# Patient Record
Sex: Female | Born: 1970 | Race: Black or African American | Hispanic: No | Marital: Single | Smoking: Current every day smoker
Health system: Southern US, Community
[De-identification: ages and names within clinical notes are randomized; demographics above are authoritative.]

## PROBLEM LIST (undated history)

## (undated) ENCOUNTER — Emergency Department (HOSPITAL_COMMUNITY): Admission: EM | Payer: Self-pay | Source: Home / Self Care

## (undated) DIAGNOSIS — C50919 Malignant neoplasm of unspecified site of unspecified female breast: Secondary | ICD-10-CM

## (undated) DIAGNOSIS — G459 Transient cerebral ischemic attack, unspecified: Secondary | ICD-10-CM

## (undated) DIAGNOSIS — Z609 Problem related to social environment, unspecified: Secondary | ICD-10-CM

## (undated) DIAGNOSIS — I5042 Chronic combined systolic (congestive) and diastolic (congestive) heart failure: Secondary | ICD-10-CM

## (undated) DIAGNOSIS — N182 Chronic kidney disease, stage 2 (mild): Secondary | ICD-10-CM

## (undated) DIAGNOSIS — I4892 Unspecified atrial flutter: Secondary | ICD-10-CM

## (undated) DIAGNOSIS — F1490 Cocaine use, unspecified, uncomplicated: Secondary | ICD-10-CM

## (undated) DIAGNOSIS — J449 Chronic obstructive pulmonary disease, unspecified: Secondary | ICD-10-CM

## (undated) DIAGNOSIS — Z5900 Homelessness unspecified: Secondary | ICD-10-CM

## (undated) DIAGNOSIS — I10 Essential (primary) hypertension: Secondary | ICD-10-CM

## (undated) DIAGNOSIS — F1910 Other psychoactive substance abuse, uncomplicated: Secondary | ICD-10-CM

## (undated) DIAGNOSIS — Z659 Problem related to unspecified psychosocial circumstances: Secondary | ICD-10-CM

## (undated) HISTORY — PX: TONSILLECTOMY AND ADENOIDECTOMY: SUR1326

## (undated) HISTORY — DX: Malignant neoplasm of unspecified site of unspecified female breast: C50.919

---
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
## 1991-01-18 HISTORY — PX: TUBAL LIGATION: SHX77

## 1997-04-17 ENCOUNTER — Emergency Department (HOSPITAL_COMMUNITY): Admission: EM | Admit: 1997-04-17 | Discharge: 1997-04-17 | Payer: Self-pay | Admitting: Emergency Medicine

## 1999-01-12 ENCOUNTER — Emergency Department (HOSPITAL_COMMUNITY): Admission: EM | Admit: 1999-01-12 | Discharge: 1999-01-12 | Payer: Self-pay | Admitting: Emergency Medicine

## 1999-01-12 ENCOUNTER — Encounter: Payer: Self-pay | Admitting: Emergency Medicine

## 2004-03-19 ENCOUNTER — Emergency Department (HOSPITAL_COMMUNITY): Admission: EM | Admit: 2004-03-19 | Discharge: 2004-03-19 | Payer: Self-pay | Admitting: Emergency Medicine

## 2004-04-26 ENCOUNTER — Encounter: Admission: RE | Admit: 2004-04-26 | Discharge: 2004-04-26 | Payer: Self-pay | Admitting: Family Medicine

## 2004-04-28 ENCOUNTER — Encounter: Admission: RE | Admit: 2004-04-28 | Discharge: 2004-07-27 | Payer: Self-pay | Admitting: Family Medicine

## 2005-01-17 ENCOUNTER — Emergency Department (HOSPITAL_COMMUNITY): Admission: EM | Admit: 2005-01-17 | Discharge: 2005-01-17 | Payer: Self-pay | Admitting: Emergency Medicine

## 2005-01-17 HISTORY — PX: BREAST BIOPSY: SHX20

## 2005-09-17 HISTORY — PX: MASTECTOMY: SHX3

## 2006-04-18 ENCOUNTER — Emergency Department (HOSPITAL_COMMUNITY): Admission: EM | Admit: 2006-04-18 | Discharge: 2006-04-19 | Payer: Self-pay | Admitting: Emergency Medicine

## 2006-07-25 ENCOUNTER — Emergency Department (HOSPITAL_COMMUNITY): Admission: EM | Admit: 2006-07-25 | Discharge: 2006-07-25 | Payer: Self-pay | Admitting: Emergency Medicine

## 2006-09-10 ENCOUNTER — Emergency Department (HOSPITAL_COMMUNITY): Admission: EM | Admit: 2006-09-10 | Discharge: 2006-09-10 | Payer: Self-pay | Admitting: Emergency Medicine

## 2006-09-13 ENCOUNTER — Encounter: Admission: RE | Admit: 2006-09-13 | Discharge: 2006-09-13 | Payer: Self-pay | Admitting: Family Medicine

## 2006-09-19 ENCOUNTER — Encounter (INDEPENDENT_AMBULATORY_CARE_PROVIDER_SITE_OTHER): Payer: Self-pay | Admitting: Radiology

## 2006-09-19 ENCOUNTER — Encounter: Admission: RE | Admit: 2006-09-19 | Discharge: 2006-09-19 | Payer: Self-pay | Admitting: Family Medicine

## 2006-09-24 ENCOUNTER — Encounter: Admission: RE | Admit: 2006-09-24 | Discharge: 2006-09-24 | Payer: Self-pay | Admitting: Family Medicine

## 2006-09-28 ENCOUNTER — Encounter (INDEPENDENT_AMBULATORY_CARE_PROVIDER_SITE_OTHER): Payer: Self-pay | Admitting: General Surgery

## 2006-09-28 ENCOUNTER — Encounter: Admission: RE | Admit: 2006-09-28 | Discharge: 2006-09-28 | Payer: Self-pay | Admitting: General Surgery

## 2006-10-16 ENCOUNTER — Ambulatory Visit (HOSPITAL_COMMUNITY): Admission: RE | Admit: 2006-10-16 | Discharge: 2006-10-17 | Payer: Self-pay | Admitting: General Surgery

## 2006-10-16 ENCOUNTER — Encounter (INDEPENDENT_AMBULATORY_CARE_PROVIDER_SITE_OTHER): Payer: Self-pay | Admitting: General Surgery

## 2006-10-27 ENCOUNTER — Ambulatory Visit: Payer: Self-pay | Admitting: Oncology

## 2006-11-08 LAB — CBC WITH DIFFERENTIAL/PLATELET
Basophils Absolute: 0.1 10*3/uL (ref 0.0–0.1)
EOS%: 1.9 % (ref 0.0–7.0)
Eosinophils Absolute: 0.1 10*3/uL (ref 0.0–0.5)
HGB: 11.6 g/dL (ref 11.6–15.9)
LYMPH%: 37.6 % (ref 14.0–48.0)
MCH: 29 pg (ref 26.0–34.0)
MCV: 85.7 fL (ref 81.0–101.0)
MONO%: 8.5 % (ref 0.0–13.0)
NEUT#: 3.8 10*3/uL (ref 1.5–6.5)
Platelets: 491 10*3/uL — ABNORMAL HIGH (ref 145–400)
RBC: 4.01 10*6/uL (ref 3.70–5.32)
RDW: 12 % (ref 11.3–14.5)

## 2006-11-08 LAB — CANCER ANTIGEN 27.29: CA 27.29: 10 U/mL (ref 0–39)

## 2006-11-08 LAB — COMPREHENSIVE METABOLIC PANEL
AST: 47 U/L — ABNORMAL HIGH (ref 0–37)
Alkaline Phosphatase: 63 U/L (ref 39–117)
BUN: 16 mg/dL (ref 6–23)
Glucose, Bld: 109 mg/dL — ABNORMAL HIGH (ref 70–99)
Total Bilirubin: 0.3 mg/dL (ref 0.3–1.2)

## 2006-11-13 LAB — VITAMIN D PNL(25-HYDRXY+1,25-DIHY)-BLD
Vit D, 1,25-Dihydroxy: 55 pg/mL (ref 6–62)
Vit D, 25-Hydroxy: 12 ng/mL — ABNORMAL LOW (ref 30–89)

## 2006-11-15 ENCOUNTER — Ambulatory Visit: Payer: Self-pay | Admitting: Cardiology

## 2006-11-15 ENCOUNTER — Ambulatory Visit: Admission: RE | Admit: 2006-11-15 | Discharge: 2006-11-15 | Payer: Self-pay | Admitting: Oncology

## 2006-11-15 ENCOUNTER — Encounter: Payer: Self-pay | Admitting: Oncology

## 2006-11-21 ENCOUNTER — Ambulatory Visit (HOSPITAL_COMMUNITY): Admission: RE | Admit: 2006-11-21 | Discharge: 2006-11-21 | Payer: Self-pay | Admitting: Oncology

## 2006-12-06 LAB — CBC WITH DIFFERENTIAL/PLATELET
BASO%: 1.7 % (ref 0.0–2.0)
Eosinophils Absolute: 0.1 10*3/uL (ref 0.0–0.5)
LYMPH%: 43.1 % (ref 14.0–48.0)
MCHC: 34.4 g/dL (ref 32.0–36.0)
MONO#: 0.7 10*3/uL (ref 0.1–0.9)
MONO%: 9.6 % (ref 0.0–13.0)
NEUT#: 3.4 10*3/uL (ref 1.5–6.5)
Platelets: 429 10*3/uL — ABNORMAL HIGH (ref 145–400)
RBC: 4.12 10*6/uL (ref 3.70–5.32)
RDW: 15.1 % — ABNORMAL HIGH (ref 11.3–14.5)
WBC: 7.6 10*3/uL (ref 3.9–10.0)

## 2006-12-06 LAB — MORPHOLOGY

## 2006-12-06 LAB — CHCC SMEAR

## 2006-12-12 ENCOUNTER — Ambulatory Visit: Payer: Self-pay | Admitting: Oncology

## 2006-12-19 LAB — CBC WITH DIFFERENTIAL/PLATELET
BASO%: 0.9 % (ref 0.0–2.0)
EOS%: 1.2 % (ref 0.0–7.0)
HCT: 35.3 % (ref 34.8–46.6)
LYMPH%: 34.9 % (ref 14.0–48.0)
MCH: 28.5 pg (ref 26.0–34.0)
MCHC: 33.3 g/dL (ref 32.0–36.0)
NEUT%: 54.5 % (ref 39.6–76.8)
Platelets: 477 10*3/uL — ABNORMAL HIGH (ref 145–400)
RBC: 4.12 10*6/uL (ref 3.70–5.32)
WBC: 7.8 10*3/uL (ref 3.9–10.0)

## 2006-12-26 LAB — CBC WITH DIFFERENTIAL/PLATELET
BASO%: 0.8 % (ref 0.0–2.0)
EOS%: 1.3 % (ref 0.0–7.0)
MCH: 28.4 pg (ref 26.0–34.0)
MCHC: 33.7 g/dL (ref 32.0–36.0)
MONO#: 0.4 10*3/uL (ref 0.1–0.9)
RBC: 3.56 10*6/uL — ABNORMAL LOW (ref 3.70–5.32)
RDW: 11.7 % (ref 11.3–14.5)
WBC: 8.3 10*3/uL (ref 3.9–10.0)
lymph#: 2.6 10*3/uL (ref 0.9–3.3)

## 2007-01-02 LAB — CBC WITH DIFFERENTIAL/PLATELET
Basophils Absolute: 0.1 10*3/uL (ref 0.0–0.1)
EOS%: 0.4 % (ref 0.0–7.0)
Eosinophils Absolute: 0 10*3/uL (ref 0.0–0.5)
HGB: 11 g/dL — ABNORMAL LOW (ref 11.6–15.9)
MCH: 28.5 pg (ref 26.0–34.0)
MONO#: 1.1 10*3/uL — ABNORMAL HIGH (ref 0.1–0.9)
NEUT#: 7.4 10*3/uL — ABNORMAL HIGH (ref 1.5–6.5)
RDW: 12.4 % (ref 11.3–14.5)
WBC: 11.2 10*3/uL — ABNORMAL HIGH (ref 3.9–10.0)
lymph#: 2.6 10*3/uL (ref 0.9–3.3)

## 2007-01-16 LAB — CBC WITH DIFFERENTIAL/PLATELET
BASO%: 0.7 % (ref 0.0–2.0)
Eosinophils Absolute: 0 10*3/uL (ref 0.0–0.5)
MONO#: 1.6 10*3/uL — ABNORMAL HIGH (ref 0.1–0.9)
NEUT#: 12.1 10*3/uL — ABNORMAL HIGH (ref 1.5–6.5)
Platelets: 305 10*3/uL (ref 145–400)
RBC: 3.91 10*6/uL (ref 3.70–5.32)
RDW: 13.5 % (ref 11.3–14.5)
WBC: 16.1 10*3/uL — ABNORMAL HIGH (ref 3.9–10.0)
lymph#: 2.2 10*3/uL (ref 0.9–3.3)

## 2007-01-17 ENCOUNTER — Ambulatory Visit: Payer: Self-pay | Admitting: Oncology

## 2007-01-22 ENCOUNTER — Encounter: Admission: RE | Admit: 2007-01-22 | Discharge: 2007-01-22 | Payer: Self-pay | Admitting: Dentistry

## 2007-01-22 ENCOUNTER — Ambulatory Visit: Payer: Self-pay | Admitting: Dentistry

## 2007-01-23 LAB — CBC WITH DIFFERENTIAL/PLATELET
Eosinophils Absolute: 0 10*3/uL (ref 0.0–0.5)
HCT: 32.5 % — ABNORMAL LOW (ref 34.8–46.6)
HGB: 10.8 g/dL — ABNORMAL LOW (ref 11.6–15.9)
LYMPH%: 27.4 % (ref 14.0–48.0)
MONO#: 1.2 10*3/uL — ABNORMAL HIGH (ref 0.1–0.9)
NEUT#: 4.2 10*3/uL (ref 1.5–6.5)
NEUT%: 55.6 % (ref 39.6–76.8)
Platelets: 309 10*3/uL (ref 145–400)
WBC: 7.6 10*3/uL (ref 3.9–10.0)
lymph#: 2.1 10*3/uL (ref 0.9–3.3)

## 2007-01-30 LAB — CBC WITH DIFFERENTIAL/PLATELET
BASO%: 0.4 % (ref 0.0–2.0)
Basophils Absolute: 0 10*3/uL (ref 0.0–0.1)
EOS%: 0.3 % (ref 0.0–7.0)
HCT: 27.6 % — ABNORMAL LOW (ref 34.8–46.6)
HGB: 9.4 g/dL — ABNORMAL LOW (ref 11.6–15.9)
LYMPH%: 21.4 % (ref 14.0–48.0)
MCH: 29.1 pg (ref 26.0–34.0)
MCHC: 34.2 g/dL (ref 32.0–36.0)
MONO#: 0.3 10*3/uL (ref 0.1–0.9)
NEUT%: 75.1 % (ref 39.6–76.8)
Platelets: 425 10*3/uL — ABNORMAL HIGH (ref 145–400)
lymph#: 2.3 10*3/uL (ref 0.9–3.3)

## 2007-02-07 LAB — CBC WITH DIFFERENTIAL/PLATELET
BASO%: 0.7 % (ref 0.0–2.0)
Basophils Absolute: 0.1 10*3/uL (ref 0.0–0.1)
EOS%: 0.4 % (ref 0.0–7.0)
HCT: 31 % — ABNORMAL LOW (ref 34.8–46.6)
HGB: 10.4 g/dL — ABNORMAL LOW (ref 11.6–15.9)
LYMPH%: 13.4 % — ABNORMAL LOW (ref 14.0–48.0)
MCH: 29 pg (ref 26.0–34.0)
MCHC: 33.7 g/dL (ref 32.0–36.0)
MCV: 86 fL (ref 81.0–101.0)
MONO%: 10.7 % (ref 0.0–13.0)
NEUT%: 74.8 % (ref 39.6–76.8)
lymph#: 1.5 10*3/uL (ref 0.9–3.3)

## 2007-02-13 LAB — CBC WITH DIFFERENTIAL/PLATELET
BASO%: 0.3 % (ref 0.0–2.0)
Basophils Absolute: 0 10*3/uL (ref 0.0–0.1)
EOS%: 0.2 % (ref 0.0–7.0)
MCH: 29.4 pg (ref 26.0–34.0)
MCHC: 34.1 g/dL (ref 32.0–36.0)
MCV: 86.4 fL (ref 81.0–101.0)
MONO%: 0.8 % (ref 0.0–13.0)
RBC: 3.4 10*6/uL — ABNORMAL LOW (ref 3.70–5.32)
RDW: 15.3 % — ABNORMAL HIGH (ref 11.3–14.5)
lymph#: 1.5 10*3/uL (ref 0.9–3.3)

## 2007-02-13 LAB — CANCER ANTIGEN 27.29: CA 27.29: 17 U/mL (ref 0–39)

## 2007-02-13 LAB — COMPREHENSIVE METABOLIC PANEL
Albumin: 4.5 g/dL (ref 3.5–5.2)
CO2: 25 mEq/L (ref 19–32)
Glucose, Bld: 90 mg/dL (ref 70–99)
Sodium: 141 mEq/L (ref 135–145)
Total Bilirubin: 0.6 mg/dL (ref 0.3–1.2)
Total Protein: 7.1 g/dL (ref 6.0–8.3)

## 2007-02-27 ENCOUNTER — Emergency Department (HOSPITAL_COMMUNITY): Admission: EM | Admit: 2007-02-27 | Discharge: 2007-02-27 | Payer: Self-pay | Admitting: Emergency Medicine

## 2007-03-01 ENCOUNTER — Ambulatory Visit (HOSPITAL_COMMUNITY): Admission: RE | Admit: 2007-03-01 | Discharge: 2007-03-01 | Payer: Self-pay | Admitting: Dentistry

## 2007-03-01 ENCOUNTER — Ambulatory Visit: Payer: Self-pay | Admitting: Dentistry

## 2007-03-15 ENCOUNTER — Ambulatory Visit: Payer: Self-pay | Admitting: Oncology

## 2007-03-29 LAB — COMPREHENSIVE METABOLIC PANEL
ALT: 28 U/L (ref 0–35)
AST: 70 U/L — ABNORMAL HIGH (ref 0–37)
Albumin: 4.7 g/dL (ref 3.5–5.2)
BUN: 8 mg/dL (ref 6–23)
CO2: 25 mEq/L (ref 19–32)
Calcium: 9.5 mg/dL (ref 8.4–10.5)
Chloride: 102 mEq/L (ref 96–112)
Creatinine, Ser: 0.94 mg/dL (ref 0.40–1.20)
Potassium: 4.2 mEq/L (ref 3.5–5.3)

## 2007-03-29 LAB — CBC WITH DIFFERENTIAL/PLATELET
Basophils Absolute: 0.1 10*3/uL (ref 0.0–0.1)
EOS%: 3.8 % (ref 0.0–7.0)
Eosinophils Absolute: 0.2 10*3/uL (ref 0.0–0.5)
HCT: 37.8 % (ref 34.8–46.6)
HGB: 12.5 g/dL (ref 11.6–15.9)
MCH: 30.3 pg (ref 26.0–34.0)
MONO#: 0.5 10*3/uL (ref 0.1–0.9)
NEUT#: 2.6 10*3/uL (ref 1.5–6.5)
NEUT%: 48.8 % (ref 39.6–76.8)
RDW: 14.8 % — ABNORMAL HIGH (ref 11.3–14.5)
WBC: 5.3 10*3/uL (ref 3.9–10.0)
lymph#: 1.9 10*3/uL (ref 0.9–3.3)

## 2007-03-29 LAB — CANCER ANTIGEN 27.29: CA 27.29: 10 U/mL (ref 0–39)

## 2007-05-18 ENCOUNTER — Ambulatory Visit: Payer: Self-pay | Admitting: Oncology

## 2007-05-22 LAB — COMPREHENSIVE METABOLIC PANEL
BUN: 17 mg/dL (ref 6–23)
CO2: 24 mEq/L (ref 19–32)
Calcium: 9.4 mg/dL (ref 8.4–10.5)
Chloride: 105 mEq/L (ref 96–112)
Creatinine, Ser: 1.09 mg/dL (ref 0.40–1.20)
Glucose, Bld: 77 mg/dL (ref 70–99)
Total Bilirubin: 0.6 mg/dL (ref 0.3–1.2)

## 2007-05-22 LAB — CBC WITH DIFFERENTIAL/PLATELET
BASO%: 2 % (ref 0.0–2.0)
Basophils Absolute: 0.1 10*3/uL (ref 0.0–0.1)
HCT: 37.7 % (ref 34.8–46.6)
HGB: 12.6 g/dL (ref 11.6–15.9)
LYMPH%: 39.5 % (ref 14.0–48.0)
MCHC: 33.3 g/dL (ref 32.0–36.0)
MONO#: 0.6 10*3/uL (ref 0.1–0.9)
NEUT%: 47.3 % (ref 39.6–76.8)
Platelets: 452 10*3/uL — ABNORMAL HIGH (ref 145–400)
WBC: 5.4 10*3/uL (ref 3.9–10.0)
lymph#: 2.1 10*3/uL (ref 0.9–3.3)

## 2007-05-22 LAB — CANCER ANTIGEN 27.29: CA 27.29: 11 U/mL (ref 0–39)

## 2007-08-27 ENCOUNTER — Ambulatory Visit: Payer: Self-pay | Admitting: Oncology

## 2007-09-19 ENCOUNTER — Encounter: Admission: RE | Admit: 2007-09-19 | Discharge: 2007-09-19 | Payer: Self-pay | Admitting: Oncology

## 2007-11-08 ENCOUNTER — Emergency Department (HOSPITAL_COMMUNITY): Admission: EM | Admit: 2007-11-08 | Discharge: 2007-11-08 | Payer: Self-pay | Admitting: Emergency Medicine

## 2007-11-20 ENCOUNTER — Ambulatory Visit: Payer: Self-pay | Admitting: Oncology

## 2007-11-22 LAB — CBC WITH DIFFERENTIAL/PLATELET
Basophils Absolute: 0 10*3/uL (ref 0.0–0.1)
Eosinophils Absolute: 0 10*3/uL (ref 0.0–0.5)
HCT: 34.9 % (ref 34.8–46.6)
HGB: 11.6 g/dL (ref 11.6–15.9)
LYMPH%: 32 % (ref 14.0–48.0)
MCV: 88.4 fL (ref 81.0–101.0)
MONO#: 0.7 10*3/uL (ref 0.1–0.9)
NEUT#: 3.9 10*3/uL (ref 1.5–6.5)
NEUT%: 57.1 % (ref 39.6–76.8)
Platelets: 479 10*3/uL — ABNORMAL HIGH (ref 145–400)
WBC: 6.9 10*3/uL (ref 3.9–10.0)

## 2007-11-23 LAB — COMPREHENSIVE METABOLIC PANEL
BUN: 18 mg/dL (ref 6–23)
CO2: 25 mEq/L (ref 19–32)
Calcium: 10.4 mg/dL (ref 8.4–10.5)
Chloride: 104 mEq/L (ref 96–112)
Creatinine, Ser: 1.06 mg/dL (ref 0.40–1.20)

## 2007-11-23 LAB — CANCER ANTIGEN 27.29: CA 27.29: 9 U/mL (ref 0–39)

## 2007-11-29 LAB — URINALYSIS, MICROSCOPIC - CHCC
Bilirubin (Urine): NEGATIVE
Glucose: NEGATIVE g/dL
Leukocyte Esterase: NEGATIVE
WBC, UA: NEGATIVE (ref 0–2)

## 2007-12-01 LAB — URINE CULTURE

## 2008-03-11 ENCOUNTER — Ambulatory Visit: Payer: Self-pay | Admitting: Oncology

## 2008-03-11 LAB — CBC WITH DIFFERENTIAL/PLATELET
Basophils Absolute: 0.1 10*3/uL (ref 0.0–0.1)
EOS%: 1.1 % (ref 0.0–7.0)
Eosinophils Absolute: 0.1 10*3/uL (ref 0.0–0.5)
HGB: 12.1 g/dL (ref 11.6–15.9)
LYMPH%: 42.5 % (ref 14.0–49.7)
MCH: 28.7 pg (ref 25.1–34.0)
MCV: 82.7 fL (ref 79.5–101.0)
MONO%: 13.8 % (ref 0.0–14.0)
NEUT#: 1.9 10*3/uL (ref 1.5–6.5)
Platelets: 349 10*3/uL (ref 145–400)
RBC: 4.21 10*6/uL (ref 3.70–5.45)

## 2008-03-11 LAB — COMPREHENSIVE METABOLIC PANEL
AST: 236 U/L — ABNORMAL HIGH (ref 0–37)
Alkaline Phosphatase: 63 U/L (ref 39–117)
BUN: 13 mg/dL (ref 6–23)
Glucose, Bld: 102 mg/dL — ABNORMAL HIGH (ref 70–99)
Total Bilirubin: 0.5 mg/dL (ref 0.3–1.2)

## 2008-03-21 ENCOUNTER — Ambulatory Visit (HOSPITAL_COMMUNITY): Admission: RE | Admit: 2008-03-21 | Discharge: 2008-03-21 | Payer: Self-pay | Admitting: Oncology

## 2008-03-25 LAB — HEPATITIS PANEL, ACUTE
Hep A IgM: NEGATIVE
Hep B C IgM: NEGATIVE

## 2008-04-01 ENCOUNTER — Ambulatory Visit (HOSPITAL_COMMUNITY): Admission: RE | Admit: 2008-04-01 | Discharge: 2008-04-01 | Payer: Self-pay | Admitting: Neurosurgery

## 2008-05-19 ENCOUNTER — Ambulatory Visit: Payer: Self-pay | Admitting: Oncology

## 2008-05-21 LAB — CBC WITH DIFFERENTIAL/PLATELET
BASO%: 0.5 % (ref 0.0–2.0)
Basophils Absolute: 0 10*3/uL (ref 0.0–0.1)
Eosinophils Absolute: 0.1 10*3/uL (ref 0.0–0.5)
HCT: 34.6 % — ABNORMAL LOW (ref 34.8–46.6)
HGB: 11.8 g/dL (ref 11.6–15.9)
LYMPH%: 47.4 % (ref 14.0–49.7)
MCHC: 34.1 g/dL (ref 31.5–36.0)
MONO#: 0.7 10*3/uL (ref 0.1–0.9)
NEUT#: 2.3 10*3/uL (ref 1.5–6.5)
NEUT%: 39.2 % (ref 38.4–76.8)
Platelets: 347 10*3/uL (ref 145–400)
WBC: 5.9 10*3/uL (ref 3.9–10.3)
lymph#: 2.8 10*3/uL (ref 0.9–3.3)

## 2008-05-22 LAB — COMPREHENSIVE METABOLIC PANEL
ALT: 63 U/L — ABNORMAL HIGH (ref 0–35)
BUN: 11 mg/dL (ref 6–23)
CO2: 24 mEq/L (ref 19–32)
Calcium: 9.3 mg/dL (ref 8.4–10.5)
Chloride: 103 mEq/L (ref 96–112)
Creatinine, Ser: 0.94 mg/dL (ref 0.40–1.20)
Glucose, Bld: 95 mg/dL (ref 70–99)
Total Bilirubin: 0.6 mg/dL (ref 0.3–1.2)

## 2008-05-22 LAB — CANCER ANTIGEN 27.29: CA 27.29: 13 U/mL (ref 0–39)

## 2008-05-22 LAB — VITAMIN D 25 HYDROXY (VIT D DEFICIENCY, FRACTURES): Vit D, 25-Hydroxy: 10 ng/mL — ABNORMAL LOW (ref 30–89)

## 2008-09-19 ENCOUNTER — Encounter: Admission: RE | Admit: 2008-09-19 | Discharge: 2008-09-19 | Payer: Self-pay | Admitting: Oncology

## 2008-09-24 ENCOUNTER — Ambulatory Visit: Payer: Self-pay | Admitting: Oncology

## 2008-09-24 LAB — CBC WITH DIFFERENTIAL/PLATELET
BASO%: 0.5 % (ref 0.0–2.0)
EOS%: 0.8 % (ref 0.0–7.0)
LYMPH%: 35.9 % (ref 14.0–49.7)
MCH: 28.9 pg (ref 25.1–34.0)
MCHC: 34.4 g/dL (ref 31.5–36.0)
MONO#: 0.6 10*3/uL (ref 0.1–0.9)
Platelets: 340 10*3/uL (ref 145–400)
RBC: 4.22 10*6/uL (ref 3.70–5.45)
WBC: 5.9 10*3/uL (ref 3.9–10.3)
lymph#: 2.1 10*3/uL (ref 0.9–3.3)

## 2008-09-24 LAB — COMPREHENSIVE METABOLIC PANEL
ALT: 30 U/L (ref 0–35)
AST: 50 U/L — ABNORMAL HIGH (ref 0–37)
CO2: 25 mEq/L (ref 19–32)
Creatinine, Ser: 0.91 mg/dL (ref 0.40–1.20)
Sodium: 137 mEq/L (ref 135–145)
Total Bilirubin: 0.5 mg/dL (ref 0.3–1.2)
Total Protein: 7.6 g/dL (ref 6.0–8.3)

## 2008-09-24 LAB — LACTATE DEHYDROGENASE: LDH: 137 U/L (ref 94–250)

## 2008-09-25 LAB — RHEUMATOID FACTOR: Rhuematoid fact SerPl-aCnc: <20 IU/mL (ref 0–20)

## 2008-10-13 ENCOUNTER — Emergency Department (HOSPITAL_COMMUNITY): Admission: EM | Admit: 2008-10-13 | Discharge: 2008-10-13 | Payer: Self-pay | Admitting: Emergency Medicine

## 2008-11-07 ENCOUNTER — Ambulatory Visit: Payer: Self-pay | Admitting: Oncology

## 2008-11-11 LAB — CBC WITH DIFFERENTIAL/PLATELET
Eosinophils Absolute: 0.1 10*3/uL (ref 0.0–0.5)
MONO#: 0.7 10*3/uL (ref 0.1–0.9)
NEUT#: 3.2 10*3/uL (ref 1.5–6.5)
Platelets: 345 10*3/uL (ref 145–400)
RBC: 3.69 10*6/uL — ABNORMAL LOW (ref 3.70–5.45)
RDW: 14.8 % — ABNORMAL HIGH (ref 11.2–14.5)
WBC: 6.9 10*3/uL (ref 3.9–10.3)
lymph#: 2.9 10*3/uL (ref 0.9–3.3)

## 2008-11-11 LAB — COMPREHENSIVE METABOLIC PANEL
ALT: 87 U/L — ABNORMAL HIGH (ref 0–35)
Albumin: 3.4 g/dL — ABNORMAL LOW (ref 3.5–5.2)
CO2: 27 mEq/L (ref 19–32)
Glucose, Bld: 81 mg/dL (ref 70–99)
Potassium: 3.7 mEq/L (ref 3.5–5.3)
Sodium: 138 mEq/L (ref 135–145)
Total Protein: 6.5 g/dL (ref 6.0–8.3)

## 2008-11-11 LAB — CANCER ANTIGEN 27.29: CA 27.29: 13 U/mL (ref 0–39)

## 2009-03-02 ENCOUNTER — Emergency Department (HOSPITAL_COMMUNITY): Admission: EM | Admit: 2009-03-02 | Discharge: 2009-03-02 | Payer: Self-pay | Admitting: Emergency Medicine

## 2009-08-01 ENCOUNTER — Emergency Department (HOSPITAL_COMMUNITY): Admission: EM | Admit: 2009-08-01 | Discharge: 2009-08-01 | Payer: Self-pay | Admitting: Emergency Medicine

## 2009-08-16 ENCOUNTER — Emergency Department (HOSPITAL_COMMUNITY): Admission: EM | Admit: 2009-08-16 | Discharge: 2009-08-16 | Payer: Self-pay | Admitting: Emergency Medicine

## 2009-09-22 ENCOUNTER — Encounter: Admission: RE | Admit: 2009-09-22 | Discharge: 2009-09-22 | Payer: Self-pay | Admitting: Oncology

## 2009-10-20 ENCOUNTER — Ambulatory Visit: Payer: Self-pay | Admitting: Oncology

## 2009-11-18 LAB — COMPREHENSIVE METABOLIC PANEL
ALT: 37 U/L — ABNORMAL HIGH (ref 0–35)
AST: 41 U/L — ABNORMAL HIGH (ref 0–37)
Albumin: 3.5 g/dL (ref 3.5–5.2)
Calcium: 9.2 mg/dL (ref 8.4–10.5)
Chloride: 106 mEq/L (ref 96–112)
Potassium: 4.4 mEq/L (ref 3.5–5.3)
Total Protein: 6.6 g/dL (ref 6.0–8.3)

## 2009-11-18 LAB — CBC WITH DIFFERENTIAL/PLATELET
BASO%: 0.3 % (ref 0.0–2.0)
EOS%: 2.3 % (ref 0.0–7.0)
HGB: 11.3 g/dL — ABNORMAL LOW (ref 11.6–15.9)
MCH: 29.8 pg (ref 25.1–34.0)
MCHC: 33.6 g/dL (ref 31.5–36.0)
MONO#: 0.7 10*3/uL (ref 0.1–0.9)
RDW: 14.8 % — ABNORMAL HIGH (ref 11.2–14.5)
WBC: 5.7 10*3/uL (ref 3.9–10.3)
lymph#: 2.2 10*3/uL (ref 0.9–3.3)

## 2009-12-30 ENCOUNTER — Ambulatory Visit: Payer: Self-pay | Admitting: Oncology

## 2010-02-06 ENCOUNTER — Encounter: Payer: Self-pay | Admitting: Oncology

## 2010-02-07 ENCOUNTER — Encounter: Payer: Self-pay | Admitting: Oncology

## 2010-03-17 ENCOUNTER — Emergency Department (HOSPITAL_COMMUNITY): Payer: Self-pay

## 2010-03-17 ENCOUNTER — Emergency Department (HOSPITAL_COMMUNITY)
Admission: EM | Admit: 2010-03-17 | Discharge: 2010-03-17 | Disposition: A | Discharge status: Home / Self Care | Payer: Self-pay | Attending: Emergency Medicine | Admitting: Emergency Medicine

## 2010-03-17 DIAGNOSIS — Z853 Personal history of malignant neoplasm of breast: Secondary | ICD-10-CM | POA: Insufficient documentation

## 2010-03-17 DIAGNOSIS — R209 Unspecified disturbances of skin sensation: Secondary | ICD-10-CM | POA: Insufficient documentation

## 2010-03-17 DIAGNOSIS — Z9010 Acquired absence of unspecified breast and nipple: Secondary | ICD-10-CM | POA: Insufficient documentation

## 2010-03-17 DIAGNOSIS — R071 Chest pain on breathing: Secondary | ICD-10-CM | POA: Insufficient documentation

## 2010-03-17 DIAGNOSIS — B353 Tinea pedis: Secondary | ICD-10-CM | POA: Insufficient documentation

## 2010-04-03 LAB — POCT CARDIAC MARKERS: Myoglobin, poc: 39.3 ng/mL (ref 12–200)

## 2010-04-03 LAB — COMPREHENSIVE METABOLIC PANEL
Albumin: 3.4 g/dL — ABNORMAL LOW (ref 3.5–5.2)
BUN: 12 mg/dL (ref 6–23)
Calcium: 8.7 mg/dL (ref 8.4–10.5)
Chloride: 108 mEq/L (ref 96–112)
Creatinine, Ser: 0.79 mg/dL (ref 0.4–1.2)
Total Bilirubin: 0.8 mg/dL (ref 0.3–1.2)
Total Protein: 6.3 g/dL (ref 6.0–8.3)

## 2010-04-07 LAB — URINALYSIS, ROUTINE W REFLEX MICROSCOPIC
Glucose, UA: NEGATIVE mg/dL
Ketones, ur: NEGATIVE mg/dL
Protein, ur: NEGATIVE mg/dL

## 2010-04-07 LAB — DIFFERENTIAL
Basophils Relative: 0 % (ref 0–1)
Eosinophils Relative: 2 % (ref 0–5)
Lymphocytes Relative: 32 % (ref 12–46)
Monocytes Relative: 15 % — ABNORMAL HIGH (ref 3–12)
Neutro Abs: 2.5 10*3/uL (ref 1.7–7.7)

## 2010-04-07 LAB — CBC
HCT: 35.1 % — ABNORMAL LOW (ref 36.0–46.0)
MCV: 88.8 fL (ref 78.0–100.0)
RBC: 3.95 MIL/uL (ref 3.87–5.11)
WBC: 4.9 10*3/uL (ref 4.0–10.5)

## 2010-04-07 LAB — BASIC METABOLIC PANEL
BUN: 14 mg/dL (ref 6–23)
Chloride: 106 mEq/L (ref 96–112)
GFR calc Af Amer: >60 mL/min (ref >60)
Potassium: 4.5 mEq/L (ref 3.5–5.1)

## 2010-04-07 LAB — URINE MICROSCOPIC-ADD ON

## 2010-04-23 LAB — COMPREHENSIVE METABOLIC PANEL
ALT: 37 U/L — ABNORMAL HIGH (ref 0–35)
AST: 57 U/L — ABNORMAL HIGH (ref 0–37)
Albumin: 3.5 g/dL (ref 3.5–5.2)
Alkaline Phosphatase: 63 U/L (ref 39–117)
BUN: 13 mg/dL (ref 6–23)
CO2: 27 mEq/L (ref 19–32)
Calcium: 9.1 mg/dL (ref 8.4–10.5)
Chloride: 104 mEq/L (ref 96–112)
Creatinine, Ser: 0.88 mg/dL (ref 0.4–1.2)
GFR calc Af Amer: >60 mL/min (ref >60)
GFR calc non Af Amer: >60 mL/min (ref >60)
Glucose, Bld: 96 mg/dL (ref 70–99)
Potassium: 4 mEq/L (ref 3.5–5.1)
Sodium: 138 mEq/L (ref 135–145)
Total Bilirubin: 0.6 mg/dL (ref 0.3–1.2)
Total Protein: 6.8 g/dL (ref 6.0–8.3)

## 2010-04-23 LAB — DIFFERENTIAL
Eosinophils Absolute: 0.1 10*3/uL (ref 0.0–0.7)
Eosinophils Relative: 1 % (ref 0–5)
Lymphs Abs: 1.5 10*3/uL (ref 0.7–4.0)
Monocytes Absolute: 0.6 10*3/uL (ref 0.1–1.0)
Monocytes Relative: 14 % — ABNORMAL HIGH (ref 3–12)

## 2010-04-23 LAB — CBC
HCT: 36.5 % (ref 36.0–46.0)
Hemoglobin: 12 g/dL (ref 12.0–15.0)
MCHC: 33 g/dL (ref 30.0–36.0)
MCV: 89.4 fL (ref 78.0–100.0)
Platelets: 345 10*3/uL (ref 150–400)
RBC: 4.08 MIL/uL (ref 3.87–5.11)
RDW: 15.2 % (ref 11.5–15.5)
WBC: 4.4 10*3/uL (ref 4.0–10.5)

## 2010-04-23 LAB — PREGNANCY, URINE: Preg Test, Ur: NEGATIVE

## 2010-04-23 LAB — URINALYSIS, ROUTINE W REFLEX MICROSCOPIC
Nitrite: NEGATIVE
Specific Gravity, Urine: 1.02 (ref 1.005–1.030)
pH: 6 (ref 5.0–8.0)

## 2010-04-23 LAB — APTT: aPTT: 37 seconds (ref 24–37)

## 2010-04-23 LAB — LIPASE, BLOOD: Lipase: 21 U/L (ref 11–59)

## 2010-04-23 LAB — HEMOCCULT GUIAC POC 1CARD (OFFICE): Fecal Occult Bld: NEGATIVE

## 2010-04-27 ENCOUNTER — Other Ambulatory Visit: Payer: Self-pay | Admitting: Oncology

## 2010-04-27 ENCOUNTER — Encounter (HOSPITAL_BASED_OUTPATIENT_CLINIC_OR_DEPARTMENT_OTHER): Payer: Self-pay | Admitting: Oncology

## 2010-04-27 DIAGNOSIS — Z853 Personal history of malignant neoplasm of breast: Secondary | ICD-10-CM

## 2010-04-27 DIAGNOSIS — C50419 Malignant neoplasm of upper-outer quadrant of unspecified female breast: Secondary | ICD-10-CM

## 2010-04-27 DIAGNOSIS — Z170 Estrogen receptor positive status [ER+]: Secondary | ICD-10-CM

## 2010-04-27 DIAGNOSIS — B372 Candidiasis of skin and nail: Secondary | ICD-10-CM

## 2010-04-27 DIAGNOSIS — F17200 Nicotine dependence, unspecified, uncomplicated: Secondary | ICD-10-CM

## 2010-04-27 LAB — CBC WITH DIFFERENTIAL/PLATELET
Basophils Absolute: 0 10*3/uL (ref 0.0–0.1)
Eosinophils Absolute: 0.1 10*3/uL (ref 0.0–0.5)
HGB: 11.4 g/dL — ABNORMAL LOW (ref 11.6–15.9)
MCV: 88.2 fL (ref 79.5–101.0)
MONO%: 10.2 % (ref 0.0–14.0)
NEUT#: 3.1 10*3/uL (ref 1.5–6.5)
Platelets: 317 10*3/uL (ref 145–400)
RDW: 14.4 % (ref 11.2–14.5)

## 2010-04-27 LAB — COMPREHENSIVE METABOLIC PANEL
Albumin: 4 g/dL (ref 3.5–5.2)
Alkaline Phosphatase: 56 U/L (ref 39–117)
BUN: 9 mg/dL (ref 6–23)
Calcium: 9.5 mg/dL (ref 8.4–10.5)
Glucose, Bld: 92 mg/dL (ref 70–99)
Potassium: 3.9 mEq/L (ref 3.5–5.3)

## 2010-04-27 LAB — CANCER ANTIGEN 27.29: CA 27.29: 6 U/mL (ref 0–39)

## 2010-04-29 ENCOUNTER — Other Ambulatory Visit: Payer: Self-pay | Admitting: Oncology

## 2010-04-29 DIAGNOSIS — R58 Hemorrhage, not elsewhere classified: Secondary | ICD-10-CM

## 2010-06-01 NOTE — Op Note (Signed)
Sabrina Holland, Sabrina Holland             ACCOUNT NO.:  [id number]   MEDICAL RECORD NO.:  [id number]          PATIENT TYPE:  AMB   LOCATION:  SDS                          FACILITY:  [facility]   PHYSICIAN:  Ollen Gross. Vernell Morgans, M.D. DATE OF BIRTH:  [birth year]   DATE OF PROCEDURE:  10/16/2006  DATE OF DISCHARGE:                               OPERATIVE REPORT   PREOPERATIVE DIAGNOSIS:  Left breast cancer.   POSTOPERATIVE DIAGNOSIS:  Left breast cancer.   PROCEDURE:  Left mastectomy and sentinel node biopsy with injection of  blue dye.   SURGEON:  Ollen Gross. Vernell Morgans, M.D.   ANESTHESIA:  General endotracheal.   DESCRIPTION OF PROCEDURE:  After informed consent was obtained, the  patient was brought to the operating and placed in the supine position  on the operating table.  After induction of general anesthesia, the  patient's left breast and axilla were prepped with Betadine and draped  in the usual sterile manner.  The subareolar region was then infiltrated  with 2 mL of methylene blue, and 3 mL of injectable saline, and the  breast was massaged for several minutes.  Earlier in the day the patient  had undergone injection of 1 mCi of technetium sulfur colloid in the  subareolar area as well.   A NeoProbe device was then used to find a the area of increased  radioactivity in the left axilla.  A small transverse incision was then  made overlying this area with a 10-blade knife.  This incision was  carried down through the skin, and into the subcutaneous tissue sharply  with electrocautery until the axilla was entered.  Blunt dissection was  carried out in the axilla until a hot lymph node.  Two lymph nodes were  identified, using the NeoProbe, that were both hot; neither of them were  blue.  Each was dissected free with sharp Bovie dissection.  The  lymphatics were then clamped with hemostats, divided and ligated with 3-  0 Vicryl ties.  The ex vivo counts on both sentinel nodes were around  100.   These were sent to pathology for touch preps which were negative.  No other radioactivity or palpable adenopathy could be appreciated in  the axilla.  The deep layer of the wound was then closed with  interrupted 3-0 Vicryl stitches, and the skin was closed with a running  4-0 Monocryl subcuticular stitch.   Next, attention was turned to the breast.  The patient had a palpable  mass in the 12 o'clock position in the left breast.  An elliptical  incision was made around the nipple-areolar complex.  This was done with  a 10-blade knife.  This incision was carried down through the skin and  subcutaneous tissue sharply with the electrocautery.  Skin hooks were  then used to elevate the superior-and-inferior skin flaps anteriorly  towards the ceiling.  Thin skin flaps were then created sharply with the  electrocautery.  This dissection was carried down all the way to the  chest wall circumferentially.  Once this was accomplished, the breast  was removed from the pectoralis muscle  of the chest wall with the  pectoralis fascia.  Again, this was done sharply with the  electrocautery.   Once the breast was completely dissected free circumferentially and  removed from the chest wall, it was oriented with a Vicryl stitch on the  lateral aspect of the skin, and then sent to pathology for further  evaluation.  The wound was irrigated copious amounts of saline.  Hemostasis was achieved using the Bovie electrocautery.  A small stab  wound was made inferior to the operative bed with in the anterior  axillary line with a 15-blade knife.  A hemostat was brought through  this opening, and then used to bring a 19-French round Blake drain into  the wound.  The drain was anchored to the skin with a 3-0 nylon stitch.  The drain was placed along the chest wall.   The skin of the chest wall was then reapproximated in several areas with  interrupted 3-0 Vicryl stitches, and then the skin was closed with a   running 4-0 Monocryl subcuticular stitch.  Dermabond dressing was  applied.  A drain was placed to suction.  The patient tolerated the  procedure well.  At the end of the case all needle, sponge and  instrument counts were correct.  The patient was then awakened, taken to  recovery in stable condition.      Ollen Gross. Vernell Morgans, M.D.  Electronically Signed     PST/MEDQ  D:  10/16/2006  T:  10/16/2006  Job:  [id number]

## 2010-06-01 NOTE — Op Note (Signed)
Sabrina, Holland NO.:  [id number]   MEDICAL RECORD NO.:  [id number]          PATIENT TYPE:  EMS   LOCATION:  ED                           FACILITY:  [facility]   PHYSICIAN:  Charlynne Pander, D.D.S.DATE OF BIRTH:  [birth year]   DATE OF PROCEDURE:  03/01/2007  DATE OF DISCHARGE:  02/27/2007                               OPERATIVE REPORT   PREOPERATIVE DIAGNOSES:  1. Breast cancer.  2. Chemotherapy.  3. Chronic periodontitis.  4. Chronic apical periodontitis.  5. Multiple retained root segments.  6. Rampant dental caries.  7. Malocclusion.   POSTOPERATIVE DIAGNOSIS:  1. Breast cancer.  2. Chemotherapy.  3. Chronic periodontitis.  4. Chronic apical periodontitis.  5. Multiple retained root segments.  6. Rampant dental caries.  7. Malocclusion.   PROCEDURES:  1. Extraction of tooth numbers 1, 2, 11, 12, 14, 15, 16, 17, 18, 19,      23, 24, 25, 26, 29, 30, 31, and 32.  2. Four-quadrants alveoloplasty.  3. Scaling and root planing of the remaining dentition.   SURGEON:  Charlynne Pander, D.D.S.   ASSISTANT:  Public house manager (Sales executive).   ANESTHESIA:  General anesthesia via nasal endotracheal tube.   MEDICATIONS:  1. Ancef 1 g IV prior to invasive dental procedures.  2. Local anesthesia with a total utilization of 6 carpules each      containing 36 mg of Xylocaine with 0.018 mg of epinephrine as well      as 2 carpules each containing 9 mg of bupivacaine with 0.009 mg of      epinephrine.   SPECIMENS:  There were 18 teeth that were discarded.   CULTURES:  None.   DRAINS:  None.   COMPLICATIONS:  None.   ESTIMATED BLOOD LOSS:  100 ml.   FLUIDS:  1200 ml of lactated Ringer solution.   INDICATION:  The patient was recently diagnosed with breast cancer and  was undergoing active chemotherapy.  Dental consultation was requested  to rule out dental infection that would affect the patient's systemic  health while on active  chemotherapy.  The patient was examined and  treatment plan for multiple extractions with alveoloplasty and scaling  and root planing as indicated.  This treatment plan was formulated to  decrease the risks and complications associated with dental infections  from affecting the patient's systemic health.   OPERATIVE FINDINGS:  The patient was examined in operating room #6.  The  teeth were identified for extraction.  The patient was noted to be  affected by chronic periodontitis, apical periodontitis, multiple  retained root segments, rampant dental caries, and malocclusion.  The  aforementioned necessitated the removal of multiple teeth with  alveoloplasty and scaling and root planing of the remaining teeth.   DESCRIPTION OF PROCEDURE:  The patient was brought to the main operating  room #6.  The patient was then placed in a supine position on the  operating room table.  The patient was then intubated utilizing a nasal  endotracheal tube.  The patient was then prepped and draped in the usual  manner for a dental medicine  procedure.  A time out was performed, and  the patient was identified and procedures verified.  A throat pack was  placed at this time.  The oral cavity was then examined with findings  noted above.  The patient was then ready for the dental medicine  procedure as follows:   Local anesthesia was administered sequentially with a total utilization  of 6 carpules each containing 36 mg of Xylocaine with 0.018 mg of  epinephrine as well as 2 carpules each containing 9 mg of bupivacaine  with 0.009 mg of epinephrine.   The maxillary quadrant was first approached.  The patient was  anesthetized utilizing the Xylocaine with epinephrine via infiltration  method on both the buccal and palatal aspects.  A 15 blade incision was  then made from the maxillary right tuberosity and extended to the mesial  #3.  A surgical flap was then carefully reflected.  Appropriate amounts  of  buccal and interseptal bone were removed around the remaining roots  in the area of tooth numbers 1 and 2.  These roots were then elevated  and removed with a 150 forceps without complications.  Alveoplasty was  then performed utilizing rongeurs and bone file.  A distal wedge  procedure was performed.  The tissues were then further trimmed  appropriately.  The surgical site was then irrigated with copious  amounts of sterile saline.  The surgical site was then closed from the  maxillary right tuberosity and extended to the distal #3 utilizing 3-0  chromic gut suture in a continuous interrupted suture  technique x1.   At this point in time, the maxillary left quadrant was then approached.  The patient was anesthetized utilizing the Xylocaine with epinephrine  via infiltration methods.  A 15 blade incision was then made from the  distal of the maxillary left tuberosity and extended to the distal #10.  A surgical flap was then carefully reflected.  Appropriate amounts of  buccal and interseptal bone were removed around tooth numbers 11, 12,  14, 15, and 16.  These teeth were then subluxated with a series of  straight elevators.  Tooth numbers 14,15,16 were then removed with a 53L  forceps without complications.  Tooth numbers 11 and 12 were then  removed with a 150 forceps without complications.  Alveoplasty was then  performed utilizing rongeurs and bone file.  The tissues were  approximated and trimmed appropriately.  A distal wedge procedure was  performed to further trim the soft tissues appropriately. The surgical  site was then irrigated with copious amounts of sterile saline.  The  surgical site was then closed from the maxillary left  tuberosity and  extended to the distal of #13 utilizing 3-0 chromic gut suture in a  continuous interrupted suture technique x1.  Four interrupted sutures  were then placed to further close the surgical site in the area of tooth  #11 and 12  appropriately.   At this point, the mandibular quadrants were approached.  The patient  was given bilateral infraalveolar nerve blocks utilizing bupivacaine  with epinephrine.  Further infiltration was achieved utilizing the  Xylocaine with epinephrine.  The mandibular left quadrant was then  approached .  A 15 blade incision was then made from the distal #17 and  extended to the mesial of #20.  A surgical flap was then carefully  reflected.  The roots in the area of tooth #17 through 19 were then  elevated and removed with rongeured appropriately.  Alveoplasty was  then  performed utilizing rongeurs and bone file.  The tissues were  approximated and trimmed appropriately.  A distal wedge procedure was  then performed to further trim the soft tissues appropriately.  The  surgical site was then irrigated with copious amounts of sterile saline.  The surgical site was then closed from the distal #17and extended to the  distal of #20 utilizing 3-0 chromic gut suture  in a continuous  interrupted suture  technique x1.  The mandibular anterior teeth were  then approached.  These teeth were subluxated with a series of straight  elevators and removed with 151 forceps without complications.  Alveoplasty was then performed utilizing rongeurs and bone file  conservatively.  The surgical sites were then irrigated with copious  amounts of sterile saline.  The tissues were approximated and trimmed  appropriately. The suture site was then closed utilizing 6 interrupted  sutures from the mesial #22 and extended to the mesial #27  appropriately.  At this point in time, the mandibular right posterior  teeth were approached.  A 15 blade incision was then made from the  distal of #32 and extended to the mesial of #28.  A surgical flap was  then carefully reflected.  Appropriate amounts of buccal and interseptal  bone was removed around the remaining roots. These roots were then  elevated and removed with a  151 forceps without complications.  This  included tooth #29, 30, 31 and 32.  Alveoplasty was then performed  utilizing rongeurs and bone file. The surgical site was then irrigated  with copious amounts of sterile saline.  The surgical site was then  closed from the distal #17 and extended to the distal of #28 utilizing 3-  0 chromic gut suture in a continuous interrupted suture technique x1.   At this point in time, the remaining maxillary and  mandibular teeth  were then approached.  Scaling and root planing was performed utilizing  the Cavitron ultrasonic scaler.  A series of hand curettes were then  utilized to further remove the accretions and perform scaling and root  planing.  An appropriate amount of time was taken to perform adequate  scaling and root planing.  At this point in time, the entire mouth was  irrigated with copious amounts of sterile saline.  The patient was  examined for complications.  Seeing none, the dental medicine procedure  was deemed to be complete.  The throat pack was removed at this time.  A  series of 4x4 gauzes were placed in the mouth to aid hemostasis.  The  patient was then handed over to the anesthesia team for final  disposition.  After an appropriate amount of time, the patient was taken  to the postanesthesia care unit with stable vital signs in good  condition.  All counts were correct for the dental medicine procedure.  The patient will be given appropriate pain medication and will be seen  in approximately one week for evaluation for suture  removal.  The  patient will then need to follow up for dental restorations of the  existing dental caries.  The patient will then be referred to the  dentist of her choice for fabrication of upper and lower cast partial  dentures after adequate healing.      Charlynne Pander, D.D.S.  Electronically Signed     RFK/MEDQ  D:  03/01/2007  T:  03/02/2007  Job:  [id number]   cc:   Valentino Hue. Magrinat,  M.D.  Fax: [phone]

## 2010-06-21 ENCOUNTER — Other Ambulatory Visit (HOSPITAL_COMMUNITY): Payer: Self-pay

## 2010-09-24 ENCOUNTER — Ambulatory Visit
Admission: RE | Admit: 2010-09-24 | Discharge: 2010-09-24 | Disposition: A | Discharge status: Home / Self Care | Payer: No Typology Code available for payment source | Source: Ambulatory Visit | Attending: Oncology | Admitting: Oncology

## 2010-09-24 ENCOUNTER — Other Ambulatory Visit: Payer: Self-pay | Admitting: Oncology

## 2010-09-24 DIAGNOSIS — Z853 Personal history of malignant neoplasm of breast: Secondary | ICD-10-CM

## 2010-10-05 ENCOUNTER — Inpatient Hospital Stay (INDEPENDENT_AMBULATORY_CARE_PROVIDER_SITE_OTHER)
Admission: RE | Admit: 2010-10-05 | Discharge: 2010-10-05 | Disposition: A | Discharge status: Home / Self Care | Payer: No Typology Code available for payment source | Source: Ambulatory Visit | Attending: Family Medicine | Admitting: Family Medicine

## 2010-10-05 DIAGNOSIS — R05 Cough: Secondary | ICD-10-CM

## 2010-10-05 DIAGNOSIS — J069 Acute upper respiratory infection, unspecified: Secondary | ICD-10-CM

## 2010-10-08 LAB — PREGNANCY, URINE: Preg Test, Ur: NEGATIVE

## 2010-10-08 LAB — PROTIME-INR: Prothrombin Time: 14.5

## 2010-10-08 LAB — BASIC METABOLIC PANEL
BUN: 6
Chloride: 103
Creatinine, Ser: 0.86

## 2010-10-08 LAB — CBC
MCV: 87.2
Platelets: 603 — ABNORMAL HIGH
WBC: 5.6

## 2010-10-18 LAB — URINALYSIS, ROUTINE W REFLEX MICROSCOPIC
Ketones, ur: NEGATIVE
Nitrite: NEGATIVE
Protein, ur: NEGATIVE
Urobilinogen, UA: 0.2

## 2010-10-18 LAB — COMPREHENSIVE METABOLIC PANEL
AST: 38 — ABNORMAL HIGH
CO2: 24
Calcium: 8.9
Creatinine, Ser: 0.76
GFR calc Af Amer: >60
GFR calc non Af Amer: >60
Glucose, Bld: 110 — ABNORMAL HIGH

## 2010-10-18 LAB — POCT CARDIAC MARKERS
CKMB, poc: <1 — ABNORMAL LOW
Myoglobin, poc: 39.7
Troponin i, poc: <0.05

## 2010-10-28 LAB — DIFFERENTIAL
Basophils Absolute: 0
Lymphocytes Relative: 35
Monocytes Absolute: 0.7
Monocytes Relative: 10
Neutro Abs: 3.9
Neutrophils Relative %: 53

## 2010-10-28 LAB — COMPREHENSIVE METABOLIC PANEL
Albumin: 3.5
BUN: 11
Creatinine, Ser: 0.97
Total Bilirubin: 0.7
Total Protein: 6.2

## 2010-10-28 LAB — CBC
HCT: 33 — ABNORMAL LOW
MCV: 86.2
Platelets: 476 — ABNORMAL HIGH
RDW: 14.7 — ABNORMAL HIGH

## 2010-11-04 ENCOUNTER — Encounter (HOSPITAL_BASED_OUTPATIENT_CLINIC_OR_DEPARTMENT_OTHER): Payer: No Typology Code available for payment source | Admitting: Oncology

## 2010-11-04 ENCOUNTER — Other Ambulatory Visit: Payer: Self-pay | Admitting: Oncology

## 2010-11-04 DIAGNOSIS — R58 Hemorrhage, not elsewhere classified: Secondary | ICD-10-CM

## 2010-11-04 DIAGNOSIS — Z170 Estrogen receptor positive status [ER+]: Secondary | ICD-10-CM

## 2010-11-04 DIAGNOSIS — F17200 Nicotine dependence, unspecified, uncomplicated: Secondary | ICD-10-CM

## 2010-11-04 DIAGNOSIS — N644 Mastodynia: Secondary | ICD-10-CM

## 2010-11-04 DIAGNOSIS — C50419 Malignant neoplasm of upper-outer quadrant of unspecified female breast: Secondary | ICD-10-CM

## 2010-11-08 ENCOUNTER — Other Ambulatory Visit: Payer: Self-pay | Admitting: Oncology

## 2010-11-08 ENCOUNTER — Encounter (HOSPITAL_COMMUNITY)
Admission: RE | Admit: 2010-11-08 | Discharge: 2010-11-08 | Disposition: A | Discharge status: Home / Self Care | Payer: Self-pay | Source: Ambulatory Visit | Attending: Oncology | Admitting: Oncology

## 2010-11-08 ENCOUNTER — Ambulatory Visit (HOSPITAL_COMMUNITY)
Admission: RE | Admit: 2010-11-08 | Discharge: 2010-11-08 | Disposition: A | Discharge status: Home / Self Care | Payer: Self-pay | Source: Ambulatory Visit | Attending: Oncology | Admitting: Oncology

## 2010-11-08 DIAGNOSIS — N939 Abnormal uterine and vaginal bleeding, unspecified: Secondary | ICD-10-CM | POA: Insufficient documentation

## 2010-11-08 DIAGNOSIS — N859 Noninflammatory disorder of uterus, unspecified: Secondary | ICD-10-CM | POA: Insufficient documentation

## 2010-11-08 DIAGNOSIS — Z79810 Long term (current) use of selective estrogen receptor modulators (SERMs): Secondary | ICD-10-CM | POA: Insufficient documentation

## 2010-11-08 DIAGNOSIS — Z9010 Acquired absence of unspecified breast and nipple: Secondary | ICD-10-CM | POA: Insufficient documentation

## 2010-11-08 DIAGNOSIS — R58 Hemorrhage, not elsewhere classified: Secondary | ICD-10-CM

## 2010-11-08 DIAGNOSIS — N926 Irregular menstruation, unspecified: Secondary | ICD-10-CM | POA: Insufficient documentation

## 2011-02-17 ENCOUNTER — Other Ambulatory Visit: Payer: Self-pay | Admitting: *Deleted

## 2011-02-17 ENCOUNTER — Telehealth: Payer: Self-pay | Admitting: *Deleted

## 2011-02-17 DIAGNOSIS — C50919 Malignant neoplasm of unspecified site of unspecified female breast: Secondary | ICD-10-CM

## 2011-02-17 DIAGNOSIS — C50419 Malignant neoplasm of upper-outer quadrant of unspecified female breast: Secondary | ICD-10-CM

## 2011-02-17 MED ORDER — TAMOXIFEN CITRATE 20 MG PO TABS: 20.0000 mg | ORAL_TABLET | Freq: Every day | ORAL | Status: DC

## 2011-02-17 MED ORDER — TAMOXIFEN CITRATE 20 MG PO TABS: 20.0000 mg | ORAL_TABLET | Freq: Every day | ORAL | Status: AC

## 2011-02-17 NOTE — Telephone Encounter (Signed)
Pt called stating onset of sensorium changes including Episodes of headaches, dizziness, speech changes and " falling out of bed ".  Per MD review recommendation is to obtian an MRI of the head with follow up appt.

## 2011-02-20 ENCOUNTER — Ambulatory Visit (HOSPITAL_COMMUNITY)
Admission: RE | Admit: 2011-02-20 | Discharge: 2011-02-20 | Disposition: A | Discharge status: Home / Self Care | Payer: Self-pay | Source: Ambulatory Visit | Attending: Oncology | Admitting: Oncology

## 2011-02-20 DIAGNOSIS — R930 Abnormal findings on diagnostic imaging of skull and head, not elsewhere classified: Secondary | ICD-10-CM | POA: Insufficient documentation

## 2011-02-20 DIAGNOSIS — C50919 Malignant neoplasm of unspecified site of unspecified female breast: Secondary | ICD-10-CM

## 2011-02-20 DIAGNOSIS — Q048 Other specified congenital malformations of brain: Secondary | ICD-10-CM | POA: Insufficient documentation

## 2011-02-20 DIAGNOSIS — R279 Unspecified lack of coordination: Secondary | ICD-10-CM | POA: Insufficient documentation

## 2011-02-20 DIAGNOSIS — R4789 Other speech disturbances: Secondary | ICD-10-CM | POA: Insufficient documentation

## 2011-02-20 DIAGNOSIS — J3489 Other specified disorders of nose and nasal sinuses: Secondary | ICD-10-CM | POA: Insufficient documentation

## 2011-02-20 DIAGNOSIS — R269 Unspecified abnormalities of gait and mobility: Secondary | ICD-10-CM | POA: Insufficient documentation

## 2011-02-20 MED ORDER — GADOBENATE DIMEGLUMINE 529 MG/ML IV SOLN
14.0000 mL | Freq: Once | INTRAVENOUS | Status: AC | PRN
  Administered 2011-02-20: 14 mL via INTRAVENOUS

## 2011-02-21 ENCOUNTER — Other Ambulatory Visit (HOSPITAL_COMMUNITY): Payer: Self-pay

## 2011-02-23 ENCOUNTER — Other Ambulatory Visit: Payer: Self-pay | Admitting: *Deleted

## 2011-02-23 ENCOUNTER — Ambulatory Visit (HOSPITAL_BASED_OUTPATIENT_CLINIC_OR_DEPARTMENT_OTHER): Payer: Self-pay | Admitting: Physician Assistant

## 2011-02-23 ENCOUNTER — Encounter: Payer: Self-pay | Admitting: Physician Assistant

## 2011-02-23 DIAGNOSIS — C50919 Malignant neoplasm of unspecified site of unspecified female breast: Secondary | ICD-10-CM

## 2011-02-23 DIAGNOSIS — J0130 Acute sphenoidal sinusitis, unspecified: Secondary | ICD-10-CM

## 2011-02-23 DIAGNOSIS — Z853 Personal history of malignant neoplasm of breast: Secondary | ICD-10-CM | POA: Insufficient documentation

## 2011-02-23 DIAGNOSIS — N898 Other specified noninflammatory disorders of vagina: Secondary | ICD-10-CM

## 2011-02-23 DIAGNOSIS — N939 Abnormal uterine and vaginal bleeding, unspecified: Secondary | ICD-10-CM

## 2011-02-23 DIAGNOSIS — B373 Candidiasis of vulva and vagina: Secondary | ICD-10-CM

## 2011-02-23 HISTORY — DX: Malignant neoplasm of unspecified site of unspecified female breast: C50.919

## 2011-02-23 MED ORDER — TAMOXIFEN CITRATE 20 MG PO TABS: 20.0000 mg | ORAL_TABLET | Freq: Every day | ORAL | Status: DC

## 2011-02-23 MED ORDER — AZITHROMYCIN 250 MG PO TABS: ORAL_TABLET | ORAL | Status: AC

## 2011-02-23 MED ORDER — FLUCONAZOLE 150 MG PO TABS: 150.0000 mg | ORAL_TABLET | Freq: Every day | ORAL | Status: AC

## 2011-02-23 NOTE — Progress Notes (Signed)
Hematology and Oncology Follow Up Visit  Sabrina Holland [id number] 08/08/1970 41 y.o. 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  She did receive adjuvant chemotherapy consisting of 4 dose-dense cycles of doxorubicin and cyclophosphamide.  She has been on tamoxifen since January 2009.    Interim History:   The patient is seen today to go over most recent brain MRI obtained you to episodes of headaches, dizziness, and reported speech changes. She states these symptoms have been going on for about 3-4 weeks, she has had "sinus pressure". Quite a bit of drainage and she denies any purulence. She does have some postnasal drip. She has an occasional nonproductive cough. She denies any frank fevers but is easily chilled. She also has difficulty with hot flashes of note she is on tamoxifen 20 mg per day.  She did inquire about a transvaginal ultrasound that was obtained in late October 2012. This had been obtained due to some vaginal bleeding episodes, she states that she really doesn't have a period such as speed, but that she has a sensation of most of PMS type of symptom. She denies any vaginal spotting. She does not have a gynecologist. She admits that she does not have medical insurance.  A detailed review of systems is otherwise noncontributory as noted below.  Review of Systems: Constitutional:  feels well and fatigued Eyes: No complaints WUJ:WJXBJ congestion, nasal discharge and sore throat Cardiovascular: no chest pain or dyspnea on exertion Respiratory: positive for - cough Neurological: positive for - dizziness and occasional speech  changes. Dermatological: negative Gastrointestinal: no abdominal pain, change in bowel habits, or black or bloody stools Genito-Urinary: no dysuria, trouble voiding, or hematuria Hematological and Lymphatic: negative Breast: negative Musculoskeletal: negative Remaining ROS negative.   Medications:   I have reviewed the patient's current medications.  Current Outpatient Prescriptions  Medication Sig Dispense Refill  . azithromycin (ZITHROMAX Z-PAK) 250 MG tablet 2 tabs po on day 1, 1 tab po days 2-5 as directed.  6 each  0  . fluconazole (DIFLUCAN) 150 MG tablet Take 1 tablet (150 mg total) by mouth daily.  2 tablet  1    Allergies: No Known Allergies  Physical Exam: Filed Vitals:   02/23/11 0911  BP: 137/91  Pulse: 94  Temp: 98 F (36.7 C)   HEENT:  Sclerae anicteric, conjunctivae pink.  Oropharynx clear.  No mucositis or candidiasis.   Nodes:  No cervical, supraclavicular, or axillary lymphadenopathy palpated.  Breast Exam:  Deferred.   Lungs:  Clear to auscultation bilaterally.  No crackles, rhonchi, or wheezes.   Heart:  Regular rate and rhythm.   Abdomen:  Soft, nontender.  Positive bowel sounds.  No organomegaly or masses palpated.   Neuro:  Nonfocal, alert and oriented x 3.   Lab Results: No results found for this basename: WBC, HGB, HCT, MCV, PLT, neutroabs     Chemistry   No results found for this basename: NA, K, CL, CO2, BUN, CREATININE, GLU   No results found for this basename: CALCIUM, ALKPHOS, AST, ALT, BILITOT        Radiological Studies: 11/08/10 TRANSABDOMINAL AND TRANSVAGINAL ULTRASOUND OF PELVIS Technique:  Both transabdominal  and transvaginal ultrasound examinations of the pelvis were performed. Transabdominal technique was performed for global imaging of the pelvis including uterus, ovaries, adnexal regions, and pelvic cul-de-sac.  Comparison: CT abdomen pelvis 10/13/2008.   It was necessary to proceed with endovaginal exam following  the transabdominal exam to visualize the uterus, endometrium, ovaries and adnexal regions.  Findings:  Uterus: Measures 7.1 x 3.8 x 5.0 cm and is uniform in parenchymal echo texture.  Endometrium: Measures 9 mm.  There is some heterogeneity of the endometrial stripe.  A possible focal hyperechoic lesion, measuring 7 x 4 x 7 mm, is suggested.  Right ovary:  Measures 2.4 x 1.3 x 2.1 cm, negative.  Left ovary: Measures 3.1 x 1.6 x 3.0 cm, negative.  Other findings: No free fluid.  IMPRESSION: Mild endometrial heterogeneity with a possible focal hyperechoic lesion.  A polyp could have this appearance.  Original Report Authenticated By: Reyes Ivan, M.D. In in a seated one and a call   Mri Brain W Wo Contrast 02/20/2011  *RADIOLOGY REPORT*  Clinical Data: Breast cancer.  Episodes unsteady gait, feeling off balance, and slurred speech.  MRI HEAD WITHOUT AND WITH CONTRAST  Technique:  Multiplanar, multiecho pulse sequences of the brain and surrounding structures were obtained according to standard protocol without and with intravenous contrast  Contrast: 14mL MULTIHANCE GADOBENATE DIMEGLUMINE 529 MG/ML IV SOLN  Comparison: CT head without contrast 11/08/2007.  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  No acute infarct, hemorrhage, or mass lesion is present.  The ventricles are otherwise of normal size.  Mild periventricular and scattered subcortical white matter changes are present bilaterally. These are slightly advanced for age.  The postcontrast images demonstrate no pathologic enhancement. Flow is present in the major intracranial arteries.  A fluid level is present in the lateral recess of the right sphenoid sinus.  The paranasal sinuses and mastoid air cells are otherwise clear.  IMPRESSION:  1.  No acute intracranial abnormality or evidence for metastatic disease to the brain. 2.  Scattered white matter changes are slightly  advanced for age. This could be due to prior cancer therapy. The finding is nonspecific but can be seen in the setting of chronic microvascular ischemia, a demyelinating process such as multiple sclerosis, vasculitis, complicated migraine headaches, or as the sequelae of a prior infectious or inflammatory process. 3.  Right sphenoid sinus disease.  Original Report Authenticated By: Jamesetta Orleans. MATTERN, M.D.     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  She did receive adjuvant chemotherapy consisting of 4 dose-dense cycles of doxorubicin and cyclophosphamide.  She has been on tamoxifen since January 2009.  2.  Episodic vaginal bleeding with questionable finding on transvaginal ultrasound obtained October 2012. 3. episodes of headache, dizziness, and reported speech changes with MRI revealing evidence of a right sphenoid sinusitis but other date findings of scattered white matter changes felt to be related to either prior chemotherapy treatment, chronic microvascular ischemia, or as above per report.   Case has been reviewed with Dr. Darnelle Catalan.    Plan:  Ms. Garey Ham will stop tamoxifen at this point. We will  work on getting her referred to a gynecologist through the [facility] system for further workup. In the interim, we will plan followup in 3 months time with a transvaginal ultrasound to be obtained prior. Pertaining to her MRI findings, I will place her on a Zithromax Z-Pak along with use of Mucinex to treat the sphenoid sinusitis. This may alleviate her symptoms. If it does not, she was instructed to contact our office prior to her 3  month followup. She has a strong tendency for vaginal candidiasis with antibiotic therapy, therefore I have also prescribed Diflucan 150 mg tablets x2.  This plan was reviewed with the patient, who voices understanding and agreement.  She knows to call with any changes or problems.    Dierdre Mccalip T, PA-C 02/23/2011

## 2011-02-23 NOTE — Patient Instructions (Signed)
MRI reveals probable sinus infection on the R side.--this could easily be causing your dizziness and occasional headaches, therefore:  1. I will place you on an antibiotic called Zithromax, also, I encourage you to use Mucinex as directed on the box.  PUSH FLUIDS!!  2. Because you have a history of vaginal yeast issues, I have also prescribed Diflucan, take as directed.  3. If symptoms do not significantly improve over the next week, or worsen, please call us back.   We will plan routine follow-up in 3 months.

## 2011-03-01 ENCOUNTER — Other Ambulatory Visit (HOSPITAL_COMMUNITY): Payer: Self-pay

## 2011-03-14 ENCOUNTER — Other Ambulatory Visit: Payer: Self-pay | Admitting: *Deleted

## 2011-03-14 DIAGNOSIS — R58 Hemorrhage, not elsewhere classified: Secondary | ICD-10-CM

## 2011-03-15 ENCOUNTER — Other Ambulatory Visit (HOSPITAL_COMMUNITY): Payer: No Typology Code available for payment source

## 2011-03-22 ENCOUNTER — Other Ambulatory Visit (HOSPITAL_COMMUNITY): Payer: No Typology Code available for payment source

## 2011-03-22 ENCOUNTER — Ambulatory Visit (HOSPITAL_COMMUNITY)
Admission: RE | Admit: 2011-03-22 | Discharge: 2011-03-22 | Disposition: A | Discharge status: Home / Self Care | Payer: Self-pay | Source: Ambulatory Visit | Attending: Oncology | Admitting: Oncology

## 2011-03-22 DIAGNOSIS — Z79899 Other long term (current) drug therapy: Secondary | ICD-10-CM | POA: Insufficient documentation

## 2011-03-22 DIAGNOSIS — N898 Other specified noninflammatory disorders of vagina: Secondary | ICD-10-CM | POA: Insufficient documentation

## 2011-03-22 DIAGNOSIS — R58 Hemorrhage, not elsewhere classified: Secondary | ICD-10-CM

## 2011-03-22 DIAGNOSIS — N9489 Other specified conditions associated with female genital organs and menstrual cycle: Secondary | ICD-10-CM | POA: Insufficient documentation

## 2011-04-06 ENCOUNTER — Ambulatory Visit (HOSPITAL_COMMUNITY)
Admission: RE | Admit: 2011-04-06 | Discharge: 2011-04-06 | Disposition: A | Discharge status: Home / Self Care | Payer: Self-pay | Source: Ambulatory Visit | Attending: Oncology | Admitting: Oncology

## 2011-04-06 ENCOUNTER — Encounter (HOSPITAL_COMMUNITY): Payer: Self-pay | Admitting: *Deleted

## 2011-04-06 ENCOUNTER — Other Ambulatory Visit: Payer: Self-pay

## 2011-04-06 ENCOUNTER — Other Ambulatory Visit: Payer: Self-pay | Admitting: *Deleted

## 2011-04-06 ENCOUNTER — Emergency Department (HOSPITAL_COMMUNITY)
Admission: EM | Admit: 2011-04-06 | Discharge: 2011-04-06 | Discharge status: Against Medical Advice | Payer: Self-pay | Attending: Emergency Medicine | Admitting: Emergency Medicine

## 2011-04-06 ENCOUNTER — Ambulatory Visit (HOSPITAL_BASED_OUTPATIENT_CLINIC_OR_DEPARTMENT_OTHER): Payer: No Typology Code available for payment source | Admitting: Lab

## 2011-04-06 DIAGNOSIS — R079 Chest pain, unspecified: Secondary | ICD-10-CM | POA: Insufficient documentation

## 2011-04-06 DIAGNOSIS — Z853 Personal history of malignant neoplasm of breast: Secondary | ICD-10-CM | POA: Insufficient documentation

## 2011-04-06 DIAGNOSIS — R0789 Other chest pain: Secondary | ICD-10-CM | POA: Insufficient documentation

## 2011-04-06 DIAGNOSIS — C50919 Malignant neoplasm of unspecified site of unspecified female breast: Secondary | ICD-10-CM

## 2011-04-06 DIAGNOSIS — Z170 Estrogen receptor positive status [ER+]: Secondary | ICD-10-CM

## 2011-04-06 DIAGNOSIS — R110 Nausea: Secondary | ICD-10-CM | POA: Insufficient documentation

## 2011-04-06 DIAGNOSIS — R209 Unspecified disturbances of skin sensation: Secondary | ICD-10-CM | POA: Insufficient documentation

## 2011-04-06 DIAGNOSIS — R071 Chest pain on breathing: Secondary | ICD-10-CM | POA: Insufficient documentation

## 2011-04-06 DIAGNOSIS — F17200 Nicotine dependence, unspecified, uncomplicated: Secondary | ICD-10-CM

## 2011-04-06 DIAGNOSIS — C50419 Malignant neoplasm of upper-outer quadrant of unspecified female breast: Secondary | ICD-10-CM

## 2011-04-06 LAB — CBC WITH DIFFERENTIAL/PLATELET
Basophils Absolute: 0.1 10*3/uL (ref 0.0–0.1)
EOS%: 0.7 % (ref 0.0–7.0)
Eosinophils Absolute: 0.1 10*3/uL (ref 0.0–0.5)
HCT: 32.2 % — ABNORMAL LOW (ref 34.8–46.6)
HGB: 11 g/dL — ABNORMAL LOW (ref 11.6–15.9)
MCH: 28.9 pg (ref 25.1–34.0)
MONO#: 0.9 10*3/uL (ref 0.1–0.9)
NEUT#: 3.4 10*3/uL (ref 1.5–6.5)
NEUT%: 46.5 % (ref 38.4–76.8)
lymph#: 2.9 10*3/uL (ref 0.9–3.3)

## 2011-04-06 LAB — CANCER ANTIGEN 27.29: CA 27.29: 6 U/mL (ref 0–39)

## 2011-04-06 LAB — COMPREHENSIVE METABOLIC PANEL
Albumin: 3.7 g/dL (ref 3.5–5.2)
BUN: 14 mg/dL (ref 6–23)
CO2: 23 mEq/L (ref 19–32)
Calcium: 9.1 mg/dL (ref 8.4–10.5)
Chloride: 106 mEq/L (ref 96–112)
Creatinine, Ser: 0.87 mg/dL (ref 0.50–1.10)
Glucose, Bld: 138 mg/dL — ABNORMAL HIGH (ref 70–99)
Potassium: 4.3 mEq/L (ref 3.5–5.3)

## 2011-04-06 LAB — LIPID PANEL
Cholesterol: 163 mg/dL (ref 0–200)
HDL: 87 mg/dL (ref >39)
Triglycerides: 114 mg/dL (ref <150)

## 2011-04-06 NOTE — Progress Notes (Signed)
Pt came into this office and requested to see this RN.  Per inquiry Sabrina Holland states she has been having a deep pain in her right chest ( behind the breast ) that increases with deep breaths.  She also states " feeling like my whole body is thumping ".  Pt went to the ER this am where they did an EKG. She was told her heart was ok and she would have to wait to be seen in order of urgency.  Pt then proceeded to this office per above.  Plan at present is to obtain a cbc and cxr.

## 2011-04-06 NOTE — ED Notes (Signed)
Pt states she is having chest pain when she takes a deep breath. Pt states her body aches when she walks and feet feels numbness. Pt states she has had some nausea but no emesis

## 2011-04-28 ENCOUNTER — Other Ambulatory Visit: Payer: Self-pay | Admitting: Lab

## 2011-04-28 ENCOUNTER — Other Ambulatory Visit: Payer: No Typology Code available for payment source | Admitting: Lab

## 2011-04-28 ENCOUNTER — Other Ambulatory Visit: Payer: Self-pay | Admitting: *Deleted

## 2011-04-28 ENCOUNTER — Ambulatory Visit: Payer: Self-pay | Admitting: Physician Assistant

## 2011-04-28 ENCOUNTER — Ambulatory Visit: Payer: No Typology Code available for payment source | Admitting: Physician Assistant

## 2011-04-28 ENCOUNTER — Ambulatory Visit (HOSPITAL_BASED_OUTPATIENT_CLINIC_OR_DEPARTMENT_OTHER): Payer: No Typology Code available for payment source | Admitting: Physician Assistant

## 2011-04-28 ENCOUNTER — Encounter: Payer: Self-pay | Admitting: Physician Assistant

## 2011-04-28 VITALS — BP 127/84 | HR 88 | Temp 98.6°F | Ht 66.0 in | Wt 138.2 lb

## 2011-04-28 DIAGNOSIS — R102 Pelvic and perineal pain: Secondary | ICD-10-CM

## 2011-04-28 DIAGNOSIS — C50419 Malignant neoplasm of upper-outer quadrant of unspecified female breast: Secondary | ICD-10-CM

## 2011-04-28 DIAGNOSIS — C50919 Malignant neoplasm of unspecified site of unspecified female breast: Secondary | ICD-10-CM

## 2011-04-28 DIAGNOSIS — C50912 Malignant neoplasm of unspecified site of left female breast: Secondary | ICD-10-CM | POA: Insufficient documentation

## 2011-04-28 DIAGNOSIS — N949 Unspecified condition associated with female genital organs and menstrual cycle: Secondary | ICD-10-CM

## 2011-04-28 DIAGNOSIS — Z1231 Encounter for screening mammogram for malignant neoplasm of breast: Secondary | ICD-10-CM

## 2011-04-28 LAB — COMPREHENSIVE METABOLIC PANEL
AST: 77 U/L — ABNORMAL HIGH (ref 0–37)
Albumin: 3.3 g/dL — ABNORMAL LOW (ref 3.5–5.2)
Alkaline Phosphatase: 83 U/L (ref 39–117)
BUN: 18 mg/dL (ref 6–23)
Calcium: 9.3 mg/dL (ref 8.4–10.5)
Chloride: 100 mEq/L (ref 96–112)
Creatinine, Ser: 1.12 mg/dL — ABNORMAL HIGH (ref 0.50–1.10)
Glucose, Bld: 114 mg/dL — ABNORMAL HIGH (ref 70–99)
Potassium: 3.8 mEq/L (ref 3.5–5.3)

## 2011-04-28 LAB — CBC WITH DIFFERENTIAL/PLATELET
BASO%: 0.8 % (ref 0.0–2.0)
Basophils Absolute: 0 10*3/uL (ref 0.0–0.1)
EOS%: 2.1 % (ref 0.0–7.0)
HCT: 35.2 % (ref 34.8–46.6)
HGB: 11.6 g/dL (ref 11.6–15.9)
LYMPH%: 45.8 % (ref 14.0–49.7)
MCH: 29.5 pg (ref 25.1–34.0)
MCHC: 32.9 g/dL (ref 31.5–36.0)
NEUT%: 36.4 % — ABNORMAL LOW (ref 38.4–76.8)
Platelets: 317 10*3/uL (ref 145–400)
lymph#: 2.6 10*3/uL (ref 0.9–3.3)

## 2011-04-28 LAB — LIPID PANEL
Cholesterol: 205 mg/dL — ABNORMAL HIGH (ref 0–200)
HDL: 88 mg/dL (ref >39)
Triglycerides: 353 mg/dL — ABNORMAL HIGH (ref <150)

## 2011-04-28 MED ORDER — LETROZOLE 2.5 MG PO TABS
2.5000 mg | ORAL_TABLET | Freq: Every day | ORAL | Status: DC
Start: 2011-04-28 — End: 2011-07-18

## 2011-04-28 NOTE — Progress Notes (Signed)
ID: Sabrina Holland   DOB: [birth year]  MR#: [id number]  JYN#:[id number]  HISTORY OF PRESENT ILLNESS: The patient presented to Urgent Care at [facility] with a complaint of toe pain.  She had palpated a lump in her left breast and she was set up for mammography at the [facility] September 13, 2006.  The mammograms found an ill-defined mass in the upper left breast associated with calcifications.  The right breast was negative.  The left breast ultrasound found the mass to be approximately 2.6 cm, irregular and hypoechoic.  There were no obvious enlarged lymph nodes.  Biopsy was performed on September 19, 2006 and this showed ([id number]) and ([id number]) an invasive ductal carcinoma that was 96% ER positive, 10% PR positive, 0 for HercepTest with the proliferation marker elevated at 25%.  With this information the patient was referred to [name] and on September 7 bilateral breast MRIs were obtained.  This showed in the upper inner quadrant of the left breast a 2.5 cm enhancing mass and then more medial and inferior to that a 7 mm well-circumscribed nodule felt to likely represent an intramammary node.  In the right breast, there was a 7 mm nodule felt possibly to represent a small intramammary lymph node.  Accordingly, the patient had again bilateral breast ultrasounds on September 11.  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  The left breast axillary mass was also located, but the patient stated that she had decided she was going to have a left mastectomy and therefore this was not biopsied.  Accordingly on October 16, 2006, the patient underwent left mastectomy with sentinel lymph node biopsy, both sentinel lymph nodes were negative.  There was also an intramammary lymph node, which was negative.  The breast mass itself measured 2.7 cm.  It was a grade 3 invasive ductal carcinoma with evidence of lymphovascular invasion, but negative  margins.  An Oncotype DX recurrence score of 32 predicted a 25% risk of recurrence within 10 years ago she did take tamoxifen for 5 years. Patient proceeded to adjuvant chemotherapy consisting of 4 dose dense cycles of doxorubicin/cyclophosphamide. She was started on tamoxifen in January 2009.  Patient had an episode of abnormal vaginal bleeding, in addition to pelvic pain, and tamoxifen was discontinued in March of 2013.  INTERVAL HISTORY: Sabrina Holland returns today accompanied by her daughter for routine six-month followup of her left breast carcinoma. Interval history is remarkable for having recently had a pelvic ultrasound to evaluate abnormal bleeding, along with pelvic pain. The endometrial stripe was slightly heterogeneous, but within normal limits in width. The patient has had no abnormal bleeding for almost 12 months now. She's had no menstrual cycle since approximately October of 2008. She does continue to have pelvic pain and a bloating sensation. She has not had a routine pelvic/Pap smear recently.  Otherwise, [name] continues to work at [employer] at [employer]. Interim history is otherwise unremarkable.    REVIEW OF SYSTEMS: Kerrilyn  denies any recent illnesses and has had no fevers, chills, or night sweats. She does have hot flashes. She has some pain in the lower extremities with occasional cramping in the legs. No problems with swelling. She's eating and drinking well, with no nausea or change in bowel habits. No chest pain, cough, or shortness of breath. No abnormal headaches, dizziness, or change in vision. No additional pain elsewhere.  A detailed review of systems is otherwise noncontributory.  PAST MEDICAL HISTORY: Past Medical History  Diagnosis Date  . Cancer     PAST SURGICAL HISTORY: Past Surgical History  Procedure Date  . Mastectomy   . Tubal ligation     FAMILY HISTORY No family history on file. There is no history of breast or ovarian cancer in the family  to her knowledge.  GYNECOLOGIC HISTORY:  She is GX, P4, first pregnancy age 87, last menstrual period October 2008.  SOCIAL HISTORY:  She works at [employer].  She has Medicaid, however.  She is single; at home there is her fianc [name] who works at [employer].   ADVANCED DIRECTIVES:  HEALTH MAINTENANCE: History  Substance Use Topics  . Smoking status: Current Everyday Smoker  . Smokeless tobacco: Not on file  . Alcohol Use: Yes     beer     Colonoscopy:  PAP:  Bone density:  Lipid panel:  No Known Allergies  Current Outpatient Prescriptions  Medication Sig Dispense Refill  . letrozole (FEMARA) 2.5 MG tablet Take 1 tablet (2.5 mg total) by mouth daily.  30 tablet  11    OBJECTIVE: Filed Vitals:   04/28/11 1506  BP: 127/84  Pulse: 88  Temp: 98.6 F (37 C)     Body mass index is 22.31 kg/(m^2).    ECOG FS: 0   Physical Exam: HEENT:  Sclerae anicteric, conjunctivae pink.  Oropharynx clear.  No mucositis or candidiasis.   Nodes:  No cervical, supraclavicular, or axillary lymphadenopathy palpated.  Breast Exam:  Right breast is unremarkable, no masses, skin changes, or nipple inversion. Left breast is status post mastectomy, no suspicious nodularities or skin changes. No evidence of local recurrence in the chest wall.   Lungs:  Clear to auscultation bilaterally.  No crackles, rhonchi, or wheezes.   Heart:  Regular rate and rhythm.   Abdomen:  Soft,  mildly distended, nontender.  Positive bowel sounds.    Musculoskeletal:  No focal spinal tenderness to palpation.  Extremities:  Benign.  No peripheral edema or cyanosis.   Skin:  Benign.   Neuro:  Nonfocal.Alert and oriented x3.    LAB RESULTS: Lab Results  Component Value Date   WBC 5.6 04/28/2011   NEUTROABS 2.0 04/28/2011   HGB 11.6 04/28/2011   HCT 35.2 04/28/2011   MCV 89.7 04/28/2011   PLT 317 04/28/2011      Chemistry      Component Value Date/Time   NA 140 04/06/2011 1302   K 4.3  04/06/2011 1302   CL 106 04/06/2011 1302   CO2 23 04/06/2011 1302   BUN 14 04/06/2011 1302   CREATININE 0.87 04/06/2011 1302      Component Value Date/Time   CALCIUM 9.1 04/06/2011 1302   ALKPHOS 65 04/06/2011 1302   AST 67* 04/06/2011 1302   ALT 61* 04/06/2011 1302   BILITOT 0.7 04/06/2011 1302       Lab Results  Component Value Date   LABCA2 6 04/06/2011    STUDIES:  09/24/2010 DIGITAL SCREENING MAMMOGRAM WITH CAD:  The breast tissue is almost entirely fatty. No masses or malignant type calcifications are  identified.  Images were processed with CAD.  IMPRESSION:  No specific mammographic evidence of malignancy. Next screening mammogram is recommended in one  year.  A result letter of this screening mammogram will be mailed directly to the patient.  ASSESSMENT: Benign - BI-RADS 2  Screening mammogram in 1 year.   03/22/2011 *RADIOLOGY REPORT*  Clinical Data: Abnormal bleeding; on  tamoxifen  TRANSABDOMINAL AND TRANSVAGINAL ULTRASOUND OF PELVIS  Technique: Both transabdominal and transvaginal ultrasound  examinations of the pelvis were performed. Transabdominal technique  was performed for global imaging of the pelvis including uterus,  ovaries, adnexal regions, and pelvic cul-de-sac.  Comparison: November 08, 2010  It was necessary to proceed with endovaginal exam following the  transabdominal exam to visualize the endometrium. .  Findings:  The uterus is normal in size and echotexture, measuring 8.8 x 3.7 x  4.1 cm. The endometrial stripe is slightly heterogeneous but  within normal limits in width, measuring 8.5 mm. No cysts or focal  lesions are identified today.  Both ovaries have a normal size and appearance. The right ovary  measures 2.1 x 1.5 x 2.2 cm. The left ovary measures 2.2 x 2.1 x  1.7 cm. There are no adnexal masses or free pelvic fluid.  IMPRESSION:  The endometrial stripe is slightly heterogeneous but within normal  limits in width, measuring 8.5 mm.  Tissue sampling should be  considered if there has been sustained, abnormal bleeding.  Original Report Authenticated By: Brandon Melnick, M.D.       ASSESSMENT: A soon to be 41 year old [place] woman   (1)  status post left mastectomy and sentinel lymph node dissection September 2008 for a T2 N0, grade 3, strongly estrogen receptor positive, moderately progesterone receptor positive, HER2 negative invasive ductal carcinoma with an Oncotype recurrence score of 32, predicting a 25% risk of recurrence within 10 years if all she did was take tamoxifen for 5 years.    (2)  She did receive adjuvant chemotherapy consisting of 4 dose-dense cycles of doxorubicin and cyclophosphamide.    (3)  on tamoxifen since January 2009, discontinued in March 2013, after an episode of abnormal vaginal bleeding.   PLAN: This case was reviewed with Dr. Darnelle Catalan, and he recommends switching from tamoxifen to an aromatase inhibitor such as letrozole. I did spend time reviewing possible side effects with the patient today, and she voices her understanding and agreement. She was given a prescription for letrozole, 2.5 mg daily, and we'll begin immediately.  Since we are switching therapies, she'll return to see me in 3 months to assess tolerance. If she is doing well, we will likely return to our 6 month followup schedule at that time.    She will be due for her next annual right mammogram in September. I am also sending a referral for her to be seen at the GYN clinic at [facility] for routine pelvic and Pap smear in light of her pelvic pain.  Teena knows to call with any changes or problems.   Tkai Serfass    04/28/2011

## 2011-04-29 ENCOUNTER — Telehealth: Payer: Self-pay | Admitting: *Deleted

## 2011-04-29 NOTE — Telephone Encounter (Signed)
left voice message to inform the patient of the new date and time for mammogram and bone density

## 2011-05-02 ENCOUNTER — Other Ambulatory Visit: Payer: Self-pay | Admitting: Physician Assistant

## 2011-05-02 NOTE — Progress Notes (Signed)
Patient was scheduled for 2 appts on the same day.  This appt was "canceled".

## 2011-05-04 ENCOUNTER — Telehealth: Payer: Self-pay | Admitting: *Deleted

## 2011-05-04 NOTE — Telephone Encounter (Signed)
called Olegario Messier at [facility] hospital at [phone] left voice message to inform her that the patient is in need of an pap smear and does not have insurance

## 2011-05-31 ENCOUNTER — Ambulatory Visit (INDEPENDENT_AMBULATORY_CARE_PROVIDER_SITE_OTHER): Payer: Self-pay | Admitting: *Deleted

## 2011-05-31 ENCOUNTER — Other Ambulatory Visit (HOSPITAL_COMMUNITY)
Admission: RE | Admit: 2011-05-31 | Discharge: 2011-05-31 | Disposition: A | Discharge status: Home / Self Care | Payer: Self-pay | Source: Ambulatory Visit | Attending: Obstetrics and Gynecology | Admitting: Obstetrics and Gynecology

## 2011-05-31 ENCOUNTER — Encounter: Payer: Self-pay | Admitting: *Deleted

## 2011-05-31 VITALS — BP 141/97 | HR 84 | Temp 97.6°F | Ht 66.0 in | Wt 133.7 lb

## 2011-05-31 DIAGNOSIS — N898 Other specified noninflammatory disorders of vagina: Secondary | ICD-10-CM

## 2011-05-31 DIAGNOSIS — Z01419 Encounter for gynecological examination (general) (routine) without abnormal findings: Secondary | ICD-10-CM

## 2011-05-31 NOTE — Patient Instructions (Signed)
Taught patient how to perform BSE.  Let her know [facility] will cover Pap smears every 3 years unless has a history of abnormal Pap smears.  Let patient know [facility] will cover her yearly mammogram in September and the importance of yearly mammograms. Let patient know will follow up with her within the next couple weeks with results. Patient verbalized understanding.

## 2011-05-31 NOTE — Progress Notes (Signed)
No complaints today.  Pap Smear:    Pap smear completed today. Per patient last Pap smear was 17-20 years ago and normal. Per patient has no history of abnormal Pap smears. Pap smear results above are in EPIC.  Physical exam: Breasts Patient refused Breast exam. Patient stated had a breast exam completed at the [facility] 04/28/11.       Pelvic/Bimanual   Ext Genitalia No lesions, no swelling and no discharge observed on external genitalia.         Vagina Vagina pink and normal texture. No lesions and thick white cottage cheese looking discharge observed in vagina. Wet prep completed.         Cervix Cervix is present. Cervix rough appearing and several nabothian cysts located next to cervical os. Cervix friable. Thick white cottage cheese looking discharge observed on cervix. Cervix tilted to the left.    Uterus Uterus is present and palpable. Uterus positioned to the left and enlarged.        Adnexae Bilateral ovaries present and palpable. No tenderness on palpation.          Rectovaginal No rectal exam completed today since patient had no rectal complaints. No skin abnormalities observed on exam.

## 2011-06-01 LAB — WET PREP, GENITAL

## 2011-06-06 ENCOUNTER — Encounter: Payer: Self-pay | Admitting: *Deleted

## 2011-07-01 ENCOUNTER — Telehealth: Payer: Self-pay | Admitting: *Deleted

## 2011-07-01 ENCOUNTER — Telehealth: Payer: Self-pay | Admitting: Medical Oncology

## 2011-07-01 ENCOUNTER — Other Ambulatory Visit: Payer: Self-pay | Admitting: Medical Oncology

## 2011-07-01 NOTE — Telephone Encounter (Signed)
patient confirmed over the phone the new date and time on 07-18-2011 starting at 10:30am with labs

## 2011-07-01 NOTE — Telephone Encounter (Signed)
Received call from patient requesting return call.  Spoke with patient " They took me off the Tamoxifen and put me on this other medication and I have started having my menstrual cycle again.  I haven't had a cycle in 4 years and I had one last week and then I woke up this morning and it started again.  I took myself off that medication, I can't deal with this."  Reviewed situation with Dr. Darnelle Catalan.  Returned call to patient with MD suggestions.  Per MD, bleeding is not related to Letrozole and patient should be evaluated by her GYN.  Patient is to follow up with Dr. Darnelle Catalan in the next 3 weeks.  Per MD, patient can continue taking Letrozole.  Patient stated " Im not taking that medicine cause I can't deal with this pain and bleeding.  I just had a pap smear and it was normal so I don't know why I need to go back to see them?"  Explained to patient that she needed to have her bleeding evaluated by a GYN as she was not having the bleeding when she had her pap smear, it is a new issue. Informed patient that she will be receiving a call from the schedulers with a new date and time to see Dr. Darnelle Catalan.  "Well I am not going to take this medicine anymore until I see him cause I really think this is what is causing my period to start. I really wanted him to put me back on the Tamoxifen and I am going to ask him when I see him."  Patient has no further questions at this time.  Oncology treatment plan sent to scheduling and they are to call patient with date and time of appointment.

## 2011-07-04 ENCOUNTER — Other Ambulatory Visit: Payer: No Typology Code available for payment source

## 2011-07-18 ENCOUNTER — Telehealth: Payer: Self-pay | Admitting: *Deleted

## 2011-07-18 ENCOUNTER — Other Ambulatory Visit: Payer: Self-pay | Admitting: Lab

## 2011-07-18 ENCOUNTER — Ambulatory Visit: Payer: Self-pay

## 2011-07-18 ENCOUNTER — Ambulatory Visit (HOSPITAL_BASED_OUTPATIENT_CLINIC_OR_DEPARTMENT_OTHER): Payer: Self-pay | Admitting: Oncology

## 2011-07-18 VITALS — BP 155/91 | HR 77 | Temp 98.1°F | Ht 66.0 in | Wt 130.5 lb

## 2011-07-18 DIAGNOSIS — C50912 Malignant neoplasm of unspecified site of left female breast: Secondary | ICD-10-CM

## 2011-07-18 DIAGNOSIS — C50419 Malignant neoplasm of upper-outer quadrant of unspecified female breast: Secondary | ICD-10-CM

## 2011-07-18 DIAGNOSIS — E538 Deficiency of other specified B group vitamins: Secondary | ICD-10-CM

## 2011-07-18 DIAGNOSIS — Z170 Estrogen receptor positive status [ER+]: Secondary | ICD-10-CM

## 2011-07-18 LAB — COMPREHENSIVE METABOLIC PANEL
ALT: 117 U/L — ABNORMAL HIGH (ref 0–35)
Alkaline Phosphatase: 79 U/L (ref 39–117)
CO2: 24 mEq/L (ref 19–32)
Sodium: 138 mEq/L (ref 135–145)
Total Bilirubin: 0.8 mg/dL (ref 0.3–1.2)
Total Protein: 5.8 g/dL — ABNORMAL LOW (ref 6.0–8.3)

## 2011-07-18 LAB — CBC WITH DIFFERENTIAL/PLATELET
Basophils Absolute: 0 10*3/uL (ref 0.0–0.1)
EOS%: 1 % (ref 0.0–7.0)
HCT: 35.7 % (ref 34.8–46.6)
HGB: 11.7 g/dL (ref 11.6–15.9)
MCH: 29.6 pg (ref 25.1–34.0)
NEUT%: 57.7 % (ref 38.4–76.8)
Platelets: 262 10*3/uL (ref 145–400)
lymph#: 1.9 10*3/uL (ref 0.9–3.3)

## 2011-07-18 MED ORDER — CYANOCOBALAMIN 1000 MCG/ML IJ SOLN
1000.0000 ug | Freq: Once | INTRAMUSCULAR | Status: AC
  Administered 2011-07-18: 1000 ug via INTRAMUSCULAR

## 2011-07-18 MED ORDER — TAMOXIFEN CITRATE 20 MG PO TABS: 20.0000 mg | ORAL_TABLET | Freq: Every day | ORAL | Status: AC

## 2011-07-18 NOTE — Telephone Encounter (Signed)
gave patient appointment for injections

## 2011-07-18 NOTE — Progress Notes (Signed)
ID: Sabrina Holland   DOB: May 26, 1970  MR#: [id number]  CSN#:[id number]  HISTORY OF PRESENT ILLNESS: The patient presented to Urgent Care at [facility] with a complaint of toe pain.  She had palpated a lump in her left breast and she was set up for mammography at the [facility] September 13, 2006.  The mammograms found an ill-defined mass in the upper left breast associated with calcifications.  The right breast was negative.  The left breast ultrasound found the mass to be approximately 2.6 cm, irregular and hypoechoic.  There were no obvious enlarged lymph nodes.  Biopsy was performed on September 19, 2006 and this showed ([id number]) and ([id number]) an invasive ductal carcinoma that was 96% ER positive, 10% PR positive, 0 for HercepTest with the proliferation marker elevated at 25%.  With this information the patient was referred to [name] and on September 7 bilateral breast MRIs were obtained.  This showed in the upper inner quadrant of the left breast a 2.5 cm enhancing mass and then more medial and inferior to that a 7 mm well-circumscribed nodule felt to likely represent an intramammary node.  In the right breast, there was a 7 mm nodule felt possibly to represent a small intramammary lymph node.  Accordingly, the patient had again bilateral breast ultrasounds on September 11.  The mass in the right breast was easily located and was biopsied that day, showing ([id number]) a fiber adenoma.  The left breast axillary mass was also located, but the patient stated that she had decided she was going to have a left mastectomy and therefore this was not biopsied.  On October 16, 2006, the patient underwent left mastectomy with sentinel lymph node biopsy, both sentinel lymph nodes were negative.  There was also an intramammary lymph node, which was negative.  The breast mass itself measured 2.7 cm.  It was a grade 3 invasive ductal carcinoma with evidence of lymphovascular invasion, but negative margins. Her  subsequent history is as detailed below  INTERVAL HISTORY: Sabrina Holland for followup of her breast cancer. The interval history is generally unremarkable. She doesn't work in the summer, so finances are a bit tight. On the other hand she is very excited with her 59-month-old granddaughter, who is her first.  REVIEW OF SYSTEMS: The biggest problem she has to report is intimacy issues with her significant other. It isn't simply lack of libido. It's more irritation This came on when we switched her to aromatase inhibitors. She also started menstruating again. She really did not do well with the pills, she feels, so she went ahead and stopped it. She was having some mild muscle aches also. Otherwise a detailed review of systems is unremarkable. She of course does all her housework, walks to the store, and walks her dog daily.  PAST MEDICAL HISTORY: Past Medical History  Diagnosis Date  . Cancer   status post tubal ligation, status post tonsillectomy and adenoidectomy and history of atypical chest pain evaluated in the emergency room April 18, 2006 by Dr. Estell Harpin.  The patient had an EKG showing normal sinus rhythm and a normal EKG and a negative chest x-ray at that time.  Her troponin was less than 0.05.  PAST SURGICAL HISTORY: Past Surgical History  Procedure Date  . Mastectomy   . Tubal ligation     FAMILY HISTORY Family History  Problem Relation Age of Onset  . Heart disease Mother   The patient's  parents are alive  She has two brothers  and one sister.  There is no history of breast or ovarian cancer in the family to her knowledge.  GYNECOLOGIC HISTORY: She is GX, P4, first pregnancy age 64. Was not menstruating on tamoxifen, resumed some menstruation on anastrozole.   SOCIAL HISTORY: She works at [employer] at [employer].  She has Medicaid, however.  She is single; at home there is her fianc [name] who works at [employer] and a son who is 20 and looking for a job and  two other children age 61 and 96.  Her 46 year old daughter is studying early childhood development at [employer].   ADVANCED DIRECTIVES:  HEALTH MAINTENANCE: History  Substance Use Topics  . Smoking status: Current Everyday Smoker  . Smokeless tobacco: Not on file  . Alcohol Use: Yes     beer     Colonoscopy:  PAP:  Bone density:  Lipid panel:  No Known Allergies  Current Outpatient Prescriptions  Medication Sig Dispense Refill  . Multiple Vitamin (MULITIVITAMIN WITH MINERALS) TABS Take 1 tablet by mouth daily.      . Cyanocobalamin (VITAMIN B-12 PO) Take 1 tablet by mouth as needed.      . naproxen sodium (ANAPROX) 220 MG tablet Take 220 mg by mouth as needed.      . tamoxifen (NOLVADEX) 20 MG tablet Take 1 tablet (20 mg total) by mouth daily.  90 tablet  12    OBJECTIVE: Filed Vitals:   07/18/11 1104  BP: 155/91  Pulse: 77  Temp: 98.1 F (36.7 Holland)     Body mass index is 21.06 kg/(m^2).    ECOG FS: 0  Sclerae unicteric Oropharynx clear No cervical or supraclavicular adenopathy Lungs no rales or rhonchi Heart regular rate and rhythm Abd benign MSK no focal spinal tenderness, no peripheral edema Neuro: nonfocal Breasts: The right breast is unremarkable. The left breast is status post mastectomy. There is no evidence of local recurrence.  LAB RESULTS: Lab Results  Component Value Date   WBC 6.0 07/18/2011   NEUTROABS 3.5 07/18/2011   HGB 11.7 07/18/2011   HCT 35.7 07/18/2011   MCV 90.6 07/18/2011   PLT 262 07/18/2011      Chemistry      Component Value Date/Time   NA 136 04/28/2011 1511   K 3.8 04/28/2011 1511   CL 100 04/28/2011 1511   CO2 27 04/28/2011 1511   BUN 18 04/28/2011 1511   CREATININE 1.12* 04/28/2011 1511      Component Value Date/Time   CALCIUM 9.3 04/28/2011 1511   ALKPHOS 83 04/28/2011 1511   AST 77* 04/28/2011 1511   ALT 61* 04/28/2011 1511   BILITOT 0.3 04/28/2011 1511       Lab Results  Component Value Date   LABCA2 18 04/28/2011    No components  found with this basename: [id number]    No results found for this basename: INR:1;PROTIME:1 in the last 168 hours  Urinalysis    Component Value Date/Time   COLORURINE YELLOW 03/02/2009 0856   APPEARANCEUR CLEAR 03/02/2009 0856   LABSPEC 1.021 03/02/2009 0856   LABSPEC 1.015 11/29/2007 1119   PHURINE 5.0 03/02/2009 0856   GLUCOSEU NEGATIVE 03/02/2009 0856   HGBUR NEGATIVE 03/02/2009 0856   BILIRUBINUR NEGATIVE 03/02/2009 0856   KETONESUR NEGATIVE 03/02/2009 0856   PROTEINUR NEGATIVE 03/02/2009 0856   UROBILINOGEN 0.2 03/02/2009 0856   NITRITE NEGATIVE 03/02/2009 0856   LEUKOCYTESUR SMALL* 03/02/2009 0856    STUDIES: TRANSABDOMINAL AND TRANSVAGINAL ULTRASOUND OF PELVIS  Technique: Both  transabdominal and transvaginal ultrasound  examinations of the pelvis were performed. Transabdominal technique  was performed for global imaging of the pelvis including uterus,  ovaries, adnexal regions, and pelvic cul-de-sac.  Comparison: November 08, 2010  It was necessary to proceed with endovaginal exam following the  transabdominal exam to visualize the endometrium. .  Findings:  The uterus is normal in size and echotexture, measuring 8.8 x 3.7 x  4.1 cm. The endometrial stripe is slightly heterogeneous but  within normal limits in width, measuring 8.5 mm. No cysts or focal  lesions are identified Holland.  Both ovaries have a normal size and appearance. The right ovary  measures 2.1 x 1.5 x 2.2 cm. The left ovary measures 2.2 x 2.1 x  1.7 cm. There are no adnexal masses or free pelvic fluid.  IMPRESSION:  The endometrial stripe is slightly heterogeneous but within normal  limits in width, measuring 8.5 mm. Tissue sampling should be  considered if there has been sustained, abnormal bleeding.  Original Report Authenticated By: Brandon Melnick, M.D.   ASSESSMENT: 41 y.o. [place] woman status post left mastectomy and sentinel lymph node dissection September 2008 for a T2 N0, grade 3, strongly estrogen  receptor positive, moderately progesterone receptor positive, HER2 negative invasive ductal carcinoma with an Oncotype recurrence score of 32, predicting a 25% risk of recurrence within 10 years if all she did was take tamoxifen for 5 years.   (1) status-post adjuvant chemotherapy consisting of 4 dose-dense cycles of doxorubicin and cyclophosphamide.  (2)  on tamoxifen between January 2009 and January 2013, when she was switched to letrozole, stopped due to side effects. Tamoxifen resumed 07/18/2011  PLAN: We went over the fact that now we have data to continue tamoxifen for a total of 10 years. I think this is going to be the drug for her. It did not interfere with her intimacy issues, and she did not have the arthralgias/myalgias she experienced with letrozole. I went ahead and wrote her the prescription. We also discussed the increased use of 4 planned in other ways of her being more welcoming and her significant other more patient. She tells me she can't afford her B12 medication and we are going to start giving her a once a month B12 shots for her history of B12 deficiency. She will see [place] again next February. She knows to call for any problems that may develop before then   Sabrina Holland    07/18/2011

## 2011-07-26 ENCOUNTER — Other Ambulatory Visit: Payer: No Typology Code available for payment source | Admitting: Lab

## 2011-08-02 ENCOUNTER — Ambulatory Visit: Payer: No Typology Code available for payment source | Admitting: Oncology

## 2011-08-15 ENCOUNTER — Telehealth: Payer: Self-pay | Admitting: Oncology

## 2011-08-15 ENCOUNTER — Ambulatory Visit (HOSPITAL_BASED_OUTPATIENT_CLINIC_OR_DEPARTMENT_OTHER): Payer: Self-pay

## 2011-08-15 VITALS — BP 134/88 | HR 70 | Temp 98.4°F

## 2011-08-15 DIAGNOSIS — C50912 Malignant neoplasm of unspecified site of left female breast: Secondary | ICD-10-CM

## 2011-08-15 DIAGNOSIS — E538 Deficiency of other specified B group vitamins: Secondary | ICD-10-CM

## 2011-08-15 MED ORDER — CYANOCOBALAMIN 1000 MCG/ML IJ SOLN
1000.0000 ug | Freq: Once | INTRAMUSCULAR | Status: AC
  Administered 2011-08-15: 1000 ug via INTRAMUSCULAR

## 2011-08-15 NOTE — Telephone Encounter (Signed)
Pt req that all inj appts be made for 8:00,all changed and printed for pt aom

## 2011-09-12 ENCOUNTER — Encounter: Payer: Self-pay | Admitting: Oncology

## 2011-09-12 ENCOUNTER — Ambulatory Visit (HOSPITAL_BASED_OUTPATIENT_CLINIC_OR_DEPARTMENT_OTHER): Payer: Self-pay

## 2011-09-12 ENCOUNTER — Ambulatory Visit: Payer: Self-pay

## 2011-09-12 DIAGNOSIS — C50912 Malignant neoplasm of unspecified site of left female breast: Secondary | ICD-10-CM

## 2011-09-12 DIAGNOSIS — E538 Deficiency of other specified B group vitamins: Secondary | ICD-10-CM

## 2011-09-12 MED ORDER — CYANOCOBALAMIN 1000 MCG/ML IJ SOLN
1000.0000 ug | Freq: Once | INTRAMUSCULAR | Status: AC
  Administered 2011-09-12: 1000 ug via INTRAMUSCULAR

## 2011-09-12 NOTE — Progress Notes (Signed)
Put fmla form on nurse's desk °

## 2011-09-16 ENCOUNTER — Encounter: Payer: Self-pay | Admitting: Oncology

## 2011-09-16 NOTE — Progress Notes (Signed)
Faxed fmla forms to [phone].

## 2011-09-22 ENCOUNTER — Other Ambulatory Visit: Payer: Self-pay | Admitting: *Deleted

## 2011-09-22 MED ORDER — TAMOXIFEN CITRATE 20 MG PO TABS: 20.0000 mg | ORAL_TABLET | Freq: Every day | ORAL | Status: AC

## 2011-09-26 ENCOUNTER — Ambulatory Visit (HOSPITAL_COMMUNITY): Payer: Self-pay

## 2011-09-28 ENCOUNTER — Telehealth (HOSPITAL_COMMUNITY): Payer: Self-pay | Admitting: *Deleted

## 2011-09-28 NOTE — Telephone Encounter (Signed)
Patient contacted me in regards to [facility] covering her screening mammogram. Explained to patient [facility] would cover. Told patient will schedule her an appointment and wither Sabrina or I will call her back. Patient verbalized understanding.

## 2011-09-29 ENCOUNTER — Other Ambulatory Visit: Payer: Self-pay | Admitting: Obstetrics and Gynecology

## 2011-09-29 ENCOUNTER — Telehealth (HOSPITAL_COMMUNITY): Payer: Self-pay | Admitting: *Deleted

## 2011-09-29 DIAGNOSIS — Z1231 Encounter for screening mammogram for malignant neoplasm of breast: Secondary | ICD-10-CM

## 2011-09-29 NOTE — Telephone Encounter (Signed)
Patient returned call and advised of mammogram scheduled at [facility] Tues Sept 24 2:45. Patient voiced understanding.

## 2011-10-10 ENCOUNTER — Ambulatory Visit: Payer: Self-pay

## 2011-10-10 ENCOUNTER — Ambulatory Visit (HOSPITAL_BASED_OUTPATIENT_CLINIC_OR_DEPARTMENT_OTHER): Payer: Self-pay

## 2011-10-10 VITALS — BP 152/94 | HR 83 | Temp 97.6°F

## 2011-10-10 DIAGNOSIS — E538 Deficiency of other specified B group vitamins: Secondary | ICD-10-CM

## 2011-10-10 DIAGNOSIS — C50912 Malignant neoplasm of unspecified site of left female breast: Secondary | ICD-10-CM

## 2011-10-10 MED ORDER — CYANOCOBALAMIN 1000 MCG/ML IJ SOLN
1000.0000 ug | Freq: Once | INTRAMUSCULAR | Status: AC
  Administered 2011-10-10: 1000 ug via INTRAMUSCULAR

## 2011-10-11 ENCOUNTER — Ambulatory Visit (HOSPITAL_COMMUNITY)
Admission: RE | Admit: 2011-10-11 | Discharge: 2011-10-11 | Disposition: A | Discharge status: Home / Self Care | Payer: Self-pay | Source: Ambulatory Visit | Attending: Obstetrics and Gynecology | Admitting: Obstetrics and Gynecology

## 2011-10-11 DIAGNOSIS — Z1231 Encounter for screening mammogram for malignant neoplasm of breast: Secondary | ICD-10-CM

## 2011-11-07 ENCOUNTER — Ambulatory Visit (HOSPITAL_BASED_OUTPATIENT_CLINIC_OR_DEPARTMENT_OTHER): Payer: Self-pay

## 2011-11-07 ENCOUNTER — Ambulatory Visit: Payer: Self-pay

## 2011-11-07 VITALS — BP 135/91 | HR 109 | Temp 97.6°F

## 2011-11-07 DIAGNOSIS — C50912 Malignant neoplasm of unspecified site of left female breast: Secondary | ICD-10-CM

## 2011-11-07 DIAGNOSIS — E538 Deficiency of other specified B group vitamins: Secondary | ICD-10-CM

## 2011-11-07 MED ORDER — CYANOCOBALAMIN 1000 MCG/ML IJ SOLN
1000.0000 ug | Freq: Once | INTRAMUSCULAR | Status: AC
  Administered 2011-11-07: 1000 ug via INTRAMUSCULAR

## 2011-11-25 ENCOUNTER — Emergency Department (HOSPITAL_COMMUNITY): Payer: Self-pay

## 2011-11-25 ENCOUNTER — Encounter (HOSPITAL_COMMUNITY): Payer: Self-pay | Admitting: *Deleted

## 2011-11-25 ENCOUNTER — Observation Stay (HOSPITAL_COMMUNITY): Payer: Self-pay

## 2011-11-25 ENCOUNTER — Observation Stay (HOSPITAL_COMMUNITY)
Admission: EM | Admit: 2011-11-25 | Discharge: 2011-11-26 | Disposition: A | Discharge status: Home / Self Care | Payer: 59 | Attending: Internal Medicine | Admitting: Internal Medicine

## 2011-11-25 DIAGNOSIS — R209 Unspecified disturbances of skin sensation: Secondary | ICD-10-CM | POA: Insufficient documentation

## 2011-11-25 DIAGNOSIS — C50912 Malignant neoplasm of unspecified site of left female breast: Secondary | ICD-10-CM

## 2011-11-25 DIAGNOSIS — Z853 Personal history of malignant neoplasm of breast: Secondary | ICD-10-CM | POA: Insufficient documentation

## 2011-11-25 DIAGNOSIS — J329 Chronic sinusitis, unspecified: Secondary | ICD-10-CM | POA: Diagnosis present

## 2011-11-25 DIAGNOSIS — R29898 Other symptoms and signs involving the musculoskeletal system: Secondary | ICD-10-CM | POA: Insufficient documentation

## 2011-11-25 DIAGNOSIS — C50919 Malignant neoplasm of unspecified site of unspecified female breast: Secondary | ICD-10-CM

## 2011-11-25 DIAGNOSIS — E538 Deficiency of other specified B group vitamins: Secondary | ICD-10-CM | POA: Insufficient documentation

## 2011-11-25 DIAGNOSIS — G459 Transient cerebral ischemic attack, unspecified: Principal | ICD-10-CM | POA: Insufficient documentation

## 2011-11-25 DIAGNOSIS — F1410 Cocaine abuse, uncomplicated: Secondary | ICD-10-CM | POA: Insufficient documentation

## 2011-11-25 DIAGNOSIS — R51 Headache: Secondary | ICD-10-CM | POA: Insufficient documentation

## 2011-11-25 DIAGNOSIS — Z9181 History of falling: Secondary | ICD-10-CM | POA: Insufficient documentation

## 2011-11-25 DIAGNOSIS — F1210 Cannabis abuse, uncomplicated: Secondary | ICD-10-CM | POA: Insufficient documentation

## 2011-11-25 DIAGNOSIS — F1910 Other psychoactive substance abuse, uncomplicated: Secondary | ICD-10-CM

## 2011-11-25 DIAGNOSIS — I10 Essential (primary) hypertension: Secondary | ICD-10-CM | POA: Diagnosis present

## 2011-11-25 HISTORY — DX: Transient cerebral ischemic attack, unspecified: G45.9

## 2011-11-25 HISTORY — DX: Malignant neoplasm of unspecified site of unspecified female breast: C50.919

## 2011-11-25 LAB — DIFFERENTIAL
Basophils Relative: 1 % (ref 0–1)
Eosinophils Absolute: 0.1 10*3/uL (ref 0.0–0.7)
Monocytes Absolute: 1 10*3/uL (ref 0.1–1.0)
Monocytes Relative: 11 % (ref 3–12)
Neutro Abs: 4.9 10*3/uL (ref 1.7–7.7)

## 2011-11-25 LAB — CBC
HCT: 33.8 % — ABNORMAL LOW (ref 36.0–46.0)
Hemoglobin: 11.6 g/dL — ABNORMAL LOW (ref 12.0–15.0)
MCH: 29.1 pg (ref 26.0–34.0)
MCHC: 34.3 g/dL (ref 30.0–36.0)
MCV: 84.9 fL (ref 78.0–100.0)
Platelets: 365 10*3/uL (ref 150–400)

## 2011-11-25 LAB — RAPID URINE DRUG SCREEN, HOSP PERFORMED
Amphetamines: NOT DETECTED
Barbiturates: NOT DETECTED
Opiates: NOT DETECTED

## 2011-11-25 LAB — COMPREHENSIVE METABOLIC PANEL
Albumin: 3.1 g/dL — ABNORMAL LOW (ref 3.5–5.2)
BUN: 11 mg/dL (ref 6–23)
Chloride: 101 mEq/L (ref 96–112)
Creatinine, Ser: 0.7 mg/dL (ref 0.50–1.10)
GFR calc Af Amer: >90 mL/min (ref >90)
Total Bilirubin: 0.3 mg/dL (ref 0.3–1.2)

## 2011-11-25 LAB — POCT I-STAT TROPONIN I

## 2011-11-25 LAB — TROPONIN I: Troponin I: <0.3 ng/mL (ref <0.30)

## 2011-11-25 MED ORDER — STROKE: EARLY STAGES OF RECOVERY BOOK
Freq: Once | Status: AC
  Administered 2011-11-25: 12:00:00
  Filled 2011-11-25 (×2): qty 1

## 2011-11-25 MED ORDER — ONDANSETRON HCL 4 MG/2ML IJ SOLN: 4.0000 mg | Freq: Once | INTRAMUSCULAR | Status: DC

## 2011-11-25 MED ORDER — HEPARIN SODIUM (PORCINE) 5000 UNIT/ML IJ SOLN
5000.0000 [IU] | Freq: Three times a day (TID) | INTRAMUSCULAR | Status: DC
  Administered 2011-11-25: 5000 [IU] via SUBCUTANEOUS
  Filled 2011-11-25 (×7): qty 1

## 2011-11-25 MED ORDER — AZITHROMYCIN 500 MG PO TABS
500.0000 mg | ORAL_TABLET | Freq: Every day | ORAL | Status: AC
  Administered 2011-11-25: 500 mg via ORAL
  Filled 2011-11-25: qty 1

## 2011-11-25 MED ORDER — CYANOCOBALAMIN 1000 MCG/ML IJ SOLN: 1000.0000 ug | INTRAMUSCULAR | Status: DC

## 2011-11-25 MED ORDER — ASPIRIN 325 MG PO TABS
325.0000 mg | ORAL_TABLET | Freq: Every day | ORAL | Status: DC
  Administered 2011-11-25 – 2011-11-26 (×2): 325 mg via ORAL
  Filled 2011-11-25 (×2): qty 1

## 2011-11-25 MED ORDER — HEPARIN SODIUM (PORCINE) 1000 UNIT/ML IJ SOLN
5000.0000 [IU] | Freq: Once | INTRAMUSCULAR | Status: AC
  Administered 2011-11-25: 5000 [IU] via INTRAVENOUS
  Filled 2011-11-25: qty 5

## 2011-11-25 MED ORDER — AMLODIPINE BESYLATE 5 MG PO TABS
5.0000 mg | ORAL_TABLET | Freq: Every day | ORAL | Status: DC
  Administered 2011-11-25 – 2011-11-26 (×2): 5 mg via ORAL
  Filled 2011-11-25 (×2): qty 1

## 2011-11-25 MED ORDER — FENTANYL CITRATE 0.05 MG/ML IJ SOLN: 50.0000 ug | Freq: Once | INTRAMUSCULAR | Status: DC

## 2011-11-25 MED ORDER — IOHEXOL 350 MG/ML SOLN
100.0000 mL | Freq: Once | INTRAVENOUS | Status: AC | PRN
  Administered 2011-11-25: 100 mL via INTRAVENOUS

## 2011-11-25 MED ORDER — ADULT MULTIVITAMIN W/MINERALS CH
1.0000 | ORAL_TABLET | Freq: Every day | ORAL | Status: DC
  Administered 2011-11-25 – 2011-11-26 (×2): 1 via ORAL
  Filled 2011-11-25 (×2): qty 1

## 2011-11-25 MED ORDER — NICOTINE 7 MG/24HR TD PT24
7.0000 mg | MEDICATED_PATCH | Freq: Every day | TRANSDERMAL | Status: DC
  Administered 2011-11-25 – 2011-11-26 (×2): 7 mg via TRANSDERMAL
  Filled 2011-11-25 (×2): qty 1

## 2011-11-25 MED ORDER — AZITHROMYCIN 250 MG PO TABS
250.0000 mg | ORAL_TABLET | Freq: Every day | ORAL | Status: DC
  Administered 2011-11-26: 250 mg via ORAL
  Filled 2011-11-25: qty 1

## 2011-11-25 MED ORDER — TRAMADOL HCL 50 MG PO TABS
50.0000 mg | ORAL_TABLET | Freq: Three times a day (TID) | ORAL | Status: DC
  Administered 2011-11-25 – 2011-11-26 (×4): 50 mg via ORAL
  Filled 2011-11-25 (×4): qty 1

## 2011-11-25 MED ORDER — MORPHINE SULFATE 2 MG/ML IJ SOLN: 2.0000 mg | INTRAMUSCULAR | Status: DC | PRN

## 2011-11-25 NOTE — ED Notes (Signed)
Pt c/o left sided numbness this am. Pt states she woke up this am with left sided numbness. Pt also states that she has been having a h/a for 2 days.

## 2011-11-25 NOTE — Progress Notes (Signed)
Utilization review completed.  

## 2011-11-25 NOTE — Progress Notes (Signed)
Stroke Team Progress Note  HISTORY Sabrina Holland is an 41 y.o. female with a history of breast cancer, presenting with new onset weakness involving left arm and left leg. She was last known normal at 2200 on 11/24/2011. Has no previous history of stroke nor TIA. She has not been on antiplatelet therapy. CT scan of her head showed no acute intracranial abnormality. CTA showed no signs of intracranial vascular occlusion. Patient was beyond time window for intravenous thrombolytic therapy with TPA by the time she presented to the emergency room. Her deficits subsequently resolved. NIH stroke score at this point is 0. Code stroke was canceled. She was admitted for further evaluation and treatment.  SUBJECTIVE Her daughters are  at the bedside.  Overall she feels her condition is improved Most recent Vital Signs: Filed Vitals:   11/25/11 0645 11/25/11 0700 11/25/11 0730 11/25/11 0800  BP:  142/102 149/96 147/94  Pulse: 83 85 83 88  Temp:      TempSrc:      Resp: 20 23 15 20   Height:      Weight:      SpO2: 100% 100% 100% 100%   CBG (last 3)  No results found for this basename: GLUCAP:3 in the last 72 hours  IV Fluid Intake:     MEDICATIONS    . amLODipine  5 mg Oral Daily  . aspirin  325 mg Oral Daily  . azithromycin  500 mg Oral Daily   Followed by  . azithromycin  250 mg Oral Daily  . cyanocobalamin  1,000 mcg Intramuscular Q30 days  . heparin  5,000 Units Subcutaneous Q8H  . multivitamin with minerals  1 tablet Oral Daily  . ondansetron  4 mg Intravenous Once  . [DISCONTINUED] fentaNYL  50 mcg Intravenous Once   PRN:  [COMPLETED] iohexol  Diet:  NPO  Activity:   Bathroom privileges with assistance DVT Prophylaxis:  Heparin 5000 units sq tid  CLINICALLY SIGNIFICANT STUDIES Basic Metabolic Panel:  Lab 11/25/11 1610  NA 137  K 3.9  CL 101  CO2 23  GLUCOSE 85  BUN 11  CREATININE 0.70  CALCIUM 9.4  MG --  PHOS --   Liver Function Tests:  Lab 11/25/11 0425  AST  53*  ALT 43*  ALKPHOS 99  BILITOT 0.3  PROT 6.9  ALBUMIN 3.1*   CBC:  Lab 11/25/11 0425  WBC 8.9  NEUTROABS 4.9  HGB 11.6*  HCT 33.8*  MCV 84.9  PLT 365   Coagulation:  Lab 11/25/11 0425  LABPROT 13.7  INR 1.06   Cardiac Enzymes:  Lab 11/25/11 0425  CKTOTAL --  CKMB --  CKMBINDEX --  TROPONINI <0.30   Urinalysis: No results found for this basename: COLORURINE:2,APPERANCEUR:2,LABSPEC:2,PHURINE:2,GLUCOSEU:2,HGBUR:2,BILIRUBINUR:2,KETONESUR:2,PROTEINUR:2,UROBILINOGEN:2,NITRITE:2,LEUKOCYTESUR:2 in the last 168 hours Lipid Panel    Component Value Date/Time   CHOL 205* 04/28/2011 1511   TRIG 353* 04/28/2011 1511   HDL 88 04/28/2011 1511   CHOLHDL 2.3 04/28/2011 1511   VLDL 71* 04/28/2011 1511   LDLCALC 46 04/28/2011 1511   HgbA1C  No results found for this basename: HGBA1C    Urine Drug Screen:     Component Value Date/Time   LABOPIA NONE DETECTED 11/25/2011 0719   COCAINSCRNUR POSITIVE* 11/25/2011 0719   LABBENZ NONE DETECTED 11/25/2011 0719   AMPHETMU NONE DETECTED 11/25/2011 0719   THCU POSITIVE* 11/25/2011 0719   LABBARB NONE DETECTED 11/25/2011 0719    Alcohol Level: No results found for this basename: ETH:2 in the last 168 hours  CT of the brain  11/25/2011 1.  No acute intracranial pathology seen on CT. 2.  Mild small vessel ischemic microangiopathy. 3.  Partial opacification of the right side of the sphenoid sinus.    CT Angio Head 11/25/2011  1.  No evidence of focal occlusion within the visualized intracranial vasculature.  The carotid and vertebrobasilar systems appear intact. 2.  Mild small vessel ischemic microangiopathy. 3.  Partial opacification of the right side of the sphenoid sinus.  MRI of the brain    MRA of the brain    2D Echocardiogram    Carotid Doppler    CXR    EKG SINUS TACHYCARDIA ~ V-rate> 99 PROBABLE LEFT ATRIAL ABNORMALITY ~ P >42mS, <-0.79mV V1 BORDERLINE T ABNORMALITIES, ANT-LAT LEADS ~ T flat/neg, I aVL V2-V6 Therapy Recommendations  PT -pending OT -pending; ST - pending  Physical Exam  Frail middle aged african american lady not in distress.Awake alert. Afebrile. Head is nontraumatic. Neck is supple without bruit. Hearing is normal. Cardiac exam no murmur or gallop. Lungs are clear to auscultation. Distal pulses are well felt.  Neurological exam ; Awake  Alert oriented x 3. Normal speech and language.eye movements full without nystagmus. Fundi not visualized. Vision acuity and fields are adequate.Face symmetric. Tongue midline. Normal strength, tone, reflexes and coordination. Normal sensation. Gait deferred.   ASSESSMENT Sabrina Holland is a 41 y.o. female presenting with transient weakness involving left arm and left leg- suspect right brain TIA. CT-scan of the  head negative. MRI pending.Work up underway. On no antiplatlets prior to admission. Now on aspirin 325 mg orally every day for secondary stroke prevention. Patient with resultant no residual deficits.     Hospital day # 0  TREATMENT/PLAN  Continue aspirin for secondary stroke prevention.  Continue stroke work up  D/w patient and daughter POINT trial participation but they are not interested.  Stroke Team will follow.  Annie Main, MSN, RN, ANVP-BC, ANP-BC, [facility] Pager: [phone] 11/25/2011 8:38 AM  Scribe for Dr. Delia Heady, Stroke Center Medical Director, who has personally reviewed chart, pertinent data, examined the patient and developed the plan of care. Pager:  [phone]

## 2011-11-25 NOTE — ED Notes (Signed)
Family at bedside. 

## 2011-11-25 NOTE — ED Notes (Signed)
Pt with MD at bedside. Code stroke called. Pt taken to CT. Will continue to monitor.

## 2011-11-25 NOTE — Progress Notes (Signed)
Echocardiogram 2D Echocardiogram has been performed.  Sabrina Holland 11/25/2011, 9:48 AM

## 2011-11-25 NOTE — H&P (Signed)
PCP:   Pcp Not In System   Chief Complaint:  Left-sided weakness.   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 According to patient, she has had  A generalized headache , associated with aching in both eyes, for the past 2 days. On 11/24/11, she went to bed at about 10:30-11:00 PM, at which time, she had started noticing some weakness in left arm and leg. She slept, woke at 1:00 AM 11/25/11, and got out of bed to go to the bathroom. The left leg was "dragging" and she fell. Managed to get up, went back into bed, called out for her sister, who called EMS.  In the ED, she was seen initially by Dr Noel Christmas, neurologist as "Code Stroke", but as she was beyond time window for thrombolytic therapy with TPA and her deficits had markedly improved, with NIH stroke score of 0, Code Stroke was canceled.      Allergies:  No Known Allergies    Past Medical History  Diagnosis Date  . Cancer     Past Surgical History  Procedure Date  . Mastectomy   . Tubal ligation     Prior to Admission medications   Medication Sig Start Date End Date Taking? Authorizing Provider  cyanocobalamin (,VITAMIN B-12,) 1000 MCG/ML injection Inject 1,000 mcg into the muscle every 30 (thirty) days.   Yes Historical Provider, MD  Multiple Vitamin (MULITIVITAMIN WITH MINERALS) TABS Take 1 tablet by mouth daily.   Yes Historical Provider, MD  naproxen sodium (ANAPROX) 220 MG tablet Take 220 mg by mouth 2 (two) times daily with a meal.    Yes Historical Provider, MD    Social History: Patient reports that she has been smoking.  She does not have any smokeless tobacco history on file. She reports that she drinks alcohol. She reports that she does not use illicit  drugs. She works at [employer] at [employer], is single, has a Public house manager and offspring.     Family History  Problem Relation Age of Onset  . Heart disease Mother     Review of Systems:  As per HPI and chief complaint. Patent denies fatigue, diminished appetite, weight loss, fever, chills, has a headache and aching in the eyes, but no blurred vision, difficulty in speaking, dysphagia, chest pain, cough, shortness of breath, orthopnea, paroxysmal nocturnal dyspnea, nausea, diaphoresis, abdominal pain, vomiting, diarrhea, belching, heartburn, hematemesis, melena, dysuria, nocturia, urinary frequency, hematochezia, lower extremity swelling, pain, or redness. The rest of the systems review is negative.  Physical Exam:  General:  Patient does not appear to be in obvious acute distress. Alert, communicative, fully oriented, talking in complete sentences, not short of breath at rest.  HEENT:  No clinical pallor, no jaundice, no conjunctival injection or discharge. Hydration status is fair. NECK:  Supple, JVP not seen, no carotid bruits, no palpable lymphadenopathy, no palpable goiter. CHEST:  Clinically clear to auscultation, no wheezes, no crackles. Left breast is absent, and there is no evidence of local disease.  HEART:  Sounds 1 and 2 heard, normal, regular, no murmurs. ABDOMEN:  Full, soft, non-tender, no palpable organomegaly, no palpable masses, normal bowel sounds. GENITALIA:  Not examined. LOWER EXTREMITIES:  No pitting edema, palpable peripheral pulses. MUSCULOSKELETAL SYSTEM:  Unremarkable. CENTRAL NERVOUS SYSTEM:  No facial asymmetry or  cerebellar signs. Power is 5/5 right UE/LE. There is weakness of hand grip in LUE, while power is 4-/5, LLE.   Labs on Admission:  Results for orders placed during the hospital encounter of 11/25/11 (from the past 48 hour(s))  PROTIME-INR     Status: Normal   Collection Time   11/25/11  4:25 AM      Component Value Range Comment   Prothrombin Time 13.7  11.6  - 15.2 seconds    INR 1.06  0.00 - 1.49   APTT     Status: Normal   Collection Time   11/25/11  4:25 AM      Component Value Range Comment   aPTT 34  24 - 37 seconds   CBC     Status: Abnormal   Collection Time   11/25/11  4:25 AM      Component Value Range Comment   WBC 8.9  4.0 - 10.5 K/uL    RBC 3.98  3.87 - 5.11 MIL/uL    Hemoglobin 11.6 (*) 12.0 - 15.0 g/dL    HCT 62.9 (*) 52.8 - 46.0 %    MCV 84.9  78.0 - 100.0 fL    MCH 29.1  26.0 - 34.0 pg    MCHC 34.3  30.0 - 36.0 g/dL    RDW 41.3  24.4 - 01.0 %    Platelets 365  150 - 400 K/uL   DIFFERENTIAL     Status: Normal   Collection Time   11/25/11  4:25 AM      Component Value Range Comment   Neutrophils Relative 55  43 - 77 %    Neutro Abs 4.9  1.7 - 7.7 K/uL    Lymphocytes Relative 32  12 - 46 %    Lymphs Abs 2.8  0.7 - 4.0 K/uL    Monocytes Relative 11  3 - 12 %    Monocytes Absolute 1.0  0.1 - 1.0 K/uL    Eosinophils Relative 1  0 - 5 %    Eosinophils Absolute 0.1  0.0 - 0.7 K/uL    Basophils Relative 1  0 - 1 %    Basophils Absolute 0.1  0.0 - 0.1 K/uL   COMPREHENSIVE METABOLIC PANEL     Status: Abnormal   Collection Time   11/25/11  4:25 AM      Component Value Range Comment   Sodium 137  135 - 145 mEq/L    Potassium 3.9  3.5 - 5.1 mEq/L    Chloride 101  96 - 112 mEq/L    CO2 23  19 - 32 mEq/L    Glucose, Bld 85  70 - 99 mg/dL    BUN 11  6 - 23 mg/dL    Creatinine, Ser 2.72  0.50 - 1.10 mg/dL    Calcium 9.4  8.4 - 53.6 mg/dL    Total Protein 6.9  6.0 - 8.3 g/dL    Albumin 3.1 (*) 3.5 - 5.2 g/dL    AST 53 (*) 0 - 37 U/L    ALT 43 (*) 0 - 35 U/L    Alkaline Phosphatase 99  39 - 117 U/L    Total Bilirubin 0.3  0.3 - 1.2 mg/dL    GFR calc non Af Amer >90  >90 mL/min    GFR calc Af Amer >90  >90 mL/min   TROPONIN I     Status: Normal   Collection Time   11/25/11  4:25 AM  Component Value Range Comment   Troponin I <0.30  <0.30 ng/mL   POCT I-STAT TROPONIN I     Status: Normal   Collection Time   11/25/11   4:45 AM      Component Value Range Comment   Troponin i, poc 0.01  0.00 - 0.08 ng/mL    Comment 3            URINE RAPID DRUG SCREEN (HOSP PERFORMED)     Status: Abnormal   Collection Time   11/25/11  7:19 AM      Component Value Range Comment   Opiates NONE DETECTED  NONE DETECTED    Cocaine POSITIVE (*) NONE DETECTED    Benzodiazepines NONE DETECTED  NONE DETECTED    Amphetamines NONE DETECTED  NONE DETECTED    Tetrahydrocannabinol POSITIVE (*) NONE DETECTED    Barbiturates NONE DETECTED  NONE DETECTED     Radiological Exams on Admission: *RADIOLOGY REPORT*  Clinical Data: Headache for 2 days. Left arm weakness and pain.  CT HEAD WITHOUT CONTRAST  Technique: Contiguous axial images were obtained from the base of the skull through the vertex without contrast.  Comparison: CT of the head performed 11/08/2007  Findings: There is no evidence of acute infarction, mass lesion, or intra- or extra-axial hemorrhage on CT.  Cavum vergae is noted. Mild periventricular white matter change likely reflects small vessel ischemic microangiopathy.  The posterior fossa, including the cerebellum, brainstem and fourth ventricle, is within normal limits. The third and lateral ventricles, and basal ganglia are unremarkable in appearance. The cerebral hemispheres are symmetric in appearance, with normal gray- white differentiation. No mass effect or midline shift is seen.  There is no evidence of fracture; visualized osseous structures are unremarkable in appearance. The orbits are within normal limits. There is partial opacification of the right side of the sphenoid sinus. The remaining paranasal sinuses and mastoid air cells are well-aerated. No significant soft tissue abnormalities are seen.  IMPRESSION:  1. No acute intracranial pathology seen on CT. 2. Mild small vessel ischemic microangiopathy. 3. Partial opacification of the right side of the sphenoid sinus.  These results were  called by telephone on 11/25/2011 at 04:35 a.m. to Dr. Sunnie Nielsen, who verbally acknowledged these results.   Original Report Authenticated By: Tonia Ghent, M.D.   *RADIOLOGY REPORT*  Clinical Data: Headache for 2 days; left-sided weakness. History of breast cancer.  CT ANGIOGRAPHY HEAD  Technique: Multidetector CT imaging of the head was performed using the standard protocol during bolus administration of intravenous contrast. Multiplanar CT image reconstructions including MIPs were obtained to evaluate the vascular anatomy.  Contrast: OMNIPAQUE IOHEXOL 350 MG/ML SOLN  Comparison: CT of the head performed earlier today at 04:25 a.m.  Findings: There is no evidence of focal occlusion within the visualized intracranial vasculature. The anterior and middle cerebral arteries appear intact bilaterally. The posterior cerebral arteries are within normal limits. The visualized internal carotid arteries appear intact bilaterally. The vertebrobasilar system appears intact. No significant stenotic atherosclerotic disease is seen.  Mild small vessel ischemic microangiopathy is noted, as seen on the recent prior CT. Cavum vergae is seen. No abnormal focal enhancement is identified.  The posterior fossa, including the cerebellum, brainstem and fourth ventricle, is within normal limits. The third and lateral ventricles, and basal ganglia are unremarkable in appearance. The cerebral hemispheres are symmetric in appearance, with normal gray- white differentiation. No mass effect or midline shift is seen.  There is no evidence of fracture; visualized  osseous structures are unremarkable in appearance. The orbits are within normal limits. There is partial opacification of the right side of the sphenoid sinus; the remaining paranasal sinuses and mastoid air cells are well-aerated. No significant soft tissue abnormalities are seen.  Review of the MIP images confirms the above  findings.  IMPRESSION:  1. No evidence of focal occlusion within the visualized intracranial vasculature. The carotid and vertebrobasilar systems appear intact. 2. Mild small vessel ischemic microangiopathy. 3. Partial opacification of the right side of the sphenoid sinus.   Original Report Authenticated By: Tonia Ghent, M.D.      Assessment/Plan Active Problems:   1. TIA (transient ischemic attack): Patient presented with left sided upper and lower extremity weakness, culminating in a fall. Last seen normal, at about 10:00 PM on 11/24/11. Symptoms have markedly improved at the time of this evaluation, although there is residual weakness of left hand grip. Imaging studies, including head CT scan and CTA, have revealed no evidence of CVA. Dr Noel Christmas provided neurology consultation, and has recommended low dose ASA, as well as CVA/TIA work up.  2. HTN (hypertension): BP is elevated at 153/108-167/109. Patient has no previous history of HTN. Will place on low dose Norvasc for now, but will not be overly aggressive, till CVA excluded by brain MRI.  3. Sinusitis: Patient has had a headache , as well as aching in the eyes, for 2 days. Head CT scan demonstrated partial opacification of the right side of the sphenoid sinus. We shall address this with a course of Azithromycin.  4. 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 Currently under care of Dr Darnelle Catalan. She appears to have no disease recurrence.  5. Vitamin B12 deficiency: On replacement therapy.   Further management will depend on clinical course.   Comment: Patient is FULL CODE.   Time Spent on Admission: 45 mins.    Mclain Freer,CHRISTOPHER 11/25/2011, 7:56 AM

## 2011-11-25 NOTE — Code Documentation (Signed)
41 yo bf brought to [facility] via [facility] from [facility] s/p Code Stroke call.  Per pt she has had a HA x2days and had Lt side numbness prior to going to bed at 2200 but unaware of what time the numbness started. Pt was seen at [facility], rcvd her CT scan & lab work, & then was transported to [facility].  On arrival to [facility] symptoms other than HA had resolved. Code stroke called @[facility]  0410, pt arrival @ [facility] 0513, EDP exam [phone], stroke team arrival 0510, LSN 2200, pt arrival in CT @ [facility] [phone], phlebotomist arrival 0425, code stroke cancelled 0524. NIH 0

## 2011-11-25 NOTE — ED Provider Notes (Signed)
History     CSN: [id number]  Arrival date & time 11/25/11  4098   First MD Initiated Contact with Patient 11/25/11 [phone]      Chief Complaint  Patient presents with  . Numbness    (Consider location/radiation/quality/duration/timing/severity/associated sxs/prior treatment) The history is provided by the patient.   Hx per PT, went to bed with HA last night around 11pm, no other symptoms, she woke around 2am and tried to walk to the bathroom and fell due to left sided weakness mostly in her leg, she presents here with persistent numbness and weakness and mild HA. No blood thinners, has h/o breast cancer. She denies any other medical problems, no trouble with speech or vision, no h/o same. MOD in severity, no known agrevating or alleviating factors, code stroke called at time of my evaluation 0415 Past Medical History  Diagnosis Date  . Cancer     Past Surgical History  Procedure Date  . Mastectomy   . Tubal ligation     Family History  Problem Relation Age of Onset  . Heart disease Mother     History  Substance Use Topics  . Smoking status: Current Every Day Smoker  . Smokeless tobacco: Not on file  . Alcohol Use: Yes     Comment: beer    OB History    Grav Para Term Preterm Abortions TAB SAB Ect Mult Living   5 4 4  1  1   4       Review of Systems  Constitutional: Negative for fever and chills.  HENT: Negative for neck pain and neck stiffness.   Eyes: Negative for pain.  Respiratory: Negative for shortness of breath.   Cardiovascular: Negative for chest pain.  Gastrointestinal: Negative for abdominal pain.  Genitourinary: Negative for dysuria.  Musculoskeletal: Negative for back pain.  Skin: Negative for rash.  Neurological: Positive for weakness, numbness and headaches. Negative for facial asymmetry.  All other systems reviewed and are negative.    Allergies  Review of patient's allergies indicates no known allergies.  Home Medications   Current  Outpatient Rx  Name  Route  Sig  Dispense  Refill  . VITAMIN B-12 PO   Oral   Take 1 tablet by mouth as needed.         . ADULT MULTIVITAMIN W/MINERALS CH   Oral   Take 1 tablet by mouth daily.         Marland Kitchen NAPROXEN SODIUM 220 MG PO TABS   Oral   Take 220 mg by mouth as needed.           Temp 98.7 F (37.1 C) (Oral)  Ht 5\' 6"  (1.676 m)  Wt 135 lb (61.236 kg)  BMI 21.79 kg/m2  Physical Exam  Nursing note and vitals reviewed. Constitutional: She is oriented to person, place, and time. She appears well-developed and well-nourished.  HENT:  Head: Normocephalic and atraumatic.  Eyes: EOM are normal. Pupils are equal, round, and reactive to light.  Neck: Neck supple.  Cardiovascular: Normal rate, normal heart sounds and intact distal pulses.   Pulmonary/Chest: Effort normal and breath sounds normal. No respiratory distress.  Musculoskeletal: Normal range of motion. She exhibits no edema.  Neurological: She is alert and oriented to person, place, and time.       No facial droop. No eye deviation. Unable to lift LLE off o fbed 2/2 weakness, dec sensation to light touch LUE and LLE. LUE grips dec versus R side. Neg Rhomberg.  Skin: Skin is warm and dry.    ED Course  Procedures (including critical care time)  Results for orders placed during the hospital encounter of 11/25/11  PROTIME-INR      Component Value Range   Prothrombin Time 13.7  11.6 - 15.2 seconds   INR 1.06  0.00 - 1.49  APTT      Component Value Range   aPTT 34  24 - 37 seconds  CBC      Component Value Range   WBC 8.9  4.0 - 10.5 K/uL   RBC 3.98  3.87 - 5.11 MIL/uL   Hemoglobin 11.6 (*) 12.0 - 15.0 g/dL   HCT 45.4 (*) 09.8 - 11.9 %   MCV 84.9  78.0 - 100.0 fL   MCH 29.1  26.0 - 34.0 pg   MCHC 34.3  30.0 - 36.0 g/dL   RDW 14.7  82.9 - 56.2 %   Platelets 365  150 - 400 K/uL  DIFFERENTIAL      Component Value Range   Neutrophils Relative 55  43 - 77 %   Neutro Abs 4.9  1.7 - 7.7 K/uL   Lymphocytes  Relative 32  12 - 46 %   Lymphs Abs 2.8  0.7 - 4.0 K/uL   Monocytes Relative 11  3 - 12 %   Monocytes Absolute 1.0  0.1 - 1.0 K/uL   Eosinophils Relative 1  0 - 5 %   Eosinophils Absolute 0.1  0.0 - 0.7 K/uL   Basophils Relative 1  0 - 1 %   Basophils Absolute 0.1  0.0 - 0.1 K/uL  POCT I-STAT TROPONIN I      Component Value Range   Troponin i, poc 0.01  0.00 - 0.08 ng/mL   Comment 3            Ct Head Wo Contrast  11/25/2011  *RADIOLOGY REPORT*  Clinical Data: Headache for 2 days.  Left arm weakness and pain.  CT HEAD WITHOUT CONTRAST  Technique:  Contiguous axial images were obtained from the base of the skull through the vertex without contrast.  Comparison: CT of the head performed 11/08/2007  Findings: There is no evidence of acute infarction, mass lesion, or intra- or extra-axial hemorrhage on CT.  Cavum vergae is noted.  Mild periventricular white matter change likely reflects small vessel ischemic microangiopathy.  The posterior fossa, including the cerebellum, brainstem and fourth ventricle, is within normal limits.  The third and lateral ventricles, and basal ganglia are unremarkable in appearance.  The cerebral hemispheres are symmetric in appearance, with normal gray- white differentiation.  No mass effect or midline shift is seen.  There is no evidence of fracture; visualized osseous structures are unremarkable in appearance.  The orbits are within normal limits. There is partial opacification of the right side of the sphenoid sinus.  The remaining paranasal sinuses and mastoid air cells are well-aerated.  No significant soft tissue abnormalities are seen.  IMPRESSION:  1.  No acute intracranial pathology seen on CT. 2.  Mild small vessel ischemic microangiopathy. 3.  Partial opacification of the right side of the sphenoid sinus.  These results were called by telephone on 11/25/2011 at 04:35 a.m. to Dr. Sunnie Nielsen, who verbally acknowledged these results.   Original Report Authenticated  By: Tonia Ghent, M.D.      Date: 11/25/2011  Rate: 100  Rhythm: normal sinus rhythm  QRS Axis: normal  Intervals: normal  ST/T Wave abnormalities: nonspecific ST changes  Conduction  Disutrbances:none  Narrative Interpretation:   Old EKG Reviewed: none available  CT head reviewed. 4:45 AM d/w Dr Roseanne Reno on call for NEU, recs CT Angio head while on the table and transfer to [facility] for possible intervention/ outside of window for tPA at time of arrival to this ED.   4:57 AM DR Roseanne Reno requesting PT be sent to the ED at [facility]   MDM   Stroke symptoms LSN around 11pm. Stat CT brain - code stroke. Transferred to stroke center, accepted by Dr Roseanne Reno        Sunnie Nielsen, MD 11/26/11 2314

## 2011-11-25 NOTE — ED Notes (Signed)
Code stroke cancelled by MD.

## 2011-11-25 NOTE — ED Notes (Signed)
Md ordered meds for pt's h/a. [facility] stated that neurologist would not approve of ordered meds. MD opitz informed and stated ok to hold medication. Pt out of ED with [facility] to transport to [facility] [place]

## 2011-11-25 NOTE — ED Notes (Signed)
[facility] in ED to transport pt to [place]. MD opitz informed [facility] to wait until md speaks with neurologist before transporting pt.

## 2011-11-25 NOTE — ED Notes (Signed)
MD at bedside. And RRT at bedside now

## 2011-11-25 NOTE — ED Provider Notes (Signed)
Pt seen after transfer from [facility].  Dr Roseanne Reno at bedside for code stroke transfer.  After his exam, he feels that NIH scale now at 0, weakness has resolved.  He wishes pt to be admitted for TIA to hospitalist service.  Neurology to follow along.  Olivia Mackie, MD 11/25/11 0530

## 2011-11-25 NOTE — ED Notes (Addendum)
Per [facility], pt is a transfer from [facility] wake up at around 3 am with some numbness and tingling to left side, Hx of HA for 2 days, head ct from [facility] neg for bleed,  18ga RFA. Stroke team at bedside .

## 2011-11-25 NOTE — ED Notes (Signed)
Md aware of pt BP

## 2011-11-25 NOTE — Progress Notes (Signed)
VASCULAR LAB PRELIMINARY  PRELIMINARY  PRELIMINARY  PRELIMINARY  Carotid duplex completed.    Preliminary report:  Bilateral:  No evidence of hemodynamically significant internal carotid artery stenosis.   Vertebral artery flow is antegrade.      Aleaha Fickling, RVT 11/25/2011, 11:10 AM

## 2011-11-25 NOTE — Consult Note (Signed)
Referring Physician: Dr. Dierdre Highman    Chief Complaint: New-onset of left-sided weakness  HPI: Sabrina Holland is an 41 y.o. female with a history of breast cancer, presenting with new onset weakness involving left arm and left leg. She was last known normal at 2200 on 11/24/2011. Has no previous history of stroke nor TIA. She has not been on antiplatelet therapy. CT scan of her head showed no acute intracranial abnormality. CTA showed no signs of intracranial vascular occlusion. Patient was beyond time window for intravenous thrombolytic therapy with TPA by the time she presented to the emergency room. Her deficits subsequently resolved. NIH stroke score at this point is 0. Code stroke was canceled.  LSN: 20/200 on 11/24/2011 tPA Given: No: Beyond time window for intravenous TPA; deficits resolved. MRankin: 0  Past Medical History  Diagnosis Date  . Cancer     Family History  Problem Relation Age of Onset  . Heart disease Mother      Medications:  Prior to Admission:  Tamoxifen  Physical Examination: Blood pressure 176/123, pulse 95, temperature 98.7 F (37.1 C), temperature source Oral, resp. rate 17, height 5\' 6"  (1.676 m), weight 61.236 kg (135 lb), SpO2 100.00%.  Neurologic Examination: Mental Status: Alert, oriented, thought content appropriate.  Speech fluent without evidence of aphasia. Able to follow commands without difficulty. Cranial Nerves: II-Visual fields were normal. III/IV/VI-Pupils were equal and reacted. Extraocular movements were full and conjugate.    V/VII-no facial numbness and no facial weakness. VIII-normal. X-normal speech and symmetrical palatal movement. XII-midline tongue extension Motor: 5/5 bilaterally with normal tone and bulk Sensory: Normal throughout. Deep Tendon Reflexes: 2+ and symmetric. Plantars: Flexor bilaterally Cerebellar: Normal finger-to-nose and heel-to-shin testing bilaterally.  Ct Angio Head W/cm &/or Wo Cm  11/25/2011   *RADIOLOGY REPORT*  Clinical Data:  Headache for 2 days; left-sided weakness.  History of breast cancer.  CT ANGIOGRAPHY HEAD  Technique:  Multidetector CT imaging of the head was performed using the standard protocol during bolus administration of intravenous contrast.  Multiplanar CT image reconstructions including MIPs were obtained to evaluate the vascular anatomy.  Contrast: OMNIPAQUE IOHEXOL 350 MG/ML SOLN  Comparison:  CT of the head performed earlier today at 04:25 a.m.  Findings:  There is no evidence of focal occlusion within the visualized intracranial vasculature.  The anterior and middle cerebral arteries appear intact bilaterally.  The posterior cerebral arteries are within normal limits. The visualized internal carotid arteries appear intact bilaterally.  The vertebrobasilar system appears intact.  No significant stenotic atherosclerotic disease is seen.  Mild small vessel ischemic microangiopathy is noted, as seen on the recent prior CT.  Cavum vergae is seen.  No abnormal focal enhancement is identified.  The posterior fossa, including the cerebellum, brainstem and fourth ventricle, is within normal limits.  The third and lateral ventricles, and basal ganglia are unremarkable in appearance.  The cerebral hemispheres are symmetric in appearance, with normal gray- white differentiation.  No mass effect or midline shift is seen.  There is no evidence of fracture; visualized osseous structures are unremarkable in appearance.  The orbits are within normal limits. There is partial opacification of the right side of the sphenoid sinus; the remaining paranasal sinuses and mastoid air cells are well-aerated.  No significant soft tissue abnormalities are seen.   Review of the MIP images confirms the above findings.  IMPRESSION:  1.  No evidence of focal occlusion within the visualized intracranial vasculature.  The carotid and vertebrobasilar systems appear intact.  2.  Mild small vessel ischemic  microangiopathy. 3.  Partial opacification of the right side of the sphenoid sinus.   Original Report Authenticated By: Tonia Ghent, M.D.    Ct Head Wo Contrast  11/25/2011  *RADIOLOGY REPORT*  Clinical Data: Headache for 2 days.  Left arm weakness and pain.  CT HEAD WITHOUT CONTRAST  Technique:  Contiguous axial images were obtained from the base of the skull through the vertex without contrast.  Comparison: CT of the head performed 11/08/2007  Findings: There is no evidence of acute infarction, mass lesion, or intra- or extra-axial hemorrhage on CT.  Cavum vergae is noted.  Mild periventricular white matter change likely reflects small vessel ischemic microangiopathy.  The posterior fossa, including the cerebellum, brainstem and fourth ventricle, is within normal limits.  The third and lateral ventricles, and basal ganglia are unremarkable in appearance.  The cerebral hemispheres are symmetric in appearance, with normal gray- white differentiation.  No mass effect or midline shift is seen.  There is no evidence of fracture; visualized osseous structures are unremarkable in appearance.  The orbits are within normal limits. There is partial opacification of the right side of the sphenoid sinus.  The remaining paranasal sinuses and mastoid air cells are well-aerated.  No significant soft tissue abnormalities are seen.  IMPRESSION:  1.  No acute intracranial pathology seen on CT. 2.  Mild small vessel ischemic microangiopathy. 3.  Partial opacification of the right side of the sphenoid sinus.  These results were called by telephone on 11/25/2011 at 04:35 a.m. to Dr. Sunnie Nielsen, who verbally acknowledged these results.   Original Report Authenticated By: Tonia Ghent, M.D.     Assessment: 41 y.o. female presenting with possible right subcortical TIA. Small vessel subcortical stroke cannot be ruled out at this point as well.  Stroke Risk Factors - hypertension  Plan: 1. HgbA1c, fasting lipid panel 2. MRI  of the brain without contrast 3. Echocardiogram 4. Carotid dopplers 5. Prophylactic therapy-Antiplatelet med: Aspirin and and 81 mg per day 6. Risk factor modification 7. Telemetry monitoring   C.R. Roseanne Reno, MD [facility] [phone]  11/25/2011, 5:34 AM

## 2011-11-26 DIAGNOSIS — F1910 Other psychoactive substance abuse, uncomplicated: Secondary | ICD-10-CM | POA: Diagnosis present

## 2011-11-26 LAB — COMPREHENSIVE METABOLIC PANEL
AST: 48 U/L — ABNORMAL HIGH (ref 0–37)
CO2: 30 mEq/L (ref 19–32)
Calcium: 9.4 mg/dL (ref 8.4–10.5)
Creatinine, Ser: 0.7 mg/dL (ref 0.50–1.10)
GFR calc non Af Amer: >90 mL/min (ref >90)
Total Protein: 6.6 g/dL (ref 6.0–8.3)

## 2011-11-26 LAB — CBC
MCH: 28.3 pg (ref 26.0–34.0)
MCHC: 32.9 g/dL (ref 30.0–36.0)
MCV: 86.2 fL (ref 78.0–100.0)
Platelets: 369 10*3/uL (ref 150–400)
RBC: 4.06 MIL/uL (ref 3.87–5.11)
RDW: 14.5 % (ref 11.5–15.5)

## 2011-11-26 LAB — LIPID PANEL
HDL: 146 mg/dL (ref >39)
LDL Cholesterol: 39 mg/dL (ref 0–99)
Triglycerides: 75 mg/dL (ref <150)

## 2011-11-26 MED ORDER — METOPROLOL TARTRATE 25 MG PO TABS: 25.0000 mg | ORAL_TABLET | Freq: Two times a day (BID) | ORAL | Status: DC

## 2011-11-26 MED ORDER — TRAMADOL HCL 50 MG PO TABS: 50.0000 mg | ORAL_TABLET | Freq: Three times a day (TID) | ORAL | Status: DC

## 2011-11-26 MED ORDER — AZITHROMYCIN 250 MG PO TABS: ORAL_TABLET | ORAL | Status: DC

## 2011-11-26 MED ORDER — AMLODIPINE BESYLATE 5 MG PO TABS: 5.0000 mg | ORAL_TABLET | Freq: Every day | ORAL | Status: DC

## 2011-11-26 MED ORDER — ASPIRIN 81 MG PO TBEC: 81.0000 mg | DELAYED_RELEASE_TABLET | Freq: Every day | ORAL | Status: DC

## 2011-11-26 MED ORDER — NICOTINE 7 MG/24HR TD PT24: 1.0000 | MEDICATED_PATCH | Freq: Every day | TRANSDERMAL | Status: DC

## 2011-11-26 NOTE — Progress Notes (Signed)
Stroke Team Progress Note  HISTORY Sabrina Holland is an 41 y.o. female with a history of breast cancer, presenting with new onset weakness involving left arm and left leg. She was last known normal at 2200 on 11/24/2011. Has no previous history of stroke nor TIA. She has not been on antiplatelet therapy. CT scan of her head showed no acute intracranial abnormality. CTA showed no signs of intracranial vascular occlusion. Patient was beyond time window for intravenous thrombolytic therapy with TPA by the time she presented to the emergency room. Her deficits subsequently resolved. NIH stroke score at this point is 0. Code stroke was canceled. She was admitted for further evaluation and treatment.  SUBJECTIVE Her daughters are not  at the bedside today..  Overall she feels her condition is improved. No residual complaints. Wants to go home. Most recent Vital Signs: Filed Vitals:   11/25/11 2000 11/26/11 0000 11/26/11 0400 11/26/11 0800  BP: 125/76 133/85 128/87 148/85  Pulse: 74 62 65 70  Temp: 97.3 F (36.3 C)  97.5 F (36.4 C) 97.9 F (36.6 C)  TempSrc: Oral  Oral Oral  Resp:    18  Height:      Weight:      SpO2: 100%  100% 100%   CBG (last 3)  No results found for this basename: GLUCAP:3 in the last 72 hours  IV Fluid Intake:     MEDICATIONS     . [COMPLETED]  stroke: mapping our early stages of recovery book   Does not apply Once  . amLODipine  5 mg Oral Daily  . aspirin  325 mg Oral Daily  . azithromycin  250 mg Oral Daily  . cyanocobalamin  1,000 mcg Intramuscular Q30 days  . heparin  5,000 Units Subcutaneous Q8H  . [COMPLETED] heparin  5,000 Units Intravenous Once  . multivitamin with minerals  1 tablet Oral Daily  . nicotine  7 mg Transdermal Daily  . ondansetron  4 mg Intravenous Once  . traMADol  50 mg Oral TID   PRN:  morphine injection  Diet:  Cardiac  Activity:   Bathroom privileges with assistance DVT Prophylaxis:  Heparin 5000 units sq tid  CLINICALLY  SIGNIFICANT STUDIES Basic Metabolic Panel:   Lab 11/26/11 0610 11/25/11 0425  NA 140 137  K 3.6 3.9  CL 102 101  CO2 30 23  GLUCOSE 97 85  BUN 9 11  CREATININE 0.70 0.70  CALCIUM 9.4 9.4  MG -- --  PHOS -- --   Liver Function Tests:   Lab 11/26/11 0610 11/25/11 0425  AST 48* 53*  ALT 38* 43*  ALKPHOS 93 99  BILITOT 0.2* 0.3  PROT 6.6 6.9  ALBUMIN 2.8* 3.1*   CBC:   Lab 11/26/11 0610 11/25/11 0425  WBC 7.0 8.9  NEUTROABS -- 4.9  HGB 11.5* 11.6*  HCT 35.0* 33.8*  MCV 86.2 84.9  PLT 369 365   Coagulation:   Lab 11/25/11 0425  LABPROT 13.7  INR 1.06   Cardiac Enzymes:   Lab 11/25/11 0425  CKTOTAL --  CKMB --  CKMBINDEX --  TROPONINI <0.30   Urinalysis: No results found for this basename: COLORURINE:2,APPERANCEUR:2,LABSPEC:2,PHURINE:2,GLUCOSEU:2,HGBUR:2,BILIRUBINUR:2,KETONESUR:2,PROTEINUR:2,UROBILINOGEN:2,NITRITE:2,LEUKOCYTESUR:2 in the last 168 hours Lipid Panel    Component Value Date/Time   CHOL 200 11/26/2011 0610   TRIG 75 11/26/2011 0610   HDL 146 11/26/2011 0610   CHOLHDL 1.4 11/26/2011 0610   VLDL 15 11/26/2011 0610   LDLCALC 39 11/26/2011 0610   HgbA1C  Lab Results  Component Value Date   HGBA1C 5.2 11/25/2011    Urine Drug Screen:     Component Value Date/Time   LABOPIA NONE DETECTED 11/25/2011 0719   COCAINSCRNUR POSITIVE* 11/25/2011 0719   LABBENZ NONE DETECTED 11/25/2011 0719   AMPHETMU NONE DETECTED 11/25/2011 0719   THCU POSITIVE* 11/25/2011 0719   LABBARB NONE DETECTED 11/25/2011 0719    Alcohol Level: No results found for this basename: ETH:2 in the last 168 hours  CT of the brain  11/25/2011 1.  No acute intracranial pathology seen on CT. 2.  Mild small vessel ischemic microangiopathy. 3.  Partial opacification of the right side of the sphenoid sinus.    CT Angio Head 11/25/2011  1.  No evidence of focal occlusion within the visualized intracranial vasculature.  The carotid and vertebrobasilar systems appear intact. 2.  Mild small vessel  ischemic microangiopathy. 3.  Partial opacification of the right side of the sphenoid sinus.  MRI of the brain  No acute infarct. Incidental cyst of cavum vergae  MRA of the brain  Not done. See Ct angio  2D Echocardiogram  Normal EF. No clot  Carotid Doppler  No stenosis  CXR    EKG SINUS TACHYCARDIA ~ V-rate> 99 PROBABLE LEFT ATRIAL ABNORMALITY ~ P >17mS, <-0.47mV V1 BORDERLINE T ABNORMALITIES, ANT-LAT LEADS ~ T flat/neg, I aVL V2-V6 Therapy Recommendations PT -pending OT -pending; ST - pending  Physical Exam  Frail middle aged african american lady not in distress.Awake alert. Afebrile. Head is nontraumatic. Neck is supple without bruit. Hearing is normal. Cardiac exam no murmur or gallop. Lungs are clear to auscultation. Distal pulses are well felt.  Neurological exam ; Awake  Alert oriented x 3. Normal speech and language.eye movements full without nystagmus. Fundi not visualized. Vision acuity and fields are adequate.Face symmetric. Tongue midline. Normal strength, tone, reflexes and coordination. Normal sensation. Gait deferred.   ASSESSMENT [name] is a 41 y.o. female presenting with transient weakness involving left arm and left leg- suspect right brain TIA. CT-scan of the  head negative. MRI pending.Work up underway. On no antiplatlets prior to admission. Now on aspirin 325 mg orally every day for secondary stroke prevention. Patient with resultant no residual deficits.     Hospital day # 1  TREATMENT/PLAN  Continue aspirin for secondary stroke prevention.  Continue stroke work up  D/w patient    Stroke Team will sign off.  Annie Main, MSN, RN, ANVP-BC, ANP-BC, [facility] Pager: [phone] 11/26/2011 9:57 AM  Scribe for Dr. Delia Heady, Stroke Center Medical Director, who has personally reviewed chart, pertinent data, examined the patient and developed the plan of care. Pager:  [phone]

## 2011-11-26 NOTE — Discharge Summary (Signed)
Physician Discharge Summary  Patient ID: Sabrina Holland MRN: [id number] DOB/AGE: Dec 07, 1970 41 y.o.  Admit date: 11/25/2011 Discharge date: 11/26/2011  Primary Care Physician:  Pcp Not In System  Discharge Diagnoses:    . TIA (transient ischemic attack) . HTN (hypertension) . Sinusitis Polysubstance abuse (marijuana, cocaine)  Consults:  Neurology, stroke service   Discharge Medications:   Medication List     As of 11/26/2011 10:26 AM    STOP taking these medications         naproxen sodium 220 MG tablet   Commonly known as: ANAPROX      TAKE these medications         amLODipine 5 MG tablet   Commonly known as: NORVASC   Take 1 tablet (5 mg total) by mouth daily.      aspirin 81 MG EC tablet   Take 1 tablet (81 mg total) by mouth daily. Swallow whole.      azithromycin 250 MG tablet   Commonly known as: ZITHROMAX   Take as directed on the pack      cyanocobalamin 1000 MCG/ML injection   Commonly known as: (VITAMIN B-12)   Inject 1,000 mcg into the muscle every 30 (thirty) days.      multivitamin with minerals Tabs   Take 1 tablet by mouth daily.      nicotine 7 mg/24hr patch   Commonly known as: NICODERM CQ - dosed in mg/24 hr   Place 1 patch onto the skin daily.      traMADol 50 MG tablet   Commonly known as: ULTRAM   Take 1 tablet (50 mg total) by mouth 3 (three) times daily.         Brief H and P: For complete details please refer to admission H and P, but in brief patient is a 41 year old female with history of left breast CA, vitamin B12 deficiency presented with generalized headache, aching in both eyes, some weakness in the left arm and leg. Patient was seen in the ED as code stroke and admitted for further workup.  Hospital Course:    TIA (transient ischemic attack): Patient was admitted with his new onset weakness involving left arm and left leg but no previous history of stroke or TIA. Patient was not on any antiplatelet therapy prior to  the admission. Patient was beyond the time window for intravenous thrombolytic therapy with TPA. CT head was negative for acute stroke. MRI of the brain showed no acute infarct. CT angiography was done which showed no evidence of focal occlusion within the visualized intracranial vasculature. 2-D echocardiogram showed normal EF with no thrombus. Carotid Dopplers showed no ICA stenosis. Patient was placed on aspirin 81 mg daily and BP control.   HTN (hypertension): Patient was started on Norvasc for BP control.   Sinusitis: Placed on a Zithromax.  Polysubstance abuse: Urine toxicology screen was positive for cocaine and marijuana. Patient was strongly counseled to quit drugs and social worker consult was placed.   Day of Discharge BP 148/85  Pulse 70  Temp 97.9 F (36.6 C) (Oral)  Resp 18  Ht 5\' 6"  (1.676 m)  Wt 61.236 kg (135 lb)  BMI 21.79 kg/m2  SpO2 100%  Physical Exam: General: Alert and awake oriented x3 not in any acute distress. HEENT: anicteric sclera, pupils reactive to light and accommodation CVS: S1-S2 clear no murmur rubs or gallops Chest: clear to auscultation bilaterally, no wheezing rales or rhonchi Abdomen: soft nontender, nondistended, normal bowel sounds, no  organomegaly Extremities: no cyanosis, clubbing or edema noted bilaterally Neuro: Cranial nerves II-XII intact, no focal neurological deficits   The results of significant diagnostics from this hospitalization (including imaging, microbiology, ancillary and laboratory) are listed below for reference.    LAB RESULTS: Basic Metabolic Panel:  Lab 11/26/11 2956 11/25/11 0425  NA 140 137  K 3.6 3.9  CL 102 101  CO2 30 23  GLUCOSE 97 85  BUN 9 11  CREATININE 0.70 0.70  CALCIUM 9.4 9.4  MG -- --  PHOS -- --   Liver Function Tests:  Lab 11/26/11 0610 11/25/11 0425  AST 48* 53*  ALT 38* 43*  ALKPHOS 93 99  BILITOT 0.2* 0.3  PROT 6.6 6.9  ALBUMIN 2.8* 3.1*   CBC:  Lab 11/26/11 0610 11/25/11 0425    WBC 7.0 8.9  NEUTROABS -- 4.9  HGB 11.5* 11.6*  HCT 35.0* 33.8*  MCV 86.2 --  PLT 369 365   Cardiac Enzymes:  Lab 11/25/11 0425  CKTOTAL --  CKMB --  CKMBINDEX --  TROPONINI <0.30    Significant Diagnostic Studies:  Ct Angio Head W/cm &/or Wo Cm  11/25/2011  *RADIOLOGY REPORT*  Clinical Data:  Headache for 2 days; left-sided weakness.  History of breast cancer.  CT ANGIOGRAPHY HEAD  Technique:  Multidetector CT imaging of the head was performed using the standard protocol during bolus administration of intravenous contrast.  Multiplanar CT image reconstructions including MIPs were obtained to evaluate the vascular anatomy.  Contrast: OMNIPAQUE IOHEXOL 350 MG/ML SOLN  Comparison:  CT of the head performed earlier today at 04:25 a.m.  Findings:  There is no evidence of focal occlusion within the visualized intracranial vasculature.  The anterior and middle cerebral arteries appear intact bilaterally.  The posterior cerebral arteries are within normal limits. The visualized internal carotid arteries appear intact bilaterally.  The vertebrobasilar system appears intact.  No significant stenotic atherosclerotic disease is seen.  Mild small vessel ischemic microangiopathy is noted, as seen on the recent prior CT.  Cavum vergae is seen.  No abnormal focal enhancement is identified.  The posterior fossa, including the cerebellum, brainstem and fourth ventricle, is within normal limits.  The third and lateral ventricles, and basal ganglia are unremarkable in appearance.  The cerebral hemispheres are symmetric in appearance, with normal gray- white differentiation.  No mass effect or midline shift is seen.  There is no evidence of fracture; visualized osseous structures are unremarkable in appearance.  The orbits are within normal limits. There is partial opacification of the right side of the sphenoid sinus; the remaining paranasal sinuses and mastoid air cells are well-aerated.  No significant soft  tissue abnormalities are seen.   Review of the MIP images confirms the above findings.  IMPRESSION:  1.  No evidence of focal occlusion within the visualized intracranial vasculature.  The carotid and vertebrobasilar systems appear intact. 2.  Mild small vessel ischemic microangiopathy. 3.  Partial opacification of the right side of the sphenoid sinus.   Original Report Authenticated By: Tonia Ghent, M.D.    Ct Head Wo Contrast  11/25/2011  *RADIOLOGY REPORT*  Clinical Data: Headache for 2 days.  Left arm weakness and pain.  CT HEAD WITHOUT CONTRAST  Technique:  Contiguous axial images were obtained from the base of the skull through the vertex without contrast.  Comparison: CT of the head performed 11/08/2007  Findings: There is no evidence of acute infarction, mass lesion, or intra- or extra-axial hemorrhage on CT.  Cavum vergae is noted.  Mild periventricular white matter change likely reflects small vessel ischemic microangiopathy.  The posterior fossa, including the cerebellum, brainstem and fourth ventricle, is within normal limits.  The third and lateral ventricles, and basal ganglia are unremarkable in appearance.  The cerebral hemispheres are symmetric in appearance, with normal gray- white differentiation.  No mass effect or midline shift is seen.  There is no evidence of fracture; visualized osseous structures are unremarkable in appearance.  The orbits are within normal limits. There is partial opacification of the right side of the sphenoid sinus.  The remaining paranasal sinuses and mastoid air cells are well-aerated.  No significant soft tissue abnormalities are seen.  IMPRESSION:  1.  No acute intracranial pathology seen on CT. 2.  Mild small vessel ischemic microangiopathy. 3.  Partial opacification of the right side of the sphenoid sinus.  These results were called by telephone on 11/25/2011 at 04:35 a.m. to Dr. Sunnie Nielsen, who verbally acknowledged these results.   Original Report  Authenticated By: Tonia Ghent, M.D.    Mri Brain Without Contrast  11/25/2011  *RADIOLOGY REPORT*  Clinical Data:  Transient ischemic attack with left upper lower extremity weakness.  Slight residual left arm weakness.  MRI HEAD WITHOUT CONTRAST  Technique:  Multiplanar, multiecho pulse sequences of the brain and surrounding structures were obtained according to standard protocol without intravenous contrast.  Comparison:  Normal CT head and CTA head.  Findings: There is no evidence for acute infarction, intracranial hemorrhage, mass lesion, hydrocephalus, or extra-axial fluid. Mild premature cerebral atrophy.  Incidental cavum vergae cyst.  Slight abnormal white matter signal in the subcortical and periventricular regions, likely post treatment effect from chemotherapy although chronic microvascular ischemic change not excluded. No midline shift.  Normal pituitary and cerebellar tonsils.  Upper cervical spine region unremarkable although bilateral incompletely evaluated cervical adenopathy is suspected.  Moderately extensive sphenoid sinus disease with retention cyst in the right greater than left sphenoid compartments.   Mucosal thickening can be seen in both maxillary sinuses, throughout both right and left ethmoids, and in the frontal sinuses.  Negative orbits.  No mastoid fluid.  IMPRESSION: No acute stroke or intracranial mass lesion is identified.  Slight premature atrophy and mild nonspecific white matter disease.  Chronic sinusitis most pronounced in the sphenoid region.   Original Report Authenticated By: Davonna Belling, M.D.      Disposition and Follow-up:     Discharge Orders    Future Appointments: Provider: Department: Dept Phone: Center:   12/05/2011 3:30 PM [facility] [phone] None   01/02/2012 3:30 PM [facility] [phone] None   01/30/2012 3:30 PM [facility] [phone] None   02/20/2012 10:30 AM Radene Gunning [facility] MED[facility] ON[facility] [phone] None   02/27/2012 11:00 AM Lowella Dell, MD [facility] MED[facility] ONCOLOGY [phone] None     Future Orders Please Complete By Expires   Diet - low sodium heart healthy      Increase activity slowly          DISPOSITION: Home DIET: Heart healthy diet ACTIVITY: As tolerated   DISCHARGE FOLLOW-UP Follow-up Information    Schedule an appointment as soon as possible for a visit in 2 weeks to follow up. (for hospital follow-up)    Contact information:   [facility]   [address] [place]  [address]  [place] [address]   Phone [phone]         Time spent on Discharge: 35 minutes  Signed:   Arlester Keehan M.D. [facility] 11/26/2011, 10:26 AM Pager: [phone]

## 2011-12-05 ENCOUNTER — Ambulatory Visit: Payer: Self-pay

## 2011-12-05 ENCOUNTER — Ambulatory Visit (HOSPITAL_BASED_OUTPATIENT_CLINIC_OR_DEPARTMENT_OTHER): Payer: Self-pay

## 2011-12-05 VITALS — BP 131/85 | HR 101 | Temp 97.7°F

## 2011-12-05 DIAGNOSIS — E538 Deficiency of other specified B group vitamins: Secondary | ICD-10-CM

## 2011-12-05 DIAGNOSIS — C50912 Malignant neoplasm of unspecified site of left female breast: Secondary | ICD-10-CM

## 2011-12-05 MED ORDER — CYANOCOBALAMIN 1000 MCG/ML IJ SOLN
1000.0000 ug | Freq: Once | INTRAMUSCULAR | Status: AC
  Administered 2011-12-05: 1000 ug via INTRAMUSCULAR

## 2012-01-02 ENCOUNTER — Ambulatory Visit: Payer: Self-pay

## 2012-01-04 ENCOUNTER — Ambulatory Visit (HOSPITAL_BASED_OUTPATIENT_CLINIC_OR_DEPARTMENT_OTHER): Payer: Self-pay

## 2012-01-04 ENCOUNTER — Ambulatory Visit: Payer: Self-pay

## 2012-01-04 VITALS — BP 119/64 | HR 99 | Temp 97.8°F

## 2012-01-04 DIAGNOSIS — C50912 Malignant neoplasm of unspecified site of left female breast: Secondary | ICD-10-CM

## 2012-01-04 DIAGNOSIS — E538 Deficiency of other specified B group vitamins: Secondary | ICD-10-CM

## 2012-01-04 MED ORDER — CYANOCOBALAMIN 1000 MCG/ML IJ SOLN
1000.0000 ug | Freq: Once | INTRAMUSCULAR | Status: AC
  Administered 2012-01-04: 1000 ug via INTRAMUSCULAR

## 2012-01-04 NOTE — Patient Instructions (Signed)
Call MD for problems 

## 2012-01-30 ENCOUNTER — Ambulatory Visit (HOSPITAL_BASED_OUTPATIENT_CLINIC_OR_DEPARTMENT_OTHER): Payer: Self-pay

## 2012-01-30 ENCOUNTER — Telehealth: Payer: Self-pay | Admitting: Oncology

## 2012-01-30 ENCOUNTER — Ambulatory Visit: Payer: Self-pay

## 2012-01-30 VITALS — BP 126/86 | HR 104 | Temp 97.2°F

## 2012-01-30 DIAGNOSIS — C50912 Malignant neoplasm of unspecified site of left female breast: Secondary | ICD-10-CM

## 2012-01-30 DIAGNOSIS — E538 Deficiency of other specified B group vitamins: Secondary | ICD-10-CM

## 2012-01-30 MED ORDER — CYANOCOBALAMIN 1000 MCG/ML IJ SOLN
1000.0000 ug | Freq: Once | INTRAMUSCULAR | Status: AC
  Administered 2012-01-30: 1000 ug via INTRAMUSCULAR

## 2012-01-30 NOTE — Telephone Encounter (Signed)
Pt stopped by to change her lab appt on 2/3,reprinted    anne

## 2012-02-20 ENCOUNTER — Other Ambulatory Visit (HOSPITAL_BASED_OUTPATIENT_CLINIC_OR_DEPARTMENT_OTHER): Payer: Self-pay

## 2012-02-20 ENCOUNTER — Other Ambulatory Visit: Payer: Self-pay | Admitting: Lab

## 2012-02-20 DIAGNOSIS — C50919 Malignant neoplasm of unspecified site of unspecified female breast: Secondary | ICD-10-CM

## 2012-02-20 DIAGNOSIS — C50912 Malignant neoplasm of unspecified site of left female breast: Secondary | ICD-10-CM

## 2012-02-20 LAB — COMPREHENSIVE METABOLIC PANEL (CC13)
ALT: 38 U/L (ref 0–55)
AST: 43 U/L — ABNORMAL HIGH (ref 5–34)
Alkaline Phosphatase: 86 U/L (ref 40–150)
Creatinine: 1.4 mg/dL — ABNORMAL HIGH (ref 0.6–1.1)
Total Bilirubin: <0.2 mg/dL (ref 0.20–1.20)

## 2012-02-20 LAB — CBC WITH DIFFERENTIAL/PLATELET
BASO%: 1.3 % (ref 0.0–2.0)
EOS%: 2 % (ref 0.0–7.0)
HCT: 32.5 % — ABNORMAL LOW (ref 34.8–46.6)
LYMPH%: 34.4 % (ref 14.0–49.7)
MCH: 29.8 pg (ref 25.1–34.0)
MCHC: 34.2 g/dL (ref 31.5–36.0)
NEUT%: 43.6 % (ref 38.4–76.8)
Platelets: 399 10*3/uL (ref 145–400)
lymph#: 2.2 10*3/uL (ref 0.9–3.3)

## 2012-02-27 ENCOUNTER — Telehealth: Payer: Self-pay | Admitting: Oncology

## 2012-02-27 ENCOUNTER — Ambulatory Visit (HOSPITAL_BASED_OUTPATIENT_CLINIC_OR_DEPARTMENT_OTHER): Payer: Self-pay | Admitting: Oncology

## 2012-02-27 ENCOUNTER — Ambulatory Visit: Payer: Self-pay

## 2012-02-27 VITALS — BP 115/74 | HR 96 | Temp 97.9°F | Resp 20 | Ht 66.0 in | Wt 134.0 lb

## 2012-02-27 DIAGNOSIS — C50912 Malignant neoplasm of unspecified site of left female breast: Secondary | ICD-10-CM

## 2012-02-27 DIAGNOSIS — Z9010 Acquired absence of unspecified breast and nipple: Secondary | ICD-10-CM

## 2012-02-27 DIAGNOSIS — Z853 Personal history of malignant neoplasm of breast: Secondary | ICD-10-CM

## 2012-02-27 DIAGNOSIS — Z170 Estrogen receptor positive status [ER+]: Secondary | ICD-10-CM

## 2012-02-27 NOTE — Telephone Encounter (Signed)
gv pt appt schedule for May and June.  °

## 2012-02-27 NOTE — Progress Notes (Signed)
ID: Sabrina Holland   DOB: July 21, 1970  MR#: [id number]  CSN#:[id number]  PCP: Pcp Not In System GYN: SU: [name] OTHER MD: [name]  HISTORY OF PRESENT ILLNESS: The patient presented to Urgent Care at [facility] with a complaint of toe pain.  She had palpated a lump in her left breast and she was set up for mammography at the [facility] September 13, 2006.  The mammograms found an ill-defined mass in the upper left breast associated with calcifications.  The right breast was negative.  The left breast ultrasound found the mass to be approximately 2.6 cm, irregular and hypoechoic.  There were no obvious enlarged lymph nodes.  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  With this information the patient was referred to [name] and on September 7 bilateral breast MRIs were obtained.  This showed in the upper inner quadrant of the left breast a 2.5 cm enhancing mass and then more medial and inferior to that a 7 mm well-circumscribed nodule felt to likely represent an intramammary node.  In the right breast, there was a 7 mm nodule felt possibly to represent a small intramammary lymph node.  Accordingly, the patient had again bilateral breast ultrasounds on September 11.  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  The left breast axillary mass was also located, but the patient stated that she had decided she was going to have a left mastectomy and therefore this was not biopsied.  On October 16, 2006, the patient underwent left mastectomy with sentinel lymph node biopsy, both sentinel lymph nodes were negative.  There was also an intramammary lymph node, which was negative.  The breast mass itself measured 2.7 cm.  It was a grade 3 invasive ductal  carcinoma with evidence of lymphovascular invasion, but negative margins. Her subsequent history is as detailed below  INTERVAL HISTORY: Sabrina Holland returns today for followup of her breast cancer. Since her last visit here she had an episode of where her right arm and leg became weak. Symptoms cleared within 24 hours, and brain MRI and echocardiogram were negative. She was started on aspirin and her antihypertensives were resumed. Marland Kitchen  REVIEW OF SYSTEMS: Since she stopped her tamoxifen she has not noted any change in her hot flashes of. She continues to have mild dyspareunia. She was having some hair loss 2, likely also due to tamoxifen, and hopefully that will improve off the medication. She sleeps poorly. She is working full time right now. A detailed review of systems was otherwise noncontributory  PAST MEDICAL HISTORY: Past Medical History  Diagnosis Date  . Breast cancer   . TIA (transient ischemic attack) 11/25/2011    "first time" (11/25/2011)  status post tubal ligation, status post tonsillectomy and adenoidectomy and history of atypical chest pain evaluated in the emergency room April 18, 2006 by Dr. Estell Harpin.  The patient had an EKG showing normal sinus rhythm and a negative chest x-ray at that time.  Her troponin was less than 0.05.  PAST SURGICAL HISTORY: Past Surgical History  Procedure Laterality Date  . Tonsillectomy and adenoidectomy      "when I was little" (11/25/2011)  . Mastectomy  09/2005    left  . Breast biopsy  2007    left  .  Tubal ligation  1993    FAMILY HISTORY Family History  Problem Relation Age of Onset  . Heart disease Mother   The patient's  parents are alive  She has two brothers and one sister.  There is no history of breast or ovarian cancer in the family to her knowledge.  GYNECOLOGIC HISTORY: She is GX, P4, first pregnancy age 59. Was not menstruating on tamoxifen, resumed some menstruation on anastrozole, again stopped during tamoxifen but resumed when  tamoxifen was stopped November of 2013   SOCIAL HISTORY: She works at [employer] at [employer].  She has Medicaid, however.  She is single; at home there is her fianc [name] who works at [employer] and a son who is 20 who works in Personnel officer. The patient's daughters have moved out of the house. One of them is studying early childhood development at [employer].   ADVANCED DIRECTIVES: Not in place  HEALTH MAINTENANCE: History  Substance Use Topics  . Smoking status: Current Every Day Smoker -- 0.50 packs/day for 20 years    Types: Cigarettes  . Smokeless tobacco: Never Used  . Alcohol Use: 7.2 oz/week    12 Cans of beer per week     Comment: 11/25/2011 "40oz 4 times/wk"     Colonoscopy:  PAP:  Bone density:  Lipid panel:  Allergies  Allergen Reactions  . Penicillins Other (See Comments)    "I don't know; childhood allergy" (11/25/2011)    Current Outpatient Prescriptions  Medication Sig Dispense Refill  . amLODipine (NORVASC) 5 MG tablet Take 1 tablet (5 mg total) by mouth daily.  30 tablet  3  . aspirin (ASPIRIN EC) 81 MG EC tablet Take 1 tablet (81 mg total) by mouth daily. Swallow whole.  30 tablet  12  . azithromycin (ZITHROMAX Z-PAK) 250 MG tablet Take as directed on the pack  6 each  0  . cyanocobalamin (,VITAMIN B-12,) 1000 MCG/ML injection Inject 1,000 mcg into the muscle every 30 (thirty) days.      . Multiple Vitamin (MULITIVITAMIN WITH MINERALS) TABS Take 1 tablet by mouth daily.      . nicotine (NICODERM CQ - DOSED IN MG/24 HR) 7 mg/24hr patch Place 1 patch onto the skin daily.  28 patch  0  . traMADol (ULTRAM) 50 MG tablet Take 1 tablet (50 mg total) by mouth 3 (three) times daily.  90 tablet  3   No current facility-administered medications for this visit.    OBJECTIVE: Middle-aged [place] American woman who appears well Filed Vitals:   02/27/12 1133  BP: 115/74  Pulse: 96  Temp: 97.9 F (36.6 C)  Resp: 20     Body mass index is 21.64 kg/(m^2).     ECOG FS: 0  Sclerae unicteric Oropharynx clear No cervical or supraclavicular adenopathy Lungs no rales or rhonchi Heart regular rate and rhythm Abd benign MSK no focal spinal tenderness, no peripheral edema Neuro: No focal weakness, no sensory level; well oriented, positive affect Breasts: The right breast is unremarkable. The left breast is status post mastectomy. There is no evidence of local recurrence. The left axilla is clear  LAB RESULTS: Lab Results  Component Value Date   WBC 6.3 02/20/2012   NEUTROABS 2.7 02/20/2012   HGB 11.1* 02/20/2012   HCT 32.5* 02/20/2012   MCV 87.0 02/20/2012   PLT 399 02/20/2012      Chemistry      Component Value Date/Time   NA 137 02/20/2012 1546   NA  140 11/26/2011 0610   K 4.1 02/20/2012 1546   K 3.6 11/26/2011 0610   CL 105 02/20/2012 1546   CL 102 11/26/2011 0610   CO2 22 02/20/2012 1546   CO2 30 11/26/2011 0610   BUN 18.2 02/20/2012 1546   BUN 9 11/26/2011 0610   CREATININE 1.4* 02/20/2012 1546   CREATININE 0.70 11/26/2011 0610      Component Value Date/Time   CALCIUM 8.9 02/20/2012 1546   CALCIUM 9.4 11/26/2011 0610   ALKPHOS 86 02/20/2012 1546   ALKPHOS 93 11/26/2011 0610   AST 43* 02/20/2012 1546   AST 48* 11/26/2011 0610   ALT 38 02/20/2012 1546   ALT 38* 11/26/2011 0610   BILITOT <0.20 02/20/2012 1546   BILITOT 0.2* 11/26/2011 0610       Lab Results  Component Value Date   LABCA2 12 07/18/2011    No components found with this basename: [id number]    No results found for this basename: INR,  in the last 168 hours  Urinalysis    Component Value Date/Time   COLORURINE YELLOW 03/02/2009 0856   APPEARANCEUR CLEAR 03/02/2009 0856   LABSPEC 1.021 03/02/2009 0856   LABSPEC 1.015 11/29/2007 1119   PHURINE 5.0 03/02/2009 0856   GLUCOSEU NEGATIVE 03/02/2009 0856   HGBUR NEGATIVE 03/02/2009 0856   BILIRUBINUR NEGATIVE 03/02/2009 0856   KETONESUR NEGATIVE 03/02/2009 0856   PROTEINUR NEGATIVE 03/02/2009 0856   UROBILINOGEN 0.2 03/02/2009 0856   NITRITE NEGATIVE  03/02/2009 0856   LEUKOCYTESUR SMALL* 03/02/2009 0856    STUDIES: ------------------------------------------------------------ Transthoracic Echocardiography 11/25/2011   Patient: Dannetta, Lekas MR #: [id number] Study Date: 11/25/2011 Gender: F Age: 50 Height: 167.6cm Weight: 61.4kg BSA: 1.28m^2 Pt. Status: Room: OTFC  PERFORMING [place] ATTENDING Oti, Dyann Kief Oti, Christopher REFERRING Oti, Christopher SONOGRAPHER Nolon Rod, RDCS cc:  ------------------------------------------------------------ LV EF: 55% - 60%  MRI HEAD WITHOUT CONTRAST 11/25/2011 Technique: Multiplanar, multiecho pulse sequences of the brain and  surrounding structures were obtained according to standard protocol  without intravenous contrast.  Comparison: Normal CT head and CTA head.  Findings: There is no evidence for acute infarction, intracranial  hemorrhage, mass lesion, hydrocephalus, or extra-axial fluid. Mild  premature cerebral atrophy. Incidental cavum vergae cyst. Slight  abnormal white matter signal in the subcortical and periventricular  regions, likely post treatment effect from chemotherapy although  chronic microvascular ischemic change not excluded. No midline  shift. Normal pituitary and cerebellar tonsils. Upper cervical  spine region unremarkable although bilateral incompletely evaluated  cervical adenopathy is suspected.  Moderately extensive sphenoid sinus disease with retention cyst in  the right greater than left sphenoid compartments. Mucosal  thickening can be seen in both maxillary sinuses, throughout both  right and left ethmoids, and in the frontal sinuses. Negative  orbits. No mastoid fluid.  IMPRESSION:  No acute stroke or intracranial mass lesion is identified.  Slight premature atrophy and mild nonspecific white matter disease.  Chronic sinusitis most pronounced in the sphenoid region.   ASSESSMENT: 42 y.o. 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   (1) status-post adjuvant chemotherapy consisting of 4 dose-dense cycles of doxorubicin and cyclophosphamide.  (2)  on tamoxifen between January 2009 and  January 2013, when she was switched to letrozole, stopped due to side effects. Tamoxifen resumed 07/18/2011  PLAN: She has completed 5 years of antiestrogen therapy. Ideally she would continue another 5 years of tamoxifen, which would give her an additional 3-4% reduction in risk of recurrence. However tamoxifen can be April coagulant, just like estrogen, and give her her recent experience with a TIA I would vote against receiving that medication.  We discussed all that in detail, and agree that we will wait until she becomes postmenopausal and at that point consider aromatase inhibitors. She has had 2 periods since stopping tamoxifen. Recall that she is status post bilateral tubal ligation. I am going to obtain an LH and estradiol level before the next visit, and if she is clearly premenopausal and not perimenopausal we will discuss bilateral salpingo-oophorectomy at that time.  Lenae Wherley C    02/27/2012

## 2012-06-12 ENCOUNTER — Other Ambulatory Visit: Payer: Self-pay | Admitting: *Deleted

## 2012-06-12 DIAGNOSIS — C50912 Malignant neoplasm of unspecified site of left female breast: Secondary | ICD-10-CM

## 2012-06-13 ENCOUNTER — Telehealth: Payer: Self-pay | Admitting: *Deleted

## 2012-06-13 ENCOUNTER — Other Ambulatory Visit (HOSPITAL_BASED_OUTPATIENT_CLINIC_OR_DEPARTMENT_OTHER): Payer: Self-pay

## 2012-06-13 ENCOUNTER — Other Ambulatory Visit: Payer: Self-pay | Admitting: Physician Assistant

## 2012-06-13 DIAGNOSIS — C50912 Malignant neoplasm of unspecified site of left female breast: Secondary | ICD-10-CM

## 2012-06-13 DIAGNOSIS — Z853 Personal history of malignant neoplasm of breast: Secondary | ICD-10-CM

## 2012-06-13 DIAGNOSIS — R748 Abnormal levels of other serum enzymes: Secondary | ICD-10-CM

## 2012-06-13 LAB — COMPREHENSIVE METABOLIC PANEL (CC13)
ALT: 77 U/L — ABNORMAL HIGH (ref 0–55)
Alkaline Phosphatase: 76 U/L (ref 40–150)
CO2: 27 mEq/L (ref 22–29)
Creatinine: 0.9 mg/dL (ref 0.6–1.1)
Sodium: 142 mEq/L (ref 136–145)
Total Bilirubin: 0.46 mg/dL (ref 0.20–1.20)
Total Protein: 7.4 g/dL (ref 6.4–8.3)

## 2012-06-13 LAB — CBC WITH DIFFERENTIAL/PLATELET
BASO%: 1 % (ref 0.0–2.0)
Basophils Absolute: 0.1 10*3/uL (ref 0.0–0.1)
EOS%: 1.1 % (ref 0.0–7.0)
HGB: 12.6 g/dL (ref 11.6–15.9)
MCH: 28.7 pg (ref 25.1–34.0)
MCHC: 33.6 g/dL (ref 31.5–36.0)
MCV: 85.5 fL (ref 79.5–101.0)
MONO%: 10.5 % (ref 0.0–14.0)
RBC: 4.37 10*6/uL (ref 3.70–5.45)
RDW: 14.6 % — ABNORMAL HIGH (ref 11.2–14.5)
lymph#: 2.4 10*3/uL (ref 0.9–3.3)

## 2012-06-13 NOTE — Telephone Encounter (Signed)
sw pt gv a lab appt for 06/25/12 @10 :15am. Pt is aware.Sabrina Holland

## 2012-06-25 ENCOUNTER — Ambulatory Visit (HOSPITAL_BASED_OUTPATIENT_CLINIC_OR_DEPARTMENT_OTHER): Payer: Self-pay | Admitting: Physician Assistant

## 2012-06-25 ENCOUNTER — Encounter: Payer: Self-pay | Admitting: Physician Assistant

## 2012-06-25 ENCOUNTER — Telehealth: Payer: Self-pay | Admitting: Oncology

## 2012-06-25 ENCOUNTER — Other Ambulatory Visit (HOSPITAL_BASED_OUTPATIENT_CLINIC_OR_DEPARTMENT_OTHER): Payer: Self-pay | Admitting: Lab

## 2012-06-25 VITALS — BP 154/96 | HR 75 | Temp 98.3°F | Resp 20 | Ht 66.0 in | Wt 138.5 lb

## 2012-06-25 DIAGNOSIS — C50919 Malignant neoplasm of unspecified site of unspecified female breast: Secondary | ICD-10-CM

## 2012-06-25 DIAGNOSIS — D539 Nutritional anemia, unspecified: Secondary | ICD-10-CM

## 2012-06-25 DIAGNOSIS — Z853 Personal history of malignant neoplasm of breast: Secondary | ICD-10-CM

## 2012-06-25 DIAGNOSIS — G459 Transient cerebral ischemic attack, unspecified: Secondary | ICD-10-CM

## 2012-06-25 DIAGNOSIS — R748 Abnormal levels of other serum enzymes: Secondary | ICD-10-CM

## 2012-06-25 DIAGNOSIS — I10 Essential (primary) hypertension: Secondary | ICD-10-CM

## 2012-06-25 DIAGNOSIS — C50912 Malignant neoplasm of unspecified site of left female breast: Secondary | ICD-10-CM

## 2012-06-25 LAB — COMPREHENSIVE METABOLIC PANEL (CC13)
Alkaline Phosphatase: 72 U/L (ref 40–150)
BUN: 11 mg/dL (ref 7.0–26.0)
Creatinine: 0.9 mg/dL (ref 0.6–1.1)
Glucose: 96 mg/dl (ref 70–99)
Total Bilirubin: 0.47 mg/dL (ref 0.20–1.20)

## 2012-06-25 MED ORDER — AMLODIPINE BESYLATE 5 MG PO TABS: 5.0000 mg | ORAL_TABLET | Freq: Every day | ORAL | Status: DC

## 2012-06-25 NOTE — Progress Notes (Signed)
ID: Sabrina Holland   DOB: 1970-08-14  MR#: [id number]  WUJ#:[id number]  PCP: Pcp Not In System GYN: SU: [name] OTHER MD: [name]  HISTORY OF PRESENT ILLNESS: The patient presented to Urgent Care at [facility] with a complaint of toe pain.  She had palpated a lump in her left breast and she was set up for mammography at the [facility] September 13, 2006.  The mammograms found an ill-defined mass in the upper left breast associated with calcifications.  The right breast was negative.  The left breast ultrasound found the mass to be approximately 2.6 cm, irregular and hypoechoic.  There were no obvious enlarged lymph nodes.  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  With this information the patient was referred to [name] and on September 7 bilateral breast MRIs were obtained.  This showed in the upper inner quadrant of the left breast a 2.5 cm enhancing mass and then more medial and inferior to that a 7 mm well-circumscribed nodule felt to likely represent an intramammary node.  In the right breast, there was a 7 mm nodule felt possibly to represent a small intramammary lymph node.  Accordingly, the patient had again bilateral breast ultrasounds on September 11.  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  The left breast axillary mass was also located, but the patient stated that she had decided she was going to have a left mastectomy and therefore this was not biopsied.  On October 16, 2006, the patient underwent left mastectomy with sentinel lymph node biopsy, both sentinel lymph nodes were negative.  There was also an intramammary lymph node, which was negative.  The breast mass itself measured 2.7 cm.  It was a grade 3 invasive ductal  carcinoma with evidence of lymphovascular invasion, but negative margins. Her subsequent history is as detailed below  INTERVAL HISTORY: Sabrina Holland returns today accompanied by her daughter  [name] for followup of her left breast cancer. Recall that tamoxifen was discontinued in November 2013 after an apparent TIA. She's now off antiestrogen therapy, and is being followed with observation alone every 3-4 months.  Sabrina Holland is doing well overall. She's out of work for the summer. She admits that she has probably been drinking a little more than usual, which could explain the elevated liver enzymes. These were repeated today, however, and do show some improvement.  She was running low on her blood pressure medication, and has been taking the amlodipine proximally every other day. Her blood pressure is mildly elevated here in the office today at 154/96.   REVIEW OF SYSTEMS: Sabrina Holland denies any recent illnesses and has had no fevers or chills. She does have hot flashes, both during the day and at night. This sometimes interrupts her sleep. She's had no abnormal bruising or bleeding. Her last menstrual cycle was in February or March of this year. She's eating and drinking well, with no nausea or change in bowel or bladder habits. She denies any cough, shortness of breath, chest pain, palpitations. She notices some mild headaches with her blood pressure is elevated, but otherwise denies any dizziness or change in vision. She has occasional muscle spasms in the back, but denies any additional myalgias, arthralgias, or bony pain. She's had no peripheral swelling.  A detailed review of systems was otherwise noncontributory  PAST MEDICAL HISTORY: Past Medical History  Diagnosis Date  . Breast cancer   . TIA (transient ischemic attack) 11/25/2011    "first time" (11/25/2011)  status post tubal ligation, status post tonsillectomy and adenoidectomy and history of atypical chest pain evaluated in the emergency room  April 18, 2006 by Dr. Estell Harpin.  The patient had an EKG showing normal sinus rhythm and a negative chest x-ray at that time.  Her troponin was less than 0.05.  PAST SURGICAL HISTORY: Past Surgical History  Procedure Laterality Date  . Tonsillectomy and adenoidectomy      "when I was little" (11/25/2011)  . Mastectomy  09/2005    left  . Breast biopsy  2007    left  . Tubal ligation  1993    FAMILY HISTORY Family History  Problem Relation Age of Onset  . Heart disease Mother   The patient's  parents are alive  She has two brothers and one sister.  There is no history of breast or ovarian cancer in the family to her knowledge.  GYNECOLOGIC HISTORY: She is GX, P4, first pregnancy age 25. Was not menstruating on tamoxifen, resumed some menstruation on anastrozole, again stopped during tamoxifen but resumed when tamoxifen was stopped November of 2013   SOCIAL HISTORY: She works at [employer] at [employer].  She has Medicaid, however.  She is single; at home there is her fianc [name] who works at [employer] and a son who is 20 who works in Personnel officer. The patient's daughters have moved out of the house. One of them is studying early childhood development at [employer].   ADVANCED DIRECTIVES: Not in place  HEALTH MAINTENANCE: History  Substance Use Topics  . Smoking status: Current Every Day Smoker -- 0.50 packs/day for 20 years    Types: Cigarettes  . Smokeless tobacco: Never Used  . Alcohol Use: 7.2 oz/week    12 Cans of beer per week     Comment: 11/25/2011 "40oz 4 times/wk"     Colonoscopy:  PAP:  Bone density:  Lipid panel:  Allergies  Allergen Reactions  . Penicillins Other (See Comments)    "I don't know; childhood allergy" (11/25/2011)    Current Outpatient Prescriptions  Medication Sig Dispense Refill  . amLODipine (NORVASC) 5 MG tablet Take 1 tablet (5 mg total) by mouth daily.  30 tablet  3  . aspirin (ASPIRIN EC) 81 MG EC tablet Take 1 tablet (81 mg total) by  mouth daily. Swallow whole.  30 tablet  12  . Multiple Vitamin (MULITIVITAMIN WITH MINERALS) TABS Take 1 tablet by mouth daily.      . cyanocobalamin (,VITAMIN B-12,) 1000 MCG/ML injection Inject 1,000 mcg into the muscle every 30 (thirty) days.       No current facility-administered medications for this visit.    OBJECTIVE: Middle-aged [place] American woman who appears well Filed Vitals:   06/25/12 1042  BP: 154/96  Pulse: 75  Temp: 98.3 F (36.8 C)  Resp: 20     Body mass index is 22.37 kg/(m^2).    ECOG FS: 0 Filed Weights   06/25/12 1042  Weight: 138 lb 8 oz (62.823 kg)    Sclerae unicteric Oropharynx clear No cervical or supraclavicular adenopathy Lungs clear to auscultation, no rales or rhonchi Heart regular rate and rhythm Abdomen soft, nontender to palpation, positive bowel sounds MSK no focal spinal tenderness, no peripheral edema Neuro: Nonfocal; well oriented, positive affect  Breasts: The right breast is unremarkable. The left breast is status post mastectomy. There is no evidence of local recurrence. Axillae are benign bilaterally with no palpable adenopathy.   LAB RESULTS: Lab Results  Component Value Date   WBC 6.3 06/13/2012   NEUTROABS 3.1 06/13/2012   HGB 12.6 06/13/2012   HCT 37.4 06/13/2012   MCV 85.5 06/13/2012   PLT 363 06/13/2012      Chemistry      Component Value Date/Time   NA 140 06/25/2012 1031   NA 140 11/26/2011 0610   K 4.0 06/25/2012 1031   K 3.6 11/26/2011 0610   CL 106 06/25/2012 1031   CL 102 11/26/2011 0610   CO2 25 06/25/2012 1031   CO2 30 11/26/2011 0610   BUN 11.0 06/25/2012 1031   BUN 9 11/26/2011 0610   CREATININE 0.9 06/25/2012 1031   CREATININE 0.70 11/26/2011 0610      Component Value Date/Time   CALCIUM 9.7 06/25/2012 1031   CALCIUM 9.4 11/26/2011 0610   ALKPHOS 72 06/25/2012 1031   ALKPHOS 93 11/26/2011 0610   AST 53* 06/25/2012 1031   AST 48* 11/26/2011 0610   ALT 45 06/25/2012 1031   ALT 38* 11/26/2011 0610   BILITOT 0.47 06/25/2012 1031    BILITOT 0.2* 11/26/2011 0610       Lab Results  Component Value Date   LABCA2 12 07/18/2011    STUDIES:   This recent right mammogram on 10/12/2011 was unremarkable.   ASSESSMENT: 42 y.o. [place] woman status post left mastectomy and sentinel lymph node dissection September 2008 for a T2 N0, grade 3, strongly estrogen receptor positive, moderately progesterone receptor positive, HER2 negative invasive ductal carcinoma   (0) Oncotype recurrence score of 32, predicting a 25% risk of recurrence within 10 years if all she did was take tamoxifen for 5 years.   (1) status-post adjuvant chemotherapy consisting of 4 dose-dense cycles of doxorubicin and cyclophosphamide.  (2)  on tamoxifen between January 2009 and January 2013, when she was switched to letrozole, stopped due to side effects. Tamoxifen resumed 07/18/2011, then discontinued in November 2013 after an apparent TIA.  Currently off antiestrogen therapy.   PLAN:  Overall, Sabrina Holland seems to be doing quite well.  She is aware of her elevated liver enzymes, and will try to cut back on her alcohol intake which is likely the culprit.  Her estradiol level is still pending, but based on her other labs, and the fact that she has not had a menstrual cycle since February/March, it does look like she is perimenopausal. We will repeat those labs when she returns in 4 months and reassess.  She's also due for her next right mammogram in late September, and will see Dr. Darnelle Catalan for followup in October. All this was reviewed in detail with Sabrina Holland today who voices understanding and agreement with this plan.  She'll call with any problems prior to that visit.  Of note, patient is in the process of establishing herself with primary care physician (she hopes to make an appointment with Dr. Britt Bottom Blunt), and in the meanwhile, I have refilled her amlodipine, 5 mg daily.      Sabrina Holland    06/25/2012

## 2012-07-03 LAB — ESTRADIOL, ULTRA SENS: Estradiol, Ultra Sensitive: 13

## 2012-10-16 ENCOUNTER — Ambulatory Visit (HOSPITAL_COMMUNITY)
Admission: RE | Admit: 2012-10-16 | Discharge: 2012-10-16 | Disposition: A | Discharge status: Home / Self Care | Payer: Self-pay | Source: Ambulatory Visit | Attending: Physician Assistant | Admitting: Physician Assistant

## 2012-10-16 DIAGNOSIS — Z853 Personal history of malignant neoplasm of breast: Secondary | ICD-10-CM

## 2012-10-22 ENCOUNTER — Other Ambulatory Visit (HOSPITAL_BASED_OUTPATIENT_CLINIC_OR_DEPARTMENT_OTHER): Payer: Self-pay | Admitting: Lab

## 2012-10-22 DIAGNOSIS — C50912 Malignant neoplasm of unspecified site of left female breast: Secondary | ICD-10-CM

## 2012-10-22 DIAGNOSIS — C50919 Malignant neoplasm of unspecified site of unspecified female breast: Secondary | ICD-10-CM

## 2012-10-22 DIAGNOSIS — D539 Nutritional anemia, unspecified: Secondary | ICD-10-CM

## 2012-10-22 LAB — COMPREHENSIVE METABOLIC PANEL (CC13)
ALT: 60 U/L — ABNORMAL HIGH (ref 0–55)
AST: 60 U/L — ABNORMAL HIGH (ref 5–34)
CO2: 27 mEq/L (ref 22–29)
Calcium: 9.9 mg/dL (ref 8.4–10.4)
Chloride: 104 mEq/L (ref 98–109)
Creatinine: 1.1 mg/dL (ref 0.6–1.1)
Glucose: 107 mg/dl (ref 70–140)
Potassium: 4.5 mEq/L (ref 3.5–5.1)
Sodium: 139 mEq/L (ref 136–145)
Total Bilirubin: 0.28 mg/dL (ref 0.20–1.20)
Total Protein: 7.6 g/dL (ref 6.4–8.3)

## 2012-10-22 LAB — CBC WITH DIFFERENTIAL/PLATELET
BASO%: 1.3 % (ref 0.0–2.0)
Eosinophils Absolute: 0.1 10*3/uL (ref 0.0–0.5)
HGB: 12.3 g/dL (ref 11.6–15.9)
LYMPH%: 40 % (ref 14.0–49.7)
MCHC: 33 g/dL (ref 31.5–36.0)
MONO#: 0.7 10*3/uL (ref 0.1–0.9)
NEUT%: 47.7 % (ref 38.4–76.8)
lymph#: 2.8 10*3/uL (ref 0.9–3.3)

## 2012-10-23 LAB — LUTEINIZING HORMONE: LH: 59.7 m[IU]/mL

## 2012-10-23 LAB — FOLLICLE STIMULATING HORMONE: FSH: 113.8 m[IU]/mL

## 2012-10-23 LAB — VITAMIN B12: Vitamin B-12: 608 pg/mL (ref 211–911)

## 2012-11-12 ENCOUNTER — Ambulatory Visit (HOSPITAL_COMMUNITY)
Admission: RE | Admit: 2012-11-12 | Discharge: 2012-11-12 | Disposition: A | Discharge status: Home / Self Care | Payer: Self-pay | Source: Ambulatory Visit | Attending: Oncology | Admitting: Oncology

## 2012-11-12 ENCOUNTER — Telehealth: Payer: Self-pay | Admitting: Oncology

## 2012-11-12 ENCOUNTER — Ambulatory Visit (HOSPITAL_BASED_OUTPATIENT_CLINIC_OR_DEPARTMENT_OTHER): Payer: Self-pay | Admitting: Oncology

## 2012-11-12 VITALS — BP 148/91 | HR 92 | Temp 98.8°F | Resp 18 | Ht 66.0 in | Wt 142.8 lb

## 2012-11-12 DIAGNOSIS — C50412 Malignant neoplasm of upper-outer quadrant of left female breast: Secondary | ICD-10-CM

## 2012-11-12 DIAGNOSIS — M545 Low back pain, unspecified: Secondary | ICD-10-CM | POA: Insufficient documentation

## 2012-11-12 DIAGNOSIS — Z853 Personal history of malignant neoplasm of breast: Secondary | ICD-10-CM

## 2012-11-12 DIAGNOSIS — I10 Essential (primary) hypertension: Secondary | ICD-10-CM

## 2012-11-12 MED ORDER — AMLODIPINE BESYLATE 5 MG PO TABS: 5.0000 mg | ORAL_TABLET | Freq: Every day | ORAL | Status: DC

## 2012-11-12 NOTE — Progress Notes (Signed)
ID: Sabrina Holland   DOB: 09-15-1970  MR#: [id number]  WGN#:[id number]  PCP: Pcp Not In System GYN: SU: [name] OTHER MD: [name]  HISTORY OF PRESENT ILLNESS: The patient presented to Urgent Care at [facility] with a complaint of pain.  She had palpated a lump in her left breast and she was set up for mammography at the [facility] September 13, 2006.  The mammograms found an ill-defined mass in the upper left breast associated with calcifications.  The right breast was negative.  The left breast ultrasound found the mass to be approximately 2.6 cm, irregular and hypoechoic.  There were no obvious enlarged lymph nodes.  Biopsy was performed on September 19, 2006 and this showed ([id number]) and ([id number]) an invasive ductal carcinoma that was 96% ER positive, 10% PR positive, 0 for HercepTest with the proliferation marker elevated at 25%.  With this information the patient was referred to [name] and on September 7 bilateral breast MRIs were obtained.  This showed in the upper inner quadrant of the left breast a 2.5 cm enhancing mass and then more medial and inferior to that a 7 mm well-circumscribed nodule felt to likely represent an intramammary node.  In the right breast, there was a 7 mm nodule felt possibly to represent a small intramammary lymph node.  Accordingly, the patient had again bilateral breast ultrasounds on September 11.  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  The left breast axillary mass was also located, but the patient stated that she had decided she was going to have a left mastectomy and therefore this was not biopsied.  On October 16, 2006, the patient underwent left mastectomy with sentinel lymph node biopsy, both sentinel lymph nodes were negative.  There was also an intramammary lymph node, which was negative.  The breast mass itself measured 2.7 cm.  It was a grade 3 invasive ductal carcinoma  with evidence of lymphovascular invasion, but negative margins. Her subsequent history is as detailed below  INTERVAL HISTORY: Sabrina Holland returns today for followup of her breast cancer. The interval history is generally stable. She has been off anti-estrogens without any new symptoms developing or for that matter any old symptoms resolving.  REVIEW OF SYSTEMS: Lissete complains of severe low back pain. This has been present for quite a while, but it has been bothering her more lately. It is not constant. She requested that I write a note for work today excuse in her from sleeping duties, and I was glad to do that. I am also scheduling her for lumbar spine films. She has not had a B12 shot for several months. That apparently just "dropped off". She is concerned about her liver function tests, which have been elevated in the past. She has about 45 beers a week, usually over the weekend, currently. She never established yourself with a PCP. Otherwise a detailed review of systems today was noncontributory  PAST MEDICAL HISTORY: Past Medical History  Diagnosis Date  . Breast cancer   . TIA (transient ischemic attack) 11/25/2011    "first time" (11/25/2011)  status post tubal ligation, status post tonsillectomy and adenoidectomy and history of atypical chest pain evaluated in the emergency room April 18, 2006 by Dr. Estell Harpin.  The patient had an EKG showing normal sinus rhythm and a negative chest x-ray at that time.  Her troponin was less than 0.05.  PAST SURGICAL HISTORY: Past Surgical History  Procedure Laterality Date  . Tonsillectomy and adenoidectomy      "when I was little" (11/25/2011)  . Mastectomy  09/2005    left  . Breast biopsy  2007    left  . Tubal ligation  1993    FAMILY HISTORY Family History  Problem Relation Age of Onset  . Heart disease Mother   The patient's  parents are alive  She has two brothers and one sister.  There is no history of breast or ovarian cancer in the family to  her knowledge.  GYNECOLOGIC HISTORY: She is GX, P4, first pregnancy age 27. Was not menstruating on tamoxifen, resumed some menstruation on anastrozole, again stopped during tamoxifen but resumed when tamoxifen was stopped November of 2013   SOCIAL HISTORY: She works at [employer] at [employer].  She has Medicaid, however.  She is single; at home there is her fianc [name] who works at [employer] and a son who is 20 who works in Personnel officer. The patient's daughters have moved out of the house. One of them is studying early childhood development at [employer].   ADVANCED DIRECTIVES: Not in place  HEALTH MAINTENANCE: History  Substance Use Topics  . Smoking status: Current Every Day Smoker -- 0.50 packs/day for 20 years    Types: Cigarettes  . Smokeless tobacco: Never Used  . Alcohol Use: 7.2 oz/week    12 Cans of beer per week     Comment: 11/25/2011 "40oz 4 times/wk"     Colonoscopy:  PAP:  Bone density:  Lipid panel:  Allergies  Allergen Reactions  . Penicillins Other (See Comments)    "I don't know; childhood allergy" (11/25/2011)    Current Outpatient Prescriptions  Medication Sig Dispense Refill  . amLODipine (NORVASC) 5 MG tablet Take 1 tablet (5 mg total) by mouth daily.  30 tablet  3  . aspirin (ASPIRIN EC) 81 MG EC tablet Take 1 tablet (81 mg total) by mouth daily. Swallow whole.  30 tablet  12  . cyanocobalamin (,VITAMIN B-12,) 1000 MCG/ML injection Inject 1,000 mcg into the muscle every 30 (thirty) days.      . Multiple Vitamin (MULITIVITAMIN WITH MINERALS) TABS Take 1 tablet by mouth daily.       No current facility-administered medications for this visit.    OBJECTIVE: Middle-aged [place] American woman who appears stated age 3 Vitals:   11/12/12 1509  BP: 148/91  Pulse: 92  Temp: 98.8 F (37.1 C)  Resp: 18     Body mass index is 23.06 kg/(m^2).    ECOG FS: 1 Filed Weights   11/12/12 1509  Weight: 142 lb 12.8 oz (64.774 kg)    Sclerae  unicteric Oropharynx clear No cervical or supraclavicular adenopathy Lungs clear to auscultation, no rales or rhonchi Heart regular rate and rhythm Abdomen soft, nontender, positive bowel sounds MSK moderate spinal tenderness over the lumbar area, no upper extremity lymphedema Neuro: Nonfocal; well oriented, appropriate affect Breasts: The right breast is unremarkable. The left breast is status post mastectomy. There is no evidence of local recurrence. The left axilla is benign  LAB RESULTS: Lab Results  Component Value Date   WBC 7.0 10/22/2012   NEUTROABS 3.3 10/22/2012   HGB 12.3 10/22/2012   HCT 37.1 10/22/2012   MCV 86.3 10/22/2012   PLT 353 10/22/2012      Chemistry      Component Value Date/Time   NA 139 10/22/2012 1546   NA 140 11/26/2011 0610   K  4.5 10/22/2012 1546   K 3.6 11/26/2011 0610   CL 106 06/25/2012 1031   CL 102 11/26/2011 0610   CO2 27 10/22/2012 1546   CO2 30 11/26/2011 0610   BUN 17.3 10/22/2012 1546   BUN 9 11/26/2011 0610   CREATININE 1.1 10/22/2012 1546   CREATININE 0.70 11/26/2011 0610      Component Value Date/Time   CALCIUM 9.9 10/22/2012 1546   CALCIUM 9.4 11/26/2011 0610   ALKPHOS 83 10/22/2012 1546   ALKPHOS 93 11/26/2011 0610   AST 60* 10/22/2012 1546   AST 48* 11/26/2011 0610   ALT 60* 10/22/2012 1546   ALT 38* 11/26/2011 0610   BILITOT 0.28 10/22/2012 1546   BILITOT 0.2* 11/26/2011 0610       Lab Results  Component Value Date   LABCA2 12 07/18/2011    STUDIES: Ms Digital Screening Unilat R  10/17/2012   *RADIOLOGY REPORT*  Clinical Data: Screening.  DIGITAL SCREENING UNILATERAL RIGHT MAMMOGRAM WITH CAD  Comparison:  Previous exam(s).  FINDINGS:  ACR Breast Density Category c:  The breast tissue is heterogeneously dense, which may obscure small masses.  There are no findings suspicious for malignancy.  Images were processed with CAD.  IMPRESSION: No mammographic evidence of malignancy.  A result letter of this screening mammogram will be mailed directly  to the patient.  RECOMMENDATION: Screening mammogram in one year. (Code:SM-B-01Y)  BI-RADS CATEGORY 1:  Negative.   Original Report Authenticated By: Baird Lyons, M.D.   ASSESSMENT: 42 y.o. [place] woman status post left mastectomy and sentinel lymph node dissection September 2008 for a T2 N0, grade 3, strongly estrogen receptor positive, moderately progesterone receptor positive, HER2 negative invasive ductal carcinoma   (0) Oncotype recurrence score of 32, predicting a 25% risk of recurrence within 10 years if all she did was take tamoxifen for 5 years.   (1) status-post adjuvant chemotherapy consisting of 4 dose-dense cycles of doxorubicin and cyclophosphamide.  (2)  on tamoxifen between January 2009 and January 2013, when she was switched to letrozole, stopped due to side effects. Tamoxifen resumed 07/18/2011, then discontinued in November 2013 after an apparent TIA.  Aromatase inhibitors were not resumed  (3) B-12 deficiency   PLAN:  Reviewing Chriselda's records I do not find a BRCA test him a possibly because of insurance reasons. I am asking our geneticist to look into that.  Even though her lower back pain has been present for some time, it is now more acute and I am obtaining plain films of the lumbar spine today.  I am not convinced she has an actual B12 deficiency. She has been off treatment for some time with no evidence of anemia. I am going to obtain lab work regarding that next Thursday and I have also set her up for a B12 shot at the same time.  I have encouraged her to establish yourself with a primary care physician. Until then we will continue to refill her blood pressure and other medications. She knows to call for any problems that may develop before her next visit here, which will be in one year.   MAGRINAT,GUSTAV C    11/12/2012

## 2012-11-14 ENCOUNTER — Telehealth: Payer: Self-pay | Admitting: *Deleted

## 2012-11-14 NOTE — Telephone Encounter (Signed)
Confirmed 01/03/13 genetic appt w/ pt.  

## 2012-11-22 ENCOUNTER — Other Ambulatory Visit: Payer: Self-pay | Admitting: Lab

## 2012-11-22 ENCOUNTER — Ambulatory Visit: Payer: Self-pay

## 2012-11-26 ENCOUNTER — Ambulatory Visit (HOSPITAL_BASED_OUTPATIENT_CLINIC_OR_DEPARTMENT_OTHER): Payer: Self-pay

## 2012-11-26 ENCOUNTER — Other Ambulatory Visit (HOSPITAL_BASED_OUTPATIENT_CLINIC_OR_DEPARTMENT_OTHER): Payer: Self-pay

## 2012-11-26 VITALS — BP 144/93 | HR 80 | Temp 97.8°F | Resp 18

## 2012-11-26 DIAGNOSIS — Z853 Personal history of malignant neoplasm of breast: Secondary | ICD-10-CM

## 2012-11-26 DIAGNOSIS — C50412 Malignant neoplasm of upper-outer quadrant of left female breast: Secondary | ICD-10-CM

## 2012-11-26 DIAGNOSIS — E538 Deficiency of other specified B group vitamins: Secondary | ICD-10-CM

## 2012-11-26 LAB — CBC WITH DIFFERENTIAL/PLATELET
BASO%: 1.4 % (ref 0.0–2.0)
EOS%: 2.2 % (ref 0.0–7.0)
HCT: 37.1 % (ref 34.8–46.6)
HGB: 12.2 g/dL (ref 11.6–15.9)
LYMPH%: 39 % (ref 14.0–49.7)
MCH: 28.4 pg (ref 25.1–34.0)
MCHC: 32.9 g/dL (ref 31.5–36.0)
MCV: 86.4 fL (ref 79.5–101.0)
MONO#: 0.6 10*3/uL (ref 0.1–0.9)
NEUT%: 45.6 % (ref 38.4–76.8)
RBC: 4.3 10*6/uL (ref 3.70–5.45)
RDW: 14.2 % (ref 11.2–14.5)
WBC: 5 10*3/uL (ref 3.9–10.3)
lymph#: 1.9 10*3/uL (ref 0.9–3.3)

## 2012-11-26 MED ORDER — CYANOCOBALAMIN 1000 MCG/ML IJ SOLN
1000.0000 ug | Freq: Once | INTRAMUSCULAR | Status: AC
  Administered 2012-11-26: 1000 ug via INTRAMUSCULAR

## 2012-11-26 MED ORDER — CYANOCOBALAMIN 1000 MCG/ML IJ SOLN
INTRAMUSCULAR | Status: AC
  Filled 2012-11-26: qty 1

## 2012-11-28 LAB — VITAMIN B12: Vitamin B-12: 746 pg/mL (ref 211–911)

## 2012-11-28 LAB — METHYLMALONIC ACID, SERUM: Methylmalonic Acid, Quant: 0.15 umol/L (ref <0.40)

## 2013-01-03 ENCOUNTER — Encounter: Payer: Self-pay | Admitting: Genetic Counselor

## 2013-01-03 ENCOUNTER — Ambulatory Visit (HOSPITAL_BASED_OUTPATIENT_CLINIC_OR_DEPARTMENT_OTHER): Payer: Self-pay | Admitting: Genetic Counselor

## 2013-01-03 ENCOUNTER — Other Ambulatory Visit: Payer: Self-pay

## 2013-01-03 DIAGNOSIS — Z853 Personal history of malignant neoplasm of breast: Secondary | ICD-10-CM

## 2013-01-03 DIAGNOSIS — IMO0002 Reserved for concepts with insufficient information to code with codable children: Secondary | ICD-10-CM

## 2013-01-03 NOTE — Progress Notes (Signed)
Dr.  Raymond Gurney Magrinat requested a consultation for genetic counseling and risk assessment for Sabrina Holland, a 42 y.o. female, for discussion of her personal history of breast cancer.  She presents to clinic today to discuss the possibility of a genetic predisposition to cancer, and to further clarify her risks, as well as her family members' risks for cancer.   HISTORY OF PRESENT ILLNESS: In 2007, at the age of 21, Sabrina Holland was diagnosed with invasive ductal carcinoma of the left breast. This was treated with unilateral mastectomy, and chemotherapy, but no radiation. She was on tamoxifen for 5 years.  The tumor was ER+/PR+/Her2-.      Past Medical History  Diagnosis Date  . Breast cancer   . TIA (transient ischemic attack) 11/25/2011    "first time" (11/25/2011)    Past Surgical History  Procedure Laterality Date  . Tonsillectomy and adenoidectomy      "when I was little" (11/25/2011)  . Mastectomy  09/2005    left  . Breast biopsy  2007    left  . Tubal ligation  1993    History   Social History  . Marital Status: Single    Spouse Name: N/A    Number of Children: 4  . Years of Education: N/A   Occupational History  .  [employer]   Social History Main Topics  . Smoking status: Current Every Day Smoker -- 0.50 packs/day for 20 years    Types: Cigarettes  . Smokeless tobacco: Never Used  . Alcohol Use: 7.2 oz/week    12 Cans of beer per week     Comment: 11/25/2011 "40oz 4 times/wk"  . Drug Use: Yes    Special: Marijuana     Comment: 11/25/2011 "last marijuana was sometime last week"  . Sexual Activity: Yes   Other Topics Concern  . None   Social History Narrative  . None    REPRODUCTIVE HISTORY AND PERSONAL RISK ASSESSMENT FACTORS: Menarche was at age 71-16.   postmenopausal Uterus Intact: yes Ovaries Intact: yes [id number], first live birth at age 33  She has not previously undergone treatment for infertility.   Oral Contraceptive use: 1 years    She has not used HRT in the past.    FAMILY HISTORY:  We obtained a detailed, 4-generation family history.  Significant diagnoses are listed below: Family History  Problem Relation Age of Onset  . Heart disease Mother   . Sickle cell anemia Cousin     maternal cousin  The patient has three maternal half siblings and four paternal half siblings.  None of whom have cancer.  There are multiple aunts and uncles who do not have cancer.  One maternal cousin has sickle cell anemia.  Patient's maternal ancestors are of African American descent, and paternal ancestors are of African American descent. There is no reported Ashkenazi Jewish ancestry. There is no known consanguinity.  GENETIC COUNSELING ASSESSMENT: Sabrina Holland is a 42 y.o. female with a personal history of early onset breast cancer which somewhat suggestive of a hereditary cancer syndrome and predisposition to cancer. We, therefore, discussed and recommended the following at today's visit.   DISCUSSION: We reviewed the characteristics, features and inheritance patterns of hereditary cancer syndromes. We also discussed genetic testing, including the appropriate family members to test, the process of testing, insurance coverage and turn-around-time for results. We reviewed several cancer syndromes, and discussed that there are a few conditions where an individual has a de novo mutation,  and therefore their descendents would be at risk.  She does not have health insurance but she meets the criteria for [facility]'s financial assistance program.  PLAN: After considering the risks, benefits, and limitations, Sabrina Holland provided informed consent to pursue genetic testing and the blood sample will be sent to [facility] for analysis of the BRCA Myrisk. We discussed the implications of a positive, negative and/ or variant of uncertain significance genetic test result. Results should be available within approximately 3-4 weeks'  time, at which point they will be disclosed by telephone to Sabrina Holland, as will any additional recommendations warranted by these results. Sabrina Holland will receive a summary of her genetic counseling visit and a copy of her results once available. This information will also be available in Epic. We encouraged Sabrina Holland to remain in contact with cancer genetics annually so that we can continuously update the family history and inform her of any changes in cancer genetics and testing that may be of benefit for her family. Sabrina Holland's questions were answered to her satisfaction today. Our contact information was provided should additional questions or concerns arise.  The patient was seen for a total of 60 minutes, greater than 50% of which was spent face-to-face counseling.  This note will also be sent to the referring provider via the electronic medical record. The patient will be supplied with a summary of this genetic counseling discussion as well as educational information on the discussed hereditary cancer syndromes following the conclusion of their visit.   Patient was discussed with Dr. Drue Second.   _______________________________________________________________________ For Office Staff:  Number of people involved in session: 1 Was an Intern/ student involved with case: no

## 2013-01-31 ENCOUNTER — Telehealth: Payer: Self-pay | Admitting: Genetic Counselor

## 2013-01-31 NOTE — Telephone Encounter (Signed)
Left good news message on machine. 

## 2013-02-08 ENCOUNTER — Telehealth: Payer: Self-pay | Admitting: Genetic Counselor

## 2013-02-08 ENCOUNTER — Encounter: Payer: Self-pay | Admitting: Genetic Counselor

## 2013-02-08 NOTE — Telephone Encounter (Signed)
Revealed negative genetic testing on [facility] My[facility] testing.

## 2013-05-13 ENCOUNTER — Emergency Department (HOSPITAL_COMMUNITY)
Admission: EM | Admit: 2013-05-13 | Discharge: 2013-05-13 | Disposition: A | Discharge status: Home / Self Care | Payer: Self-pay | Attending: Emergency Medicine | Admitting: Emergency Medicine

## 2013-05-13 ENCOUNTER — Emergency Department (HOSPITAL_COMMUNITY): Payer: Self-pay

## 2013-05-13 ENCOUNTER — Encounter (HOSPITAL_COMMUNITY): Payer: Self-pay | Admitting: Emergency Medicine

## 2013-05-13 DIAGNOSIS — Z853 Personal history of malignant neoplasm of breast: Secondary | ICD-10-CM | POA: Insufficient documentation

## 2013-05-13 DIAGNOSIS — Z880 Allergy status to penicillin: Secondary | ICD-10-CM | POA: Insufficient documentation

## 2013-05-13 DIAGNOSIS — Z9089 Acquired absence of other organs: Secondary | ICD-10-CM | POA: Insufficient documentation

## 2013-05-13 DIAGNOSIS — R071 Chest pain on breathing: Secondary | ICD-10-CM | POA: Insufficient documentation

## 2013-05-13 DIAGNOSIS — R05 Cough: Secondary | ICD-10-CM | POA: Insufficient documentation

## 2013-05-13 DIAGNOSIS — F17200 Nicotine dependence, unspecified, uncomplicated: Secondary | ICD-10-CM | POA: Insufficient documentation

## 2013-05-13 DIAGNOSIS — R0789 Other chest pain: Secondary | ICD-10-CM

## 2013-05-13 DIAGNOSIS — R059 Cough, unspecified: Secondary | ICD-10-CM

## 2013-05-13 DIAGNOSIS — Z9889 Other specified postprocedural states: Secondary | ICD-10-CM | POA: Insufficient documentation

## 2013-05-13 DIAGNOSIS — Z8673 Personal history of transient ischemic attack (TIA), and cerebral infarction without residual deficits: Secondary | ICD-10-CM | POA: Insufficient documentation

## 2013-05-13 LAB — I-STAT CHEM 8, ED
BUN: 11 mg/dL (ref 6–23)
CREATININE: 1 mg/dL (ref 0.50–1.10)
Calcium, Ion: 1.12 mmol/L (ref 1.12–1.23)
Chloride: 102 mEq/L (ref 96–112)
Glucose, Bld: 87 mg/dL (ref 70–99)
HCT: 40 % (ref 36.0–46.0)
HEMOGLOBIN: 13.6 g/dL (ref 12.0–15.0)
Potassium: 4.3 mEq/L (ref 3.7–5.3)
Sodium: 138 mEq/L (ref 137–147)
TCO2: 23 mmol/L (ref 0–100)

## 2013-05-13 LAB — CBC
HCT: 35.5 % — ABNORMAL LOW (ref 36.0–46.0)
HEMOGLOBIN: 12.4 g/dL (ref 12.0–15.0)
MCH: 28.3 pg (ref 26.0–34.0)
MCHC: 34.9 g/dL (ref 30.0–36.0)
MCV: 81.1 fL (ref 78.0–100.0)
Platelets: 305 10*3/uL (ref 150–400)
RBC: 4.38 MIL/uL (ref 3.87–5.11)
RDW: 14.8 % (ref 11.5–15.5)
WBC: 6.1 10*3/uL (ref 4.0–10.5)

## 2013-05-13 LAB — COMPREHENSIVE METABOLIC PANEL
ALBUMIN: 3.2 g/dL — AB (ref 3.5–5.2)
ALK PHOS: 105 U/L (ref 39–117)
ALT: 53 U/L — AB (ref 0–35)
AST: 87 U/L — ABNORMAL HIGH (ref 0–37)
BILIRUBIN TOTAL: 0.3 mg/dL (ref 0.3–1.2)
BUN: 12 mg/dL (ref 6–23)
CO2: 23 mEq/L (ref 19–32)
Calcium: 9 mg/dL (ref 8.4–10.5)
Chloride: 100 mEq/L (ref 96–112)
Creatinine, Ser: 0.9 mg/dL (ref 0.50–1.10)
GFR calc Af Amer: 89 mL/min — ABNORMAL LOW (ref >90)
GFR calc non Af Amer: 77 mL/min — ABNORMAL LOW (ref >90)
Glucose, Bld: 86 mg/dL (ref 70–99)
POTASSIUM: 4.5 meq/L (ref 3.7–5.3)
SODIUM: 137 meq/L (ref 137–147)
Total Protein: 7.3 g/dL (ref 6.0–8.3)

## 2013-05-13 LAB — I-STAT TROPONIN, ED: Troponin i, poc: 0.01 ng/mL (ref 0.00–0.08)

## 2013-05-13 MED ORDER — HYDROCODONE-ACETAMINOPHEN 5-325 MG PO TABS: 1.0000 | ORAL_TABLET | ORAL | Status: DC | PRN

## 2013-05-13 MED ORDER — OXYCODONE-ACETAMINOPHEN 5-325 MG PO TABS
2.0000 | ORAL_TABLET | Freq: Once | ORAL | Status: AC
  Administered 2013-05-13: 2 via ORAL
  Filled 2013-05-13: qty 2

## 2013-05-13 NOTE — Discharge Planning (Signed)
Q9IH Sabrina Holland, Community Liaison  Spoke to patient about primary care resources and establishing care with a provider. Patient was given the orange card and instructed to call once application was completed for an appointment. Resource guide and my contact information was also provided.

## 2013-05-13 NOTE — ED Notes (Signed)
MD at bedside. 

## 2013-05-13 NOTE — ED Notes (Addendum)
43 yo female via [facility] c/o Left sided Chest Wall pain, Lower middle back pain and cough. Pt reports persistent cough for three weeks and chest pain starting last night around 930pm. Cough is productive with green mucous. Reports SOB, Dizziness and diarrhea in the middle of the night. Pt has not taken anything for diarrhea.  HX of Breast CA. Left Mastectomy, HTN and Stroke. Reports drinking 3 40 oz beers yesterday.   vitals  148/90 Hr 90

## 2013-05-13 NOTE — ED Notes (Signed)
Pt requesting to leave AMA due to having a hunger headache. Pt. Refused sandwich tray from ED. EDP notified.

## 2013-05-13 NOTE — ED Provider Notes (Signed)
CSN: [id number]     Arrival date & time 05/13/13  0902 History   First MD Initiated Contact with Patient 05/13/13 0914     Chief Complaint  Patient presents with  . Chest Pain  . Cough     (Consider location/radiation/quality/duration/timing/severity/associated sxs/prior Treatment) Patient is a 43 y.o. female presenting with chest pain.  Chest Pain Pain location:  L chest Pain quality: sharp   Pain radiates to:  Does not radiate Pain severity:  Severe Onset quality:  Gradual Duration:  12 hours Timing:  Constant Progression:  Unchanged Chronicity:  New Context comment:   history of respiratory infection which seems to be improving, but still has moderate cough Relieved by:  None tried Worsened by:  Coughing, certain positions and movement Associated symptoms: cough (green sputum)   Associated symptoms: no fever, no lower extremity edema, no nausea and no shortness of breath     Past Medical History  Diagnosis Date  . Breast cancer   . TIA (transient ischemic attack) 11/25/2011    "first time" (11/25/2011)   Past Surgical History  Procedure Laterality Date  . Tonsillectomy and adenoidectomy      "when I was little" (11/25/2011)  . Mastectomy  09/2005    left  . Breast biopsy  2007    left  . Tubal ligation  1993   Family History  Problem Relation Age of Onset  . Heart disease Mother   . Sickle cell anemia Cousin     maternal cousin   History  Substance Use Topics  . Smoking status: Current Every Day Smoker -- 0.50 packs/day for 20 years    Types: Cigarettes  . Smokeless tobacco: Never Used  . Alcohol Use: 7.2 oz/week    12 Cans of beer per week     Comment: 11/25/2011 "40oz 4 times/wk"   OB History   Grav Para Term Preterm Abortions TAB SAB Ect Mult Living   5 4 4  1  1   4      Review of Systems  Constitutional: Negative for fever.  Respiratory: Positive for cough (green sputum). Negative for shortness of breath.   Cardiovascular: Positive for chest pain.   Gastrointestinal: Negative for nausea.  All other systems reviewed and are negative.     Allergies  Penicillins  Home Medications   Prior to Admission medications   Medication Sig Start Date End Date Taking? Authorizing Provider  Multiple Vitamin (MULITIVITAMIN WITH MINERALS) TABS Take 1 tablet by mouth daily.   Yes Historical Provider, MD   BP 155/98  Pulse 92  Temp(Src) 99.5 F (37.5 C) (Oral)  Resp 17  Ht 5\' 7"  (1.702 m)  Wt 152 lb (68.947 kg)  BMI 23.80 kg/m2  SpO2 100% Physical Exam  Nursing note and vitals reviewed. Constitutional: She is oriented to person, place, and time. She appears well-developed and well-nourished. No distress.  HENT:  Head: Normocephalic and atraumatic.  Mouth/Throat: Oropharynx is clear and moist.  Eyes: Conjunctivae are normal. Pupils are equal, round, and reactive to light. No scleral icterus.  Neck: Neck supple.  Cardiovascular: Normal rate, regular rhythm, normal heart sounds and intact distal pulses.   No murmur heard. Pulmonary/Chest: Effort normal and breath sounds normal. No stridor. No respiratory distress. She has no rales. She exhibits tenderness and bony tenderness. She exhibits no crepitus.  Abdominal: Soft. Bowel sounds are normal. She exhibits no distension. There is no tenderness.  Musculoskeletal: Normal range of motion.  Neurological: She is alert and oriented to  person, place, and time.  Skin: Skin is warm and dry. No rash noted.  Psychiatric: She has a normal mood and affect. Her behavior is normal.    ED Course  Procedures (including critical care time) Labs Review Labs Reviewed  CBC - Abnormal; Notable for the following:    HCT 35.5 (*)    All other components within normal limits  COMPREHENSIVE METABOLIC PANEL  I-STAT CHEM 8, ED  Randolm Idol, ED    Imaging Review Dg Chest 2 View  05/13/2013   CLINICAL DATA:  Chest pain, cough.  EXAM: CHEST  2 VIEW  COMPARISON:  March 17, 2010.  FINDINGS: The heart  size and mediastinal contours are within normal limits. Both lungs are clear. No pneumothorax or pleural effusion is noted. Left axillary surgical clips are noted. The visualized skeletal structures are unremarkable.  IMPRESSION: No acute cardiopulmonary abnormality seen.   Electronically Signed   By: Sabino Dick M.D.   On: 05/13/2013 11:27  All radiology studies independently viewed by me.      EKG Interpretation   Date/Time:  Monday May 13 2013 09:09:51 EDT Ventricular Rate:  97 PR Interval:  151 QRS Duration: 75 QT Interval:  388 QTC Calculation: 493 R Axis:   59 Text Interpretation:  Sinus rhythm Probable left atrial enlargement  Probable left ventricular hypertrophy Borderline prolonged QT interval  Baseline wander in lead(s) V6 No significant change was found Confirmed by  [facility]  MD, TREY ([id number]) on 05/13/2013 10:03:51 AM      MDM   Final diagnoses:  Chest wall pain  Cough    43 year old female presenting with left sided chest pain.  This is in the setting of a cough for the past few weeks.  Her exam and presentation are consistent with musculoskeletal chest pain.  Her presentation is not consistent with PE or ACS.  Chest x-ray is clear.  She reports that her symptoms of cough and URI or improving.  Therefore, despite two-week duration, do not think she needs antibiotics for this likely viral process.  Return precautions given.    Houston Siren III, MD 05/13/13 [phone]

## 2013-05-13 NOTE — Discharge Instructions (Signed)

## 2013-05-13 NOTE — ED Notes (Signed)
Informed Brandi,RN, that pt states she's in a lot of pain

## 2013-10-17 ENCOUNTER — Other Ambulatory Visit: Payer: Self-pay

## 2013-10-17 DIAGNOSIS — Z1239 Encounter for other screening for malignant neoplasm of breast: Secondary | ICD-10-CM

## 2013-10-25 ENCOUNTER — Ambulatory Visit: Payer: Self-pay

## 2013-11-04 ENCOUNTER — Telehealth: Payer: Self-pay | Admitting: Nurse Practitioner

## 2013-11-04 ENCOUNTER — Other Ambulatory Visit (HOSPITAL_BASED_OUTPATIENT_CLINIC_OR_DEPARTMENT_OTHER): Payer: Self-pay

## 2013-11-04 DIAGNOSIS — C50412 Malignant neoplasm of upper-outer quadrant of left female breast: Secondary | ICD-10-CM

## 2013-11-04 DIAGNOSIS — Z853 Personal history of malignant neoplasm of breast: Secondary | ICD-10-CM

## 2013-11-04 LAB — CBC WITH DIFFERENTIAL/PLATELET
BASO%: 0.3 % (ref 0.0–2.0)
BASOS ABS: 0 10*3/uL (ref 0.0–0.1)
EOS ABS: 0 10*3/uL (ref 0.0–0.5)
EOS%: 0.4 % (ref 0.0–7.0)
HCT: 37.8 % (ref 34.8–46.6)
HGB: 12.6 g/dL (ref 11.6–15.9)
LYMPH%: 44.3 % (ref 14.0–49.7)
MCH: 28.3 pg (ref 25.1–34.0)
MCHC: 33.3 g/dL (ref 31.5–36.0)
MCV: 84.9 fL (ref 79.5–101.0)
MONO#: 0.6 10*3/uL (ref 0.1–0.9)
MONO%: 8.7 % (ref 0.0–14.0)
NEUT#: 3.4 10*3/uL (ref 1.5–6.5)
NEUT%: 46.3 % (ref 38.4–76.8)
PLATELETS: 346 10*3/uL (ref 145–400)
RBC: 4.45 10*6/uL (ref 3.70–5.45)
RDW: 15.6 % — ABNORMAL HIGH (ref 11.2–14.5)
WBC: 7.3 10*3/uL (ref 3.9–10.3)
lymph#: 3.2 10*3/uL (ref 0.9–3.3)

## 2013-11-04 LAB — COMPREHENSIVE METABOLIC PANEL (CC13)
ALK PHOS: 87 U/L (ref 40–150)
ALT: 79 U/L — ABNORMAL HIGH (ref 0–55)
AST: 88 U/L — AB (ref 5–34)
Albumin: 3.8 g/dL (ref 3.5–5.0)
Anion Gap: 10 mEq/L (ref 3–11)
BUN: 16.5 mg/dL (ref 7.0–26.0)
CALCIUM: 9.9 mg/dL (ref 8.4–10.4)
CO2: 26 mEq/L (ref 22–29)
CREATININE: 1.2 mg/dL — AB (ref 0.6–1.1)
Chloride: 106 mEq/L (ref 98–109)
GLUCOSE: 116 mg/dL (ref 70–140)
Potassium: 3.6 mEq/L (ref 3.5–5.1)
Sodium: 141 mEq/L (ref 136–145)
Total Bilirubin: 0.32 mg/dL (ref 0.20–1.20)
Total Protein: 7.6 g/dL (ref 6.4–8.3)

## 2013-11-04 LAB — VITAMIN B12: Vitamin B-12: 631 pg/mL (ref 211–911)

## 2013-11-04 NOTE — Telephone Encounter (Signed)
pt cld to chge lab-though appt next day-adv it was next Monday 10/26-pt decided to keep appts

## 2013-11-06 ENCOUNTER — Other Ambulatory Visit: Payer: Self-pay

## 2013-11-06 DIAGNOSIS — Z1231 Encounter for screening mammogram for malignant neoplasm of breast: Secondary | ICD-10-CM

## 2013-11-08 ENCOUNTER — Inpatient Hospital Stay: Admission: RE | Admit: 2013-11-08 | Payer: Self-pay | Source: Ambulatory Visit

## 2013-11-11 ENCOUNTER — Encounter: Payer: Self-pay | Admitting: Nurse Practitioner

## 2013-11-11 ENCOUNTER — Ambulatory Visit (HOSPITAL_BASED_OUTPATIENT_CLINIC_OR_DEPARTMENT_OTHER): Payer: Self-pay | Admitting: Nurse Practitioner

## 2013-11-11 VITALS — BP 142/88 | HR 74 | Temp 98.5°F | Resp 18 | Ht 67.0 in | Wt 131.6 lb

## 2013-11-11 DIAGNOSIS — F1910 Other psychoactive substance abuse, uncomplicated: Secondary | ICD-10-CM

## 2013-11-11 DIAGNOSIS — C50412 Malignant neoplasm of upper-outer quadrant of left female breast: Secondary | ICD-10-CM

## 2013-11-11 DIAGNOSIS — Z720 Tobacco use: Secondary | ICD-10-CM

## 2013-11-11 DIAGNOSIS — F1099 Alcohol use, unspecified with unspecified alcohol-induced disorder: Secondary | ICD-10-CM

## 2013-11-11 DIAGNOSIS — E538 Deficiency of other specified B group vitamins: Secondary | ICD-10-CM

## 2013-11-11 DIAGNOSIS — Z853 Personal history of malignant neoplasm of breast: Secondary | ICD-10-CM

## 2013-11-11 NOTE — Progress Notes (Signed)
ID: Sabrina Holland   DOB: Jun 06, 1970  MR#: [id number]  CSN#:[id number]  PCP: Pcp Not In System GYN: SU: Star Age OTHER MD: Tyler Pita  CHIEF COMPLAINT: left breast cancer CURRENT TREATMENT: none, observation  BREAST CANCER HISTORY: The patient presented to Urgent Care at [facility] with a complaint of pain.  She had palpated a lump in her left breast and she was set up for mammography at the [facility] September 13, 2006.  The mammograms found an ill-defined mass in the upper left breast associated with calcifications.  The right breast was negative.  The left breast ultrasound found the mass to be approximately 2.6 cm, irregular and hypoechoic.  There were no obvious enlarged lymph nodes.  Biopsy was performed on September 19, 2006 and this showed ([id number]) and ([id number]) an invasive ductal carcinoma that was 96% ER positive, 10% PR positive, 0 for HercepTest with the proliferation marker elevated at 25%.  With this information the patient was referred to Dr. Marlou Starks and on September 7 bilateral breast MRIs were obtained.  This showed in the upper inner quadrant of the left breast a 2.5 cm enhancing mass and then more medial and inferior to that a 7 mm well-circumscribed nodule felt to likely represent an intramammary node.  In the right breast, there was a 7 mm nodule felt possibly to represent a small intramammary lymph node.  Accordingly, the patient had again bilateral breast ultrasounds on September 11.  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  The left breast axillary mass was also located, but the patient stated that she had decided she was going to have a left mastectomy and therefore this was not biopsied.  On October 16, 2006, the patient underwent left mastectomy with sentinel lymph node biopsy, both sentinel lymph nodes were negative.  There was also an intramammary lymph node, which was negative.  The breast  mass itself measured 2.7 cm.  It was a grade 3 invasive ductal carcinoma with evidence of lymphovascular invasion, but negative margins. Her subsequent history is as detailed below  INTERVAL HISTORY: Celes returns today for follow up of her breast cancer. The interval history is unremarkable. She continues to work at [employer] on [employer] daily. She has some trouble lifting heavy objects as well as with sweeping and mopping because of pain and weakness to her left side. She continues to drink 1-2 40 oz of beer regularly, typically on weekends. She smokes around 4 cigarettes daily. She never established herself with a PCP, and has taken herself off of the amlodipine that she had been taking last year. Her blood pressure is 142/88 during this visit.   REVIEW OF SYSTEMS: [name] denies fevers, chills, nausea, vomiting, or changes in bowel or bladder habits. She denies shortness of breath, chest pain, cough, palpitations, or fatigue. She has no unexplained weight loss, headaches, or dizziness. A detailed review of systems is otherwise noncontributory.   PAST MEDICAL HISTORY: Past Medical History  Diagnosis Date  . Breast cancer   . TIA (transient ischemic attack) 11/25/2011    "first time" (11/25/2011)  status post tubal ligation, status post tonsillectomy and adenoidectomy and history of atypical chest pain evaluated in the emergency room April 18, 2006 by Dr. Roderic Palau.  The patient had an EKG showing normal sinus rhythm and a negative chest x-ray at that time.  Her troponin was less than 0.05.  PAST SURGICAL HISTORY: Past Surgical History  Procedure Laterality Date  . Tonsillectomy and adenoidectomy      "when I was little" (11/25/2011)  . Mastectomy  09/2005    left  . Breast biopsy  2007    left  . Tubal ligation  1993    FAMILY HISTORY Family History  Problem Relation Age of Onset  . Heart disease Mother   . Sickle cell anemia Cousin     maternal cousin  The patient's  parents are  alive  She has two brothers and one sister.  There is no history of breast or ovarian cancer in the family to her knowledge.  GYNECOLOGIC HISTORY: She is GX, P4, first pregnancy age 85. Was not menstruating on tamoxifen, resumed some menstruation on anastrozole, again stopped during tamoxifen but resumed when tamoxifen was stopped November of 2013   SOCIAL HISTORY: She works at [employer] at [employer].  She has Medicaid, however.  She is single; at home there is her [place] who works at [employer] and a son who is 61 who works in Ambulance person. The patient's daughters have moved out of the house. One of them is studying early childhood development at [employer].   ADVANCED DIRECTIVES: Not in place  HEALTH MAINTENANCE: History  Substance Use Topics  . Smoking status: Current Every Day Smoker -- 0.50 packs/day for 20 years    Types: Cigarettes  . Smokeless tobacco: Never Used  . Alcohol Use: 7.2 oz/week    12 Cans of beer per week     Comment: 11/25/2011 "40oz 4 times/wk"     Colonoscopy:  PAP:  Bone density:  Lipid panel:  Allergies  Allergen Reactions  . Penicillins Other (See Comments)    "I don't know; childhood allergy" (11/25/2011)    Current Outpatient Prescriptions  Medication Sig Dispense Refill  . Multiple Vitamin (MULITIVITAMIN WITH MINERALS) TABS Take 1 tablet by mouth daily.      Marland Kitchen HYDROcodone-acetaminophen (NORCO/VICODIN) 5-325 MG per tablet Take 1 tablet by mouth every 4 (four) hours as needed.  10 tablet  0   No current facility-administered medications for this visit.    OBJECTIVE: Middle-aged [place] American woman who appears stated age 43 Vitals:   11/11/13 1359  BP: 142/88  Pulse: 74  Temp: 98.5 F (36.9 C)  Resp: 18     Body mass index is 20.61 kg/(m^2).    ECOG FS: 1 Filed Weights   11/11/13 1359  Weight: 131 lb 9.6 oz (59.693 kg)   Skin: warm, dry  HEENT: sclerae anicteric, conjunctivae pink, oropharynx clear. No thrush or  mucositis.  Lymph Nodes: No cervical or supraclavicular lymphadenopathy  Lungs: clear to auscultation bilaterally, no rales, wheezes, or rhonci  Heart: regular rate and rhythm  Abdomen: round, soft, non tender, positive bowel sounds  Musculoskeletal: No focal spinal tenderness, no peripheral edema  Neuro: non focal, well oriented, positive affect  Breasts: left breast status post mastectomy. No evidence of local recurrence. Left axilla benign. Right breast unremarkable.   LAB RESULTS: Lab Results  Component Value Date   WBC 7.3 11/04/2013   NEUTROABS 3.4 11/04/2013   HGB 12.6 11/04/2013   HCT 37.8 11/04/2013   MCV 84.9 11/04/2013   PLT 346 11/04/2013      Chemistry      Component Value Date/Time   NA 141 11/04/2013 1423   NA 138 05/13/2013 0945   K 3.6 11/04/2013 1423   K 4.3 05/13/2013 0945   CL 102 05/13/2013 0945   CL 106  06/25/2012 1031   CO2 26 11/04/2013 1423   CO2 23 05/13/2013 0935   BUN 16.5 11/04/2013 1423   BUN 11 05/13/2013 0945   CREATININE 1.2* 11/04/2013 1423   CREATININE 1.00 05/13/2013 0945      Component Value Date/Time   CALCIUM 9.9 11/04/2013 1423   CALCIUM 9.0 05/13/2013 0935   ALKPHOS 87 11/04/2013 1423   ALKPHOS 105 05/13/2013 0935   AST 88* 11/04/2013 1423   AST 87* 05/13/2013 0935   ALT 79* 11/04/2013 1423   ALT 53* 05/13/2013 0935   BILITOT 0.32 11/04/2013 1423   BILITOT 0.3 05/13/2013 0935       Lab Results  Component Value Date   LABCA2 12 07/18/2011    STUDIES: No results found.  ASSESSMENT: 43 y.o.  woman status post left mastectomy and sentinel lymph node dissection September 2008 for a T2 N0, grade 3, strongly estrogen receptor positive, moderately progesterone receptor positive, HER2 negative invasive ductal carcinoma   (0) Oncotype recurrence score of 32, predicting a 25% risk of recurrence within 10 years if all she did was take tamoxifen for 5 years.   (1) status-post adjuvant chemotherapy consisting of 4 dose-dense cycles  of doxorubicin and cyclophosphamide.  (2)  on tamoxifen between January 2009 and January 2013, when she was switched to letrozole, stopped due to side effects. Tamoxifen resumed 07/18/2011, then discontinued in November 2013 after an apparent TIA.  Aromatase inhibitors were not resumed  (3) B-12 deficiency   PLAN:  Deairra and I spent about 30 minutes discussing health maintenance topics. The labs were reviewed in detail and continue to show elevated AST and ALT levels, in addition her creatinine is elevated at 1.2. I encouraged her to cut her drinking down, but she states that in her line of work "you're gonna need to something to take the edge off." She is also uninterested in quitting smoking at this time. When I enquired about her PCP pursuits, she states that she quit looking because she is uninsured and can't afford one. She does not qualify for medicaid because she makes too much money. She is overdue for her mammogram this year, but was agreeable to setting this appointment up, so I wrote orders for this procedure. I also wrote her a letter with lifting restrictions to under 10lb and to remove sweeping or mopping from her duties at work.  Dailynn will return next year for labs and an office visit. She understands and agrees with this plan. She knows the goal of treatment in her case is cure. She has been encouraged to call with any issues that might arise before her next visit here.   Marcelino Duster    11/11/2013

## 2013-11-12 ENCOUNTER — Telehealth: Payer: Self-pay | Admitting: Oncology

## 2013-11-12 NOTE — Telephone Encounter (Signed)
, °

## 2013-11-18 ENCOUNTER — Encounter: Payer: Self-pay | Admitting: Nurse Practitioner

## 2013-11-29 ENCOUNTER — Other Ambulatory Visit: Payer: Self-pay | Admitting: *Deleted

## 2013-12-02 ENCOUNTER — Telehealth: Payer: Self-pay | Admitting: Oncology

## 2013-12-02 NOTE — Telephone Encounter (Signed)
, °

## 2013-12-03 ENCOUNTER — Ambulatory Visit: Payer: Self-pay

## 2013-12-06 ENCOUNTER — Ambulatory Visit (HOSPITAL_BASED_OUTPATIENT_CLINIC_OR_DEPARTMENT_OTHER): Payer: Self-pay

## 2013-12-06 DIAGNOSIS — E538 Deficiency of other specified B group vitamins: Secondary | ICD-10-CM

## 2013-12-06 DIAGNOSIS — C50912 Malignant neoplasm of unspecified site of left female breast: Secondary | ICD-10-CM

## 2013-12-06 DIAGNOSIS — Z23 Encounter for immunization: Secondary | ICD-10-CM

## 2013-12-06 MED ORDER — INFLUENZA VAC SPLIT QUAD 0.5 ML IM SUSY
0.5000 mL | PREFILLED_SYRINGE | Freq: Once | INTRAMUSCULAR | Status: AC
  Administered 2013-12-06: 0.5 mL via INTRAMUSCULAR
  Filled 2013-12-06: qty 0.5

## 2013-12-06 NOTE — Patient Instructions (Signed)

## 2014-05-15 ENCOUNTER — Encounter: Payer: Self-pay | Admitting: *Deleted

## 2014-05-15 NOTE — Progress Notes (Signed)
Pt came in to the office requesting a note from MD for work stating she cannot lift more then 5 lbs.  Sabrina Holland also asked if " can someone look at my incision ?"  " when I am at work doing stuff I feel like bubbles under the incision"  This RN obtained note and assessed left mastectomy site.  Area is clean dry and intact.  Incision has a slightly darkened color but no swelling or redness noted.  Per palpating- no lumps or areas of abnormality noted.  This RN discussed above with pt with possible explanation that due to work activities she is pulling on area that may have scar tissue which is being pulled.  Per request of pt and above - letter given stating she cannot lift more then 5 lbs which may allow her not to participate in more physical activities that may aggravate surgical site.  Sabrina Holland verbally states she will call if above concerns continue.

## 2014-07-28 ENCOUNTER — Telehealth: Payer: Self-pay | Admitting: *Deleted

## 2014-07-28 ENCOUNTER — Encounter: Payer: Self-pay | Admitting: Oncology

## 2014-07-28 NOTE — Telephone Encounter (Signed)
Voicemail left for collaborative nurse to fax letter to employer as requested by patient and mail copy to [place] for pt. To have a copy on hand for her records.

## 2014-07-28 NOTE — Telephone Encounter (Signed)
Please let the opt know the letter is available at my nurse's desk or we can mailit to her  [facility]

## 2014-07-28 NOTE — Telephone Encounter (Addendum)
Patient called requesting letter for work stating "she cannot sweep or mop.  I have pain every stroke trying to since my surgery and they don't understand.    Has turned in the letter about lifting and this needs to be included again.  They're giving me a hard time at work and I need my job.  Fax number is [phone] attn Foye Deer."  Return number for Jonelle Sidle is [phone].

## 2014-07-28 NOTE — Telephone Encounter (Signed)
This RN faxed letter to employer, Foye Deer. Fax # is [phone]. Also, mailed letter to patient so, she would have a copy. Verification of faxed letter.

## 2014-09-18 ENCOUNTER — Emergency Department (INDEPENDENT_AMBULATORY_CARE_PROVIDER_SITE_OTHER)
Admission: EM | Admit: 2014-09-18 | Discharge: 2014-09-18 | Disposition: A | Discharge status: Home / Self Care | Payer: Self-pay | Source: Home / Self Care | Attending: Family Medicine | Admitting: Family Medicine

## 2014-09-18 ENCOUNTER — Telehealth: Payer: Self-pay | Admitting: *Deleted

## 2014-09-18 ENCOUNTER — Encounter (HOSPITAL_COMMUNITY): Payer: Self-pay | Admitting: Emergency Medicine

## 2014-09-18 DIAGNOSIS — R0789 Other chest pain: Secondary | ICD-10-CM

## 2014-09-18 MED ORDER — DICLOFENAC POTASSIUM 50 MG PO TABS: 50.0000 mg | ORAL_TABLET | Freq: Three times a day (TID) | ORAL | Status: DC

## 2014-09-18 NOTE — Telephone Encounter (Signed)
FOR TWO WEEKS PT. HAS HAD A CONSTANT "BUBBLING FEELING" LEFT SIDE OF CHEST AT THE MASTECTOMY SITE. SHE STATES "IT FEELS LIKE FLUID IN THERE". PT IS HAVING INTERMITTED PAIN AT A SCALE OF SIX BUT HAS NOT TAKEN ANYTHING FOR PAIN. SHE DOES NOT HAVE A COUGH AND DOES NOT THINK SHE HAS A FEVER. VERBAL ORDER AND READ BACK TO DR.MAGRINAT- PT. MAY GO TO URGENT CARE OR HAVE AN ULTRASOUND DONE. NOTIFIED PT. OF THE ABOVE INSTRUCTION. SHE WILL GO TO THE URGENT CARE AND THEN CALL THIS OFFICE WAS AN UPDATE AFTER HER VISIT.

## 2014-09-18 NOTE — ED Provider Notes (Signed)
CSN: [id number]     Arrival date & time 09/18/14  1435 History   First MD Initiated Contact with Patient 09/18/14 1505     Chief Complaint  Patient presents with  . Breast Pain   (Consider location/radiation/quality/duration/timing/severity/associated sxs/prior Treatment) Patient is a 44 y.o. female presenting with chest pain. The history is provided by the patient.  Chest Pain Pain location:  L lateral chest Pain quality: sharp   Pain radiates to:  Does not radiate Pain radiates to the back: no   Pain severity:  Moderate Onset quality:  Sudden Progression:  Unchanged Chronicity:  New Context: movement   Relieved by:  None tried Worsened by:  Nothing tried Ineffective treatments:  None tried Associated symptoms: no cough, no diaphoresis, no fever, no palpitations and no shortness of breath   Risk factors comment:  S/p left mastectomy in 2008 with chemo.   Past Medical History  Diagnosis Date  . Breast cancer   . TIA (transient ischemic attack) 11/25/2011    "first time" (11/25/2011)   Past Surgical History  Procedure Laterality Date  . Tonsillectomy and adenoidectomy      "when I was little" (11/25/2011)  . Mastectomy  09/2005    left  . Breast biopsy  2007    left  . Tubal ligation  1993   Family History  Problem Relation Age of Onset  . Heart disease Mother   . Sickle cell anemia Cousin     maternal cousin   Social History  Substance Use Topics  . Smoking status: Current Every Day Smoker -- 0.50 packs/day for 20 years    Types: Cigarettes  . Smokeless tobacco: Never Used  . Alcohol Use: 7.2 oz/week    12 Cans of beer per week     Comment: 11/25/2011 "40oz 4 times/wk"   OB History    Gravida Para Term Preterm AB TAB SAB Ectopic Multiple Living   5 4 4  1  1   4      Review of Systems  Constitutional: Negative.  Negative for fever and diaphoresis.  Respiratory: Negative for cough and shortness of breath.   Cardiovascular: Positive for chest pain. Negative for  palpitations and leg swelling.    Allergies  Penicillins  Home Medications   Prior to Admission medications   Medication Sig Start Date End Date Taking? Authorizing Provider  diclofenac (CATAFLAM) 50 MG tablet Take 1 tablet (50 mg total) by mouth 3 (three) times daily. 09/18/14   Billy Fischer, MD  HYDROcodone-acetaminophen (NORCO/VICODIN) 5-325 MG per tablet Take 1 tablet by mouth every 4 (four) hours as needed. 05/13/13   Serita Grit, MD  Multiple Vitamin (MULITIVITAMIN WITH MINERALS) TABS Take 1 tablet by mouth daily.    Historical Provider, MD   Meds Ordered and Administered this Visit  Medications - No data to display  BP 153/94 mmHg  Pulse 103  Temp(Src) 97.9 F (36.6 C) (Oral)  Resp 16  SpO2 98% No data found.   Physical Exam  Constitutional: She is oriented to person, place, and time. She appears well-developed and well-nourished. No distress.  Neck: Normal range of motion. Neck supple.  Cardiovascular: Regular rhythm, normal heart sounds and intact distal pulses.   Pulmonary/Chest: Effort normal and breath sounds normal. No respiratory distress. She has no wheezes. She has no rales. She exhibits tenderness.  No mass or erythema or skin changes. Simply local tenderness .  Neurological: She is alert and oriented to person, place, and time.  Skin:  Skin is warm and dry.  Nursing note and vitals reviewed.   ED Course  Procedures (including critical care time)  Labs Review Labs Reviewed - No data to display  Imaging Review No results found.   Visual Acuity Review  Right Eye Distance:   Left Eye Distance:   Bilateral Distance:    Right Eye Near:   Left Eye Near:    Bilateral Near:         MDM   1. Left-sided chest wall pain        Billy Fischer, MD 09/18/14 [phone]

## 2014-09-18 NOTE — Telephone Encounter (Signed)
PT.'S MASTECTOMY WAS 2008. LEFT MESSAGE ON PT.'S VOICE MAIL TO RETURN A CALL WITH MORE INFORMATION.

## 2014-09-18 NOTE — ED Notes (Signed)
Here for left breast pain States she had a mastectomy in 2008 on left breast States the feeling is more like bubbles

## 2014-10-14 ENCOUNTER — Telehealth: Payer: Self-pay | Admitting: Oncology

## 2014-10-14 NOTE — Telephone Encounter (Signed)
Mailed called with change appointment per MD due to Call day. Moved from 10/27 to 10/28

## 2014-10-15 ENCOUNTER — Emergency Department (HOSPITAL_COMMUNITY)
Admission: EM | Admit: 2014-10-15 | Discharge: 2014-10-15 | Disposition: A | Discharge status: Home / Self Care | Payer: Self-pay | Attending: Emergency Medicine | Admitting: Emergency Medicine

## 2014-10-15 ENCOUNTER — Encounter (HOSPITAL_COMMUNITY): Payer: Self-pay | Admitting: *Deleted

## 2014-10-15 DIAGNOSIS — I10 Essential (primary) hypertension: Secondary | ICD-10-CM | POA: Insufficient documentation

## 2014-10-15 DIAGNOSIS — Z791 Long term (current) use of non-steroidal anti-inflammatories (NSAID): Secondary | ICD-10-CM | POA: Insufficient documentation

## 2014-10-15 DIAGNOSIS — Z720 Tobacco use: Secondary | ICD-10-CM | POA: Insufficient documentation

## 2014-10-15 DIAGNOSIS — H1033 Unspecified acute conjunctivitis, bilateral: Secondary | ICD-10-CM | POA: Insufficient documentation

## 2014-10-15 DIAGNOSIS — Z853 Personal history of malignant neoplasm of breast: Secondary | ICD-10-CM | POA: Insufficient documentation

## 2014-10-15 DIAGNOSIS — Z8673 Personal history of transient ischemic attack (TIA), and cerebral infarction without residual deficits: Secondary | ICD-10-CM | POA: Insufficient documentation

## 2014-10-15 DIAGNOSIS — Z880 Allergy status to penicillin: Secondary | ICD-10-CM | POA: Insufficient documentation

## 2014-10-15 MED ORDER — ERYTHROMYCIN 5 MG/GM OP OINT
1.0000 "application " | TOPICAL_OINTMENT | Freq: Four times a day (QID) | OPHTHALMIC | Status: DC
  Administered 2014-10-15: 1 via OPHTHALMIC
  Filled 2014-10-15: qty 3.5

## 2014-10-15 MED ORDER — ERYTHROMYCIN 5 MG/GM OP OINT: 1.0000 "application " | TOPICAL_OINTMENT | Freq: Four times a day (QID) | OPHTHALMIC | Status: DC

## 2014-10-15 MED ORDER — FLUORESCEIN SODIUM 1 MG OP STRP
2.0000 | ORAL_STRIP | Freq: Once | OPHTHALMIC | Status: AC
  Administered 2014-10-15: 2 via OPHTHALMIC
  Filled 2014-10-15: qty 2

## 2014-10-15 MED ORDER — TETRACAINE HCL 0.5 % OP SOLN
2.0000 [drp] | Freq: Once | OPHTHALMIC | Status: AC
  Administered 2014-10-15: 2 [drp] via OPHTHALMIC
  Filled 2014-10-15: qty 2

## 2014-10-15 NOTE — ED Notes (Signed)
Pt is in stable condition upon d/c and ambulates from ED. 

## 2014-10-15 NOTE — Discharge Instructions (Signed)
1. Medications: Erythromycin, usual home medications 2. Treatment: rest, drink plenty of fluids, wash hands frequently 3. Follow Up: Please follow-up with ophthalmology within 48 hours if no improvement in your symptoms. Please [facility] for further discussion of your high blood pressure.  Please return to the ER for worsening symptoms, vision changes, high fevers or other concerns   Bacterial Conjunctivitis Bacterial conjunctivitis (commonly called pink eye) is redness, soreness, or puffiness (inflammation) of the white part of your eye. It is caused by a germ called bacteria. These germs can easily spread from person to person (contagious). Your eye often will become red or pink. Your eye may also become irritated, watery, or have a thick discharge.  HOME CARE   Apply a cool, clean washcloth over closed eyelids. Do this for 10-20 minutes, 3-4 times a day while you have pain.  Gently wipe away any fluid coming from the eye with a warm, wet washcloth or cotton ball.  Wash your hands often with soap and water. Use paper towels to dry your hands.  Do not share towels or washcloths.  Change or wash your pillowcase every day.  Do not use eye makeup until the infection is gone.  Do not use machines or drive if your vision is blurry.  Stop using contact lenses. Do not use them again until your doctor says it is okay.  Do not touch the tip of the eye drop bottle or medicine tube with your fingers when you put medicine on the eye. GET HELP RIGHT AWAY IF:   Your eye is not better after 3 days of starting your medicine.  You have a yellowish fluid coming out of the eye.  You have more pain in the eye.  Your eye redness is spreading.  Your vision becomes blurry.  You have a fever or lasting symptoms for more than 2-3 days.  You have a fever and your symptoms suddenly get worse.  You have pain in the face.  Your face gets red or puffy  (swollen). MAKE SURE YOU:   Understand these instructions.  Will watch this condition.  Will get help right away if you are not doing well or get worse. Document Released: 10/13/2007 Document Revised: 12/21/2011 Document Reviewed: 09/09/2011 [facility] Patient Information 2015 [place], [place]. This information is not intended to replace advice given to you by your health care provider. Make sure you discuss any questions you have with your health care provider.    Emergency Department Resource Guide 1) Find a Doctor and Pay Out of Pocket Although you won't have to find out who is covered by your insurance plan, it is a good idea to ask around and get recommendations. You will then need to call the office and see if the doctor you have chosen will accept you as a new patient and what types of options they offer for patients who are self-pay. Some doctors offer discounts or will set up payment plans for their patients who do not have insurance, but you will need to ask so you aren't surprised when you get to your appointment.  2) Contact Your Local Health Department Not all health departments have doctors that can see patients for sick visits, but many do, so it is worth a call to see if yours does. If you don't know where your local health department is, you can check in your phone book. The CDC also has a tool to help you locate your state's health  department, and many state websites also have listings of all of their local health departments.  3) Find a [facility] If your illness is not likely to be very severe or complicated, you may want to try a walk in clinic. These are popping up all over the country in pharmacies, drugstores, and shopping centers. They're usually staffed by nurse practitioners or physician assistants that have been trained to treat common illnesses and complaints. They're usually fairly quick and inexpensive. However, if you have serious medical issues or chronic  medical problems, these are probably not your best option.  No Primary Care Doctor: - Call Health Connect at  [phone] - they can help you locate a primary care doctor that  accepts your insurance, provides certain services, etc. - Physician Referral Service- [phone]  Chronic Pain Problems: Organization         Address  Phone   Notes  [facility]  [phone] Patients need to be referred by their primary care doctor.   Medication Assistance: Organization         Address  Phone   Notes  [facility] Medication [facility] [address] [place], [place] [address] [phone] --Must be a resident of [facility] -- Must have NO insurance coverage whatsoever (no Medicaid/ Medicare, etc.) -- The pt. MUST have a primary care doctor that directs their care regularly and follows them in the community   [facility]ist  [phone]   [employer]  [phone]    Agencies that provide inexpensive medical care: Organization         Address  Phone   Notes  [place]  [phone]   [facility]    [phone]   [facility] [place], [place] [address] [phone]   [place] [address], [place] [phone]   Planned Parenthood    [phone]   [facility]    [phone]   [place] [address], [place] Phone:  [phone], Fax:  [phone] Hours of Operation:  9 am - 6 pm, M-F.  Also accepts Medicaid/Medicare and self-pay.  [facility] for [place], [address], [place] Phone: [phone], Fax: [phone]. Hours of Operation:  8:30 am - 5:30 pm, M-F.  Also accepts Medicaid and self-pay.  [facility] [place] [address], [place] Phone: [phone]   [place], [place], [place] [phone],  Ext. [phone] Mondays & Thursdays: 7-9 AM.  First 15 patients are seen on a first come, first serve basis.    [place] Providers:  Organization         Address  Phone   Notes  [facility] [address], [place] [phone] Also accepts self-pay patients.  [facility] [address], [place]  [phone]   [place], [address], [place] [phone]   [facility] [address], [place] [phone]   Lucianne Lei [address], [place]   [phone] Only accepts [place] Access [place] patients after they have their name applied to their card.   Self-Pay (no insurance) in [facility]:  Organization  Address  Phone   Notes  Sickle Cell Patients, [facility] [place] [phone]   [facility] [place] [phone]   [facility] [place]  [address], [address], [place] [phone]   [facility]/Dr. Osei-Bonsu  [address] [place] or [address], [place] [phone] Phone number for both [place] and [place] locations is the same.  [facility] [address] [place] [phone]   [facility] [address] [place] or [address] [phone]   [facility] [address], [place] [phone], phone; [phone], fax Sees patients 1st and 3rd Saturday of every month.  Must not qualify for public or private insurance (i.e. Medicaid, Medicare, [place] Health Choice, Veterans' Benefits)  Household income should be no more than 200% of the poverty level The clinic cannot treat you if you are pregnant or think you are pregnant  Sexually transmitted diseases are not  treated at the clinic.    Dental Care: Organization         Address  Phone  Notes  [facility] Department of [facility] [place] [phone] Accepts children up to age 47 who are enrolled in [place] or [place]; pregnant women with a Medicaid card; and children who have applied for Medicaid or [place] Health Choice, but were declined, whose parents can pay a reduced fee at time of service.  [facility] Department of [facility]  [address], [place] [phone] Accepts children up to age 54 who are enrolled in [place] or [place]; pregnant women with a Medicaid card; and children who have applied for Medicaid or [place] Health Choice, but were declined, whose parents can pay a reduced fee at time of service.  [place] Adult Dental Access PROGRAM  [place] [phone] Patients are seen by appointment only. Walk-ins are not accepted. [place] will see patients 66 years of age and older. Monday - Tuesday (8am-5pm) Most Wednesdays (8:30-5pm) $30 per visit, cash only  [facility] Adult Dental Access PROGRAM  [address], [facility] [phone] Patients are seen by appointment only. Walk-ins are not accepted. [place] will see patients 62 years of age and older. One Wednesday Evening (Monthly: Volunteer Based).  $30 per visit, cash only  Yoni Lobos  [phone] for adults; Children under age 72, call Graduate Pediatric Dentistry at [phone]. Children aged 60-14, please call [phone] to request a pediatric application.  Dental services are provided in all areas of dental care including fillings, crowns and bridges, complete and partial dentures, implants, gum treatment, root canals, and extractions. Preventive care is also provided. Treatment is provided to both adults and children. Patients are selected via a lottery and there is  often a waiting list.   [facility] [address], [place]  [phone] [email or web address]   [facility] [address] [place], [place] [phone], Ext. [phone] Second and Fourth Thursday of each month, opens at 6:30 AM; Clinic ends at 9 AM.  Patients are seen on a first-come first-served basis, and a limited number are seen during each clinic.   [facility]  [address] [place], [place]  [place], [place] [phone]   Eligibility Requirements You must have lived in [place], [place], or [place] counties for at least the last three months.   You cannot be eligible for state or federal sponsored [employer], including [employer], [place], or [employer].   You generally cannot be eligible for healthcare insurance through your employer.    How to apply: Eligibility screenings are held every Tuesday and Wednesday afternoon from 1:00 pm until 4:00 pm. You do not need an appointment for the interview!  [facility] [address], [place], [place]   [place]  [place] Department  [place]  [phone]    Behavioral Health Resources in the Community: Intensive Outpatient Programs Organization         Address  Phone  Notes  [place]. [address] [place], [place] [phone]   [facility] [address] [place], [place]   ADS: [facility] [address] [place], [place]   [place] [address]  [place], [place] or [phone]   Substance Abuse Resources Organization         Address  Phone  Notes  Alcohol and Drug Services  ([phone]   [place]  [phone]   [facility] [place]   Chinita Pester  [phone]   Residential & Outpatient Substance  Abuse Program  [phone]   Psychological Services Organization         Address  Phone  Notes  [facility]  [place]  [phone]   [place] [address], [place] or [phone]    Mobile Crisis Teams Organization         Address  Phone  Notes  [facility], Mobile Crisis Care Unit  [phone]   [facility]  [address] [place], [place]   [place] [address] [place] [phone]    Self-Help/Support Groups Organization         Address  Phone             Notes  [place]. of [place] - variety of support groups  [place] Call for more information  Narcotics Anonymous (NA), [facility] [address], [employer] [place]  2 meetings at this location   Special educational needs teacher         Address  Phone  Notes  ASAP Residential Treatment [place],    [place]  [phone]   [facility]  [address], [place] [phone], [place], [place]   [place], [place] [phone] Admissions: 8am-3pm M-F  Incentives Substance [place] [address],    [place], [place] [phone]   [facility] [address] Jadene Pierini [place], [place]   [facility] [address]  [place], [place]   [facility] Programs - Intensive Outpatient [address], [place], [place]   [facility] [place].,  [place], [place] or [phone]   [facility] (RTS) [address], [place], [place] Accepts Medicaid  [facility] [address],  [place] [phone] Substance Abuse/Addiction Treatment   [facility] Resources Organization  Address  Phone  Notes  °[facility]  [phone]) [phone]   °Julie Brannon, PhD [address] [place], [place]   ([phone]) [phone] or (336) [phone]   °[facility]   [address] °[place], [place] ([phone]) [phone]   °[facility] [address], [place], [place] ([phone]) [phone] Insurance/Medicaid/sponsorship through [facility]  °[facility] [address]                                    [place], [place] [phone] Therapy/tele-psych/case  °[facility] [address]  ° [place], [place] [phone]    °Dr. Arfeen  ([phone]   °[facility] [place]  [employer] [place] [facility] 1) [address], [place] °2) [address], [place] °3)  [address], [place] ([phone]) [phone] °([phone]) [phone] ° °([phone]) [phone]   °[place] Child Abuse Hotline (336) 342-1394 or (336) [phone] (After Hours)    ° ° ° °

## 2014-10-15 NOTE — ED Provider Notes (Signed)
CSN: [id number]     Arrival date & time 10/15/14  0911 History  By signing my name below, I, Evelene Croon, attest that this documentation has been prepared under the direction and in the presence of non-physician practitioner, Abigail Butts, PA-C. Electronically Signed: Evelene Croon, Scribe. 10/15/2014. 11:08 AM.   Chief Complaint  Patient presents with  . Eye Problem   The history is provided by the patient. No language interpreter was used.     HPI Comments:  Sabrina Holland is a 44 y.o. female who presents to the Emergency Department complaining of itching and redness to her bilateral eyes since this AM. Pt states when she woke up this AM her eyes were matted and she has continued to have thick drainage. She also notes a film over her eyes that resolves with blinking. She denies fever, chills, congestion, cough, and  sore throat. She also denies sick contacts. Pt is supposed to wear glasses but does not and has not worn them in years.  Pt with a Hx of HTN, but does not have a PCP or take medication due to lack of insurance.    She is currently in remission of breast CA.  Past Medical History  Diagnosis Date  . Breast cancer   . TIA (transient ischemic attack) 11/25/2011    "first time" (11/25/2011)   Past Surgical History  Procedure Laterality Date  . Tonsillectomy and adenoidectomy      "when I was little" (11/25/2011)  . Mastectomy  09/2005    left  . Breast biopsy  2007    left  . Tubal ligation  1993   Family History  Problem Relation Age of Onset  . Heart disease Mother   . Sickle cell anemia Cousin     maternal cousin   Social History  Substance Use Topics  . Smoking status: Current Every Day Smoker -- 0.50 packs/day for 20 years    Types: Cigarettes  . Smokeless tobacco: Never Used  . Alcohol Use: 7.2 oz/week    12 Cans of beer per week     Comment: 11/25/2011 "40oz 4 times/wk"   OB History    Gravida Para Term Preterm AB TAB SAB Ectopic Multiple Living    5 4 4  1  1   4      Review of Systems  Constitutional: Negative for fever and chills.  HENT: Negative for congestion and sore throat.   Eyes: Positive for discharge, redness and itching.  Respiratory: Negative for cough.   Gastrointestinal: Negative for abdominal pain.  Endocrine: Negative for polyuria.  Skin: Negative for rash.  Allergic/Immunologic: Negative for immunocompromised state.  Neurological: Negative for headaches.    Allergies  Penicillins  Home Medications   Prior to Admission medications   Medication Sig Start Date End Date Taking? Authorizing Sokhna Christoph  diclofenac (CATAFLAM) 50 MG tablet Take 1 tablet (50 mg total) by mouth 3 (three) times daily. 09/18/14   Billy Fischer, MD  HYDROcodone-acetaminophen (NORCO/VICODIN) 5-325 MG per tablet Take 1 tablet by mouth every 4 (four) hours as needed. 05/13/13   Serita Grit, MD  Multiple Vitamin (MULITIVITAMIN WITH MINERALS) TABS Take 1 tablet by mouth daily.    Historical Ronda Rajkumar, MD   BP 160/108 mmHg  Pulse 95  Temp(Src) 97.8 F (36.6 C) (Oral)  Resp 18  Ht 5\' 5"  (1.651 m)  Wt 135 lb (61.236 kg)  BMI 22.47 kg/m2  SpO2 98% Physical Exam  Constitutional: She is oriented to person, place, and  time. She appears well-developed and well-nourished. No distress.  HENT:  Head: Normocephalic and atraumatic.  Nose: Nose normal. No mucosal edema or rhinorrhea.  Mouth/Throat: Uvula is midline, oropharynx is clear and moist and mucous membranes are normal. No uvula swelling. No oropharyngeal exudate, posterior oropharyngeal edema, posterior oropharyngeal erythema or tonsillar abscesses.  Eyes: EOM and lids are normal. Pupils are equal, round, and reactive to light. Lids are everted and swept, no foreign bodies found. Right eye exhibits no chemosis, no discharge and no exudate. No foreign body present in the right eye. Left eye exhibits no chemosis, no discharge and no exudate. No foreign body present in the left eye. Right  conjunctiva is injected. Right conjunctiva has no hemorrhage. Left conjunctiva is injected. Left conjunctiva has no hemorrhage.  Slit lamp exam:      The right eye shows no corneal abrasion, no corneal flare, no corneal ulcer, no foreign body, no fluorescein uptake and no anterior chamber bulge.       The left eye shows no corneal abrasion, no corneal flare, no corneal ulcer, no foreign body, no fluorescein uptake and no anterior chamber bulge.  Pupils equal round and reactive to light No vertical, horizontal or rotational nystagmus No Corneal abrasion or fluorescein uptake No visible foreign body No corneal flare, ulcer or dendritic staining  No herpetic lesions to the face or around the eye Purulent drainage noted in both eyes Visual Acuity: pt uncooperative and refusing acuity exam  Neck: Normal range of motion.  Full range of motion without pain No midline or paraspinal tenderness No nuchal rigidity; no meningeal signs  Cardiovascular: Normal rate, regular rhythm and intact distal pulses.   Pulmonary/Chest: Effort normal. No respiratory distress.  Musculoskeletal: Normal range of motion.  Neurological: She is alert and oriented to person, place, and time.  Mental Status:  Alert, oriented, thought content appropriate. Speech fluent without evidence of aphasia. Able to follow 2 step commands without difficulty.   Cranial Nerves:  II:  Peripheral visual fields grossly normal, pupils equal, round, reactive to light III,IV, VI: ptosis not present, extra-ocular motions intact bilaterally  V,VII: smile symmetric, facial light touch sensation equal VIII: hearing grossly normal bilaterally  IX,X: gag reflex present  XI: bilateral shoulder shrug equal and strong XII: midline tongue extension   Skin: Skin is warm and dry. She is not diaphoretic. No erythema.  Psychiatric: She has a normal mood and affect.  Nursing note and vitals reviewed.   ED Course  Procedures   DIAGNOSTIC  STUDIES:  Oxygen Saturation is 98% on RA, normal by my interpretation.    COORDINATION OF CARE:  10:29 AM Discussed treatment plan with pt at bedside and pt agreed to plan.  Labs Review Labs Reviewed - No data to display  Imaging Review No results found. I have personally reviewed and evaluated these images and lab results as part of my medical decision-making.   EKG Interpretation None      MDM   Final diagnoses:  Acute bacterial conjunctivitis of both eyes  Essential hypertension   Sabrina Holland presents with symptoms consistent with bacterial conjunctivitis.  Purulent discharge exam.  No corneal abrasions, entrapment, consensual photophobia, or dendritic staining with fluorescein study.  Presentation non-concerning for iritis, corneal abrasions, or HSV.  No evidence of preseptal or orbital cellulitis.  Pt is not a contact lens wearer.  Patient will be given erythromycin ophthalmic.  Personal hygiene and frequent handwashing discussed.  Patient advised to followup with ophthalmologist for reevaluation  in several days. Patient verbalizes understanding and is agreeable with discharge.  Patient noted to be hypertensive in the emergency department.  No signs of hypertensive urgency.  Discussed with patient the need for close follow-up and management by their primary care physician.   [facility]      BP 160/108 mmHg  Pulse 95  Temp(Src) 97.8 F (36.6 C) (Oral)  Resp 18  Ht 5\' 5"  (1.651 m)  Wt 135 lb (61.236 kg)  BMI 22.47 kg/m2  SpO2 98%    Abigail Butts, PA-C 10/15/14 [place], MD 10/15/14 1159

## 2014-10-15 NOTE — ED Notes (Signed)
Pt reports waking up this am with redness to eyes and itching.

## 2014-10-15 NOTE — ED Notes (Signed)
Attempted to perform visual acuity, patient uncooperative and refusing to complete exam.  Jarrett Soho Muthersbaugh PA and Loura Halt RN notified.

## 2014-10-15 NOTE — ED Notes (Signed)
PA aware pt is htn.

## 2014-10-17 ENCOUNTER — Encounter (HOSPITAL_COMMUNITY): Payer: Self-pay | Admitting: Emergency Medicine

## 2014-10-17 ENCOUNTER — Emergency Department (HOSPITAL_COMMUNITY)
Admission: EM | Admit: 2014-10-17 | Discharge: 2014-10-17 | Disposition: A | Discharge status: Home / Self Care | Payer: Self-pay | Attending: Emergency Medicine | Admitting: Emergency Medicine

## 2014-10-17 ENCOUNTER — Emergency Department (HOSPITAL_COMMUNITY): Payer: Self-pay

## 2014-10-17 DIAGNOSIS — Y9389 Activity, other specified: Secondary | ICD-10-CM | POA: Insufficient documentation

## 2014-10-17 DIAGNOSIS — Z880 Allergy status to penicillin: Secondary | ICD-10-CM | POA: Insufficient documentation

## 2014-10-17 DIAGNOSIS — Z791 Long term (current) use of non-steroidal anti-inflammatories (NSAID): Secondary | ICD-10-CM | POA: Insufficient documentation

## 2014-10-17 DIAGNOSIS — Z720 Tobacco use: Secondary | ICD-10-CM | POA: Insufficient documentation

## 2014-10-17 DIAGNOSIS — Z853 Personal history of malignant neoplasm of breast: Secondary | ICD-10-CM | POA: Insufficient documentation

## 2014-10-17 DIAGNOSIS — S8992XA Unspecified injury of left lower leg, initial encounter: Secondary | ICD-10-CM | POA: Insufficient documentation

## 2014-10-17 DIAGNOSIS — W19XXXA Unspecified fall, initial encounter: Secondary | ICD-10-CM

## 2014-10-17 DIAGNOSIS — W010XXA Fall on same level from slipping, tripping and stumbling without subsequent striking against object, initial encounter: Secondary | ICD-10-CM | POA: Insufficient documentation

## 2014-10-17 DIAGNOSIS — Z8673 Personal history of transient ischemic attack (TIA), and cerebral infarction without residual deficits: Secondary | ICD-10-CM | POA: Insufficient documentation

## 2014-10-17 DIAGNOSIS — Y9289 Other specified places as the place of occurrence of the external cause: Secondary | ICD-10-CM | POA: Insufficient documentation

## 2014-10-17 DIAGNOSIS — Y998 Other external cause status: Secondary | ICD-10-CM | POA: Insufficient documentation

## 2014-10-17 DIAGNOSIS — M25562 Pain in left knee: Secondary | ICD-10-CM

## 2014-10-17 MED ORDER — TRAMADOL-ACETAMINOPHEN 37.5-325 MG PO TABS: 1.0000 | ORAL_TABLET | Freq: Four times a day (QID) | ORAL | Status: DC | PRN

## 2014-10-17 MED ORDER — OXYCODONE-ACETAMINOPHEN 5-325 MG PO TABS
1.0000 | ORAL_TABLET | Freq: Once | ORAL | Status: AC
  Administered 2014-10-17: 1 via ORAL
  Filled 2014-10-17: qty 1

## 2014-10-17 NOTE — ED Provider Notes (Signed)
CSN: [id number]     Arrival date & time 10/17/14  1321 History  By signing my name below, I, Eustaquio Maize, attest that this documentation has been prepared under the direction and in the presence of Quincy Carnes, PA-C. Electronically Signed: Eustaquio Maize, ED Scribe. 10/17/2014. 1:39 PM.  Chief Complaint  Patient presents with  . Leg Pain  . Fall   The history is provided by the patient. No language interpreter was used.     HPI Comments: Sabrina Holland is a 44 y.o. female brought in by ambulance, who presents to the Emergency Department complaining of sudden onset, constant, severe, left knee pain s/p ground level fall that occurred earlier today. Pt states that she slipped in a puddle, causing her to fall. No head injury or LOC. She is unable to bear weight onto the leg. No previous injury or surgery to left knee. Denies weakness, numbness, tingling, or any other associated symptoms.    Past Medical History  Diagnosis Date  . Breast cancer   . TIA (transient ischemic attack) 11/25/2011    "first time" (11/25/2011)   Past Surgical History  Procedure Laterality Date  . Tonsillectomy and adenoidectomy      "when I was little" (11/25/2011)  . Mastectomy  09/2005    left  . Breast biopsy  2007    left  . Tubal ligation  1993   Family History  Problem Relation Age of Onset  . Heart disease Mother   . Sickle cell anemia Cousin     maternal cousin   Social History  Substance Use Topics  . Smoking status: Current Every Day Smoker -- 0.50 packs/day for 20 years    Types: Cigarettes  . Smokeless tobacco: Never Used  . Alcohol Use: 7.2 oz/week    12 Cans of beer per week     Comment: 11/25/2011 "40oz 4 times/wk"   OB History    Gravida Para Term Preterm AB TAB SAB Ectopic Multiple Living   5 4 4  1  1   4      Review of Systems  Musculoskeletal: Positive for arthralgias (Left knee pain).  Skin: Negative for wound.  Neurological: Negative for syncope, weakness and numbness.   All other systems reviewed and are negative.  Allergies  Penicillins  Home Medications   Prior to Admission medications   Medication Sig Start Date End Date Taking? Authorizing Provider  diclofenac (CATAFLAM) 50 MG tablet Take 1 tablet (50 mg total) by mouth 3 (three) times daily. 09/18/14   Billy Fischer, MD  erythromycin ophthalmic ointment Place 1 application into both eyes every 6 (six) hours. 10/15/14   Hannah Muthersbaugh, PA-C  HYDROcodone-acetaminophen (NORCO/VICODIN) 5-325 MG per tablet Take 1 tablet by mouth every 4 (four) hours as needed. 05/13/13   Serita Grit, MD  Multiple Vitamin (MULITIVITAMIN WITH MINERALS) TABS Take 1 tablet by mouth daily.    Historical Provider, MD   Triage Vitals: BP 155/95 mmHg  Pulse 85  Temp(Src) 97.9 F (36.6 C) (Oral)  Resp 16  SpO2 100%   Physical Exam  Constitutional: She is oriented to person, place, and time. She appears well-developed and well-nourished.  HENT:  Head: Normocephalic and atraumatic.  Mouth/Throat: Oropharynx is clear and moist.  Eyes: Conjunctivae and EOM are normal. Pupils are equal, round, and reactive to light.  Neck: Normal range of motion.  Cardiovascular: Normal rate, regular rhythm and normal heart sounds.   Pulmonary/Chest: Effort normal and breath sounds normal. No respiratory distress.  She has no wheezes.  Abdominal: Soft. Bowel sounds are normal. There is no tenderness. There is no guarding.  Musculoskeletal:       Left knee: She exhibits decreased range of motion. She exhibits no swelling, no ecchymosis, no deformity and no laceration. Tenderness found. Medial joint line and lateral joint line tenderness noted.  Left knee without visible signs of trauma, generalized tenderness noted, no swelling or bony deformity; decreased ROM due to pain, no significant tendon laxity; compartments soft and easily compressible; leg is NVI; unable to bear weight on left leg at this time  Neurological: She is alert and oriented  to person, place, and time.  Skin: Skin is warm and dry.  Psychiatric: She has a normal mood and affect.  Nursing note and vitals reviewed.   ED Course  Procedures (including critical care time)  DIAGNOSTIC STUDIES: Oxygen Saturation is 100% on RA, normal by my interpretation.    COORDINATION OF CARE: 1:39 PM-Discussed treatment plan which includes DG L Knee and pain medication with pt at bedside and pt agreed to plan.   Labs Review Labs Reviewed - No data to display  Imaging Review Dg Knee Complete 4 Views Left  10/17/2014   CLINICAL DATA:  Slipped and fell on wet floor today. Left knee injury and pain. Initial encounter.  EXAM: LEFT KNEE - COMPLETE 4+ VIEW  COMPARISON:  None.  FINDINGS: There is no evidence of fracture, dislocation, or joint effusion. There is no evidence of arthropathy. Soft tissues are unremarkable.  IMPRESSION: Negative.   Electronically Signed   By: Earle Gell M.D.   On: 10/17/2014 14:09   I have personally reviewed and evaluated these images as part of my medical decision-making.   EKG Interpretation None      MDM   Final diagnoses:  Fall, initial encounter  Knee pain, left   44 year old female status post fall in puddle at [place] prior to arrival. No head injury or loss of consciousness. Twisting mechanism injury to left knee. She has generalized tenderness on exam without bony deformity or visible signs of trauma. No significant tendon laxity noted. Compartments are soft, easily compressible. Leg is neurovascularly intact. X-ray was obtained which is negative for acute findings. Given the patient's unable to bear weight on left leg at this time due to pain, will place in knee sleeve and make nonweightbearing. She'll be discharged home with Ultracet. She will follow-up with orthopedics if no improvement in the next week or if symptoms worsen.  Discussed plan with patient, he/she acknowledged understanding and agreed with plan of care.  Return  precautions given for new or worsening symptoms.  I personally performed the services described in this documentation, which was scribed in my presence. The recorded information has been reviewed and is accurate.     Larene Pickett, PA-C 10/17/14 1446  Gareth Morgan, MD 10/20/14 [phone]

## 2014-10-17 NOTE — Discharge Instructions (Signed)
Take the prescribed medication as directed as needed for pain.  May take motrin between dosing if needed. Use crutches to help assist with ambulation for now and progress back to full weight bearing as tolerated. Follow-up with orthopedics if no improvement in the next week or if symptoms worsen.

## 2014-10-17 NOTE — ED Notes (Signed)
Pt slipped in puddle. Pt fell and L leg turned inward. Pt reports pain in mid thigh down to right above ankle. Pt in with moving L knee. Less pain in L hip. Has not tried bearing weight on L leg

## 2014-10-17 NOTE — ED Notes (Signed)
Pt in xray

## 2014-10-17 NOTE — ED Notes (Signed)
Patient transported to X-ray 

## 2014-10-20 ENCOUNTER — Telehealth (HOSPITAL_COMMUNITY): Payer: Self-pay

## 2014-10-27 ENCOUNTER — Ambulatory Visit: Payer: Self-pay

## 2014-10-29 ENCOUNTER — Telehealth (HOSPITAL_BASED_OUTPATIENT_CLINIC_OR_DEPARTMENT_OTHER): Payer: Self-pay | Admitting: Emergency Medicine

## 2014-11-06 ENCOUNTER — Telehealth: Payer: Self-pay | Admitting: Oncology

## 2014-11-06 ENCOUNTER — Other Ambulatory Visit (HOSPITAL_BASED_OUTPATIENT_CLINIC_OR_DEPARTMENT_OTHER): Payer: Self-pay

## 2014-11-06 DIAGNOSIS — Z853 Personal history of malignant neoplasm of breast: Secondary | ICD-10-CM

## 2014-11-06 DIAGNOSIS — C50912 Malignant neoplasm of unspecified site of left female breast: Secondary | ICD-10-CM

## 2014-11-06 LAB — CBC WITH DIFFERENTIAL/PLATELET
BASO%: 1.1 % (ref 0.0–2.0)
BASOS ABS: 0.1 10*3/uL (ref 0.0–0.1)
EOS%: 0.5 % (ref 0.0–7.0)
Eosinophils Absolute: 0 10*3/uL (ref 0.0–0.5)
HCT: 36.5 % (ref 34.8–46.6)
HGB: 12.1 g/dL (ref 11.6–15.9)
LYMPH%: 44.3 % (ref 14.0–49.7)
MCH: 28 pg (ref 25.1–34.0)
MCHC: 33.1 g/dL (ref 31.5–36.0)
MCV: 84.7 fL (ref 79.5–101.0)
MONO#: 0.9 10*3/uL (ref 0.1–0.9)
MONO%: 10.6 % (ref 0.0–14.0)
NEUT%: 43.5 % (ref 38.4–76.8)
NEUTROS ABS: 3.5 10*3/uL (ref 1.5–6.5)
PLATELETS: 340 10*3/uL (ref 145–400)
RBC: 4.32 10*6/uL (ref 3.70–5.45)
RDW: 14.3 % (ref 11.2–14.5)
WBC: 8.2 10*3/uL (ref 3.9–10.3)
lymph#: 3.6 10*3/uL — ABNORMAL HIGH (ref 0.9–3.3)

## 2014-11-06 LAB — COMPREHENSIVE METABOLIC PANEL (CC13)
ALBUMIN: 3.8 g/dL (ref 3.5–5.0)
ALK PHOS: 82 U/L (ref 40–150)
ALT: 49 U/L (ref 0–55)
AST: 49 U/L — ABNORMAL HIGH (ref 5–34)
Anion Gap: 10 mEq/L (ref 3–11)
BILIRUBIN TOTAL: 0.44 mg/dL (ref 0.20–1.20)
BUN: 19.3 mg/dL (ref 7.0–26.0)
CO2: 24 meq/L (ref 22–29)
Calcium: 9.4 mg/dL (ref 8.4–10.4)
Chloride: 107 mEq/L (ref 98–109)
Creatinine: 1 mg/dL (ref 0.6–1.1)
EGFR: 77 mL/min/{1.73_m2} — AB (ref >90)
Glucose: 99 mg/dl (ref 70–140)
POTASSIUM: 4 meq/L (ref 3.5–5.1)
SODIUM: 141 meq/L (ref 136–145)
Total Protein: 7.2 g/dL (ref 6.4–8.3)

## 2014-11-06 NOTE — Telephone Encounter (Signed)
Patient stopped by after lab today to r/s appointment. Patient wishes to see someone else. GM full 10/28 f/u moved from [facility] to [facility] on 10/24. Patient has new appointment for 10/24 w/[facility].

## 2014-11-07 LAB — LIPID PANEL
Cholesterol: 208 mg/dL — ABNORMAL HIGH (ref 125–200)
HDL: 138 mg/dL (ref >=46)
LDL Cholesterol: 53 mg/dL (ref <130)
Total CHOL/HDL Ratio: 1.5 Ratio (ref <=5.0)
Triglycerides: 86 mg/dL (ref <150)
VLDL: 17 mg/dL (ref <30)

## 2014-11-07 LAB — FOLLICLE STIMULATING HORMONE: FSH: 128.1 m[IU]/mL — ABNORMAL HIGH

## 2014-11-10 ENCOUNTER — Encounter: Payer: Self-pay | Admitting: Oncology

## 2014-11-10 ENCOUNTER — Ambulatory Visit (HOSPITAL_BASED_OUTPATIENT_CLINIC_OR_DEPARTMENT_OTHER): Payer: Self-pay | Admitting: Oncology

## 2014-11-10 VITALS — BP 178/92 | HR 87 | Temp 98.5°F | Resp 18 | Ht 65.0 in | Wt 130.3 lb

## 2014-11-10 DIAGNOSIS — Z853 Personal history of malignant neoplasm of breast: Secondary | ICD-10-CM

## 2014-11-10 DIAGNOSIS — C50412 Malignant neoplasm of upper-outer quadrant of left female breast: Secondary | ICD-10-CM

## 2014-11-10 DIAGNOSIS — E538 Deficiency of other specified B group vitamins: Secondary | ICD-10-CM

## 2014-11-10 NOTE — Progress Notes (Signed)
ID: Sabrina Holland   DOB: 1970-10-17  MR#: [id number]  ZDG#:[id number]  PCP: No PCP Per Patient GYN: SUStar Age OTHER MD: Tyler Pita  CHIEF COMPLAINT: left breast cancer CURRENT TREATMENT: none, observation  BREAST CANCER HISTORY: The patient presented to Urgent Care at [facility] with a complaint of pain.  She had palpated a lump in her left breast and she was set up for mammography at the [facility] September 13, 2006.  The mammograms found an ill-defined mass in the upper left breast associated with calcifications.  The right breast was negative.  The left breast ultrasound found the mass to be approximately 2.6 cm, irregular and hypoechoic.  There were no obvious enlarged lymph nodes.  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  With this information the patient was referred to Dr. Marlou Starks and on September 7 bilateral breast MRIs were obtained.  This showed in the upper inner quadrant of the left breast a 2.5 cm enhancing mass and then more medial and inferior to that a 7 mm well-circumscribed nodule felt to likely represent an intramammary node.  In the right breast, there was a 7 mm nodule felt possibly to represent a small intramammary lymph node.  Accordingly, the patient had again bilateral breast ultrasounds on September 11.  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  The left breast axillary mass was also located, but the patient stated that she had decided she was going to have a left mastectomy and therefore this was not biopsied.  On October 16, 2006, the patient underwent left mastectomy with sentinel lymph node biopsy, both sentinel lymph nodes were negative.  There was also an intramammary lymph node, which was negative.  The breast  mass itself measured 2.7 cm.  It was a grade 3 invasive ductal carcinoma with evidence of lymphovascular invasion, but negative margins. Her subsequent history is as detailed below  INTERVAL HISTORY: [name] returns today for follow up of her breast cancer. The interval history is unremarkable. She continues to work at [employer] on [employer] daily. She has some trouble lifting heavy objects as well as with sweeping and mopping because of pain and weakness to her left side. Requests a new note for work. Plans to establish with a PCP (Dr. Kennon Holter) later today. Has not had a mammogram due to lack of insurance.    REVIEW OF SYSTEMS: Aaima denies fevers, chills, nausea, vomiting, or changes in bowel or bladder habits. She denies shortness of breath, chest pain, cough, palpitations, or fatigue. She has no unexplained weight loss, headaches, or dizziness. A detailed review of systems is otherwise noncontributory.   PAST MEDICAL HISTORY: Past Medical History  Diagnosis Date  . Breast cancer ([place])   . TIA (transient ischemic attack) 11/25/2011    "first time" (11/25/2011)  status post tubal ligation, status post tonsillectomy and adenoidectomy and history of atypical chest pain evaluated in the emergency room April 18, 2006 by Dr. Roderic Palau.  The patient had an EKG showing normal sinus rhythm and a negative chest x-ray at that time.  Her troponin was less than 0.05.  PAST SURGICAL HISTORY: Past Surgical History  Procedure Laterality Date  . Tonsillectomy and adenoidectomy      "when I was little" (11/25/2011)  . Mastectomy  09/2005    left  . Breast biopsy  2007    left  . Tubal ligation  1993    FAMILY HISTORY Family History  Problem Relation Age of Onset  . Heart disease Mother   . Sickle cell anemia Cousin     maternal cousin  The patient's  parents are alive  She has two brothers and one sister.  There is no history of breast or ovarian cancer in the family to her knowledge.  GYNECOLOGIC  HISTORY: She is GX, P4, first pregnancy age 44. Was not menstruating on tamoxifen, resumed some menstruation on anastrozole, again stopped during tamoxifen but resumed when tamoxifen was stopped November of 2013   SOCIAL HISTORY: She works at [employer] at [employer]. She is single; at home there is her [place] who works at [employer] and a son who is 4 who works in Ambulance person. The patient's daughters have moved out of the house. One of them is studying early childhood development at [employer].   ADVANCED DIRECTIVES: Not in place  HEALTH MAINTENANCE: Social History  Substance Use Topics  . Smoking status: Current Every Day Smoker -- 0.50 packs/day for 20 years    Types: Cigarettes  . Smokeless tobacco: Never Used  . Alcohol Use: 7.2 oz/week    12 Cans of beer per week     Comment: 11/25/2011 "40oz 4 times/wk"     Colonoscopy:  PAP:  Bone density:  Lipid panel:  Allergies  Allergen Reactions  . Penicillins Other (See Comments)    "I don't know; childhood allergy" (11/25/2011)    Current Outpatient Prescriptions  Medication Sig Dispense Refill  . diclofenac (CATAFLAM) 50 MG tablet Take 1 tablet (50 mg total) by mouth 3 (three) times daily. 15 tablet 0  . erythromycin ophthalmic ointment Place 1 application into both eyes every 6 (six) hours. 3.5 g 0  . HYDROcodone-acetaminophen (NORCO/VICODIN) 5-325 MG per tablet Take 1 tablet by mouth every 4 (four) hours as needed. 10 tablet 0  . Multiple Vitamin (MULITIVITAMIN WITH MINERALS) TABS Take 1 tablet by mouth daily.    . traMADol-acetaminophen (ULTRACET) 37.5-325 MG tablet Take 1 tablet by mouth every 6 (six) hours as needed. 15 tablet 0   No current facility-administered medications for this visit.    OBJECTIVE: Middle-aged [place] American woman who appears stated age 44 Vitals:   11/10/14 1336  BP: 178/92  Pulse: 87  Temp: 98.5 F (36.9 C)  Resp: 18     Body mass index is 21.68 kg/(m^2).    ECOG FS:  1 Filed Weights   11/10/14 1336  Weight: 130 lb 4.8 oz (59.104 kg)   Skin: warm, dry  HEENT: sclerae anicteric, conjunctivae pink, oropharynx clear. No thrush or mucositis.  Lymph Nodes: No cervical or supraclavicular lymphadenopathy  Lungs: clear to auscultation bilaterally, no rales, wheezes, or rhonci  Heart: regular rate and rhythm  Abdomen: round, soft, non tender, positive bowel sounds  Musculoskeletal: No focal spinal tenderness, no peripheral edema  Neuro: non focal, well oriented, positive affect  Breasts: left breast status post mastectomy. No evidence of local recurrence. Left axilla benign. Right breast unremarkable.   LAB RESULTS: Lab Results  Component Value Date   WBC 8.2 11/06/2014   NEUTROABS 3.5 11/06/2014   HGB 12.1 11/06/2014   HCT 36.5 11/06/2014   MCV 84.7 11/06/2014   PLT 340 11/06/2014      Chemistry      Component Value Date/Time   NA 141  11/06/2014 1430   NA 138 05/13/2013 0945   K 4.0 11/06/2014 1430   K 4.3 05/13/2013 0945   CL 102 05/13/2013 0945   CL 106 06/25/2012 1031   CO2 24 11/06/2014 1430   CO2 23 05/13/2013 0935   BUN 19.3 11/06/2014 1430   BUN 11 05/13/2013 0945   CREATININE 1.0 11/06/2014 1430   CREATININE 1.00 05/13/2013 0945      Component Value Date/Time   CALCIUM 9.4 11/06/2014 1430   CALCIUM 9.0 05/13/2013 0935   ALKPHOS 82 11/06/2014 1430   ALKPHOS 105 05/13/2013 0935   AST 49* 11/06/2014 1430   AST 87* 05/13/2013 0935   ALT 49 11/06/2014 1430   ALT 53* 05/13/2013 0935   BILITOT 0.44 11/06/2014 1430   BILITOT 0.3 05/13/2013 0935       Lab Results  Component Value Date   LABCA2 12 07/18/2011    STUDIES: No results found.  ASSESSMENT: 44 y.o. 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   (1) status-post adjuvant chemotherapy consisting of 4 dose-dense cycles of doxorubicin and cyclophosphamide.  (2)  on tamoxifen between January 2009 and January 2013, when she was switched to letrozole, stopped due to side effects. Tamoxifen resumed 07/18/2011, then discontinued in November 2013 after an apparent TIA.  Aromatase inhibitors were not resumed  (3) B-12 deficiency   PLAN:  Raegyn and I spent about 30 minutes discussing health maintenance topics. The labs were reviewed in detail and continue to show elevated AST levels. She is overdue for her mammogram, but was agreeable to setting this appointment up. I have referred her to the [facility] to help cover the cost of this procedure. I also wrote her a letter with lifting restrictions to under 30lb and to remove sweeping or mopping from her duties at work.  Jacaria will return next year for a long-term survivor visit. She understands and agrees with this plan. She knows the goal of treatment in her case is cure. She has been encouraged to call with any issues that might arise before her next visit here.   Mikey Bussing    11/10/2014

## 2014-11-11 LAB — ESTRADIOL, ULTRA SENS: Estradiol, Ultra Sensitive: 5 pg/mL

## 2014-11-13 ENCOUNTER — Ambulatory Visit: Payer: Self-pay | Admitting: Oncology

## 2014-11-14 ENCOUNTER — Ambulatory Visit: Payer: Self-pay | Admitting: Nurse Practitioner

## 2014-12-24 ENCOUNTER — Emergency Department (HOSPITAL_COMMUNITY)
Admission: EM | Admit: 2014-12-24 | Discharge: 2014-12-24 | Disposition: A | Discharge status: Home / Self Care | Payer: Self-pay | Attending: Emergency Medicine | Admitting: Emergency Medicine

## 2014-12-24 ENCOUNTER — Encounter (HOSPITAL_COMMUNITY): Payer: Self-pay | Admitting: Emergency Medicine

## 2014-12-24 DIAGNOSIS — Z880 Allergy status to penicillin: Secondary | ICD-10-CM | POA: Insufficient documentation

## 2014-12-24 DIAGNOSIS — Z853 Personal history of malignant neoplasm of breast: Secondary | ICD-10-CM | POA: Insufficient documentation

## 2014-12-24 DIAGNOSIS — R059 Cough, unspecified: Secondary | ICD-10-CM

## 2014-12-24 DIAGNOSIS — Z8673 Personal history of transient ischemic attack (TIA), and cerebral infarction without residual deficits: Secondary | ICD-10-CM | POA: Insufficient documentation

## 2014-12-24 DIAGNOSIS — I10 Essential (primary) hypertension: Secondary | ICD-10-CM | POA: Insufficient documentation

## 2014-12-24 DIAGNOSIS — F17210 Nicotine dependence, cigarettes, uncomplicated: Secondary | ICD-10-CM | POA: Insufficient documentation

## 2014-12-24 DIAGNOSIS — R05 Cough: Secondary | ICD-10-CM | POA: Insufficient documentation

## 2014-12-24 DIAGNOSIS — R197 Diarrhea, unspecified: Secondary | ICD-10-CM | POA: Insufficient documentation

## 2014-12-24 HISTORY — DX: Essential (primary) hypertension: I10

## 2014-12-24 MED ORDER — BENZONATATE 100 MG PO CAPS: 100.0000 mg | ORAL_CAPSULE | Freq: Three times a day (TID) | ORAL | Status: DC

## 2014-12-24 MED ORDER — DIPHENOXYLATE-ATROPINE 2.5-0.025 MG PO TABS: 1.0000 | ORAL_TABLET | Freq: Four times a day (QID) | ORAL | Status: DC | PRN

## 2014-12-24 NOTE — ED Provider Notes (Signed)
CSN: [id number]     Arrival date & time 12/24/14  [id number] History   First MD Initiated Contact with Patient 12/24/14 [phone]     Chief Complaint  Patient presents with  . Cough  . Diarrhea      HPI  Presents evaluation of a cough and diarrhea. Cough are weak. States she feels like it is "common". However she's coughing and tonight. Nonproductive dry cough. Some mild body aches. No fevers or chills. Except for small for diarrhea yesterday. No symmetric stool this morning. However more formed. No frank blood. No loose stool today. No vomiting or diarrhea. Denies chest pain or shortness of breath. Did not receive influenza vaccination.  Past Medical History  Diagnosis Date  . Breast cancer ([place])   . TIA (transient ischemic attack) 11/25/2011    "first time" (11/25/2011)  . Hypertension    Past Surgical History  Procedure Laterality Date  . Tonsillectomy and adenoidectomy      "when I was little" (11/25/2011)  . Mastectomy  09/2005    left  . Breast biopsy  2007    left  . Tubal ligation  1993   Family History  Problem Relation Age of Onset  . Heart disease Mother   . Sickle cell anemia Cousin     maternal cousin   Social History  Substance Use Topics  . Smoking status: Current Every Day Smoker -- 0.50 packs/day for 20 years    Types: Cigarettes  . Smokeless tobacco: Never Used  . Alcohol Use: 7.2 oz/week    12 Cans of beer per week     Comment: occasionally   OB History    Gravida Para Term Preterm AB TAB SAB Ectopic Multiple Living   5 4 4  1  1   4      Review of Systems  Constitutional: Negative for fever, chills, diaphoresis, appetite change and fatigue.  HENT: Negative for mouth sores, sore throat and trouble swallowing.   Eyes: Negative for visual disturbance.  Respiratory: Positive for cough. Negative for chest tightness, shortness of breath and wheezing.   Cardiovascular: Negative for chest pain.  Gastrointestinal: Positive for diarrhea. Negative for nausea,  vomiting, abdominal pain and abdominal distention.  Endocrine: Negative for polydipsia, polyphagia and polyuria.  Genitourinary: Negative for dysuria, frequency and hematuria.  Musculoskeletal: Negative for gait problem.  Skin: Negative for color change, pallor and rash.  Neurological: Negative for dizziness, syncope, light-headedness and headaches.  Hematological: Does not bruise/bleed easily.  Psychiatric/Behavioral: Negative for behavioral problems and confusion.      Allergies  Penicillins  Home Medications   Prior to Admission medications   Medication Sig Start Date End Date Taking? Authorizing Provider  DM-Doxylamine-Acetaminophen (NYQUIL COLD & FLU PO) Take 2 capsules by mouth at bedtime as needed (cold symptoms).   Yes Historical Provider, MD  benzonatate (TESSALON) 100 MG capsule Take 1 capsule (100 mg total) by mouth every 8 (eight) hours. 12/24/14   Tanna Furry, MD  diclofenac (CATAFLAM) 50 MG tablet Take 1 tablet (50 mg total) by mouth 3 (three) times daily. Patient not taking: Reported on 12/24/2014 09/18/14   Billy Fischer, MD  diphenoxylate-atropine (LOMOTIL) 2.5-0.025 MG tablet Take 1 tablet by mouth 4 (four) times daily as needed for diarrhea or loose stools. 12/24/14   Tanna Furry, MD  erythromycin ophthalmic ointment Place 1 application into both eyes every 6 (six) hours. Patient not taking: Reported on 12/24/2014 10/15/14   Jarrett Soho Muthersbaugh, PA-C  HYDROcodone-acetaminophen (NORCO/VICODIN) 5-325 MG per  tablet Take 1 tablet by mouth every 4 (four) hours as needed. Patient not taking: Reported on 12/24/2014 05/13/13   Serita Grit, MD  traMADol-acetaminophen (ULTRACET) 37.5-325 MG tablet Take 1 tablet by mouth every 6 (six) hours as needed. Patient not taking: Reported on 12/24/2014 10/17/14   Larene Pickett, PA-C   BP 152/85 mmHg  Pulse 82  Temp(Src) 98.3 F (36.8 C) (Oral)  Resp 17  Ht 5\' 6"  (1.676 m)  Wt 130 lb (58.968 kg)  BMI 20.99 kg/m2  SpO2 98% Physical Exam   Constitutional: She is oriented to person, place, and time. She appears well-developed and well-nourished. No distress.  HENT:  Head: Normocephalic.  Eyes: Conjunctivae are normal. Pupils are equal, round, and reactive to light. No scleral icterus.  Neck: Normal range of motion. Neck supple. No thyromegaly present.  Cardiovascular: Normal rate and regular rhythm.  Exam reveals no gallop and no friction rub.   No murmur heard. Pulmonary/Chest: Effort normal and breath sounds normal. No respiratory distress. She has no wheezes. She has no rales.  Abdominal: Soft. Bowel sounds are normal. She exhibits no distension. There is no tenderness. There is no rebound.  Musculoskeletal: Normal range of motion.  Neurological: She is alert and oriented to person, place, and time.  Skin: Skin is warm and dry. No rash noted.  Psychiatric: She has a normal mood and affect. Her behavior is normal.    ED Course  Procedures (including critical care time) Labs Review Labs Reviewed - No data to display  Imaging Review No results found. I have personally reviewed and evaluated these images and lab results as part of my medical decision-making.   EKG Interpretation None      MDM   Final diagnoses:  Cough  Diarrhea, unspecified type    Patient presents for evaluation of cough and diarrhea. Has no abnormalities to suggest focal infection or separate infection. Benign abdomen. No sinus symptoms. Clear lungs. No fever or hypoxemia. Appropriate for symptomatic treatment.    Tanna Furry, MD 12/24/14 1037

## 2014-12-24 NOTE — Discharge Instructions (Signed)
Diarrhea Diarrhea is watery poop (stool). It can make you feel weak, tired, thirsty, or give you a dry mouth (signs of dehydration). Watery poop is a sign of another problem, most often an infection. It often lasts 2-3 days. It can last longer if it is a sign of something serious. Take care of yourself as told by your doctor. HOME CARE   Drink 1 cup (8 ounces) of fluid each time you have watery poop.  Do not drink the following fluids:  Those that contain simple sugars (fructose, glucose, galactose, lactose, sucrose, maltose).  Sports drinks.  Fruit juices.  Whole milk products.  Sodas.  Drinks with caffeine (coffee, tea, soda) or alcohol.  Oral rehydration solution may be used if the doctor says it is okay. You may make your own solution. Follow this recipe:   - teaspoon table salt.   teaspoon baking soda.   teaspoon salt substitute containing potassium chloride.  1 tablespoons sugar.  1 liter (34 ounces) of water.  Avoid the following foods:  High fiber foods, such as raw fruits and vegetables.  Nuts, seeds, and whole grain breads and cereals.   Those that are sweetened with sugar alcohols (xylitol, sorbitol, mannitol).  Try eating the following foods:  Starchy foods, such as rice, toast, pasta, low-sugar cereal, oatmeal, baked potatoes, crackers, and bagels.  Bananas.  Applesauce.  Eat probiotic-rich foods, such as yogurt and milk products that are fermented.  Wash your hands well after each time you have watery poop.  Only take medicine as told by your doctor.  Take a warm bath to help lessen burning or pain from having watery poop. GET HELP RIGHT AWAY IF:   You cannot drink fluids without throwing up (vomiting).  You keep throwing up.  You have blood in your poop, or your poop looks black and tarry.  You do not pee (urinate) in 6-8 hours, or there is only a small amount of very dark pee.  You have belly (abdominal) pain that gets worse or stays  in the same spot (localizes).  You are weak, dizzy, confused, or light-headed.  You have a very bad headache.  Your watery poop gets worse or does not get better.  You have a fever or lasting symptoms for more than 2-3 days.  You have a fever and your symptoms suddenly get worse. MAKE SURE YOU:   Understand these instructions.  Will watch your condition.  Will get help right away if you are not doing well or get worse.   This information is not intended to replace advice given to you by your health care provider. Make sure you discuss any questions you have with your health care provider.   Document Released: 06/22/2007 Document Revised: 01/24/2014 Document Reviewed: 09/11/2011 Elsevier Interactive Patient Education 2016 Else[employer]  Cough, Adult A cough helps to clear your throat and lungs. A cough may last only 2-3 weeks (acute), or it may last longer than 8 weeks (chronic). Many different things can cause a cough. A cough may be a sign of an illness or another medical condition. HOME CARE  Pay attention to any changes in your cough.  Take medicines only as told by your doctor.  If you were prescribed an antibiotic medicine, take it as told by your doctor. Do not stop taking it even if you start to feel better.  Talk with your doctor before you try using a cough medicine.  Drink enough fluid to keep your pee (urine) clear or pale  yellow.  If the air is dry, use a cold steam vaporizer or humidifier in your home.  Stay away from things that make you cough at work or at home.  If your cough is worse at night, try using extra pillows to raise your head up higher while you sleep.  Do not smoke, and try not to be around smoke. If you need help quitting, ask your doctor.  Do not have caffeine.  Do not drink alcohol.  Rest as needed. GET HELP IF:  You have new problems (symptoms).  You cough up yellow fluid (pus).  Your cough does not get better after 2-3 weeks,  or your cough gets worse.  Medicine does not help your cough and you are not sleeping well.  You have pain that gets worse or pain that is not helped with medicine.  You have a fever.  You are losing weight and you do not know why.  You have night sweats. GET HELP RIGHT AWAY IF:  You cough up blood.  You have trouble breathing.  Your heartbeat is very fast.   This information is not intended to replace advice given to you by your health care provider. Make sure you discuss any questions you have with your health care provider.   Document Released: 09/16/2010 Document Revised: 09/24/2014 Document Reviewed: 03/12/2014 Elsevier Interactive Patient Education [employer].

## 2014-12-24 NOTE — ED Notes (Addendum)
Patient reports that she has been having diarrhea x 1 week and this AM she noticed that her stool was black. Patient also reports a productive cough with yellow/green sputum x 3 days and body aches since yesterday.

## 2014-12-24 NOTE — ED Notes (Signed)
Patient states has had a cough/cold x1 week.  Been unable to eat meals w/o Diarrhea since yesterday.  Patient states has generalized body aches.  States pain 10/10.  Breathing even and unlabored, NAD at this time.

## 2014-12-24 NOTE — ED Notes (Signed)
Patient d/c'd self care.  F/U and medications reviewed.  Patient verbalized understanding. 

## 2015-02-06 ENCOUNTER — Encounter (HOSPITAL_COMMUNITY): Payer: Self-pay

## 2015-02-06 ENCOUNTER — Emergency Department (HOSPITAL_COMMUNITY): Payer: Self-pay

## 2015-02-06 ENCOUNTER — Emergency Department (HOSPITAL_COMMUNITY)
Admission: EM | Admit: 2015-02-06 | Discharge: 2015-02-06 | Disposition: A | Discharge status: Home / Self Care | Payer: Self-pay | Attending: Emergency Medicine | Admitting: Emergency Medicine

## 2015-02-06 DIAGNOSIS — F17210 Nicotine dependence, cigarettes, uncomplicated: Secondary | ICD-10-CM | POA: Insufficient documentation

## 2015-02-06 DIAGNOSIS — R05 Cough: Secondary | ICD-10-CM

## 2015-02-06 DIAGNOSIS — I10 Essential (primary) hypertension: Secondary | ICD-10-CM | POA: Insufficient documentation

## 2015-02-06 DIAGNOSIS — J069 Acute upper respiratory infection, unspecified: Secondary | ICD-10-CM | POA: Insufficient documentation

## 2015-02-06 DIAGNOSIS — Z8673 Personal history of transient ischemic attack (TIA), and cerebral infarction without residual deficits: Secondary | ICD-10-CM | POA: Insufficient documentation

## 2015-02-06 DIAGNOSIS — Z853 Personal history of malignant neoplasm of breast: Secondary | ICD-10-CM | POA: Insufficient documentation

## 2015-02-06 DIAGNOSIS — R059 Cough, unspecified: Secondary | ICD-10-CM

## 2015-02-06 DIAGNOSIS — R0789 Other chest pain: Secondary | ICD-10-CM | POA: Insufficient documentation

## 2015-02-06 DIAGNOSIS — Z880 Allergy status to penicillin: Secondary | ICD-10-CM | POA: Insufficient documentation

## 2015-02-06 MED ORDER — ALBUTEROL SULFATE HFA 108 (90 BASE) MCG/ACT IN AERS
2.0000 | INHALATION_SPRAY | RESPIRATORY_TRACT | Status: DC | PRN
  Administered 2015-02-06: 2 via RESPIRATORY_TRACT
  Filled 2015-02-06: qty 6.7

## 2015-02-06 MED ORDER — BENZONATATE 100 MG PO CAPS: 100.0000 mg | ORAL_CAPSULE | Freq: Three times a day (TID) | ORAL | Status: DC

## 2015-02-06 NOTE — ED Notes (Signed)
Pt ambulating independently w/ steady gait on d/c in no acute distress, A&Ox4. D/c instructions reviewed w/ pt - pt denies any further questions or concerns at present. Rx given x1  

## 2015-02-06 NOTE — ED Provider Notes (Signed)
CSN: [id number]     Arrival date & time 02/06/15  1120 History   First MD Initiated Contact with Patient 02/06/15 1153     Chief Complaint  Patient presents with  . Cough  . Chest Congestion      (Consider location/radiation/quality/duration/timing/severity/associated sxs/prior Treatment) HPI Comments: Patient presents today with complaints of productive cough and nasal congestion.  She reports onset of symptoms two weeks ago, which have been progressively worsening.  She has taken Nyquil for her symptoms without improvement.  She reports intermittent chest tightness and states that she has been having wheezing at night.  She also reports intermittent SOB.  She denies hemoptysis, fever, chills, nausea, or vomiting.  She denies any chest pain at this time.  She smokes 2-3 cigarettes daily.  She denies LE edema.  Denies history of PE or DVT.  Denies prolonged travel or surgeries in the past 4 weeks.  Denies exogenous estrogen use.    Patient is a 46 y.o. female presenting with cough. The history is provided by the patient.  Cough   Past Medical History  Diagnosis Date  . Breast cancer ([place])   . TIA (transient ischemic attack) 11/25/2011    "first time" (11/25/2011)  . Hypertension    Past Surgical History  Procedure Laterality Date  . Tonsillectomy and adenoidectomy      "when I was little" (11/25/2011)  . Mastectomy  09/2005    left  . Breast biopsy  2007    left  . Tubal ligation  1993   Family History  Problem Relation Age of Onset  . Heart disease Mother   . Sickle cell anemia Cousin     maternal cousin   Social History  Substance Use Topics  . Smoking status: Current Every Day Smoker -- 0.50 packs/day for 20 years    Types: Cigarettes  . Smokeless tobacco: Never Used  . Alcohol Use: 7.2 oz/week    12 Cans of beer per week     Comment: occasionally   OB History    Gravida Para Term Preterm AB TAB SAB Ectopic Multiple Living   5 4 4  1  1   4      Review of Systems   HENT: Positive for congestion.   Respiratory: Positive for cough.   All other systems reviewed and are negative.     Allergies  Penicillins  Home Medications   Prior to Admission medications   Medication Sig Start Date End Date Taking? Authorizing Provider  DM-Doxylamine-Acetaminophen (NYQUIL COLD & FLU PO) Take 2 capsules by mouth at bedtime as needed (cold symptoms).   Yes Historical Provider, MD  benzonatate (TESSALON) 100 MG capsule Take 1 capsule (100 mg total) by mouth every 8 (eight) hours. Patient not taking: Reported on 02/06/2015 12/24/14   Tanna Furry, MD  diclofenac (CATAFLAM) 50 MG tablet Take 1 tablet (50 mg total) by mouth 3 (three) times daily. Patient not taking: Reported on 12/24/2014 09/18/14   Billy Fischer, MD  diphenoxylate-atropine (LOMOTIL) 2.5-0.025 MG tablet Take 1 tablet by mouth 4 (four) times daily as needed for diarrhea or loose stools. 12/24/14   Tanna Furry, MD  erythromycin ophthalmic ointment Place 1 application into both eyes every 6 (six) hours. Patient not taking: Reported on 12/24/2014 10/15/14   Jarrett Soho Muthersbaugh, PA-C  HYDROcodone-acetaminophen (NORCO/VICODIN) 5-325 MG per tablet Take 1 tablet by mouth every 4 (four) hours as needed. Patient not taking: Reported on 12/24/2014 05/13/13   Serita Grit, MD  traMADol-acetaminophen Caroline Sauger)  37.5-325 MG tablet Take 1 tablet by mouth every 6 (six) hours as needed. Patient not taking: Reported on 12/24/2014 10/17/14   Larene Pickett, PA-C   BP 144/89 mmHg  Pulse 88  Temp(Src) 98.1 F (36.7 C) (Oral)  Resp 14  SpO2 100% Physical Exam  Constitutional: She appears well-developed and well-nourished.  HENT:  Head: Normocephalic and atraumatic.  Right Ear: Tympanic membrane and ear canal normal.  Left Ear: Tympanic membrane and ear canal normal.  Nose: Mucosal edema present.  Mouth/Throat: Oropharynx is clear and moist.  Neck: Normal range of motion. Neck supple.  Cardiovascular: Normal rate, regular rhythm  and normal heart sounds.   Pulmonary/Chest: Effort normal and breath sounds normal. No respiratory distress. She has no wheezes. She has no rales.  Musculoskeletal: Normal range of motion.  No LE edema or erythema  Neurological: She is alert.  Skin: Skin is warm and dry.  Psychiatric: She has a normal mood and affect.  Nursing note and vitals reviewed.   ED Course  Procedures (including critical care time) Labs Review Labs Reviewed - No data to display  Imaging Review Dg Chest 2 View  02/06/2015  CLINICAL DATA:  Cough and congestion for 2 weeks. Shortness of breath and wheezing EXAM: CHEST  2 VIEW COMPARISON:  05/13/2013 FINDINGS: Biapical pleural thickening is stable and mild. Probable paraseptal emphysema at the apices. Normal heart size and stable aortic contours. There is no edema, consolidation, effusion, or pneumothorax. IMPRESSION: No active cardiopulmonary disease. Electronically Signed   By: Monte Fantasia M.D.   On: 02/06/2015 12:52   I have personally reviewed and evaluated these images and lab results as part of my medical decision-making.   EKG Interpretation None      MDM   Final diagnoses:  Cough   Patient presents today with a chief complaint of cough and nasal congestion x 2 weeks.  Lungs CTAB.  Oxygen sat 100 on RA.  CXR is negative.  No signs of respiratory distress.  Feel that the patient is stable for discharge.  Return precautions given.    Hyman Bible, PA-C 02/07/15 [place], MD 02/09/15 [phone]

## 2015-02-06 NOTE — ED Notes (Signed)
Pt c/o cough, chest congestion, and nasal congestion x 3 weeks and chest tightness starting this morning.  Pain score 8/10.  Pt reports "I had a cold, but I can't shake this cough."  Hx of breast CA, but not receiving treatment.  Pt reports taking OTC medications w/o relief.

## 2015-07-07 ENCOUNTER — Telehealth: Payer: Self-pay | Admitting: *Deleted

## 2015-07-07 ENCOUNTER — Encounter: Payer: Self-pay | Admitting: *Deleted

## 2015-07-07 NOTE — Telephone Encounter (Signed)
Letter obtained and left at front desk for pt.

## 2015-07-07 NOTE — Telephone Encounter (Signed)
Received call from pt requesting a letter/note from Dr Jana Hakim stating diagnosis & need of electricity so she can get assistance on her light bill.  She can be reached at [phone].  Message to Dr Magrinat/Pod RN

## 2015-10-16 ENCOUNTER — Emergency Department (HOSPITAL_COMMUNITY): Payer: Self-pay

## 2015-10-16 ENCOUNTER — Encounter (HOSPITAL_COMMUNITY): Payer: Self-pay

## 2015-10-16 ENCOUNTER — Emergency Department (HOSPITAL_COMMUNITY)
Admission: EM | Admit: 2015-10-16 | Discharge: 2015-10-16 | Disposition: A | Discharge status: Home / Self Care | Payer: Self-pay | Attending: Emergency Medicine | Admitting: Emergency Medicine

## 2015-10-16 DIAGNOSIS — M542 Cervicalgia: Secondary | ICD-10-CM | POA: Insufficient documentation

## 2015-10-16 DIAGNOSIS — R5383 Other fatigue: Secondary | ICD-10-CM | POA: Insufficient documentation

## 2015-10-16 DIAGNOSIS — R0789 Other chest pain: Secondary | ICD-10-CM | POA: Insufficient documentation

## 2015-10-16 DIAGNOSIS — Z853 Personal history of malignant neoplasm of breast: Secondary | ICD-10-CM | POA: Insufficient documentation

## 2015-10-16 DIAGNOSIS — F1290 Cannabis use, unspecified, uncomplicated: Secondary | ICD-10-CM | POA: Insufficient documentation

## 2015-10-16 DIAGNOSIS — R110 Nausea: Secondary | ICD-10-CM | POA: Insufficient documentation

## 2015-10-16 DIAGNOSIS — F17210 Nicotine dependence, cigarettes, uncomplicated: Secondary | ICD-10-CM | POA: Insufficient documentation

## 2015-10-16 DIAGNOSIS — M79602 Pain in left arm: Secondary | ICD-10-CM | POA: Insufficient documentation

## 2015-10-16 DIAGNOSIS — R51 Headache: Secondary | ICD-10-CM | POA: Insufficient documentation

## 2015-10-16 DIAGNOSIS — I10 Essential (primary) hypertension: Secondary | ICD-10-CM | POA: Insufficient documentation

## 2015-10-16 LAB — BASIC METABOLIC PANEL
Anion gap: 10 (ref 5–15)
BUN: 13 mg/dL (ref 6–20)
CALCIUM: 9.4 mg/dL (ref 8.9–10.3)
CO2: 25 mmol/L (ref 22–32)
CREATININE: 0.85 mg/dL (ref 0.44–1.00)
Chloride: 101 mmol/L (ref 101–111)
GFR calc Af Amer: >60 mL/min (ref >60)
Glucose, Bld: 102 mg/dL — ABNORMAL HIGH (ref 65–99)
POTASSIUM: 4 mmol/L (ref 3.5–5.1)
SODIUM: 136 mmol/L (ref 135–145)

## 2015-10-16 LAB — CBC
HCT: 36.1 % (ref 36.0–46.0)
Hemoglobin: 12.2 g/dL (ref 12.0–15.0)
MCH: 27.7 pg (ref 26.0–34.0)
MCHC: 33.8 g/dL (ref 30.0–36.0)
MCV: 81.9 fL (ref 78.0–100.0)
PLATELETS: 365 10*3/uL (ref 150–400)
RBC: 4.41 MIL/uL (ref 3.87–5.11)
RDW: 14.5 % (ref 11.5–15.5)
WBC: 6.1 10*3/uL (ref 4.0–10.5)

## 2015-10-16 LAB — I-STAT TROPONIN, ED: Troponin i, poc: 0.01 ng/mL (ref 0.00–0.08)

## 2015-10-16 MED ORDER — SODIUM CHLORIDE 0.9 % IV BOLUS (SEPSIS)
500.0000 mL | Freq: Once | INTRAVENOUS | Status: AC
  Administered 2015-10-16: 500 mL via INTRAVENOUS

## 2015-10-16 MED ORDER — ONDANSETRON HCL 4 MG/2ML IJ SOLN
4.0000 mg | Freq: Once | INTRAMUSCULAR | Status: AC
  Administered 2015-10-16: 4 mg via INTRAVENOUS
  Filled 2015-10-16: qty 2

## 2015-10-16 MED ORDER — AMLODIPINE BESYLATE 5 MG PO TABS: 5.0000 mg | ORAL_TABLET | Freq: Every day | ORAL | 2 refills | Status: DC

## 2015-10-16 MED ORDER — HYDROMORPHONE HCL 1 MG/ML IJ SOLN
1.0000 mg | Freq: Once | INTRAMUSCULAR | Status: AC
  Administered 2015-10-16: 1 mg via INTRAVENOUS
  Filled 2015-10-16: qty 1

## 2015-10-16 MED ORDER — SODIUM CHLORIDE 0.9 % IV SOLN
INTRAVENOUS | Status: DC
  Administered 2015-10-16: 10:00:00 via INTRAVENOUS

## 2015-10-16 NOTE — ED Triage Notes (Addendum)
Pt c/o L side headache w/ pain radiating down L side of body (L neck, L arm, L chest, etc) and generalized body aches starting yesterday and L chest discomfort w/ deep breathing starting this morning.  Pain score 10/10.  Hx of HTN (uncontrolled), TIA, and Breast CA.  Pt reports taking Aleve yesterday w/o relief.

## 2015-10-16 NOTE — ED Provider Notes (Signed)
[facility] DEPT Provider Note   CSN: [id number] Arrival date & time: 10/16/15  0846     History   Chief Complaint Chief Complaint  Patient presents with  . Headache  . Generalized Body Aches  . Chest Pain    HPI Sabrina Holland is a 45 y.o. female.  Patient with complaint of left-sided body pain. Includes left-sided head neck and left arm left chest left leg. Symptoms started yesterday. Patient states that chest discomfort worse with taking a deep breath. Patient does not feel short of breath. Room air oxygen levels on presentation was 100%. Patient states pain is 10 out of 10. Patient states she has a history of high blood pressure and she been off her blood pressure medicines for the past 2 months and she thinks that her symptoms will be are related to the elevation of blood pressure. Patient also is a breast cancer patient currently in remission followed by hematology oncology here on an annual basis.      Past Medical History:  Diagnosis Date  . Breast cancer ([place])   . Hypertension   . TIA (transient ischemic attack) 11/25/2011   "first time" (11/25/2011)    Patient Active Problem List   Diagnosis Date Noted  . Breast cancer of upper-outer quadrant of left female breast ([place]) 11/12/2012  . Polysubstance abuse 11/26/2011  . TIA (transient ischemic attack) 11/25/2011  . HTN (hypertension) 11/25/2011  . Sinusitis 11/25/2011  . Vitamin B 12 deficiency 07/18/2011    Past Surgical History:  Procedure Laterality Date  . BREAST BIOPSY  2007   left  . MASTECTOMY  09/2005   left  . TONSILLECTOMY AND ADENOIDECTOMY     "when I was little" (11/25/2011)  . TUBAL LIGATION  1993    OB History    Gravida Para Term Preterm AB Living   5 4 4   1 4    SAB TAB Ectopic Multiple Live Births   1               Home Medications    Prior to Admission medications   Medication Sig Start Date End Date Taking? Authorizing Provider  Multiple Vitamin (MULTIVITAMIN WITH  MINERALS) TABS tablet Take 1 tablet by mouth daily.   Yes Historical Provider, MD  naproxen sodium (ANAPROX) 220 MG tablet Take 440 mg by mouth 2 (two) times daily as needed (pain).   Yes Historical Provider, MD  amLODipine (NORVASC) 5 MG tablet Take 1 tablet (5 mg total) by mouth daily. 10/16/15   Fredia Sorrow, MD    Family History Family History  Problem Relation Age of Onset  . Heart disease Mother   . Sickle cell anemia Cousin     maternal cousin    Social History Social History  Substance Use Topics  . Smoking status: Current Every Day Smoker    Packs/day: 0.00    Years: 20.00    Types: Cigarettes  . Smokeless tobacco: Never Used  . Alcohol use 7.2 oz/week    12 Cans of beer per week     Comment: occasionally     Allergies   Penicillins   Review of Systems Review of Systems  Constitutional: Positive for fatigue. Negative for fever.  HENT: Negative for congestion.   Eyes: Negative for visual disturbance.  Respiratory: Negative for shortness of breath.   Cardiovascular: Positive for chest pain.  Gastrointestinal: Positive for nausea. Negative for abdominal pain.  Genitourinary: Negative for dysuria.  Musculoskeletal: Positive for back pain and neck  pain.  Skin: Negative for rash.  Neurological: Positive for headaches.  Hematological: Does not bruise/bleed easily.  Psychiatric/Behavioral: Negative for confusion.     Physical Exam Updated Vital Signs BP (!) 159/106 (BP Location: Right Arm)   Pulse 98   Temp 98.3 F (36.8 C) (Oral)   Resp 18   Ht 5\' 5"  (1.651 m)   Wt 59 kg   SpO2 100%   BMI 21.63 kg/m   Physical Exam  Constitutional: She is oriented to person, place, and time. She appears well-developed and well-nourished.  HENT:  Head: Normocephalic and atraumatic.  Mouth/Throat: Oropharynx is clear and moist.  Eyes: Conjunctivae and EOM are normal. Pupils are equal, round, and reactive to light.  Neck: Normal range of motion. Neck supple.    Cardiovascular: Normal rate, regular rhythm and normal heart sounds.   Pulmonary/Chest: Effort normal and breath sounds normal. No respiratory distress.  Abdominal: Soft. Bowel sounds are normal. There is no tenderness.  Neurological: She is alert and oriented to person, place, and time. No cranial nerve deficit. She exhibits normal muscle tone. Coordination normal.  Skin: Skin is warm.  Nursing note and vitals reviewed.    ED Treatments / Results  Labs (all labs ordered are listed, but only abnormal results are displayed) Labs Reviewed  BASIC METABOLIC PANEL - Abnormal; Notable for the following:       Result Value   Glucose, Bld 102 (*)    All other components within normal limits  CBC  I-STAT TROPOININ, ED    EKG  EKG Interpretation  Date/Time:  Friday October 16 2015 09:15:12 EDT Ventricular Rate:  80 PR Interval:    QRS Duration: 79 QT Interval:  401 QTC Calculation: 463 R Axis:   62 Text Interpretation:  Sinus rhythm Probable left atrial enlargement Left ventricular hypertrophy Nonspecific T abnrm, anterolateral leads No significant change since last tracing Confirmed by Bralee Feldt  MD, Omayra Tulloch [phone]) on 10/16/2015 9:19:02 AM       Radiology Dg Chest 2 View  Result Date: 10/16/2015 CLINICAL DATA:  Chest pain and cough.  Shortness of breath. EXAM: CHEST  2 VIEW COMPARISON:  February 06, 2015 FINDINGS: There is no edema or consolidation. The heart size and pulmonary vascularity are normal. No adenopathy. No pneumothorax. There is upper thoracic levoscoliosis. IMPRESSION: No edema or consolidation. Electronically Signed   By: Lowella Grip III M.D.   On: 10/16/2015 09:35   Ct Head Wo Contrast  Result Date: 10/16/2015 CLINICAL DATA:  L side headache w/ pain radiating down L side of body (L neck, L arm, L chest, etc) and generalized body aches starting yesterday and L chest discomfort w/ deep breathing starting this morning. Pain score 10/10. Hx of HTN (uncontrolled),  TIA, and Breast CA. Pt reports taking Aleve yesterday w/o relief. EXAM: CT HEAD WITHOUT CONTRAST TECHNIQUE: Contiguous axial images were obtained from the base of the skull through the vertex without intravenous contrast. COMPARISON:  MRI 11/25/2011 and previous FINDINGS: Brain: No evidence of acute infarction, hemorrhage, hydrocephalus, extra-axial collection or mass lesion/mass effect. Mild atrophy. Incidental cavum vergae cyst. Vascular: No hyperdense vessel or unexpected calcification. Skull: Normal. Negative for fracture or focal lesion. Sinuses/Orbits: No acute finding. IMPRESSION: 1. No bleed or other acute intracranial process. Electronically Signed   By: Lucrezia Europe M.D.   On: 10/16/2015 11:08    Procedures Procedures (including critical care time)  Medications Ordered in ED Medications  0.9 %  sodium chloride infusion ( Intravenous New Bag/Given 10/16/15  1012)  ondansetron (ZOFRAN) injection 4 mg (4 mg Intravenous Given 10/16/15 1013)  HYDROmorphone (DILAUDID) injection 1 mg (1 mg Intravenous Given 10/16/15 1014)  sodium chloride 0.9 % bolus 500 mL (0 mLs Intravenous Stopped 10/16/15 1059)     Initial Impression / Assessment and Plan / ED Course  I have reviewed the triage vital signs and the nursing notes.  Pertinent labs & imaging results that were available during my care of the patient were reviewed by me and considered in my medical decision making (see chart for details).  Clinical Course    Patient presented with several complaints. Which included headache pain to the left side of the head and left side of the body. Including left chest. No isolated the chest pain. Symptoms started yesterday. Associated with nausea but no vomiting. Patient states she supposed to be on high blood pressure medicines and last took him 2 months ago. Review of records with pharmacy shows that she has been on Norvasc since 2014. Workup here to include head CT troponin chest x-ray without any significant  findings. Patient was significant improvement with antinausea medicine and pain medicine. Blood pressure improved to 140/99. Will restart patient on Norvasc arrangements to follow wellness clinic. Also reviewed her hematology oncology records. She has follow-up with them sometime in October. Patient currently not receiving any active treatment for the breast cancer. Patient nontoxic no acute distress  In addition EKG without any acute findings. Not concerned about an acute cardiac event.  Final Clinical Impressions(s) / ED Diagnoses   Final diagnoses:  Essential hypertension  Atypical chest pain    New Prescriptions New Prescriptions   AMLODIPINE (NORVASC) 5 MG TABLET    Take 1 tablet (5 mg total) by mouth daily.     Fredia Sorrow, MD 10/16/15 1200

## 2015-10-16 NOTE — ED Notes (Signed)
Per Marylyn Ishihara w/ Pharmacy, last HTN medication filled was amlodipine 5mg  q day.  Sts it was last filled in 2014.

## 2015-10-16 NOTE — Discharge Instructions (Signed)
Return for any new or worse symptoms. Make an appointment to follow-up with the wellness clinic C of primary care doctor to recheck your blood pressure. Restart sure Norvasc blood pressure medicine. Work note provided.

## 2015-10-16 NOTE — ED Notes (Signed)
Delay in lab, RN starting IV

## 2015-11-13 ENCOUNTER — Other Ambulatory Visit: Payer: Self-pay

## 2015-11-13 DIAGNOSIS — C50412 Malignant neoplasm of upper-outer quadrant of left female breast: Secondary | ICD-10-CM

## 2015-11-16 ENCOUNTER — Other Ambulatory Visit: Payer: Self-pay

## 2015-11-16 ENCOUNTER — Encounter: Payer: Self-pay | Admitting: Oncology

## 2015-11-16 ENCOUNTER — Ambulatory Visit: Payer: Self-pay | Admitting: Oncology

## 2015-11-26 NOTE — Progress Notes (Signed)
CLINIC:  Survivorship   REASON FOR VISIT:  Routine follow-up for history of breast cancer.   BRIEF ONCOLOGIC HISTORY:  (From Sabrina Bussing, NP's last note on 11/10/14)    INTERVAL HISTORY:  Sabrina Holland presents to the [facility] today for routine follow-up for her history of breast cancer.    Physically, she is worried about her blood pressure. She recently went to the ER for high blood pressure; she was started on Norvasc at that time. She tells me she has headaches "all of the time." She does not have a primary care provider. 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  Socially, Sabrina Holland has been having a hard time.  She tells me she was recently laid off from her temporary job earlier this week. She now has no insurance as a result of losing her job.  She is concerned that her lights will be getting cut off soon at her home. She is requesting a letter from [place] to help prevent her electricity from being turned off.   From a breast cancer standpoint, she has not had a mammogram since 2014. She tells me her mammograms were previously funded by the [employer]. She tells me her last Pap smear was "a long time ago." She tells me she has some vaginal discharge at times. Her last menstrual cycle was 02/2015; she tells me she has been having a menstrual. About once per year for the past couple of years.    REVIEW OF SYSTEMS:  Review of Systems  Constitutional: Positive for unexpected weight change.  HENT:         Gum disease  Eyes:       Blurred vision  Respiratory:       Dyspnea on exertion  Cardiovascular:       Feet swelling  Gastrointestinal: Negative.   Endocrine: Positive for hot flashes.  Genitourinary: Positive for vaginal discharge.        Has not had a Pap smear in quite some time; endorses frequent urinary tract infections.  Musculoskeletal: Negative.   Skin: Negative.   Neurological: Positive for headaches.  Hematological:  Bruises/bleeds easily.  Psychiatric/Behavioral: Positive for decreased concentration and sleep disturbance. The patient is nervous/anxious.   Breast: Endorses left chest wall tenderness and right breast tenderness.    A 14-point review of systems was completed and was negative, except as noted above.    PAST MEDICAL/SURGICAL HISTORY:  Past Medical History:  Diagnosis Date  . Breast cancer ([place])   . Hypertension   . TIA (transient ischemic attack) 11/25/2011   "first time" (11/25/2011)   Past Surgical History:  Procedure Laterality Date  . BREAST BIOPSY  2007   left  . MASTECTOMY  09/2005   left  . TONSILLECTOMY AND ADENOIDECTOMY     "when I was little" (11/25/2011)  . TUBAL LIGATION  1993     ALLERGIES:  Allergies  Allergen Reactions  . Penicillins Other (See Comments)    "I don't know; childhood allergy" (11/25/2011) Has patient had a PCN reaction causing immediate rash, facial/tongue/throat swelling, SOB or lightheadedness with hypotension: Unknown Has patient had a PCN reaction causing severe rash involving mucus membranes or skin necrosis: Unknown Has patient had a PCN reaction that required hospitalization Unknown Has patient had a PCN reaction occurring within the last 10 years: No If all of the above answers are "NO", then may proceed with Cephalosporin use.      CURRENT MEDICATIONS:  Outpatient Encounter Prescriptions as  of 11/27/2015  Medication Sig  . amLODipine (NORVASC) 5 MG tablet Take 1 tablet (5 mg total) by mouth daily.  . Multiple Vitamin (MULTIVITAMIN WITH MINERALS) TABS tablet Take 1 tablet by mouth daily.  . naproxen sodium (ANAPROX) 220 MG tablet Take 440 mg by mouth 2 (two) times daily as needed (pain).   No facility-administered encounter medications on file as of 11/27/2015.      ONCOLOGIC FAMILY HISTORY:  Family History  Problem Relation Age of Onset  . Heart disease Mother   . Sickle cell anemia Cousin     maternal cousin    GENETIC  COUNSELING/TESTING: No records available for review.   SOCIAL HISTORY:  Sabrina Holland is single and lives alone in [place], [place].  She currently is not working.  She drinks a 40 oz of beer over 3 days.  She currently smokes 5-6 cigarettes per day.  She denies illicit drug use.    PHYSICAL EXAMINATION:  Vital Signs: Vitals:   11/27/15 0948  BP: (!) 148/93  Pulse: 99  Resp: 18  Temp: 98.3 F (36.8 C)   Filed Weights   11/27/15 0948  Weight: 131 lb 1.6 oz (59.5 kg)   General: Chronically-ill appearing female in no acute distress.  She is unaccompanied today.   HEENT: Head is normocephalic.  Pupils equal and reactive to light. Conjunctivae clear without exudate.  Sclerae anicteric. Oral mucosa is pink, moist.  Oropharynx is pink without lesions or erythema. Poor dentition.  Lymph: No cervical, supraclavicular, or infraclavicular lymphadenopathy noted on palpation.  Cardiovascular: Regular rate and rhythm.Marland Kitchen Respiratory: Clear to auscultation bilaterally. Chest expansion symmetric; breathing non-labored.  Breast Exam:  -Left chest wall: s/p mastectomy; tender to palpation; no appreciable nodularity or masses.  No skin redness, thickening, or peau d'orange appearance. Mastectomy scar well-healed without nodularity or erythema.   -Right breast: No appreciable masses on palpation. Tender to palpation throughout exam. No skin redness, thickening, or peau d'orange appearance; no nipple retraction or nipple discharge. -Axilla: No axillary adenopathy bilaterally.  GI: Abdomen soft and round; non-tender, non-distended. Bowel sounds normoactive. No hepatosplenomegaly.   GU: Deferred.  Neuro: No focal deficits. Steady gait.  Psych: Mood and affect normal and appropriate for situation.  Extremities: No edema. Skin: Warm and dry.  LABORATORY DATA:  None for this visit.   DIAGNOSTIC IMAGING:  Most recent mammogram: 10/16/2012    ASSESSMENT AND PLAN:  [name] is a pleasant 45 y.o.  female with history of Stage IIA left breast invasive ductal carcinoma, ER+/PR+/HER2-, diagnosed in 09/2006; treated with mastectomy &  adjuvant chemotherapy. She began anti-estrogen therapy in 01/2007 through 01/2011, which was switched to Letrozole (stopped d/t side effects); Tamoxifen restarted in 07/2011 through 11/2011, but was stopped after pt had TIA. No subsequent anti-estrogen therapy given.   She [facility] for surveillance and routine follow-up.    1. History of Stage IIA left breast cancer:  Sabrina Holland is currently clinically without evidence of disease or recurrence of breast cancer. She has not had a mammogram since 2014.  I will see if we can work with our [employer] team/Breast Cancer & Cervical Cancer Control Program (BCCCP) to see if they may be able to help get the patient enrolled in their program so she can get mammogram and pap smear.  We also discussed the option of having her "graduate" from follow-up here at the cancer center, since it has been 9 years since her breast cancer diagnosis.  I  shared with her that I would not feel comfortable "graduating" her unless she had a PCP in place who would be willing to assume the responsibility of her breast cancer surveillance.  Given that she does not currently have a PCP, we will arrange for her to come back to the cancer center in 1 year to see Survivorship NP.  I encouraged her to call us and let us know if she finds a PCP willing to do her breast exams/order her mammograms annually, and we could cancer her follow-up appt here.  She agreed with this plan.  I encouraged her to call me with any questions or concerns before her next visit at the cancer center, and I would be happy to see her sooner if needed.   2. Several health concerns/complaints unrelated to her history of breast cancer/Social concerns:  Sabrina Holland chief concern is her blood pressure and headaches.  Likely her elevated blood pressure is  contributing to her headaches. I consult to our oncology social worker, Loren Racer, LCSW, who came up to see the patient while she was here today for our visit. Sabrina Holland was given resources for PCP practices in the community that take patients without insurance. She was also encouraged to visit the [facility] online, and try to enroll in health insurance coverage as soon as she is able.  She understands the importance of finding a PCP, as well as getting health insurance coverage again.  I provided a letter for her today, stating that it is necessary that she have power and heat at her home, given that she is a cancer survivor.  3. Tobacco use disorder & Alcohol use: Sabrina Holland was counseled on the importance of smoking cessation for longer than 5 minutes.  She is currently smoking about 5-6 cigarettes per day; she does not smoke first thing in the morning after waking up (which is the biggest predictor of nicotine dependence).   I shared with her that since she does not have insurance, she is eligible to receive free nicotine patches from the [facility].  She tells me she has their number and will give them a call.  We discussed the health risks that both tobacco and alcohol use may have on her health. I advised her to cut down on both her drinking and cigarette use.        Dispo:  -Referral to BCCCP program for mammogram and pap smear.  -Social work referral for social concerns and to help with getting access to PCP.  -Return to cancer center to see Survivorship NP in 11/2016.    A total of 20 minutes of face-to-face time was spent with this patient with greater than 50% of that time in counseling and care-coordination.   Mike Craze, NP Survivorship Program [place] [phone]   Note: PRIMARY CARE PROVIDER No PCP Per Patient None None

## 2015-11-27 ENCOUNTER — Encounter: Payer: Self-pay | Admitting: Adult Health

## 2015-11-27 ENCOUNTER — Ambulatory Visit (HOSPITAL_BASED_OUTPATIENT_CLINIC_OR_DEPARTMENT_OTHER): Payer: Self-pay | Admitting: Adult Health

## 2015-11-27 ENCOUNTER — Encounter: Payer: Self-pay | Admitting: *Deleted

## 2015-11-27 VITALS — BP 148/93 | HR 99 | Temp 98.3°F | Resp 18 | Ht 65.0 in | Wt 131.1 lb

## 2015-11-27 DIAGNOSIS — E538 Deficiency of other specified B group vitamins: Secondary | ICD-10-CM

## 2015-11-27 DIAGNOSIS — C50412 Malignant neoplasm of upper-outer quadrant of left female breast: Secondary | ICD-10-CM

## 2015-11-27 DIAGNOSIS — Z170 Estrogen receptor positive status [ER+]: Secondary | ICD-10-CM

## 2015-11-27 DIAGNOSIS — Z720 Tobacco use: Secondary | ICD-10-CM

## 2015-11-27 NOTE — Progress Notes (Unsigned)
    November 28, 2015   To Whom It May Concern:   Sabrina Holland was seen at [facility] for her history of breast cancer.  It is important that she have electricity/heat to maintain her current health after undergoing treatments for cancer.    Please allow this letter to serve as medical necessity for power/electricity needs for this patient.  Please contact me with any questions.    Hulda Marin, [place] [phone]

## 2015-11-27 NOTE — Progress Notes (Signed)
[place] Work  Clinical Social Work was referred by Teaching laboratory technician for assessment of psychosocial needs due to financial resource concerns, lack of PCP and no insurance.  Clinical Social Worker met with patient at [facility] after her appt to review local resources to further assist pt. CSW provided lists of possible PCP options, resources for help with utility assistance, housing assistance and how to get assistance applying for ACA. Pt reports she has access to transportation through family friends and can get to these resources and computer access in order to explore further. CSW reviewed each resource in detail and problem solved with pt on how to receive further assistance in the community. Pt stated understanding and plans to follow up accordingly.     Clinical Social Work interventions: Resource assistance and referral  Loren Racer, [place] Worker [place]  [place] Phone: [phone] Fax: [phone]

## 2015-11-30 ENCOUNTER — Telehealth: Payer: Self-pay | Admitting: Adult Health

## 2015-11-30 NOTE — Telephone Encounter (Signed)
I received a call from Sabrina Holland, letting me know that she was able to get her electricity kept on, but is having trouble paying for her rent with the resources that were given to her by Sabrina Racer, LCSW last week.  She shared with me that her was not able to use the agency resource that Sabrina Holland spoke with her about because her apartment is in her daughter's name.    I let her know that I would forward her concerns back to [place].  I did let her know that Sabrina Holland is only in the office on Wednesday afternoons, Thursdays, and Fridays, so it may be a few days before she is able to return her call.  I also let Sabrina Holland know that often there are more resources available to cancer patients during treatment, but the financial resources a little more limited after the completion of treatment.    She voiced understanding & appreciation.  She tells me she is "short about $300 of my $500 rent that's due next week."    I will forward this message to Sabrina Holland and will ask that she give the patient a return call when she is able.    Mike Craze, NP [place] [phone]

## 2015-12-01 ENCOUNTER — Encounter: Payer: Self-pay | Admitting: *Deleted

## 2015-12-01 NOTE — Progress Notes (Signed)
[place] Work  Clinical Social Work was referred by nurse for assessment of psychosocial needs due to ongoing rent concerns.  Clinical Social Worker contacted patient at home via phone to offer support and assess for needs.  Pt reports rent is in her daughter's name, but her daughter has moved to [place]. CSW explained to pt that her name would have to be on lease in order to access rent assistance. Pt stated understanding and CSW apologized for limited options and policies per agencies stating such. CSW also referred pt to [facility] for additional resource assistance.    Clinical Social Work interventions:  Resource assistance and education  Loren Racer, [place] Worker [place]  [place] Phone: [phone] Fax: [phone]

## 2015-12-02 ENCOUNTER — Other Ambulatory Visit: Payer: Self-pay | Admitting: Obstetrics and Gynecology

## 2015-12-02 DIAGNOSIS — Z1231 Encounter for screening mammogram for malignant neoplasm of breast: Secondary | ICD-10-CM

## 2015-12-07 ENCOUNTER — Telehealth: Payer: Self-pay | Admitting: General Practice

## 2015-12-07 NOTE — Telephone Encounter (Signed)
Spoke with patient confirmed November 2018 appt.

## 2015-12-08 ENCOUNTER — Encounter: Payer: Self-pay | Admitting: Nurse Practitioner

## 2015-12-22 ENCOUNTER — Encounter (HOSPITAL_COMMUNITY): Payer: Self-pay

## 2015-12-22 ENCOUNTER — Ambulatory Visit (HOSPITAL_COMMUNITY)
Admission: RE | Admit: 2015-12-22 | Discharge: 2015-12-22 | Disposition: A | Discharge status: Home / Self Care | Payer: Self-pay | Source: Ambulatory Visit | Attending: Obstetrics and Gynecology | Admitting: Obstetrics and Gynecology

## 2015-12-22 ENCOUNTER — Other Ambulatory Visit (HOSPITAL_COMMUNITY): Payer: Self-pay | Admitting: *Deleted

## 2015-12-22 ENCOUNTER — Ambulatory Visit
Admission: RE | Admit: 2015-12-22 | Discharge: 2015-12-22 | Disposition: A | Discharge status: Home / Self Care | Payer: No Typology Code available for payment source | Source: Ambulatory Visit | Attending: Obstetrics and Gynecology | Admitting: Obstetrics and Gynecology

## 2015-12-22 VITALS — BP 140/84 | Temp 98.4°F | Ht 65.0 in | Wt 131.0 lb

## 2015-12-22 DIAGNOSIS — Z1231 Encounter for screening mammogram for malignant neoplasm of breast: Secondary | ICD-10-CM

## 2015-12-22 DIAGNOSIS — Z01419 Encounter for gynecological examination (general) (routine) without abnormal findings: Secondary | ICD-10-CM

## 2015-12-22 NOTE — Addendum Note (Signed)
Encounter addended by: Loletta Parish, RN on: 12/22/2015 11:02 AM<BR>    Actions taken: Sign clinical note

## 2015-12-22 NOTE — Addendum Note (Signed)
Encounter addended by: Armond Hang, LPN on: 624THL [id number] PM<BR>    Actions taken: Order list changed

## 2015-12-22 NOTE — Progress Notes (Signed)
Complaints of right breast tenderness and heaviness that patient states has had since her left breast mastectomy in 2008.  Pap Smear: Pap smear completed today. Last Pap smear was 05/31/2011 at [facility] and normal. Per patient has no history of an abnormal Pap smear. Last Pap smear result is in EPIC.  Physical exam: Breasts Patient has a history of a left breast mastectomy in 2008 for breast cancer. No skin abnormalities bilateral breasts. No nipple retraction right breast. No nipple discharge right breast. No lymphadenopathy. No lumps palpated bilateral breasts. No complaints of pain or tenderness on exam. Referred patient to the [facility] for a screening mammogram. Appointment scheduled for Tuesday, December 22, 2015 at 0910.  Pelvic/Bimanual   Ext Genitalia No lesions, no swelling and no discharge observed on external genitalia.         Vagina Vagina pink and normal texture. No lesions and yellowish colored frothy discharge observed with a odor. Wet prep completed.       Cervix Cervix is present. Cervix rough appearing and friable.Cervix tilted to the left. Yellowish colored frothy discharge observed with a odor on cervical os.     Uterus Uterus is present and palpable. Uterus positioned to the left and enlarged. No changes since exam 05/31/2011.    Adnexae Bilateral ovaries present and palpable. No tenderness on palpation.          Rectovaginal No rectal exam completed today since patient had no rectal complaints. No skin abnormalities observed on exam.    Smoking History: Patient is a current smoker. Discussed smoking cessation with patient. Referred patient to the [facility] Quitline and gave resources to free classes at [facility].   Patient Navigation: Patient education provided. Access to services provided for patient through [facility] program.   Colorectal Cancer Screening: Per patient had a colonoscopy completed in October 2010. No complaints today.

## 2015-12-22 NOTE — Patient Instructions (Addendum)
Explained breast self awareness to [employer]. Let patient know [facility]CP will cover Pap smears and HPV typing every 5 years unless has a history of abnormal Pap smears. Referred patient to the [facility] for a screening mammogram. Appointment scheduled for Tuesday, December 22, 2015 at 0910. Let patient know the [facility] Center will follow up with her within the next couple weeks with results of mammogram by letter or phone. Discussed smoking cessation with patient. Referred patient to the [facility] Quitline and gave resources to free classes at [facility]. [place]or verbalized understanding.  Teri Diltz, Arvil Chaco, RN 10:59 AM

## 2015-12-24 LAB — CYTOLOGY - PAP
DIAGNOSIS: NEGATIVE
HPV (WINDOPATH): NOT DETECTED

## 2015-12-25 ENCOUNTER — Encounter (HOSPITAL_COMMUNITY): Payer: Self-pay | Admitting: *Deleted

## 2015-12-28 ENCOUNTER — Telehealth (HOSPITAL_COMMUNITY): Payer: Self-pay | Admitting: *Deleted

## 2015-12-28 ENCOUNTER — Other Ambulatory Visit (HOSPITAL_COMMUNITY): Payer: Self-pay | Admitting: *Deleted

## 2015-12-28 DIAGNOSIS — B9689 Other specified bacterial agents as the cause of diseases classified elsewhere: Secondary | ICD-10-CM

## 2015-12-28 DIAGNOSIS — N760 Acute vaginitis: Principal | ICD-10-CM

## 2015-12-28 DIAGNOSIS — A599 Trichomoniasis, unspecified: Secondary | ICD-10-CM

## 2015-12-28 MED ORDER — METRONIDAZOLE 500 MG PO TABS: 500.0000 mg | ORAL_TABLET | Freq: Two times a day (BID) | ORAL | 0 refills | Status: DC

## 2015-12-28 NOTE — Telephone Encounter (Signed)
Telephoned patient at home number and discussed negative pap smear results. HPV was negative. Advised patient pap smear did show trichomoniasis and also wet prep showed bacterial vaginosis. Advised patient to complete all medication, no sexual contact while on medication, no alcohol, sexual partner also needs to be treated and to finish all medication. Medication was sent to the [facility]. Patient voiced understanding.

## 2016-09-26 ENCOUNTER — Telehealth: Payer: Self-pay | Admitting: *Deleted

## 2016-09-26 NOTE — Telephone Encounter (Signed)
Scheduled for 9/13 at 1 pm.

## 2016-09-26 NOTE — Telephone Encounter (Addendum)
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  My left side always hurts.  Due to my left breast cancer, this side has hurt for seven years.  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  My job needs something on file about my left breast and arm pain affect on my work.  I will get the fax number from work.  I moved this weekend.  My new address is [address] [place], [place]."  With this call, noted survivor F/U scheduled 11-25-2016 at 9:30 am with provider no longer with [facility] needs rescheduling.  Awaiting return call from patient with fax number.

## 2016-09-26 NOTE — Telephone Encounter (Signed)
Can you see if she can go ahead and come in so we can talk about and have a note that matches the issues?   Thanks,  Mendel Ryder

## 2016-09-29 ENCOUNTER — Ambulatory Visit: Payer: No Typology Code available for payment source | Admitting: Adult Health

## 2016-09-29 ENCOUNTER — Ambulatory Visit: Payer: Self-pay | Admitting: Adult Health

## 2016-09-29 NOTE — Progress Notes (Deleted)
CLINIC:  Survivorship   REASON FOR VISIT:  Routine follow-up for history of breast cancer.   BRIEF ONCOLOGIC HISTORY:   No history exists.     INTERVAL HISTORY:  Sabrina Holland presents to the Survivorship Clinic today for routine follow-up for her history of breast cancer.  Overall, she reports feeling quite well. ***    REVIEW OF SYSTEMS:  Review of Systems - Oncology Breast: Denies any new nodularity, masses, tenderness, nipple changes, or nipple discharge.       PAST MEDICAL/SURGICAL HISTORY:  Past Medical History:  Diagnosis Date  . Breast cancer ([place])   . Hypertension   . TIA (transient ischemic attack) 11/25/2011   "first time" (11/25/2011)   Past Surgical History:  Procedure Laterality Date  . BREAST BIOPSY  2007   left  . MASTECTOMY  09/2005   left  . TONSILLECTOMY AND ADENOIDECTOMY     "when I was little" (11/25/2011)  . TUBAL LIGATION  1993     ALLERGIES:  Allergies  Allergen Reactions  . Penicillins Other (See Comments)    "I don't know; childhood allergy" (11/25/2011) Has patient had a PCN reaction causing immediate rash, facial/tongue/throat swelling, SOB or lightheadedness with hypotension: Unknown Has patient had a PCN reaction causing severe rash involving mucus membranes or skin necrosis: Unknown Has patient had a PCN reaction that required hospitalization Unknown Has patient had a PCN reaction occurring within the last 10 years: No If all of the above answers are "NO", then may proceed with Cephalosporin use.      CURRENT MEDICATIONS:  Outpatient Encounter Prescriptions as of 09/29/2016  Medication Sig  . amLODipine (NORVASC) 5 MG tablet Take 1 tablet (5 mg total) by mouth daily.  . metroNIDAZOLE (FLAGYL) 500 MG tablet Take 1 tablet (500 mg total) by mouth 2 (two) times daily.  . Multiple Vitamin (MULTIVITAMIN WITH MINERALS) TABS tablet Take 1 tablet by mouth daily.  . naproxen sodium (ANAPROX) 220 MG tablet Take 440 mg by mouth 2 (two)  times daily as needed (pain).   No facility-administered encounter medications on file as of 09/29/2016.      ONCOLOGIC FAMILY HISTORY:  Family History  Problem Relation Age of Onset  . Heart disease Mother   . Sickle cell anemia Cousin        maternal cousin    GENETIC COUNSELING/TESTING: ***  SOCIAL HISTORY:  Sabrina Holland is /single/married/divorced/widowed/separated and lives alone/with her spouse/family/friend in (city), [place].  She has (#) children and they live in (city).  Ms. Ishii is currently retired/disabled/working part-time/full-time as ***.  She denies any current or history of tobacco, alcohol, or illicit drug use.     PHYSICAL EXAMINATION:  Vital Signs: There were no vitals filed for this visit. There were no vitals filed for this visit. General: Well-nourished, well-appearing female in no acute distress.  Unaccompanied/Accompanied by***** today.   HEENT: Head is normocephalic.  Pupils equal and reactive to light. Conjunctivae clear without exudate.  Sclerae anicteric. Oral mucosa is pink, moist.  Oropharynx is pink without lesions or erythema.  Lymph: No cervical, supraclavicular, or infraclavicular lymphadenopathy noted on palpation.  Cardiovascular: Regular rate and rhythm.Marland Kitchen Respiratory: Clear to auscultation bilaterally. Chest expansion symmetric; breathing non-labored.  Breast Exam:  -Left breast: No appreciable masses on palpation. No skin redness, thickening, or peau d'orange appearance; no nipple retraction or nipple discharge; mild distortion in symmetry at previous lumpectomy site***healed scar without erythema or nodularity.  -Right breast: No appreciable masses on palpation. No  skin redness, thickening, or peau d'orange appearance; no nipple retraction or nipple discharge; mild distortion in symmetry at previous lumpectomy site***healed scar without erythema or nodularity. -Axilla: No axillary adenopathy bilaterally.  GI: Abdomen soft  and round; non-tender, non-distended. Bowel sounds normoactive. No hepatosplenomegaly.   GU: Deferred.  Neuro: No focal deficits. Steady gait.  Psych: Mood and affect normal and appropriate for situation.  MSK: No focal spinal tenderness to palpation, full range of motion in bilateral upper extremities Extremities: No edema. Skin: Warm and dry.  LABORATORY DATA:  None for this visit***   DIAGNOSTIC IMAGING:  Most recent mammogram: ***    ASSESSMENT AND PLAN:  Ms.. Lamphear is a pleasant 46 y.o. female with history of Stage *** right/left breast invasive ductal carcinoma, ER+/PR+/HER2-, diagnosed in (date), treated with lumpectomy, adjuvant radiation therapy, and anti-estrogen therapy with *** beginning in (date).  She presents to the Survivorship Clinic for surveillance and routine follow-up.   1. History of breast cancer:  Ms. Pikus is currently clinically and radiographically without evidence of disease or recurrence of breast cancer. She will be due for mammogram in ***; orders placed today.  She will continue her anti-estrogen therapy with ***, with plans to continue for *** years.  She will return to the cancer center to see her medical oncologist, Dr. ***, in ***/2018.  I encouraged her to call me with any questions or concerns before her next visit at the cancer center, and I would be happy to see her sooner, if needed.    #. Problem(s) at Visit___________________.  #. Bone health:  Given Ms. Todisco's age, history of breast cancer, and her current anti-estrogen therapy with ________, she is at risk for bone demineralization. Her last DEXA scan was on **/**/20**.  In the meantime, she was encouraged to increase her consumption of foods rich in calcium, as well as increase her weight-bearing activities.  She was given education on specific food and activities to promote bone health.  #. Cancer screening:  Due to Ms. Tapscott's history and her age, she should receive screening for  skin cancers, colon cancer, and ***gynecologic cancers. She was encouraged to follow-up with her PCP for appropriate cancer screenings.   #. Health maintenance and wellness promotion: Ms. Escudero was encouraged to consume 5-7 servings of fruits and vegetables per day. She was also encouraged to engage in moderate to vigorous exercise for 30 minutes per day most days of the week. She was instructed to limit her alcohol consumption and continue to abstain from tobacco use/was encouraged stop smoking.  ***    Dispo:  -Return to cancer center ***   A total of (30) minutes of face-to-face time was spent with this patient with greater than 50% of that time in counseling and care-coordination.   Gardenia Phlegm, NP Survivorship Program [place] [phone]   Note: PRIMARY CARE PROVIDER Patient, No Pcp Per None None

## 2016-10-06 ENCOUNTER — Encounter (HOSPITAL_COMMUNITY): Payer: Self-pay

## 2016-11-24 ENCOUNTER — Encounter: Payer: Self-pay | Admitting: Adult Health

## 2016-11-24 NOTE — Progress Notes (Deleted)
CLINIC:  Survivorship   REASON FOR VISIT:  Routine follow-up for history of breast cancer.   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   (3) status-post adjuvant chemotherapy consisting of 4 dose-dense cycles of doxorubicin and cyclophosphamide.  (4)  on tamoxifen between January 2009 and January 2013, when she was switched to letrozole, stopped due to side effects. Tamoxifen resumed 07/18/2011, then discontinued in November 2013 after an apparent TIA.  Aromatase inhibitors were not resumed   INTERVAL HISTORY:  Ms. Doner presents to the [facility] today for routine follow-up for her history of breast cancer.  Overall, she reports feeling quite well. ***    REVIEW OF SYSTEMS:  Review of Systems - Oncology Breast: Denies any new nodularity, masses, tenderness, nipple changes, or nipple discharge.       PAST MEDICAL/SURGICAL HISTORY:  Past Medical History:  Diagnosis Date  . Breast cancer ([place])   . Breast cancer, stage 2 ([place]) 02/23/2011  . Hypertension   . TIA (transient ischemic attack) 11/25/2011   "first time" (11/25/2011)   Past Surgical History:  Procedure Laterality Date  . BREAST BIOPSY  2007   left  . MASTECTOMY  09/2005   left  . TONSILLECTOMY AND ADENOIDECTOMY     "when I was little" (11/25/2011)  . TUBAL LIGATION  1993     ALLERGIES:  Allergies  Allergen Reactions  . Penicillins Other (See Comments)    "I don't know; childhood allergy" (11/25/2011) Has patient had a PCN reaction causing immediate rash, facial/tongue/throat swelling, SOB or lightheadedness with hypotension: Unknown Has patient had a  PCN reaction causing severe rash involving mucus membranes or skin necrosis: Unknown Has patient had a PCN reaction that required hospitalization Unknown Has patient had a PCN reaction occurring within the last 10 years: No If all of the above answers are "NO", then may proceed with Cephalosporin use.      CURRENT MEDICATIONS:  Outpatient Encounter Medications as of 11/24/2016  Medication Sig  . amLODipine (NORVASC) 5 MG tablet Take 1 tablet (5 mg total) by mouth daily.  . metroNIDAZOLE (FLAGYL) 500 MG tablet Take 1 tablet (500 mg total) by mouth 2 (two) times daily.  . Multiple Vitamin (MULTIVITAMIN WITH MINERALS) TABS tablet Take 1 tablet by mouth daily.  . naproxen sodium (ANAPROX) 220 MG tablet Take 440 mg by mouth 2 (two) times daily as needed (pain).   No facility-administered encounter medications on file as of 11/24/2016.      ONCOLOGIC FAMILY HISTORY:  Family History  Problem Relation Age of Onset  . Heart disease Mother   . Sickle cell anemia Cousin        maternal cousin    GENETIC COUNSELING/TESTING: ***  SOCIAL HISTORY:  Gianni Fuchs is /single/married/divorced/widowed/separated and lives alone/with her spouse/family/friend in (city), [place].  She has (#) children and they live in (city).  Ms. Orama is currently retired/disabled/working part-time/full-time as ***.  She denies any current or history of tobacco, alcohol, or illicit drug use.     PHYSICAL EXAMINATION:  Vital Signs: There were no vitals filed for this visit. There were no vitals filed for this visit. General: Well-nourished, well-appearing female  in no acute distress.  Unaccompanied/Accompanied by***** today.   HEENT: Head is normocephalic.  Pupils equal and reactive to light. Conjunctivae clear without exudate.  Sclerae anicteric. Oral mucosa is pink, moist.  Oropharynx is pink without lesions or erythema.  Lymph: No cervical, supraclavicular, or infraclavicular lymphadenopathy  noted on palpation.  Cardiovascular: Regular rate and rhythm.Marland Kitchen Respiratory: Clear to auscultation bilaterally. Chest expansion symmetric; breathing non-labored.  Breast Exam:  -Left breast: No appreciable masses on palpation. No skin redness, thickening, or peau d'orange appearance; no nipple retraction or nipple discharge; mild distortion in symmetry at previous lumpectomy site***healed scar without erythema or nodularity.  -Right breast: No appreciable masses on palpation. No skin redness, thickening, or peau d'orange appearance; no nipple retraction or nipple discharge; mild distortion in symmetry at previous lumpectomy site***healed scar without erythema or nodularity. -Axilla: No axillary adenopathy bilaterally.  GI: Abdomen soft and round; non-tender, non-distended. Bowel sounds normoactive. No hepatosplenomegaly.   GU: Deferred.  Neuro: No focal deficits. Steady gait.  Psych: Mood and affect normal and appropriate for situation.  MSK: No focal spinal tenderness to palpation, full range of motion in bilateral upper extremities Extremities: No edema. Skin: Warm and dry.  LABORATORY DATA:  None for this visit***   DIAGNOSTIC IMAGING:  Most recent mammogram:     ASSESSMENT AND PLAN:  Ms.. Vazquez is a pleasant 46 y.o. female with history of Stage IIA left breast invasive ductal carcinoma, ER+/PR+/HER2-, diagnosed in 09/2006, treated with mastectomy, adjuvant chemotherapy, and roughly 4.5 years of anti estrogen therapy with Tamoxifen completing in 11/2011.  She presents to the Survivorship Clinic for surveillance and routine follow-up.   1. History of breast cancer:  Ms. Belmares is currently clinically and radiographically without evidence of disease or recurrence of breast cancer. She will be due for mammogram in ***; orders placed today.  She will continue her anti-estrogen therapy with ***, with plans to continue for *** years.  She will return to the cancer center to see her medical  oncologist, Dr. ***, in ***/2018.  I encouraged her to call me with any questions or concerns before her next visit at the cancer center, and I would be happy to see her sooner, if needed.    #. Problem(s) at Visit___________________.  #. Bone health:  Given Ms. Donna's history of breast cancer, she is at slight risk for bone demineralization.  She was given education on specific food and activities to promote bone health.  #. Cancer screening:  Due to Ms. Laskaris's history and her age, she should receive screening for skin cancers, colon cancer, and gynecologic cancers.  She  She was encouraged to follow-up with her PCP for appropriate cancer screenings.   #. Health maintenance and wellness promotion: Ms. Monsour was encouraged to consume 5-7 servings of fruits and vegetables per day. She was also encouraged to engage in moderate to vigorous exercise for 30 minutes per day most days of the week. She was instructed to limit her alcohol consumption and continue to abstain from tobacco use/was encouraged stop smoking.  ***    Dispo:  -Return to cancer center ***   A total of (30) minutes of face-to-face time was spent with this patient with greater than 50% of that time in counseling and care-coordination.   Gardenia Phlegm, NP Survivorship Program [place] [phone]   Note: PRIMARY CARE PROVIDER Patient, No Pcp Per None None

## 2016-11-25 ENCOUNTER — Encounter: Payer: Self-pay | Admitting: Adult Health

## 2016-11-28 ENCOUNTER — Encounter: Payer: Self-pay | Admitting: Adult Health

## 2016-11-28 ENCOUNTER — Ambulatory Visit (HOSPITAL_BASED_OUTPATIENT_CLINIC_OR_DEPARTMENT_OTHER): Payer: Self-pay | Admitting: Adult Health

## 2016-11-28 ENCOUNTER — Other Ambulatory Visit: Payer: Self-pay | Admitting: Adult Health

## 2016-11-28 ENCOUNTER — Ambulatory Visit (HOSPITAL_BASED_OUTPATIENT_CLINIC_OR_DEPARTMENT_OTHER): Payer: Self-pay

## 2016-11-28 VITALS — BP 167/99 | HR 109 | Temp 98.0°F | Resp 20 | Ht 65.0 in | Wt 128.0 lb

## 2016-11-28 DIAGNOSIS — Z9221 Personal history of antineoplastic chemotherapy: Secondary | ICD-10-CM

## 2016-11-28 DIAGNOSIS — C50412 Malignant neoplasm of upper-outer quadrant of left female breast: Secondary | ICD-10-CM

## 2016-11-28 DIAGNOSIS — Z170 Estrogen receptor positive status [ER+]: Secondary | ICD-10-CM

## 2016-11-28 DIAGNOSIS — Z9012 Acquired absence of left breast and nipple: Secondary | ICD-10-CM

## 2016-11-28 DIAGNOSIS — R140 Abdominal distension (gaseous): Secondary | ICD-10-CM

## 2016-11-28 DIAGNOSIS — Z853 Personal history of malignant neoplasm of breast: Secondary | ICD-10-CM

## 2016-11-28 DIAGNOSIS — Z8673 Personal history of transient ischemic attack (TIA), and cerebral infarction without residual deficits: Secondary | ICD-10-CM

## 2016-11-28 DIAGNOSIS — I10 Essential (primary) hypertension: Secondary | ICD-10-CM

## 2016-11-28 LAB — CBC WITH DIFFERENTIAL/PLATELET
BASO%: 0.7 % (ref 0.0–2.0)
Basophils Absolute: 0.1 10*3/uL (ref 0.0–0.1)
EOS%: 0.6 % (ref 0.0–7.0)
Eosinophils Absolute: 0 10*3/uL (ref 0.0–0.5)
HCT: 37.9 % (ref 34.8–46.6)
HGB: 12.5 g/dL (ref 11.6–15.9)
LYMPH%: 50.7 % — AB (ref 14.0–49.7)
MCH: 28.2 pg (ref 25.1–34.0)
MCHC: 33 g/dL (ref 31.5–36.0)
MCV: 85.4 fL (ref 79.5–101.0)
MONO#: 0.8 10*3/uL (ref 0.1–0.9)
MONO%: 11.3 % (ref 0.0–14.0)
NEUT%: 36.7 % — AB (ref 38.4–76.8)
NEUTROS ABS: 2.5 10*3/uL (ref 1.5–6.5)
PLATELETS: 347 10*3/uL (ref 145–400)
RBC: 4.44 10*6/uL (ref 3.70–5.45)
RDW: 14.4 % (ref 11.2–14.5)
WBC: 6.7 10*3/uL (ref 3.9–10.3)
lymph#: 3.4 10*3/uL — ABNORMAL HIGH (ref 0.9–3.3)

## 2016-11-28 LAB — COMPREHENSIVE METABOLIC PANEL
ALBUMIN: 4 g/dL (ref 3.5–5.0)
ALK PHOS: 74 U/L (ref 40–150)
ALT: 36 U/L (ref 0–55)
AST: 49 U/L — ABNORMAL HIGH (ref 5–34)
Anion Gap: 9 mEq/L (ref 3–11)
BILIRUBIN TOTAL: 0.48 mg/dL (ref 0.20–1.20)
BUN: 13.1 mg/dL (ref 7.0–26.0)
CALCIUM: 9.8 mg/dL (ref 8.4–10.4)
CO2: 27 mEq/L (ref 22–29)
CREATININE: 1 mg/dL (ref 0.6–1.1)
Chloride: 103 mEq/L (ref 98–109)
EGFR: >60 mL/min/{1.73_m2} (ref >60)
GLUCOSE: 91 mg/dL (ref 70–140)
Potassium: 4.3 mEq/L (ref 3.5–5.1)
SODIUM: 140 meq/L (ref 136–145)
TOTAL PROTEIN: 8.1 g/dL (ref 6.4–8.3)

## 2016-11-28 NOTE — Progress Notes (Signed)
CLINIC:  Survivorship   REASON FOR VISIT:  Routine follow-up for history of breast cancer.   BRIEF ONCOLOGIC HISTORY:  Per Sabrina Holland's note from 11/10/2014  1.  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  Oncotype recurrence score of 32, predicting a 25% risk of recurrence within 10 years if all she did was take tamoxifen for 5 years.   3.  status-post adjuvant chemotherapy consisting of 4 dose-dense cycles of doxorubicin and cyclophosphamide.  4.  on tamoxifen between January 2009 and January 2013, when she was switched to letrozole, stopped due to side effects. Tamoxifen resumed 07/18/2011, then discontinued in November 2013 after an apparent TIA.  Aromatase inhibitors were not resumed  5.  B-12 deficiency    INTERVAL HISTORY:  Sabrina Holland presents to the [facility] today for routine follow-up for her history of breast cancer.  Overall, she reports feeling moderately well.  Her blood pressure is elevated today.  She tells me she knows it is elevated, but she cannot afford insurance and cannot see a PCP.  She has a 4 month h/o abdominal bloating and swelling and wonders if she can't have an ultrasound or something to look into it.      REVIEW OF SYSTEMS:  Review of Systems  Constitutional: Negative for appetite change, chills, fatigue, fever and unexpected weight change.  HENT:   Negative for hearing loss and lump/mass.   Eyes: Negative for eye problems and icterus.  Respiratory: Negative for chest tightness, cough and shortness of breath.   Cardiovascular: Negative for chest pain, leg swelling and palpitations.  Gastrointestinal: Positive for abdominal distention. Negative for abdominal pain, blood in stool, constipation, diarrhea, nausea and vomiting.  Endocrine: Negative for  hot flashes.  Genitourinary: Negative for difficulty urinating and dyspareunia.   Musculoskeletal: Negative for arthralgias.  Skin: Negative for itching and rash.  Neurological: Negative for dizziness, extremity weakness and headaches.  Hematological: Negative for adenopathy. Does not bruise/bleed easily.  Psychiatric/Behavioral: Negative for depression. The patient is not nervous/anxious.   Breast: Denies any new nodularity, masses, tenderness, nipple changes, or nipple discharge.    PAST MEDICAL/SURGICAL HISTORY:  Past Medical History:  Diagnosis Date  . Breast cancer ([place])   . Breast cancer, stage 2 ([place]) 02/23/2011  . Hypertension   . TIA (transient ischemic attack) 11/25/2011   "first time" (11/25/2011)   Past Surgical History:  Procedure Laterality Date  . BREAST BIOPSY  2007   left  . MASTECTOMY  09/2005   left  . TONSILLECTOMY AND ADENOIDECTOMY     "when I was little" (11/25/2011)  . TUBAL LIGATION  1993     ALLERGIES:  Allergies  Allergen Reactions  . Penicillins Other (See Comments)    "I don't know; childhood allergy" (11/25/2011) Has patient had a PCN reaction causing immediate rash, facial/tongue/throat swelling, SOB or lightheadedness with hypotension: Unknown Has patient had a PCN reaction causing severe rash involving mucus membranes or skin necrosis: Unknown Has patient had a PCN reaction that required hospitalization Unknown Has patient had a PCN reaction occurring within the last 10 years: No If all of the above answers are "NO", then may proceed with Cephalosporin use.      CURRENT MEDICATIONS:  Outpatient Encounter Medications as of 11/28/2016  Medication Sig  . amLODipine (NORVASC) 5 MG tablet Take 1 tablet (5 mg total)  by mouth daily. (Patient not taking: Reported on 11/28/2016)  . metroNIDAZOLE (FLAGYL) 500 MG tablet Take 1 tablet (500 mg total) by mouth 2 (two) times daily. (Patient not taking: Reported on 11/28/2016)  . Multiple Vitamin  (MULTIVITAMIN WITH MINERALS) TABS tablet Take 1 tablet by mouth daily.  . naproxen sodium (ANAPROX) 220 MG tablet Take 440 mg by mouth 2 (two) times daily as needed (pain).   No facility-administered encounter medications on file as of 11/28/2016.      ONCOLOGIC FAMILY HISTORY:  Family History  Problem Relation Age of Onset  . Heart disease Mother   . Sickle cell anemia Cousin        maternal cousin     SOCIAL HISTORY:  Sabrina Holland is single and lives with a friend in [place], [place].  Sabrina Holland is currently working full time at [employer].  She denies any current or history of tobacco, alcohol, or illicit drug use.     PHYSICAL EXAMINATION:  Vital Signs: Vitals:   11/28/16 1149  BP: (!) 167/99  Pulse: (!) 109  Resp: 20  Temp: 98 F (36.7 C)  SpO2: 100%   Filed Weights   11/28/16 1149  Weight: 128 lb (58.1 kg)   General: Well-nourished, well-appearing female in no acute distress.  Unaccompanied today.   HEENT: Head is normocephalic.  Pupils equal and reactive to light. Conjunctivae clear without exudate.  Sclerae anicteric. Oral mucosa is pink, moist.  Oropharynx is pink without lesions or erythema.  Lymph: No cervical, supraclavicular, or infraclavicular lymphadenopathy noted on palpation.  Cardiovascular: Regular rate and rhythm. Respiratory: Clear to auscultation bilaterally. Chest expansion symmetric; breathing non-labored.  Breast Exam:  -Left breast: surgically absent, no nodules masses palpated -Right breast: No appreciable masses on palpation. No skin redness, thickening, or peau d'orange appearance; no nipple retraction or nipple discharge; -Axilla: No axillary adenopathy bilaterally.  GI: Abdomen soft and round; non-tender, + distention. Bowel sounds normoactive. No hepatosplenomegaly.   GU: Deferred.  Neuro: No focal deficits. Steady gait.  Psych: Mood and affect normal and appropriate for situation.  MSK: No focal spinal tenderness to  palpation, full range of motion in bilateral upper extremities Extremities: No edema. Skin: Warm and dry.  LABORATORY DATA:  None for this visit Appointment on 11/28/2016  Component Date Value Ref Range Status  . Sodium 11/28/2016 140  136 - 145 mEq/L Final  . Potassium 11/28/2016 4.3  3.5 - 5.1 mEq/L Final  . Chloride 11/28/2016 103  98 - 109 mEq/L Final  . CO2 11/28/2016 27  22 - 29 mEq/L Final  . Glucose 11/28/2016 91  70 - 140 mg/dl Final   Glucose reference range is for nonfasting patients. Fasting glucose reference range is 70- 100.  Marland Kitchen BUN 11/28/2016 13.1  7.0 - 26.0 mg/dL Final  . Creatinine 11/28/2016 1.0  0.6 - 1.1 mg/dL Final  . Total Bilirubin 11/28/2016 0.48  0.20 - 1.20 mg/dL Final  . Alkaline Phosphatase 11/28/2016 74  40 - 150 U/L Final  . AST 11/28/2016 49* 5 - 34 U/L Final  . ALT 11/28/2016 36  0 - 55 U/L Final  . Total Protein 11/28/2016 8.1  6.4 - 8.3 g/dL Final  . Albumin 11/28/2016 4.0  3.5 - 5.0 g/dL Final  . Calcium 11/28/2016 9.8  8.4 - 10.4 mg/dL Final  . Anion Gap 11/28/2016 9  3 - 11 mEq/L Final  . EGFR 11/28/2016 >60  >60 ml/min/1.73 m2 Final   eGFR is calculated using  the CKD-EPI Creatinine Equation (2009)  . WBC 11/28/2016 6.7  3.9 - 10.3 10e3/uL Final  . NEUT# 11/28/2016 2.5  1.5 - 6.5 10e3/uL Final  . HGB 11/28/2016 12.5  11.6 - 15.9 g/dL Final  . HCT 11/28/2016 37.9  34.8 - 46.6 % Final  . Platelets 11/28/2016 347  145 - 400 10e3/uL Final  . MCV 11/28/2016 85.4  79.5 - 101.0 fL Final  . MCH 11/28/2016 28.2  25.1 - 34.0 pg Final  . MCHC 11/28/2016 33.0  31.5 - 36.0 g/dL Final  . RBC 11/28/2016 4.44  3.70 - 5.45 10e6/uL Final  . RDW 11/28/2016 14.4  11.2 - 14.5 % Final  . lymph# 11/28/2016 3.4* 0.9 - 3.3 10e3/uL Final  . MONO# 11/28/2016 0.8  0.1 - 0.9 10e3/uL Final  . Eosinophils Absolute 11/28/2016 0.0  0.0 - 0.5 10e3/uL Final  . Basophils Absolute 11/28/2016 0.1  0.0 - 0.1 10e3/uL Final  . NEUT% 11/28/2016 36.7* 38.4 - 76.8 % Final  . LYMPH%  11/28/2016 50.7* 14.0 - 49.7 % Final  . MONO% 11/28/2016 11.3  0.0 - 14.0 % Final  . EOS% 11/28/2016 0.6  0.0 - 7.0 % Final  . BASO% 11/28/2016 0.7  0.0 - 2.0 % Final     DIAGNOSTIC IMAGING:  Most recent mammogram:     ASSESSMENT AND PLAN:  Sabrina Holland is a pleasant 46 y.o. female with history of Stage IIA left breast invasive ductal carcinoma, ER+/PR+/HER2-, diagnosed in 2008, treated with mastectomy, adjuvant chemotherapy, and anti-estrogen therapy with Tamoxifen x 4.5 years completed in 11/2011.  She presents to the [facility] for surveillance and routine follow-up.   1. History of breast cancer:  Sabrina Holland is currently clinically and radiographically without evidence of disease or recurrence of breast cancer. She will be due for mammogram in 12/2016; I also reached out to [facility] in [place] to see what could be done for her right breast screening mammo as well.  I encouraged her to call me with any questions or concerns before her next visit at the cancer center, and I would be happy to see her sooner, if needed.    2. Comorbidities/lack of insurance: The patient has many issues including HTN, h/o TIAs and does not have insurance.  She needs to be on medication for her hypertension and I reviewed this with her.  I reached out to [place] our Education officer, museum to look into resources for the patient.    3. Cancer screening:  Due to Sabrina Holland's history and her age, she should receive screening for skin cancers, colon cancer, and gynecologic cancers. She was encouraged to follow-up with her PCP for appropriate cancer screenings.   4. Health maintenance and wellness promotion: Sabrina Holland was encouraged to consume 5-7 servings of fruits and vegetables per day. She was also encouraged to engage in moderate to vigorous exercise for 30 minutes per day most days of the week. She was instructed to limit her alcohol consumption and continue to abstain from tobacco use.    5. Abdominal  distention: labs and abdominal ultrasound ordered today for evaluation.    Dispo:  -Return to cancer center in one year for LTS follow up -Mammogram due in 12/2016   A total of (30) minutes of face-to-face time was spent with this patient with greater than 50% of that time in counseling and care-coordination.   Gardenia Phlegm, NP Survivorship [facility] [place] [phone]   Note: PRIMARY CARE PROVIDER Patient, No Pcp  Per None None

## 2016-11-29 ENCOUNTER — Encounter: Payer: Self-pay | Admitting: *Deleted

## 2016-11-29 NOTE — Progress Notes (Signed)
[place] [employer] representative received referral from APP for assistance with identifying a PCP for patient who is uninsured.  CSW contacted [facility].  They provided contact information for scheduling an appointment.  They also provided information on additional clinics that could serve uninsured patients. ([facility]).  CSW shared this information with APP, and APP will contact patient.    Johnnye Lana, MSW, LCSW, OSW-C Clinical Social Worker [facility] [phone]

## 2016-11-30 ENCOUNTER — Encounter (HOSPITAL_COMMUNITY): Payer: Self-pay | Admitting: *Deleted

## 2016-11-30 ENCOUNTER — Telehealth: Payer: Self-pay | Admitting: Adult Health

## 2016-11-30 NOTE — Telephone Encounter (Signed)
Per 11/12 los already done

## 2016-12-05 ENCOUNTER — Ambulatory Visit (HOSPITAL_COMMUNITY): Payer: No Typology Code available for payment source

## 2016-12-05 ENCOUNTER — Other Ambulatory Visit: Payer: Self-pay | Admitting: Obstetrics and Gynecology

## 2016-12-05 DIAGNOSIS — Z1231 Encounter for screening mammogram for malignant neoplasm of breast: Secondary | ICD-10-CM

## 2016-12-12 ENCOUNTER — Ambulatory Visit (HOSPITAL_COMMUNITY)
Admission: RE | Admit: 2016-12-12 | Discharge: 2016-12-12 | Disposition: A | Discharge status: Home / Self Care | Payer: No Typology Code available for payment source | Source: Ambulatory Visit | Attending: Adult Health | Admitting: Adult Health

## 2016-12-12 ENCOUNTER — Telehealth: Payer: Self-pay

## 2016-12-12 ENCOUNTER — Encounter (HOSPITAL_COMMUNITY): Payer: Self-pay

## 2016-12-12 DIAGNOSIS — R140 Abdominal distension (gaseous): Secondary | ICD-10-CM | POA: Insufficient documentation

## 2016-12-12 DIAGNOSIS — C50412 Malignant neoplasm of upper-outer quadrant of left female breast: Secondary | ICD-10-CM | POA: Insufficient documentation

## 2016-12-12 DIAGNOSIS — Z170 Estrogen receptor positive status [ER+]: Secondary | ICD-10-CM | POA: Insufficient documentation

## 2016-12-12 NOTE — Telephone Encounter (Signed)
Attempts made to contact patient via phone with no answer.  Left VM on her cell phone with normal ultrasound results.  Advised patient to call center if she has further issues or concerns and left center phone number.

## 2016-12-12 NOTE — Telephone Encounter (Signed)
-----   Message from Gardenia Phlegm, NP sent at 12/12/2016 10:39 AM EST ----- Please call patient and let her know about her normal ultrasound ----- Message ----- From: Interface, Rad Results In Sent: 12/12/2016  10:34 AM To: Gardenia Phlegm, NP

## 2017-01-12 ENCOUNTER — Ambulatory Visit (HOSPITAL_COMMUNITY): Payer: No Typology Code available for payment source

## 2017-04-20 ENCOUNTER — Emergency Department (HOSPITAL_COMMUNITY): Payer: No Typology Code available for payment source

## 2017-04-20 ENCOUNTER — Encounter (HOSPITAL_COMMUNITY): Payer: Self-pay | Admitting: *Deleted

## 2017-04-20 ENCOUNTER — Emergency Department (HOSPITAL_COMMUNITY)
Admission: EM | Admit: 2017-04-20 | Discharge: 2017-04-20 | Disposition: A | Discharge status: Home / Self Care | Payer: No Typology Code available for payment source | Attending: Emergency Medicine | Admitting: Emergency Medicine

## 2017-04-20 DIAGNOSIS — Z720 Tobacco use: Secondary | ICD-10-CM

## 2017-04-20 DIAGNOSIS — Z3202 Encounter for pregnancy test, result negative: Secondary | ICD-10-CM | POA: Insufficient documentation

## 2017-04-20 DIAGNOSIS — Z79899 Other long term (current) drug therapy: Secondary | ICD-10-CM | POA: Insufficient documentation

## 2017-04-20 DIAGNOSIS — F17210 Nicotine dependence, cigarettes, uncomplicated: Secondary | ICD-10-CM | POA: Insufficient documentation

## 2017-04-20 DIAGNOSIS — J181 Lobar pneumonia, unspecified organism: Secondary | ICD-10-CM | POA: Insufficient documentation

## 2017-04-20 DIAGNOSIS — Z8673 Personal history of transient ischemic attack (TIA), and cerebral infarction without residual deficits: Secondary | ICD-10-CM | POA: Insufficient documentation

## 2017-04-20 DIAGNOSIS — I10 Essential (primary) hypertension: Secondary | ICD-10-CM | POA: Insufficient documentation

## 2017-04-20 DIAGNOSIS — J189 Pneumonia, unspecified organism: Secondary | ICD-10-CM

## 2017-04-20 DIAGNOSIS — Z853 Personal history of malignant neoplasm of breast: Secondary | ICD-10-CM | POA: Insufficient documentation

## 2017-04-20 LAB — CBC WITH DIFFERENTIAL/PLATELET
BASOS ABS: 0 10*3/uL (ref 0.0–0.1)
BASOS PCT: 0 %
Eosinophils Absolute: 0 10*3/uL (ref 0.0–0.7)
Eosinophils Relative: 0 %
HEMATOCRIT: 39 % (ref 36.0–46.0)
HEMOGLOBIN: 13.1 g/dL (ref 12.0–15.0)
LYMPHS PCT: 18 %
Lymphs Abs: 1.4 10*3/uL (ref 0.7–4.0)
MCH: 28.7 pg (ref 26.0–34.0)
MCHC: 33.6 g/dL (ref 30.0–36.0)
MCV: 85.5 fL (ref 78.0–100.0)
MONO ABS: 0.7 10*3/uL (ref 0.1–1.0)
Monocytes Relative: 8 %
NEUTROS ABS: 5.6 10*3/uL (ref 1.7–7.7)
NEUTROS PCT: 74 %
Platelets: 384 10*3/uL (ref 150–400)
RBC: 4.56 MIL/uL (ref 3.87–5.11)
RDW: 14.8 % (ref 11.5–15.5)
WBC: 7.7 10*3/uL (ref 4.0–10.5)

## 2017-04-20 LAB — URINALYSIS, ROUTINE W REFLEX MICROSCOPIC
Bilirubin Urine: NEGATIVE
GLUCOSE, UA: NEGATIVE mg/dL
Hgb urine dipstick: NEGATIVE
KETONES UR: NEGATIVE mg/dL
Leukocytes, UA: NEGATIVE
NITRITE: NEGATIVE
PH: 5 (ref 5.0–8.0)
Protein, ur: NEGATIVE mg/dL
SPECIFIC GRAVITY, URINE: 1.02 (ref 1.005–1.030)

## 2017-04-20 LAB — COMPREHENSIVE METABOLIC PANEL
ALBUMIN: 4.5 g/dL (ref 3.5–5.0)
ALT: 26 U/L (ref 14–54)
ANION GAP: 11 (ref 5–15)
AST: 31 U/L (ref 15–41)
Alkaline Phosphatase: 84 U/L (ref 38–126)
BILIRUBIN TOTAL: 0.8 mg/dL (ref 0.3–1.2)
BUN: 10 mg/dL (ref 6–20)
CALCIUM: 9.8 mg/dL (ref 8.9–10.3)
CO2: 24 mmol/L (ref 22–32)
Chloride: 101 mmol/L (ref 101–111)
Creatinine, Ser: 0.96 mg/dL (ref 0.44–1.00)
GFR calc non Af Amer: >60 mL/min (ref >60)
GLUCOSE: 119 mg/dL — AB (ref 65–99)
POTASSIUM: 4 mmol/L (ref 3.5–5.1)
Sodium: 136 mmol/L (ref 135–145)
TOTAL PROTEIN: 8.3 g/dL — AB (ref 6.5–8.1)

## 2017-04-20 LAB — INFLUENZA PANEL BY PCR (TYPE A & B)
INFLAPCR: NEGATIVE
INFLBPCR: NEGATIVE

## 2017-04-20 LAB — PREGNANCY, URINE: Preg Test, Ur: NEGATIVE

## 2017-04-20 MED ORDER — SODIUM CHLORIDE 0.9 % IV BOLUS
1000.0000 mL | Freq: Once | INTRAVENOUS | Status: AC
Start: 2017-04-20 — End: 2017-04-20
  Administered 2017-04-20: 1000 mL via INTRAVENOUS

## 2017-04-20 MED ORDER — AEROCHAMBER PLUS FLO-VU MEDIUM MISC
1.0000 | Freq: Once | Status: AC
  Administered 2017-04-20: 1
  Filled 2017-04-20: qty 1

## 2017-04-20 MED ORDER — AZITHROMYCIN 250 MG PO TABS: ORAL_TABLET | ORAL | 0 refills | Status: DC

## 2017-04-20 MED ORDER — ALBUTEROL SULFATE HFA 108 (90 BASE) MCG/ACT IN AERS
1.0000 | INHALATION_SPRAY | RESPIRATORY_TRACT | Status: DC | PRN
  Administered 2017-04-20: 1 via RESPIRATORY_TRACT
  Filled 2017-04-20: qty 6.7

## 2017-04-20 MED ORDER — KETOROLAC TROMETHAMINE 30 MG/ML IJ SOLN
30.0000 mg | Freq: Once | INTRAMUSCULAR | Status: AC
  Administered 2017-04-20: 30 mg via INTRAVENOUS
  Filled 2017-04-20: qty 1

## 2017-04-20 MED ORDER — ONDANSETRON HCL 4 MG/2ML IJ SOLN
4.0000 mg | Freq: Once | INTRAMUSCULAR | Status: AC
  Administered 2017-04-20: 4 mg via INTRAVENOUS
  Filled 2017-04-20: qty 2

## 2017-04-20 MED ORDER — SODIUM CHLORIDE 0.9 % IV SOLN
1.0000 g | Freq: Once | INTRAVENOUS | Status: AC
  Administered 2017-04-20: 1 g via INTRAVENOUS
  Filled 2017-04-20: qty 10

## 2017-04-20 MED ORDER — IBUPROFEN 600 MG PO TABS: 600.0000 mg | ORAL_TABLET | Freq: Four times a day (QID) | ORAL | 0 refills | Status: DC | PRN

## 2017-04-20 MED ORDER — AMLODIPINE BESYLATE 5 MG PO TABS: 5.0000 mg | ORAL_TABLET | Freq: Every day | ORAL | 2 refills | Status: DC

## 2017-04-20 MED ORDER — AZITHROMYCIN 250 MG PO TABS
500.0000 mg | ORAL_TABLET | Freq: Once | ORAL | Status: AC
  Administered 2017-04-20: 500 mg via ORAL
  Filled 2017-04-20: qty 2

## 2017-04-20 NOTE — ED Notes (Signed)
Pt stated that she has not been able to take her BP medication BC she does not have a primary MD at this time. That is why her BP is high. She cannot get her prescription refilled.

## 2017-04-20 NOTE — ED Notes (Signed)
Patient transported to X-ray 

## 2017-04-20 NOTE — Discharge Instructions (Signed)
Stop smoking

## 2017-04-20 NOTE — ED Provider Notes (Signed)
[facility] DEPT Provider Note   CSN: [id number] Arrival date & time: 04/20/17  4332     History   Chief Complaint Chief Complaint  Patient presents with  . Generalized Body Aches  . URI  . Nausea    HPI Sabrina Holland is a 47 y.o. female.  Pt presents to the ED today with body aches.  Pt said she's had cough, congestion, aches, diarrhea, fever for the past 2 days.  She has been taking otc cold medication for sx, but it has not been helping.  She smokes, but has been unable to smoke for the past 2 days.     Past Medical History:  Diagnosis Date  . Breast cancer ([place])   . Breast cancer, stage 2 ([place]) 02/23/2011  . Hypertension   . TIA (transient ischemic attack) 11/25/2011   "first time" (11/25/2011)    Patient Active Problem List   Diagnosis Date Noted  . Breast cancer of upper-outer quadrant of left female breast ([place]) 11/12/2012  . Polysubstance abuse ([place]) 11/26/2011  . TIA (transient ischemic attack) 11/25/2011  . HTN (hypertension) 11/25/2011  . Sinusitis 11/25/2011  . Vitamin B 12 deficiency 07/18/2011  . Breast cancer, stage 2 ([place]) 02/23/2011    Past Surgical History:  Procedure Laterality Date  . BREAST BIOPSY  2007   left  . MASTECTOMY  09/2005   left  . TONSILLECTOMY AND ADENOIDECTOMY     "when I was little" (11/25/2011)  . TUBAL LIGATION  1993     OB History    Gravida  5   Para  4   Term  4   Preterm  0   AB  1   Living        SAB  1   TAB  0   Ectopic  0   Multiple      Live Births               Home Medications    Prior to Admission medications   Medication Sig Start Date End Date Taking? Authorizing Provider  Multiple Vitamin (MULTIVITAMIN WITH MINERALS) TABS tablet Take 1 tablet by mouth daily.   Yes [provider]  naproxen sodium (ANAPROX) 220 MG tablet Take 440 mg by mouth 2 (two) times daily as needed (pain).   Yes [provider]  amLODipine (NORVASC) 5  MG tablet Take 1 tablet (5 mg total) by mouth daily. 04/20/17   Isla Pence, MD  azithromycin (ZITHROMAX) 250 MG tablet Take 1 tablet daily. 04/21/17   Isla Pence, MD  ibuprofen (ADVIL,MOTRIN) 600 MG tablet Take 1 tablet (600 mg total) by mouth every 6 (six) hours as needed. 04/20/17   Isla Pence, MD  metroNIDAZOLE (FLAGYL) 500 MG tablet Take 1 tablet (500 mg total) by mouth 2 (two) times daily. Patient not taking: Reported on 11/28/2016 12/28/15   Constant, Peggy, MD    Family History Family History  Problem Relation Age of Onset  . Heart disease Mother   . Sickle cell anemia Cousin        maternal cousin    Social History Social History   Tobacco Use  . Smoking status: Current Every Day Smoker    Packs/day: 0.00    Years: 20.00    Pack years: 0.00    Types: Cigarettes  . Smokeless tobacco: Never Used  Substance Use Topics  . Alcohol use: Yes    Alcohol/week: 7.2 oz    Types: 12 Cans of  beer per week    Comment: occasionally  . Drug use: Yes    Types: Marijuana    Comment: marijuana use 2 blunts a week     Allergies   Penicillins   Review of Systems Review of Systems  Constitutional: Positive for fatigue and fever.  Respiratory: Positive for cough and shortness of breath.   Gastrointestinal: Positive for diarrhea.  Musculoskeletal: Positive for arthralgias and myalgias.  All other systems reviewed and are negative.    Physical Exam Updated Vital Signs BP (!) 151/90 (BP Location: Right Arm)   Pulse (!) 113   Temp 98.1 F (36.7 C) (Oral)   Resp 16   SpO2 96%   Physical Exam  Constitutional: She is oriented to person, place, and time. She appears well-developed and well-nourished.  HENT:  Head: Normocephalic and atraumatic.  Right Ear: External ear normal.  Left Ear: External ear normal.  Nose: Nose normal.  Mouth/Throat: Mucous membranes are dry.  Eyes: Pupils are equal, round, and reactive to light. Conjunctivae and EOM are normal.  Neck:  Normal range of motion. Neck supple.  Cardiovascular: Regular rhythm, normal heart sounds and intact distal pulses. Tachycardia present.  Pulmonary/Chest: Effort normal and breath sounds normal.  Abdominal: Soft. Bowel sounds are normal.  Musculoskeletal: Normal range of motion.  Neurological: She is alert and oriented to person, place, and time.  Skin: Skin is warm. Capillary refill takes less than 2 seconds.  Psychiatric: She has a normal mood and affect. Her behavior is normal. Judgment and thought content normal.  Nursing note and vitals reviewed.    ED Treatments / Results  Labs (all labs ordered are listed, but only abnormal results are displayed) Labs Reviewed  COMPREHENSIVE METABOLIC PANEL - Abnormal; Notable for the following components:      Result Value   Glucose, Bld 119 (*)    Total Protein 8.3 (*)    All other components within normal limits  CBC WITH DIFFERENTIAL/PLATELET  URINALYSIS, ROUTINE W REFLEX MICROSCOPIC  PREGNANCY, URINE  INFLUENZA PANEL BY PCR (TYPE A & B)    EKG None  Radiology Dg Chest 2 View  Result Date: 04/20/2017 CLINICAL DATA:  Patient with body aches and congestion.  Cough EXAM: CHEST - 2 VIEW COMPARISON:  Chest radiograph 10/16/2015. FINDINGS: Stable enlarged cardiac and mediastinal contours. Focal consolidation left lower hemithorax. No pleural effusion or pneumothorax. Thoracic spine degenerative changes. IMPRESSION: Focal consolidation left lower hemithorax may represent atelectasis or infection. Followup PA and lateral chest X-ray is recommended in 3-4 weeks following trial of antibiotic therapy to ensure resolution and exclude underlying malignancy. Electronically Signed   By: Lovey Newcomer M.D.   On: 04/20/2017 09:11    Procedures Procedures (including critical care time)  Medications Ordered in ED Medications  albuterol (PROVENTIL HFA;VENTOLIN HFA) 108 (90 Base) MCG/ACT inhaler 1-2 puff (1 puff Inhalation Given 04/20/17 0856)  sodium  chloride 0.9 % bolus 1,000 mL (0 mLs Intravenous Stopped 04/20/17 0950)  ondansetron (ZOFRAN) injection 4 mg (4 mg Intravenous Given 04/20/17 0856)  ketorolac (TORADOL) 30 MG/ML injection 30 mg (30 mg Intravenous Given 04/20/17 0856)  AEROCHAMBER PLUS FLO-VU MEDIUM MISC 1 each (1 each Other Given 04/20/17 0856)  cefTRIAXone (ROCEPHIN) 1 g in sodium chloride 0.9 % 100 mL IVPB (0 g Intravenous Stopped 04/20/17 1003)  azithromycin (ZITHROMAX) tablet 500 mg (500 mg Oral Given 04/20/17 0932)     Initial Impression / Assessment and Plan / ED Course  I have reviewed the triage vital signs  and the nursing notes.  Pertinent labs & imaging results that were available during my care of the patient were reviewed by me and considered in my medical decision making (see chart for details).    Pt is feeling much better.  She is given an albuterol inhaler.  She will be d/c home on zithromax and she is also given refill of her bp meds.  Return if worse.  F/u with [facility] wellness.  Stop smoking.  Final Clinical Impressions(s) / ED Diagnoses   Final diagnoses:  Community acquired pneumonia of left lower lobe of lung ([place])  Essential hypertension  Tobacco abuse    ED Discharge Orders        Ordered    azithromycin (ZITHROMAX) 250 MG tablet     04/20/17 1023    amLODipine (NORVASC) 5 MG tablet  Daily     04/20/17 1023    ibuprofen (ADVIL,MOTRIN) 600 MG tablet  Every 6 hours PRN     04/20/17 1023       Isla Pence, MD 04/20/17 1032

## 2017-04-20 NOTE — ED Triage Notes (Signed)
Pt complains of body aches, nausea, nasal congestion, cough, diarrhea, sweating  for the past 2 days. Pt denies emesis.

## 2017-05-12 ENCOUNTER — Emergency Department (HOSPITAL_COMMUNITY): Payer: Self-pay

## 2017-05-12 ENCOUNTER — Encounter (HOSPITAL_COMMUNITY): Payer: Self-pay | Admitting: *Deleted

## 2017-05-12 ENCOUNTER — Emergency Department (HOSPITAL_COMMUNITY)
Admission: EM | Admit: 2017-05-12 | Discharge: 2017-05-12 | Disposition: A | Discharge status: Home / Self Care | Payer: Self-pay | Attending: Emergency Medicine | Admitting: Emergency Medicine

## 2017-05-12 ENCOUNTER — Other Ambulatory Visit: Payer: Self-pay

## 2017-05-12 DIAGNOSIS — I10 Essential (primary) hypertension: Secondary | ICD-10-CM | POA: Insufficient documentation

## 2017-05-12 DIAGNOSIS — Z79899 Other long term (current) drug therapy: Secondary | ICD-10-CM | POA: Insufficient documentation

## 2017-05-12 DIAGNOSIS — Z853 Personal history of malignant neoplasm of breast: Secondary | ICD-10-CM | POA: Insufficient documentation

## 2017-05-12 DIAGNOSIS — J441 Chronic obstructive pulmonary disease with (acute) exacerbation: Secondary | ICD-10-CM

## 2017-05-12 DIAGNOSIS — M545 Low back pain, unspecified: Secondary | ICD-10-CM

## 2017-05-12 DIAGNOSIS — F1210 Cannabis abuse, uncomplicated: Secondary | ICD-10-CM | POA: Insufficient documentation

## 2017-05-12 DIAGNOSIS — F17210 Nicotine dependence, cigarettes, uncomplicated: Secondary | ICD-10-CM | POA: Insufficient documentation

## 2017-05-12 LAB — I-STAT TROPONIN, ED: Troponin i, poc: 0 ng/mL (ref 0.00–0.08)

## 2017-05-12 LAB — CBC WITH DIFFERENTIAL/PLATELET
Basophils Absolute: 0 10*3/uL (ref 0.0–0.1)
Basophils Relative: 0 %
EOS ABS: 0.1 10*3/uL (ref 0.0–0.7)
Eosinophils Relative: 1 %
HEMATOCRIT: 40.7 % (ref 36.0–46.0)
HEMOGLOBIN: 13.6 g/dL (ref 12.0–15.0)
Lymphocytes Relative: 46 %
Lymphs Abs: 3.2 10*3/uL (ref 0.7–4.0)
MCH: 28.9 pg (ref 26.0–34.0)
MCHC: 33.4 g/dL (ref 30.0–36.0)
MCV: 86.6 fL (ref 78.0–100.0)
MONOS PCT: 10 %
Monocytes Absolute: 0.7 10*3/uL (ref 0.1–1.0)
NEUTROS ABS: 2.9 10*3/uL (ref 1.7–7.7)
NEUTROS PCT: 43 %
Platelets: 392 10*3/uL (ref 150–400)
RBC: 4.7 MIL/uL (ref 3.87–5.11)
RDW: 14.6 % (ref 11.5–15.5)
WBC: 6.9 10*3/uL (ref 4.0–10.5)

## 2017-05-12 LAB — URINALYSIS, ROUTINE W REFLEX MICROSCOPIC
BILIRUBIN URINE: NEGATIVE
Bacteria, UA: NONE SEEN
GLUCOSE, UA: NEGATIVE mg/dL
Hgb urine dipstick: NEGATIVE
KETONES UR: NEGATIVE mg/dL
Nitrite: NEGATIVE
PH: 5 (ref 5.0–8.0)
PROTEIN: NEGATIVE mg/dL
Specific Gravity, Urine: 1.018 (ref 1.005–1.030)

## 2017-05-12 LAB — COMPREHENSIVE METABOLIC PANEL
ALK PHOS: 67 U/L (ref 38–126)
ALT: 38 U/L (ref 14–54)
ANION GAP: 10 (ref 5–15)
AST: 46 U/L — ABNORMAL HIGH (ref 15–41)
Albumin: 4.3 g/dL (ref 3.5–5.0)
BILIRUBIN TOTAL: 0.8 mg/dL (ref 0.3–1.2)
BUN: 13 mg/dL (ref 6–20)
CALCIUM: 9.7 mg/dL (ref 8.9–10.3)
CO2: 25 mmol/L (ref 22–32)
CREATININE: 0.87 mg/dL (ref 0.44–1.00)
Chloride: 105 mmol/L (ref 101–111)
Glucose, Bld: 106 mg/dL — ABNORMAL HIGH (ref 65–99)
Potassium: 4.1 mmol/L (ref 3.5–5.1)
Sodium: 140 mmol/L (ref 135–145)
TOTAL PROTEIN: 8.2 g/dL — AB (ref 6.5–8.1)

## 2017-05-12 LAB — PREGNANCY, URINE: Preg Test, Ur: NEGATIVE

## 2017-05-12 LAB — I-STAT BETA HCG BLOOD, ED (MC, WL, AP ONLY): HCG, QUANTITATIVE: 5.8 m[IU]/mL — AB (ref <5)

## 2017-05-12 LAB — D-DIMER, QUANTITATIVE: D-Dimer, Quant: 0.33 ug/mL-FEU (ref 0.00–0.50)

## 2017-05-12 LAB — LIPASE, BLOOD: Lipase: 29 U/L (ref 11–51)

## 2017-05-12 MED ORDER — SODIUM CHLORIDE 0.9 % IV BOLUS
1000.0000 mL | Freq: Once | INTRAVENOUS | Status: AC
  Administered 2017-05-12: 1000 mL via INTRAVENOUS

## 2017-05-12 MED ORDER — MORPHINE SULFATE (PF) 4 MG/ML IV SOLN
4.0000 mg | Freq: Once | INTRAVENOUS | Status: AC
  Administered 2017-05-12: 4 mg via INTRAVENOUS
  Filled 2017-05-12: qty 1

## 2017-05-12 MED ORDER — METHYLPREDNISOLONE SODIUM SUCC 125 MG IJ SOLR
125.0000 mg | Freq: Once | INTRAMUSCULAR | Status: AC
  Administered 2017-05-12: 125 mg via INTRAVENOUS
  Filled 2017-05-12: qty 2

## 2017-05-12 MED ORDER — CYCLOBENZAPRINE HCL 10 MG PO TABS
5.0000 mg | ORAL_TABLET | Freq: Once | ORAL | Status: AC
  Administered 2017-05-12: 5 mg via ORAL
  Filled 2017-05-12: qty 1

## 2017-05-12 MED ORDER — ALBUTEROL SULFATE HFA 108 (90 BASE) MCG/ACT IN AERS
2.0000 | INHALATION_SPRAY | Freq: Once | RESPIRATORY_TRACT | Status: AC
  Administered 2017-05-12: 2 via RESPIRATORY_TRACT
  Filled 2017-05-12: qty 6.7

## 2017-05-12 MED ORDER — AMLODIPINE BESYLATE 5 MG PO TABS: 5.0000 mg | ORAL_TABLET | Freq: Once | ORAL | Status: DC

## 2017-05-12 MED ORDER — ALBUTEROL SULFATE (2.5 MG/3ML) 0.083% IN NEBU
5.0000 mg | INHALATION_SOLUTION | Freq: Once | RESPIRATORY_TRACT | Status: AC
  Administered 2017-05-12: 5 mg via RESPIRATORY_TRACT
  Filled 2017-05-12: qty 6

## 2017-05-12 MED ORDER — CYCLOBENZAPRINE HCL 5 MG PO TABS: 5.0000 mg | ORAL_TABLET | Freq: Three times a day (TID) | ORAL | 0 refills | Status: DC | PRN

## 2017-05-12 MED ORDER — PREDNISONE 20 MG PO TABS: ORAL_TABLET | ORAL | 0 refills | Status: DC

## 2017-05-12 NOTE — ED Notes (Signed)
RN notified of abnormal vitals. Pt states this is normal range

## 2017-05-12 NOTE — ED Provider Notes (Signed)
[facility] DEPT Provider Note   CSN: [id number] Arrival date & time: 05/12/17  1017     History   Chief Complaint Chief Complaint  Patient presents with  . Shortness of Breath  . Back Pain    HPI Sabrina Holland is a 47 y.o. female hx of breast cancer in remission, HTN, here presenting with cough, back pain, shortness of breath.  Patient was seen in the ED about a month ago and had a chest x-ray that showed a possible pneumonia and was treated with a course of azithromycin.  Patient states that the cough improved initially but she then had worsening cough and congestion.  She also has some subjective shortness of breath as well.  Moreover, patient was complaining of some lower back pain and flank pain but denies any urinary symptoms.  Has some chills at home but take her temperature.  She has severe pain since last night so she came in for evaluation.  Of note, patient was noted to be tachycardic in triage but states that her heart rate is always around 110.  The history is provided by the patient.    Past Medical History:  Diagnosis Date  . Breast cancer ([place])   . Breast cancer, stage 2 ([place]) 02/23/2011  . Hypertension   . TIA (transient ischemic attack) 11/25/2011   "first time" (11/25/2011)    Patient Active Problem List   Diagnosis Date Noted  . Breast cancer of upper-outer quadrant of left female breast ([place]) 11/12/2012  . Polysubstance abuse ([place]) 11/26/2011  . TIA (transient ischemic attack) 11/25/2011  . HTN (hypertension) 11/25/2011  . Sinusitis 11/25/2011  . Vitamin B 12 deficiency 07/18/2011  . Breast cancer, stage 2 ([place]) 02/23/2011    Past Surgical History:  Procedure Laterality Date  . BREAST BIOPSY  2007   left  . MASTECTOMY  09/2005   left  . TONSILLECTOMY AND ADENOIDECTOMY     "when I was little" (11/25/2011)  . TUBAL LIGATION  1993     OB History    Gravida  5   Para  4   Term  4   Preterm  0   AB  1   Living        SAB  1   TAB  0   Ectopic  0   Multiple      Live Births               Home Medications    Prior to Admission medications   Medication Sig Start Date End Date Taking? Authorizing Provider  Multiple Vitamin (MULTIVITAMIN WITH MINERALS) TABS tablet Take 1 tablet by mouth daily.   Yes [provider]  amLODipine (NORVASC) 5 MG tablet Take 1 tablet (5 mg total) by mouth daily. Patient not taking: Reported on 05/12/2017 04/20/17   Isla Pence, MD  azithromycin (ZITHROMAX) 250 MG tablet Take 1 tablet daily. Patient not taking: Reported on 05/12/2017 04/21/17   Isla Pence, MD  ibuprofen (ADVIL,MOTRIN) 600 MG tablet Take 1 tablet (600 mg total) by mouth every 6 (six) hours as needed. Patient not taking: Reported on 05/12/2017 04/20/17   Isla Pence, MD  metroNIDAZOLE (FLAGYL) 500 MG tablet Take 1 tablet (500 mg total) by mouth 2 (two) times daily. Patient not taking: Reported on 11/28/2016 12/28/15   Constant, Peggy, MD    Family History Family History  Problem Relation Age of Onset  . Heart disease Mother   . Sickle cell anemia Cousin  maternal cousin    Social History Social History   Tobacco Use  . Smoking status: Current Every Day Smoker    Packs/day: 0.00    Years: 20.00    Pack years: 0.00    Types: Cigarettes  . Smokeless tobacco: Never Used  Substance Use Topics  . Alcohol use: Yes    Alcohol/week: 7.2 oz    Types: 12 Cans of beer per week    Comment: occasionally  . Drug use: Yes    Types: Marijuana    Comment: marijuana use 2 blunts a week     Allergies   Penicillins and Tylenol [acetaminophen]   Review of Systems Review of Systems  Respiratory: Positive for shortness of breath.   Musculoskeletal: Positive for back pain.  All other systems reviewed and are negative.    Physical Exam Updated Vital Signs BP (!) 151/106   Pulse 89   Temp 97.7 F (36.5 C) (Oral)   Resp 18   Ht 5\' 5"  (1.651 m)   Wt 62.1  kg (137 lb)   SpO2 100%   BMI 22.80 kg/m   Physical Exam  Constitutional:  Uncomfortable   HENT:  Head: Normocephalic.  Mouth/Throat: Oropharynx is clear and moist.  Eyes: Pupils are equal, round, and reactive to light.  Neck: Normal range of motion.  Cardiovascular:  Slightly tachycardic   Pulmonary/Chest:  Slightly tachypneic, diminished bilateral bases, no obvious wheezing   Abdominal: Soft. Bowel sounds are normal.  Musculoskeletal:  Mild diffuse lower lumbar tenderness, ? Bilateral CVAT as well   Neurological: She is alert.  Skin: Skin is warm. Capillary refill takes less than 2 seconds.  Psychiatric: She has a normal mood and affect.  Nursing note and vitals reviewed.    ED Treatments / Results  Labs (all labs ordered are listed, but only abnormal results are displayed) Labs Reviewed  COMPREHENSIVE METABOLIC PANEL - Abnormal; Notable for the following components:      Result Value   Glucose, Bld 106 (*)    Total Protein 8.2 (*)    AST 46 (*)    All other components within normal limits  URINALYSIS, ROUTINE W REFLEX MICROSCOPIC - Abnormal; Notable for the following components:   APPearance HAZY (*)    Leukocytes, UA TRACE (*)    All other components within normal limits  I-STAT BETA HCG BLOOD, ED (MC, [facility], AP ONLY) - Abnormal; Notable for the following components:   I-stat hCG, quantitative 5.8 (*)    All other components within normal limits  CBC WITH DIFFERENTIAL/PLATELET  LIPASE, BLOOD  D-DIMER, QUANTITATIVE (NOT AT [facility])  PREGNANCY, URINE  I-STAT TROPONIN, ED    EKG EKG Interpretation  Date/Time:  Friday May 12 2017 11:16:53 EDT Ventricular Rate:  91 PR Interval:    QRS Duration: 79 QT Interval:  401 QTC Calculation: 494 R Axis:   59 Text Interpretation:  Sinus rhythm Biatrial enlargement Left ventricular hypertrophy Nonspecific T abnormalities, lateral leads Borderline prolonged QT interval No significant change since last tracing Confirmed by  Wandra Arthurs [phone]) on 05/12/2017 11:43:15 AM   Radiology Dg Chest 2 View  Result Date: 05/12/2017 CLINICAL DATA:  Cough and shortness of breath for 2 days. EXAM: CHEST - 2 VIEW COMPARISON:  04/20/2017 FINDINGS: The cardiac silhouette, mediastinal and hilar contours are within normal limits and stable, given the pectus deformity. There is mild tortuosity of the thoracic aorta. The lungs demonstrate hyperinflation. Suspect emphysematous changes. No definite infiltrates or effusions. The bony thorax is  intact. IMPRESSION: Hyperinflation and possible emphysematous changes. No acute pulmonary findings. Electronically Signed   By: Marijo Sanes M.D.   On: 05/12/2017 12:30   Dg Lumbar Spine Complete  Result Date: 05/12/2017 CLINICAL DATA:  Back pain for 3 days. EXAM: LUMBAR SPINE - COMPLETE 4+ VIEW COMPARISON:  Lumbar spine series 11/12/2012 FINDINGS: Normal alignment of the lumbar vertebral bodies. Disc spaces and vertebral bodies are maintained. The facets are normally aligned. No pars defects. The visualized bony pelvis is intact. Advanced aortoiliac calcifications for age. IMPRESSION: Normal alignment and no acute bony findings. Advanced aortic calcifications for age. Electronically Signed   By: Marijo Sanes M.D.   On: 05/12/2017 12:32    Procedures Procedures (including critical care time)  Medications Ordered in ED Medications  sodium chloride 0.9 % bolus 1,000 mL (0 mLs Intravenous Stopped 05/12/17 1136)  morphine 4 MG/ML injection 4 mg (4 mg Intravenous Given 05/12/17 1106)  methylPREDNISolone sodium succinate (SOLU-MEDROL) 125 mg/2 mL injection 125 mg (125 mg Intravenous Given 05/12/17 1353)  albuterol (PROVENTIL) (2.5 MG/3ML) 0.083% nebulizer solution 5 mg (5 mg Nebulization Given 05/12/17 1353)  sodium chloride 0.9 % bolus 1,000 mL (0 mLs Intravenous Stopped 05/12/17 1425)  cyclobenzaprine (FLEXERIL) tablet 5 mg (5 mg Oral Given 05/12/17 1353)     Initial Impression / Assessment and Plan /  ED Course  I have reviewed the triage vital signs and the nursing notes.  Pertinent labs & imaging results that were available during my care of the patient were reviewed by me and considered in my medical decision making (see chart for details).     Sabrina Holland is a 47 y.o. female here with back pain, cough, SOB. Had previous breast cancer and is borderline tachycardic. Consider recurrent cancer vs PE vs persistent cough after pneumonia vs pyelonephritis. Will get labs, d-dimer, CXR, UA. Will hydrate and reassess.   2:36 PM WBC nl. CXR showed no pneumonia. D-dimer neg. xrays unremarkable. Ambulated well in the ED. UA nl. Likely COPD exacerbation with back strain. Will dc home with steroids, albuterol prn, flexeril, NSAIDs.   Final Clinical Impressions(s) / ED Diagnoses   Final diagnoses:  None    ED Discharge Orders    None       Drenda Freeze, MD 05/12/17 1436

## 2017-05-12 NOTE — ED Triage Notes (Signed)
Pt complains of shortness of breath and back pain. Pt was recently diagnosed with pneumonia and finished her abx but states she never felt better.

## 2017-05-12 NOTE — Discharge Instructions (Signed)
Take prednisone as prescribed.   Take motrin for pain.   Take flexeril for muscle back spasms.   Use albuterol as needed for cough or wheezing.   Follow up with a doctor  Return to ER if you have worse back pain, trouble breathing, fever, weakness.

## 2017-06-19 ENCOUNTER — Emergency Department (HOSPITAL_COMMUNITY)
Admission: EM | Admit: 2017-06-19 | Discharge: 2017-06-19 | Disposition: A | Discharge status: Home / Self Care | Payer: No Typology Code available for payment source | Attending: Emergency Medicine | Admitting: Emergency Medicine

## 2017-06-19 ENCOUNTER — Encounter (HOSPITAL_COMMUNITY): Payer: Self-pay | Admitting: Emergency Medicine

## 2017-06-19 DIAGNOSIS — Z5321 Procedure and treatment not carried out due to patient leaving prior to being seen by health care provider: Secondary | ICD-10-CM | POA: Insufficient documentation

## 2017-06-19 DIAGNOSIS — R0602 Shortness of breath: Secondary | ICD-10-CM | POA: Insufficient documentation

## 2017-06-19 HISTORY — DX: Chronic obstructive pulmonary disease, unspecified: J44.9

## 2017-06-19 MED ORDER — ALBUTEROL SULFATE (2.5 MG/3ML) 0.083% IN NEBU
5.0000 mg | INHALATION_SOLUTION | Freq: Once | RESPIRATORY_TRACT | Status: AC
  Administered 2017-06-19: 5 mg via RESPIRATORY_TRACT
  Filled 2017-06-19: qty 6

## 2017-06-19 NOTE — ED Notes (Signed)
No answer from pt when called for room x 1.  

## 2017-06-19 NOTE — ED Notes (Signed)
No answer from patient when called x 3, final time.

## 2017-06-19 NOTE — ED Notes (Signed)
No answer from patient when called for a room x 2.

## 2017-06-19 NOTE — ED Notes (Signed)
More labs needed for patient, but pt refusing to have more blood drawn at this time. Pt reports she has already been stuck twice and she is "not a [place] pig". Tried to calmly speak with patient and advise that we will need blood work to move forward with her care and to hopefully obtain a diagnosis. Pt reports "there is nothing you can say to me to convince me to get more blood drawn".

## 2017-06-19 NOTE — ED Triage Notes (Signed)
Pt reports SOB "since last time I was over here and was given inhaler but hasnt worked, I was told have COPD".  also c/o side pain that started this morning.

## 2017-06-19 NOTE — ED Notes (Signed)
Blue top collected, pt in pain during stick reports being hard stick from chemo.

## 2017-06-20 ENCOUNTER — Inpatient Hospital Stay (HOSPITAL_COMMUNITY)
Admission: EM | Admit: 2017-06-20 | Discharge: 2017-06-23 | DRG: 292 | Disposition: A | Discharge status: Home / Self Care | Payer: Self-pay | Attending: Internal Medicine | Admitting: Internal Medicine

## 2017-06-20 ENCOUNTER — Emergency Department (HOSPITAL_COMMUNITY): Payer: Self-pay

## 2017-06-20 ENCOUNTER — Other Ambulatory Visit: Payer: Self-pay

## 2017-06-20 ENCOUNTER — Encounter (HOSPITAL_COMMUNITY): Payer: Self-pay

## 2017-06-20 DIAGNOSIS — F1910 Other psychoactive substance abuse, uncomplicated: Secondary | ICD-10-CM | POA: Diagnosis present

## 2017-06-20 DIAGNOSIS — I5041 Acute combined systolic (congestive) and diastolic (congestive) heart failure: Secondary | ICD-10-CM | POA: Diagnosis present

## 2017-06-20 DIAGNOSIS — Z853 Personal history of malignant neoplasm of breast: Secondary | ICD-10-CM | POA: Diagnosis present

## 2017-06-20 DIAGNOSIS — F1410 Cocaine abuse, uncomplicated: Secondary | ICD-10-CM | POA: Diagnosis present

## 2017-06-20 DIAGNOSIS — Z7151 Drug abuse counseling and surveillance of drug abuser: Secondary | ICD-10-CM

## 2017-06-20 DIAGNOSIS — J810 Acute pulmonary edema: Secondary | ICD-10-CM

## 2017-06-20 DIAGNOSIS — Z880 Allergy status to penicillin: Secondary | ICD-10-CM

## 2017-06-20 DIAGNOSIS — I248 Other forms of acute ischemic heart disease: Secondary | ICD-10-CM | POA: Diagnosis present

## 2017-06-20 DIAGNOSIS — I340 Nonrheumatic mitral (valve) insufficiency: Secondary | ICD-10-CM | POA: Diagnosis present

## 2017-06-20 DIAGNOSIS — F1210 Cannabis abuse, uncomplicated: Secondary | ICD-10-CM | POA: Diagnosis present

## 2017-06-20 DIAGNOSIS — E785 Hyperlipidemia, unspecified: Secondary | ICD-10-CM | POA: Diagnosis present

## 2017-06-20 DIAGNOSIS — E538 Deficiency of other specified B group vitamins: Secondary | ICD-10-CM | POA: Diagnosis present

## 2017-06-20 DIAGNOSIS — I10 Essential (primary) hypertension: Secondary | ICD-10-CM | POA: Diagnosis present

## 2017-06-20 DIAGNOSIS — Z79899 Other long term (current) drug therapy: Secondary | ICD-10-CM

## 2017-06-20 DIAGNOSIS — Z716 Tobacco abuse counseling: Secondary | ICD-10-CM

## 2017-06-20 DIAGNOSIS — F17210 Nicotine dependence, cigarettes, uncomplicated: Secondary | ICD-10-CM | POA: Diagnosis present

## 2017-06-20 DIAGNOSIS — Z9012 Acquired absence of left breast and nipple: Secondary | ICD-10-CM

## 2017-06-20 DIAGNOSIS — Z8673 Personal history of transient ischemic attack (TIA), and cerebral infarction without residual deficits: Secondary | ICD-10-CM

## 2017-06-20 DIAGNOSIS — I509 Heart failure, unspecified: Secondary | ICD-10-CM

## 2017-06-20 DIAGNOSIS — F1110 Opioid abuse, uncomplicated: Secondary | ICD-10-CM | POA: Diagnosis present

## 2017-06-20 DIAGNOSIS — Z8249 Family history of ischemic heart disease and other diseases of the circulatory system: Secondary | ICD-10-CM

## 2017-06-20 DIAGNOSIS — I110 Hypertensive heart disease with heart failure: Principal | ICD-10-CM | POA: Diagnosis present

## 2017-06-20 DIAGNOSIS — Z832 Family history of diseases of the blood and blood-forming organs and certain disorders involving the immune mechanism: Secondary | ICD-10-CM

## 2017-06-20 DIAGNOSIS — D649 Anemia, unspecified: Secondary | ICD-10-CM | POA: Diagnosis present

## 2017-06-20 DIAGNOSIS — Z9221 Personal history of antineoplastic chemotherapy: Secondary | ICD-10-CM

## 2017-06-20 DIAGNOSIS — R000 Tachycardia, unspecified: Secondary | ICD-10-CM | POA: Diagnosis present

## 2017-06-20 DIAGNOSIS — C50912 Malignant neoplasm of unspecified site of left female breast: Secondary | ICD-10-CM

## 2017-06-20 DIAGNOSIS — G459 Transient cerebral ischemic attack, unspecified: Secondary | ICD-10-CM | POA: Diagnosis present

## 2017-06-20 DIAGNOSIS — J439 Emphysema, unspecified: Secondary | ICD-10-CM | POA: Diagnosis present

## 2017-06-20 DIAGNOSIS — I5042 Chronic combined systolic (congestive) and diastolic (congestive) heart failure: Secondary | ICD-10-CM

## 2017-06-20 LAB — BRAIN NATRIURETIC PEPTIDE: B Natriuretic Peptide: 1658 pg/mL — ABNORMAL HIGH (ref 0.0–100.0)

## 2017-06-20 LAB — CBC WITH DIFFERENTIAL/PLATELET
BASOS ABS: 0 10*3/uL (ref 0.0–0.1)
Basophils Relative: 0 %
Eosinophils Absolute: 0.1 10*3/uL (ref 0.0–0.7)
Eosinophils Relative: 1 %
HEMATOCRIT: 35.6 % — AB (ref 36.0–46.0)
Hemoglobin: 11.3 g/dL — ABNORMAL LOW (ref 12.0–15.0)
LYMPHS PCT: 39 %
Lymphs Abs: 3 10*3/uL (ref 0.7–4.0)
MCH: 27.5 pg (ref 26.0–34.0)
MCHC: 31.7 g/dL (ref 30.0–36.0)
MCV: 86.6 fL (ref 78.0–100.0)
Monocytes Absolute: 0.7 10*3/uL (ref 0.1–1.0)
Monocytes Relative: 9 %
NEUTROS ABS: 3.9 10*3/uL (ref 1.7–7.7)
Neutrophils Relative %: 51 %
Platelets: 271 10*3/uL (ref 150–400)
RBC: 4.11 MIL/uL (ref 3.87–5.11)
RDW: 14.7 % (ref 11.5–15.5)
WBC: 7.6 10*3/uL (ref 4.0–10.5)

## 2017-06-20 LAB — TSH: TSH: 0.41 u[IU]/mL (ref 0.350–4.500)

## 2017-06-20 LAB — COMPREHENSIVE METABOLIC PANEL
ALBUMIN: 3.8 g/dL (ref 3.5–5.0)
ALK PHOS: 64 U/L (ref 38–126)
ALT: 21 U/L (ref 14–54)
AST: 29 U/L (ref 15–41)
Anion gap: 6 (ref 5–15)
BILIRUBIN TOTAL: 0.9 mg/dL (ref 0.3–1.2)
BUN: 13 mg/dL (ref 6–20)
CALCIUM: 9.2 mg/dL (ref 8.9–10.3)
CO2: 27 mmol/L (ref 22–32)
Chloride: 110 mmol/L (ref 101–111)
Creatinine, Ser: 0.95 mg/dL (ref 0.44–1.00)
GFR calc Af Amer: >60 mL/min (ref >60)
GFR calc non Af Amer: >60 mL/min (ref >60)
GLUCOSE: 124 mg/dL — AB (ref 65–99)
POTASSIUM: 4 mmol/L (ref 3.5–5.1)
Sodium: 143 mmol/L (ref 135–145)
TOTAL PROTEIN: 7.1 g/dL (ref 6.5–8.1)

## 2017-06-20 LAB — URINALYSIS, ROUTINE W REFLEX MICROSCOPIC
BACTERIA UA: NONE SEEN
BILIRUBIN URINE: NEGATIVE
Glucose, UA: NEGATIVE mg/dL
HGB URINE DIPSTICK: NEGATIVE
Ketones, ur: NEGATIVE mg/dL
NITRITE: NEGATIVE
PROTEIN: 30 mg/dL — AB
Specific Gravity, Urine: >1.046 — ABNORMAL HIGH (ref 1.005–1.030)
pH: 5 (ref 5.0–8.0)

## 2017-06-20 LAB — RAPID URINE DRUG SCREEN, HOSP PERFORMED
Amphetamines: NOT DETECTED
Barbiturates: NOT DETECTED
Benzodiazepines: NOT DETECTED
Cocaine: POSITIVE — AB
Opiates: POSITIVE — AB
Tetrahydrocannabinol: POSITIVE — AB

## 2017-06-20 LAB — I-STAT TROPONIN, ED: Troponin i, poc: 0.02 ng/mL (ref 0.00–0.08)

## 2017-06-20 LAB — I-STAT BETA HCG BLOOD, ED (MC, WL, AP ONLY): I-stat hCG, quantitative: 8.1 m[IU]/mL — ABNORMAL HIGH (ref <5)

## 2017-06-20 LAB — D-DIMER, QUANTITATIVE: D-Dimer, Quant: 2.47 ug/mL-FEU — ABNORMAL HIGH (ref 0.00–0.50)

## 2017-06-20 LAB — TROPONIN I: Troponin I: 0.05 ng/mL (ref <0.03)

## 2017-06-20 MED ORDER — IPRATROPIUM-ALBUTEROL 0.5-2.5 (3) MG/3ML IN SOLN
3.0000 mL | Freq: Once | RESPIRATORY_TRACT | Status: AC
  Administered 2017-06-20: 3 mL via RESPIRATORY_TRACT
  Filled 2017-06-20: qty 3

## 2017-06-20 MED ORDER — HYDRALAZINE HCL 25 MG PO TABS
25.0000 mg | ORAL_TABLET | Freq: Three times a day (TID) | ORAL | Status: DC
  Administered 2017-06-20: 25 mg via ORAL
  Filled 2017-06-20: qty 1

## 2017-06-20 MED ORDER — SODIUM CHLORIDE 0.9 % IV BOLUS
1000.0000 mL | Freq: Once | INTRAVENOUS | Status: AC
  Administered 2017-06-20: 1000 mL via INTRAVENOUS

## 2017-06-20 MED ORDER — ENOXAPARIN SODIUM 40 MG/0.4ML ~~LOC~~ SOLN
40.0000 mg | SUBCUTANEOUS | Status: DC
  Administered 2017-06-20 – 2017-06-22 (×3): 40 mg via SUBCUTANEOUS
  Filled 2017-06-20 (×3): qty 0.4

## 2017-06-20 MED ORDER — SODIUM CHLORIDE 0.9% FLUSH
3.0000 mL | Freq: Two times a day (BID) | INTRAVENOUS | Status: DC
  Administered 2017-06-20 – 2017-06-23 (×6): 3 mL via INTRAVENOUS

## 2017-06-20 MED ORDER — ASPIRIN 81 MG PO CHEW
CHEWABLE_TABLET | ORAL | Status: AC
  Administered 2017-06-20: 81 mg
  Filled 2017-06-20: qty 1

## 2017-06-20 MED ORDER — ADULT MULTIVITAMIN W/MINERALS CH
1.0000 | ORAL_TABLET | Freq: Every day | ORAL | Status: DC
  Administered 2017-06-21 – 2017-06-23 (×3): 1 via ORAL
  Filled 2017-06-20 (×3): qty 1

## 2017-06-20 MED ORDER — IPRATROPIUM-ALBUTEROL 0.5-2.5 (3) MG/3ML IN SOLN
3.0000 mL | Freq: Once | RESPIRATORY_TRACT | Status: AC
Start: 2017-06-20 — End: 2017-06-20
  Administered 2017-06-20: 3 mL via RESPIRATORY_TRACT
  Filled 2017-06-20: qty 3

## 2017-06-20 MED ORDER — TRAMADOL HCL 50 MG PO TABS
100.0000 mg | ORAL_TABLET | Freq: Four times a day (QID) | ORAL | Status: DC | PRN
  Administered 2017-06-20 – 2017-06-22 (×5): 100 mg via ORAL
  Filled 2017-06-20 (×5): qty 2

## 2017-06-20 MED ORDER — SODIUM CHLORIDE 0.9% FLUSH: 3.0000 mL | INTRAVENOUS | Status: DC | PRN

## 2017-06-20 MED ORDER — FUROSEMIDE 10 MG/ML IJ SOLN
40.0000 mg | Freq: Two times a day (BID) | INTRAMUSCULAR | Status: DC
  Administered 2017-06-21: 40 mg via INTRAVENOUS
  Filled 2017-06-20: qty 4

## 2017-06-20 MED ORDER — HYDROCODONE-ACETAMINOPHEN 5-325 MG PO TABS
1.0000 | ORAL_TABLET | Freq: Once | ORAL | Status: AC
  Administered 2017-06-20: 1 via ORAL
  Filled 2017-06-20: qty 1

## 2017-06-20 MED ORDER — IOPAMIDOL (ISOVUE-370) INJECTION 76%
INTRAVENOUS | Status: AC
  Filled 2017-06-20: qty 100

## 2017-06-20 MED ORDER — ONDANSETRON HCL 4 MG/2ML IJ SOLN
4.0000 mg | Freq: Four times a day (QID) | INTRAMUSCULAR | Status: DC | PRN
Start: 2017-06-20 — End: 2017-06-23

## 2017-06-20 MED ORDER — ASPIRIN EC 81 MG PO TBEC
81.0000 mg | DELAYED_RELEASE_TABLET | Freq: Every day | ORAL | Status: DC
  Administered 2017-06-21 – 2017-06-23 (×3): 81 mg via ORAL
  Filled 2017-06-20 (×3): qty 1

## 2017-06-20 MED ORDER — ACETAMINOPHEN 325 MG PO TABS
650.0000 mg | ORAL_TABLET | ORAL | Status: DC | PRN
  Administered 2017-06-21: 650 mg via ORAL
  Filled 2017-06-20: qty 2

## 2017-06-20 MED ORDER — FUROSEMIDE 10 MG/ML IJ SOLN
40.0000 mg | Freq: Once | INTRAMUSCULAR | Status: AC
  Administered 2017-06-20: 40 mg via INTRAVENOUS
  Filled 2017-06-20: qty 4

## 2017-06-20 MED ORDER — ENSURE ENLIVE PO LIQD
237.0000 mL | Freq: Two times a day (BID) | ORAL | Status: DC
  Administered 2017-06-21 – 2017-06-23 (×4): 237 mL via ORAL

## 2017-06-20 MED ORDER — IOPAMIDOL (ISOVUE-370) INJECTION 76%
100.0000 mL | Freq: Once | INTRAVENOUS | Status: AC | PRN
  Administered 2017-06-20: 100 mL via INTRAVENOUS

## 2017-06-20 MED ORDER — ISOSORB DINITRATE-HYDRALAZINE 20-37.5 MG PO TABS
1.0000 | ORAL_TABLET | Freq: Three times a day (TID) | ORAL | Status: DC
  Administered 2017-06-20 – 2017-06-22 (×7): 1 via ORAL
  Filled 2017-06-20 (×8): qty 1

## 2017-06-20 MED ORDER — METHYLPREDNISOLONE SODIUM SUCC 125 MG IJ SOLR
125.0000 mg | Freq: Once | INTRAMUSCULAR | Status: AC
  Administered 2017-06-20: 125 mg via INTRAVENOUS
  Filled 2017-06-20: qty 2

## 2017-06-20 MED ORDER — SODIUM CHLORIDE 0.9 % IV SOLN: 250.0000 mL | INTRAVENOUS | Status: DC | PRN

## 2017-06-20 NOTE — H&P (Signed)
History and Physical    [name]:[id number] DOB: 1970-06-06 DOA: 06/20/2017  PCP: Patient, No Pcp Per  Patient coming from: Home  I have personally briefly reviewed patient's old medical records in [place]  Chief Complaint: Shortness of breath  HPI: Sabrina Holland is a 47 y.o. 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  History of B12 deficiency, poorly controlled hypertension, polysubstance use presents to the ED with a 5-week history of worsening shortness of breath which has worsened over the past 3 days prior to admission with significant shortness of breath on exertion and on speaking per patient.  Patient states symptoms started on May 14, 2017.  Patient also endorses some chest tightness as well as complaints of right-sided chest pain with deep inspiration.  Patient endorses paroxysmal nocturnal dyspnea, 4 pillow orthopnea, generalized weakness, nausea, diaphoresis, wheezing, nonproductive cough.  Patient denies any lower extremity edema, no fever, no emesis, no abdominal pain, no dizziness, no diarrhea, no constipation, no dysuria, no syncope, no melena, no hematemesis, no hematochezia, no focal neurological deficits. She had initially presented to the ED 1 day prior to admission with similar symptoms however left AMA.  ED Course: She is seen in the ED, comprehensive metabolic profile unremarkable.  CBC with a hemoglobin of 11.3 otherwise unremarkable.  Point-of-care troponin was 0.02.  BNP was elevated at 1658.  Chest x-ray which was done showed evidence of mild interstitial thickening, likely indicative of chronic inflammatory type  change.  No edema or consolidation.  Heart borderline enlarged with pulmonary vascular normal.  D-dimer obtained was elevated at 2.47.  EKG showed a sinus tachycardia with LVH.  The angiogram chest obtained was negative for pulmonary embolus.  Bulla formation noted in the upper lobes.  Mild septal thickening seen throughout both lungs concerning for pulmonary edema.  Given a dose of Lasix 40 mg IV x1 in the ED.  Hospitalist were called to admit the patient for further evaluation and management.  Review of Systems: As per HPI otherwise 10 point review of systems negative.   Past Medical History:  Diagnosis Date  . Breast cancer ([place])   . Breast cancer, stage 2 ([place]) 02/23/2011  . COPD (chronic obstructive pulmonary disease) ([place])   . Hypertension   . TIA (transient ischemic attack) 11/25/2011   "first time" (11/25/2011)    Past Surgical History:  Procedure Laterality Date  . BREAST BIOPSY  2007   left  . MASTECTOMY  09/2005   left  . TONSILLECTOMY AND ADENOIDECTOMY     "when I was little" (11/25/2011)  . TUBAL LIGATION  1993     reports that she has been smoking cigarettes.  She has been smoking about 0.00 packs per day for the past 20.00 years. She has never used smokeless tobacco. She reports that she drinks about 7.2 oz of alcohol per week. She reports that she has current or past drug history. Drugs: Marijuana and Cocaine.  Allergies  Allergen Reactions  . Penicillins Other (See Comments)    Pt had slight throat swelling Has patient had a PCN reaction causing immediate rash, facial/tongue/throat swelling, SOB or lightheadedness with hypotension: No Has patient had a PCN reaction causing severe rash involving mucus membranes or skin necrosis: No  Has patient had a PCN reaction that required hospitalization No Has patient had a PCN reaction occurring within the last 10 years: No If all of the above answers are "NO", then may proceed with Cephalosporin use.     Family History    Problem Relation Age of Onset  . Heart disease Mother   . Sickle cell anemia Cousin        maternal cousin   Mother deceased age 56 from pneumonia.  Father deceased age 21 patient states does not know what her father died from.  Patient is single has 4 children and works at [employer] at [employer].  Prior to Admission medications   Medication Sig Start Date End Date Taking? Authorizing Provider  amLODipine (NORVASC) 5 MG tablet Take 1 tablet (5 mg total) by mouth daily. 04/20/17  Yes Isla Pence, MD  Multiple Vitamin (MULTIVITAMIN WITH MINERALS) TABS tablet Take 1 tablet by mouth daily.   Yes [provider]    Physical Exam: Vitals:   06/20/17 1600 06/20/17 1630 06/20/17 1730 06/20/17 1816  BP: (!) 134/98 (!) 152/124 (!) 157/125 (!) 143/113  Pulse: (!) 117 (!) 124 (!) 122 (!) 115  Resp: (!) 25 15 (!) 23 (!) 24  Temp:      TempSrc:      SpO2: 99% 100% 100% 100%  Weight:      Height:        Constitutional: NAD, calm, comfortable Vitals:   06/20/17 1600 06/20/17 1630 06/20/17 1730 06/20/17 1816  BP: (!) 134/98 (!) 152/124 (!) 157/125 (!) 143/113  Pulse: (!) 117 (!) 124 (!) 122 (!) 115  Resp: (!) 25 15 (!) 23 (!) 24  Temp:      TempSrc:      SpO2: 99% 100% 100% 100%  Weight:      Height:       Eyes: PERRL, lids and conjunctivae normal ENMT: Mucous membranes are moist. Posterior pharynx clear of any exudate or lesions.Normal dentition.  Neck: normal, supple, no masses, no thyromegaly Respiratory: Scattered crackles diffusely.  Poor to fair air movement.  No wheezing noted.  No rhonchi noted.  No use of accessory muscles of respiration.  Normal respiratory effort.   Cardiovascular: Regular rate and rhythm, no murmurs / rubs / gallops. No extremity edema. 2+ pedal pulses. No carotid bruits.  Abdomen: no tenderness, no masses palpated. No hepatosplenomegaly. Bowel sounds positive.  Musculoskeletal: no clubbing / cyanosis. No joint deformity upper and lower extremities.  Good ROM, no contractures. Normal muscle tone.  Skin: no rashes, lesions, ulcers. No induration Neurologic: CN 2-12 grossly intact. Sensation intact, DTR normal. Strength 5/5 in all 4.  Psychiatric: Normal judgment and insight. Alert and oriented x 3. Normal mood.   Labs on Admission: I have personally reviewed following labs and imaging studies  CBC: Recent Labs  Lab 06/20/17 1338  WBC 7.6  NEUTROABS 3.9  HGB 11.3*  HCT 35.6*  MCV 86.6  PLT 833   Basic Metabolic Panel: Recent Labs  Lab 06/20/17 1338  NA 143  K 4.0  CL 110  CO2 27  GLUCOSE 124*  BUN 13  CREATININE 0.95  CALCIUM 9.2   GFR: Estimated Creatinine Clearance: 64.5 mL/min (by C-G formula based on SCr of 0.95 mg/dL). Liver Function Tests: Recent Labs  Lab 06/20/17 1338  AST 29  ALT 21  ALKPHOS 64  BILITOT 0.9  PROT 7.1  ALBUMIN 3.8   No results for input(s): LIPASE, AMYLASE in the last 168 hours.  No results for input(s): AMMONIA in the last 168 hours. Coagulation Profile: No results for input(s): INR, PROTIME in the last 168 hours. Cardiac Enzymes: No results for input(s): CKTOTAL, CKMB, CKMBINDEX, TROPONINI in the last 168 hours. BNP (last 3 results) No results for input(s): PROBNP in the last 8760 hours. HbA1C: No results for input(s): HGBA1C in the last 72 hours. CBG: No results for input(s): GLUCAP in the last 168 hours. Lipid Profile: No results for input(s): CHOL, HDL, LDLCALC, TRIG, CHOLHDL, LDLDIRECT in the last 72 hours. Thyroid Function Tests: No results for input(s): TSH, T4TOTAL, FREET4, T3FREE, THYROIDAB in the last 72 hours. Anemia Panel: No results for input(s): VITAMINB12, FOLATE, FERRITIN, TIBC, IRON, RETICCTPCT in the last 72 hours. Urine analysis:    Component Value Date/Time   COLORURINE YELLOW 05/12/2017 1352   APPEARANCEUR HAZY (A) 05/12/2017 1352   LABSPEC 1.018 05/12/2017 1352   LABSPEC 1.015 11/29/2007 1119   PHURINE 5.0 05/12/2017 1352   GLUCOSEU NEGATIVE  05/12/2017 1352   HGBUR NEGATIVE 05/12/2017 1352   BILIRUBINUR NEGATIVE 05/12/2017 1352   BILIRUBINUR Negative 11/29/2007 1119   KETONESUR NEGATIVE 05/12/2017 1352   PROTEINUR NEGATIVE 05/12/2017 1352   UROBILINOGEN 0.2 03/02/2009 0856   NITRITE NEGATIVE 05/12/2017 1352   LEUKOCYTESUR TRACE (A) 05/12/2017 1352   LEUKOCYTESUR Negative 11/29/2007 1119    Radiological Exams on Admission: Dg Chest 2 View  Result Date: 06/20/2017 CLINICAL DATA:  Shortness of breath and chest pain EXAM: CHEST - 2 VIEW COMPARISON:  May 12, 2017 FINDINGS: There is mild interstitial thickening in the mid lower lung zones. There is no appreciable edema or consolidation. Heart is borderline enlarged with pulmonary vascularity within normal limits. No adenopathy. No bone lesions. No pneumothorax. IMPRESSION: Areas of mild interstitial thickening, likely indicative of chronic inflammatory type change. No edema or consolidation. Heart borderline enlarged with pulmonary vascularity normal. Electronically Signed   By: Lowella Grip III M.D.   On: 06/20/2017 14:22   Ct Angio Chest Pe W And/or Wo Contrast  Result Date: 06/20/2017 CLINICAL DATA:  Shortness of breath. EXAM: CT ANGIOGRAPHY CHEST WITH CONTRAST TECHNIQUE: Multidetector CT imaging of the chest was performed using the standard protocol during bolus administration of intravenous contrast. Multiplanar CT image reconstructions and MIPs were obtained to evaluate the vascular anatomy. CONTRAST:  147m ISOVUE-370 IOPAMIDOL (ISOVUE-370) INJECTION 76% COMPARISON:  Radiographs of same day.  CT scan of March 21, 2008. FINDINGS: Cardiovascular: Satisfactory opacification of the pulmonary arteries to the segmental level. No evidence of pulmonary embolism. Mild cardiomegaly is noted without pericardial effusion. No pericardial effusion. Mediastinum/Nodes: No enlarged mediastinal, hilar, or axillary lymph nodes. Thyroid gland, trachea, and esophagus demonstrate no significant  findings. Lungs/Pleura: No pneumothorax or pleural effusion is noted. Bulla formation is noted in both upper lobes. Mild septal thickening is noted throughout both lungs concerning for pulmonary edema. Upper Abdomen: No acute abnormality. Musculoskeletal: No chest wall abnormality. No acute or significant osseous findings. Review of the MIP images confirms the above findings. IMPRESSION: No definite evidence of pulmonary embolus. Bulla formation is noted in both upper lobes. Mild septal thickening is seen throughout both lungs concerning for pulmonary edema. Electronically Signed   By: JMarijo Conception M.D.   On: 06/20/2017 15:36    EKG: Independently reviewed.  Sinus tachycardia with LVH.  No ischemic changes noted.  Assessment/Plan Principal Problem:   Acute exacerbation of CHF (congestive heart failure) (HCC) Active Problems:   Breast cancer, stage 2 (HCC)   Vitamin B 12 deficiency  TIA (transient ischemic attack)   HTN (hypertension)   Polysubstance abuse ([place])   Anemia   #1 acute exacerbation of CHF Patient presented with worsening shortness of breath over the 5 weeks which is acutely worsened over the past 3 days, associated nonproductive cough, paroxysmal nocturnal dyspnea, 4 pillow orthopnea, weakness.  Patient stated over the past 3 days shortness of breath has worsened with minimal exertion and on speaking.  Seen in the ED, BNP elevated at 1658.  EKG with sinus tachycardia and LVH.  Point-of-care troponin negative.  Patient with history of polysubstance abuse.  Check a UDS.  Check a TSH.  Cycle cardiac enzymes every 6 hours x3, check a fasting lipid panel, check a 2D echo.  Place on Lasix 40 mg IV every 12 hours.  Place on BiDil.  Strict I's and O's.  Daily weights.  If 2D echo was abnormal will consult with cardiology for further evaluation and management.  2.  Hypertension Patient noted to be hypertensive with blood pressure of 157/125 and a heart rate of 124.  EKG consistent with  LVH.  Patient has no PCP due to insurance issues.  Patient noted to be on Norvasc 5 mg daily.  Will place patient on BiDil 3 times daily.  Follow.  3.  Anemia/vitamin B12 deficiency And anemia panel.  Follow H&H.  4.  Polysubstance abuse  Patient was counseled on cessation of polysubstance use.  Check a UDS.  Social work consult.  5.  History of TIA Aspirin.  Check a fasting lipid panel.  Risk factor modification.  6.  Tobacco abuse Tobacco cessation.  #7 history of breast cancer status post left mastectomy, and sentinel lymph node dissection September 2008 for a T2 N0, grade 3, strongly estrogen receptor positive, moderately progesterone receptor positive, HER2 negative invasive ductal carcinoma. Patient  status-postadjuvant chemotherapy. On tamoxifen betweenJanuary 2009and January 2013, when she was switched to letrozole, stopped due to side effects. Tamoxifen resumed 07/18/2011, then discontinued in November 2013 after an apparent TIA. Aromatase inhibitors were not resumed. Patient noted to be clinically and radiographically without evidence of disease or recurrence of breast cancer. Outpatient follow-up with oncology.     DVT prophylaxis: Lovenox Code Status: Full Family Communication: updated patient.  No family present. Disposition Plan: Home once acute CHF exacerbation is resolved and work-up completed. Consults called: None Admission status: Admit to inpatient.   Irine Seal MD [facility] Pager [phone]  If 7PM-7AM, please contact night-coverage [email or web address] Password [facility]  06/20/2017, 6:54 PM

## 2017-06-20 NOTE — ED Provider Notes (Signed)
[facility] DEPT Provider Note   CSN: [id number] Arrival date & time: 06/20/17  1215     History   Chief Complaint Chief Complaint  Patient presents with  . Shortness of Breath    HPI Sabrina Holland is a 47 y.o. female.  HPI Sabrina Holland is a 47 y.o. female with history of breast cancer in remission, COPD, hypertension, TIA, presents to emergency department with complaint of shortness of breath and chest pain.  Patient states she has had shortness of breath for a month. States was diagnosed with pneumonia in April, finished all of her medications and antibiotics at that time and felt better.  States she is having dry nonproductive cough.  She reports pain to the right side of the chest that is worse with coughing.  She reports exertional shortness of breath.  She states that she was told she had COPD, and was treated with an inhaler.  She states she takes inhaler almost every 1-2 hours with no relief.  She denies any fever or chills.  She denies any recent travel surgeries.  No swelling in extremities.  No prior cardiac problems. Is  Current smoker  Past Medical History:  Diagnosis Date  . Breast cancer ([place])   . Breast cancer, stage 2 ([place]) 02/23/2011  . COPD (chronic obstructive pulmonary disease) ([place])   . Hypertension   . TIA (transient ischemic attack) 11/25/2011   "first time" (11/25/2011)    Patient Active Problem List   Diagnosis Date Noted  . Breast cancer of upper-outer quadrant of left female breast ([place]) 11/12/2012  . Polysubstance abuse ([place]) 11/26/2011  . TIA (transient ischemic attack) 11/25/2011  . HTN (hypertension) 11/25/2011  . Sinusitis 11/25/2011  . Vitamin B 12 deficiency 07/18/2011  . Breast cancer, stage 2 ([place]) 02/23/2011    Past Surgical History:  Procedure Laterality Date  . BREAST BIOPSY  2007   left  . MASTECTOMY  09/2005   left  . TONSILLECTOMY AND ADENOIDECTOMY     "when I was little"  (11/25/2011)  . TUBAL LIGATION  1993     OB History    Gravida  5   Para  4   Term  4   Preterm  0   AB  1   Living        SAB  1   TAB  0   Ectopic  0   Multiple      Live Births               Home Medications    Prior to Admission medications   Medication Sig Start Date End Date Taking? Authorizing Provider  amLODipine (NORVASC) 5 MG tablet Take 1 tablet (5 mg total) by mouth daily. Patient not taking: Reported on 05/12/2017 04/20/17   Isla Pence, MD  azithromycin (ZITHROMAX) 250 MG tablet Take 1 tablet daily. Patient not taking: Reported on 05/12/2017 04/21/17   Isla Pence, MD  cyclobenzaprine (FLEXERIL) 5 MG tablet Take 1 tablet (5 mg total) by mouth 3 (three) times daily as needed for muscle spasms. 05/12/17   Drenda Freeze, MD  ibuprofen (ADVIL,MOTRIN) 600 MG tablet Take 1 tablet (600 mg total) by mouth every 6 (six) hours as needed. Patient not taking: Reported on 05/12/2017 04/20/17   Isla Pence, MD  metroNIDAZOLE (FLAGYL) 500 MG tablet Take 1 tablet (500 mg total) by mouth 2 (two) times daily. Patient not taking: Reported on 11/28/2016 12/28/15   Constant, Vickii Chafe, MD  Multiple Vitamin (MULTIVITAMIN WITH MINERALS) TABS tablet Take 1 tablet by mouth daily.    [provider]  predniSONE (DELTASONE) 20 MG tablet Take 60 mg daily x 2 days then 40 mg daily x 2 days then 20 mg daily x 2 days 05/12/17   Drenda Freeze, MD    Family History Family History  Problem Relation Age of Onset  . Heart disease Mother   . Sickle cell anemia Cousin        maternal cousin    Social History Social History   Tobacco Use  . Smoking status: Current Every Day Smoker    Packs/day: 0.00    Years: 20.00    Pack years: 0.00    Types: Cigarettes  . Smokeless tobacco: Never Used  . Tobacco comment: 1 or 2 cigarettes a day- approx 1 pack week  Substance Use Topics  . Alcohol use: Yes    Alcohol/week: 7.2 oz    Types: 12 Cans of beer per week     Comment: occasionally  . Drug use: Yes    Types: Marijuana    Comment: marijuana use 2 blunts a week- less than an oz /week     Allergies   Penicillins   Review of Systems Review of Systems  Constitutional: Negative for chills and fever.  Respiratory: Positive for cough, chest tightness and shortness of breath.   Cardiovascular: Positive for chest pain. Negative for palpitations and leg swelling.  Gastrointestinal: Negative for abdominal pain, diarrhea, nausea and vomiting.  Genitourinary: Negative for dysuria, flank pain, pelvic pain, vaginal bleeding, vaginal discharge and vaginal pain.  Musculoskeletal: Negative for arthralgias, myalgias, neck pain and neck stiffness.  Skin: Negative for rash.  Neurological: Negative for dizziness, weakness and headaches.  All other systems reviewed and are negative.    Physical Exam Updated Vital Signs BP (!) 126/97 (BP Location: Right Arm)   Pulse (!) 107   Temp (!) 97.4 F (36.3 C) (Oral)   Resp (!) 22   Ht 5\' 5"  (1.651 m)   Wt 64.4 kg (142 lb)   LMP 02/18/2015 (Exact Date) Comment: MAYBE ONCE A YEAR  SpO2 100%   BMI 23.63 kg/m   Physical Exam  Constitutional: She appears well-developed and well-nourished. No distress.  HENT:  Head: Normocephalic.  Eyes: Conjunctivae are normal.  Neck: Neck supple.  Cardiovascular: Normal rate, regular rhythm and normal heart sounds.  Pulmonary/Chest: Effort normal and breath sounds normal. No respiratory distress. She has no wheezes. She has no rales.  Abdominal: Soft. Bowel sounds are normal. She exhibits no distension. There is no tenderness. There is no rebound.  Musculoskeletal: She exhibits no edema.  Neurological: She is alert.  Skin: Skin is warm and dry.  Psychiatric: She has a normal mood and affect. Her behavior is normal.  Nursing note and vitals reviewed.    ED Treatments / Results  Labs (all labs ordered are listed, but only abnormal results are displayed) Labs Reviewed    CBC WITH DIFFERENTIAL/PLATELET - Abnormal; Notable for the following components:      Result Value   Hemoglobin 11.3 (*)    HCT 35.6 (*)    All other components within normal limits  D-DIMER, QUANTITATIVE (NOT AT [facility]) - Abnormal; Notable for the following components:   D-Dimer, Quant 2.47 (*)    All other components within normal limits  COMPREHENSIVE METABOLIC PANEL - Abnormal; Notable for the following components:   Glucose, Bld 124 (*)    All other components  within normal limits  BRAIN NATRIURETIC PEPTIDE - Abnormal; Notable for the following components:   B Natriuretic Peptide 1,658.0 (*)    All other components within normal limits  TROPONIN I - Abnormal; Notable for the following components:   Troponin I 0.05 (*)    All other components within normal limits  RAPID URINE DRUG SCREEN, HOSP PERFORMED - Abnormal; Notable for the following components:   Opiates POSITIVE (*)    Cocaine POSITIVE (*)    Tetrahydrocannabinol POSITIVE (*)    All other components within normal limits  URINALYSIS, ROUTINE W REFLEX MICROSCOPIC - Abnormal; Notable for the following components:   Specific Gravity, Urine >1.046 (*)    Protein, ur 30 (*)    Leukocytes, UA SMALL (*)    All other components within normal limits  I-STAT BETA HCG BLOOD, ED ([facility], [facility], [facility] ONLY) - Abnormal; Notable for the following components:   I-stat hCG, quantitative 8.1 (*)    All other components within normal limits  URINE CULTURE  TSH  TROPONIN I  TROPONIN I  LIPID PANEL  HIV ANTIBODY (ROUTINE TESTING)  BASIC METABOLIC PANEL  MAGNESIUM  BRAIN NATRIURETIC PEPTIDE  VITAMIN B12  FOLATE  IRON AND TIBC  FERRITIN  CBC  I-STAT TROPONIN, ED    EKG EKG Interpretation  Date/Time:  Tuesday June 20 2017 12:31:50 EDT Ventricular Rate:  108 PR Interval:    QRS Duration: 76 QT Interval:  367 QTC Calculation: 492 R Axis:   87 Text Interpretation:  Sinus tachycardia Biatrial enlargement Left ventricular hypertrophy  Borderline prolonged QT interval No significant change was found Confirmed by Jola Schmidt [id number]) on 06/20/2017 1:54:43 PM Also confirmed by Milton Ferguson [phone])  on 06/20/2017 4:18:49 PM   Radiology Dg Chest 2 View  Result Date: 06/20/2017 CLINICAL DATA:  Shortness of breath and chest pain EXAM: CHEST - 2 VIEW COMPARISON:  May 12, 2017 FINDINGS: There is mild interstitial thickening in the mid lower lung zones. There is no appreciable edema or consolidation. Heart is borderline enlarged with pulmonary vascularity within normal limits. No adenopathy. No bone lesions. No pneumothorax. IMPRESSION: Areas of mild interstitial thickening, likely indicative of chronic inflammatory type change. No edema or consolidation. Heart borderline enlarged with pulmonary vascularity normal. Electronically Signed   By: Lowella Grip III M.D.   On: 06/20/2017 14:22   Ct Angio Chest Pe W And/or Wo Contrast  Result Date: 06/20/2017 CLINICAL DATA:  Shortness of breath. EXAM: CT ANGIOGRAPHY CHEST WITH CONTRAST TECHNIQUE: Multidetector CT imaging of the chest was performed using the standard protocol during bolus administration of intravenous contrast. Multiplanar CT image reconstructions and MIPs were obtained to evaluate the vascular anatomy. CONTRAST:  16mL ISOVUE-370 IOPAMIDOL (ISOVUE-370) INJECTION 76% COMPARISON:  Radiographs of same day.  CT scan of March 21, 2008. FINDINGS: Cardiovascular: Satisfactory opacification of the pulmonary arteries to the segmental level. No evidence of pulmonary embolism. Mild cardiomegaly is noted without pericardial effusion. No pericardial effusion. Mediastinum/Nodes: No enlarged mediastinal, hilar, or axillary lymph nodes. Thyroid gland, trachea, and esophagus demonstrate no significant findings. Lungs/Pleura: No pneumothorax or pleural effusion is noted. Bulla formation is noted in both upper lobes. Mild septal thickening is noted throughout both lungs concerning for pulmonary edema.  Upper Abdomen: No acute abnormality. Musculoskeletal: No chest wall abnormality. No acute or significant osseous findings. Review of the MIP images confirms the above findings. IMPRESSION: No definite evidence of pulmonary embolus. Bulla formation is noted in both upper lobes. Mild septal thickening is seen throughout  both lungs concerning for pulmonary edema. Electronically Signed   By: Marijo Conception, M.D.   On: 06/20/2017 15:36    Procedures Procedures (including critical care time)  Medications Ordered in ED Medications  sodium chloride 0.9 % bolus 1,000 mL (has no administration in time range)  ipratropium-albuterol (DUONEB) 0.5-2.5 (3) MG/3ML nebulizer solution 3 mL (has no administration in time range)  HYDROcodone-acetaminophen (NORCO/VICODIN) 5-325 MG per tablet 1 tablet (has no administration in time range)     Initial Impression / Assessment and Plan / ED Course  I have reviewed the triage vital signs and the nursing notes.  Pertinent labs & imaging results that were available during my care of the patient were reviewed by me and considered in my medical decision making (see chart for details).     Patient in emergency department with shortness of breath, cough, right-sided chest pain.  She is tachycardic, however not hypoxic.  No wheezing on exam, she does have slightly decreased breath sounds on the right.  Will get a chest x-ray.  Will check labs including a d-dimer.  She does have high risk factor given she has history of cancer.  She is neurovascularly intact.  Will give a DuoNeb and will recheck.  Chest x-ray is negative.  Initial troponin is negative.  D-dimer elevated.  Will get CT angio for further evaluation.  Patient is not improved after breathing treatments, will try another one.  Patient continues to be tachypneic and tachycardic.  CT angios suspicious for pulmonary edema, no PE.  I will try some Lasix to diurese her.  She has no history of CHF.  Records reviewed,  she had an echo in 2013 which was normal.  Given abnormal vital signs with new finding of pulmonary edema, BNP ordered.  BNP is elevated, will admit to medicine for further evaluation and treatment.  Vitals:   06/20/17 1816 06/20/17 1851 06/20/17 1900 06/20/17 1948  BP: (!) 143/113 132/89 126/89 (!) 156/108  Pulse: (!) 115 (!) 109 (!) 107 (!) 112  Resp: (!) 24 (!) 25 (!) 25 (!) 22  Temp:    97.8 F (36.6 C)  TempSrc:    Oral  SpO2: 100% 100% 100% 100%  Weight:    62.9 kg (138 lb 9.6 oz)  Height:    5\' 5"  (1.651 m)     Final Clinical Impressions(s) / ED Diagnoses   Final diagnoses:  Acute pulmonary edema [facility])    ED Discharge Orders    None       Jeannett Senior, PA-C 06/20/17 2222    Milton Ferguson, MD 06/23/17 1313

## 2017-06-20 NOTE — ED Notes (Signed)
Bed: YJ49 Expected date:  Expected time:  Means of arrival:  Comments: EMS- SOB, Hx of COPD

## 2017-06-20 NOTE — ED Notes (Signed)
Pt ambulatory to restroom

## 2017-06-20 NOTE — ED Triage Notes (Signed)
Pt left AMA yesterday due to "being stuck too many times" with same complaint. SHOB. Pt has been [facility] since April. Pt states recently dx with COPD. Albuterol inhaler has not been helping. No wheezing noted with EMS. 97% RA. Also c/o right flank pain, chest tightness- pt states because she's been "breathing so hard". HR 108.

## 2017-06-20 NOTE — Progress Notes (Signed)
CRITICAL VALUE ALERT  Critical Value:  Troponin 0.05  Date & Time Notied:  06/20/17 2030  Provider Notified: Schorr, NP  Orders Received/Actions taken: no new orders at this time

## 2017-06-21 ENCOUNTER — Encounter (HOSPITAL_COMMUNITY): Payer: Self-pay

## 2017-06-21 ENCOUNTER — Inpatient Hospital Stay (HOSPITAL_COMMUNITY): Payer: Self-pay

## 2017-06-21 DIAGNOSIS — I361 Nonrheumatic tricuspid (valve) insufficiency: Secondary | ICD-10-CM

## 2017-06-21 LAB — CBC
HEMATOCRIT: 34.9 % — AB (ref 36.0–46.0)
HEMOGLOBIN: 11.5 g/dL — AB (ref 12.0–15.0)
MCH: 27.9 pg (ref 26.0–34.0)
MCHC: 33 g/dL (ref 30.0–36.0)
MCV: 84.7 fL (ref 78.0–100.0)
Platelets: 297 10*3/uL (ref 150–400)
RBC: 4.12 MIL/uL (ref 3.87–5.11)
RDW: 14.6 % (ref 11.5–15.5)
WBC: 5.9 10*3/uL (ref 4.0–10.5)

## 2017-06-21 LAB — LIPID PANEL
Cholesterol: 185 mg/dL (ref 0–200)
HDL: 93 mg/dL (ref >40)
LDL CALC: 80 mg/dL (ref 0–99)
TRIGLYCERIDES: 59 mg/dL (ref <150)
Total CHOL/HDL Ratio: 2 RATIO
VLDL: 12 mg/dL (ref 0–40)

## 2017-06-21 LAB — BASIC METABOLIC PANEL
Anion gap: 9 (ref 5–15)
BUN: 12 mg/dL (ref 6–20)
CALCIUM: 9.2 mg/dL (ref 8.9–10.3)
CO2: 25 mmol/L (ref 22–32)
CREATININE: 0.81 mg/dL (ref 0.44–1.00)
Chloride: 106 mmol/L (ref 101–111)
GFR calc non Af Amer: >60 mL/min (ref >60)
Glucose, Bld: 138 mg/dL — ABNORMAL HIGH (ref 65–99)
Potassium: 3.8 mmol/L (ref 3.5–5.1)
SODIUM: 140 mmol/L (ref 135–145)

## 2017-06-21 LAB — IRON AND TIBC
IRON: 31 ug/dL (ref 28–170)
Saturation Ratios: 8 % — ABNORMAL LOW (ref 10.4–31.8)
TIBC: 401 ug/dL (ref 250–450)
UIBC: 370 ug/dL

## 2017-06-21 LAB — ECHOCARDIOGRAM COMPLETE
Height: 65 in
Weight: 2182.4 oz

## 2017-06-21 LAB — FERRITIN: Ferritin: 26 ng/mL (ref 11–307)

## 2017-06-21 LAB — MAGNESIUM: Magnesium: 1.8 mg/dL (ref 1.7–2.4)

## 2017-06-21 LAB — HIV ANTIBODY (ROUTINE TESTING W REFLEX): HIV Screen 4th Generation wRfx: NONREACTIVE

## 2017-06-21 LAB — TROPONIN I
Troponin I: 0.03 ng/mL (ref <0.03)
Troponin I: 0.04 ng/mL (ref <0.03)

## 2017-06-21 LAB — BRAIN NATRIURETIC PEPTIDE: B NATRIURETIC PEPTIDE 5: 1844.5 pg/mL — AB (ref 0.0–100.0)

## 2017-06-21 LAB — FOLATE: Folate: 18.9 ng/mL (ref >5.9)

## 2017-06-21 LAB — VITAMIN B12: VITAMIN B 12: 515 pg/mL (ref 180–914)

## 2017-06-21 MED ORDER — NICOTINE 14 MG/24HR TD PT24
14.0000 mg | MEDICATED_PATCH | Freq: Every day | TRANSDERMAL | Status: DC
  Administered 2017-06-21 – 2017-06-23 (×3): 14 mg via TRANSDERMAL
  Filled 2017-06-21 (×3): qty 1

## 2017-06-21 MED ORDER — DIPHENHYDRAMINE HCL 25 MG PO CAPS
25.0000 mg | ORAL_CAPSULE | ORAL | Status: DC | PRN
  Administered 2017-06-22: 25 mg via ORAL
  Filled 2017-06-21: qty 1

## 2017-06-21 MED ORDER — LISINOPRIL 5 MG PO TABS
2.5000 mg | ORAL_TABLET | Freq: Every day | ORAL | Status: DC
  Administered 2017-06-21 – 2017-06-23 (×3): 2.5 mg via ORAL
  Filled 2017-06-21 (×3): qty 1

## 2017-06-21 MED ORDER — DIPHENHYDRAMINE HCL 50 MG PO CAPS
50.0000 mg | ORAL_CAPSULE | Freq: Once | ORAL | Status: AC
  Administered 2017-06-21: 50 mg via ORAL
  Filled 2017-06-21: qty 1

## 2017-06-21 MED ORDER — ATORVASTATIN CALCIUM 40 MG PO TABS
40.0000 mg | ORAL_TABLET | Freq: Every day | ORAL | Status: DC
  Administered 2017-06-21 – 2017-06-22 (×2): 40 mg via ORAL
  Filled 2017-06-21 (×2): qty 1

## 2017-06-21 NOTE — Progress Notes (Signed)
Resumed care of pt. Pt calm and cooperative at this time. Will continue to monitor.

## 2017-06-21 NOTE — Progress Notes (Signed)
  Echocardiogram 2D Echocardiogram has been performed.  Sabrina Holland 06/21/2017, 9:59 AM

## 2017-06-21 NOTE — Progress Notes (Signed)
CRITICAL VALUE ALERT  Critical Value:  Troponin 0.04  Date & Time Notied:  06/21/17 0030  Provider Notified: Schorr, NP  Orders Received/Actions taken: no new orders at this time

## 2017-06-21 NOTE — Care Management Note (Signed)
Case Management Note  Patient Details  Name: Sabrina Holland MRN: [id number] Date of Birth: 1970-01-19  Subjective/Objective: 47 y/o f admitted w/CHF. Hx: polysubstance abuse,tobacco.From home. No pcp,or insurance.Will provide pcp listing-encouraging [facility],seen by financial counselor-medicaid potential. Provided w[employer] med list. CHF screen-EF 25%-may qualify for CHF program, would need to be seen by cardiology to support Advanced CHF program.                   Action/Plan:d/c plan home.   Expected Discharge Date:  (unknown)               Expected Discharge Plan:  Home/Self Care  In-House Referral:     Discharge planning Services  CM Consult, [facility]  Post Acute Care Choice:    Choice offered to:     DME Arranged:    DME Agency:     HH Arranged:    HH Agency:     Status of Service:  In process, will continue to follow  If discussed at Long Length of Stay Meetings, dates discussed:    Additional Comments:  Dessa Phi, RN 06/21/2017, 2:16 PM

## 2017-06-21 NOTE — Progress Notes (Signed)
RN went to give pt benadryl for itching. Pt and visitors at bedside were discussing fluid restriction with this RN. This RN checked what had already been documented for the day in patients chart which was 721mL. Patient stated "im telling you they havent been keeping up with it all day" and said someone brought her a milkshake. Visitor at bedside was stating "they dont know what theyre doing, bye" over and over at this RN. This RN was explaining how much fluid each cup holds and that she can let us know if she drinks fluids so that we can document it appropriately. I also explained about keeping up with it at home. The patient began screaming "I HAVENT GOTTEN HOME YET YALL DID THIS TO ME YESTERDAY" and continued yelling at this RN to "get out, bye, bye, bye" and stated "you wont be coming back in here, I dont know who your parents are or who raised you but I'm older than you." This RN gave report to [employer].

## 2017-06-21 NOTE — Progress Notes (Signed)
PROGRESS NOTE  Sabrina Holland  SNK:[id number] DOB: [birth year] DOA: 06/20/2017 PCP: Patient, No Pcp Per   Brief Narrative: Sabrina Holland is a 47 y.o. 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 Evaluation showed mildly increased work of breathing but no hypoxia, BNP 1658, D-dimer elevated and CTA chest ruled out PE, showed upper lobe emphysema and septal thickening thought to be due to pulmonary edema. UDS +THC, opioid, cocaine. She had been evaluated previously in the ED but left AMA, this time she was given lasix and admitted. Echocardiogram has demonstrated new LV dysfunction with EF 25-30% and grade 3 diastolic dysfunction, diffuse hypokinesis. Troponin has remained very mildly elevated though she has no chest pain. Cardiology has been consulted.   Assessment & Plan: Principal Problem:   Acute exacerbation of CHF (congestive heart failure) (HCC) Active Problems:   Breast cancer, stage 2 (HCC)   Vitamin B 12 deficiency   TIA (transient ischemic attack)   HTN (hypertension)   Polysubstance abuse ([place])   Anemia  Acute CHF: New onset with EF 25-30%, G3DD, severe LAE.  - Will hold lasix for the rest of today as volume status has improved, received 40mg  IV this AM. IVC normal on echo. Wt down 142 > 136lbs.  - Started bidil on admission. Would need beta blocker, ACE/ARB, and ideally spironolactone if BP can tolerate. Will wait for cardiology rec's.  - Strict I/O, daily weights.  Troponin elevation: Very mild, not felt to be consistent with ACS.  - Further evaluation per cardiology  Hypertension: HTN on admission, LVH suggested by ECG though LV wall thickness normal on echo.  - Stopped norvasc, started bidil  Anemia: Mild. H/o  vit B12 deficiency with level wnl. Iron panel with normal ferritin and iron and TIBC. - Monitor CBC intermittently.  Polysubstance and tobacco abuse: UDS +opioid, THC, cocaine.  - Counseled that cessation is a must to improve health. Social work also consulted.   History of TIA:  - Continue aspirin. LDL is 80, start statin  History of breast cancer - Patient noted to be clinically and radiographically without evidence of disease or recurrence of breast cancer.  DVT prophylaxis: Lovenox Code Status: Full Family Communication: Daughter at bedside Disposition Plan: Home once evaluation completed. Care management consulted and has helped pt with establishing PCP.  Consultants:   Cardiology  Procedures:   Echocardiogram 06/21/2017: - Left ventricle: The cavity size was normal. Wall thickness was   normal. Systolic function was severely reduced. The estimated   ejection fraction was in the range of 25% to 30%. Abnormal GLS -   reported to be -12.5%. Diffuse hypokinesis. Doppler parameters   are consistent with restrictive left ventricular relaxation   (grade 3 diastolic dysfunction). The E/e&' ratio is >15,   suggesting elevated LV filling pressure. - Aorta: Ascending aortic diameter: 38 mm (S). - Ascending aorta: The ascending aorta was mildly dilated. - Mitral valve: Mildly thickened leaflets . There was mild to   moderate regurgitation. - Left atrium: Severely dilated. - Right ventricle: The cavity size was mildly dilated. Mildly   reduced systolic function. - Right atrium: The atrium was mildly dilated. - Atrial septum: Bows from left to right, suggesting high LA > RA   pressure. - Tricuspid valve: There was  mild regurgitation. - Pulmonary arteries: PA peak pressure: 30 mm Hg (S). - Inferior vena cava: The vessel was normal in size. The   respirophasic diameter changes were in the normal range (>= 50%),   consistent with normal central venous  pressure.  Impressions:  - LVEF 25-30%, normal wall thickness, severe global hypokinesis,   grade 3 DD with elevated LV filling pressure, dilated ascending   aorta to 3.8 cm, mild to moderate MR, severe LAE, mild RVE with   mildly reduced systolic function, mild LAE, mild TR, RVSP 30   mmHg, normal IVC.  Antimicrobials:  None   Subjective: No chest discomfort, breathing is much better than at admission, still dyspneic with exertion but improved.   Objective: Vitals:   06/20/17 2325 06/21/17 0500 06/21/17 0553 06/21/17 1306  BP: 130/90 (!) 138/98  112/75  Pulse:  100  93  Resp:  18  (!) 24  Temp:  97.9 F (36.6 C)  97.6 F (36.4 C)  TempSrc:  Oral  Oral  SpO2:  98%  96%  Weight:   61.9 kg (136 lb 6.4 oz)   Height:        Intake/Output Summary (Last 24 hours) at 06/21/2017 1426 Last data filed at 06/21/2017 1133 Gross per 24 hour  Intake 1662 ml  Output 2050 ml  Net -388 ml   Filed Weights   06/20/17 1231 06/20/17 1948 06/21/17 0553  Weight: 64.4 kg (142 lb) 62.9 kg (138 lb 9.6 oz) 61.9 kg (136 lb 6.4 oz)    Gen: Chronically ill-appearing 47 y.o. female in no distress  Pulm: Non-labored breathing room air. Clear to auscultation bilaterally.  CV: Regular rate and rhythm. No murmur, rub, or gallop. No JVD, no significant pedal edema. GI: Abdomen soft, non-tender, non-distended, with normoactive bowel sounds. No organomegaly or masses felt. Ext: Warm, no deformities Skin: No rashes, lesions or ulcers Neuro: Alert and oriented. No focal neurological deficits. Psych: Judgement and insight appear normal. Mood & affect appropriate.   Data Reviewed: I have personally reviewed following labs and imaging studies  CBC: Recent Labs  Lab 06/20/17 1338 06/21/17 0519  WBC 7.6 5.9  NEUTROABS 3.9  --   HGB 11.3* 11.5*  HCT 35.6* 34.9*  MCV 86.6 84.7  PLT 271 619   Basic Metabolic Panel: Recent Labs  Lab 06/20/17 1338 06/21/17 0519  NA 143 140  K 4.0 3.8  CL 110  106  CO2 27 25  GLUCOSE 124* 138*  BUN 13 12  CREATININE 0.95 0.81  CALCIUM 9.2 9.2  MG  --  1.8   GFR: Estimated Creatinine Clearance: 75.6 mL/min (by C-G formula based on SCr of 0.81 mg/dL). Liver Function Tests: Recent Labs  Lab 06/20/17 1338  AST 29  ALT 21  ALKPHOS 64  BILITOT 0.9  PROT 7.1  ALBUMIN 3.8   No results for input(s): LIPASE, AMYLASE in the last 168 hours. No results for input(s): AMMONIA in the last 168 hours. Coagulation Profile: No results for input(s): INR, PROTIME in the last 168 hours. Cardiac Enzymes: Recent Labs  Lab 06/20/17 1850 06/20/17 2331 06/21/17 0519  TROPONINI 0.05* 0.04* 0.03*   BNP (last 3 results) No results for input(s): PROBNP in the last 8760 hours. HbA1C: No results for input(s): HGBA1C in the last 72 hours. CBG: No results for input(s): GLUCAP in the last 168 hours. Lipid Profile: Recent Labs    06/21/17 0519  CHOL 185  HDL 93  LDLCALC 80  TRIG 59  CHOLHDL 2.0   Thyroid Function Tests: Recent Labs    06/20/17 1850  TSH 0.410   Anemia Panel: Recent Labs    06/21/17 0519  VITAMINB12 515  FOLATE 18.9  FERRITIN 26  TIBC 401  IRON 31   Urine analysis:    Component Value Date/Time   COLORURINE YELLOW 06/20/2017 1841   APPEARANCEUR CLEAR 06/20/2017 1841   LABSPEC >1.046 (H) 06/20/2017 1841   LABSPEC 1.015 11/29/2007 1119   PHURINE 5.0 06/20/2017 1841   GLUCOSEU NEGATIVE 06/20/2017 1841   HGBUR NEGATIVE 06/20/2017 1841   BILIRUBINUR NEGATIVE 06/20/2017 1841   BILIRUBINUR Negative 11/29/2007 1119   KETONESUR NEGATIVE 06/20/2017 1841   PROTEINUR 30 (A) 06/20/2017 1841   UROBILINOGEN 0.2 03/02/2009 0856   NITRITE NEGATIVE 06/20/2017 1841   LEUKOCYTESUR SMALL (A) 06/20/2017 1841   LEUKOCYTESUR Negative 11/29/2007 1119   No results found for this or any previous visit (from the past 240 hour(s)).    Radiology Studies: Dg Chest 2 View  Result Date: 06/20/2017 CLINICAL DATA:  Shortness of breath and  chest pain EXAM: CHEST - 2 VIEW COMPARISON:  May 12, 2017 FINDINGS: There is mild interstitial thickening in the mid lower lung zones. There is no appreciable edema or consolidation. Heart is borderline enlarged with pulmonary vascularity within normal limits. No adenopathy. No bone lesions. No pneumothorax. IMPRESSION: Areas of mild interstitial thickening, likely indicative of chronic inflammatory type change. No edema or consolidation. Heart borderline enlarged with pulmonary vascularity normal. Electronically Signed   By: Lowella Grip III M.D.   On: 06/20/2017 14:22   Ct Angio Chest Pe W And/or Wo Contrast  Result Date: 06/20/2017 CLINICAL DATA:  Shortness of breath. EXAM: CT ANGIOGRAPHY CHEST WITH CONTRAST TECHNIQUE: Multidetector CT imaging of the chest was performed using the standard protocol during bolus administration of intravenous contrast. Multiplanar CT image reconstructions and MIPs were obtained to evaluate the vascular anatomy. CONTRAST:  155mL ISOVUE-370 IOPAMIDOL (ISOVUE-370) INJECTION 76% COMPARISON:  Radiographs of same day.  CT scan of March 21, 2008. FINDINGS: Cardiovascular: Satisfactory opacification of the pulmonary arteries to the segmental level. No evidence of pulmonary embolism. Mild cardiomegaly is noted without pericardial effusion. No pericardial effusion. Mediastinum/Nodes: No enlarged mediastinal, hilar, or axillary lymph nodes. Thyroid gland, trachea, and esophagus demonstrate no significant findings. Lungs/Pleura: No pneumothorax or pleural effusion is noted. Bulla formation is noted in both upper lobes. Mild septal thickening is noted throughout both lungs concerning for pulmonary edema. Upper Abdomen: No acute abnormality. Musculoskeletal: No chest wall abnormality. No acute or significant osseous findings. Review of the MIP images confirms the above findings. IMPRESSION: No definite evidence of pulmonary embolus. Bulla formation is noted in both upper lobes. Mild  septal thickening is seen throughout both lungs concerning for pulmonary edema. Electronically Signed   By: Marijo Conception, M.D.   On: 06/20/2017 15:36    Scheduled Meds: . aspirin EC  81 mg Oral Daily  . enoxaparin (LOVENOX) injection  40 mg Subcutaneous Q24H  . feeding supplement (ENSURE ENLIVE)  237 mL Oral BID BM  . furosemide  40 mg Intravenous Q12H  . isosorbide-hydrALAZINE  1 tablet Oral TID  . multivitamin with minerals  1 tablet Oral Daily  . sodium chloride flush  3 mL Intravenous Q12H   Continuous Infusions: . sodium chloride       LOS: 1 day   Time spent: 35 minutes.  Patrecia Pour, MD [facility] [email or web address] Password [email or web address] 06/21/2017, 2:26 PM

## 2017-06-22 DIAGNOSIS — I5043 Acute on chronic combined systolic (congestive) and diastolic (congestive) heart failure: Secondary | ICD-10-CM

## 2017-06-22 DIAGNOSIS — I5021 Acute systolic (congestive) heart failure: Secondary | ICD-10-CM

## 2017-06-22 LAB — BASIC METABOLIC PANEL
Anion gap: 10 (ref 5–15)
BUN: 21 mg/dL — AB (ref 6–20)
CHLORIDE: 103 mmol/L (ref 101–111)
CO2: 29 mmol/L (ref 22–32)
CREATININE: 1.04 mg/dL — AB (ref 0.44–1.00)
Calcium: 9 mg/dL (ref 8.9–10.3)
GFR calc Af Amer: >60 mL/min (ref >60)
GFR calc non Af Amer: >60 mL/min (ref >60)
GLUCOSE: 100 mg/dL — AB (ref 65–99)
POTASSIUM: 3.5 mmol/L (ref 3.5–5.1)
SODIUM: 142 mmol/L (ref 135–145)

## 2017-06-22 LAB — URINE CULTURE

## 2017-06-22 MED ORDER — ALBUTEROL SULFATE (2.5 MG/3ML) 0.083% IN NEBU
2.5000 mg | INHALATION_SOLUTION | Freq: Four times a day (QID) | RESPIRATORY_TRACT | Status: DC | PRN
  Administered 2017-06-22: 2.5 mg via RESPIRATORY_TRACT
  Filled 2017-06-22: qty 3

## 2017-06-22 MED ORDER — SPIRONOLACTONE 12.5 MG HALF TABLET
12.5000 mg | ORAL_TABLET | Freq: Every day | ORAL | Status: DC
  Administered 2017-06-22 – 2017-06-23 (×2): 12.5 mg via ORAL
  Filled 2017-06-22 (×3): qty 1

## 2017-06-22 NOTE — Progress Notes (Signed)
Patient wants housing resources-CSW notified.

## 2017-06-22 NOTE — Progress Notes (Signed)
Nutrition Education Note  RD consulted for nutrition education regarding new onset CHF.  RD provided "Low Sodium Nutrition Therapy," "Sodium Content of Foods," and "Sodium Free Flavoring Tips" handouts from the Academy of [employer] and Dietetics. Reviewed patient's dietary recall. Provided examples on ways to decrease sodium intake in diet. Discouraged intake of processed foods and use of salt shaker. Encouraged fresh fruits and vegetables as well as whole grain sources of carbohydrates to maximize fiber intake.   RD discussed why it is important for patient to adhere to diet recommendations, and emphasized the role of fluids, foods to avoid, and importance of weighing self daily. Teach back method used.  Patient states that she does the cooking at home. She has a good understanding of the need to limit sodium and the rationale for this. She is very motivated to make changes in order to stay out of the hospital.   Expect good compliance.  Body mass index is 22.31 kg/m. Pt meets criteria for normal weight based on current BMI.  Current diet order is Heart Healthy. Labs and medications reviewed. No further nutrition interventions warranted at this time. RD contact information provided. If additional nutrition issues arise, please re-consult RD.      Jarome Matin, MS, RD, LDN, [facility] Inpatient Clinical Dietitian Pager # [phone] After hours/weekend pager # [phone]

## 2017-06-22 NOTE — Progress Notes (Addendum)
PROGRESS NOTE  Sabrina Holland  DXI:[id number] DOB: 1970/11/25 DOA: 06/20/2017 PCP: Patient, No Pcp Per   Brief Narrative: Sabrina Holland is a 47 y.o. 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 Evaluation showed mildly increased work of breathing but no hypoxia, BNP 1658, D-dimer elevated and CTA chest ruled out PE, showed upper lobe emphysema and septal thickening thought to be due to pulmonary edema. UDS +THC, opioid, cocaine. She had been evaluated previously in the ED but left AMA, this time she was given lasix and admitted. Echocardiogram has demonstrated new LV dysfunction with EF 25-30% and grade 3 diastolic dysfunction, diffuse hypokinesis. Troponin has remained very mildly elevated though she has no chest pain. Cardiology has been consulted.   Assessment & Plan: Principal Problem:   Acute exacerbation of CHF (congestive heart failure) (HCC) Active Problems:   Breast cancer, stage 2 (HCC)   Vitamin B 12 deficiency   TIA (transient ischemic attack)   HTN (hypertension)   Polysubstance abuse (HCC)   Anemia  Acute combined systolic and diastolic CHF: New onset with EF 25-30%, G3DD, severe LAE.  - Will hold lasix for now, creatinine bumped. Wt down 142 > 136 > 134lbs.  - Started bidil, added low dose lisinopril and cardiology has added spironolactone. Will need to monitor BP closely here and labs closely as outpatient.  - Would NOT recommend entresto unless this can be provided at little/no cost.  - No BB w/cocaine use.  - Strict I/O, daily weights.  Troponin elevation: Very mild, not felt to be consistent with ACS.  - Further evaluation per cardiology. Anticipate need for LHC  Hypertension: HTN on admission, LVH  suggested by ECG though LV wall thickness normal on echo.  - Stopped norvasc, started bidil, lisinopril and now spironolactone.   Anemia: Mild. H/o vit B12 deficiency with level wnl. Iron panel with normal ferritin and iron and TIBC. - Monitor CBC intermittently.  Polysubstance and tobacco abuse: UDS +opioid, THC, cocaine.  - Counseled that cessation is a must to improve health. Social work also consulted.   History of TIA:  - Continue aspirin. LDL is 80, start statin  History of breast cancer - Patient noted to be clinically and radiographically without evidence of disease or recurrence of breast cancer.  DVT prophylaxis: Lovenox Code Status: Full Family Communication: Daughter at bedside Disposition Plan: Home once evaluation completed, medications titrated. Care management consulted and has helped pt with establishing PCP. Will need affordable medications.   Consultants:   Cardiology  Procedures:   Echocardiogram 06/21/2017: - Left ventricle: The cavity size was normal. Wall thickness was   normal. Systolic function was severely reduced. The estimated   ejection fraction was in the range of 25% to 30%. Abnormal GLS -   reported to be -12.5%. Diffuse hypokinesis. Doppler parameters   are consistent with restrictive left ventricular relaxation   (grade 3 diastolic dysfunction). The E/e&' ratio is >15,   suggesting elevated LV filling pressure. - Aorta: Ascending aortic diameter: 38 mm (S). - Ascending aorta: The ascending aorta was mildly dilated. - Mitral valve: Mildly thickened leaflets . There was mild to   moderate regurgitation. - Left atrium: Severely dilated. - Right ventricle: The cavity size was mildly dilated. Mildly   reduced systolic function. -  Right atrium: The atrium was mildly dilated. - Atrial septum: Bows from left to right, suggesting high LA > RA   pressure. - Tricuspid valve: There was mild regurgitation. - Pulmonary arteries: PA peak pressure: 30  mm Hg (S). - Inferior vena cava: The vessel was normal in size. The   respirophasic diameter changes were in the normal range (>= 50%),   consistent with normal central venous pressure.  Impressions:  - LVEF 25-30%, normal wall thickness, severe global hypokinesis,   grade 3 DD with elevated LV filling pressure, dilated ascending   aorta to 3.8 cm, mild to moderate MR, severe LAE, mild RVE with   mildly reduced systolic function, mild LAE, mild TR, RVSP 30   mmHg, normal IVC.  Antimicrobials:  None   Subjective: Breathing continues to improve while exerting herself. No chest pain. Got dizzy this morning when she got up to the bathroom but attributes this to a feeling she usually gets around the time of blood draw.   Objective: Vitals:   06/21/17 0553 06/21/17 1306 06/21/17 2145 06/22/17 0522  BP:  112/75 105/70 113/89  Pulse:  93 89 87  Resp:  (!) 24 20 18   Temp:  97.6 F (36.4 C) 97.9 F (36.6 C) (!) 97.3 F (36.3 C)  TempSrc:  Oral Oral Oral  SpO2:  96% 96% 99%  Weight: 61.9 kg (136 lb 6.4 oz)   60.8 kg (134 lb 0.6 oz)  Height:        Intake/Output Summary (Last 24 hours) at 06/22/2017 1131 Last data filed at 06/22/2017 0630 Gross per 24 hour  Intake 822 ml  Output 2350 ml  Net -1528 ml   Filed Weights   06/20/17 1948 06/21/17 0553 06/22/17 0522  Weight: 62.9 kg (138 lb 9.6 oz) 61.9 kg (136 lb 6.4 oz) 60.8 kg (134 lb 0.6 oz)    Gen: Chronically ill-appearing 47 y.o. female in no distress  Pulm: Non-labored on room air, no crackles or wheezing. CV: Regular rate and rhythm. +S3 vs. widened split S2. No murmur, rub, or gallop. No JVD, no significant pedal edema. GI: Abdomen soft, non-tender, non-distended, with normoactive bowel sounds. No organomegaly or masses felt. Ext: Warm, no deformities Skin: No rashes, lesions or ulcers Neuro: Alert and oriented. No focal neurological deficits. Psych: Judgement and insight appear normal. Mood & affect appropriate.   Data  Reviewed: I have personally reviewed following labs and imaging studies  CBC: Recent Labs  Lab 06/20/17 1338 06/21/17 0519  WBC 7.6 5.9  NEUTROABS 3.9  --   HGB 11.3* 11.5*  HCT 35.6* 34.9*  MCV 86.6 84.7  PLT 271 656   Basic Metabolic Panel: Recent Labs  Lab 06/20/17 1338 06/21/17 0519 06/22/17 0509  NA 143 140 142  K 4.0 3.8 3.5  CL 110 106 103  CO2 27 25 29   GLUCOSE 124* 138* 100*  BUN 13 12 21*  CREATININE 0.95 0.81 1.04*  CALCIUM 9.2 9.2 9.0  MG  --  1.8  --    GFR: Estimated Creatinine Clearance: 58.9 mL/min (A) (by C-G formula based on SCr of 1.04 mg/dL (H)). Liver Function Tests: Recent Labs  Lab 06/20/17 1338  AST 29  ALT 21  ALKPHOS 64  BILITOT 0.9  PROT 7.1  ALBUMIN 3.8   No results for input(s): LIPASE, AMYLASE in the last 168 hours. No results for input(s): AMMONIA in the last 168 hours. Coagulation Profile: No results for input(s): INR, PROTIME in the last 168 hours.  Cardiac Enzymes: Recent Labs  Lab 06/20/17 1850 06/20/17 2331 06/21/17 0519  TROPONINI 0.05* 0.04* 0.03*   BNP (last 3 results) No results for input(s): PROBNP in the last 8760 hours. HbA1C: No results for input(s): HGBA1C in the last 72 hours. CBG: No results for input(s): GLUCAP in the last 168 hours. Lipid Profile: Recent Labs    06/21/17 0519  CHOL 185  HDL 93  LDLCALC 80  TRIG 59  CHOLHDL 2.0   Thyroid Function Tests: Recent Labs    06/20/17 1850  TSH 0.410   Anemia Panel: Recent Labs    06/21/17 0519  VITAMINB12 515  FOLATE 18.9  FERRITIN 26  TIBC 401  IRON 31   Urine analysis:    Component Value Date/Time   COLORURINE YELLOW 06/20/2017 1841   APPEARANCEUR CLEAR 06/20/2017 1841   LABSPEC >1.046 (H) 06/20/2017 1841   LABSPEC 1.015 11/29/2007 1119   PHURINE 5.0 06/20/2017 1841   GLUCOSEU NEGATIVE 06/20/2017 1841   HGBUR NEGATIVE 06/20/2017 1841   BILIRUBINUR NEGATIVE 06/20/2017 1841   BILIRUBINUR Negative 11/29/2007 1119   KETONESUR  NEGATIVE 06/20/2017 1841   PROTEINUR 30 (A) 06/20/2017 1841   UROBILINOGEN 0.2 03/02/2009 0856   NITRITE NEGATIVE 06/20/2017 1841   LEUKOCYTESUR SMALL (A) 06/20/2017 1841   LEUKOCYTESUR Negative 11/29/2007 1119   Recent Results (from the past 240 hour(s))  Culture, Urine     Status: Abnormal   Collection Time: 06/20/17  6:41 PM  Result Value Ref Range Status   Specimen Description   Final    URINE, RANDOM Performed at [facility], [place] [address] [place], [place] [address]    Special Requests   Final    NONE Performed at [facility], [place] [address] [place], [place] [address]    Culture MULTIPLE SPECIES PRESENT, SUGGEST RECOLLECTION (A)  Final   Report Status 06/22/2017 FINAL  Final      Radiology Studies: Dg Chest 2 View  Result Date: 06/20/2017 CLINICAL DATA:  Shortness of breath and chest pain EXAM: CHEST - 2 VIEW COMPARISON:  May 12, 2017 FINDINGS: There is mild interstitial thickening in the mid lower lung zones. There is no appreciable edema or consolidation. Heart is borderline enlarged with pulmonary vascularity within normal limits. No adenopathy. No bone lesions. No pneumothorax. IMPRESSION: Areas of mild interstitial thickening, likely indicative of chronic inflammatory type change. No edema or consolidation. Heart borderline enlarged with pulmonary vascularity normal. Electronically Signed   By: Lowella Grip III M.D.   On: 06/20/2017 14:22   Ct Angio Chest Pe W And/or Wo Contrast  Result Date: 06/20/2017 CLINICAL DATA:  Shortness of breath. EXAM: CT ANGIOGRAPHY CHEST WITH CONTRAST TECHNIQUE: Multidetector CT imaging of the chest was performed using the standard protocol during bolus administration of intravenous contrast. Multiplanar CT image reconstructions and MIPs were obtained to evaluate the vascular anatomy. CONTRAST:  189mL ISOVUE-370 IOPAMIDOL (ISOVUE-370) INJECTION 76% COMPARISON:  Radiographs of same day.  CT scan of  March 21, 2008. FINDINGS: Cardiovascular: Satisfactory opacification of the pulmonary arteries to the segmental level. No evidence of pulmonary embolism. Mild cardiomegaly is noted without pericardial effusion. No pericardial effusion. Mediastinum/Nodes: No enlarged mediastinal, hilar, or axillary lymph nodes. Thyroid gland, trachea, and esophagus demonstrate no significant findings. Lungs/Pleura: No pneumothorax or pleural effusion is noted. Bulla formation is noted in both upper lobes. Mild septal thickening is noted throughout both lungs concerning for pulmonary edema. Upper Abdomen: No acute abnormality. Musculoskeletal: No chest wall abnormality. No acute  or significant osseous findings. Review of the MIP images confirms the above findings. IMPRESSION: No definite evidence of pulmonary embolus. Bulla formation is noted in both upper lobes. Mild septal thickening is seen throughout both lungs concerning for pulmonary edema. Electronically Signed   By: Marijo Conception, M.D.   On: 06/20/2017 15:36    Scheduled Meds: . aspirin EC  81 mg Oral Daily  . atorvastatin  40 mg Oral q1800  . enoxaparin (LOVENOX) injection  40 mg Subcutaneous Q24H  . feeding supplement (ENSURE ENLIVE)  237 mL Oral BID BM  . isosorbide-hydrALAZINE  1 tablet Oral TID  . lisinopril  2.5 mg Oral Daily  . multivitamin with minerals  1 tablet Oral Daily  . nicotine  14 mg Transdermal Daily  . sodium chloride flush  3 mL Intravenous Q12H  . spironolactone  12.5 mg Oral Daily   Continuous Infusions: . sodium chloride       LOS: 2 days   Time spent: 25 minutes.  Patrecia Pour, MD [facility] [email or web address] Password [email or web address] 06/22/2017, 11:31 AM

## 2017-06-22 NOTE — Progress Notes (Signed)
Pt complaining of dizziness and persistent headache. Orthostatic VS done and MD notified of pt complaints. PRN medication ordered given for headache. Will continue to monitor.

## 2017-06-22 NOTE — Consult Note (Addendum)
Cardiology Consultation:   Patient ID: Sabrina Holland; [id number]; [birth year]   Admit date: 06/20/2017 Date of Consult: 06/22/2017  Primary Care Provider: Patient, No Pcp Per Primary Cardiologist: New to [place] Primary Electrophysiologist:  None   Patient Profile:   Sabrina Holland is a 46 y.o. female with a PMH of poorly controlled HTN, invasive ductal carcinoma s/p L mastectomy (2008), chemo, and tamoxifen, COPD, TIA, tobacco abuse, and polysubstance abuse (cocaine, marijuana, and opioids) who is being seen today for the evaluation of acute combined CHF at the request of Dr. Bonner Puna.  History of Present Illness:   [name] was in her usual state of health until about 1 month ago when she developed shortness of breath. She was diagnosed with PNA in April and initially felt better after completing her antibiotics course, however began experiencing progressive SOB and DOE. She reports using her inhaler for COPD every 1-2 hours with no relief of symptoms. She reports having a dry non-productive cough. She also noted left sided chest pain worse with coughing/deep breathing which she attributed to accessory muscle use. Over the past 3 days her symptoms have significantly worsened significantly. She reports PND, 4 pillow orthopnea, weakness, nausea, and diaphoresis. She presented to the ED 06/19/17 with c/o SOB, however left AMA prior to evaluation after refusing further attempts at IV access. Given persistent symptoms, she re-presented to the ED 06/20/17 and was agreeable to admission.   She denies prior cardiac history. She had a normal echocardiogram in 2013 as part of a work-up for TIA. She has never had an ischemic evaluation with either a stress test or cardiac catheterization. She denies family history of CAD or CHF. She reports smoking 1-2 cigarettes per day. Her Utox this admission was positive for opiates, cocaine, and marijuana. She admits to using cocaine last 1 week  ago and generally uses 1x/mo.   At the time of this evaluation, she was feeling significantly better. She reports being able to breathe comfortably while laying on her back. She feels her breathing is back to baseline. She states her chest pain resolved after receiving tramadol in the ED and has not reoccurred. She denies recent fever, LE edema, or abdominal pain  Hospital course: VSS. Labs notable for electrolytes wnl, Cr 0.81>1.04, Hgb 11.5, PLT 297, BNP 1844, Trop 0.05>0.04>0.03, LDL 80, anemia w/u wnl, TSH wnl, Ddimer 2.47. CT Chest with no PE but revealed pulmonary edema. EKG with sinus tachycardia, LVH, biatrial enlargement, and QTc 492; no STE/D. Echo revealed EF 25-30%, diffuse hypokinesis, G3DD, severely dilated LA, mildly dilated RV with mild reduction in systolic function. She was started on IV lasix 40mg  daily for diuresis, and bidil and lisinopril for acute combined CHF. Cardiology asked to evaluate for acute combined CHF.  Past Medical History:  Diagnosis Date  . Breast cancer ([place])   . Breast cancer, stage 2 ([place]) 02/23/2011  . COPD (chronic obstructive pulmonary disease) ([place])   . Hypertension   . TIA (transient ischemic attack) 11/25/2011   "first time" (11/25/2011)    Past Surgical History:  Procedure Laterality Date  . BREAST BIOPSY  2007   left  . MASTECTOMY  09/2005   left  . TONSILLECTOMY AND ADENOIDECTOMY     "when I was little" (11/25/2011)  . TUBAL LIGATION  1993     Home Medications:  Prior to Admission medications   Medication Sig Start Date End Date Taking? Authorizing Provider  amLODipine (NORVASC) 5 MG tablet Take 1 tablet (5 mg  total) by mouth daily. 04/20/17  Yes Isla Pence, MD  Multiple Vitamin (MULTIVITAMIN WITH MINERALS) TABS tablet Take 1 tablet by mouth daily.   Yes [provider]    Inpatient Medications: Scheduled Meds: . aspirin EC  81 mg Oral Daily  . atorvastatin  40 mg Oral q1800  . enoxaparin (LOVENOX) injection  40 mg  Subcutaneous Q24H  . feeding supplement (ENSURE ENLIVE)  237 mL Oral BID BM  . isosorbide-hydrALAZINE  1 tablet Oral TID  . lisinopril  2.5 mg Oral Daily  . multivitamin with minerals  1 tablet Oral Daily  . nicotine  14 mg Transdermal Daily  . sodium chloride flush  3 mL Intravenous Q12H   Continuous Infusions: . sodium chloride     PRN Meds: sodium chloride, acetaminophen, diphenhydrAMINE, ondansetron (ZOFRAN) IV, sodium chloride flush, traMADol  Allergies:    Allergies  Allergen Reactions  . Penicillins Other (See Comments)    Pt had slight throat swelling Has patient had a PCN reaction causing immediate rash, facial/tongue/throat swelling, SOB or lightheadedness with hypotension: No Has patient had a PCN reaction causing severe rash involving mucus membranes or skin necrosis: No Has patient had a PCN reaction that required hospitalization No Has patient had a PCN reaction occurring within the last 10 years: No If all of the above answers are "NO", then may proceed with Cephalosporin use.     Social History:   Social History   Socioeconomic History  . Marital status: Single    Spouse name: Not on file  . Number of children: 4  . Years of education: Not on file  . Highest education level: Not on file  Occupational History    Employer: [employer]  Social Needs  . Financial resource strain: Not on file  . Food insecurity:    Worry: Not on file    Inability: Not on file  . Transportation needs:    Medical: Not on file    Non-medical: Not on file  Tobacco Use  . Smoking status: Current Every Day Smoker    Packs/day: 0.00    Years: 20.00    Pack years: 0.00    Types: Cigarettes  . Smokeless tobacco: Never Used  . Tobacco comment: 1 or 2 cigarettes a day- approx 1 pack week  Substance and Sexual Activity  . Alcohol use: Yes    Alcohol/week: 7.2 oz    Types: 12 Cans of beer per week    Comment: occasionally  . Drug use: Yes    Types: Marijuana, Cocaine     Comment: marijuana use 2 blunts a week- less than an oz /week  . Sexual activity: Yes    Birth control/protection: Surgical  Lifestyle  . Physical activity:    Days per week: Not on file    Minutes per session: Not on file  . Stress: Not on file  Relationships  . Social connections:    Talks on phone: Not on file    Gets together: Not on file    Attends religious service: Not on file    Active member of club or organization: Not on file    Attends meetings of clubs or organizations: Not on file    Relationship status: Not on file  . Intimate partner violence:    Fear of current or ex partner: Not on file    Emotionally abused: Not on file    Physically abused: Not on file    Forced sexual activity: Not on file  Other  Topics Concern  . Not on file  Social History Narrative   ** Merged History Encounter **        Family History:    Family History  Problem Relation Age of Onset  . Heart disease Mother   . Sickle cell anemia Cousin        maternal cousin     ROS:  Please see the history of present illness.   All other ROS reviewed and negative.     Physical Exam/Data:   Vitals:   06/21/17 0553 06/21/17 1306 06/21/17 2145 06/22/17 0522  BP:  112/75 105/70 113/89  Pulse:  93 89 87  Resp:  (!) 24 20 18   Temp:  97.6 F (36.4 C) 97.9 F (36.6 C) (!) 97.3 F (36.3 C)  TempSrc:  Oral Oral Oral  SpO2:  96% 96% 99%  Weight: 136 lb 6.4 oz (61.9 kg)   134 lb 0.6 oz (60.8 kg)  Height:        Intake/Output Summary (Last 24 hours) at 06/22/2017 0747 Last data filed at 06/22/2017 0630 Gross per 24 hour  Intake 1022 ml  Output 2350 ml  Net -1328 ml   Filed Weights   06/20/17 1948 06/21/17 0553 06/22/17 0522  Weight: 138 lb 9.6 oz (62.9 kg) 136 lb 6.4 oz (61.9 kg) 134 lb 0.6 oz (60.8 kg)   Body mass index is 22.31 kg/m.  General:  AAF who appears older than stated age, laying in bed in no acute distress HEENT: sclera anicteric  Neck: no JVD Vascular: No carotid  bruits; distal pulses 2+ bilaterally Cardiac:  normal S1, S2; RRR; no murmurs or rubs; +gallop Lungs:  clear to auscultation bilaterally, no wheezing, rhonchi or rales  Abd: NABS, soft, nontender, no hepatomegaly Ext: no edema Musculoskeletal:  No deformities, BUE and BLE strength normal and equal Skin: warm and dry  Neuro:  CNs 2-12 intact, no focal abnormalities noted Psych:  Normal affect   EKG:  The EKG was personally reviewed and demonstrates:  sinus tachycardia, LVH, biatrial enlargement, and QTc 492; no STE/D Telemetry:  Telemetry was personally reviewed and demonstrates:  NSR  Relevant CV Studies:  Echocardiogram 06/21/17: Study Conclusions  - Left ventricle: The cavity size was normal. Wall thickness was   normal. Systolic function was severely reduced. The estimated   ejection fraction was in the range of 25% to 30%. Abnormal GLS -   reported to be -12.5%. Diffuse hypokinesis. Doppler parameters   are consistent with restrictive left ventricular relaxation   (grade 3 diastolic dysfunction). The E/e&' ratio is >15,   suggesting elevated LV filling pressure. - Aorta: Ascending aortic diameter: 38 mm (S). - Ascending aorta: The ascending aorta was mildly dilated. - Mitral valve: Mildly thickened leaflets . There was mild to   moderate regurgitation. - Left atrium: Severely dilated. - Right ventricle: The cavity size was mildly dilated. Mildly   reduced systolic function. - Right atrium: The atrium was mildly dilated. - Atrial septum: Bows from left to right, suggesting high LA > RA   pressure. - Tricuspid valve: There was mild regurgitation. - Pulmonary arteries: PA peak pressure: 30 mm Hg (S). - Inferior vena cava: The vessel was normal in size. The   respirophasic diameter changes were in the normal range (>= 50%),   consistent with normal central venous pressure.  Impressions:  - LVEF 25-30%, normal wall thickness, severe global hypokinesis,   grade 3 DD with  elevated LV filling pressure, dilated ascending  aorta to 3.8 cm, mild to moderate MR, severe LAE, mild RVE with   mildly reduced systolic function, mild LAE, mild TR, RVSP 30   mmHg, normal IVC.  Laboratory Data:  Chemistry Recent Labs  Lab 06/20/17 1338 06/21/17 0519 06/22/17 0509  NA 143 140 142  K 4.0 3.8 3.5  CL 110 106 103  CO2 27 25 29   GLUCOSE 124* 138* 100*  BUN 13 12 21*  CREATININE 0.95 0.81 1.04*  CALCIUM 9.2 9.2 9.0  GFRNONAA >60 >60 >60  GFRAA >60 >60 >60  ANIONGAP 6 9 10     Recent Labs  Lab 06/20/17 1338  PROT 7.1  ALBUMIN 3.8  AST 29  ALT 21  ALKPHOS 64  BILITOT 0.9   Hematology Recent Labs  Lab 06/20/17 1338 06/21/17 0519  WBC 7.6 5.9  RBC 4.11 4.12  HGB 11.3* 11.5*  HCT 35.6* 34.9*  MCV 86.6 84.7  MCH 27.5 27.9  MCHC 31.7 33.0  RDW 14.7 14.6  PLT 271 297   Cardiac Enzymes Recent Labs  Lab 06/20/17 1850 06/20/17 2331 06/21/17 0519  TROPONINI 0.05* 0.04* 0.03*    Recent Labs  Lab 06/20/17 1409  TROPIPOC 0.02    BNP Recent Labs  Lab 06/20/17 1338 06/21/17 0519  BNP 1,658.0* 1,844.5*    DDimer  Recent Labs  Lab 06/20/17 1338  DDIMER 2.47*    Radiology/Studies:  Dg Chest 2 View  Result Date: 06/20/2017 CLINICAL DATA:  Shortness of breath and chest pain EXAM: CHEST - 2 VIEW COMPARISON:  May 12, 2017 FINDINGS: There is mild interstitial thickening in the mid lower lung zones. There is no appreciable edema or consolidation. Heart is borderline enlarged with pulmonary vascularity within normal limits. No adenopathy. No bone lesions. No pneumothorax. IMPRESSION: Areas of mild interstitial thickening, likely indicative of chronic inflammatory type change. No edema or consolidation. Heart borderline enlarged with pulmonary vascularity normal. Electronically Signed   By: Lowella Grip III M.D.   On: 06/20/2017 14:22   Ct Angio Chest Pe W And/or Wo Contrast  Result Date: 06/20/2017 CLINICAL DATA:  Shortness of breath. EXAM:  CT ANGIOGRAPHY CHEST WITH CONTRAST TECHNIQUE: Multidetector CT imaging of the chest was performed using the standard protocol during bolus administration of intravenous contrast. Multiplanar CT image reconstructions and MIPs were obtained to evaluate the vascular anatomy. CONTRAST:  149mL ISOVUE-370 IOPAMIDOL (ISOVUE-370) INJECTION 76% COMPARISON:  Radiographs of same day.  CT scan of March 21, 2008. FINDINGS: Cardiovascular: Satisfactory opacification of the pulmonary arteries to the segmental level. No evidence of pulmonary embolism. Mild cardiomegaly is noted without pericardial effusion. No pericardial effusion. Mediastinum/Nodes: No enlarged mediastinal, hilar, or axillary lymph nodes. Thyroid gland, trachea, and esophagus demonstrate no significant findings. Lungs/Pleura: No pneumothorax or pleural effusion is noted. Bulla formation is noted in both upper lobes. Mild septal thickening is noted throughout both lungs concerning for pulmonary edema. Upper Abdomen: No acute abnormality. Musculoskeletal: No chest wall abnormality. No acute or significant osseous findings. Review of the MIP images confirms the above findings. IMPRESSION: No definite evidence of pulmonary embolus. Bulla formation is noted in both upper lobes. Mild septal thickening is seen throughout both lungs concerning for pulmonary edema. Electronically Signed   By: Marijo Conception, M.D.   On: 06/20/2017 15:36    Assessment and Plan:   1. Acute combined CHF: patient presented with progressively worsening SOB/DOE. Also with left sided chest pain exacerbated by coughing/deep breathing. CT chest revealed pulmonary edema but no PE. BNP >1800.  TSH wnl. Echo with EF 25-30%, G3DD, diffuse hypokinesis, severe LAE, mild RVE w mildly reduced systolic function. She was started on IV lasix 40mg  daily with net -2.2L UOP in the last 24 hours. Weight 138lb>134lbs today. Her BUN/Cr are up today from 12/0.81>21/1.04 respectively today. Unclear cause at this  time but possible this is HTN mediated as BP has been poorly controlled, consistently with SBP >150 and DBP>90 over the last year. Differential also includes ischemic cause, or less likely chemotherapy induced cardiomyopathy although she is 5yr removed from doxorubicin/cyclophosphamide administration. - Would likely benefit from a definitive ischemic evaluation with a R/LHC.  - Agree with continuing Bidil and ACEi  - May benefit from [facility] although cost is likely to be an issue - Will start low dose spironolactone and monitor BP  - Avoid Bblocker given recent cocaine abuse.  - Will need a repeat echocardiogram in 3 months to monitor for response to medical management  2. Mild troponin elevation: trop trend 0.05>0.04>0.03. EKG without ischemic changes. Elevation not consistent with ACS. More likely demand ischemia in the setting of acute combined CHF.  - Ultimately would benefit from definitive ischemic evaluation with a R/LHC to determine cause of acute combined CHF.   3. HTN: BP improved this admission after starting bidil and lisinopril. - Continue current regimen - Will start low dose spironolactone  - Monitor BP closely   4. HLD: LDL 80 this admission; goal <70. Started on atorvastatin 40mg  daily - Continue statin  5. Polysubstance abuse: daily tobacco use, weekly cocaine, marijuana, and opiate abuse. Counseled on the importance of cessation of tobacco and illicit drugs - Continue to encourage cessation - Avoid Bblockers given cocaine use  6. History of Breast Cancer: follows outpatient with oncology (last seen 11/2016) - Continue routine monitoring    For questions or updates, please contact [place] Please consult [email or web address] for contact info under Cardiology/STEMI.   Signed, Abigail Butts, PA-C  06/22/2017 7:47 AM [phone]   I have seen and examined the patient along with Abigail Butts, PA.  I have reviewed the chart, notes and new data.  I agree with  PA/NP's note.  Key new complaints: Positive exertional dyspnea, eventually followed by PND and orthopnea has been gradual.  She does not have angina pectoris.  Substantial improvement following diuresis, now able to lie fully flat without complaints.  She noticed substantial weight gain before decompensation.  Limited coronary risk factors (hypertension, she smokes 1 or 2 cigarettes a day, had early menopause after institution of tamoxifen therapy for her breast cancer, no periods in the last roughly 9 or 10 years).  She does not have a family history of coronary disease and does not have diabetes mellitus.  She did not receive chest radiation therapy.  Although she received anthracycline-based chemotherapy for her breast cancer 10 years ago, she had normal left ventricular systolic function by echocardiogram performed in 2013 and had a normal heart size on the chest x-ray from January 2017. Key examination changes: Inferolaterally displaced apical impulse, hyperdynamic precordium, no significant murmurs S3 present, no JVD, no rales, no leg edema Key new findings / data: Echo shows a global pattern of left ventricular hypokinesis and overall in appearance more consistent with nonischemic cardiomyopathy.  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 Agree with instituted therapy for heart failure with loop diuretics, vasodilators, RAA S inhibitors. Until we show evidence of abstinence from cocaine, would not start beta-blockers.  When beta-blockers are instituted, carvedilol is preferred. Recommended complete abstinence from cocaine. Had a lengthy discussion regarding sodium dietary restriction, daily weight monitoring, signs and symptoms of heart failure exacerbation, how to deal with fluid gain, long-term prognosis of heart failure. Briefly discussed the increased risk of sudden arrhythmic death and the possible need for  defibrillator.  Again, would wait for evidence of compliance with medications and follow-up in at least 90 days of guideline based heart failure management before we decide whether she should get a defibrillator. The suspicion for coronary insufficiency is low, but if we have to make decisions such as defibrillator implantation, she will need some type of noninvasive evaluation for coronary disease, preferably coronary CT angiogram.  Sanda Klein, MD, [facility] [phone] 06/22/2017, 1:47 PM

## 2017-06-23 DIAGNOSIS — I5023 Acute on chronic systolic (congestive) heart failure: Secondary | ICD-10-CM

## 2017-06-23 DIAGNOSIS — D500 Iron deficiency anemia secondary to blood loss (chronic): Secondary | ICD-10-CM

## 2017-06-23 DIAGNOSIS — I5042 Chronic combined systolic (congestive) and diastolic (congestive) heart failure: Secondary | ICD-10-CM

## 2017-06-23 DIAGNOSIS — J810 Acute pulmonary edema: Secondary | ICD-10-CM

## 2017-06-23 LAB — BASIC METABOLIC PANEL
Anion gap: 8 (ref 5–15)
BUN: 18 mg/dL (ref 6–20)
CHLORIDE: 105 mmol/L (ref 101–111)
CO2: 27 mmol/L (ref 22–32)
Calcium: 9.1 mg/dL (ref 8.9–10.3)
Creatinine, Ser: 0.79 mg/dL (ref 0.44–1.00)
GFR calc non Af Amer: >60 mL/min (ref >60)
Glucose, Bld: 101 mg/dL — ABNORMAL HIGH (ref 65–99)
POTASSIUM: 3.6 mmol/L (ref 3.5–5.1)
SODIUM: 140 mmol/L (ref 135–145)

## 2017-06-23 MED ORDER — LISINOPRIL 2.5 MG PO TABS: 2.5000 mg | ORAL_TABLET | Freq: Every day | ORAL | 2 refills | Status: DC

## 2017-06-23 MED ORDER — LISINOPRIL 5 MG PO TABS: 5.0000 mg | ORAL_TABLET | Freq: Every day | ORAL | Status: DC

## 2017-06-23 MED ORDER — FUROSEMIDE 20 MG PO TABS: 20.0000 mg | ORAL_TABLET | Freq: Every day | ORAL | 1 refills | Status: DC

## 2017-06-23 MED ORDER — HYDRALAZINE HCL 25 MG PO TABS: 25.0000 mg | ORAL_TABLET | Freq: Three times a day (TID) | ORAL | 1 refills | Status: DC

## 2017-06-23 MED ORDER — LISINOPRIL 5 MG PO TABS: 5.0000 mg | ORAL_TABLET | Freq: Every day | ORAL | 2 refills | Status: DC

## 2017-06-23 MED ORDER — SPIRONOLACTONE 25 MG PO TABS: 12.5000 mg | ORAL_TABLET | Freq: Every day | ORAL | 2 refills | Status: DC

## 2017-06-23 MED ORDER — ASPIRIN 81 MG PO TBEC: 81.0000 mg | DELAYED_RELEASE_TABLET | Freq: Every day | ORAL | 0 refills | Status: DC

## 2017-06-23 MED ORDER — ISOSORBIDE DINITRATE 20 MG PO TABS
20.0000 mg | ORAL_TABLET | Freq: Two times a day (BID) | ORAL | Status: DC
  Administered 2017-06-23: 20 mg via ORAL
  Filled 2017-06-23: qty 1

## 2017-06-23 MED ORDER — SPIRONOLACTONE 25 MG PO TABS: 25.0000 mg | ORAL_TABLET | Freq: Every day | ORAL | 1 refills | Status: DC

## 2017-06-23 MED ORDER — ATORVASTATIN CALCIUM 40 MG PO TABS: 40.0000 mg | ORAL_TABLET | Freq: Every day | ORAL | 2 refills | Status: DC

## 2017-06-23 MED ORDER — NICOTINE 14 MG/24HR TD PT24: 14.0000 mg | MEDICATED_PATCH | Freq: Every day | TRANSDERMAL | 0 refills | Status: DC

## 2017-06-23 MED ORDER — ISOSORBIDE DINITRATE 20 MG PO TABS: 20.0000 mg | ORAL_TABLET | Freq: Two times a day (BID) | ORAL | 2 refills | Status: DC

## 2017-06-23 MED ORDER — SPIRONOLACTONE 25 MG PO TABS: 25.0000 mg | ORAL_TABLET | Freq: Every day | ORAL | Status: DC

## 2017-06-23 MED ORDER — HYDRALAZINE HCL 25 MG PO TABS
25.0000 mg | ORAL_TABLET | Freq: Three times a day (TID) | ORAL | Status: DC
  Administered 2017-06-23: 25 mg via ORAL
  Filled 2017-06-23: qty 1

## 2017-06-23 MED ORDER — FUROSEMIDE 20 MG PO TABS
20.0000 mg | ORAL_TABLET | Freq: Every day | ORAL | Status: DC
  Administered 2017-06-23: 20 mg via ORAL
  Filled 2017-06-23: qty 1

## 2017-06-23 MED FILL — SPIRONOLACTONE 25 MG TABLET: 25 | 30 days supply | Qty: 30 | Fill #0

## 2017-06-23 MED FILL — LISINOPRIL 5 MG TAB: 5 | 30 days supply | Qty: 30 | Fill #0

## 2017-06-23 MED FILL — FUROSEMIDE 20 MG TABLET: 20 | 30 days supply | Qty: 30 | Fill #0

## 2017-06-23 MED FILL — NICOTINE 14 MG/24HR PATCH: 14 | 28 days supply | Qty: 28 | Fill #0

## 2017-06-23 MED FILL — ATORVASTATIN CALCIUM 40 MG: 40 | 30 days supply | Qty: 30 | Fill #0

## 2017-06-23 MED FILL — hydrALAZINE HCL 25 MG TABS: 25 | 30 days supply | Qty: 90 | Fill #0

## 2017-06-23 NOTE — Discharge Instructions (Signed)
Coping with Quitting Smoking Quitting smoking is a physical and mental challenge. You will face cravings, withdrawal symptoms, and temptation. Before quitting, work with your health care provider to make a plan that can help you cope. Preparation can help you quit and keep you from giving in. How can I cope with cravings? Cravings usually last for 5-10 minutes. If you get through it, the craving will pass. Consider taking the following actions to help you cope with cravings:  Keep your mouth busy: ? Chew sugar-free gum. ? Suck on hard candies or a straw. ? Brush your teeth.  Keep your hands and body busy: ? Immediately change to a different activity when you feel a craving. ? Squeeze or play with a ball. ? Do an activity or a hobby, like making bead jewelry, practicing needlepoint, or working with wood. ? Mix up your normal routine. ? Take a short exercise break. Go for a quick walk or run up and down stairs. ? Spend time in public places where smoking is not allowed.  Focus on doing something kind or helpful for someone else.  Call a friend or family member to talk during a craving.  Join a support group.  Call a quit line, such as 1-800-QUIT-NOW.  Talk with your health care provider about medicines that might help you cope with cravings and make quitting easier for you.  How can I deal with withdrawal symptoms? Your body may experience negative effects as it tries to get used to not having nicotine in the system. These effects are called withdrawal symptoms. They may include:  Feeling hungrier than normal.  Trouble concentrating.  Irritability.  Trouble sleeping.  Feeling depressed.  Restlessness and agitation.  Craving a cigarette.  To manage withdrawal symptoms:  Avoid places, people, and activities that trigger your cravings.  Remember why you want to quit.  Get plenty of sleep.  Avoid coffee and other caffeinated drinks. These may worsen some of your  symptoms.  How can I handle social situations? Social situations can be difficult when you are quitting smoking, especially in the first few weeks. To manage this, you can:  Avoid parties, bars, and other social situations where people might be smoking.  Avoid alcohol.  Leave right away if you have the urge to smoke.  Explain to your family and friends that you are quitting smoking. Ask for understanding and support.  Plan activities with friends or family where smoking is not an option.  What are some ways I can cope with stress? Wanting to smoke may cause stress, and stress can make you want to smoke. Find ways to manage your stress. Relaxation techniques can help. For example:  Breathe slowly and deeply, in through your nose and out through your mouth.  Listen to soothing, relaxing music.  Talk with a family member or friend about your stress.  Light a candle.  Soak in a bath or take a shower.  Think about a peaceful place.  What are some ways I can prevent weight gain? Be aware that many people gain weight after they quit smoking. However, not everyone does. To keep from gaining weight, have a plan in place before you quit and stick to the plan after you quit. Your plan should include:  Having healthy snacks. When you have a craving, it may help to: ? Eat plain popcorn, crunchy carrots, celery, or other cut vegetables. ? Chew sugar-free gum.  Changing how you eat: ? Eat small portion sizes at meals. ?  Eat 4-6 small meals throughout the day instead of 1-2 large meals a day. ? Be mindful when you eat. Do not watch television or do other things that might distract you as you eat.  Exercising regularly: ? Make time to exercise each day. If you do not have time for a long workout, do short bouts of exercise for 5-10 minutes several times a day. ? Do some form of strengthening exercise, like weight lifting, and some form of aerobic exercise, like running or  swimming.  Drinking plenty of water or other low-calorie or no-calorie drinks. Drink 6-8 glasses of water daily, or as much as instructed by your health care provider.  Summary  Quitting smoking is a physical and mental challenge. You will face cravings, withdrawal symptoms, and temptation to smoke again. Preparation can help you as you go through these challenges.  You can cope with cravings by keeping your mouth busy (such as by chewing gum), keeping your body and hands busy, and making calls to family, friends, or a helpline for people who want to quit smoking.  You can cope with withdrawal symptoms by avoiding places where people smoke, avoiding drinks with caffeine, and getting plenty of rest.  Ask your health care provider about the different ways to prevent weight gain, avoid stress, and handle social situations. This information is not intended to replace advice given to you by your health care provider. Make sure you discuss any questions you have with your health care provider. Document Released: 01/01/2016 Document Revised: 01/01/2016 Document Reviewed: 01/01/2016 Elsevier Interactive Patient Education  2018 [employer]. Nicotine skin patches What is this medicine? NICOTINE ([place]) helps people stop smoking. The patches replace the nicotine found in cigarettes and help to decrease withdrawal effects. They are most effective when used in combination with a stop-smoking program. This medicine may be used for other purposes; ask your health care provider or pharmacist if you have questions. COMMON BRAND NAME(S): Habitrol, Nicoderm CQ, Nicotrol What should I tell my health care provider before I take this medicine? They need to know if you have any of these conditions: -diabetes -heart disease, angina, irregular heartbeat or previous heart attack -high blood pressure -lung disease, including asthma -overactive thyroid -pheochromocytoma -seizures or a history of  seizures -skin problems, like eczema -stomach problems or ulcers -an unusual or allergic reaction to nicotine, adhesives, other medicines, foods, dyes, or preservatives -pregnant or trying to get pregnant -breast-feeding How should I use this medicine? This medicine is for use on the skin. Follow the directions that come with the patches. Find an area of skin on your upper arm, chest, or back that is clean, dry, greaseless, undamaged and hairless. Wash hands with plain soap and water. Do not use anything that contains aloe, lanolin or glycerin as these may prevent the patch from sticking. Dry thoroughly. Remove the patch from the sealed pouch. Do not try to cut or trim the patch. Using your palm, press the patch firmly in place for 10 seconds to make sure that there is good contact with your skin. After applying the patch, wash your hands. Change the patch every day, keeping to a regular schedule. When you apply a new patch, use a new area of skin. Wait at least 1 week before using the same area again. Talk to your pediatrician regarding the use of this medicine in children. Special care may be needed. Overdosage: If you think you have taken too much of this medicine contact a poison  control center or emergency room at once. NOTE: This medicine is only for you. Do not share this medicine with others. What if I miss a dose? If you forget to replace a patch, use it as soon as you can. Only use one patch at a time and do not leave on the skin for longer than directed. If a patch falls off, you can replace it, but keep to your schedule and remove the patch at the right time. What may interact with this medicine? -medicines for asthma -medicines for blood pressure -medicines for mental depression This list may not describe all possible interactions. Give your health care provider a list of all the medicines, herbs, non-prescription drugs, or dietary supplements you use. Also tell them if you smoke, drink  alcohol, or use illegal drugs. Some items may interact with your medicine. What should I watch for while using this medicine? You should begin using the nicotine patch the day you stop smoking. It is okay if you do not succeed at your attempt to quit and have a cigarette. You can still continue your quit attempt and keep using the product as directed. Just throw away your cigarettes and get back to your quit plan. You can keep the patch in place during swimming, bathing, and showering. If your patch falls off during these activities, replace it. When you first apply the patch, your skin may itch or burn. This should go away soon. When you remove a patch, the skin may look red, but this should only last for a few days. Call your doctor or health care professional if skin redness does not go away after 4 days, if your skin swells, or if you get a rash. If you are a diabetic and you quit smoking, the effects of insulin may be increased and you may need to reduce your insulin dose. Check with your doctor or health care professional about how you should adjust your insulin dose. If you are going to have a magnetic resonance imaging (MRI) procedure, tell your MRI technician if you have this patch on your body. It must be removed before a MRI. What side effects may I notice from receiving this medicine? Side effects that you should report to your doctor or health care professional as soon as possible: -allergic reactions like skin rash, itching or hives, swelling of the face, lips, or tongue -breathing problems -changes in hearing -changes in vision -chest pain -cold sweats -confusion -fast, irregular heartbeat -feeling faint or lightheaded, falls -headache -increased saliva -skin redness that lasts more than 4 days -stomach pain -signs and symptoms of nicotine overdose like nausea; vomiting; dizziness; weakness; and rapid heartbeat Side effects that usually do not require medical attention (report  to your doctor or health care professional if they continue or are bothersome): -diarrhea -dry mouth -hiccups -irritability -nervousness or restlessness -trouble sleeping or vivid dreams This list may not describe all possible side effects. Call your doctor for medical advice about side effects. You may report side effects to FDA at 1-800-FDA-1088. Where should I keep my medicine? Keep out of the reach of children. Store at room temperature between 20 and 25 degrees C (68 and 77 degrees F). Protect from heat and light. Store in International aid/development worker until ready to use. Throw away unused medicine after the expiration date. When you remove a patch, fold with sticky sides together; put in an empty opened pouch and throw away. NOTE: This sheet is a summary. It may not cover all possible  information. If you have questions about this medicine, talk to your doctor, pharmacist, or health care provider.  2018 Elsevier/Gold Standard (2013-12-02 15:46:21) Spironolactone tablets What is this medicine? SPIRONOLACTONE ([place] tone) is a diuretic. It helps you make more urine and to lose excess water from your body. This medicine is used to treat high blood pressure, and edema or swelling from heart, kidney, or liver disease. It is also used to treat patients who make too much aldosterone or have low potassium. This medicine may be used for other purposes; ask your health care provider or pharmacist if you have questions. COMMON BRAND NAME(S): Aldactone What should I tell my health care provider before I take this medicine? They need to know if you have any of these conditions: -high blood level of potassium -kidney disease or trouble making urine -liver disease -an unusual or allergic reaction to spironolactone, other medicines, foods, dyes, or preservatives -pregnant or trying to get pregnant -breast-feeding How should I use this medicine? Take this medicine by mouth with a drink of water.  Follow the directions on your prescription label. You can take it with or without food. If it upsets your stomach, take it with food. Do not take your medicine more often than directed. Remember that you will need to pass more urine after taking this medicine. Do not take your doses at a time of day that will cause you problems. Do not take at bedtime. Talk to your pediatrician regarding the use of this medicine in children. While this drug may be prescribed for selected conditions, precautions do apply. Overdosage: If you think you have taken too much of this medicine contact a poison control center or emergency room at once. NOTE: This medicine is only for you. Do not share this medicine with others. What if I miss a dose? If you miss a dose, take it as soon as you can. If it is almost time for your next dose, take only that dose. Do not take double or extra doses. What may interact with this medicine? Do not take this medicine with any of the following medications: -eplerenone This medicine may also interact with the following medications: -corticosteroids -digoxin -lithium -medicines for high blood pressure like ACE inhibitors -skeletal muscle relaxants like tubocurarine -NSAIDs, medicines for pain and inflammation, like ibuprofen or naproxen -potassium products like salt substitute or supplements -pressor amines like norepinephrine -some diuretics This list may not describe all possible interactions. Give your health care provider a list of all the medicines, herbs, non-prescription drugs, or dietary supplements you use. Also tell them if you smoke, drink alcohol, or use illegal drugs. Some items may interact with your medicine. What should I watch for while using this medicine? Visit your doctor or health care professional for regular checks on your progress. Check your blood pressure as directed. Ask your doctor what your blood pressure should be, and when you should contact them. You  may need to be on a special diet while taking this medicine. Ask your doctor. Also, ask how many glasses of fluid you need to drink a day. You must not get dehydrated. This medicine may make you feel confused, dizzy or lightheaded. Drinking alcohol and taking some medicines can make this worse. Do not drive, use machinery, or do anything that needs mental alertness until you know how this medicine affects you. Do not sit or stand up quickly. What side effects may I notice from receiving this medicine? Side effects that you should  report to your doctor or health care professional as soon as possible: -allergic reactions such as skin rash or itching, hives, swelling of the lips, mouth, tongue, or throat -black or tarry stools -fast, irregular heartbeat -fever -muscle pain, cramps -numbness, tingling in hands or feet -trouble breathing -trouble passing urine -unusual bleeding -unusually weak or tired Side effects that usually do not require medical attention (report to your doctor or health care professional if they continue or are bothersome): -change in voice or hair growth -confusion -dizzy, drowsy -dry mouth, increased thirst -enlarged or tender breasts -headache -irregular menstrual periods -sexual difficulty, unable to have an erection -stomach upset This list may not describe all possible side effects. Call your doctor for medical advice about side effects. You may report side effects to FDA at 1-800-FDA-1088. Where should I keep my medicine? Keep out of the reach of children. Store below 25 degrees C (77 degrees F). Throw away any unused medicine after the expiration date. NOTE: This sheet is a summary. It may not cover all possible information. If you have questions about this medicine, talk to your doctor, pharmacist, or health care provider.  2018 Elsevier/Gold Standard (2009-09-15 12:51:30) Lisinopril tablets What is this medicine? LISINOPRIL ([place] pril) is an ACE  inhibitor. This medicine is used to treat high blood pressure and heart failure. It is also used to protect the heart immediately after a heart attack. This medicine may be used for other purposes; ask your health care provider or pharmacist if you have questions. COMMON BRAND NAME(S): Prinivil, Zestril What should I tell my health care provider before I take this medicine? They need to know if you have any of these conditions: -diabetes -heart or blood vessel disease -kidney disease -low blood pressure -previous swelling of the tongue, face, or lips with difficulty breathing, difficulty swallowing, hoarseness, or tightening of the throat -an unusual or allergic reaction to lisinopril, other ACE inhibitors, insect venom, foods, dyes, or preservatives -pregnant or trying to get pregnant -breast-feeding How should I use this medicine? Take this medicine by mouth with a glass of water. Follow the directions on your prescription label. You may take this medicine with or without food. If it upsets your stomach, take it with food. Take your medicine at regular intervals. Do not take it more often than directed. Do not stop taking except on your doctor's advice. Talk to your pediatrician regarding the use of this medicine in children. Special care may be needed. While this drug may be prescribed for children as young as 67 years of age for selected conditions, precautions do apply. Overdosage: If you think you have taken too much of this medicine contact a poison control center or emergency room at once. NOTE: This medicine is only for you. Do not share this medicine with others. What if I miss a dose? If you miss a dose, take it as soon as you can. If it is almost time for your next dose, take only that dose. Do not take double or extra doses. What may interact with this medicine? Do not take this medicine with any of the following medications: -hymenoptera venom -sacubitril; valsartan This  medicines may also interact with the following medications: -aliskiren -angiotensin receptor blockers, like losartan or valsartan -certain medicines for diabetes -diuretics -everolimus -gold compounds -lithium -NSAIDs, medicines for pain and inflammation, like ibuprofen or naproxen -potassium salts or supplements -salt substitutes -sirolimus -temsirolimus This list may not describe all possible interactions. Give your health care provider a  list of all the medicines, herbs, non-prescription drugs, or dietary supplements you use. Also tell them if you smoke, drink alcohol, or use illegal drugs. Some items may interact with your medicine. What should I watch for while using this medicine? Visit your doctor or health care professional for regular check ups. Check your blood pressure as directed. Ask your doctor what your blood pressure should be, and when you should contact him or her. Do not treat yourself for coughs, colds, or pain while you are using this medicine without asking your doctor or health care professional for advice. Some ingredients may increase your blood pressure. Women should inform their doctor if they wish to become pregnant or think they might be pregnant. There is a potential for serious side effects to an unborn child. Talk to your health care professional or pharmacist for more information. Check with your doctor or health care professional if you get an attack of severe diarrhea, nausea and vomiting, or if you sweat a lot. The loss of too much body fluid can make it dangerous for you to take this medicine. You may get drowsy or dizzy. Do not drive, use machinery, or do anything that needs mental alertness until you know how this drug affects you. Do not stand or sit up quickly, especially if you are an older patient. This reduces the risk of dizzy or fainting spells. Alcohol can make you more drowsy and dizzy. Avoid alcoholic drinks. Avoid salt substitutes unless you are  told otherwise by your doctor or health care professional. What side effects may I notice from receiving this medicine? Side effects that you should report to your doctor or health care professional as soon as possible: -allergic reactions like skin rash, itching or hives, swelling of the hands, feet, face, lips, throat, or tongue -breathing problems -signs and symptoms of kidney injury like trouble passing urine or change in the amount of urine -signs and symptoms of increased potassium like muscle weakness; chest pain; or fast, irregular heartbeat -signs and symptoms of liver injury like dark yellow or brown urine; general ill feeling or flu-like symptoms; light-colored stools; loss of appetite; nausea; right upper belly pain; unusually weak or tired; yellowing of the eyes or skin -signs and symptoms of low blood pressure like dizziness; feeling faint or lightheaded, falls; unusually weak or tired -stomach pain with or without nausea and vomiting Side effects that usually do not require medical attention (report to your doctor or health care professional if they continue or are bothersome): -changes in taste -cough -dizziness -fever -headache -sensitivity to light This list may not describe all possible side effects. Call your doctor for medical advice about side effects. You may report side effects to FDA at 1-800-FDA-1088. Where should I keep my medicine? Keep out of the reach of children. Store at room temperature between 15 and 30 degrees C (59 and 86 degrees F). Protect from moisture. Keep container tightly closed. Throw away any unused medicine after the expiration date. NOTE: This sheet is a summary. It may not cover all possible information. If you have questions about this medicine, talk to your doctor, pharmacist, or health care provider.  2018 Elsevier/Gold Standard (2015-02-23 12:52:35) Isosorbide Dinitrate, ISDN tablets What is this medicine? ISOSORBIDE DINITRATE ([place] SOR  bide dye [place] trate) is a type of vasodilator. It relaxes blood vessels, increasing the blood and oxygen supply to your heart. This medicine is used to prevent chest pain caused by angina. It will not help to stop  an episode of chest pain. This medicine may be used for other purposes; ask your health care provider or pharmacist if you have questions. COMMON BRAND NAME(S): Isordil Titradose, Sorbitrate, Wesorbide What should I tell my health care provider before I take this medicine? They need to know if you have any of these conditions: -previous heart attack or heart failure -an unusual or allergic reaction to isosorbide dinitrate, nitrates, other medicines, foods, dyes, or preservatives -pregnant or trying to get pregnant -breast-feeding How should I use this medicine? Take this medicine by mouth with a glass of water. Follow the directions on the prescription label. Take this medicine on an empty stomach, at least 30 minutes before or 2 hours after food. Do not take with food. Take your medicine at regular intervals. Do not take your medicine more often than directed. Do not stop taking this medicine suddenly or your symptoms may get worse. Ask your doctor or health care professional how to gradually reduce the dose. Talk to your pediatrician regarding the use of this medicine in children. Special care may be needed. Overdosage: If you think you have taken too much of this medicine contact a poison control center or emergency room at once. NOTE: This medicine is only for you. Do not share this medicine with others. What if I miss a dose? If you miss a dose, take it as soon as you can. If it is almost time for your next dose, take only that dose. Do not take double or extra doses. What may interact with this medicine? Do not take this medicine with any of the following medications: -medicines used to treat erectile dysfunction (ED) like avanafil, sildenafil, tadalafil, and  vardenafil -riociguat This medicine may also interact with the following medications: -medicines for high blood pressure -other medicines for angina or heart failure This list may not describe all possible interactions. Give your health care provider a list of all the medicines, herbs, non-prescription drugs, or dietary supplements you use. Also tell them if you smoke, drink alcohol, or use illegal drugs. Some items may interact with your medicine. What should I watch for while using this medicine? Check your heart rate and blood pressure regularly while you are taking this medicine. Ask your doctor or health care professional what your heart rate and blood pressure should be and when you should contact him or her. Tell your doctor or health care professional if you feel your medicine is no longer working. You may get dizzy. Do not drive, use machinery, or do anything that needs mental alertness until you know how this medicine affects you. To reduce the risk of dizzy or fainting spells, do not sit or stand up quickly, especially if you are an older patient. Alcohol can make you more dizzy, and increase flushing and rapid heartbeats. Avoid alcoholic drinks. Do not treat yourself for coughs, colds, or pain while you are taking this medicine without asking your doctor or health care professional for advice. Some ingredients may increase your blood pressure. What side effects may I notice from receiving this medicine? Side effects that you should report to your doctor or health care professional as soon as possible: -bluish discoloration of lips, fingernails, or palms of hands -irregular heartbeat, palpitations -low blood pressure -nausea, vomiting -persistent headache -unusually weak or tired Side effects that usually do not require medical attention (report to your doctor or health care professional if they continue or are bothersome): -flushing of the face or neck -rash This list may not  describe all possible side effects. Call your doctor for medical advice about side effects. You may report side effects to FDA at 1-800-FDA-1088. Where should I keep my medicine? Keep out of the reach of children. Store at room temperature, approximately 25 degrees C (77 degrees F). Protect from light. Keep container tightly closed. Throw away any unused medicine after the expiration date. NOTE: This sheet is a summary. It may not cover all possible information. If you have questions about this medicine, talk to your doctor, pharmacist, or health care provider.  2018 Elsevier/Gold Standard (2012-11-02 14:47:34) Furosemide tablets What is this medicine? FUROSEMIDE (fyoor [place] se mide) is a diuretic. It helps you make more urine and to lose salt and excess water from your body. This medicine is used to treat high blood pressure, and edema or swelling from heart, kidney, or liver disease. This medicine may be used for other purposes; ask your health care provider or pharmacist if you have questions. COMMON BRAND NAME(S): Active-Medicated Specimen Kit, Delone, Diuscreen, Lasix, RX Specimen Collection Kit, Specimen Collection Kit, URINX Medicated Specimen Collection What should I tell my health care provider before I take this medicine? They need to know if you have any of these conditions: -abnormal blood electrolytes -diarrhea or vomiting -gout -heart disease -kidney disease, small amounts of urine, or difficulty passing urine -liver disease -thyroid disease -an unusual or allergic reaction to furosemide, sulfa drugs, other medicines, foods, dyes, or preservatives -pregnant or trying to get pregnant -breast-feeding How should I use this medicine? Take this medicine by mouth with a glass of water. Follow the directions on the prescription label. You may take this medicine with or without food. If it upsets your stomach, take it with food or milk. Do not take your medicine more often than directed.  Remember that you will need to pass more urine after taking this medicine. Do not take your medicine at a time of day that will cause you problems. Do not take at bedtime. Talk to your pediatrician regarding the use of this medicine in children. While this drug may be prescribed for selected conditions, precautions do apply. Overdosage: If you think you have taken too much of this medicine contact a poison control center or emergency room at once. NOTE: This medicine is only for you. Do not share this medicine with others. What if I miss a dose? If you miss a dose, take it as soon as you can. If it is almost time for your next dose, take only that dose. Do not take double or extra doses. What may interact with this medicine? -aspirin and aspirin-like medicines -certain antibiotics -chloral hydrate -cisplatin -cyclosporine -digoxin -diuretics -laxatives -lithium -medicines for blood pressure -medicines that relax muscles for surgery -methotrexate -NSAIDs, medicines for pain and inflammation like ibuprofen, naproxen, or indomethacin -phenytoin -steroid medicines like prednisone or cortisone -sucralfate -thyroid hormones This list may not describe all possible interactions. Give your health care provider a list of all the medicines, herbs, non-prescription drugs, or dietary supplements you use. Also tell them if you smoke, drink alcohol, or use illegal drugs. Some items may interact with your medicine. What should I watch for while using this medicine? Visit your doctor or health care professional for regular checks on your progress. Check your blood pressure regularly. Ask your doctor or health care professional what your blood pressure should be, and when you should contact him or her. If you are a diabetic, check your blood sugar as directed. You may need to  be on a special diet while taking this medicine. Check with your doctor. Also, ask how many glasses of fluid you need to drink a day.  You must not get dehydrated. You may get drowsy or dizzy. Do not drive, use machinery, or do anything that needs mental alertness until you know how this drug affects you. Do not stand or sit up quickly, especially if you are an older patient. This reduces the risk of dizzy or fainting spells. Alcohol can make you more drowsy and dizzy. Avoid alcoholic drinks. This medicine can make you more sensitive to the sun. Keep out of the sun. If you cannot avoid being in the sun, wear protective clothing and use sunscreen. Do not use sun lamps or tanning beds/booths. What side effects may I notice from receiving this medicine? Side effects that you should report to your doctor or health care professional as soon as possible: -blood in urine or stools -dry mouth -fever or chills -hearing loss or ringing in the ears -irregular heartbeat -muscle pain or weakness, cramps -skin rash -stomach upset, pain, or nausea -tingling or numbness in the hands or feet -unusually weak or tired -vomiting or diarrhea -yellowing of the eyes or skin Side effects that usually do not require medical attention (report to your doctor or health care professional if they continue or are bothersome): -headache -loss of appetite -unusual bleeding or bruising This list may not describe all possible side effects. Call your doctor for medical advice about side effects. You may report side effects to FDA at 1-800-FDA-1088. Where should I keep my medicine? Keep out of the reach of children. Store at room temperature between 15 and 30 degrees C (59 and 86 degrees F). Protect from light. Throw away any unused medicine after the expiration date. NOTE: This sheet is a summary. It may not cover all possible information. If you have questions about this medicine, talk to your doctor, pharmacist, or health care provider.  2018 Elsevier/Gold Standard (2014-03-26 13:49:50) Atorvastatin tablets What is this medicine? ATORVASTATIN (a TORE  va sta tin) is known as a HMG-CoA reductase inhibitor or 'statin'. It lowers the level of cholesterol and triglycerides in the blood. This drug may also reduce the risk of heart attack, stroke, or other health problems in patients with risk factors for heart disease. Diet and lifestyle changes are often used with this drug. This medicine may be used for other purposes; ask your health care provider or pharmacist if you have questions. COMMON BRAND NAME(S): Lipitor What should I tell my health care provider before I take this medicine? They need to know if you have any of these conditions: -frequently drink alcoholic beverages -history of stroke, TIA -kidney disease -liver disease -muscle aches or weakness -other medical condition -an unusual or allergic reaction to atorvastatin, other medicines, foods, dyes, or preservatives -pregnant or trying to get pregnant -breast-feeding How should I use this medicine? Take this medicine by mouth with a glass of water. Follow the directions on the prescription label. You can take this medicine with or without food. Take your doses at regular intervals. Do not take your medicine more often than directed. Talk to your pediatrician regarding the use of this medicine in children. While this drug may be prescribed for children as young as 59 years old for selected conditions, precautions do apply. Overdosage: If you think you have taken too much of this medicine contact a poison control center or emergency room at once. NOTE: This medicine is only for  you. Do not share this medicine with others. What if I miss a dose? If you miss a dose, take it as soon as you can. If it is almost time for your next dose, take only that dose. Do not take double or extra doses. What may interact with this medicine? Do not take this medicine with any of the following medications: -red yeast rice -telaprevir -telithromycin -voriconazole This medicine may also interact with  the following medications: -alcohol -antiviral medicines for HIV or AIDS -boceprevir -certain antibiotics like clarithromycin, erythromycin, troleandomycin -certain medicines for cholesterol like fenofibrate or gemfibrozil -cimetidine -clarithromycin -colchicine -cyclosporine -digoxin -female hormones, like estrogens or progestins and birth control pills -grapefruit juice -medicines for fungal infections like fluconazole, itraconazole, ketoconazole -niacin -rifampin -spironolactone This list may not describe all possible interactions. Give your health care provider a list of all the medicines, herbs, non-prescription drugs, or dietary supplements you use. Also tell them if you smoke, drink alcohol, or use illegal drugs. Some items may interact with your medicine. What should I watch for while using this medicine? Visit your doctor or health care professional for regular check-ups. You may need regular tests to make sure your liver is working properly. Tell your doctor or health care professional right away if you get any unexplained muscle pain, tenderness, or weakness, especially if you also have a fever and tiredness. Your doctor or health care professional may tell you to stop taking this medicine if you develop muscle problems. If your muscle problems do not go away after stopping this medicine, contact your health care professional. This drug is only part of a total heart-health program. Your doctor or a dietician can suggest a low-cholesterol and low-fat diet to help. Avoid alcohol and smoking, and keep a proper exercise schedule. Do not use this drug if you are pregnant or breast-feeding. Serious side effects to an unborn child or to an infant are possible. Talk to your doctor or pharmacist for more information. This medicine may affect blood sugar levels. If you have diabetes, check with your doctor or health care professional before you change your diet or the dose of your diabetic  medicine. If you are going to have surgery tell your health care professional that you are taking this drug. What side effects may I notice from receiving this medicine? Side effects that you should report to your doctor or health care professional as soon as possible: -allergic reactions like skin rash, itching or hives, swelling of the face, lips, or tongue -dark urine -fever -joint pain -muscle cramps, pain -redness, blistering, peeling or loosening of the skin, including inside the mouth -trouble passing urine or change in the amount of urine -unusually weak or tired -yellowing of eyes or skin Side effects that usually do not require medical attention (report to your doctor or health care professional if they continue or are bothersome): -constipation -heartburn -stomach gas, pain, upset This list may not describe all possible side effects. Call your doctor for medical advice about side effects. You may report side effects to FDA at 1-800-FDA-1088. Where should I keep my medicine? Keep out of the reach of children. Store at room temperature between 20 to 25 degrees C (68 to 77 degrees F). Throw away any unused medicine after the expiration date. NOTE: This sheet is a summary. It may not cover all possible information. If you have questions about this medicine, talk to your doctor, pharmacist, or health care provider.  2018 Elsevier/Gold Standard (2010-11-23 41:93:79)

## 2017-06-23 NOTE — Progress Notes (Addendum)
Progress Note  Patient Name: Sabrina Holland Date of Encounter: 06/23/2017  Primary Cardiologist: Sanda Klein, MD   Subjective   Feels well this morning. No new complaints. Denies SOB, CP, or palpitations.  Inpatient Medications    Scheduled Meds: . aspirin EC  81 mg Oral Daily  . atorvastatin  40 mg Oral q1800  . enoxaparin (LOVENOX) injection  40 mg Subcutaneous Q24H  . feeding supplement (ENSURE ENLIVE)  237 mL Oral BID BM  . furosemide  20 mg Oral Daily  . [START ON 06/24/2017] lisinopril  5 mg Oral Daily  . multivitamin with minerals  1 tablet Oral Daily  . nicotine  14 mg Transdermal Daily  . sodium chloride flush  3 mL Intravenous Q12H  . [START ON 06/24/2017] spironolactone  25 mg Oral Daily   Continuous Infusions: . sodium chloride     PRN Meds: sodium chloride, acetaminophen, albuterol, diphenhydrAMINE, ondansetron (ZOFRAN) IV, sodium chloride flush, traMADol   Vital Signs    Vitals:   06/22/17 2023 06/23/17 0632 06/23/17 0653 06/23/17 1112  BP: 100/76 (!) 116/93    Pulse: 91 91  (!) 110  Resp: 20 16    Temp: (!) 97.5 F (36.4 C) 97.8 F (36.6 C)    TempSrc: Oral Oral    SpO2: 100% 100%  98%  Weight:   132 lb 15 oz (60.3 kg)   Height:        Intake/Output Summary (Last 24 hours) at 06/23/2017 1119 Last data filed at 06/22/2017 2215 Gross per 24 hour  Intake 480 ml  Output 100 ml  Net 380 ml   Filed Weights   06/21/17 0553 06/22/17 0522 06/23/17 0653  Weight: 136 lb 6.4 oz (61.9 kg) 134 lb 0.6 oz (60.8 kg) 132 lb 15 oz (60.3 kg)    Telemetry    NSR with intermittent sinus tachycardia - Personally Reviewed   Physical Exam   GEN: Sitting on bedside in no acute distress.   Neck: No JVD, no carotid bruits Cardiac: RRR, no murmurs, rubs, or gallops.  Respiratory: Clear to auscultation bilaterally, no wheezes/ rales/ rhonchi GI: NABS, Soft, nontender, non-distended  MS: No edema; No deformity. Neuro:  Nonfocal, moving all extremities  spontaneously Psych: Normal affect   Labs    Chemistry Recent Labs  Lab 06/20/17 1338 06/21/17 0519 06/22/17 0509 06/23/17 0502  NA 143 140 142 140  K 4.0 3.8 3.5 3.6  CL 110 106 103 105  CO2 27 25 29 27   GLUCOSE 124* 138* 100* 101*  BUN 13 12 21* 18  CREATININE 0.95 0.81 1.04* 0.79  CALCIUM 9.2 9.2 9.0 9.1  PROT 7.1  --   --   --   ALBUMIN 3.8  --   --   --   AST 29  --   --   --   ALT 21  --   --   --   ALKPHOS 64  --   --   --   BILITOT 0.9  --   --   --   GFRNONAA >60 >60 >60 >60  GFRAA >60 >60 >60 >60  ANIONGAP 6 9 10 8      Hematology Recent Labs  Lab 06/20/17 1338 06/21/17 0519  WBC 7.6 5.9  RBC 4.11 4.12  HGB 11.3* 11.5*  HCT 35.6* 34.9*  MCV 86.6 84.7  MCH 27.5 27.9  MCHC 31.7 33.0  RDW 14.7 14.6  PLT 271 297    Cardiac Enzymes Recent Labs  Lab 06/20/17 1850 06/20/17 2331 06/21/17 0519  TROPONINI 0.05* 0.04* 0.03*    Recent Labs  Lab 06/20/17 1409  TROPIPOC 0.02     BNP Recent Labs  Lab 06/20/17 1338 06/21/17 0519  BNP 1,658.0* 1,844.5*     DDimer  Recent Labs  Lab 06/20/17 1338  DDIMER 2.47*     Radiology    No results found.  Cardiac Studies   Echocardiogram 06/21/17: Study Conclusions  - Left ventricle: The cavity size was normal. Wall thickness was normal. Systolic function was severely reduced. The estimated ejection fraction was in the range of 25% to 30%. Abnormal GLS - reported to be -12.5%. Diffuse hypokinesis. Doppler parameters are consistent with restrictive left ventricular relaxation (grade 3 diastolic dysfunction). The E/e&' ratio is >15, suggesting elevated LV filling pressure. - Aorta: Ascending aortic diameter: 38 mm (S). - Ascending aorta: The ascending aorta was mildly dilated. - Mitral valve: Mildly thickened leaflets . There was mild to moderate regurgitation. - Left atrium: Severely dilated. - Right ventricle: The cavity size was mildly dilated. Mildly reduced systolic  function. - Right atrium: The atrium was mildly dilated. - Atrial septum: Bows from left to right, suggesting high LA > RA pressure. - Tricuspid valve: There was mild regurgitation. - Pulmonary arteries: PA peak pressure: 30 mm Hg (S). - Inferior vena cava: The vessel was normal in size. The respirophasic diameter changes were in the normal range (>= 50%), consistent with normal central venous pressure.  Impressions:  - LVEF 25-30%, normal wall thickness, severe global hypokinesis, grade 3 DD with elevated LV filling pressure, dilated ascending aorta to 3.8 cm, mild to moderate MR, severe LAE, mild RVE with mildly reduced systolic function, mild LAE, mild TR, RVSP 30 mmHg, normal IVC.     Patient Profile     Sabrina Holland is a 47 y.o. 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  Assessment & Plan    1. Acute combined CHF: patient presented with progressively worsening SOB/DOE. Also with left sided chest pain exacerbated by coughing/deep breathing. CT chest revealed pulmonary edema but no PE. BNP >1800. TSH wnl. Echo with EF 25-30%, G3DD, diffuse hypokinesis, severe LAE, mild RVE w mildly reduced systolic function. She was diuresed with IV lasix and her symptoms improved.  Weight 138lb on admission >132lbs today. Suspect this is likely HTN mediated as BP has been poorly controlled, consistently with SBP >150 and DBP>90 over the last year.  - Will simplify regimen: lisinopril 5mg  daily, spironolactone 25mg  daily, and lasix 20mg  daily .  - Avoid Bblocker given recent cocaine abuse.  - Will need a repeat echocardiogram in 3 months to monitor for response to medical management - outpatient follow-up has been  arranged  2. Mild troponin elevation: trop trend 0.05>0.04>0.03. EKG without ischemic changes. Elevation not consistent with ACS. More likely demand ischemia in the setting of acute combined CHF.  - Will defer ischemic evaluation until repeat echocardiogram completed in 3 months. If no improvement in EF with appropriate medical therapy/compliance, will consider ischemic evaluation with a coronary CTA outpatient.   3. HTN: BP improved this admission after starting bidil, lisinopril, and spironolactone. - Continue current regimen - Monitor BP closely   4. HLD: LDL 80 this admission; goal <70. Started on atorvastatin  40mg  daily - Continue statin  5. Polysubstance abuse: daily tobacco use, weekly cocaine, marijuana, and opiate abuse. Counseled on the importance of cessation of tobacco and illicit drugs - Continue to encourage cessation - Avoid Bblockers given cocaine use  6. History of Breast Cancer: follows outpatient with oncology (last seen 11/2016) - Continue routine monitoring    For questions or updates, please contact [place] Please consult [email or web address] for contact info under Cardiology/STEMI.      Signed, Abigail Butts, PA-C  06/23/2017, 11:19 AM   [phone]  I have seen and examined the patient along with Abigail Butts, PA-C .  I have reviewed the chart, notes and new data.  I agree with PA/NP's note.  Key new complaints: feels well, no orthopnea or dyspnea with ambulation Key examination changes: No evidence of volume overload by exam (no rales, no S3, no JVD, no edema).  Blood pressure is relatively low. Key new findings / data: Remains in normal sinus rhythm.  Normal potassium and creatinine levels.  PLAN: We will try to simplify her heart failure medications to improve the odds of compliance and reduce cost. We will stop hydralazine/nitrates and increase the dose of ACE inhibitor and spironolactone. If she is compliant with follow-up and  abstinence from cocaine, will institute carvedilol as allowed by her blood pressure. Reevaluate left ventricular systolic function in 3 months, after we have reached maximum tolerated doses of medications for heart failure.  If there is not substantial improvement in left ventricular systolic function, will need evaluation for coronary insufficiency.  At this point, my suspicion for coronary artery disease is low in this relatively young woman without angina.  Her only real coronary risk factor (other than cocaine use) is hypertension.   Sanda Klein, MD, [place] [phone] 06/23/2017, 11:39 AM

## 2017-06-23 NOTE — Discharge Summary (Addendum)
Physician Discharge Summary  Caralyn Twining MRN: [id number] DOB/AGE: [birth year] 47 y.o.  PCP: Patient, No Pcp Per   Admit date: 06/20/2017 Discharge date: 06/23/2017  Discharge Diagnoses:    Principal Problem:   Acute exacerbation of CHF (congestive heart failure) ([place]) Active Problems:   Breast cancer, stage 2 (H[facility])   Vitamin B 12 deficiency   TIA (transient ischemic attack)   HTN (hypertension)   Polysubstance abuse ([place])   Anemia    Follow-up recommendations Follow-up with PCP in 3-5 days , including all  additional recommended appointments as below Follow-up CBC, CMP in 3-5 days Will need a repeat echocardiogram in 3 months to monitor for response to medical management Until we show evidence of abstinence from cocaine, would not start beta-blockers.  When beta-blockers are instituted, carvedilol is preferred. follow-up in at least 90 days of guideline based heart failure management before we decide whether she should get a defibrillator. she will need some type of noninvasive evaluation for coronary disease, preferably coronary CT angiogram     Allergies as of 06/23/2017      Reactions   Penicillins Other (See Comments)   Pt had slight throat swelling Has patient had a PCN reaction causing immediate rash, facial/tongue/throat swelling, SOB or lightheadedness with hypotension: No Has patient had a PCN reaction causing severe rash involving mucus membranes or skin necrosis: No Has patient had a PCN reaction that required hospitalization No Has patient had a PCN reaction occurring within the last 10 years: No If all of the above answers are "NO", then may proceed with Cephalosporin use.      Medication List    TAKE these medications   amLODipine 5 MG tablet Commonly known as:  NORVASC Take 1 tablet (5 mg total) by mouth daily.   aspirin 81 MG EC tablet Take 1 tablet (81 mg total) by mouth daily. Start taking on:  06/24/2017   atorvastatin 40 MG  tablet Commonly known as:  LIPITOR Take 1 tablet (40 mg total) by mouth daily at 6 PM.   furosemide 20 MG tablet Commonly known as:  LASIX Take 1 tablet (20 mg total) by mouth daily.   hydrALAZINE 25 MG tablet Commonly known as:  APRESOLINE Take 1 tablet (25 mg total) by mouth 3 (three) times daily.   isosorbide dinitrate 20 MG tablet Commonly known as:  ISORDIL Take 1 tablet (20 mg total) by mouth 2 (two) times daily.   lisinopril 5 MG tablet Commonly known as:  PRINIVIL,ZESTRIL Take 1 tablet (5 mg total) by mouth daily. Start taking on:  06/24/2017   multivitamin with minerals Tabs tablet Take 1 tablet by mouth daily.   nicotine 14 mg/24hr patch Commonly known as:  NICODERM CQ - dosed in mg/24 hours Place 1 patch (14 mg total) onto the skin daily. Start taking on:  06/24/2017   spironolactone 25 MG tablet Commonly known as:  ALDACTONE Take 1 tablet (25 mg total) by mouth daily. Start taking on:  06/24/2017              Discharge Condition:  Stable, medication assistance provided at the time of discharge  Discharge Instructions Get Medicines reviewed and adjusted: Please take all your medications with you for your next visit with your Primary MD  Please request your Primary MD to go over all hospital tests and procedure/radiological results at the follow up, please ask your Primary MD to get all Hospital records sent to his/her office.  If you experience worsening of your  admission symptoms, develop shortness of breath, life threatening emergency, suicidal or homicidal thoughts you must seek medical attention immediately by calling 911 or calling your MD immediately  if symptoms less severe.  You must read complete instructions/literature along with all the possible adverse reactions/side effects for all the Medicines you take and that have been prescribed to you. Take any new Medicines after you have completely understood and accpet all the possible adverse  reactions/side effects.   Do not drive when taking Pain medications.   Do not take more than prescribed Pain, Sleep and Anxiety Medications  Special Instructions: If you have smoked or chewed Tobacco  in the last 2 yrs please stop smoking, stop any regular Alcohol  and or any Recreational drug use.  Wear Seat belts while driving.  Please note  You were cared for by a hospitalist during your hospital stay. Once you are discharged, your primary care physician will handle any further medical issues. Please note that NO REFILLS for any discharge medications will be authorized once you are discharged, as it is imperative that you return to your primary care physician (or establish a relationship with a primary care physician if you do not have one) for your aftercare needs so that they can reassess your need for medications and monitor your lab values.     Allergies  Allergen Reactions  . Penicillins Other (See Comments)    Pt had slight throat swelling Has patient had a PCN reaction causing immediate rash, facial/tongue/throat swelling, SOB or lightheadedness with hypotension: No Has patient had a PCN reaction causing severe rash involving mucus membranes or skin necrosis: No Has patient had a PCN reaction that required hospitalization No Has patient had a PCN reaction occurring within the last 10 years: No If all of the above answers are "NO", then may proceed with Cephalosporin use.       Disposition: Discharge disposition: 01-Home or Self Care        Consults:  cardiology    Significant Diagnostic Studies:  Dg Chest 2 View  Result Date: 06/20/2017 CLINICAL DATA:  Shortness of breath and chest pain EXAM: CHEST - 2 VIEW COMPARISON:  May 12, 2017 FINDINGS: There is mild interstitial thickening in the mid lower lung zones. There is no appreciable edema or consolidation. Heart is borderline enlarged with pulmonary vascularity within normal limits. No adenopathy. No bone  lesions. No pneumothorax. IMPRESSION: Areas of mild interstitial thickening, likely indicative of chronic inflammatory type change. No edema or consolidation. Heart borderline enlarged with pulmonary vascularity normal. Electronically Signed   By: Lowella Grip III M.D.   On: 06/20/2017 14:22   Ct Angio Chest Pe W And/or Wo Contrast  Result Date: 06/20/2017 CLINICAL DATA:  Shortness of breath. EXAM: CT ANGIOGRAPHY CHEST WITH CONTRAST TECHNIQUE: Multidetector CT imaging of the chest was performed using the standard protocol during bolus administration of intravenous contrast. Multiplanar CT image reconstructions and MIPs were obtained to evaluate the vascular anatomy. CONTRAST:  184m ISOVUE-370 IOPAMIDOL (ISOVUE-370) INJECTION 76% COMPARISON:  Radiographs of same day.  CT scan of March 21, 2008. FINDINGS: Cardiovascular: Satisfactory opacification of the pulmonary arteries to the segmental level. No evidence of pulmonary embolism. Mild cardiomegaly is noted without pericardial effusion. No pericardial effusion. Mediastinum/Nodes: No enlarged mediastinal, hilar, or axillary lymph nodes. Thyroid gland, trachea, and esophagus demonstrate no significant findings. Lungs/Pleura: No pneumothorax or pleural effusion is noted. Bulla formation is noted in both upper lobes. Mild septal thickening is noted throughout both lungs concerning for  pulmonary edema. Upper Abdomen: No acute abnormality. Musculoskeletal: No chest wall abnormality. No acute or significant osseous findings. Review of the MIP images confirms the above findings. IMPRESSION: No definite evidence of pulmonary embolus. Bulla formation is noted in both upper lobes. Mild septal thickening is seen throughout both lungs concerning for pulmonary edema. Electronically Signed   By: Marijo Conception, M.D.   On: 06/20/2017 15:36    echocardiogram   ------------------------------------------------------------------- LV EF: 25% -    30%  ------------------------------------------------------------------- History:   PMH:  Breast Cancer II, Transient Ischemic Attack, Hypertension, Polysubstance Abuse, Congestive Heart Failure, Anemia. Acute Diastolic Heart Failure.  ------------------------------------------------------------------- Study Conclusions  - Left ventricle: The cavity size was normal. Wall thickness was   normal. Systolic function was severely reduced. The estimated   ejection fraction was in the range of 25% to 30%. Abnormal GLS -   reported to be -12.5%. Diffuse hypokinesis. Doppler parameters   are consistent with restrictive left ventricular relaxation   (grade 3 diastolic dysfunction). The E/e&' ratio is >15,   suggesting elevated LV filling pressure. - Aorta: Ascending aortic diameter: 38 mm (S). - Ascending aorta: The ascending aorta was mildly dilated. - Mitral valve: Mildly thickened leaflets . There was mild to   moderate regurgitation. - Left atrium: Severely dilated. - Right ventricle: The cavity size was mildly dilated. Mildly   reduced systolic function. - Right atrium: The atrium was mildly dilated. - Atrial septum: Bows from left to right, suggesting high LA > RA   pressure. - Tricuspid valve: There was mild regurgitation. - Pulmonary arteries: PA peak pressure: 30 mm Hg (S). - Inferior vena cava: The vessel was normal in size. The   respirophasic diameter changes were in the normal range (>= 50%),   consistent with normal central venous pressure.  Impressions:  - LVEF 25-30%, normal wall thickness, severe global hypokinesis,   grade 3 DD with elevated LV filling pressure, dilated ascending   aorta to 3.8 cm, mild to moderate MR, severe LAE, mild RVE with   mildly reduced systolic function, mild LAE, mild TR, RVSP 30   mmHg, normal IVC.  -------------------------------------------------------------------      Filed Weights   06/21/17 0553 06/22/17 0522 06/23/17 0653   Weight: 61.9 kg (136 lb 6.4 oz) 60.8 kg (134 lb 0.6 oz) 60.3 kg (132 lb 15 oz)     Microbiology: Recent Results (from the past 240 hour(s))  Culture, Urine     Status: Abnormal   Collection Time: 06/20/17  6:41 PM  Result Value Ref Range Status   Specimen Description   Final    URINE, RANDOM Performed at [facility] [address] [place], [place] [address]    Special Requests   Final    NONE Performed at [facility], [place] [address], [place], [place] [address]    Culture MULTIPLE SPECIES PRESENT, SUGGEST RECOLLECTION (A)  Final   Report Status 06/22/2017 FINAL  Final       Blood Culture    Component Value Date/Time   SDES  06/20/2017 1841    URINE, RANDOM Performed at [facility], [place] [address] [place], [place] [address]    SPECREQUEST  06/20/2017 1841    NONE Performed at [name], [place] [address], [place], [place] [address]    CULT MULTIPLE SPECIES PRESENT, SUGGEST RECOLLECTION (A) 06/20/2017 1841   REPTSTATUS 06/22/2017 FINAL 06/20/2017 1841      Labs: Results for orders placed  or performed during the hospital encounter of 06/20/17 (from the past 48 hour(s))  Basic metabolic panel     Status: Abnormal   Collection Time: 06/22/17  5:09 AM  Result Value Ref Range   Sodium 142 135 - 145 mmol/L   Potassium 3.5 3.5 - 5.1 mmol/L   Chloride 103 101 - 111 mmol/L   CO2 29 22 - 32 mmol/L   Glucose, Bld 100 (H) 65 - 99 mg/dL   BUN 21 (H) 6 - 20 mg/dL   Creatinine, Ser 1.04 (H) 0.44 - 1.00 mg/dL   Calcium 9.0 8.9 - 10.3 mg/dL   GFR calc non Af Amer >60 >60 mL/min   GFR calc Af Amer >60 >60 mL/min    Comment: (NOTE) The eGFR has been calculated using the CKD EPI equation. This calculation has not been validated in all clinical situations. eGFR's persistently <60 mL/min signify possible Chronic Kidney Disease.    Anion gap 10 5 - 15    Comment: Performed at [facility], [place] [address] [place], [place] [address]  Basic metabolic panel     Status: Abnormal   Collection Time: 06/23/17  5:02 AM  Result Value Ref Range   Sodium 140 135 - 145 mmol/L   Potassium 3.6 3.5 - 5.1 mmol/L   Chloride 105 101 - 111 mmol/L   CO2 27 22 - 32 mmol/L   Glucose, Bld 101 (H) 65 - 99 mg/dL   BUN 18 6 - 20 mg/dL   Creatinine, Ser 0.79 0.44 - 1.00 mg/dL   Calcium 9.1 8.9 - 10.3 mg/dL   GFR calc non Af Amer >60 >60 mL/min   GFR calc Af Amer >60 >60 mL/min    Comment: (NOTE) The eGFR has been calculated using the CKD EPI equation. This calculation has not been validated in all clinical situations. eGFR's persistently <60 mL/min signify possible Chronic Kidney Disease.    Anion gap 8 5 - 15    Comment: Performed at [facility], [place] [address] [place], [place] [address]     Lipid Panel     Component Value Date/Time   CHOL 185 06/21/2017 0519   TRIG 59 06/21/2017 0519   HDL 93 06/21/2017 0519   CHOLHDL 2.0 06/21/2017 0519   VLDL 12 06/21/2017 0519   LDLCALC 80 06/21/2017 0519     Lab Results  Component Value Date   HGBA1C 5.2 11/25/2011     Lab Results  Component Value Date   LDLCALC 80 06/21/2017   CREATININE 0.79 06/23/2017     HPI :  47 y.o. female with a PMH of poorly controlled HTN, invasive ductal carcinoma s/p L mastectomy (2008), chemo, and tamoxifen, COPD, TIA, tobacco abuse, and polysubstance abuse (cocaine, marijuana, and opioids) who is being seen today for the evaluation of acute combined CHF  was in her usual state of health until about 1 month ago when she developed shortness of breath. She was diagnosed with PNA in April and initially felt better after completing her antibiotics course, however began experiencing progressive SOB and DOE. She reports using her inhaler for COPD every 1-2 hours with no relief of symptoms. She reports having a dry non-productive cough. She also noted left sided chest pain worse with  coughing/deep breathing which she attributed to accessory muscle use. Over the past 3 days her symptoms have significantly worsened significantly. She reports PND, 4 pillow orthopnea, weakness, nausea, and diaphoresis. She presented to the ED 06/19/17 with c/o  SOB, however left AMA prior to evaluation after refusing further attempts at IV access. Given persistent symptoms, she re-presented to the ED 06/20/17 and was agreeable to admission.   She denies prior cardiac history. She had a normal echocardiogram in 2013 as part of a work-up for TIA. She has never had an ischemic evaluation with either a stress test or cardiac catheterization. She denies family history of CAD or CHF. She reports smoking 1-2 cigarettes per day. Her Utox this admission was positive for opiates, cocaine, and marijuana. She admits to using cocaine last 1 week ago and generally uses 1x/mo.   At the time of this evaluation, she was feeling significantly better. She reports being able to breathe comfortably while laying on her back. She feels her breathing is back to baseline. She states her chest pain resolved after receiving tramadol in the ED and has not reoccurred. She denies recent fever, LE edema, or abdominal pain  Hospital course: VSS. Labs notable for electrolytes wnl, Cr 0.81>1.04, Hgb 11.5, PLT 297, BNP 1844, Trop 0.05>0.04>0.03, LDL 80, anemia w/u wnl, TSH wnl, Ddimer 2.47. CT Chest with no PE but revealed pulmonary edema. EKG with sinus tachycardia, LVH, biatrial enlargement, and QTc 492; no STE/D. Echo revealed EF 25-30%, diffuse hypokinesis, G3DD, severely dilated LA, mildly dilated RV with mild reduction in systolic function. She was started on IV lasix 28m daily for diuresis, and bidil and lisinopril for acute combined CHF. Cardiology asked to evaluate for acute combined CHF.      HOSPITAL COURSE: *  1. Acute combined CHF: patient presented with progressively worsening SOB/DOE. Also with left sided chest pain  exacerbated by coughing/deep breathing. CT chest revealed pulmonary edema but no PE. BNP >1800. TSH wnl. Echo with EF 25-30%, G3DD, diffuse hypokinesis, severe LAE, mild RVE w mildly reduced systolic function. She was started on IV lasix 488mdaily with net   Weight 138lb>134lbs  . Her  Cr are   0.81> 1.04>0.79 respectively today. Unclear cause at this time but possible this is HTN mediated as BP has been poorly controlled, consistently with SBP >150 and DBP>90 over the last year. Differential also includes ischemic cause, or less likely chemotherapy induced cardiomyopathy although she is 1032yrmoved from doxorubicin/cyclophosphamide administration.   Started on  Bidil and ACEi   added spironolactone and monitor BP  - Avoid Bblocker given recent cocaine abuse. carvedilol is preferred - would wait for evidence of compliance with medications and follow-up in at least 90 days of guideline based heart failure management before we decide whether she should get a defibrillator.    2. Mild troponin elevation: trop trend 0.05>0.04>0.03. EKG without ischemic changes. Elevation not consistent with ACS. More likely demand ischemia in the setting of acute combined CHF.  - Ultimately would benefit from definitive ischemic evaluation   some type of noninvasive evaluation for coronary disease, preferably coronary CT angiogram    3. HTN: BP improved this admission after starting bidil and lisinopril. - Continue current regimen - also started low dose spironolactone  - Monitor BP closely   4. HLD: LDL 80 this admission; goal <70. Started on atorvastatin 34m35mily - Continue statin  5. Polysubstance abuse: daily tobacco use, weekly cocaine, marijuana, and opiate abuse. Counseled on the importance of cessation of tobacco and illicit drugs - Continue to encourage cessation - Avoid Bblockers given cocaine use  6. History of Breast Cancer: follows outpatient with oncology (last seen 11/2016) - Continue  routine monitoring  7.Anemia: Mild. H/o vit  B12 deficiency with level wnl. Iron panel with normal ferritin and iron and TIBC. - Monitor CBC intermittently.   8. Polysubstance and tobacco abuse: UDS +opioid, THC, cocaine.  - Counseled that cessation is a must to improve health. Social work also consulted.   9. History of TIA:  - Continue aspirin. LDL is 80, start statin  10. History of breast cancer - Patientnoted to be clinically and radiographically without evidence of disease or recurrence of breast cancer.      Discharge Exam:   Blood pressure (!) 116/93, pulse 91, temperature 97.8 F (36.6 C), temperature source Oral, resp. rate 16, height '5\' 5"'  (1.651 m), weight 60.3 kg (132 lb 15 oz), last menstrual period 02/18/2015, SpO2 100 %.  Gen: Chronically ill-appearing 47 y.o. female in no distress  Pulm: Non-labored on room air, no crackles or wheezing. CV: Regular rate and rhythm. +S3 vs. widened split S2. No murmur, rub, or gallop. No JVD, no significant pedal edema. GI: Abdomen soft, non-tender, non-distended, with normoactive bowel sounds. No organomegaly or masses felt. Ext: Warm, no deformities Skin: No rashes, lesions or ulcers Neuro: Alert and oriented. No focal neurological deficits. Psych: Judgement and insight appear normal. Mood & affect appropriate.        Follow-up Information    Erlene Quan, PA-C Follow up on 07/04/2017.   Specialties:  Cardiology, Radiology Why:  Please arrive 15 minutes early for your 11:30 am cardiology follow-up appointment Contact information: [address] [place] [address] [place]. Call.   Why:  to make follow-up appointment Contact information: [place] [id number] [phone]          Signed: Reyne Dumas 06/23/2017, 10:51 AM

## 2017-06-23 NOTE — Care Management Note (Signed)
Case Management Note  Patient Details  Name: Sabrina Holland MRN: [id number] Date of Birth: 10/29/1970  Subjective/Objective: Provided patient w/MATCH program-informed of 1 time use/12 calendar months/$3 co pay per each script(she can afford) She has appt @ [facility]-she will go directly there for her meds to get filled-patient voiced understanding. No further CM needs.                   Action/Plan:d/c home.   Expected Discharge Date:  06/23/17               Expected Discharge Plan:  Home/Self Care  In-House Referral:     Discharge planning Services  CM Consult, [facility], [facility]  Post Acute Care Choice:    Choice offered to:     DME Arranged:    DME Agency:     HH Arranged:    HH Agency:     Status of Service:  Completed, signed off  If discussed at [employer], dates discussed:    Additional Comments:  Dessa Phi, RN 06/23/2017, 11:21 AM

## 2017-06-23 NOTE — Clinical Social Work Note (Signed)
Clinical Social Work Assessment  Patient Details  Name: Sabrina Holland MRN: [id number] Date of Birth: [birth year]  Date of referral:  06/23/17               Reason for consult:  Other (Comment Required)(Housing Resources.)                Permission sought to share information with:  Case Manager, Other([email or web address] ) Permission granted to share information::  Yes, Verbal Permission Granted  Name::        Agency::     Relationship::     Contact Information:     Housing/Transportation Living arrangements for the past 2 months:  Apartment Source of Information:  Patient Patient Interpreter Needed:  None Criminal Activity/Legal Involvement Pertinent to Current Situation/Hospitalization:  No - Comment as needed Significant Relationships:  Adult Children, Other Family Members Lives with:  Significant Other Do you feel safe going back to the place where you live?  Yes Need for family participation in patient care:  No (Coment)  Care giving concerns:   Provide resources for Housing. Patient is currently living with Boyfriend "unsure how long she will continue to live there."   Social Worker assessment / plan: CSW met with patient at bedside and explain role. Patient reports she has been waiting to see a Education officer, museum and was very receptive to visit.  Patient reports she currently lives with her boyfriend and will does not know how long she will continue to live there. Patient denies any abuse. She reports she works part-time and does not have enough income to sustain a apartment on her own. She reports she has not currently looked into any assistance programs and requested provide resources. CSW provided list of housing programs and the contact information-[place] authority and Coalition. CSW inquired if CSW could send her information via Sabrina Holland for additional housing services. Patient reports understanding and plans to follow up with information provided.   Employment status:   Systems developer information:  Self Pay (Medicaid Pending) PT Recommendations:  Not assessed at this time Information / Referral to community resources:  Other (Comment Required)(Sabrina Holland )  Patient/Family's Response to care:  Agreeable and Responding well to care. Patient appreciative of CSW visit.   Patient/Family's Understanding of and Emotional Response to Diagnosis, Current Treatment, and Prognosis:  Patient has good understanding of medical history, diagnosis and current treatment.  Emotional Assessment Appearance:  Appears stated age Attitude/Demeanor/Rapport:    Affect (typically observed):  Accepting, Calm Orientation:  Oriented to Self, Oriented to Place, Oriented to Situation, Oriented to  Time Alcohol / Substance use:  Not Applicable Psych involvement (Current and /or in the community):  No (Comment)  Discharge Needs  Concerns to be addressed:  Discharge Planning Concerns Readmission within the last 30 days:  No Current discharge risk:  None Barriers to Discharge:  No Barriers Identified   Sabrina Hopping, LCSW 06/23/2017, 12:52 PM

## 2017-06-29 DIAGNOSIS — J449 Chronic obstructive pulmonary disease, unspecified: Secondary | ICD-10-CM | POA: Insufficient documentation

## 2017-07-04 ENCOUNTER — Ambulatory Visit (INDEPENDENT_AMBULATORY_CARE_PROVIDER_SITE_OTHER): Payer: Self-pay | Admitting: Cardiology

## 2017-07-04 ENCOUNTER — Encounter: Payer: Self-pay | Admitting: Cardiology

## 2017-07-04 DIAGNOSIS — I5021 Acute systolic (congestive) heart failure: Secondary | ICD-10-CM

## 2017-07-04 DIAGNOSIS — Z853 Personal history of malignant neoplasm of breast: Secondary | ICD-10-CM

## 2017-07-04 DIAGNOSIS — F1910 Other psychoactive substance abuse, uncomplicated: Secondary | ICD-10-CM

## 2017-07-04 DIAGNOSIS — I10 Essential (primary) hypertension: Secondary | ICD-10-CM

## 2017-07-04 DIAGNOSIS — I429 Cardiomyopathy, unspecified: Secondary | ICD-10-CM

## 2017-07-04 MED ORDER — LISINOPRIL 10 MG PO TABS: 10.0000 mg | ORAL_TABLET | Freq: Every day | ORAL | 6 refills | Status: DC

## 2017-07-04 NOTE — Assessment & Plan Note (Signed)
HTN with grade 3 DD on echo

## 2017-07-04 NOTE — Assessment & Plan Note (Signed)
Pt admitted with new systolic CHF and new LVD in the setting of cocaine positive UDS

## 2017-07-04 NOTE — Assessment & Plan Note (Signed)
New LVD on admission June 2019- etiology not yet determined

## 2017-07-04 NOTE — Assessment & Plan Note (Signed)
Cocaine, opiate, and marijuana positive on admission June 2019

## 2017-07-04 NOTE — Progress Notes (Signed)
07/04/2017 Sabrina Holland   03/15/1970  [id number]  Primary Physician Patient, No Pcp Per Primary Cardiologist: Dr Sallyanne Kuster  HPI:  Pleasant 47 y.o. AA female with a PMH of poorly controlled HTN, invasive ductal carcinoma s/p L mastectomy (2008), followed by chemo, and tamoxifen, COPD on inhalers, h/o TIA, tobacco abuse-<1/2 ppd, and polysubstance abuse (cocaine, marijuana, and opioids positive June 2019) who was seen in consult 06/22/17 for the evaluation of acute combined CHF. Prior echo in 2013 as part of her TIA work up had shown normal LVF. Echo done 06/21/17 showed her EF to be 25% with grade 3 DD, severe LAE, mild to moderate MR, and AAO dilatation-3.8cm. She was diuresed 8 lbs and her medications were adjusted. Dr Sallyanne Kuster had indicated the plan was to f/u her echo in a few months and if her EF remained depressed, consider ischemic evaluation at that point.   She is in the office today for follow up. She has been doing well, no unusual SOB or edema. She is working on smoking cessation. She does drink a 40 oz beer daily and I suggested this is not helping her heart and she will stop. She was prescribed Isordil but the pharmacy is out of stock. Her renal function was normal and her B/P by me was 100/ 60.    Current Outpatient Medications  Medication Sig Dispense Refill  . amLODipine (NORVASC) 5 MG tablet Take 1 tablet (5 mg total) by mouth daily. 30 tablet 2  . aspirin EC 81 MG EC tablet Take 1 tablet (81 mg total) by mouth daily. 30 tablet 0  . atorvastatin (LIPITOR) 40 MG tablet Take 1 tablet (40 mg total) by mouth daily at 6 PM. 30 tablet 2  . furosemide (LASIX) 20 MG tablet Take 1 tablet (20 mg total) by mouth daily. 30 tablet 1  . hydrALAZINE (APRESOLINE) 25 MG tablet Take 1 tablet (25 mg total) by mouth 3 (three) times daily. 90 tablet 1  . isosorbide dinitrate (ISORDIL) 20 MG tablet Take 1 tablet (20 mg total) by mouth 2 (two) times daily. 60 tablet 2  . lisinopril  (PRINIVIL,ZESTRIL) 5 MG tablet Take 1 tablet (5 mg total) by mouth daily. 30 tablet 2  . Multiple Vitamin (MULTIVITAMIN WITH MINERALS) TABS tablet Take 1 tablet by mouth daily.    . nicotine (NICODERM CQ - DOSED IN MG/24 HOURS) 14 mg/24hr patch Place 1 patch (14 mg total) onto the skin daily. 28 patch 0  . spironolactone (ALDACTONE) 25 MG tablet Take 1 tablet (25 mg total) by mouth daily. 30 tablet 1   No current facility-administered medications for this visit.     Allergies  Allergen Reactions  . Penicillins Other (See Comments)    Pt had slight throat swelling Has patient had a PCN reaction causing immediate rash, facial/tongue/throat swelling, SOB or lightheadedness with hypotension: No Has patient had a PCN reaction causing severe rash involving mucus membranes or skin necrosis: No Has patient had a PCN reaction that required hospitalization No Has patient had a PCN reaction occurring within the last 10 years: No If all of the above answers are "NO", then may proceed with Cephalosporin use.     Past Medical History:  Diagnosis Date  . Breast cancer ([place])   . Breast cancer, stage 2 ([place]) 02/23/2011  . COPD (chronic obstructive pulmonary disease) ([place])   . Hypertension   . TIA (transient ischemic attack) 11/25/2011   "first time" (11/25/2011)    Social History  Socioeconomic History  . Marital status: Single    Spouse name: Not on file  . Number of children: 4  . Years of education: Not on file  . Highest education level: Not on file  Occupational History    Employer: [employer]  Social Needs  . Financial resource strain: Not on file  . Food insecurity:    Worry: Not on file    Inability: Not on file  . Transportation needs:    Medical: Not on file    Non-medical: Not on file  Tobacco Use  . Smoking status: Current Every Day Smoker    Packs/day: 0.00    Years: 20.00    Pack years: 0.00    Types: Cigarettes  . Smokeless tobacco: Never Used  . Tobacco comment: 1  or 2 cigarettes a day- approx 1 pack week  Substance and Sexual Activity  . Alcohol use: Yes    Alcohol/week: 7.2 oz    Types: 12 Cans of beer per week    Comment: occasionally  . Drug use: Yes    Types: Marijuana, Cocaine    Comment: marijuana use 2 blunts a week- less than an oz /week  . Sexual activity: Yes    Birth control/protection: Surgical  Lifestyle  . Physical activity:    Days per week: Not on file    Minutes per session: Not on file  . Stress: Not on file  Relationships  . Social connections:    Talks on phone: Not on file    Gets together: Not on file    Attends religious service: Not on file    Active member of club or organization: Not on file    Attends meetings of clubs or organizations: Not on file    Relationship status: Not on file  . Intimate partner violence:    Fear of current or ex partner: Not on file    Emotionally abused: Not on file    Physically abused: Not on file    Forced sexual activity: Not on file  Other Topics Concern  . Not on file  Social History Narrative   ** Merged History Encounter **         Family History  Problem Relation Age of Onset  . Heart disease Mother   . Sickle cell anemia Cousin        maternal cousin     Review of Systems: General: negative for chills, fever, night sweats or weight changes.  Cardiovascular: negative for chest pain, dyspnea on exertion, edema, orthopnea, palpitations, paroxysmal nocturnal dyspnea or shortness of breath Dermatological: negative for rash Respiratory: negative for cough or wheezing Urologic: negative for hematuria Abdominal: negative for nausea, vomiting, diarrhea, bright red blood per rectum, melena, or hematemesis Neurologic: negative for visual changes, syncope, or dizziness All other systems reviewed and are otherwise negative except as noted above.    Blood pressure 130/70, pulse 96, height 5\' 5"  (1.651 m), weight 133 lb (60.3 kg), last menstrual period 02/18/2015.    General appearance: alert, cooperative, no distress and poor dentition Neck: no carotid bruit and no JVD Lungs: clear to auscultation bilaterally Heart: regular rate and rhythm and S4 present Extremities: extremities normal, atraumatic, no cyanosis or edema Skin: Skin color, texture, turgor normal. No rashes or lesions Neurologic: Grossly normal  ASSESSMENT AND PLAN:   Acute systolic heart failure (HCC) Pt admitted with new systolic CHF and new LVD in the setting of cocaine positive UDS  History of breast cancer S/P mastectomy followed  by chemo 2008  Cardiomyopathy ([place]) New LVD on admission June 2019- etiology not yet determined  HTN (hypertension) HTN with grade 3 DD on echo  Polysubstance abuse ([place]) Cocaine, opiate, and marijuana positive on admission June 2019    PLAN  We discussed smoking, avoidance of alcohol and cocaine. I stopped her hydralazine and told her she did not need to fill her Rx for Isordil. I increased her Lisinopril to 10 mg daily. I would like to get a BMP next week and then see her back in follow up to consider Entresto. 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 She'll need a f/u echo in 3 months, then f/u with dr Sallyanne Kuster.   Kerin Ransom PA-C 07/04/2017 11:55 AM

## 2017-07-04 NOTE — Assessment & Plan Note (Signed)
S/P mastectomy followed by chemo 2008

## 2017-07-04 NOTE — Patient Instructions (Addendum)
Medication Instructions:  INCREASE Lisinopril to 10mg  Take 1 tablet once a day DISCONTINUE Hydralazine DISCONTINUE Isosorbide  Labwork: Your physician recommends that you return for lab work in: 1 week BMP  Testing/Procedures: None   Follow-Up: Your physician recommends that you schedule a follow-up appointment in: 7-10 days with Lurena Joiner  Any Other Special Instructions Will Be Listed Below (If Applicable).  If you need a refill on your cardiac medications before your next appointment, please call your pharmacy.

## 2017-07-05 ENCOUNTER — Encounter: Payer: Self-pay | Admitting: Internal Medicine

## 2017-07-05 ENCOUNTER — Ambulatory Visit: Payer: Self-pay | Attending: Internal Medicine | Admitting: Internal Medicine

## 2017-07-05 ENCOUNTER — Ambulatory Visit: Payer: Self-pay | Admitting: Licensed Clinical Social Worker

## 2017-07-05 VITALS — BP 127/87 | HR 98 | Temp 98.2°F | Resp 16 | Wt 136.8 lb

## 2017-07-05 DIAGNOSIS — Z853 Personal history of malignant neoplasm of breast: Secondary | ICD-10-CM | POA: Insufficient documentation

## 2017-07-05 DIAGNOSIS — F419 Anxiety disorder, unspecified: Secondary | ICD-10-CM

## 2017-07-05 DIAGNOSIS — Z8673 Personal history of transient ischemic attack (TIA), and cerebral infarction without residual deficits: Secondary | ICD-10-CM | POA: Insufficient documentation

## 2017-07-05 DIAGNOSIS — I110 Hypertensive heart disease with heart failure: Secondary | ICD-10-CM | POA: Insufficient documentation

## 2017-07-05 DIAGNOSIS — I5021 Acute systolic (congestive) heart failure: Secondary | ICD-10-CM | POA: Insufficient documentation

## 2017-07-05 DIAGNOSIS — I10 Essential (primary) hypertension: Secondary | ICD-10-CM

## 2017-07-05 DIAGNOSIS — J449 Chronic obstructive pulmonary disease, unspecified: Secondary | ICD-10-CM | POA: Insufficient documentation

## 2017-07-05 DIAGNOSIS — Z720 Tobacco use: Secondary | ICD-10-CM | POA: Insufficient documentation

## 2017-07-05 NOTE — Patient Instructions (Addendum)
It was good to see you today.  We have reviewed your prior records including labs and tests today  Medications reviewed and updated, no changes recommended at this time.  Continue working with cardiology as scheduled  Stop smoking and other drug use as discussed!  Social work discussions as begun today  Please schedule followup here in 4-6 months, call sooner if problems.   Smoking Tobacco Information Smoking tobacco will very likely harm your health. Tobacco contains a poisonous (toxic), colorless chemical called nicotine. Nicotine affects the brain and makes tobacco addictive. This change in your brain can make it hard to stop smoking. Tobacco also has other toxic chemicals that can hurt your body and raise your risk of many cancers. How can smoking tobacco affect me? Smoking tobacco can increase your chances of having serious health conditions, such as:  Cancer. Smoking is most commonly associated with lung cancer, but can lead to cancer in other parts of the body.  Chronic obstructive pulmonary disease (COPD). This is a long-term lung condition that makes it hard to breathe. It also gets worse over time.  High blood pressure (hypertension), heart disease, stroke, or heart attack.  Lung infections, such as pneumonia.  Cataracts. This is when the lenses in the eyes become clouded.  Digestive problems. This may include peptic ulcers, heartburn, and gastroesophageal reflux disease (GERD).  Oral health problems, such as gum disease and tooth loss.  Loss of taste and smell.  Smoking can affect your appearance by causing:  Wrinkles.  Yellow or stained teeth, fingers, and fingernails.  Smoking tobacco can also affect your social life.  Many workplaces, [employer], hotels, and public places are tobacco-free. This means that you may experience challenges in finding places to smoke when away from home.  The cost of a smoking habit can be expensive. Expenses for someone who  smokes come in two ways: ? You spend money on a regular basis to buy tobacco. ? Your health care costs in the long-term are higher if you smoke.  Tobacco smoke can also affect the health of those around you. Children of smokers have greater chances of: ? Sudden infant death syndrome (SIDS). ? Ear infections. ? Lung infections.  What lifestyle changes can be made?  Do not start smoking. Quit if you already do.  To quit smoking: ? Make a plan to quit smoking and commit yourself to it. Look for programs to help you and ask your health care provider for recommendations and ideas. ? Talk with your health care provider about using nicotine replacement medicines to help you quit. Medicine replacement medicines include gum, lozenges, patches, sprays, or pills. ? Do not replace cigarette smoking with electronic cigarettes, which are commonly called e-cigarettes. The safety of e-cigarettes is not known, and some may contain harmful chemicals. ? Avoid places, people, or situations that tempt you to smoke. ? If you try to quit but return to smoking, don't give up hope. It is very common for people to try a number of times before they fully succeed. When you feel ready again, give it another try.  Quitting smoking might affect the way you eat as well as your weight. Be prepared to monitor your eating habits. Get support in planning and following a healthy diet.  Ask your health care provider about having regular tests (screenings) to check for cancer. This may include blood tests, imaging tests, and other tests.  Exercise regularly. Consider taking walks, joining a gym, or doing yoga or exercise classes.  Develop skills to manage your stress. These skills include meditation. What are the benefits of quitting smoking? By quitting smoking, you may:  Lower your risk of getting cancer and other diseases caused by smoking.  Live longer.  Breathe better.  Lower your blood pressure and heart  rate.  Stop your addiction to tobacco.  Stop creating secondhand smoke that hurts other people.  Improve your sense of taste and smell.  Look better over time, due to having fewer wrinkles and less staining.  What can happen if changes are not made? If you do not stop smoking, you may:  Get cancer and other diseases.  Develop COPD or other long-term (chronic) lung conditions.  Develop serious problems with your heart and blood vessels (cardiovascular system).  Need more tests to screen for problems caused by smoking.  Have higher, long-term healthcare costs from medicines or treatments related to smoking.  Continue to have worsening changes in your lungs, mouth, and nose.  Where to find support: To get support to quit smoking, consider:  Asking your health care provider for more information and resources.  Taking classes to learn more about quitting smoking.  Looking for local organizations that offer resources about quitting smoking.  Joining a support group for people who want to quit smoking in your local community.  Where to find more information: You may find more information about quitting smoking from:  HelpGuide.org: www.helpguide.org/articles/addictions/how-to-quit-smoking.htm  https://hall.com/: smokefree.gov  American Lung Association: www.lung.org  Contact a health care provider if:  You have problems breathing.  Your lips, nose, or fingers turn blue.  You have chest pain.  You are coughing up blood.  You feel faint or you pass out.  You have other noticeable changes that cause you to worry. Summary  Smoking tobacco can negatively affect your health, the health of those around you, your finances, and your social life.  Do not start smoking. Quit if you already do. If you need help quitting, ask your health care provider.  Think about joining a support group for people who want to quit smoking in your local community. There are many effective  programs that will help you to quit this behavior. This information is not intended to replace advice given to you by your health care provider. Make sure you discuss any questions you have with your health care provider. Document Released: 01/19/2016 Document Revised: 01/19/2016 Document Reviewed: 01/19/2016 Elsevier Interactive Patient Education  [employer].
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
## 2017-07-05 NOTE — Assessment & Plan Note (Signed)
BP Readings from Last 3 Encounters:  07/05/17 127/87  07/04/17 130/70  06/23/17 (!) 116/93   Well controlled - continue same

## 2017-07-05 NOTE — Assessment & Plan Note (Signed)
Acute systolic CHF hosp 06/1681 in setting of coc pos UDS. Followed by cards with med adjustments as noted. Avoid beta-blocker due to coc hx until assured of abstinence. Vitals stable and asymptomatic - follow up echo per cards this fall

## 2017-07-05 NOTE — Assessment & Plan Note (Signed)
Continued tobacco abuse today 5 minutes today spent counseling patient on unhealthy effects of continued tobacco abuse and encouragement of cessation including medical options available to help the patient quit smoking.

## 2017-07-05 NOTE — Progress Notes (Signed)
Subjective:    Patient ID: Elby Showers, female    DOB: [birth year], 47 y.o.   MRN: [id number]  HPI  Patient here for HFU: seen 6/4-6/7 at [facility] for acute syst CHF exac. Yesterday had follow up with heart care and meds adjusted - She is feeling well: no shortness of breath, edema or chest pain - Taking meds as rx'd. Primary concern today is talking with SW on housing and other social needs  Past Medical History:  Diagnosis Date  . Breast cancer ([place])   . Breast cancer, stage 2 ([place]) 02/23/2011  . COPD (chronic obstructive pulmonary disease) ([place])   . Hypertension   . TIA (transient ischemic attack) 11/25/2011   "first time" (11/25/2011)    Review of Systems  Constitutional: Negative for activity change and fatigue.  Respiratory: Negative for chest tightness, shortness of breath and wheezing.   Cardiovascular: Negative for chest pain, palpitations and leg swelling.       Objective:    Physical Exam  Constitutional: She appears well-developed and well-nourished. No distress.  Cardiovascular: Normal rate, regular rhythm and normal heart sounds.  No murmur heard. Pulmonary/Chest: Effort normal and breath sounds normal. No respiratory distress.  Musculoskeletal: She exhibits no edema.    BP 127/87 (BP Location: Left Arm, Patient Position: Sitting, Cuff Size: Normal)   Pulse 98   Temp 98.2 F (36.8 C) (Oral)   Resp 16   Wt 136 lb 12.8 oz (62.1 kg)   LMP 02/18/2015 (Exact Date) Comment: MAYBE ONCE A YEAR  SpO2 98%   BMI 22.76 kg/m  Wt Readings from Last 3 Encounters:  07/05/17 136 lb 12.8 oz (62.1 kg)  07/04/17 133 lb (60.3 kg)  06/23/17 132 lb 15 oz (60.3 kg)     Lab Results  Component Value Date   WBC 5.9 06/21/2017   HGB 11.5 (L) 06/21/2017   HCT 34.9 (L) 06/21/2017   PLT 297 06/21/2017   GLUCOSE 101 (H) 06/23/2017   CHOL 185 06/21/2017   TRIG 59 06/21/2017   HDL 93 06/21/2017   LDLCALC 80 06/21/2017   ALT 21 06/20/2017   AST 29 06/20/2017   NA 140  06/23/2017   K 3.6 06/23/2017   CL 105 06/23/2017   CREATININE 0.79 06/23/2017   BUN 18 06/23/2017   CO2 27 06/23/2017   TSH 0.410 06/20/2017   INR 1.06 11/25/2011   HGBA1C 5.2 11/25/2011    Dg Chest 2 View  Result Date: 06/20/2017 CLINICAL DATA:  Shortness of breath and chest pain EXAM: CHEST - 2 VIEW COMPARISON:  May 12, 2017 FINDINGS: There is mild interstitial thickening in the mid lower lung zones. There is no appreciable edema or consolidation. Heart is borderline enlarged with pulmonary vascularity within normal limits. No adenopathy. No bone lesions. No pneumothorax. IMPRESSION: Areas of mild interstitial thickening, likely indicative of chronic inflammatory type change. No edema or consolidation. Heart borderline enlarged with pulmonary vascularity normal. Electronically Signed   By: Lowella Grip III M.D.   On: 06/20/2017 14:22   Ct Angio Chest Pe W And/or Wo Contrast  Result Date: 06/20/2017 CLINICAL DATA:  Shortness of breath. EXAM: CT ANGIOGRAPHY CHEST WITH CONTRAST TECHNIQUE: Multidetector CT imaging of the chest was performed using the standard protocol during bolus administration of intravenous contrast. Multiplanar CT image reconstructions and MIPs were obtained to evaluate the vascular anatomy. CONTRAST:  153mL ISOVUE-370 IOPAMIDOL (ISOVUE-370) INJECTION 76% COMPARISON:  Radiographs of same day.  CT scan of March 21, 2008. FINDINGS: Cardiovascular:  Satisfactory opacification of the pulmonary arteries to the segmental level. No evidence of pulmonary embolism. Mild cardiomegaly is noted without pericardial effusion. No pericardial effusion. Mediastinum/Nodes: No enlarged mediastinal, hilar, or axillary lymph nodes. Thyroid gland, trachea, and esophagus demonstrate no significant findings. Lungs/Pleura: No pneumothorax or pleural effusion is noted. Bulla formation is noted in both upper lobes. Mild septal thickening is noted throughout both lungs concerning for pulmonary edema.  Upper Abdomen: No acute abnormality. Musculoskeletal: No chest wall abnormality. No acute or significant osseous findings. Review of the MIP images confirms the above findings. IMPRESSION: No definite evidence of pulmonary embolus. Bulla formation is noted in both upper lobes. Mild septal thickening is seen throughout both lungs concerning for pulmonary edema. Electronically Signed   By: Marijo Conception, M.D.   On: 06/20/2017 15:36       Assessment & Plan:   Problem List Items Addressed This Visit    Acute systolic heart failure ([place]) - Primary    Acute systolic CHF hosp 08/1592 in setting of coc pos UDS. Followed by cards with med adjustments as noted. Avoid beta-blocker due to coc hx until assured of abstinence. Vitals stable and asymptomatic - follow up echo per cards this fall      COPD (chronic obstructive pulmonary disease) ([place])    Continued tobacco abuse today 5 minutes today spent counseling patient on unhealthy effects of continued tobacco abuse and encouragement of cessation including medical options available to help the patient quit smoking.       HTN (hypertension)    BP Readings from Last 3 Encounters:  07/05/17 127/87  07/04/17 130/70  06/23/17 (!) 116/93   Well controlled - continue same       Other Visit Diagnoses    Tobacco abuse           Gwendolyn Grant, MD

## 2017-07-05 NOTE — Progress Notes (Signed)
Hospital f/u- CHF exacerbation

## 2017-07-13 ENCOUNTER — Ambulatory Visit: Payer: Self-pay | Admitting: Cardiology

## 2017-07-25 ENCOUNTER — Ambulatory Visit: Payer: Self-pay | Admitting: Cardiology

## 2017-07-26 ENCOUNTER — Encounter: Payer: Self-pay | Admitting: *Deleted

## 2017-07-31 ENCOUNTER — Ambulatory Visit: Payer: Self-pay | Admitting: Nurse Practitioner

## 2017-08-16 ENCOUNTER — Ambulatory Visit (INDEPENDENT_AMBULATORY_CARE_PROVIDER_SITE_OTHER): Payer: Self-pay | Admitting: Cardiology

## 2017-08-16 ENCOUNTER — Encounter: Payer: Self-pay | Admitting: Cardiology

## 2017-08-16 VITALS — BP 124/80 | HR 90 | Ht 65.0 in | Wt 135.0 lb

## 2017-08-16 DIAGNOSIS — I429 Cardiomyopathy, unspecified: Secondary | ICD-10-CM

## 2017-08-16 DIAGNOSIS — Z853 Personal history of malignant neoplasm of breast: Secondary | ICD-10-CM

## 2017-08-16 DIAGNOSIS — F1910 Other psychoactive substance abuse, uncomplicated: Secondary | ICD-10-CM

## 2017-08-16 DIAGNOSIS — I10 Essential (primary) hypertension: Secondary | ICD-10-CM

## 2017-08-16 LAB — BASIC METABOLIC PANEL
BUN/Creatinine Ratio: 17 (ref 9–23)
BUN: 17 mg/dL (ref 6–24)
CO2: 24 mmol/L (ref 20–29)
Calcium: 9.4 mg/dL (ref 8.7–10.2)
Chloride: 103 mmol/L (ref 96–106)
Creatinine, Ser: 1.01 mg/dL — ABNORMAL HIGH (ref 0.57–1.00)
GFR calc Af Amer: 76 mL/min/{1.73_m2} (ref >59)
GFR calc non Af Amer: 66 mL/min/{1.73_m2} (ref >59)
Glucose: 71 mg/dL (ref 65–99)
Potassium: 4.4 mmol/L (ref 3.5–5.2)
Sodium: 142 mmol/L (ref 134–144)

## 2017-08-16 NOTE — Patient Instructions (Addendum)
Medication Instructions:  Your physician recommends that you continue on your current medications as directed. Please refer to the Current Medication list given to you today.  Labwork: Your physician recommends that you return for lab work in: TODAY-BMET  Testing/Procedures: Your physician has requested that you have an echocardiogram. Echocardiography is a painless test that uses sound waves to create images of your heart. It provides your doctor with information about the size and shape of your heart and how well your heart's chambers and valves are working. This procedure takes approximately one hour. There are no restrictions for this procedure. [place] ECHO NEEDED IN SEPTEMBER  Follow-Up: Your physician recommends that you schedule a follow-up appointment in: AFTER ECHO WITH DR CROITORU  Any Other Special Instructions Will Be Listed Below (If Applicable). If you need a refill on your cardiac medications before your next appointment, please call your pharmacy.

## 2017-08-16 NOTE — Progress Notes (Signed)
08/16/2017 Sabrina Holland   [birth year]  [id number]  Primary Physician Patient, No Pcp Per Primary Cardiologist: Dr Sallyanne Kuster  HPI:  47 y.o.AA femalewith a PMH of poorly controlled HTN, invasive ductal carcinoma s/p L mastectomy (2008), followed by chemo, and tamoxifen, COPD on inhalers, h/o TIA, tobacco abuse-<1/2 ppd, and polysubstance abuse (cocaine, marijuana, and opioids positive June 2019)who was seen in consult 06/22/17 for the evaluation ofacute CHF. Prior echo in 2013 as part of her TIA work up had shown normal LVF. Echo done 06/21/17 showed her EF to be 25% with grade 3 DD, severe LAE, mild to moderate MR, and AAO dilatation-3.8cm. She was diuresed 8 lbs and her medications were adjusted. Dr Sallyanne Kuster had indicated the plan was to f/u her echo in a few months and if her EF remained depressed, consider ischemic evaluation at that point.  I saw her in f/u 07/04/17. She was stable from a CHF standpoint. Her medications were adjusted and I'm seeing her today with the plan of possibly starting her on Entresto. Unfortunately she admitted to me she is still using cocaine- "not 7 days a week, only 3 days a week".  She did mention some DOE noted today on her way to the office. She does weigh herself at home and her weight is up 4 lbs. She has not been following a low sodium diet. She denies orthopnea.   Current Outpatient Medications  Medication Sig Dispense Refill  . amLODipine (NORVASC) 5 MG tablet Take 1 tablet (5 mg total) by mouth daily. 30 tablet 2  . aspirin EC 81 MG EC tablet Take 1 tablet (81 mg total) by mouth daily. 30 tablet 0  . atorvastatin (LIPITOR) 40 MG tablet Take 1 tablet (40 mg total) by mouth daily at 6 PM. 30 tablet 2  . furosemide (LASIX) 20 MG tablet Take 1 tablet (20 mg total) by mouth daily. 30 tablet 1  . lisinopril (PRINIVIL,ZESTRIL) 10 MG tablet Take 1 tablet (10 mg total) by mouth daily. 30 tablet 6  . Multiple Vitamin (MULTIVITAMIN WITH MINERALS) TABS  tablet Take 1 tablet by mouth daily.    . nicotine (NICODERM CQ - DOSED IN MG/24 HOURS) 14 mg/24hr patch Place 1 patch (14 mg total) onto the skin daily. 28 patch 0  . spironolactone (ALDACTONE) 25 MG tablet Take 1 tablet (25 mg total) by mouth daily. 30 tablet 1   No current facility-administered medications for this visit.     Allergies  Allergen Reactions  . Penicillins Other (See Comments)    Pt had slight throat swelling Has patient had a PCN reaction causing immediate rash, facial/tongue/throat swelling, SOB or lightheadedness with hypotension: No Has patient had a PCN reaction causing severe rash involving mucus membranes or skin necrosis: No Has patient had a PCN reaction that required hospitalization No Has patient had a PCN reaction occurring within the last 10 years: No If all of the above answers are "NO", then may proceed with Cephalosporin use.     Past Medical History:  Diagnosis Date  . Breast cancer ([place])   . Breast cancer, stage 2 ([place]) 02/23/2011  . COPD (chronic obstructive pulmonary disease) ([place])   . Hypertension   . TIA (transient ischemic attack) 11/25/2011   "first time" (11/25/2011)    Social History   Socioeconomic History  . Marital status: Single    Spouse name: Not on file  . Number of children: 4  . Years of education: Not on file  . Highest  education level: Not on file  Occupational History    Employer: [employer]  Social Needs  . Financial resource strain: Not on file  . Food insecurity:    Worry: Not on file    Inability: Not on file  . Transportation needs:    Medical: Not on file    Non-medical: Not on file  Tobacco Use  . Smoking status: Current Some Day Smoker    Packs/day: 0.00    Years: 20.00    Pack years: 0.00    Types: Cigarettes  . Smokeless tobacco: Never Used  . Tobacco comment: 1 or 2 cigarettes a day- approx 1 pack week  Substance and Sexual Activity  . Alcohol use: Yes    Alcohol/week: 7.2 oz    Types: 12 Cans of  beer per week    Comment: occasionally  . Drug use: Yes    Types: Marijuana, Cocaine    Comment: marijuana use 2 blunts a week- less than an oz /week  . Sexual activity: Yes    Birth control/protection: Surgical  Lifestyle  . Physical activity:    Days per week: Not on file    Minutes per session: Not on file  . Stress: Not on file  Relationships  . Social connections:    Talks on phone: Not on file    Gets together: Not on file    Attends religious service: Not on file    Active member of club or organization: Not on file    Attends meetings of clubs or organizations: Not on file    Relationship status: Not on file  . Intimate partner violence:    Fear of current or ex partner: Not on file    Emotionally abused: Not on file    Physically abused: Not on file    Forced sexual activity: Not on file  Other Topics Concern  . Not on file  Social History Narrative   ** Merged History Encounter **         Family History  Problem Relation Age of Onset  . Heart disease Mother   . Sickle cell anemia Cousin        maternal cousin     Review of Systems: General: negative for chills, fever, night sweats or weight changes.  Cardiovascular: negative for chest pain, dyspnea on exertion, edema, orthopnea, palpitations, paroxysmal nocturnal dyspnea or shortness of breath Dermatological: negative for rash Respiratory: negative for cough or wheezing Urologic: negative for hematuria Abdominal: negative for nausea, vomiting, diarrhea, bright red blood per rectum, melena, or hematemesis Neurologic: negative for visual changes, syncope, or dizziness All other systems reviewed and are otherwise negative except as noted above.    Blood pressure 124/80, pulse 90, height 5\' 5"  (1.651 m), weight 135 lb (61.2 kg), last menstrual period 02/18/2015.  General appearance: alert, cooperative, no distress and poor dentition Neck: no JVD Lungs: clear to auscultation bilaterally Heart: regular rate  and rhythm Extremities: extremities normal, atraumatic, no cyanosis or edema Skin: Skin color, texture, turgor normal. 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 Recently she has noted an increase of 4 lbs and DOE  History of breast cancer S/P mastectomy followed by chemo 2008  Cardiomyopathy ([place]) New LVD on admission June 2019- etiology not yet determined  HTN (hypertension) HTN with grade 3 DD on  echo  Polysubstance abuse ([place]) Unfortunately she continues to use cocaine on a regular basis   PLAN  I told her she has a good chance of sudden death using cocaine. She indicates she understands and is working on [employer]. She is involved with [facility] and considering counseling.  She is not a candidate for a beta blocker or Entresto at this point, she may not be a candidate for an ICD either.  I ordered a BMP today and will increase her Lisinopril to 20 mg daily if that is stable. Check echo in Sept and f/u with Dr Sallyanne Kuster after that. She'll take an extra Lasix 20 mg until her weight comes back down to 130 lbs. We discussed low sodium diet.   Kerin Ransom PA-C 08/16/2017 10:17 AM

## 2017-09-04 ENCOUNTER — Emergency Department (HOSPITAL_COMMUNITY)
Admission: EM | Admit: 2017-09-04 | Discharge: 2017-09-04 | Disposition: A | Discharge status: Home / Self Care | Payer: Self-pay | Attending: Emergency Medicine | Admitting: Emergency Medicine

## 2017-09-04 ENCOUNTER — Encounter: Payer: Self-pay | Admitting: Nurse Practitioner

## 2017-09-04 ENCOUNTER — Encounter (HOSPITAL_COMMUNITY): Payer: Self-pay

## 2017-09-04 ENCOUNTER — Ambulatory Visit: Payer: Self-pay | Attending: Nurse Practitioner | Admitting: Nurse Practitioner

## 2017-09-04 ENCOUNTER — Other Ambulatory Visit: Payer: Self-pay

## 2017-09-04 ENCOUNTER — Telehealth: Payer: Self-pay

## 2017-09-04 VITALS — BP 156/93 | HR 84 | Temp 98.2°F | Ht 65.0 in | Wt 131.4 lb

## 2017-09-04 DIAGNOSIS — M25561 Pain in right knee: Secondary | ICD-10-CM | POA: Insufficient documentation

## 2017-09-04 DIAGNOSIS — I10 Essential (primary) hypertension: Secondary | ICD-10-CM

## 2017-09-04 DIAGNOSIS — M25571 Pain in right ankle and joints of right foot: Secondary | ICD-10-CM | POA: Insufficient documentation

## 2017-09-04 DIAGNOSIS — I110 Hypertensive heart disease with heart failure: Secondary | ICD-10-CM | POA: Insufficient documentation

## 2017-09-04 DIAGNOSIS — N898 Other specified noninflammatory disorders of vagina: Secondary | ICD-10-CM

## 2017-09-04 DIAGNOSIS — M545 Low back pain, unspecified: Secondary | ICD-10-CM

## 2017-09-04 DIAGNOSIS — W19XXXA Unspecified fall, initial encounter: Secondary | ICD-10-CM

## 2017-09-04 DIAGNOSIS — Z7982 Long term (current) use of aspirin: Secondary | ICD-10-CM | POA: Insufficient documentation

## 2017-09-04 DIAGNOSIS — Z79899 Other long term (current) drug therapy: Secondary | ICD-10-CM | POA: Insufficient documentation

## 2017-09-04 DIAGNOSIS — J449 Chronic obstructive pulmonary disease, unspecified: Secondary | ICD-10-CM

## 2017-09-04 DIAGNOSIS — F419 Anxiety disorder, unspecified: Secondary | ICD-10-CM | POA: Insufficient documentation

## 2017-09-04 DIAGNOSIS — Z8673 Personal history of transient ischemic attack (TIA), and cerebral infarction without residual deficits: Secondary | ICD-10-CM | POA: Insufficient documentation

## 2017-09-04 DIAGNOSIS — I5043 Acute on chronic combined systolic (congestive) and diastolic (congestive) heart failure: Secondary | ICD-10-CM

## 2017-09-04 DIAGNOSIS — F17210 Nicotine dependence, cigarettes, uncomplicated: Secondary | ICD-10-CM | POA: Insufficient documentation

## 2017-09-04 DIAGNOSIS — E782 Mixed hyperlipidemia: Secondary | ICD-10-CM

## 2017-09-04 DIAGNOSIS — Z853 Personal history of malignant neoplasm of breast: Secondary | ICD-10-CM | POA: Insufficient documentation

## 2017-09-04 LAB — POCT URINALYSIS DIP (CLINITEK)
Bilirubin, UA: NEGATIVE
Glucose, UA: NEGATIVE mg/dL
Ketones, POC UA: NEGATIVE mg/dL
NITRITE UA: NEGATIVE
PH UA: 5 (ref 5.0–8.0)
PROTEIN: NEGATIVE
RBC UA: NEGATIVE
Spec Grav, UA: 1.02 (ref 1.010–1.025)
UROBILINOGEN UA: 0.2 U/dL

## 2017-09-04 MED ORDER — ATORVASTATIN CALCIUM 40 MG PO TABS: 40.0000 mg | ORAL_TABLET | Freq: Every day | ORAL | 2 refills | Status: DC

## 2017-09-04 MED ORDER — SPIRONOLACTONE 25 MG PO TABS: 25.0000 mg | ORAL_TABLET | Freq: Every day | ORAL | 1 refills | Status: DC

## 2017-09-04 MED ORDER — ALBUTEROL SULFATE HFA 108 (90 BASE) MCG/ACT IN AERS: 1.0000 | INHALATION_SPRAY | Freq: Four times a day (QID) | RESPIRATORY_TRACT | 1 refills | Status: DC | PRN

## 2017-09-04 MED ORDER — FUROSEMIDE 20 MG PO TABS: 20.0000 mg | ORAL_TABLET | Freq: Every day | ORAL | 1 refills | Status: DC

## 2017-09-04 MED ORDER — AMLODIPINE BESYLATE 5 MG PO TABS: 5.0000 mg | ORAL_TABLET | Freq: Every day | ORAL | 2 refills | Status: DC

## 2017-09-04 NOTE — Progress Notes (Signed)
Assessment & Plan:  Sabrina Holland was seen today for establish care.  Diagnoses and all orders for this visit:  Essential hypertension -     amLODipine (NORVASC) 5 MG tablet; Take 1 tablet (5 mg total) by mouth daily. -     spironolactone (ALDACTONE) 25 MG tablet; Take 1 tablet (25 mg total) by mouth daily. Continue all antihypertensives as prescribed.  Remember to bring in your blood pressure log with you for your follow up appointment.  DASH/Mediterranean Diets are healthier choices for HTN.    Acute on chronic combined systolic and diastolic CHF (congestive heart failure) (HCC) Stable. Denies dyspnea, BLE edema, chest pain -     furosemide (LASIX) 20 MG tablet; Take 1 tablet (20 mg total) by mouth daily.  Vaginal discharge -     POCT URINALYSIS DIP (CLINITEK) -     Urine cytology ancillary only  Mixed hyperlipidemia -     atorvastatin (LIPITOR) 40 MG tablet; Take 1 tablet (40 mg total) by mouth daily at 6 PM. INSTRUCTIONS: Work on a low fat, heart healthy diet and participate in regular aerobic exercise program by working out at least 150 minutes per week; 5 days a week-30 minutes per day. Avoid red meat, fried foods. junk foods, sodas, sugary drinks, unhealthy snacking, alcohol and smoking.  Drink at least 48oz of water per day and monitor your carbohydrate intake daily.    Chronic obstructive pulmonary disease, unspecified COPD type (HCC) -     albuterol (PROVENTIL HFA) 108 (90 Base) MCG/ACT inhaler; Inhale 1-2 puffs into the lungs every 6 (six) hours as needed for wheezing or shortness of breath.    Patient has been counseled on age-appropriate routine health concerns for screening and prevention. These are reviewed and up-to-date. Referrals have been placed accordingly. Immunizations are up-to-date or declined.    Subjective:   Chief Complaint  Patient presents with  . Establish Care    Pt. is here to establish care for hypertension. Pt. stated she gets shortness of breath  sometimes.    HPI Sabrina Holland 47 y.o. female presents to office today to establish care. She has a history of anxiety and acute systolic heart failure and LVD 2/2 cocaine use. UDS was positive for cocaine, opiates and marijuana. SHE IS NOT TO TAKE ANY BB until abstinent from cocaine. She is to have a follow up ECHO in September and is currently seeing Cardiology.  EF 25%. She also has HTN. Currently taking lisinopril 10 mg daily, norvasc 5mg , lasix 20 mg daily and spironolactone for CHF.  CHRONIC HYPERTENSION Disease Monitoring  Blood pressure range BP Readings from Last 3 Encounters:  09/04/17 132/81  09/04/17 (!) 156/93  08/16/17 124/80   Chest pain: no   Dyspnea: no   Claudication: no  Medication compliance: yes  Medication Side Effects  Lightheadedness: no   Urinary frequency: no   Edema: no   Impotence: no  Preventitive Healthcare:  Exercise: no   Diet Pattern: diet: general  Salt Restriction:  no     COPD Proventil. Using 2-3 times per week. Symptoms well controlled with minimal use of SABA.   Vaginitis Patient complains of an abnormal vaginal discharge for several daysVaginal symptoms include none.Vulvar symptoms include none.STI Risk: Possible STD exposureDischarge described as: copious and white.Other associated symptoms: none.Menstrual pattern: She had been bleeding postemenopausal.     Review of Systems  Constitutional: Negative for fever, malaise/fatigue and weight loss.  HENT: Negative.  Negative for nosebleeds.   Eyes: Negative.  Negative for blurred vision, double vision and photophobia.  Respiratory: Negative.  Negative for cough, shortness of breath and wheezing.   Cardiovascular: Negative.  Negative for chest pain, palpitations and leg swelling.  Gastrointestinal: Negative.  Negative for heartburn, nausea and vomiting.  Genitourinary:       SEE HPI  Musculoskeletal: Negative.  Negative for myalgias.  Neurological: Negative.  Negative for  dizziness, focal weakness, seizures and headaches.  Psychiatric/Behavioral: Negative.  Negative for suicidal ideas.    Past Medical History:  Diagnosis Date  . Breast cancer ([place])   . Breast cancer, stage 2 ([name]) 02/23/2011  . COPD (chronic obstructive pulmonary disease) ([place])   . Hypertension   . TIA (transient ischemic attack) 11/25/2011   "first time" (11/25/2011)    Past Surgical History:  Procedure Laterality Date  . BREAST BIOPSY  2007   left  . MASTECTOMY  09/2005   left  . TONSILLECTOMY AND ADENOIDECTOMY     "when I was little" (11/25/2011)  . TUBAL LIGATION  1993    Family History  Problem Relation Age of Onset  . COPD Father   . Sickle cell anemia Cousin        maternal cousin    Social History Reviewed with no changes to be made today.   Outpatient Medications Prior to Visit  Medication Sig Dispense Refill  . aspirin EC 81 MG EC tablet Take 1 tablet (81 mg total) by mouth daily. 30 tablet 0  . lisinopril (PRINIVIL,ZESTRIL) 10 MG tablet Take 1 tablet (10 mg total) by mouth daily. 30 tablet 6  . Multiple Vitamin (MULTIVITAMIN WITH MINERALS) TABS tablet Take 1 tablet by mouth daily.    . nicotine (NICODERM CQ - DOSED IN MG/24 HOURS) 14 mg/24hr patch Place 1 patch (14 mg total) onto the skin daily. 28 patch 0  . albuterol (PROVENTIL HFA) 108 (90 Base) MCG/ACT inhaler Inhale into the lungs every 6 (six) hours as needed for wheezing or shortness of breath.    Marland Kitchen amLODipine (NORVASC) 5 MG tablet Take 1 tablet (5 mg total) by mouth daily. 30 tablet 2  . atorvastatin (LIPITOR) 40 MG tablet Take 1 tablet (40 mg total) by mouth daily at 6 PM. 30 tablet 2  . furosemide (LASIX) 20 MG tablet Take 1 tablet (20 mg total) by mouth daily. 30 tablet 1  . spironolactone (ALDACTONE) 25 MG tablet Take 1 tablet (25 mg total) by mouth daily. 30 tablet 1   No facility-administered medications prior to visit.     Allergies  Allergen Reactions  . Penicillins Other (See Comments)    Pt  had slight throat swelling Has patient had a PCN reaction causing immediate rash, facial/tongue/throat swelling, SOB or lightheadedness with hypotension: No Has patient had a PCN reaction causing severe rash involving mucus membranes or skin necrosis: No Has patient had a PCN reaction that required hospitalization No Has patient had a PCN reaction occurring within the last 10 years: No If all of the above answers are "NO", then may proceed with Cephalosporin use.        Objective:    BP (!) 156/93 (BP Location: Right Arm, Patient Position: Sitting, Cuff Size: Normal)   Pulse 84   Temp 98.2 F (36.8 C) (Oral)   Ht 5\' 5"  (1.651 m)   Wt 131 lb 6.4 oz (59.6 kg)   LMP 02/18/2015 (Exact Date) Comment: MAYBE ONCE A YEAR  SpO2 95%   BMI 21.87 kg/m  Wt Readings from Last 3 Encounters:  09/04/17 131 lb (59.4 kg)  09/04/17 131 lb 6.4 oz (59.6 kg)  08/16/17 135 lb (61.2 kg)    Physical Exam  Constitutional: She is oriented to person, place, and time. She appears well-developed and well-nourished. She is cooperative.  HENT:  Head: Normocephalic and atraumatic.  Eyes: EOM are normal.  Neck: Normal range of motion.  Cardiovascular: Normal rate, regular rhythm and normal heart sounds. Exam reveals no gallop and no friction rub.  No murmur heard. Pulmonary/Chest: Effort normal and breath sounds normal. No tachypnea. No respiratory distress. She has no decreased breath sounds. She has no wheezes. She has no rhonchi. She has no rales. She exhibits no tenderness.  Abdominal: Soft. Bowel sounds are normal.  Musculoskeletal: Normal range of motion. She exhibits no edema.  Neurological: She is alert and oriented to person, place, and time. Coordination normal.  Skin: Skin is warm and dry.  Psychiatric: She has a normal mood and affect. Her behavior is normal. Judgment and thought content normal.  Nursing note and vitals reviewed.        Patient has been counseled extensively about nutrition  and exercise as well as the importance of adherence with medications and regular follow-up. The patient was given clear instructions to go to ER or return to medical center if symptoms don't improve, worsen or new problems develop. The patient verbalized understanding.   Follow-up: Return for HTN/COPD, Needs appointment with financial representative.Gildardo Pounds, FNP-BC [facility] and [facility] [place], [place]   09/06/2017, 9:11 AM

## 2017-09-04 NOTE — Telephone Encounter (Signed)
Met with patient when she was in the clinic today and completed a SCAT application

## 2017-09-04 NOTE — ED Triage Notes (Signed)
Pt presents for evaluation of R knee, ankle pain and lower back pain. States she tripped and fell in [employer] yesterday. Pt is ambulatory, states she would just like an xray.

## 2017-09-04 NOTE — Discharge Instructions (Signed)
1. Medications: Alternate 600 mg of ibuprofen and [phone] mg of Tylenol every 3 hours as needed for pain. Do not exceed 4000 mg of Tylenol daily.  Take ibuprofen with food to avoid upset stomach issues.  2. Treatment: rest, ice, elevate and use brace and crutches, drink plenty of fluids, gentle stretching 3. Follow Up: Please followup with orthopedics as directed or your PCP in 1 week if no improvement for discussion of your diagnoses and further evaluation after today's visit;Please return to the ER for worsening symptoms or other concerns such as worsening swelling, redness of the skin, fevers, loss of pulses, or loss of feeling

## 2017-09-04 NOTE — ED Triage Notes (Signed)
PT anxious pacing to desk and back to room

## 2017-09-04 NOTE — ED Provider Notes (Signed)
[facility] EMERGENCY DEPARTMENT Provider Note   CSN: [id number] Arrival date & time: 09/04/17  1243     History   Chief Complaint Chief Complaint  Patient presents with  . Fall    HPI Sabrina Holland is a 47 y.o. female with history of COPD and hypertension who presents today for evaluation of acute onset, constant right-sided low back pain, right knee and ankle pain secondary to injury yesterday.  She states that she was at a grocery store yesterday and slipped on some water on the floor.  She states that she flexed her right knee and landed on her buttocks.  Denies head injury or loss of consciousness.  She states that since then she has had aching pain to the low back, right knee, and right ankle which worsens with ambulation.  Pain is aching in nature.  Notes numbness and tingling of the right foot intermittently.  Has not taken any medications for her pain.  The history is provided by the patient.    Past Medical History:  Diagnosis Date  . Breast cancer ([place])   . Breast cancer, stage 2 ([place]) 02/23/2011  . COPD (chronic obstructive pulmonary disease) ([place])   . Hypertension   . TIA (transient ischemic attack) 11/25/2011   "first time" (11/25/2011)    Patient Active Problem List   Diagnosis Date Noted  . Cardiomyopathy ([place]) 07/04/2017  . COPD (chronic obstructive pulmonary disease) ([place])   . Acute on chronic combined systolic and diastolic CHF (congestive heart failure) ([place])   . Anemia 06/20/2017  . Polysubstance abuse ([place]) 11/26/2011  . TIA (transient ischemic attack) 11/25/2011  . HTN (hypertension) 11/25/2011  . Sinusitis 11/25/2011  . Vitamin B 12 deficiency 07/18/2011  . History of breast cancer 02/23/2011    Past Surgical History:  Procedure Laterality Date  . BREAST BIOPSY  2007   left  . MASTECTOMY  09/2005   left  . TONSILLECTOMY AND ADENOIDECTOMY     "when I was little" (11/25/2011)  . TUBAL LIGATION  1993     OB History      Gravida  5   Para  4   Term  4   Preterm  0   AB  1   Living        SAB  1   TAB  0   Ectopic  0   Multiple      Live Births               Home Medications    Prior to Admission medications   Medication Sig Start Date End Date Taking? Authorizing Provider  albuterol (PROVENTIL HFA) 108 (90 Base) MCG/ACT inhaler Inhale 1-2 puffs into the lungs every 6 (six) hours as needed for wheezing or shortness of breath. 09/04/17  Yes Gildardo Pounds, NP  amLODipine (NORVASC) 5 MG tablet Take 1 tablet (5 mg total) by mouth daily. 09/04/17  Yes Gildardo Pounds, NP  aspirin EC 81 MG EC tablet Take 1 tablet (81 mg total) by mouth daily. 06/24/17  Yes Reyne Dumas, MD  atorvastatin (LIPITOR) 40 MG tablet Take 1 tablet (40 mg total) by mouth daily at 6 PM. 09/04/17  Yes Gildardo Pounds, NP  furosemide (LASIX) 20 MG tablet Take 1 tablet (20 mg total) by mouth daily. 09/04/17  Yes Gildardo Pounds, NP  lisinopril (PRINIVIL,ZESTRIL) 10 MG tablet Take 1 tablet (10 mg total) by mouth daily. 07/04/17  Yes Erlene Quan, PA-C  Multiple Vitamin (MULTIVITAMIN WITH MINERALS) TABS tablet Take 1 tablet by mouth daily.   Yes [provider]  nicotine (NICODERM CQ - DOSED IN MG/24 HOURS) 14 mg/24hr patch Place 1 patch (14 mg total) onto the skin daily. 06/24/17  Yes Reyne Dumas, MD  spironolactone (ALDACTONE) 25 MG tablet Take 1 tablet (25 mg total) by mouth daily. 09/04/17  Yes Gildardo Pounds, NP    Family History Family History  Problem Relation Age of Onset  . COPD Father   . Sickle cell anemia Cousin        maternal cousin    Social History Social History   Tobacco Use  . Smoking status: Current Some Day Smoker    Packs/day: 0.00    Years: 20.00    Pack years: 0.00    Types: Cigarettes  . Smokeless tobacco: Never Used  . Tobacco comment: 1 or 2 cigarettes a day- approx 1 pack week  Substance Use Topics  . Alcohol use: Yes    Alcohol/week: 12.0 standard drinks     Types: 12 Cans of beer per week    Comment: occasionally  . Drug use: Yes    Types: Marijuana, Cocaine    Comment: marijuana use 2 blunts a week- less than an oz /week     Allergies   Penicillins   Review of Systems Review of Systems  Constitutional: Negative for chills and fever.  Eyes: Negative for visual disturbance.  Musculoskeletal: Positive for arthralgias and back pain.  Neurological: Positive for numbness. Negative for syncope, weakness and headaches.     Physical Exam Updated Vital Signs BP 132/81 (BP Location: Right Arm)   Pulse 77   Temp 97.6 F (36.4 C) (Oral)   Resp 18   Ht 5\' 5"  (1.651 m)   Wt 59.4 kg   LMP 02/18/2015 (Exact Date) Comment: MAYBE ONCE A YEAR  SpO2 98%   BMI 21.80 kg/m   Physical Exam  Constitutional: She appears well-developed and well-nourished. No distress.  HENT:  Head: Normocephalic and atraumatic.  Eyes: Conjunctivae are normal. Right eye exhibits no discharge. Left eye exhibits no discharge.  Neck: No JVD present. No tracheal deviation present.  Cardiovascular: Normal rate and intact distal pulses.  2+ DP/PT pulses bilaterally, Homans sign absent bilaterally, no lower extremity edema, no palpable cords, compartments are soft   Pulmonary/Chest: Effort normal.  Abdominal: She exhibits no distension.  Musculoskeletal: Normal range of motion. She exhibits tenderness. She exhibits no edema.  There is tenderness to palpation of the midline lumbar spine around the levels of L4-S1 with right-sided paralumbar muscle tenderness noted.  Right SI joint tenderness noted.  There is also tenderness to palpation of the anterior aspect of the right knee and along the medial and lateral joint lines.  No varus or valgus instability, no ligamentous laxity, negative anterior/posterior drawer test.  Mild tenderness to palpation of the right lateral malleolus.  Examination of the Achilles tendon is within normal limits.  5/5 strength of BUE and BLE major  muscle groups.  Neurological: She is alert. No sensory deficit.  Fluent speech, no facial droop, sensation intact to soft touch of bilateral lower extremities.  Ambulates with a very mildly antalgic gait but exhibits good balance.  Able to Heel Walk and Toe Walk without difficulty.  Skin: Skin is warm and dry. No erythema.  Psychiatric: She has a normal mood and affect. Her behavior is normal.  Nursing note and vitals reviewed.    ED Treatments / Results  Labs (all labs ordered are listed, but only abnormal results are displayed) Labs Reviewed - No data to display  EKG None  Radiology No results found.  Procedures Procedures (including critical care time)  Medications Ordered in ED Medications - No data to display   Initial Impression / Assessment and Plan / ED Course  I have reviewed the triage vital signs and the nursing notes.  Pertinent labs & imaging results that were available during my care of the patient were reviewed by me and considered in my medical decision making (see chart for details).     Patient with right-sided low back pain, right knee and right ankle pain secondary to mechanical fall yesterday.  She is afebrile, vital signs are stable.  She is nontoxic in appearance.  She is neurovascularly intact.  She is ambulatory without difficulty.  No red flag signs concerning for cauda equina or spinal abscess. At the time of my assessment, the patient declined any x-rays and stated "I just want to go home.  I do not think anything is broken ".  I think this is reasonable and I have a low suspicion of acute osseous abnormality such as fracture dislocation given the patient has full range of motion of her extremities and is able to bear weight without difficulty. She does understand the risk of possible missed fracture diagnosis. RICE therapy indicated and discussed with patient.  Offered ankle brace, knee sleeve, and crutches.  Recommend follow-up with PCP or orthopedist  if symptoms persist.  Discussed strict ED return precautions. Pt verbalized understanding of and agreement with plan and is safe for discharge home at this time.   Final Clinical Impressions(s) / ED Diagnoses   Final diagnoses:  Fall, initial encounter  Acute right-sided low back pain without sciatica  Acute pain of right knee  Acute right ankle pain    ED Discharge Orders    None       Debroah Baller 09/05/17 1005    Noemi Chapel, MD 09/06/17 1029

## 2017-09-05 ENCOUNTER — Telehealth: Payer: Self-pay

## 2017-09-05 LAB — URINE CYTOLOGY ANCILLARY ONLY
CHLAMYDIA, DNA PROBE: NEGATIVE
Neisseria Gonorrhea: NEGATIVE
Trichomonas: POSITIVE — AB

## 2017-09-05 NOTE — Telephone Encounter (Signed)
[employer] application faxed to [employer] eligibility  

## 2017-09-06 ENCOUNTER — Encounter: Payer: Self-pay | Admitting: Nurse Practitioner

## 2017-09-06 ENCOUNTER — Telehealth: Payer: Self-pay

## 2017-09-06 LAB — URINE CYTOLOGY ANCILLARY ONLY
BACTERIAL VAGINITIS: POSITIVE — AB
CANDIDA VAGINITIS: NEGATIVE

## 2017-09-06 MED ORDER — METRONIDAZOLE 500 MG PO TABS: 500.0000 mg | ORAL_TABLET | Freq: Two times a day (BID) | ORAL | 0 refills | Status: AC

## 2017-09-06 NOTE — Telephone Encounter (Signed)
CMA attempt to call patient to inform on urine results.  No answer and left a VM for patient to call back.  If patient call back, please inform:  Urine cytology positive for trichomonas. Will send prescription to the pharmacy

## 2017-09-06 NOTE — Telephone Encounter (Signed)
-----   Message from Gildardo Pounds, NP sent at 09/06/2017  9:14 AM EDT ----- Urine cytology positive for trichomonas. Will send prescription to the pharmacy

## 2017-09-07 ENCOUNTER — Other Ambulatory Visit: Payer: Self-pay | Admitting: Nurse Practitioner

## 2017-09-07 NOTE — Progress Notes (Signed)
Patient call back for lab results. CMA spoke to patient to inform. Patient understood.

## 2017-09-08 ENCOUNTER — Telehealth (HOSPITAL_COMMUNITY): Payer: Self-pay

## 2017-09-08 NOTE — Telephone Encounter (Signed)
Tried to reach I left a message to call [facility]

## 2017-09-08 NOTE — Progress Notes (Signed)
CMA spoke to patient. Patient understood.

## 2017-09-12 ENCOUNTER — Telehealth: Payer: Self-pay

## 2017-09-12 NOTE — Telephone Encounter (Signed)
Call received from Adena with [employer] who confirmed that the application has been received and the patient has been scheduled for an assessment.

## 2017-09-13 ENCOUNTER — Ambulatory Visit: Payer: Self-pay

## 2017-09-26 ENCOUNTER — Other Ambulatory Visit (HOSPITAL_COMMUNITY): Payer: Self-pay

## 2017-10-02 MED FILL — FUROSEMIDE 20 MG TABLET: 20 | 30 days supply | Qty: 30 | Fill #0

## 2017-10-02 MED FILL — !VENTOLIN HFA INHALER: 108 (90 BAS | 25 days supply | Qty: 18 | Fill #0

## 2017-10-02 MED FILL — metroNIDAZOLE 500 MG TABS: 500 | 7 days supply | Qty: 14 | Fill #0

## 2017-10-02 MED FILL — SPIRONOLACTONE 25 MG TABLET: 25 | 30 days supply | Qty: 30 | Fill #0

## 2017-10-02 MED FILL — ATORVASTATIN CALCIUM 40 MG: 40 | 30 days supply | Qty: 30 | Fill #0

## 2017-10-13 ENCOUNTER — Ambulatory Visit: Payer: Self-pay | Admitting: Cardiovascular Disease

## 2017-10-20 ENCOUNTER — Other Ambulatory Visit (HOSPITAL_COMMUNITY): Payer: Self-pay

## 2017-11-06 ENCOUNTER — Emergency Department (HOSPITAL_COMMUNITY)
Admission: EM | Admit: 2017-11-06 | Discharge: 2017-11-06 | Disposition: A | Discharge status: Home / Self Care | Payer: Self-pay | Attending: Emergency Medicine | Admitting: Emergency Medicine

## 2017-11-06 ENCOUNTER — Other Ambulatory Visit: Payer: Self-pay

## 2017-11-06 ENCOUNTER — Emergency Department (HOSPITAL_COMMUNITY): Payer: Self-pay

## 2017-11-06 ENCOUNTER — Encounter (HOSPITAL_COMMUNITY): Payer: Self-pay

## 2017-11-06 DIAGNOSIS — F17210 Nicotine dependence, cigarettes, uncomplicated: Secondary | ICD-10-CM | POA: Insufficient documentation

## 2017-11-06 DIAGNOSIS — R05 Cough: Secondary | ICD-10-CM | POA: Insufficient documentation

## 2017-11-06 DIAGNOSIS — R0602 Shortness of breath: Secondary | ICD-10-CM | POA: Insufficient documentation

## 2017-11-06 DIAGNOSIS — J449 Chronic obstructive pulmonary disease, unspecified: Secondary | ICD-10-CM | POA: Insufficient documentation

## 2017-11-06 DIAGNOSIS — I10 Essential (primary) hypertension: Secondary | ICD-10-CM | POA: Insufficient documentation

## 2017-11-06 DIAGNOSIS — R059 Cough, unspecified: Secondary | ICD-10-CM

## 2017-11-06 LAB — BASIC METABOLIC PANEL
Anion gap: 9 (ref 5–15)
BUN: 11 mg/dL (ref 6–20)
CHLORIDE: 106 mmol/L (ref 98–111)
CO2: 27 mmol/L (ref 22–32)
CREATININE: 0.99 mg/dL (ref 0.44–1.00)
Calcium: 9.6 mg/dL (ref 8.9–10.3)
GFR calc Af Amer: >60 mL/min (ref >60)
GFR calc non Af Amer: >60 mL/min (ref >60)
Glucose, Bld: 109 mg/dL — ABNORMAL HIGH (ref 70–99)
Potassium: 4.2 mmol/L (ref 3.5–5.1)
Sodium: 142 mmol/L (ref 135–145)

## 2017-11-06 LAB — CBC
HEMATOCRIT: 43.9 % (ref 36.0–46.0)
Hemoglobin: 13.4 g/dL (ref 12.0–15.0)
MCH: 27.5 pg (ref 26.0–34.0)
MCHC: 30.5 g/dL (ref 30.0–36.0)
MCV: 90 fL (ref 80.0–100.0)
Platelets: 391 10*3/uL (ref 150–400)
RBC: 4.88 MIL/uL (ref 3.87–5.11)
RDW: 14.2 % (ref 11.5–15.5)
WBC: 5.1 10*3/uL (ref 4.0–10.5)
nRBC: 0 % (ref 0.0–0.2)

## 2017-11-06 LAB — BRAIN NATRIURETIC PEPTIDE: B Natriuretic Peptide: 456.2 pg/mL — ABNORMAL HIGH (ref 0.0–100.0)

## 2017-11-06 MED ORDER — ALBUTEROL SULFATE (2.5 MG/3ML) 0.083% IN NEBU
5.0000 mg | INHALATION_SOLUTION | Freq: Once | RESPIRATORY_TRACT | Status: AC
  Administered 2017-11-06: 5 mg via RESPIRATORY_TRACT
  Filled 2017-11-06: qty 6

## 2017-11-06 MED ORDER — GUAIFENESIN-CODEINE 100-10 MG/5ML PO SYRP: 5.0000 mL | ORAL_SOLUTION | Freq: Three times a day (TID) | ORAL | 0 refills | Status: DC | PRN

## 2017-11-06 MED ORDER — MORPHINE SULFATE (PF) 4 MG/ML IV SOLN
6.0000 mg | Freq: Once | INTRAVENOUS | Status: AC
  Administered 2017-11-06: 6 mg via INTRAVENOUS
  Filled 2017-11-06: qty 2

## 2017-11-06 MED ORDER — FAMOTIDINE 20 MG PO TABS: 20.0000 mg | ORAL_TABLET | Freq: Two times a day (BID) | ORAL | 0 refills | Status: DC

## 2017-11-06 MED ORDER — KETOROLAC TROMETHAMINE 15 MG/ML IJ SOLN
15.0000 mg | Freq: Once | INTRAMUSCULAR | Status: AC
  Administered 2017-11-06: 15 mg via INTRAVENOUS
  Filled 2017-11-06: qty 1

## 2017-11-06 NOTE — ED Triage Notes (Signed)
Patient c/o a productive cough with clear and yellow sputum x 3 weeks. Patient states she has increased SOB in the past 2-3 days. Patient states she has a history of COPD.

## 2017-11-06 NOTE — ED Provider Notes (Signed)
[facility] DEPT Provider Note   CSN: [id number] Arrival date & time: 11/06/17  0947     History   Chief Complaint Chief Complaint  Patient presents with  . Shortness of Breath  . Cough    HPI Sabrina Holland is a 47 y.o. female.  HPI  47 year old female with cough.  Persistent for about 2 weeks.  Occasionally productive for whitish sputum and sometimes dry.  Mild shortness of breath.  She reports she has been using her albuterol inhaler but it does not really seem to help.  Denies any chest pain.  So abdominal soreness which she attributes to coughing.  Sometimes she will cough so hard that she has posttussive emesis.  Cough seems to be worse at night.  She was unable to get any sleep last night which finally prompted her to come the emergency room.  No unusual leg pain or swelling.  No fevers.  Past Medical History:  Diagnosis Date  . Breast cancer ([place])   . Breast cancer, stage 2 ([place]) 02/23/2011  . COPD (chronic obstructive pulmonary disease) ([place])   . Hypertension   . TIA (transient ischemic attack) 11/25/2011   "first time" (11/25/2011)    Patient Active Problem List   Diagnosis Date Noted  . Cardiomyopathy ([place]) 07/04/2017  . COPD (chronic obstructive pulmonary disease) ([place])   . Acute on chronic combined systolic and diastolic CHF (congestive heart failure) ([place])   . Anemia 06/20/2017  . Polysubstance abuse ([place]) 11/26/2011  . TIA (transient ischemic attack) 11/25/2011  . HTN (hypertension) 11/25/2011  . Sinusitis 11/25/2011  . Vitamin B 12 deficiency 07/18/2011  . History of breast cancer 02/23/2011    Past Surgical History:  Procedure Laterality Date  . BREAST BIOPSY  2007   left  . MASTECTOMY  09/2005   left  . TONSILLECTOMY AND ADENOIDECTOMY     "when I was little" (11/25/2011)  . TUBAL LIGATION  1993     OB History    Gravida  5   Para  4   Term  4   Preterm  0   AB  1   Living        SAB  1   TAB   0   Ectopic  0   Multiple      Live Births               Home Medications    Prior to Admission medications   Medication Sig Start Date End Date Taking? Authorizing Provider  albuterol (PROVENTIL HFA) 108 (90 Base) MCG/ACT inhaler Inhale 1-2 puffs into the lungs every 6 (six) hours as needed for wheezing or shortness of breath. 09/04/17  Yes Gildardo Pounds, NP  aspirin EC 81 MG EC tablet Take 1 tablet (81 mg total) by mouth daily. 06/24/17  Yes Reyne Dumas, MD  atorvastatin (LIPITOR) 40 MG tablet Take 1 tablet (40 mg total) by mouth daily at 6 PM. 09/04/17  Yes Gildardo Pounds, NP  furosemide (LASIX) 20 MG tablet Take 1 tablet (20 mg total) by mouth daily. 09/04/17  Yes Gildardo Pounds, NP  Multiple Vitamin (MULTIVITAMIN WITH MINERALS) TABS tablet Take 1 tablet by mouth daily.   Yes [provider]  nicotine (NICODERM CQ - DOSED IN MG/24 HOURS) 14 mg/24hr patch Place 1 patch (14 mg total) onto the skin daily. 06/24/17  Yes Reyne Dumas, MD  spironolactone (ALDACTONE) 25 MG tablet Take 1 tablet (25 mg total) by mouth  daily. 09/04/17  Yes Gildardo Pounds, NP  amLODipine (NORVASC) 5 MG tablet Take 1 tablet (5 mg total) by mouth daily. Patient not taking: Reported on 11/06/2017 09/04/17   Gildardo Pounds, NP  lisinopril (PRINIVIL,ZESTRIL) 10 MG tablet Take 1 tablet (10 mg total) by mouth daily. Patient not taking: Reported on 11/06/2017 07/04/17   Erlene Quan, PA-C    Family History Family History  Problem Relation Age of Onset  . COPD Father   . Sickle cell anemia Cousin        maternal cousin    Social History Social History   Tobacco Use  . Smoking status: Current Some Day Smoker    Packs/day: 0.00    Years: 20.00    Pack years: 0.00    Types: Cigarettes  . Smokeless tobacco: Never Used  . Tobacco comment: 1 or 2 cigarettes a day- approx 1 pack week  Substance Use Topics  . Alcohol use: Yes    Alcohol/week: 12.0 standard drinks    Types: 12 Cans of beer  per week    Comment: occasionally  . Drug use: Yes    Types: Marijuana, Cocaine    Comment: patient states she uses cocaine and marijuana daily     Allergies   Penicillins   Review of Systems Review of Systems  All systems reviewed and negative, other than as noted in HPI.  Physical Exam Updated Vital Signs BP (!) 139/94   Pulse 86   Temp (!) 97.5 F (36.4 C) (Oral)   Resp 17   Ht 5\' 5"  (1.651 m)   Wt 62.6 kg   LMP 02/18/2015 (Exact Date) Comment: MAYBE ONCE A YEAR  SpO2 97%   BMI 22.96 kg/m   Physical Exam  Constitutional: She appears well-developed and well-nourished. No distress.  HENT:  Head: Normocephalic and atraumatic.  Eyes: Conjunctivae are normal. Right eye exhibits no discharge. Left eye exhibits no discharge.  Neck: Neck supple.  Cardiovascular: Normal rate, regular rhythm and normal heart sounds. Exam reveals no gallop and no friction rub.  No murmur heard. Pulmonary/Chest: Effort normal and breath sounds normal. No respiratory distress.  Abdominal: Soft. She exhibits no distension. There is no tenderness.  Musculoskeletal: She exhibits no edema or tenderness.       Right lower leg: She exhibits no edema.  Lower extremities symmetric as compared to each other. No calf tenderness. Negative Homan's. No palpable cords.   Neurological: She is alert.  Skin: Skin is warm and dry.  Psychiatric: She has a normal mood and affect. Her behavior is normal. Thought content normal.  Nursing note and vitals reviewed.    ED Treatments / Results  Labs (all labs ordered are listed, but only abnormal results are displayed) Labs Reviewed  BASIC METABOLIC PANEL - Abnormal; Notable for the following components:      Result Value   Glucose, Bld 109 (*)    All other components within normal limits  BRAIN NATRIURETIC PEPTIDE - Abnormal; Notable for the following components:   B Natriuretic Peptide 456.2 (*)    All other components within normal limits  CBC     EKG EKG Interpretation  Date/Time:  Monday November 06 2017 09:51:55 EDT Ventricular Rate:  107 PR Interval:    QRS Duration: 84 QT Interval:  373 QTC Calculation: 498 R Axis:   91 Text Interpretation:  Sinus tachycardia Right atrial enlargement Borderline right axis deviation Left ventricular hypertrophy Borderline prolonged QT interval Confirmed by Virgel Manifold [phone])  on 11/06/2017 10:20:17 AM   Radiology Dg Chest 2 View  Result Date: 11/06/2017 CLINICAL DATA:  Shortness of breath with cough and congestion EXAM: CHEST - 2 VIEW COMPARISON:  Chest radiograph and chest CT June 20, 2017 FINDINGS: There is no edema or consolidation. The heart size and pulmonary vascularity are normal. No adenopathy. No bone lesions. IMPRESSION: No edema or consolidation. Electronically Signed   By: Lowella Grip III M.D.   On: 11/06/2017 11:08    Procedures Procedures (including critical care time)  Medications Ordered in ED Medications  albuterol (PROVENTIL) (2.5 MG/3ML) 0.083% nebulizer solution 5 mg (5 mg Nebulization Given 11/06/17 1035)  morphine 4 MG/ML injection 6 mg (6 mg Intravenous Given 11/06/17 1043)  ketorolac (TORADOL) 15 MG/ML injection 15 mg (15 mg Intravenous Given 11/06/17 1043)     Initial Impression / Assessment and Plan / ED Course  I have reviewed the triage vital signs and the nursing notes.  Pertinent labs & imaging results that were available during my care of the patient were reviewed by me and considered in my medical decision making (see chart for details).  47 year old female with a persistent cough.  She has an ACE inhibitor listed on her medication list but she says that she is not taking it.  She does not appear to be volume overloaded.  She sounds clear on exam.  Chest x-ray without acute abnormality.  Hx of COPD, but I do not appreciate any wheezing. Plan continuation of home meds. Will give atrial of pepcid although she doesn't give other clear GERD  symptoms. PRN cough medication.     Final Clinical Impressions(s) / ED Diagnoses   Final diagnoses:  Cough    ED Discharge Orders    None       Virgel Manifold, MD 11/06/17 1241

## 2017-11-07 ENCOUNTER — Encounter (HOSPITAL_COMMUNITY): Payer: Self-pay

## 2017-11-07 ENCOUNTER — Emergency Department (HOSPITAL_COMMUNITY): Payer: Self-pay

## 2017-11-07 ENCOUNTER — Emergency Department (HOSPITAL_COMMUNITY)
Admission: EM | Admit: 2017-11-07 | Discharge: 2017-11-07 | Disposition: A | Discharge status: Home / Self Care | Payer: Self-pay | Attending: Emergency Medicine | Admitting: Emergency Medicine

## 2017-11-07 ENCOUNTER — Encounter (HOSPITAL_COMMUNITY): Payer: Self-pay | Admitting: Emergency Medicine

## 2017-11-07 DIAGNOSIS — R0602 Shortness of breath: Secondary | ICD-10-CM | POA: Insufficient documentation

## 2017-11-07 DIAGNOSIS — Z79899 Other long term (current) drug therapy: Secondary | ICD-10-CM | POA: Insufficient documentation

## 2017-11-07 DIAGNOSIS — Z7982 Long term (current) use of aspirin: Secondary | ICD-10-CM | POA: Insufficient documentation

## 2017-11-07 DIAGNOSIS — I110 Hypertensive heart disease with heart failure: Secondary | ICD-10-CM | POA: Insufficient documentation

## 2017-11-07 DIAGNOSIS — Z5321 Procedure and treatment not carried out due to patient leaving prior to being seen by health care provider: Secondary | ICD-10-CM | POA: Insufficient documentation

## 2017-11-07 DIAGNOSIS — J329 Chronic sinusitis, unspecified: Secondary | ICD-10-CM | POA: Insufficient documentation

## 2017-11-07 DIAGNOSIS — Z853 Personal history of malignant neoplasm of breast: Secondary | ICD-10-CM | POA: Insufficient documentation

## 2017-11-07 DIAGNOSIS — J449 Chronic obstructive pulmonary disease, unspecified: Secondary | ICD-10-CM | POA: Insufficient documentation

## 2017-11-07 DIAGNOSIS — I5043 Acute on chronic combined systolic (congestive) and diastolic (congestive) heart failure: Secondary | ICD-10-CM | POA: Insufficient documentation

## 2017-11-07 DIAGNOSIS — F17210 Nicotine dependence, cigarettes, uncomplicated: Secondary | ICD-10-CM | POA: Insufficient documentation

## 2017-11-07 LAB — BASIC METABOLIC PANEL
ANION GAP: 8 (ref 5–15)
BUN: 12 mg/dL (ref 6–20)
CALCIUM: 9.6 mg/dL (ref 8.9–10.3)
CO2: 30 mmol/L (ref 22–32)
Chloride: 102 mmol/L (ref 98–111)
Creatinine, Ser: 0.75 mg/dL (ref 0.44–1.00)
GFR calc Af Amer: >60 mL/min (ref >60)
GFR calc non Af Amer: >60 mL/min (ref >60)
GLUCOSE: 125 mg/dL — AB (ref 70–99)
Potassium: 4 mmol/L (ref 3.5–5.1)
SODIUM: 140 mmol/L (ref 135–145)

## 2017-11-07 LAB — CBC
HCT: 42.5 % (ref 36.0–46.0)
Hemoglobin: 13.1 g/dL (ref 12.0–15.0)
MCH: 27.8 pg (ref 26.0–34.0)
MCHC: 30.8 g/dL (ref 30.0–36.0)
MCV: 90 fL (ref 80.0–100.0)
NRBC: 0 % (ref 0.0–0.2)
PLATELETS: 387 10*3/uL (ref 150–400)
RBC: 4.72 MIL/uL (ref 3.87–5.11)
RDW: 14.2 % (ref 11.5–15.5)
WBC: 6.5 10*3/uL (ref 4.0–10.5)

## 2017-11-07 MED ORDER — SODIUM CHLORIDE 0.9 % IJ SOLN
INTRAMUSCULAR | Status: AC
  Filled 2017-11-07: qty 50

## 2017-11-07 MED ORDER — DM-GUAIFENESIN ER 30-600 MG PO TB12
1.0000 | ORAL_TABLET | Freq: Two times a day (BID) | ORAL | Status: DC
  Administered 2017-11-07: 1 via ORAL
  Filled 2017-11-07: qty 1

## 2017-11-07 MED ORDER — IOPAMIDOL (ISOVUE-370) INJECTION 76%
INTRAVENOUS | Status: AC
  Filled 2017-11-07: qty 100

## 2017-11-07 MED ORDER — IOPAMIDOL (ISOVUE-370) INJECTION 76%
100.0000 mL | Freq: Once | INTRAVENOUS | Status: AC | PRN
  Administered 2017-11-07: 100 mL via INTRAVENOUS

## 2017-11-07 MED ORDER — PREDNISONE 20 MG PO TABS
60.0000 mg | ORAL_TABLET | Freq: Once | ORAL | Status: AC
  Administered 2017-11-07: 60 mg via ORAL
  Filled 2017-11-07: qty 3

## 2017-11-07 MED ORDER — IPRATROPIUM-ALBUTEROL 0.5-2.5 (3) MG/3ML IN SOLN
3.0000 mL | Freq: Once | RESPIRATORY_TRACT | Status: AC
  Administered 2017-11-07: 3 mL via RESPIRATORY_TRACT
  Filled 2017-11-07: qty 3

## 2017-11-07 NOTE — ED Notes (Signed)
Provider at bedside

## 2017-11-07 NOTE — ED Provider Notes (Signed)
I saw and evaluated the patient, reviewed the resident's note and I agree with the findings and plan.  EKG: None 47 year old female presents with cough and congestion for about 2 weeks.  Had increasing dyspnea today when walking to the bus.  Does have history of COPD.  Awaiting results of chest CT   Lacretia Leigh, MD 11/07/17 1042

## 2017-11-07 NOTE — ED Notes (Signed)
Called for room no answer from lobby not seen in lobby.

## 2017-11-07 NOTE — Discharge Instructions (Signed)
Ms. Fryberger,   It was a pleasure taking care of you here in the ED.  No symptoms are most likely due to a viral upper respiratory infection.  This should get better within 7 to 10 days.  I will advise that he continue taking the Mucinex or Robitussin.  For the drip at the back of your throat you can take Claritin or Zyrtec.

## 2017-11-07 NOTE — ED Provider Notes (Addendum)
[facility] DEPT Provider Note   CSN: [id number] Arrival date & time: 11/07/17  4034     History   Chief Complaint Chief Complaint  Patient presents with  . Shortness of Breath    HPI Sabrina Holland is a 47 y.o. female with medical history significant for COPD, breast cancer status post lumpectomy and chemotherapy, hypertension presenting for evaluation of cough.  Patient reports that for the last 3 weeks she has been worsening cough productive of light yellow sputum.  2 days ago she began experiencing shortness of breath as well as dyspnea on exertion with associated body aches, subjective chills, myalgia, headache, lacrimation, rhinorrhea present with yellow discharge.  Patient also complains of pleuritic chest pain but denies calf pain or immobilization.  She has been evaluated in the emergency department multiple times yesterday was discharged with Robitussin however she still reports of ongoing symptoms.  Past Medical History:  Diagnosis Date  . Breast cancer ([place])   . Breast cancer, stage 2 ([place]) 02/23/2011  . COPD (chronic obstructive pulmonary disease) ([place])   . Hypertension   . TIA (transient ischemic attack) 11/25/2011   "first time" (11/25/2011)    Patient Active Problem List   Diagnosis Date Noted  . Cardiomyopathy ([place]) 07/04/2017  . COPD (chronic obstructive pulmonary disease) ([place])   . Acute on chronic combined systolic and diastolic CHF (congestive heart failure) ([place])   . Anemia 06/20/2017  . Polysubstance abuse ([place]) 11/26/2011  . TIA (transient ischemic attack) 11/25/2011  . HTN (hypertension) 11/25/2011  . Sinusitis 11/25/2011  . Vitamin B 12 deficiency 07/18/2011  . History of breast cancer 02/23/2011    Past Surgical History:  Procedure Laterality Date  . BREAST BIOPSY  2007   left  . MASTECTOMY  09/2005   left  . TONSILLECTOMY AND ADENOIDECTOMY     "when I was little" (11/25/2011)  . TUBAL LIGATION  1993       OB History    Gravida  5   Para  4   Term  4   Preterm  0   AB  1   Living        SAB  1   TAB  0   Ectopic  0   Multiple      Live Births               Home Medications    Prior to Admission medications   Medication Sig Start Date End Date Taking? Authorizing Provider  albuterol (PROVENTIL HFA) 108 (90 Base) MCG/ACT inhaler Inhale 1-2 puffs into the lungs every 6 (six) hours as needed for wheezing or shortness of breath. 09/04/17  Yes Gildardo Pounds, NP  aspirin EC 81 MG EC tablet Take 1 tablet (81 mg total) by mouth daily. 06/24/17  Yes Reyne Dumas, MD  atorvastatin (LIPITOR) 40 MG tablet Take 1 tablet (40 mg total) by mouth daily at 6 PM. 09/04/17  Yes Gildardo Pounds, NP  famotidine (PEPCID) 20 MG tablet Take 1 tablet (20 mg total) by mouth 2 (two) times daily. 11/06/17  Yes Virgel Manifold, MD  furosemide (LASIX) 20 MG tablet Take 1 tablet (20 mg total) by mouth daily. 09/04/17  Yes Gildardo Pounds, NP  guaiFENesin-codeine (ROBITUSSIN AC) 100-10 MG/5ML syrup Take 5 mLs by mouth 3 (three) times daily as needed for cough. 11/06/17  Yes Virgel Manifold, MD  Multiple Vitamin (MULTIVITAMIN WITH MINERALS) TABS tablet Take 1 tablet by mouth daily.   Yes  [provider]  nicotine (NICODERM CQ - DOSED IN MG/24 HOURS) 14 mg/24hr patch Place 1 patch (14 mg total) onto the skin daily. 06/24/17  Yes Reyne Dumas, MD  spironolactone (ALDACTONE) 25 MG tablet Take 1 tablet (25 mg total) by mouth daily. 09/04/17  Yes Gildardo Pounds, NP  amLODipine (NORVASC) 5 MG tablet Take 1 tablet (5 mg total) by mouth daily. Patient not taking: Reported on 11/06/2017 09/04/17   Gildardo Pounds, NP  lisinopril (PRINIVIL,ZESTRIL) 10 MG tablet Take 1 tablet (10 mg total) by mouth daily. Patient not taking: Reported on 11/06/2017 07/04/17   Erlene Quan, PA-C    Family History Family History  Problem Relation Age of Onset  . COPD Father   . Sickle cell anemia Cousin         maternal cousin    Social History Social History   Tobacco Use  . Smoking status: Current Some Day Smoker    Packs/day: 0.00    Years: 20.00    Pack years: 0.00    Types: Cigarettes  . Smokeless tobacco: Never Used  . Tobacco comment: 1 or 2 cigarettes a day- approx 1 pack week  Substance Use Topics  . Alcohol use: Yes    Alcohol/week: 12.0 standard drinks    Types: 12 Cans of beer per week    Comment: occasionally  . Drug use: Yes    Types: Marijuana, Cocaine    Comment: patient states she uses cocaine and marijuana daily     Allergies   Penicillins   Review of Systems Review of Systems  Constitutional: Positive for chills. Negative for fever.  HENT: Positive for postnasal drip, rhinorrhea, sinus pressure and sinus pain.   Eyes: Negative.   Respiratory: Positive for shortness of breath.   Cardiovascular: Negative.   Gastrointestinal: Negative.   Endocrine: Negative.   Genitourinary: Negative.   Musculoskeletal: Negative.   Skin: Negative.   Neurological: Positive for headaches.  Psychiatric/Behavioral: Negative.      Physical Exam Updated Vital Signs BP (!) 164/106 (BP Location: Right Arm)   Temp 98 F (36.7 C) (Oral)   Resp 20   LMP 02/18/2015 (Exact Date) Comment: MAYBE ONCE A YEAR  SpO2 95%   Physical Exam  Constitutional: She appears well-developed and well-nourished.  HENT:  Head: Normocephalic and atraumatic.  Neck: Normal range of motion.  Cardiovascular: Regular rhythm, S1 normal, S2 normal and normal heart sounds. Tachycardia present.  Pulmonary/Chest: Effort normal and breath sounds normal. No accessory muscle usage. No respiratory distress. She has no decreased breath sounds. She has no wheezes. She has no rhonchi. She has no rales.  Abdominal: Soft. Bowel sounds are normal.  Musculoskeletal: Normal range of motion.       Right lower leg: Normal. She exhibits no tenderness and no edema.       Left lower leg: Normal. She exhibits no  tenderness and no edema.  Neurological: She is alert.  Skin: Skin is warm.  Psychiatric: She has a normal mood and affect. Her behavior is normal.     ED Treatments / Results  Labs (all labs ordered are listed, but only abnormal results are displayed) Labs Reviewed  BASIC METABOLIC PANEL - Abnormal; Notable for the following components:      Result Value   Glucose, Bld 125 (*)    All other components within normal limits  CBC    EKG None  Radiology Dg Chest 2 View  Result Date: 11/06/2017 CLINICAL DATA:  Shortness  of breath with cough and congestion EXAM: CHEST - 2 VIEW COMPARISON:  Chest radiograph and chest CT June 20, 2017 FINDINGS: There is no edema or consolidation. The heart size and pulmonary vascularity are normal. No adenopathy. No bone lesions. IMPRESSION: No edema or consolidation. Electronically Signed   By: Lowella Grip III M.D.   On: 11/06/2017 11:08   Ct Angio Chest Pe W/cm &/or Wo Cm  Result Date: 11/07/2017 CLINICAL DATA:  Shortness of breath with exertion, chest pain, cough for 3 weeks EXAM: CT ANGIOGRAPHY CHEST WITH CONTRAST TECHNIQUE: Multidetector CT imaging of the chest was performed using the standard protocol during bolus administration of intravenous contrast. Multiplanar CT image reconstructions and MIPs were obtained to evaluate the vascular anatomy. CONTRAST:  166mL ISOVUE-370 IOPAMIDOL (ISOVUE-370) INJECTION 76% COMPARISON:  06/20/2017 FINDINGS: Cardiovascular: Satisfactory opacification of the pulmonary arteries to the segmental level. No evidence of pulmonary embolism. Mild stable cardiomegaly. No pericardial effusion. Mild thoracic aortic atherosclerosis. Mediastinum/Nodes: No enlarged mediastinal, hilar, or axillary lymph nodes. Thyroid gland, trachea, and esophagus demonstrate no significant findings. Lungs/Pleura: Bilateral centrilobular emphysema. No pleural effusion or pneumothorax. No focal consolidation. Focal area of emphysematous changes at  left lung base. Mild right middle lobe scarring. Small biapical bullous disease. Upper Abdomen: No acute upper abdominal abnormality. Musculoskeletal: No acute osseous abnormality. No aggressive osseous lesion. Review of the MIP images confirms the above findings. IMPRESSION: 1. No pulmonary embolus. 2. No thoracic aortic aneurysm or dissection. 3.  Aortic Atherosclerosis (ICD10-I70.0). 4.  Emphysema (ICD10-J43.9). Electronically Signed   By: Kathreen Devoid   On: 11/07/2017 11:00    Procedures Procedures (including critical care time)  Medications Ordered in ED Medications  dextromethorphan-guaiFENesin (MUCINEX DM) 30-600 MG per 12 hr tablet 1 tablet (1 tablet Oral Given 11/07/17 0909)  iopamidol (ISOVUE-370) 76 % injection (has no administration in time range)  sodium chloride 0.9 % injection (has no administration in time range)  ipratropium-albuterol (DUONEB) 0.5-2.5 (3) MG/3ML nebulizer solution 3 mL (3 mLs Nebulization Given 11/07/17 0909)  predniSONE (DELTASONE) tablet 60 mg (60 mg Oral Given 11/07/17 0935)  iopamidol (ISOVUE-370) 76 % injection 100 mL (100 mLs Intravenous Contrast Given 11/07/17 1037)     Initial Impression / Assessment and Plan / ED Course  I have reviewed the triage vital signs and the nursing notes.  Pertinent labs & imaging results that were available during my care of the patient were reviewed by me and considered in my medical decision making (see chart for details).    Patient is a 47 year old woman with medical history significant for COPD, breast cancer status post lumpectomy and chemotherapy, hypertension presenting for evaluation of cough.  She reports of a 3-week history of cough productive of white/yellow sputum also with a 2-day history of shortness of breath, luetic chest pain, dyspnea on exertion as well as body aches and postnasal drip.  In association with this is lacrimation, nasal congestion, rhinorrhea with intermittent yellow discharge as well as  subjective chills.   Reports that her last dose of chemotherapy was a couple months ago and denies calf pain or immobilization.  Physical exams, she appeared comfortable however had maxillary sinus tenderness with no evidence of boggy nasal turbinates.  Differential diagnosis includes rhinosinusitis, pulmonary embolism, bronchitis, pneumonia, pneumothorax, mild COPD exacerbation.  She will receive DuoNeb, prednisone, CT angiography chest for further evaluation.  CT angiography chest shows reveals no signs of pulmonary embolism.    Patient remains hemodynamically stable and her symptoms are most likely secondary to  upper respiratory viral infection or COPD given that she continues to smoke cigarettes, THC and cocaine.  She has been advised on smoking cessation.  Final Clinical Impressions(s) / ED Diagnoses   Final diagnoses:  SOB (shortness of breath)  Rhinosinusitis    ED Discharge Orders    None       Jean Rosenthal, MD 11/07/17 1122    Jean Rosenthal, MD 11/07/17 1124    Lacretia Leigh, MD 11/09/17 1218

## 2017-11-07 NOTE — ED Triage Notes (Signed)
Patient seen yesterday twice in ED and given prescription for robitussin and has taken 1 dose. Patient called out for shob and c/o headache and acheness. Cough for about 2 weeks. Patient states sometimes coughs up yellow sputum.  Hx. Hypertension, COPD, & breast cancer.  Patient took medications this morning.  BP-180/120 95% p-126

## 2017-11-07 NOTE — ED Triage Notes (Signed)
Pt reports having increasing shortness of breath after being seen earlier today. Pt reports no improvement after receiving treatment for similar symptoms. Pt was able to ambulate to restroom without difficulty and is speaking in full sentences.

## 2017-11-28 ENCOUNTER — Other Ambulatory Visit (HOSPITAL_COMMUNITY): Payer: Self-pay

## 2017-11-28 ENCOUNTER — Encounter: Payer: Self-pay | Admitting: Adult Health

## 2017-11-28 ENCOUNTER — Inpatient Hospital Stay: Payer: Self-pay | Attending: Adult Health | Admitting: Adult Health

## 2017-12-02 ENCOUNTER — Encounter (HOSPITAL_COMMUNITY): Payer: Self-pay | Admitting: Emergency Medicine

## 2017-12-02 ENCOUNTER — Inpatient Hospital Stay (HOSPITAL_COMMUNITY)
Admission: EM | Admit: 2017-12-02 | Discharge: 2017-12-04 | DRG: 189 | Disposition: A | Discharge status: Home / Self Care | Payer: Self-pay | Attending: Internal Medicine | Admitting: Internal Medicine

## 2017-12-02 ENCOUNTER — Emergency Department (HOSPITAL_COMMUNITY): Payer: Self-pay

## 2017-12-02 ENCOUNTER — Other Ambulatory Visit: Payer: Self-pay

## 2017-12-02 DIAGNOSIS — F17210 Nicotine dependence, cigarettes, uncomplicated: Secondary | ICD-10-CM | POA: Diagnosis present

## 2017-12-02 DIAGNOSIS — I5042 Chronic combined systolic (congestive) and diastolic (congestive) heart failure: Secondary | ICD-10-CM | POA: Diagnosis present

## 2017-12-02 DIAGNOSIS — E782 Mixed hyperlipidemia: Secondary | ICD-10-CM

## 2017-12-02 DIAGNOSIS — Z9012 Acquired absence of left breast and nipple: Secondary | ICD-10-CM

## 2017-12-02 DIAGNOSIS — J9621 Acute and chronic respiratory failure with hypoxia: Principal | ICD-10-CM | POA: Diagnosis present

## 2017-12-02 DIAGNOSIS — Z79899 Other long term (current) drug therapy: Secondary | ICD-10-CM

## 2017-12-02 DIAGNOSIS — Z8673 Personal history of transient ischemic attack (TIA), and cerebral infarction without residual deficits: Secondary | ICD-10-CM

## 2017-12-02 DIAGNOSIS — R000 Tachycardia, unspecified: Secondary | ICD-10-CM | POA: Diagnosis present

## 2017-12-02 DIAGNOSIS — Z880 Allergy status to penicillin: Secondary | ICD-10-CM

## 2017-12-02 DIAGNOSIS — Z9221 Personal history of antineoplastic chemotherapy: Secondary | ICD-10-CM

## 2017-12-02 DIAGNOSIS — I110 Hypertensive heart disease with heart failure: Secondary | ICD-10-CM | POA: Diagnosis present

## 2017-12-02 DIAGNOSIS — I429 Cardiomyopathy, unspecified: Secondary | ICD-10-CM

## 2017-12-02 DIAGNOSIS — I10 Essential (primary) hypertension: Secondary | ICD-10-CM | POA: Diagnosis present

## 2017-12-02 DIAGNOSIS — J449 Chronic obstructive pulmonary disease, unspecified: Secondary | ICD-10-CM | POA: Diagnosis present

## 2017-12-02 DIAGNOSIS — R946 Abnormal results of thyroid function studies: Secondary | ICD-10-CM | POA: Diagnosis present

## 2017-12-02 DIAGNOSIS — I5043 Acute on chronic combined systolic (congestive) and diastolic (congestive) heart failure: Secondary | ICD-10-CM

## 2017-12-02 DIAGNOSIS — J441 Chronic obstructive pulmonary disease with (acute) exacerbation: Secondary | ICD-10-CM

## 2017-12-02 DIAGNOSIS — Z7982 Long term (current) use of aspirin: Secondary | ICD-10-CM

## 2017-12-02 DIAGNOSIS — Z853 Personal history of malignant neoplasm of breast: Secondary | ICD-10-CM

## 2017-12-02 DIAGNOSIS — F1410 Cocaine abuse, uncomplicated: Secondary | ICD-10-CM | POA: Diagnosis present

## 2017-12-02 DIAGNOSIS — I340 Nonrheumatic mitral (valve) insufficiency: Secondary | ICD-10-CM | POA: Diagnosis present

## 2017-12-02 DIAGNOSIS — R0603 Acute respiratory distress: Secondary | ICD-10-CM

## 2017-12-02 LAB — CBC WITH DIFFERENTIAL/PLATELET
Abs Immature Granulocytes: 0.02 10*3/uL (ref 0.00–0.07)
BASOS PCT: 1 %
Basophils Absolute: 0.1 10*3/uL (ref 0.0–0.1)
EOS ABS: 1 10*3/uL — AB (ref 0.0–0.5)
Eosinophils Relative: 10 %
HCT: 42.4 % (ref 36.0–46.0)
Hemoglobin: 13.2 g/dL (ref 12.0–15.0)
IMMATURE GRANULOCYTES: 0 %
LYMPHS ABS: 4.2 10*3/uL — AB (ref 0.7–4.0)
Lymphocytes Relative: 40 %
MCH: 28.2 pg (ref 26.0–34.0)
MCHC: 31.1 g/dL (ref 30.0–36.0)
MCV: 90.6 fL (ref 80.0–100.0)
MONO ABS: 0.9 10*3/uL (ref 0.1–1.0)
MONOS PCT: 9 %
NRBC: 0 % (ref 0.0–0.2)
Neutro Abs: 4.1 10*3/uL (ref 1.7–7.7)
Neutrophils Relative %: 40 %
PLATELETS: 400 10*3/uL (ref 150–400)
RBC: 4.68 MIL/uL (ref 3.87–5.11)
RDW: 14.4 % (ref 11.5–15.5)
WBC: 10.4 10*3/uL (ref 4.0–10.5)

## 2017-12-02 LAB — COMPREHENSIVE METABOLIC PANEL
ALT: 24 U/L (ref 0–44)
AST: 34 U/L (ref 15–41)
Albumin: 4.2 g/dL (ref 3.5–5.0)
Alkaline Phosphatase: 71 U/L (ref 38–126)
Anion gap: 8 (ref 5–15)
BILIRUBIN TOTAL: 0.5 mg/dL (ref 0.3–1.2)
BUN: 11 mg/dL (ref 6–20)
CHLORIDE: 106 mmol/L (ref 98–111)
CO2: 27 mmol/L (ref 22–32)
Calcium: 9.8 mg/dL (ref 8.9–10.3)
Creatinine, Ser: 1.22 mg/dL — ABNORMAL HIGH (ref 0.44–1.00)
GFR, EST AFRICAN AMERICAN: 59 mL/min — AB (ref >60)
GFR, EST NON AFRICAN AMERICAN: 51 mL/min — AB (ref >60)
Glucose, Bld: 132 mg/dL — ABNORMAL HIGH (ref 70–99)
POTASSIUM: 4.4 mmol/L (ref 3.5–5.1)
Sodium: 141 mmol/L (ref 135–145)
TOTAL PROTEIN: 7.9 g/dL (ref 6.5–8.1)

## 2017-12-02 LAB — MAGNESIUM: MAGNESIUM: 2.1 mg/dL (ref 1.7–2.4)

## 2017-12-02 LAB — I-STAT BETA HCG BLOOD, ED (MC, WL, AP ONLY): HCG, QUANTITATIVE: 6.7 m[IU]/mL — AB (ref <5)

## 2017-12-02 LAB — I-STAT TROPONIN, ED: TROPONIN I, POC: 0.02 ng/mL (ref 0.00–0.08)

## 2017-12-02 MED ORDER — NITROGLYCERIN 2 % TD OINT
1.0000 [in_us] | TOPICAL_OINTMENT | Freq: Once | TRANSDERMAL | Status: AC
  Administered 2017-12-02: 1 [in_us] via TOPICAL
  Filled 2017-12-02: qty 1

## 2017-12-02 MED ORDER — ALBUTEROL (5 MG/ML) CONTINUOUS INHALATION SOLN
15.0000 mg/h | INHALATION_SOLUTION | RESPIRATORY_TRACT | Status: DC
  Administered 2017-12-02: 15 mg/h via RESPIRATORY_TRACT
  Filled 2017-12-02: qty 20

## 2017-12-02 MED ORDER — IPRATROPIUM BROMIDE 0.02 % IN SOLN
0.5000 mg | Freq: Once | RESPIRATORY_TRACT | Status: AC
  Administered 2017-12-02: 0.5 mg via RESPIRATORY_TRACT
  Filled 2017-12-02: qty 2.5

## 2017-12-02 MED ORDER — METHYLPREDNISOLONE SODIUM SUCC 125 MG IJ SOLR
125.0000 mg | Freq: Once | INTRAMUSCULAR | Status: AC
  Administered 2017-12-02: 125 mg via INTRAVENOUS
  Filled 2017-12-02: qty 2

## 2017-12-02 MED ORDER — MAGNESIUM SULFATE 2 GM/50ML IV SOLN
2.0000 g | Freq: Once | INTRAVENOUS | Status: AC
  Administered 2017-12-02: 2 g via INTRAVENOUS
  Filled 2017-12-02: qty 50

## 2017-12-02 NOTE — ED Triage Notes (Signed)
Pt presents with shortness of breath that started last night and has progressed today. Pt sitting in tripod position to aid with breathing.

## 2017-12-02 NOTE — ED Notes (Signed)
Dr. Cardama at bedside.  

## 2017-12-02 NOTE — ED Provider Notes (Signed)
[facility] DEPT Provider Note  CSN: [id number] Arrival date & time: 12/02/17 2248  Chief Complaint(s) Shortness of Breath  HPI Sabrina Holland is a 47 y.o. female with extensive past medical history listed below including hypertension, HIV with an undetectable quant recently, COPD, cardiomyopathy with systolic and diastolic heart failure (EF 25-30% on 06/2017) who presents to the emergency department with gradually worsening shortness of breath over the past couple of days.  Patient endorses associated productive cough and that time.  Denies any fevers or chills.  Denies any associated chest pain, however does endorse chest wall pain which she attributes to coughing, difficulty breathing and tachypnea.  Patient has tried taking inhalers at home with minimal relief.  There is no associated abdominal pain, nausea, vomiting, diarrhea.  No peripheral edema.  The history is provided by the patient.    Past Medical History Past Medical History:  Diagnosis Date  . Breast cancer ([place])   . Breast cancer, stage 2 ([place]) 02/23/2011  . COPD (chronic obstructive pulmonary disease) ([place])   . Hypertension   . TIA (transient ischemic attack) 11/25/2011   "first time" (11/25/2011)   Patient Active Problem List   Diagnosis Date Noted  . Cardiomyopathy ([place]) 07/04/2017  . COPD (chronic obstructive pulmonary disease) ([place])   . Acute on chronic combined systolic and diastolic CHF (congestive heart failure) ([place])   . Anemia 06/20/2017  . Polysubstance abuse ([place]) 11/26/2011  . TIA (transient ischemic attack) 11/25/2011  . HTN (hypertension) 11/25/2011  . Sinusitis 11/25/2011  . Vitamin B 12 deficiency 07/18/2011  . History of breast cancer 02/23/2011   Home Medication(s) Prior to Admission medications   Medication Sig Start Date End Date Taking? Authorizing Provider  albuterol (PROVENTIL HFA) 108 (90 Base) MCG/ACT inhaler Inhale 1-2 puffs into the lungs every 6  (six) hours as needed for wheezing or shortness of breath. 09/04/17   Gildardo Pounds, NP  amLODipine (NORVASC) 5 MG tablet Take 1 tablet (5 mg total) by mouth daily. Patient not taking: Reported on 11/06/2017 09/04/17   Gildardo Pounds, NP  aspirin EC 81 MG EC tablet Take 1 tablet (81 mg total) by mouth daily. 06/24/17   Reyne Dumas, MD  atorvastatin (LIPITOR) 40 MG tablet Take 1 tablet (40 mg total) by mouth daily at 6 PM. 09/04/17   Gildardo Pounds, NP  famotidine (PEPCID) 20 MG tablet Take 1 tablet (20 mg total) by mouth 2 (two) times daily. 11/06/17   Virgel Manifold, MD  furosemide (LASIX) 20 MG tablet Take 1 tablet (20 mg total) by mouth daily. 09/04/17   Gildardo Pounds, NP  guaiFENesin-codeine (ROBITUSSIN AC) 100-10 MG/5ML syrup Take 5 mLs by mouth 3 (three) times daily as needed for cough. 11/06/17   Virgel Manifold, MD  lisinopril (PRINIVIL,ZESTRIL) 10 MG tablet Take 1 tablet (10 mg total) by mouth daily. Patient not taking: Reported on 11/06/2017 07/04/17   Erlene Quan, PA-C  Multiple Vitamin (MULTIVITAMIN WITH MINERALS) TABS tablet Take 1 tablet by mouth daily.    [provider]  nicotine (NICODERM CQ - DOSED IN MG/24 HOURS) 14 mg/24hr patch Place 1 patch (14 mg total) onto the skin daily. 06/24/17   Reyne Dumas, MD  spironolactone (ALDACTONE) 25 MG tablet Take 1 tablet (25 mg total) by mouth daily. 09/04/17   Gildardo Pounds, NP  Past Surgical History Past Surgical History:  Procedure Laterality Date  . BREAST BIOPSY  2007   left  . MASTECTOMY  09/2005   left  . TONSILLECTOMY AND ADENOIDECTOMY     "when I was little" (11/25/2011)  . TUBAL LIGATION  1993   Family History Family History  Problem Relation Age of Onset  . COPD Father   . Sickle cell anemia Cousin        maternal cousin    Social History Social History   Tobacco Use  .  Smoking status: Current Some Day Smoker    Packs/day: 0.00    Years: 20.00    Pack years: 0.00    Types: Cigarettes  . Smokeless tobacco: Never Used  . Tobacco comment: 1 or 2 cigarettes a day- approx 1 pack week  Substance Use Topics  . Alcohol use: Yes    Alcohol/week: 12.0 standard drinks    Types: 12 Cans of beer per week    Comment: occasionally  . Drug use: Yes    Types: Marijuana, Cocaine    Comment: patient states she uses cocaine and marijuana daily   Allergies Penicillins  Review of Systems Review of Systems All other systems are reviewed and are negative for acute change except as noted in the HPI  Physical Exam Vital Signs  I have reviewed the triage vital signs BP (!) 167/123 (BP Location: Right Arm)   Pulse (!) 136   Resp (!) 40   Ht 5\' 5"  (1.651 m)   Wt 53.5 kg   LMP 02/18/2015 (Exact Date) Comment: MAYBE ONCE A YEAR  SpO2 95%   BMI 19.63 kg/m   Physical Exam  Constitutional: She is oriented to person, place, and time. She appears well-developed and well-nourished. She has a sickly appearance. No distress. Face mask in place.  HENT:  Head: Normocephalic and atraumatic.  Nose: Nose normal.  Eyes: Pupils are equal, round, and reactive to light. Conjunctivae and EOM are normal. Right eye exhibits no discharge. Left eye exhibits no discharge. No scleral icterus.  Neck: Normal range of motion. Neck supple.  Cardiovascular: Regular rhythm. Tachycardia present. Exam reveals no gallop and no friction rub.  No murmur heard. Pulmonary/Chest: Accessory muscle usage present. No stridor. Tachypnea noted. She is in respiratory distress. She has no wheezes. She has no rhonchi. She has no rales.  Decreased air movement throughout  Abdominal: Soft. She exhibits no distension. There is no tenderness.  Musculoskeletal: She exhibits no edema or tenderness.  Neurological: She is alert and oriented to person, place, and time.  Skin: Skin is warm and dry. No rash noted. She  is not diaphoretic. No erythema.  Psychiatric: She has a normal mood and affect.  Vitals reviewed.   ED Results and Treatments Labs (all labs ordered are listed, but only abnormal results are displayed) Labs Reviewed  BRAIN NATRIURETIC PEPTIDE - Abnormal; Notable for the following components:      Result Value   B Natriuretic Peptide 1,499.0 (*)    All other components within normal limits  COMPREHENSIVE METABOLIC PANEL - Abnormal; Notable for the following components:   Glucose, Bld 132 (*)    Creatinine, Ser 1.22 (*)    GFR calc non Af Amer 51 (*)    GFR calc Af Amer 59 (*)    All other components within normal limits  CBC WITH DIFFERENTIAL/PLATELET - Abnormal; Notable for the following components:   Lymphs Abs 4.2 (*)    Eosinophils Absolute 1.0 (*)  All other components within normal limits  BLOOD GAS, VENOUS - Abnormal; Notable for the following components:   pO2, Ven 59.2 (*)    All other components within normal limits  I-STAT BETA HCG BLOOD, ED ([facility], [facility], [facility] ONLY) - Abnormal; Notable for the following components:   I-stat hCG, quantitative 6.7 (*)    All other components within normal limits  MAGNESIUM  BLOOD GAS, ARTERIAL  I-STAT TROPONIN, ED                                                                                                                         EKG  EKG Interpretation  Date/Time:  Saturday December 02 2017 23:10:32 EST Ventricular Rate:  116 PR Interval:    QRS Duration: 80 QT Interval:  346 QTC Calculation: 481 R Axis:   87 Text Interpretation:  Sinus tachycardia Biatrial enlargement Left ventricular hypertrophy Baseline wander in lead(s) V1 V2 V3 V4 V5 V6 NO STEMI Confirmed by Addison Lank [phone]) on 12/03/2017 12:26:29 AM      Radiology Dg Chest Portable 1 View  Result Date: 12/02/2017 CLINICAL DATA:  Shortness of breath, history of COPD EXAM: PORTABLE CHEST 1 VIEW COMPARISON:  CT chest 11/07/2017 FINDINGS: The lungs are hyperinflated  likely secondary to COPD. There is no focal parenchymal opacity. There is no pleural effusion or pneumothorax. The cardiomediastinal silhouette is stable. The osseous structures are unremarkable. IMPRESSION: No active disease. Electronically Signed   By: Kathreen Devoid   On: 12/02/2017 23:27   Pertinent labs & imaging results that were available during my care of the patient were reviewed by me and considered in my medical decision making (see chart for details).  Medications Ordered in ED Medications  albuterol (PROVENTIL,VENTOLIN) solution continuous neb (15 mg/hr Nebulization New Bag/Given 12/02/17 2316)  azithromycin (ZITHROMAX) 500 mg in sodium chloride 0.9 % 250 mL IVPB (has no administration in time range)  ipratropium (ATROVENT) nebulizer solution 0.5 mg (0.5 mg Nebulization Given 12/02/17 2316)  magnesium sulfate IVPB 2 g 50 mL (2 g Intravenous New Bag/Given 12/02/17 2336)  methylPREDNISolone sodium succinate (SOLU-MEDROL) 125 mg/2 mL injection 125 mg (125 mg Intravenous Given 12/02/17 2335)  nitroGLYCERIN (NITROGLYN) 2 % ointment 1 inch (1 inch Topical Given 12/02/17 2331)  Procedures .Critical Care Performed by: Fatima Blank, MD Authorized by: Fatima Blank, MD   Critical care provider statement:    Critical care time (minutes):  45   Critical care start time:  12/02/2017 11:00 PM   Critical care end time:  12/03/2017 12:40 AM   Critical care was necessary to treat or prevent imminent or life-threatening deterioration of the following conditions:  Respiratory failure   Critical care was time spent personally by me on the following activities:  Discussions with consultants, evaluation of patient's response to treatment, examination of patient, ordering and performing treatments and interventions, ordering and review of laboratory studies,  ordering and review of radiographic studies, pulse oximetry, re-evaluation of patient's condition, obtaining history from patient or surrogate and review of old charts    (including critical care time)  Medical Decision Making / ED Course I have reviewed the nursing notes for this encounter and the patient's prior records (if available in EHR or on provided paperwork).    Respiratory distress and patient with a history of COPD and cardiomyopathy.  Patient satting well on 15 L nonrebreather but will transition to BiPAP to treat COPD exacerbation.  Solu-Medrol and magnesium given.  Placed on continuous DuoNeb.  There is no evidence of volume overload on exam suspicious for CHF exacerbation however will obtain cardiac labs.  BNP was significantly elevated above her baseline, concerning for exacerbation.  Chest x-ray without evidence of pulmonary edema or pneumonia.  No pneumothorax noted.  Given azithromycin to cover for atypical bacteria given the increased coughing and sputum.  On reevaluation, patient with significantly improved work of breathing.  She also had improved air movement.  Rest of the labs without leukocytosis or anemia.  No significant electrolyte derangements. Mild  renal insufficiency.  Unable to obtain an ABG but VBG without evidence of respiratory acidosis.  Will discuss case with medicine for admission and continued management.  Final Clinical Impression(s) / ED Diagnoses Final diagnoses:  Respiratory distress  COPD exacerbation ([place])  Acute on chronic combined systolic and diastolic congestive heart failure ([place])      This chart was dictated using voice recognition software.  Despite best efforts to proofread,  errors can occur which can change the documentation meaning.   Fatima Blank, MD 12/03/17 [id number]

## 2017-12-03 ENCOUNTER — Inpatient Hospital Stay (HOSPITAL_COMMUNITY): Payer: Self-pay

## 2017-12-03 DIAGNOSIS — I429 Cardiomyopathy, unspecified: Secondary | ICD-10-CM

## 2017-12-03 DIAGNOSIS — I340 Nonrheumatic mitral (valve) insufficiency: Secondary | ICD-10-CM

## 2017-12-03 DIAGNOSIS — J441 Chronic obstructive pulmonary disease with (acute) exacerbation: Secondary | ICD-10-CM

## 2017-12-03 DIAGNOSIS — J9621 Acute and chronic respiratory failure with hypoxia: Secondary | ICD-10-CM | POA: Diagnosis present

## 2017-12-03 DIAGNOSIS — I10 Essential (primary) hypertension: Secondary | ICD-10-CM

## 2017-12-03 LAB — RAPID URINE DRUG SCREEN, HOSP PERFORMED
Amphetamines: NOT DETECTED
Barbiturates: NOT DETECTED
Benzodiazepines: NOT DETECTED
COCAINE: POSITIVE — AB
Opiates: NOT DETECTED
Tetrahydrocannabinol: POSITIVE — AB

## 2017-12-03 LAB — COMPREHENSIVE METABOLIC PANEL
ALBUMIN: 3.9 g/dL (ref 3.5–5.0)
ALT: 23 U/L (ref 0–44)
AST: 33 U/L (ref 15–41)
Alkaline Phosphatase: 65 U/L (ref 38–126)
Anion gap: 13 (ref 5–15)
BUN: 14 mg/dL (ref 6–20)
CO2: 21 mmol/L — AB (ref 22–32)
Calcium: 9.3 mg/dL (ref 8.9–10.3)
Chloride: 107 mmol/L (ref 98–111)
Creatinine, Ser: 1.05 mg/dL — ABNORMAL HIGH (ref 0.44–1.00)
GFR calc Af Amer: >60 mL/min (ref >60)
GFR calc non Af Amer: >60 mL/min (ref >60)
Glucose, Bld: 146 mg/dL — ABNORMAL HIGH (ref 70–99)
POTASSIUM: 3.9 mmol/L (ref 3.5–5.1)
Sodium: 141 mmol/L (ref 135–145)
Total Bilirubin: 0.6 mg/dL (ref 0.3–1.2)
Total Protein: 7.2 g/dL (ref 6.5–8.1)

## 2017-12-03 LAB — URINALYSIS, ROUTINE W REFLEX MICROSCOPIC
BILIRUBIN URINE: NEGATIVE
Glucose, UA: 50 mg/dL — AB
HGB URINE DIPSTICK: NEGATIVE
Ketones, ur: 5 mg/dL — AB
Nitrite: NEGATIVE
PH: 5 (ref 5.0–8.0)
Protein, ur: NEGATIVE mg/dL
SPECIFIC GRAVITY, URINE: 1.027 (ref 1.005–1.030)

## 2017-12-03 LAB — CBC
HCT: 39.4 % (ref 36.0–46.0)
HEMOGLOBIN: 11.9 g/dL — AB (ref 12.0–15.0)
MCH: 27.7 pg (ref 26.0–34.0)
MCHC: 30.2 g/dL (ref 30.0–36.0)
MCV: 91.6 fL (ref 80.0–100.0)
NRBC: 0 % (ref 0.0–0.2)
Platelets: 350 10*3/uL (ref 150–400)
RBC: 4.3 MIL/uL (ref 3.87–5.11)
RDW: 14.5 % (ref 11.5–15.5)
WBC: 6.6 10*3/uL (ref 4.0–10.5)

## 2017-12-03 LAB — ECHOCARDIOGRAM COMPLETE
HEIGHTINCHES: 65 in
WEIGHTICAEL: 1869.5 [oz_av]

## 2017-12-03 LAB — MRSA PCR SCREENING: MRSA BY PCR: NEGATIVE

## 2017-12-03 LAB — TSH: TSH: <0.01 u[IU]/mL — ABNORMAL LOW (ref 0.350–4.500)

## 2017-12-03 LAB — BRAIN NATRIURETIC PEPTIDE: B Natriuretic Peptide: 1499 pg/mL — ABNORMAL HIGH (ref 0.0–100.0)

## 2017-12-03 MED ORDER — SODIUM CHLORIDE 0.9 % IV SOLN
500.0000 mg | Freq: Once | INTRAVENOUS | Status: AC
  Administered 2017-12-03: 500 mg via INTRAVENOUS
  Filled 2017-12-03: qty 500

## 2017-12-03 MED ORDER — PANTOPRAZOLE SODIUM 40 MG PO TBEC
40.0000 mg | DELAYED_RELEASE_TABLET | Freq: Every day | ORAL | Status: DC
  Administered 2017-12-03: 40 mg via ORAL
  Filled 2017-12-03 (×2): qty 1

## 2017-12-03 MED ORDER — LISINOPRIL 10 MG PO TABS
20.0000 mg | ORAL_TABLET | Freq: Every day | ORAL | Status: DC
  Administered 2017-12-03 – 2017-12-04 (×2): 20 mg via ORAL
  Filled 2017-12-03 (×2): qty 2

## 2017-12-03 MED ORDER — LORATADINE 10 MG PO TABS
10.0000 mg | ORAL_TABLET | Freq: Every day | ORAL | Status: DC
  Administered 2017-12-03 – 2017-12-04 (×2): 10 mg via ORAL
  Filled 2017-12-03 (×2): qty 1

## 2017-12-03 MED ORDER — IPRATROPIUM BROMIDE 0.02 % IN SOLN
0.5000 mg | Freq: Four times a day (QID) | RESPIRATORY_TRACT | Status: DC
  Administered 2017-12-03 (×3): 0.5 mg via RESPIRATORY_TRACT
  Filled 2017-12-03 (×3): qty 2.5

## 2017-12-03 MED ORDER — FLUTICASONE PROPIONATE 50 MCG/ACT NA SUSP
2.0000 | Freq: Every day | NASAL | Status: DC
  Administered 2017-12-03 – 2017-12-04 (×2): 2 via NASAL
  Filled 2017-12-03: qty 16

## 2017-12-03 MED ORDER — FUROSEMIDE 20 MG PO TABS
20.0000 mg | ORAL_TABLET | Freq: Every day | ORAL | Status: DC
  Administered 2017-12-03 – 2017-12-04 (×2): 20 mg via ORAL
  Filled 2017-12-03 (×2): qty 1

## 2017-12-03 MED ORDER — LEVALBUTEROL HCL 0.63 MG/3ML IN NEBU: 0.6300 mg | INHALATION_SOLUTION | RESPIRATORY_TRACT | Status: DC | PRN

## 2017-12-03 MED ORDER — HEPARIN SODIUM (PORCINE) 5000 UNIT/ML IJ SOLN
5000.0000 [IU] | Freq: Three times a day (TID) | INTRAMUSCULAR | Status: DC
  Administered 2017-12-03 – 2017-12-04 (×4): 5000 [IU] via SUBCUTANEOUS
  Filled 2017-12-03 (×5): qty 1

## 2017-12-03 MED ORDER — BUDESONIDE 0.5 MG/2ML IN SUSP
0.5000 mg | Freq: Two times a day (BID) | RESPIRATORY_TRACT | Status: DC
  Administered 2017-12-03 – 2017-12-04 (×3): 0.5 mg via RESPIRATORY_TRACT
  Filled 2017-12-03 (×3): qty 2

## 2017-12-03 MED ORDER — IPRATROPIUM-ALBUTEROL 0.5-2.5 (3) MG/3ML IN SOLN: 3.0000 mL | Freq: Four times a day (QID) | RESPIRATORY_TRACT | Status: DC

## 2017-12-03 MED ORDER — ACETAMINOPHEN 325 MG PO TABS
650.0000 mg | ORAL_TABLET | ORAL | Status: DC | PRN
  Administered 2017-12-03 (×2): 650 mg via ORAL
  Filled 2017-12-03 (×2): qty 2

## 2017-12-03 MED ORDER — SODIUM CHLORIDE 0.9% FLUSH: 3.0000 mL | INTRAVENOUS | Status: DC | PRN

## 2017-12-03 MED ORDER — ONDANSETRON HCL 4 MG/2ML IJ SOLN: 4.0000 mg | Freq: Four times a day (QID) | INTRAMUSCULAR | Status: DC | PRN

## 2017-12-03 MED ORDER — METHYLPREDNISOLONE SODIUM SUCC 125 MG IJ SOLR
80.0000 mg | Freq: Four times a day (QID) | INTRAMUSCULAR | Status: DC
  Administered 2017-12-03 (×4): 80 mg via INTRAVENOUS
  Filled 2017-12-03 (×5): qty 2

## 2017-12-03 MED ORDER — LEVALBUTEROL HCL 0.63 MG/3ML IN NEBU
0.6300 mg | INHALATION_SOLUTION | Freq: Four times a day (QID) | RESPIRATORY_TRACT | Status: DC
  Administered 2017-12-03 (×3): 0.63 mg via RESPIRATORY_TRACT
  Filled 2017-12-03 (×3): qty 3

## 2017-12-03 MED ORDER — SODIUM CHLORIDE 0.9% FLUSH
3.0000 mL | Freq: Two times a day (BID) | INTRAVENOUS | Status: DC
  Administered 2017-12-03 – 2017-12-04 (×3): 3 mL via INTRAVENOUS

## 2017-12-03 MED ORDER — ONDANSETRON HCL 4 MG PO TABS: 4.0000 mg | ORAL_TABLET | Freq: Four times a day (QID) | ORAL | Status: DC | PRN

## 2017-12-03 MED ORDER — IPRATROPIUM BROMIDE 0.02 % IN SOLN
0.5000 mg | Freq: Three times a day (TID) | RESPIRATORY_TRACT | Status: DC
  Administered 2017-12-04: 0.5 mg via RESPIRATORY_TRACT
  Filled 2017-12-03: qty 2.5

## 2017-12-03 MED ORDER — LEVALBUTEROL HCL 0.63 MG/3ML IN NEBU
0.6300 mg | INHALATION_SOLUTION | Freq: Three times a day (TID) | RESPIRATORY_TRACT | Status: DC
  Administered 2017-12-04: 0.63 mg via RESPIRATORY_TRACT
  Filled 2017-12-03: qty 3

## 2017-12-03 MED ORDER — IPRATROPIUM BROMIDE 0.02 % IN SOLN: 0.5000 mg | RESPIRATORY_TRACT | Status: DC | PRN

## 2017-12-03 MED ORDER — SODIUM CHLORIDE 0.9 % IV SOLN
250.0000 mL | INTRAVENOUS | Status: DC | PRN
  Administered 2017-12-03: 250 mL via INTRAVENOUS

## 2017-12-03 MED ORDER — BENZONATATE 100 MG PO CAPS: 100.0000 mg | ORAL_CAPSULE | Freq: Three times a day (TID) | ORAL | Status: DC | PRN

## 2017-12-03 MED ORDER — TRAMADOL HCL 50 MG PO TABS
100.0000 mg | ORAL_TABLET | Freq: Four times a day (QID) | ORAL | Status: DC | PRN
  Administered 2017-12-03 (×2): 100 mg via ORAL
  Filled 2017-12-03 (×2): qty 2

## 2017-12-03 MED ORDER — ORAL CARE MOUTH RINSE
15.0000 mL | Freq: Two times a day (BID) | OROMUCOSAL | Status: DC
  Administered 2017-12-03 (×2): 15 mL via OROMUCOSAL

## 2017-12-03 MED ORDER — LEVOFLOXACIN IN D5W 750 MG/150ML IV SOLN
750.0000 mg | Freq: Every day | INTRAVENOUS | Status: DC
  Administered 2017-12-03: 750 mg via INTRAVENOUS
  Filled 2017-12-03 (×2): qty 150

## 2017-12-03 MED ORDER — ASPIRIN EC 81 MG PO TBEC
81.0000 mg | DELAYED_RELEASE_TABLET | Freq: Every day | ORAL | Status: DC
  Administered 2017-12-03 – 2017-12-04 (×2): 81 mg via ORAL
  Filled 2017-12-03 (×2): qty 1

## 2017-12-03 MED ORDER — ATORVASTATIN CALCIUM 40 MG PO TABS
40.0000 mg | ORAL_TABLET | Freq: Every day | ORAL | Status: DC
  Administered 2017-12-03 – 2017-12-04 (×2): 40 mg via ORAL
  Filled 2017-12-03 (×3): qty 1

## 2017-12-03 MED ORDER — NICOTINE 14 MG/24HR TD PT24
14.0000 mg | MEDICATED_PATCH | Freq: Every day | TRANSDERMAL | Status: DC
  Filled 2017-12-03: qty 1

## 2017-12-03 MED ORDER — SPIRONOLACTONE 25 MG PO TABS
25.0000 mg | ORAL_TABLET | Freq: Every day | ORAL | Status: DC
  Administered 2017-12-03 – 2017-12-04 (×2): 25 mg via ORAL
  Filled 2017-12-03 (×2): qty 1

## 2017-12-03 NOTE — H&P (Signed)
History and Physical   Sabrina Holland WUJ:[id number] DOB: 04-22-1970 DOA: 12/02/2017  Referring MD/NP/PA: Dr. Leonette Monarch  PCP: Gildardo Pounds, NP   Patient coming from: Home  Chief Complaint: Shortness of breath  HPI: Sabrina Holland is a 47 y.o. female with medical history significant of advanced systolic dysfunction CHF with a EF of 25 to 30% from previous echocardiogram in June of this year, history of COPD, hypertension, breast cancer and TIA who came to the ER with significant shortness of breath cough and wheezing.  Patient was immediately placed on BiPAP secondary to tachypnea with hypoxemia on ABG.  She was struggling to breathe but finally able to come down on the BiPAP.  She denied any change in her medications.  No change in her current situation.  Chest x-ray showed no obvious fluid overload.  Patient was having significant expiratory wheezing.  She has received Solu-Medrol with nebulizer in the ER and has improved.  She is being admitted with acute on chronic respiratory failure most likely COPD exacerbation at this point.  ED Course: Temperature is 98 for blood pressure 167/123 pulse 182 respirate of 48 oxygen sats 95% on BiPAP.  CBC appears normal chemistry appears within range except for creatinine 1.22 and glucose of 132.  Chest x-ray showed no acute findings.  proBNP was 1499.  He was only 456 on October 21.  Review of Systems: As per HPI otherwise 10 point review of systems negative.    Past Medical History:  Diagnosis Date  . Breast cancer ([place])   . Breast cancer, stage 2 ([place]) 02/23/2011  . COPD (chronic obstructive pulmonary disease) ([place])   . Hypertension   . TIA (transient ischemic attack) 11/25/2011   "first time" (11/25/2011)    Past Surgical History:  Procedure Laterality Date  . BREAST BIOPSY  2007   left  . MASTECTOMY  09/2005   left  . TONSILLECTOMY AND ADENOIDECTOMY     "when I was little" (11/25/2011)  . TUBAL LIGATION  1993     reports that she has been smoking cigarettes. She has been smoking about 0.00 packs per day for the past 20.00 years. She has never used smokeless tobacco. She reports that she drinks about 12.0 standard drinks of alcohol per week. She reports that she has current or past drug history. Drugs: Marijuana and Cocaine.  Allergies  Allergen Reactions  . Penicillins Other (See Comments)    Pt had slight throat swelling Has patient had a PCN reaction causing immediate rash, facial/tongue/throat swelling, SOB or lightheadedness with hypotension: No Has patient had a PCN reaction causing severe rash involving mucus membranes or skin necrosis: No Has patient had a PCN reaction that required hospitalization No Has patient had a PCN reaction occurring within the last 10 years: No If all of the above answers are "NO", then may proceed with Cephalosporin use.     Family History  Problem Relation Age of Onset  . COPD Father   . Sickle cell anemia Cousin        maternal cousin     Prior to Admission medications   Medication Sig Start Date End Date Taking? Authorizing Provider  albuterol (PROVENTIL HFA) 108 (90 Base) MCG/ACT inhaler Inhale 1-2 puffs into the lungs every 6 (six) hours as needed for wheezing or shortness of breath. 09/04/17   Gildardo Pounds, NP  amLODipine (NORVASC) 5 MG tablet Take 1 tablet (5 mg total) by mouth daily. Patient not taking: Reported on 11/06/2017 09/04/17  Gildardo Pounds, NP  aspirin EC 81 MG EC tablet Take 1 tablet (81 mg total) by mouth daily. 06/24/17   Reyne Dumas, MD  atorvastatin (LIPITOR) 40 MG tablet Take 1 tablet (40 mg total) by mouth daily at 6 PM. 09/04/17   Gildardo Pounds, NP  famotidine (PEPCID) 20 MG tablet Take 1 tablet (20 mg total) by mouth 2 (two) times daily. 11/06/17   Virgel Manifold, MD  furosemide (LASIX) 20 MG tablet Take 1 tablet (20 mg total) by mouth daily. 09/04/17   Gildardo Pounds, NP  guaiFENesin-codeine (ROBITUSSIN AC) 100-10 MG/5ML  syrup Take 5 mLs by mouth 3 (three) times daily as needed for cough. 11/06/17   Virgel Manifold, MD  lisinopril (PRINIVIL,ZESTRIL) 10 MG tablet Take 1 tablet (10 mg total) by mouth daily. Patient not taking: Reported on 11/06/2017 07/04/17   Erlene Quan, PA-C  Multiple Vitamin (MULTIVITAMIN WITH MINERALS) TABS tablet Take 1 tablet by mouth daily.    [provider]  nicotine (NICODERM CQ - DOSED IN MG/24 HOURS) 14 mg/24hr patch Place 1 patch (14 mg total) onto the skin daily. 06/24/17   Reyne Dumas, MD  spironolactone (ALDACTONE) 25 MG tablet Take 1 tablet (25 mg total) by mouth daily. 09/04/17   Gildardo Pounds, NP    Physical Exam: Vitals:   12/02/17 2305 12/02/17 2308 12/02/17 2308 12/02/17 2316  BP: (!) 167/123 (!) 148/92 (!) 148/92   Pulse: (!) 138 (!) 122    Resp: (!) 44 (!) 42    TempSrc:      SpO2: 95%   100%  Weight:      Height:          Constitutional: NAD, calm, comfortable Vitals:   12/02/17 2305 12/02/17 2308 12/02/17 2308 12/02/17 2316  BP: (!) 167/123 (!) 148/92 (!) 148/92   Pulse: (!) 138 (!) 122    Resp: (!) 44 (!) 42    TempSrc:      SpO2: 95%   100%  Weight:      Height:       Eyes: PERRL, lids and conjunctivae normal ENMT: Mucous membranes are moist. Posterior pharynx clear of any exudate or lesions.Normal dentition.  Neck: normal, supple, no masses, no thyromegaly Respiratory: Marked decrease in air entry bilaterally with expiratory wheezing and rhonchi.  Patient has use of accessory muscle.  Cardiovascular: Regular rate and rhythm, no murmurs / rubs / gallops. No extremity edema. 2+ pedal pulses. No carotid bruits.  Abdomen: no tenderness, no masses palpated. No hepatosplenomegaly. Bowel sounds positive.  Musculoskeletal: no clubbing / cyanosis. No joint deformity upper and lower extremities. Good ROM, no contractures. Normal muscle tone.  Skin: no rashes, lesions, ulcers. No induration Neurologic: CN 2-12 grossly intact. Sensation intact, DTR  normal. Strength 5/5 in all 4.  Psychiatric: Normal judgment and insight. Alert and oriented x 3. Normal mood.     Labs on Admission: I have personally reviewed following labs and imaging studies  CBC: Recent Labs  Lab 12/02/17 2311  WBC 10.4  NEUTROABS 4.1  HGB 13.2  HCT 42.4  MCV 90.6  PLT 416   Basic Metabolic Panel: Recent Labs  Lab 12/02/17 2311  NA 141  K 4.4  CL 106  CO2 27  GLUCOSE 132*  BUN 11  CREATININE 1.22*  CALCIUM 9.8  MG 2.1   GFR: Estimated Creatinine Clearance: 47.1 mL/min (A) (by C-G formula based on SCr of 1.22 mg/dL (H)). Liver Function Tests: Recent Labs  Lab  12/02/17 2311  AST 34  ALT 24  ALKPHOS 71  BILITOT 0.5  PROT 7.9  ALBUMIN 4.2   No results for input(s): LIPASE, AMYLASE in the last 168 hours. No results for input(s): AMMONIA in the last 168 hours. Coagulation Profile: No results for input(s): INR, PROTIME in the last 168 hours. Cardiac Enzymes: No results for input(s): CKTOTAL, CKMB, CKMBINDEX, TROPONINI in the last 168 hours. BNP (last 3 results) No results for input(s): PROBNP in the last 8760 hours. HbA1C: No results for input(s): HGBA1C in the last 72 hours. CBG: No results for input(s): GLUCAP in the last 168 hours. Lipid Profile: No results for input(s): CHOL, HDL, LDLCALC, TRIG, CHOLHDL, LDLDIRECT in the last 72 hours. Thyroid Function Tests: No results for input(s): TSH, T4TOTAL, FREET4, T3FREE, THYROIDAB in the last 72 hours. Anemia Panel: No results for input(s): VITAMINB12, FOLATE, FERRITIN, TIBC, IRON, RETICCTPCT in the last 72 hours. Urine analysis:    Component Value Date/Time   COLORURINE YELLOW 06/20/2017 1841   APPEARANCEUR CLEAR 06/20/2017 1841   LABSPEC >1.046 (H) 06/20/2017 1841   LABSPEC 1.015 11/29/2007 1119   PHURINE 5.0 06/20/2017 1841   GLUCOSEU NEGATIVE 06/20/2017 1841   HGBUR NEGATIVE 06/20/2017 1841   BILIRUBINUR negative 09/04/2017 1217   BILIRUBINUR Negative 11/29/2007 1119    KETONESUR negative 09/04/2017 1217   KETONESUR NEGATIVE 06/20/2017 1841   PROTEINUR 30 (A) 06/20/2017 1841   UROBILINOGEN 0.2 09/04/2017 1217   UROBILINOGEN 0.2 03/02/2009 0856   NITRITE Negative 09/04/2017 1217   NITRITE NEGATIVE 06/20/2017 1841   LEUKOCYTESUR Large (3+) (A) 09/04/2017 1217   LEUKOCYTESUR Negative 11/29/2007 1119   Sepsis Labs: @LABRCNTIP (procalcitonin:4,lacticidven:4) )No results found for this or any previous visit (from the past 240 hour(s)).   Radiological Exams on Admission: Dg Chest Portable 1 View  Result Date: 12/02/2017 CLINICAL DATA:  Shortness of breath, history of COPD EXAM: PORTABLE CHEST 1 VIEW COMPARISON:  CT chest 11/07/2017 FINDINGS: The lungs are hyperinflated likely secondary to COPD. There is no focal parenchymal opacity. There is no pleural effusion or pneumothorax. The cardiomediastinal silhouette is stable. The osseous structures are unremarkable. IMPRESSION: No active disease. Electronically Signed   By: Kathreen Devoid   On: 12/02/2017 23:27      Assessment/Plan Principal Problem:   Acute on chronic respiratory failure with hypoxemia (HCC) Active Problems:   History of breast cancer   HTN (hypertension)   COPD (chronic obstructive pulmonary disease) ([place])   Cardiomyopathy ([place])     #1 acute on chronic respiratory failure: Most likely secondary to COPD exacerbation.  Patient will be treated with IV steroids with nebulizer and empiric antibiotics.  Oxygen will continue if she is able to titrate off BiPAP.  I will admit her to stepdown for now.  #2 cardiomyopathy: EF of 25 to 30% previously.  No evidence of fluid overload at this point.  We will work towards keeping patient euvolemic.  Home regimen of Lasix will be confirmed and continued with.  #3 hypertension: Continue with home regimen.  Blood pressure is elevated at the moment partly due to patient's respiratory distress.  #4 history of breast cancer: Patient has had mastectomy.   Continue her outpatient follow-ups.   DVT prophylaxis: Heparin Code Status: Full code Family Communication: No family at bedside Disposition Plan:  to be determined Consults called: None Admission status: Inpatient  Severity of Illness: The appropriate patient status for this patient is INPATIENT. Inpatient status is judged to be reasonable and necessary in order to provide  the required intensity of service to ensure the patient's safety. The patient's presenting symptoms, physical exam findings, and initial radiographic and laboratory data in the context of their chronic comorbidities is felt to place them at high risk for further clinical deterioration. Furthermore, it is not anticipated that the patient will be medically stable for discharge from the hospital within 2 midnights of admission. The following factors support the patient status of inpatient.   " The patient's presenting symptoms include respiratory distress. " The worrisome physical exam findings include marked expiratory wheezing. " The initial radiographic and laboratory data are worrisome because of no obvious pulmonary edema. " The chronic co-morbidities include COPD and CHF.   * I certify that at the point of admission it is my clinical judgment that the patient will require inpatient hospital care spanning beyond 2 midnights from the point of admission due to high intensity of service, high risk for further deterioration and high frequency of surveillance required.Barbette Merino MD [facility] Pager [phone]  If 7PM-7AM, please contact night-coverage [email or web address] Password [email or web address]  12/03/2017, 1:14 AM

## 2017-12-03 NOTE — Progress Notes (Signed)
PROGRESS NOTE    Sabrina Holland  MVH:[id number] DOB: [birth year] DOA: 12/02/2017 PCP: Gildardo Pounds, NP    Brief Narrative: Patient is a 47 year old female history of advanced systolic heart failure EF of 20 to 25% 2D echo June 2019, history of COPD, hypertension, cocaine abuse, history of breast cancer, TIA presented to the ED with worsening shortness of breath and wheezing.  Patient placed on the BiPAP immediately in the ED due to worsening respiratory distress.  Chest x-ray was negative for any infiltrate or volume overload.  Patient noted to have active Tory wheezing.  Patient admitted to the stepdown unit for an acute COPD exacerbation and placed on IV steroids, BiPAP, IV Levaquin, nebulizer treatment.  Patient noted to have a significant tachycardia with elevated blood pressure of 167/123.   Assessment & Plan:   Principal Problem:   Acute on chronic respiratory failure with hypoxemia (HCC) Active Problems:   History of breast cancer   HTN (hypertension)   COPD (chronic obstructive pulmonary disease) ([place])   Cardiomyopathy (HCC)  #1 acute on chronic respiratory failure with hypoxemia secondary to an acute COPD exacerbation.   Patient presented with acute on chronic respiratory distress secondary to his COPD exacerbation requiring BiPAP.  Chest x-ray negative for any acute infiltrate or volume overload.  Patient with some clinical improvement however not at baseline.  Currently off BiPAP and on nasal cannula.  Check a UDS.  Continue IV Solu-Medrol 80 mg IV every 6 hours.  Change duo nebs due to tachycardia and place patient on scheduled Xopenex and Atrovent nebs.  Pulmicort, Claritin, Flonase, Protonix.  Tessalon Perles as needed.  BiPAP as needed.  Follow.  2.  Hypertension Resume home regimen of lisinopril 20 mg daily, home dose Lasix 20 mg daily, spironolactone 25 mg daily.  Follow.  3.  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  Outpatient follow-up.  4.  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  2D echo 06/21/2017.  Patient is being followed by cardiology who had recommended repeat 2D echo in September 2019 and if patient's EF remains depressed may need ischemic evaluation.  Patient states supposed to have a 2D echo this coming week.  Will order 2D echo during this hospitalization.  Resume home regimen of Lasix, lisinopril, spironolactone.  Due to patient's ongoing cocaine use patient is not a candidate at this time for Entresto oral beta-blocker and may not be a ICD either per cardiology notes.  Will need outpatient follow-up with cardiology.  5.  Polysubstance use Patient does endorse using cocaine only 3 days a week whereby she was using 7 days a week prior to this.  Cessation stressed to patient.  Social work consult.  Follow.  6.  Abnormal TSH TSH noted to be less than 0.010.  Check a free T4, T3.  Will need outpatient follow-up.   DVT prophylaxis: Heparin Code Status: Full Family Communication: Updated patient.  No family at bedside. Disposition Plan: Remain in stepdown today monitor.  BiPAP as needed.   Consultants:   None  Procedures:   Chest x-ray 12/02/2017    Antimicrobials:   IV azithromycin 1117 20191 dose.  IV Levaquin 12/03/2017   Subjective: Laying in bed on telephone.  States she feels a whole lot better.  Currently off BiPAP.  Asking for food.  Denies any chest pain.  States shortness of breath has improved since admission.  Objective: Vitals:   12/03/17 0800 12/03/17 0807 12/03/17 0816 12/03/17 0900  BP: (!) 135/92   140/88  Pulse: 100   (!) 104  Resp: 20   (!) 25  Temp:      TempSrc:      SpO2: 100%  100% 98%  Weight:  53 kg    Height:        Intake/Output Summary (Last 24 hours) at 12/03/2017  0933 Last data filed at 12/03/2017 0853 Gross per 24 hour  Intake 289.7 ml  Output 40 ml  Net 249.7 ml   Filed Weights   12/02/17 2300 12/03/17 0430 12/03/17 0807  Weight: 53.5 kg 56.1 kg 53 kg    Examination:  General exam: Appears calm and comfortable  Respiratory system: Fair air movement.  Decreased expiratory wheezing.  No crackles.  No rhonchi.  Speaking in full sentences.  Off BiPAP.   Cardiovascular system: Tachycardia.  No JVD, no murmurs, no rubs, no gallops.  No lower extremity edema.  Gastrointestinal system: Abdomen is nondistended, soft and nontender. No organomegaly or masses felt. Normal bowel sounds heard. Central nervous system: Alert and oriented. No focal neurological deficits. Extremities: Symmetric 5 x 5 power. Skin: No rashes, lesions or ulcers Psychiatry: Judgement and insight appear normal. Mood & affect appropriate.     Data Reviewed: I have personally reviewed following labs and imaging studies  CBC: Recent Labs  Lab 12/02/17 2311 12/03/17 0552  WBC 10.4 6.6  NEUTROABS 4.1  --   HGB 13.2 11.9*  HCT 42.4 39.4  MCV 90.6 91.6  PLT 400 412   Basic Metabolic Panel: Recent Labs  Lab 12/02/17 2311 12/03/17 0552  NA 141 141  K 4.4 3.9  CL 106 107  CO2 27 21*  GLUCOSE 132* 146*  BUN 11 14  CREATININE 1.22* 1.05*  CALCIUM 9.8 9.3  MG 2.1  --    GFR: Estimated Creatinine Clearance: 54.2 mL/min (A) (by C-G formula based on SCr of 1.05 mg/dL (H)). Liver Function Tests: Recent Labs  Lab 12/02/17 2311 12/03/17 0552  AST 34 33  ALT 24 23  ALKPHOS 71 65  BILITOT 0.5 0.6  PROT 7.9 7.2  ALBUMIN 4.2 3.9   No results for input(s): LIPASE, AMYLASE in the last 168 hours. No results for input(s): AMMONIA in the last 168 hours. Coagulation Profile: No results for input(s): INR, PROTIME in the last 168 hours. Cardiac Enzymes: No results for input(s): CKTOTAL, CKMB, CKMBINDEX, TROPONINI in the last 168 hours. BNP (last 3 results) No results  for input(s): PROBNP in the last 8760 hours. HbA1C: No results for input(s): HGBA1C in the last 72 hours. CBG: No results for input(s): GLUCAP in the last 168 hours. Lipid Profile: No results for input(s): CHOL, HDL, LDLCALC, TRIG, CHOLHDL, LDLDIRECT in the last 72 hours. Thyroid Function Tests: Recent Labs    12/03/17 0552  TSH <0.010*   Anemia Panel: No results for input(s): VITAMINB12, FOLATE, FERRITIN, TIBC, IRON, RETICCTPCT in the last 72 hours. Sepsis Labs: No results for input(s): PROCALCITON, LATICACIDVEN in the last 168 hours.  Recent Results (from the past 240 hour(s))  MRSA PCR Screening     Status: None   Collection Time: 12/03/17  6:30 AM  Result Value Ref Range Status   MRSA by PCR NEGATIVE NEGATIVE Final    Comment:        The GeneXpert MRSA Assay (FDA approved for NASAL specimens only), is one  component of a comprehensive MRSA colonization surveillance program. It is not intended to diagnose MRSA infection nor to guide or monitor treatment for MRSA infections. Performed at [facility], [place] [address], [place], [place] [address]          Radiology Studies: Dg Chest Portable 1 View  Result Date: 12/02/2017 CLINICAL DATA:  Shortness of breath, history of COPD EXAM: PORTABLE CHEST 1 VIEW COMPARISON:  CT chest 11/07/2017 FINDINGS: The lungs are hyperinflated likely secondary to COPD. There is no focal parenchymal opacity. There is no pleural effusion or pneumothorax. The cardiomediastinal silhouette is stable. The osseous structures are unremarkable. IMPRESSION: No active disease. Electronically Signed   By: Kathreen Devoid   On: 12/02/2017 23:27        Scheduled Meds: . aspirin EC  81 mg Oral Daily  . atorvastatin  40 mg Oral q1800  . budesonide (PULMICORT) nebulizer solution  0.5 mg Nebulization BID  . fluticasone  2 spray Each Nare Daily  . furosemide  20 mg Oral Daily  . heparin  5,000 Units Subcutaneous Q8H  . ipratropium   0.5 mg Nebulization Q6H  . levalbuterol  0.63 mg Nebulization Q6H  . lisinopril  20 mg Oral Daily  . loratadine  10 mg Oral Daily  . methylPREDNISolone (SOLU-MEDROL) injection  80 mg Intravenous Q6H  . nicotine  14 mg Transdermal Daily  . pantoprazole  40 mg Oral Q0600  . sodium chloride flush  3 mL Intravenous Q12H  . spironolactone  25 mg Oral Daily   Continuous Infusions: . sodium chloride    . levofloxacin (LEVAQUIN) IV       LOS: 0 days    Time spent: 40 minutes  No charge.    Irine Seal, MD [facility] Pager [phone]  If 7PM-7AM, please contact night-coverage [email or web address] Password [facility] 12/03/2017, 9:33 AM

## 2017-12-03 NOTE — Progress Notes (Signed)
Pt. Removed from bipap at this time and placed on [place] 2 lpm.  HR 106, Sats 99, RR 22.  Pt. Offers no complaints at this time.  Will monitor for signs of distress.

## 2017-12-03 NOTE — Progress Notes (Signed)
  Echocardiogram 2D Echocardiogram has been performed.  Walker Sitar G Tonisha Silvey 12/03/2017, 1:59 PM

## 2017-12-03 NOTE — Progress Notes (Signed)
Pt.s rhythm converted to SVT.  MD requested that pt. Be placed back on Bipap at this time to see if will decrease HR.  Pt. Placed back on with previous settings.  HR currently 165.

## 2017-12-04 ENCOUNTER — Other Ambulatory Visit (HOSPITAL_COMMUNITY): Payer: Self-pay

## 2017-12-04 DIAGNOSIS — Z853 Personal history of malignant neoplasm of breast: Secondary | ICD-10-CM

## 2017-12-04 DIAGNOSIS — J441 Chronic obstructive pulmonary disease with (acute) exacerbation: Secondary | ICD-10-CM

## 2017-12-04 LAB — CBC WITH DIFFERENTIAL/PLATELET
ABS IMMATURE GRANULOCYTES: 0.08 10*3/uL — AB (ref 0.00–0.07)
BASOS ABS: 0 10*3/uL (ref 0.0–0.1)
Basophils Relative: 0 %
Eosinophils Absolute: 0 10*3/uL (ref 0.0–0.5)
Eosinophils Relative: 0 %
HEMATOCRIT: 36.4 % (ref 36.0–46.0)
Hemoglobin: 11.3 g/dL — ABNORMAL LOW (ref 12.0–15.0)
Immature Granulocytes: 1 %
LYMPHS ABS: 1 10*3/uL (ref 0.7–4.0)
Lymphocytes Relative: 7 %
MCH: 27.8 pg (ref 26.0–34.0)
MCHC: 31 g/dL (ref 30.0–36.0)
MCV: 89.4 fL (ref 80.0–100.0)
MONOS PCT: 2 %
Monocytes Absolute: 0.3 10*3/uL (ref 0.1–1.0)
Neutro Abs: 12.8 10*3/uL — ABNORMAL HIGH (ref 1.7–7.7)
Neutrophils Relative %: 90 %
Platelets: 327 10*3/uL (ref 150–400)
RBC: 4.07 MIL/uL (ref 3.87–5.11)
RDW: 14.6 % (ref 11.5–15.5)
WBC: 14.2 10*3/uL — ABNORMAL HIGH (ref 4.0–10.5)
nRBC: 0 % (ref 0.0–0.2)

## 2017-12-04 LAB — BASIC METABOLIC PANEL
ANION GAP: 10 (ref 5–15)
BUN: 12 mg/dL (ref 6–20)
CALCIUM: 9.2 mg/dL (ref 8.9–10.3)
CO2: 22 mmol/L (ref 22–32)
Chloride: 106 mmol/L (ref 98–111)
Creatinine, Ser: 0.7 mg/dL (ref 0.44–1.00)
GFR calc non Af Amer: >60 mL/min (ref >60)
Glucose, Bld: 135 mg/dL — ABNORMAL HIGH (ref 70–99)
Potassium: 4.3 mmol/L (ref 3.5–5.1)
Sodium: 138 mmol/L (ref 135–145)

## 2017-12-04 LAB — URINE CULTURE

## 2017-12-04 LAB — BLOOD GAS, VENOUS
Acid-base deficit: 1.6 mmol/L (ref 0.0–2.0)
BICARBONATE: 24.4 mmol/L (ref 20.0–28.0)
Delivery systems: POSITIVE
Expiratory PAP: 8
FIO2: 50
INSPIRATORY PAP: 18
MODE: POSITIVE
O2 Saturation: 85.5 %
PCO2 VEN: 49.1 mmHg (ref 44.0–60.0)
PO2 VEN: 59.2 mmHg — AB (ref 32.0–45.0)
Patient temperature: 98.6
pH, Ven: 7.317 (ref 7.250–7.430)

## 2017-12-04 LAB — T3: T3 TOTAL: 220 ng/dL — AB (ref 71–180)

## 2017-12-04 LAB — T4, FREE: FREE T4: 0.92 ng/dL (ref 0.82–1.77)

## 2017-12-04 LAB — T3, FREE: T3 FREE: 4.3 pg/mL (ref 2.0–4.4)

## 2017-12-04 MED ORDER — IPRATROPIUM BROMIDE 0.02 % IN SOLN: 0.5000 mg | Freq: Two times a day (BID) | RESPIRATORY_TRACT | Status: DC

## 2017-12-04 MED ORDER — FLUTICASONE PROPIONATE 50 MCG/ACT NA SUSP: 2.0000 | Freq: Every day | NASAL | 0 refills | Status: DC

## 2017-12-04 MED ORDER — LEVALBUTEROL HCL 0.63 MG/3ML IN NEBU: 0.6300 mg | INHALATION_SOLUTION | Freq: Two times a day (BID) | RESPIRATORY_TRACT | Status: DC

## 2017-12-04 MED ORDER — ADULT MULTIVITAMIN W/MINERALS CH
1.0000 | ORAL_TABLET | Freq: Every day | ORAL | Status: DC
  Administered 2017-12-04: 1 via ORAL
  Filled 2017-12-04: qty 1

## 2017-12-04 MED ORDER — SPIRONOLACTONE 25 MG PO TABS: 25.0000 mg | ORAL_TABLET | Freq: Every day | ORAL | 0 refills | Status: DC

## 2017-12-04 MED ORDER — PREDNISONE 20 MG PO TABS: 20.0000 mg | ORAL_TABLET | Freq: Every day | ORAL | 0 refills | Status: DC

## 2017-12-04 MED ORDER — ATORVASTATIN CALCIUM 40 MG PO TABS: 40.0000 mg | ORAL_TABLET | Freq: Every day | ORAL | 0 refills | Status: DC

## 2017-12-04 MED ORDER — LISINOPRIL 20 MG PO TABS: 20.0000 mg | ORAL_TABLET | Freq: Every day | ORAL | 0 refills | Status: DC

## 2017-12-04 MED ORDER — METHYLPREDNISOLONE SODIUM SUCC 125 MG IJ SOLR
60.0000 mg | Freq: Three times a day (TID) | INTRAMUSCULAR | Status: DC
  Administered 2017-12-04: 60 mg via INTRAVENOUS
  Filled 2017-12-04: qty 2

## 2017-12-04 MED ORDER — FAMOTIDINE 20 MG PO TABS: 20.0000 mg | ORAL_TABLET | Freq: Two times a day (BID) | ORAL | 0 refills | Status: DC

## 2017-12-04 MED ORDER — TIOTROPIUM BROMIDE MONOHYDRATE 18 MCG IN CAPS: 18.0000 ug | ORAL_CAPSULE | Freq: Every day | RESPIRATORY_TRACT | 0 refills | Status: DC

## 2017-12-04 MED ORDER — AMLODIPINE BESYLATE 5 MG PO TABS: 5.0000 mg | ORAL_TABLET | Freq: Every day | ORAL | 0 refills | Status: DC

## 2017-12-04 MED ORDER — NICOTINE 14 MG/24HR TD PT24: 14.0000 mg | MEDICATED_PATCH | Freq: Every day | TRANSDERMAL | 0 refills | Status: DC

## 2017-12-04 MED ORDER — METHYLPREDNISOLONE SODIUM SUCC 125 MG IJ SOLR: 80.0000 mg | Freq: Three times a day (TID) | INTRAMUSCULAR | Status: DC

## 2017-12-04 MED ORDER — BUDESONIDE-FORMOTEROL FUMARATE 80-4.5 MCG/ACT IN AERO: 2.0000 | INHALATION_SPRAY | Freq: Two times a day (BID) | RESPIRATORY_TRACT | 0 refills | Status: DC

## 2017-12-04 MED ORDER — LORATADINE 10 MG PO TABS: 10.0000 mg | ORAL_TABLET | Freq: Every day | ORAL | Status: DC

## 2017-12-04 MED ORDER — ALBUTEROL SULFATE HFA 108 (90 BASE) MCG/ACT IN AERS: 1.0000 | INHALATION_SPRAY | Freq: Four times a day (QID) | RESPIRATORY_TRACT | 0 refills | Status: DC | PRN

## 2017-12-04 MED ORDER — LEVOFLOXACIN 500 MG PO TABS
500.0000 mg | ORAL_TABLET | Freq: Every day | ORAL | Status: DC
  Administered 2017-12-04: 500 mg via ORAL
  Filled 2017-12-04: qty 1

## 2017-12-04 MED ORDER — LEVOFLOXACIN 500 MG PO TABS: 500.0000 mg | ORAL_TABLET | Freq: Every day | ORAL | 0 refills | Status: AC

## 2017-12-04 MED ORDER — FUROSEMIDE 20 MG PO TABS: 20.0000 mg | ORAL_TABLET | Freq: Every day | ORAL | 0 refills | Status: DC

## 2017-12-04 MED ORDER — BENZONATATE 100 MG PO CAPS: 100.0000 mg | ORAL_CAPSULE | Freq: Three times a day (TID) | ORAL | 0 refills | Status: DC | PRN

## 2017-12-04 NOTE — Progress Notes (Signed)
PROGRESS NOTE    Sabrina Holland  OVZ:[id number] DOB: [birth year] DOA: 12/02/2017 PCP: Gildardo Pounds, NP    Brief Narrative: Patient is a 47 year old female history of advanced systolic heart failure EF of 20 to 25% 2D echo June 2019, history of COPD, hypertension, cocaine abuse, history of breast cancer, TIA presented to the ED with worsening shortness of breath and wheezing.  Patient placed on the BiPAP immediately in the ED due to worsening respiratory distress.  Chest x-ray was negative for any infiltrate or volume overload.  Patient noted to have active Tory wheezing.  Patient admitted to the stepdown unit for an acute COPD exacerbation and placed on IV steroids, BiPAP, IV Levaquin, nebulizer treatment.  Patient noted to have a significant tachycardia with elevated blood pressure of 167/123.   Assessment & Plan:   Principal Problem:   Acute on chronic respiratory failure with hypoxemia (HCC) Active Problems:   History of breast cancer   HTN (hypertension)   COPD (chronic obstructive pulmonary disease) (HCC)   Cardiomyopathy (HCC)   COPD exacerbation (HCC)  #1 acute on chronic respiratory failure with hypoxemia secondary to an acute COPD exacerbation.   Patient presented with acute on chronic respiratory distress secondary to his COPD exacerbation requiring BiPAP.  Chest x-ray negative for any acute infiltrate or volume overload.  Patient improving clinically.  Patient now off BiPAP x24 hours.  Change IV Levaquin to oral Levaquin to complete a 5-day course.  Discontinue BiPAP.  Taper IV Solu-Medrol to 60 mg every 8 hours.  Continue scheduled Xopenex and Atrovent nebs, Pulmicort, Claritin, Flonase, PPI.  Tessalon Perles as needed.  Follow.    2.  Hypertension Blood pressure improved on home regimen of lisinopril 20 mg daily, home dose Lasix 20 mg daily, spironolactone 25 mg daily.  Follow.  3.  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  Outpatient follow-up.  4.  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  2D echo 06/21/2017.  Patient is being followed by cardiology who had recommended repeat 2D echo in September 2019 and if patient's EF remains depressed may need ischemic evaluation.  Patient states supposed to have a 2D echo this coming week.  2D echo was done 12/03/2017 with a EF of 25 to 30%, diffuse hypokinesis, mild mitral valvular regurgitation, mildly dilated left atrium, lipomatous hypertrophy of the atrial septum, PA peak pressure 43 mmHg.  EF similar to 2D echo of June 2019.  Continue home regimen of Lasix, lisinopril, spironolactone.  Due to patient's ongoing cocaine use patient is not a candidate at this time for Entresto oral beta-blocker and may not be a ICD either per cardiology notes.  Will need outpatient follow-up with cardiology for evaluation for ischemic work-up.  5.  Polysubstance use Patient does endorse using cocaine only 3 days a week whereby she was using 7 days a week prior to this.  Cessation stressed to patient.  Social work consulted.  Follow.  6.  Abnormal TSH TSH noted to be less than 0.010.  Free T4 within normal limits at 0.92.  Will need repeat thyroid function studies done in 4 to 6 weeks in the outpatient setting.    DVT prophylaxis: Heparin Code Status: Full Family Communication: Updated patient.  Updated daughter via telephone.   Disposition Plan: Likely home once medically stable and clinically improved.  Consultants:   None  Procedures:   Chest x-ray 12/02/2017  2D echo 12/03/2017  Antimicrobials:   IV azithromycin 1117 20191 dose.  IV Levaquin 12/03/2017 >>>>> oral Levaquin 12/04/2017   Subjective: Laying in bed.  States shortness of breath is improving.  Currently off BiPAP.  Tolerating oral intake.   Denies any chest pain.  No abdominal pain.   Objective: Vitals:   12/04/17 0600 12/04/17 0700 12/04/17 0800 12/04/17 0827  BP: 126/87  137/81   Pulse: 87 89 88   Resp: 18 (!) 21 19   Temp:      TempSrc:      SpO2: 97% 99% 98% 100%  Weight:      Height:        Intake/Output Summary (Last 24 hours) at 12/04/2017 1035 Last data filed at 12/04/2017 0900 Gross per 24 hour  Intake 1350.78 ml  Output 900 ml  Net 450.78 ml   Filed Weights   12/02/17 2300 12/03/17 0430 12/03/17 0807  Weight: 53.5 kg 56.1 kg 53 kg    Examination:  General exam: NAD Respiratory system: Fair air movement.  Decreased expiratory wheezing.  No crackles.  No rhonchi.  Speaking in full sentences.  Off BiPAP since yesterday.   Cardiovascular system: Regular rate rhythm no murmurs rubs or gallops.  No JVD.  No lower extremity edema.   Gastrointestinal system: Abdomen is soft, nontender, nondistended, positive bowel sounds.  No rebound.  No guarding.  Central nervous system: Alert and oriented. No focal neurological deficits. Extremities: Symmetric 5 x 5 power. Skin: No rashes, lesions or ulcers Psychiatry: Judgement and insight appear normal. Mood & affect appropriate.     Data Reviewed: I have personally reviewed following labs and imaging studies  CBC: Recent Labs  Lab 12/02/17 2311 12/03/17 0552 12/04/17 0325  WBC 10.4 6.6 14.2*  NEUTROABS 4.1  --  12.8*  HGB 13.2 11.9* 11.3*  HCT 42.4 39.4 36.4  MCV 90.6 91.6 89.4  PLT 400 350 956   Basic Metabolic Panel: Recent Labs  Lab 12/02/17 2311 12/03/17 0552 12/04/17 0325  NA 141 141 138  K 4.4 3.9 4.3  CL 106 107 106  CO2 27 21* 22  GLUCOSE 132* 146* 135*  BUN 11 14 12   CREATININE 1.22* 1.05* 0.70  CALCIUM 9.8 9.3 9.2  MG 2.1  --   --    GFR: Estimated Creatinine Clearance: 71.2 mL/min (by C-G formula based on SCr of 0.7 mg/dL). Liver Function Tests: Recent Labs  Lab 12/02/17 2311 12/03/17 0552  AST 34 33  ALT 24 23  ALKPHOS  71 65  BILITOT 0.5 0.6  PROT 7.9 7.2  ALBUMIN 4.2 3.9   No results for input(s): LIPASE, AMYLASE in the last 168 hours. No results for input(s): AMMONIA in the last 168 hours. Coagulation Profile: No results for input(s): INR, PROTIME in the last 168 hours. Cardiac Enzymes: No results for input(s): CKTOTAL, CKMB, CKMBINDEX, TROPONINI in the last 168 hours. BNP (last 3 results) No results for input(s): PROBNP in the last 8760 hours. HbA1C: No results for input(s): HGBA1C in the last 72 hours. CBG: No results for input(s): GLUCAP in the last 168 hours. Lipid Profile: No results for input(s): CHOL, HDL, LDLCALC, TRIG, CHOLHDL, LDLDIRECT in the last 72 hours. Thyroid Function Tests: Recent Labs    12/03/17 0552 12/03/17 0918  TSH <0.010*  --   FREET4  --  0.92   Anemia Panel: No results for input(s): VITAMINB12, FOLATE, FERRITIN, TIBC, IRON,  RETICCTPCT in the last 72 hours. Sepsis Labs: No results for input(s): PROCALCITON, LATICACIDVEN in the last 168 hours.  Recent Results (from the past 240 hour(s))  MRSA PCR Screening     Status: None   Collection Time: 12/03/17  6:30 AM  Result Value Ref Range Status   MRSA by PCR NEGATIVE NEGATIVE Final    Comment:        The GeneXpert MRSA Assay (FDA approved for NASAL specimens only), is one component of a comprehensive MRSA colonization surveillance program. It is not intended to diagnose MRSA infection nor to guide or monitor treatment for MRSA infections. Performed at [facility], [place] [address], [place], [place] [address]   Culture, Urine     Status: Abnormal   Collection Time: 12/03/17  8:54 AM  Result Value Ref Range Status   Specimen Description   Final    URINE, RANDOM Performed at [place] [address] [place], [place] [address]    Special Requests   Final    NONE Performed at [facility], [place] [address], [place], [place] [address]    Culture  MULTIPLE SPECIES PRESENT, SUGGEST RECOLLECTION (A)  Final   Report Status 12/04/2017 FINAL  Final         Radiology Studies: Dg Chest Portable 1 View  Result Date: 12/02/2017 CLINICAL DATA:  Shortness of breath, history of COPD EXAM: PORTABLE CHEST 1 VIEW COMPARISON:  CT chest 11/07/2017 FINDINGS: The lungs are hyperinflated likely secondary to COPD. There is no focal parenchymal opacity. There is no pleural effusion or pneumothorax. The cardiomediastinal silhouette is stable. The osseous structures are unremarkable. IMPRESSION: No active disease. Electronically Signed   By: Kathreen Devoid   On: 12/02/2017 23:27        Scheduled Meds: . aspirin EC  81 mg Oral Daily  . atorvastatin  40 mg Oral q1800  . budesonide (PULMICORT) nebulizer solution  0.5 mg Nebulization BID  . fluticasone  2 spray Each Nare Daily  . furosemide  20 mg Oral Daily  . heparin  5,000 Units Subcutaneous Q8H  . ipratropium  0.5 mg Nebulization TID  . levalbuterol  0.63 mg Nebulization TID  . levofloxacin  500 mg Oral Daily  . lisinopril  20 mg Oral Daily  . loratadine  10 mg Oral Daily  . methylPREDNISolone (SOLU-MEDROL) injection  80 mg Intravenous Q8H  . nicotine  14 mg Transdermal Daily  . pantoprazole  40 mg Oral Q0600  . sodium chloride flush  3 mL Intravenous Q12H  . spironolactone  25 mg Oral Daily   Continuous Infusions: . sodium chloride Stopped (12/03/17 1207)     LOS: 1 day    Time spent: 35 minutes  No charge.    Irine Seal, MD [facility] Pager [phone]  If 7PM-7AM, please contact night-coverage [email or web address] Password [email or web address] 12/04/2017, 10:35 AM

## 2017-12-04 NOTE — Progress Notes (Signed)
Discharge education provided to patient. Patient verbalizes understanding. Opportunity for questions provided. Belongings with patient.  PIV removed.  Patient walked down to main entrance by RN. Patient accompanied by son.

## 2017-12-04 NOTE — Discharge Summary (Signed)
Physician Discharge Summary  Sabrina Holland JXB:[id number] DOB: 10/12/1970 DOA: 12/02/2017  PCP: Sabrina Pounds, NP  Admit date: 12/02/2017 Discharge date: 12/04/2017  Time spent: 60 minutes  Recommendations for Outpatient Follow-up:  1. Follow-up with Sabrina Pounds, NP as scheduled.  On follow-up patient will need a basic metabolic profile done to follow-up on electrolytes and renal function.  Patient's COPD will need to be reassessed.  Patient will need repeat thyroid function studies done in about 4 to 6 weeks to follow-up on abnormal TSH. 2. Follow-up with cardiology as scheduled.  Repeat 2D echo was done during this hospitalization with no change in ejection fraction of 25 to 30% with diffuse hypokinesis.  Patient will need ischemic work-up per cardiology.   Discharge Diagnoses:  Principal Problem:   Acute on chronic respiratory failure with hypoxemia (HCC) Active Problems:   History of breast cancer   HTN (hypertension)   COPD (chronic obstructive pulmonary disease) (HCC)   Cardiomyopathy ([place])   COPD exacerbation ([place])   Discharge Condition: Stable and improved  Diet recommendation: Heart healthy  Filed Weights   12/02/17 2300 12/03/17 0430 12/03/17 0807  Weight: 53.5 kg 56.1 kg 53 kg    History of present illness:  Per Dr. Antony Salmon [name] is a 47 y.o. female with medical history significant of advanced systolic dysfunction CHF with a EF of 25 to 30% from previous echocardiogram in June of this year, history of COPD, hypertension, breast cancer and TIA who came to the ER with significant shortness of breath cough and wheezing.  Patient was immediately placed on BiPAP secondary to tachypnea with hypoxemia on ABG.  She was struggling to breathe but finally able to come down on the BiPAP.  She denied any change in her medications.  No change in her current situation.  Chest x-ray showed no obvious fluid overload.  Patient was having significant  expiratory wheezing.  She has received Solu-Medrol with nebulizer in the ER and has improved.  She is being admitted with acute on chronic respiratory failure most likely COPD exacerbation at this point.  ED Course: Temperature is 98 for blood pressure 167/123 pulse 182 respirate of 48 oxygen sats 95% on BiPAP.  CBC appears normal chemistry appears within range except for creatinine 1.22 and glucose of 132.  Chest x-ray showed no acute findings.  proBNP was 1499.  He was only 456 on October 21.  Hospital Course:  1 acute on chronic respiratory failure with hypoxemia secondary to an acute COPD exacerbation.   Patient presented with acute on chronic respiratory distress secondary to his COPD exacerbation requiring BiPAP.  Chest x-ray negative for any acute infiltrate or volume overload.  Patient initially was placed on the BiPAP as well as IV steroids, IV Levaquin, scheduled nebulizer, Pulmicort, Claritin, Flonase, PPI.  Patient improved clinically BiPAP was subsequently discontinued.  IV steroids were tapered down.  IV Levaquin was transitioned to oral Levaquin.  Patient was satting 98% on ambulation on room air by day of discharge.  Patient be discharged home in stable and improved condition on 3 more days of oral Levaquin to complete a 5-day course of antibiotic treatment, steroid taper, Symbicort, Spiriva, albuterol inhaler.  Patient be discharged in stable and improved condition to follow-up with PCP in the outpatient setting.    2.  Hypertension Blood pressure improved on resumption of home regimen of lisinopril 20 mg daily, home dose Lasix 20 mg daily, spironolactone 25 mg daily.  Patient's Norvasc will be resumed  on discharge.  3.  History of breast cancer/invasive ductal carcinoma status post left mastectomy 2008 Patient was treated with chemo, tamoxifen.  Outpatient follow-up.  4.  Cardiomyopathy/chronic combined systolic and diastolic heart failure Patient noted to have a EF of 25% with  grade 3 diastolic dysfunction, severe LAE, moderate MR.  2D echo 06/21/2017.  Patient is being followed by cardiology who had recommended repeat 2D echo in September 2019 and if patient's EF remains depressed may need ischemic evaluation.  Patient states supposed to have a 2D echo this coming week.  2D echo was done 12/03/2017 with a EF of 25 to 30%, diffuse hypokinesis, mild mitral valvular regurgitation, mildly dilated left atrium, lipomatous hypertrophy of the atrial septum, PA peak pressure 43 mmHg.  EF similar to 2D echo of June 2019.  Continue home regimen of Lasix, lisinopril, spironolactone.  Due to patient's ongoing cocaine use patient is not a candidate at this time for Entresto oral beta-blocker and may not be a ICD either per cardiology notes.  Will need outpatient follow-up with cardiology for evaluation for ischemic work-up.  5.  Polysubstance use Patient did endorse using cocaine only 3 days a week whereby in the past, she was using 7 days a week prior to this.  Cessation stressed to patient.  Social work consulted.    6.  Abnormal TSH TSH noted to be less than 0.010.  Free T4 within normal limits at 0.92.  Will need repeat thyroid function studies done in 4 to 6 weeks in the outpatient setting  Procedures:  Chest x-ray 12/02/2017  2D echo 12/03/2017  Consultations:  None  Discharge Exam: Vitals:   12/04/17 1611 12/04/17 1800  BP: 140/88 (!) 144/91  Pulse:  90  Resp:  20  Temp:    SpO2:  99%    General: NAD Cardiovascular: RRR Respiratory: Minimal expiratory wheezing.  Discharge Instructions   Discharge Instructions    Diet - low sodium heart healthy   Complete by:  As directed    Increase activity slowly   Complete by:  As directed      Allergies as of 12/04/2017      Reactions   Penicillins Other (See Comments)   Pt had slight throat swelling Has patient had a PCN reaction causing immediate rash, facial/tongue/throat swelling, SOB or lightheadedness with  hypotension: No Has patient had a PCN reaction causing severe rash involving mucus membranes or skin necrosis: No Has patient had a PCN reaction that required hospitalization No Has patient had a PCN reaction occurring within the last 10 years: No If all of the above answers are "NO", then may proceed with Cephalosporin use.      Medication List    STOP taking these medications   guaiFENesin-codeine 100-10 MG/5ML syrup Commonly known as:  ROBITUSSIN AC     TAKE these medications   albuterol 108 (90 Base) MCG/ACT inhaler Commonly known as:  PROVENTIL HFA;VENTOLIN HFA Inhale 1-2 puffs into the lungs every 6 (six) hours as needed for wheezing or shortness of breath. Use 2 puffs 3 times daily x 3 days, then every 6 hours as needed. What changed:  additional instructions   amLODipine 5 MG tablet Commonly known as:  NORVASC Take 1 tablet (5 mg total) by mouth daily.   aspirin 81 MG EC tablet Take 1 tablet (81 mg total) by mouth daily.   atorvastatin 40 MG tablet Commonly known as:  LIPITOR Take 1 tablet (40 mg total) by mouth daily at 6 PM.  benzonatate 100 MG capsule Commonly known as:  TESSALON Take 1 capsule (100 mg total) by mouth 3 (three) times daily as needed for cough.   budesonide-formoterol 80-4.5 MCG/ACT inhaler Commonly known as:  SYMBICORT Inhale 2 puffs into the lungs 2 (two) times daily.   famotidine 20 MG tablet Commonly known as:  PEPCID Take 1 tablet (20 mg total) by mouth 2 (two) times daily.   fluticasone 50 MCG/ACT nasal spray Commonly known as:  FLONASE Place 2 sprays into both nostrils daily. Start taking on:  12/05/2017   furosemide 20 MG tablet Commonly known as:  LASIX Take 1 tablet (20 mg total) by mouth daily. Start taking on:  12/05/2017   levofloxacin 500 MG tablet Commonly known as:  LEVAQUIN Take 1 tablet (500 mg total) by mouth daily for 3 days. Start taking on:  12/05/2017   lisinopril 20 MG tablet Commonly known as:   PRINIVIL,ZESTRIL Take 1 tablet (20 mg total) by mouth daily. Start taking on:  12/05/2017 What changed:    medication strength  how much to take   loratadine 10 MG tablet Commonly known as:  CLARITIN Take 1 tablet (10 mg total) by mouth daily. Start taking on:  12/05/2017   multivitamin with minerals Tabs tablet Take 1 tablet by mouth daily.   nicotine 14 mg/24hr patch Commonly known as:  NICODERM CQ - dosed in mg/24 hours Place 1 patch (14 mg total) onto the skin daily.   predniSONE 20 MG tablet Commonly known as:  DELTASONE Take 1-3 tablets (20-60 mg total) by mouth daily with breakfast. Take 3 tablets (60)mg daily x 3 days, then 2 tablets (40mg ) daily x 3 days, then 1 tablet (20mg ) daily x 3 days then stop.   spironolactone 25 MG tablet Commonly known as:  ALDACTONE Take 1 tablet (25 mg total) by mouth daily.   tiotropium 18 MCG inhalation capsule Commonly known as:  SPIRIVA Place 1 capsule (18 mcg total) into inhaler and inhale daily.      Allergies  Allergen Reactions  . Penicillins Other (See Comments)    Pt had slight throat swelling Has patient had a PCN reaction causing immediate rash, facial/tongue/throat swelling, SOB or lightheadedness with hypotension: No Has patient had a PCN reaction causing severe rash involving mucus membranes or skin necrosis: No Has patient had a PCN reaction that required hospitalization No Has patient had a PCN reaction occurring within the last 10 years: No If all of the above answers are "NO", then may proceed with Cephalosporin use.    Follow-up Information    Sabrina Pounds, NP Follow up.   Specialty:  Nurse Practitioner Why:  f/u as scheduled. Contact information: [place] [address] [phone]        Sanda Klein, MD .   Specialty:  Cardiology Contact information: [address] [place] [address] [phone]            The results of significant diagnostics  from this hospitalization (including imaging, microbiology, ancillary and laboratory) are listed below for reference.    Significant Diagnostic Studies: Dg Chest 2 View  Result Date: 11/06/2017 CLINICAL DATA:  Shortness of breath with cough and congestion EXAM: CHEST - 2 VIEW COMPARISON:  Chest radiograph and chest CT June 20, 2017 FINDINGS: There is no edema or consolidation. The heart size and pulmonary vascularity are normal. No adenopathy. No bone lesions. IMPRESSION: No edema or consolidation. Electronically Signed   By: Lowella Grip III M.D.  On: 11/06/2017 11:08   Ct Angio Chest Pe W/cm &/or Wo Cm  Result Date: 11/07/2017 CLINICAL DATA:  Shortness of breath with exertion, chest pain, cough for 3 weeks EXAM: CT ANGIOGRAPHY CHEST WITH CONTRAST TECHNIQUE: Multidetector CT imaging of the chest was performed using the standard protocol during bolus administration of intravenous contrast. Multiplanar CT image reconstructions and MIPs were obtained to evaluate the vascular anatomy. CONTRAST:  136mL ISOVUE-370 IOPAMIDOL (ISOVUE-370) INJECTION 76% COMPARISON:  06/20/2017 FINDINGS: Cardiovascular: Satisfactory opacification of the pulmonary arteries to the segmental level. No evidence of pulmonary embolism. Mild stable cardiomegaly. No pericardial effusion. Mild thoracic aortic atherosclerosis. Mediastinum/Nodes: No enlarged mediastinal, hilar, or axillary lymph nodes. Thyroid gland, trachea, and esophagus demonstrate no significant findings. Lungs/Pleura: Bilateral centrilobular emphysema. No pleural effusion or pneumothorax. No focal consolidation. Focal area of emphysematous changes at left lung base. Mild right middle lobe scarring. Small biapical bullous disease. Upper Abdomen: No acute upper abdominal abnormality. Musculoskeletal: No acute osseous abnormality. No aggressive osseous lesion. Review of the MIP images confirms the above findings. IMPRESSION: 1. No pulmonary embolus. 2. No thoracic  aortic aneurysm or dissection. 3.  Aortic Atherosclerosis (ICD10-I70.0). 4.  Emphysema (ICD10-J43.9). Electronically Signed   By: Kathreen Devoid   On: 11/07/2017 11:00   Dg Chest Portable 1 View  Result Date: 12/02/2017 CLINICAL DATA:  Shortness of breath, history of COPD EXAM: PORTABLE CHEST 1 VIEW COMPARISON:  CT chest 11/07/2017 FINDINGS: The lungs are hyperinflated likely secondary to COPD. There is no focal parenchymal opacity. There is no pleural effusion or pneumothorax. The cardiomediastinal silhouette is stable. The osseous structures are unremarkable. IMPRESSION: No active disease. Electronically Signed   By: Kathreen Devoid   On: 12/02/2017 23:27    Microbiology: Recent Results (from the past 240 hour(s))  MRSA PCR Screening     Status: None   Collection Time: 12/03/17  6:30 AM  Result Value Ref Range Status   MRSA by PCR NEGATIVE NEGATIVE Final    Comment:        The GeneXpert MRSA Assay (FDA approved for NASAL specimens only), is one component of a comprehensive MRSA colonization surveillance program. It is not intended to diagnose MRSA infection nor to guide or monitor treatment for MRSA infections. Performed at [facility], [place] [address], [place], [place] [address]   Culture, Urine     Status: Abnormal   Collection Time: 12/03/17  8:54 AM  Result Value Ref Range Status   Specimen Description   Final    URINE, RANDOM Performed at [place] [place] [address] [place], [place] [address]    Special Requests   Final    NONE Performed at [facility], [place] [address], [place], [place] [address]    Culture MULTIPLE SPECIES PRESENT, SUGGEST RECOLLECTION (A)  Final   Report Status 12/04/2017 FINAL  Final     Labs: Basic Metabolic Panel: Recent Labs  Lab 12/02/17 2311 12/03/17 0552 12/04/17 0325  NA 141 141 138  K 4.4 3.9 4.3  CL 106 107 106  CO2 27 21* 22  GLUCOSE 132* 146* 135*  BUN 11 14 12   CREATININE  1.22* 1.05* 0.70  CALCIUM 9.8 9.3 9.2  MG 2.1  --   --    Liver Function Tests: Recent Labs  Lab 12/02/17 2311 12/03/17 0552  AST 34 33  ALT 24 23  ALKPHOS 71 65  BILITOT 0.5 0.6  PROT 7.9 7.2  ALBUMIN 4.2 3.9   No  results for input(s): LIPASE, AMYLASE in the last 168 hours. No results for input(s): AMMONIA in the last 168 hours. CBC: Recent Labs  Lab 12/02/17 2311 12/03/17 0552 12/04/17 0325  WBC 10.4 6.6 14.2*  NEUTROABS 4.1  --  12.8*  HGB 13.2 11.9* 11.3*  HCT 42.4 39.4 36.4  MCV 90.6 91.6 89.4  PLT 400 350 327   Cardiac Enzymes: No results for input(s): CKTOTAL, CKMB, CKMBINDEX, TROPONINI in the last 168 hours. BNP: BNP (last 3 results) Recent Labs    06/21/17 0519 11/06/17 1023 12/02/17 2311  BNP 1,844.5* 456.2* 1,499.0*    ProBNP (last 3 results) No results for input(s): PROBNP in the last 8760 hours.  CBG: No results for input(s): GLUCAP in the last 168 hours.     Signed:  Irine Seal MD.  [facility] 12/04/2017, 6:54 PM

## 2017-12-04 NOTE — Progress Notes (Signed)
Patient Saturations on Room Air at Rest = 100%  Patient Saturations on [employer] while Ambulating = 98%

## 2017-12-05 ENCOUNTER — Encounter: Payer: Self-pay | Admitting: Nurse Practitioner

## 2017-12-05 ENCOUNTER — Ambulatory Visit: Payer: Self-pay | Attending: Nurse Practitioner | Admitting: Nurse Practitioner

## 2017-12-05 VITALS — BP 128/93 | HR 112 | Temp 98.2°F | Ht 65.0 in | Wt 132.0 lb

## 2017-12-05 DIAGNOSIS — Z8673 Personal history of transient ischemic attack (TIA), and cerebral infarction without residual deficits: Secondary | ICD-10-CM | POA: Insufficient documentation

## 2017-12-05 DIAGNOSIS — E782 Mixed hyperlipidemia: Secondary | ICD-10-CM | POA: Insufficient documentation

## 2017-12-05 DIAGNOSIS — I10 Essential (primary) hypertension: Secondary | ICD-10-CM | POA: Insufficient documentation

## 2017-12-05 DIAGNOSIS — Z853 Personal history of malignant neoplasm of breast: Secondary | ICD-10-CM | POA: Insufficient documentation

## 2017-12-05 DIAGNOSIS — Z9012 Acquired absence of left breast and nipple: Secondary | ICD-10-CM | POA: Insufficient documentation

## 2017-12-05 DIAGNOSIS — Z836 Family history of other diseases of the respiratory system: Secondary | ICD-10-CM | POA: Insufficient documentation

## 2017-12-05 DIAGNOSIS — Z880 Allergy status to penicillin: Secondary | ICD-10-CM | POA: Insufficient documentation

## 2017-12-05 DIAGNOSIS — J441 Chronic obstructive pulmonary disease with (acute) exacerbation: Secondary | ICD-10-CM | POA: Insufficient documentation

## 2017-12-05 DIAGNOSIS — F17210 Nicotine dependence, cigarettes, uncomplicated: Secondary | ICD-10-CM | POA: Insufficient documentation

## 2017-12-05 DIAGNOSIS — Z79899 Other long term (current) drug therapy: Secondary | ICD-10-CM | POA: Insufficient documentation

## 2017-12-05 DIAGNOSIS — I429 Cardiomyopathy, unspecified: Secondary | ICD-10-CM

## 2017-12-05 DIAGNOSIS — F1490 Cocaine use, unspecified, uncomplicated: Secondary | ICD-10-CM | POA: Insufficient documentation

## 2017-12-05 DIAGNOSIS — Z7982 Long term (current) use of aspirin: Secondary | ICD-10-CM | POA: Insufficient documentation

## 2017-12-05 DIAGNOSIS — R0902 Hypoxemia: Secondary | ICD-10-CM | POA: Insufficient documentation

## 2017-12-05 MED FILL — ALBUTEROL SULFATE HFA 108 (: 108 (90 BAS | 25 days supply | Qty: 18 | Fill #0

## 2017-12-05 MED FILL — SPIRONOLACTONE 25 MG TABLET: 25 | 30 days supply | Qty: 30 | Fill #0

## 2017-12-05 MED FILL — levoFLOXacin 500 MG TABS: 500 | 3 days supply | Qty: 3 | Fill #0

## 2017-12-05 MED FILL — FUROSEMIDE 20 MG TABLET: 20 | 30 days supply | Qty: 30 | Fill #0

## 2017-12-05 MED FILL — ATORVASTATIN CALCIUM 40 MG: 40 | 30 days supply | Qty: 30 | Fill #0

## 2017-12-05 MED FILL — LISINOPRIL 20 MG TAB: 20 | 30 days supply | Qty: 30 | Fill #0

## 2017-12-05 NOTE — Patient Instructions (Signed)

## 2017-12-05 NOTE — Progress Notes (Signed)
Assessment & Plan:  [name] was seen today for hypertension.  Diagnoses and all orders for this visit:  COPD with acute exacerbation ([place]) Continue all medications as prescribed. Follow up with pulmonology and cardiology as instructed  Essential hypertension Continue all antihypertensives as prescribed.  Remember to bring in your blood pressure log with you for your follow up appointment.  DASH/Mediterranean Diets are healthier choices for HTN.   Mixed hyperlipidemia INSTRUCTIONS: Work on a low fat, heart healthy diet and participate in regular aerobic exercise program by working out at least 150 minutes per week; 5 days a week-30 minutes per day. Avoid red meat, fried foods. junk foods, sodas, sugary drinks, unhealthy snacking, alcohol and smoking.  Drink at least 48oz of water per day and monitor your carbohydrate intake daily.  Lab Results  Component Value Date   [place] 80 06/21/2017      Patient has been counseled on age-appropriate routine health concerns for screening and prevention. These are reviewed and up-to-date. Referrals have been placed accordingly. Immunizations are up-to-date or declined.    Subjective:   Chief Complaint  Patient presents with  . Hypertension    Pt. is here for hypertension and COPD. Pt. stated she have to still do CAFA.    HPI Sabrina Holland 47 y.o. female presents to office today for hospital  follow up.   AonCRF with Hypoxemia/COPD She was admitted on 12/02/2017 and discharged on 12/04/2017 with acute on chronic respiratory failure with hypoxemia and COPD exacerbation.  She did continue his cocaine use prior to her hospital admission.  She was initially evaluated because of cough, shortness of breath, and wheezing and had to be placed on BiPAP due to tachypnea and hypoxemia.  She was treated with IV steroids, IV Levaquin, nebulizers and discharged home on p.o. Levaquin, steroid taper, Symbicort, Spiriva, and as needed albuterol  inhaler.  Today she endorses improved work of breathing and denies any chest pain, cough or wheezing. Sats 94% RA. After review of all of her medications she endorses medication compliance with some of her medications. She has not picked up her atorvastatin or her symbicort due to finances. She has been instructed to apply for the financial assistance program. She does continue to smoke and is not currently using the nicotine  patches that were prescribed for her.   Essential Hypertension Blood pressure is not well controlled today. She continues to smoke cigarettes. Endorses medication compliance. Denies chest pain, shortness of breath, palpitations, lightheadedness, dizziness, headaches or BLE edema.  BP Readings from Last 3 Encounters:  12/05/17 (!) 128/93  12/04/17 (!) 144/91  11/07/17 (!) 159/100     Review of Systems  Constitutional: Negative for fever, malaise/fatigue and weight loss.  HENT: Negative.  Negative for nosebleeds.   Eyes: Negative.  Negative for blurred vision, double vision and photophobia.  Respiratory: Negative.  Negative for cough and shortness of breath.   Cardiovascular: Negative.  Negative for chest pain, palpitations and leg swelling.  Gastrointestinal: Negative.  Negative for heartburn, nausea and vomiting.  Musculoskeletal: Negative.  Negative for myalgias.  Neurological: Negative.  Negative for dizziness, focal weakness, seizures and headaches.  Psychiatric/Behavioral: Negative.  Negative for suicidal ideas.    Past Medical History:  Diagnosis Date  . Breast cancer ([place])   . Breast cancer, stage 2 ([place]) 02/23/2011  . COPD (chronic obstructive pulmonary disease) ([place])   . Hypertension   . TIA (transient ischemic attack) 11/25/2011   "first time" (11/25/2011)    Past Surgical  History:  Procedure Laterality Date  . BREAST BIOPSY  2007   left  . MASTECTOMY  09/2005   left  . TONSILLECTOMY AND ADENOIDECTOMY     "when I was little" (11/25/2011)  . TUBAL  LIGATION  1993    Family History  Problem Relation Age of Onset  . COPD Father   . Sickle cell anemia Cousin        maternal cousin    Social History Reviewed with no changes to be made today.   Outpatient Medications Prior to Visit  Medication Sig Dispense Refill  . albuterol (PROVENTIL HFA) 108 (90 Base) MCG/ACT inhaler Inhale 1-2 puffs into the lungs every 6 (six) hours as needed for wheezing or shortness of breath. Use 2 puffs 3 times daily x 3 days, then every 6 hours as needed. 1 Inhaler 0  . amLODipine (NORVASC) 5 MG tablet Take 1 tablet (5 mg total) by mouth daily. 30 tablet 0  . aspirin EC 81 MG EC tablet Take 1 tablet (81 mg total) by mouth daily. 30 tablet 0  . benzonatate (TESSALON) 100 MG capsule Take 1 capsule (100 mg total) by mouth 3 (three) times daily as needed for cough. 20 capsule 0  . fluticasone (FLONASE) 50 MCG/ACT nasal spray Place 2 sprays into both nostrils daily. 16 g 0  . furosemide (LASIX) 20 MG tablet Take 1 tablet (20 mg total) by mouth daily. 30 tablet 0  . levofloxacin (LEVAQUIN) 500 MG tablet Take 1 tablet (500 mg total) by mouth daily for 3 days. 3 tablet 0  . lisinopril (PRINIVIL,ZESTRIL) 20 MG tablet Take 1 tablet (20 mg total) by mouth daily. 30 tablet 0  . loratadine (CLARITIN) 10 MG tablet Take 1 tablet (10 mg total) by mouth daily.    . Multiple Vitamin (MULTIVITAMIN WITH MINERALS) TABS tablet Take 1 tablet by mouth daily.    . predniSONE (DELTASONE) 20 MG tablet Take 1-3 tablets (20-60 mg total) by mouth daily with breakfast. Take 3 tablets (60)mg daily x 3 days, then 2 tablets (40mg ) daily x 3 days, then 1 tablet (20mg ) daily x 3 days then stop. 18 tablet 0  . spironolactone (ALDACTONE) 25 MG tablet Take 1 tablet (25 mg total) by mouth daily. 30 tablet 0  . tiotropium (SPIRIVA HANDIHALER) 18 MCG inhalation capsule Place 1 capsule (18 mcg total) into inhaler and inhale daily. 30 capsule 0  . atorvastatin (LIPITOR) 40 MG tablet Take 1 tablet (40 mg  total) by mouth daily at 6 PM. (Patient not taking: Reported on 12/05/2017) 30 tablet 0  . budesonide-formoterol (SYMBICORT) 80-4.5 MCG/ACT inhaler Inhale 2 puffs into the lungs 2 (two) times daily. (Patient not taking: Reported on 12/05/2017) 1 Inhaler 0  . famotidine (PEPCID) 20 MG tablet Take 1 tablet (20 mg total) by mouth 2 (two) times daily. (Patient not taking: Reported on 12/05/2017) 30 tablet 0  . nicotine (NICODERM CQ - DOSED IN MG/24 HOURS) 14 mg/24hr patch Place 1 patch (14 mg total) onto the skin daily. (Patient not taking: Reported on 12/05/2017) 28 patch 0   No facility-administered medications prior to visit.        Allergies  Allergen Reactions  . Penicillins Other (See Comments)    Pt had slight throat swelling Has patient had a PCN reaction causing immediate rash, facial/tongue/throat swelling, SOB or lightheadedness with hypotension: No Has patient had a PCN reaction causing severe rash involving mucus membranes or skin necrosis: No Has patient had a PCN reaction that  required hospitalization No Has patient had a PCN reaction occurring within the last 10 years: No If all of the above answers are "NO", then may proceed with Cephalosporin use.        Objective:    BP (!) 128/93 (BP Location: Right Arm, Patient Position: Sitting, Cuff Size: Normal)   Pulse (!) 112   Temp 98.2 F (36.8 C) (Oral)   Ht 5\' 5"  (1.651 m)   Wt 132 lb (59.9 kg)   LMP 02/18/2015 (Exact Date) Comment: MAYBE ONCE A YEAR  SpO2 94%   BMI 21.97 kg/m  Wt Readings from Last 3 Encounters:  12/05/17 132 lb (59.9 kg)  12/03/17 116 lb 13.5 oz (53 kg)  11/06/17 138 lb (62.6 kg)    Physical Exam  Constitutional: She is oriented to person, place, and time. She appears well-developed and well-nourished. She is cooperative.  HENT:  Head: Normocephalic and atraumatic.  Eyes: EOM are normal.  Neck: Normal range of motion.  Cardiovascular: Regular rhythm, normal heart sounds and intact distal  pulses. Tachycardia present. Exam reveals no gallop and no friction rub.  No murmur heard. Pulmonary/Chest: Effort normal and breath sounds normal. No tachypnea. No respiratory distress. She has no decreased breath sounds. She has no wheezes. She has no rhonchi. She has no rales. She exhibits no tenderness.  Abdominal: Soft. Bowel sounds are normal.  Musculoskeletal: Normal range of motion. She exhibits no edema.  Neurological: She is alert and oriented to person, place, and time. Coordination normal.  Skin: Skin is warm and dry.  Psychiatric: She has a normal mood and affect. Her behavior is normal. Judgment and thought content normal.  Nursing note and vitals reviewed.     Patient has been counseled extensively about nutrition and exercise as well as the importance of adherence with medications and regular follow-up. The patient was given clear instructions to go to ER or return to medical center if symptoms don't improve, worsen or new problems develop. The patient verbalized understanding.   Follow-up: Return in about 3 months (around 03/07/2018) for HTN.   Gildardo Pounds, FNP-BC [facility], [place]   12/11/2017, 6:01 PM

## 2017-12-11 ENCOUNTER — Encounter: Payer: Self-pay | Admitting: Nurse Practitioner

## 2017-12-19 ENCOUNTER — Ambulatory Visit (INDEPENDENT_AMBULATORY_CARE_PROVIDER_SITE_OTHER): Payer: Self-pay | Admitting: Cardiovascular Disease

## 2017-12-19 ENCOUNTER — Encounter: Payer: Self-pay | Admitting: Cardiovascular Disease

## 2017-12-19 VITALS — BP 132/80 | HR 101 | Ht 65.0 in | Wt 139.4 lb

## 2017-12-19 DIAGNOSIS — I5042 Chronic combined systolic (congestive) and diastolic (congestive) heart failure: Secondary | ICD-10-CM

## 2017-12-19 DIAGNOSIS — J441 Chronic obstructive pulmonary disease with (acute) exacerbation: Secondary | ICD-10-CM

## 2017-12-19 DIAGNOSIS — I10 Essential (primary) hypertension: Secondary | ICD-10-CM

## 2017-12-19 DIAGNOSIS — E782 Mixed hyperlipidemia: Secondary | ICD-10-CM

## 2017-12-19 DIAGNOSIS — F1410 Cocaine abuse, uncomplicated: Secondary | ICD-10-CM

## 2017-12-19 MED ORDER — ATORVASTATIN CALCIUM 40 MG PO TABS: 40.0000 mg | ORAL_TABLET | Freq: Every day | ORAL | 3 refills | Status: DC

## 2017-12-19 MED FILL — ATORVASTATIN CALCIUM 40 MG: 40 | 30 days supply | Qty: 30 | Fill #0

## 2017-12-19 NOTE — Patient Instructions (Signed)
Medication Instructions:  Dr Sallyanne Kuster has recommended making the following medication changes: 1. INCREASE Lisinopril to 40 mg daily  If you need a refill on your cardiac medications before your next appointment, please call your pharmacy.   Follow-Up: Your physician recommends that you schedule a follow-up appointment in 1 month with Kerin Ransom, PA.  Dr Sallyanne Kuster recommends that you schedule a follow-up appointment in 3 months.

## 2017-12-19 NOTE — Progress Notes (Signed)
Cardiology Office Note:    Date:  12/19/2017   ID:  Sabrina Holland, Sabrina Holland [birth year], MRN [id number]  PCP:  Gildardo Pounds, NP  Cardiologist:  Sanda Klein, MD  Electrophysiologist:  None   Referring MD: Gildardo Pounds, NP   Chief Complaint  Patient presents with  . Congestive Heart Failure    History of Present Illness:    Sabrina Holland is a 47 y.o. female with a hx of congestive heart failure with left ventricular systolic dysfunction,COPD, HTN, history of cocaine abuse, history of smoking, remote history of breast cancer (not treated with anthracyclines), recently hospitalized for acute on chronic respiratory failure with hypoxemia in mid November requiring temporary BiPAP.  She was treated with steroid taper and bronchodilators since the primary reason for her decompensation appears to be acute COPD exacerbation.  Her blood pressure was severely elevated on admission, but the chest x-ray did not show pulmonary edema.  The BNP was elevated at 1499, which was substantially higher than 456 just a few months earlier, but not quite as high as it had been at her initial presentation with heart failure June when it was 1844.  During her hospitalization she had a repeat echo still shows an ejection fraction of 25-30% with diffuse LV hypokinesis.  The PA pressure was estimated at 43 mmHg.  Really no change since June.  She is feeling better.  She has completed her steroid taper.  She is having a lot less problems with wheezing.  She was prescribed a couple of inhalers but did not have enough money to fill the prescriptions.  She denies leg edema, orthopnea, PND or shortness of breath with the.  Her weight has been stable.  Our office scale seems to overestimate her home scale by about 3 pounds.  The admission and discharge weights were not clearly documented during her recent hospitalization.  Reportedly her weight was as low as 116.8 pounds, but I do not believe this can be  accurate.  At her last 2 appointments in the office she weighed 133 and 135 pounds, respectively  She initially was putting on a brave face, but she started crying during the appointment.  She seemed generally distressed and is very worried about living.  She's been trying to take better care of herself but has significant social impediments.  She is homeless and depending on shelter from others.  She is still working as a Training and development officer at [employer].  Her initial application for Medicaid in June was benign but she applied again via the [facility] and is waiting to hear back.  She checks her blood pressure at the grocery store or drugstore.  She does have a scale and weighs daily.  She has cut back smoking to only 2 cigarettes a day.  She tells me that she has only used cocaine about 3 times since September.  Back in June she was using it daily.  It sounds like she uses it as self-medication for problems with anxiety and depression.  Her urine drug screen on admission on November 17 was positive for cocaine and THC, but not for opiates.  Past Medical History:  Diagnosis Date  . Breast cancer ([place])   . Breast cancer, stage 2 ([place]) 02/23/2011  . COPD (chronic obstructive pulmonary disease) ([place])   . Hypertension   . TIA (transient ischemic attack) 11/25/2011   "first time" (11/25/2011)    Past Surgical History:  Procedure Laterality Date  . BREAST BIOPSY  2007  left  . MASTECTOMY  09/2005   left  . TONSILLECTOMY AND ADENOIDECTOMY     "when I was little" (11/25/2011)  . TUBAL LIGATION  1993    Current Medications: Current Meds  Medication Sig  . albuterol (PROVENTIL HFA) 108 (90 Base) MCG/ACT inhaler Inhale 1-2 puffs into the lungs every 6 (six) hours as needed for wheezing or shortness of breath. Use 2 puffs 3 times daily x 3 days, then every 6 hours as needed.  Marland Kitchen amLODipine (NORVASC) 5 MG tablet Take 1 tablet (5 mg total) by mouth daily.  Marland Kitchen aspirin EC 81 MG EC tablet Take 1 tablet (81 mg  total) by mouth daily.  . benzonatate (TESSALON) 100 MG capsule Take 1 capsule (100 mg total) by mouth 3 (three) times daily as needed for cough.  . fluticasone (FLONASE) 50 MCG/ACT nasal spray Place 2 sprays into both nostrils daily.  . furosemide (LASIX) 20 MG tablet Take 1 tablet (20 mg total) by mouth daily.  Marland Kitchen lisinopril (PRINIVIL,ZESTRIL) 20 MG tablet Take 1 tablet (20 mg total) by mouth daily.  . Multiple Vitamin (MULTIVITAMIN WITH MINERALS) TABS tablet Take 1 tablet by mouth daily.  . predniSONE (DELTASONE) 20 MG tablet Take 1-3 tablets (20-60 mg total) by mouth daily with breakfast. Take 3 tablets (60)mg daily x 3 days, then 2 tablets (40mg ) daily x 3 days, then 1 tablet (20mg ) daily x 3 days then stop.  Marland Kitchen spironolactone (ALDACTONE) 25 MG tablet Take 1 tablet (25 mg total) by mouth daily.  Marland Kitchen tiotropium (SPIRIVA HANDIHALER) 18 MCG inhalation capsule Place 1 capsule (18 mcg total) into inhaler and inhale daily.     Allergies:   Penicillins   Social History   Socioeconomic History  . Marital status: Single    Spouse name: Not on file  . Number of children: 4  . Years of education: Not on file  . Highest education level: Not on file  Occupational History    Employer: [employer]  Social Needs  . Financial resource strain: Not on file  . Food insecurity:    Worry: Not on file    Inability: Not on file  . Transportation needs:    Medical: Not on file    Non-medical: Not on file  Tobacco Use  . Smoking status: Current Some Day Smoker    Packs/day: 0.00    Years: 20.00    Pack years: 0.00    Types: Cigarettes  . Smokeless tobacco: Never Used  . Tobacco comment: 1 or 2 cigarettes a day- approx 1 pack week  Substance and Sexual Activity  . Alcohol use: Yes    Alcohol/week: 12.0 standard drinks    Types: 12 Cans of beer per week    Comment: occasionally  . Drug use: Yes    Types: Marijuana, Cocaine    Comment: patient states she uses cocaine and marijuana daily  . Sexual  activity: Not Currently    Birth control/protection: Surgical  Lifestyle  . Physical activity:    Days per week: Not on file    Minutes per session: Not on file  . Stress: Not on file  Relationships  . Social connections:    Talks on phone: Not on file    Gets together: Not on file    Attends religious service: Not on file    Active member of club or organization: Not on file    Attends meetings of clubs or organizations: Not on file    Relationship status: Not on  file  Other Topics Concern  . Not on file  Social History Narrative   ** Merged History Encounter **         Family History: The patient's family history includes COPD in her father; Sickle cell anemia in her cousin.  ROS:   Please see the history of present illness.     All other systems reviewed and are negative.  EKGs/Labs/Other Studies Reviewed:    The following studies were reviewed today: Extensive review of notes from recent hospitalization, reviewed last echo  EKG:  EKG is not ordered today.  The ekg ordered today demonstrates sinus rhythm, biatrial enlargement, LVH  Recent Labs: 12/02/2017: B Natriuretic Peptide 1,499.0; Magnesium 2.1 12/03/2017: ALT 23; TSH <0.010 12/04/2017: BUN 12; Creatinine, Ser 0.70; Hemoglobin 11.3; Platelets 327; Potassium 4.3; Sodium 138  Recent Lipid Panel    Component Value Date/Time   CHOL 185 06/21/2017 0519   TRIG 59 06/21/2017 0519   HDL 93 06/21/2017 0519   CHOLHDL 2.0 06/21/2017 0519   VLDL 12 06/21/2017 0519   LDLCALC 80 06/21/2017 0519    Physical Exam:    VS:  BP 132/80   Pulse (!) 101   Ht 5\' 5"  (1.651 m)   Wt 139 lb 6.4 oz (63.2 kg)   LMP 02/18/2015 (Exact Date) Comment: MAYBE ONCE A YEAR  BMI 23.20 kg/m      Wt Readings from Last 3 Encounters:  12/19/17 139 lb 6.4 oz (63.2 kg)  12/05/17 132 lb (59.9 kg)  12/03/17 116 lb 13.5 oz (53 kg)     GEN: Lean, well nourished, well developed in no acute distress HEENT: Normal NECK: No JVD; No carotid  bruits LYMPHATICS: No lymphadenopathy CARDIAC: RRR, mildly tachycardic, no murmurs, rubs, gallops RESPIRATORY: Diminished breath sounds and few wheezes bilaterally ABDOMEN: Soft, non-tender, non-distended MUSCULOSKELETAL:  No edema; No deformity  SKIN: Warm and dry NEUROLOGIC:  Alert and oriented x 3 PSYCHIATRIC:  Normal affect   ASSESSMENT:    1. Chronic combined systolic and diastolic heart failure ([place])   2. Essential hypertension   3. Chronic obstructive pulmonary disease with acute exacerbation ([place])   4. Mixed hyperlipidemia   5. Cocaine abuse ([place])    PLAN:    In order of problems listed above:  1. CHF: There are no findings of overt hypervolemia on physical exam.  I will continue the same dose of diuretics.  Increase lisinopril to 40 mg daily.  I explained to her that there are better ways of treating heart failure, but that until she proves abstinence from cocaine it is not safe to start of those medications.  I would recommend a urine drug screen when she comes back for her appointment in 1 month.  If this is negative I would start carvedilol very low-dose 3.125 mg twice daily.  We discussed the negative impact that cocaine has on heart function, even if it is not the primary reason for her cardiomyopathy.  While it is possible that she has underlying ischemic cardiomyopathy, angina has never been a complaint, there are no repolarization abnormalities or signs of previous infarction she has global hypokinesis.  Her only risk factor for coronary disease is smoking.  At some point, cardiac catheterization would be useful, but I think it makes little sense until she proves better compliance with therapy. 2. HTN: Fair control but would like to consistently keep her sblood pressure in the 110s/70s if possible.  Increase lisinopril today. 3. COPD: On exam today she has findings consistent  with COPD.  Encourage her to continue efforts to quit smoking completely and permanently.  I do not  think she will be able to afford the use of Symbicort or Spiriva.  She should use the albuterol as a rescue inhaler.  No longer on steroids. 4. HLP: satisfactory lipid profile on statin unless we identify coronary/vascular disease in which case the target LDL cholesterol would be less than 70. 5. Cocaine abuse: This is the biggest threat to her health and the most likely underlying cause for her cardiomyopathy.  Stressed the importance of complete abstinence from cocaine which will become even more important if we stop treatment beta-blockers.  She seems generally interested in cocaine abstention.  She has been to rehab before.  She needs to keep her job at [employer], which is her only source of income.   Medication Adjustments/Labs and Tests Ordered: Current medicines are reviewed at length with the patient today.  Concerns regarding medicines are outlined above.  No orders of the defined types were placed in this encounter.  Meds ordered this encounter  Medications  . DISCONTD: atorvastatin (LIPITOR) 40 MG tablet    Sig: Take 1 tablet (40 mg total) by mouth daily at 6 PM.    Dispense:  90 tablet    Refill:  3  . atorvastatin (LIPITOR) 40 MG tablet    Sig: Take 1 tablet (40 mg total) by mouth daily at 6 PM.    Dispense:  90 tablet    Refill:  3    Patient Instructions  Medication Instructions:  Dr Sallyanne Kuster has recommended making the following medication changes: 1. INCREASE Lisinopril to 40 mg daily  If you need a refill on your cardiac medications before your next appointment, please call your pharmacy.   Follow-Up: Your physician recommends that you schedule a follow-up appointment in 1 month with Kerin Ransom, PA.  Dr Sallyanne Kuster recommends that you schedule a follow-up appointment in 3 months.    Signed, Sanda Klein, MD  12/19/2017 3:02 PM    [facility]

## 2018-01-05 MED FILL — SPIRIVA 18 MCG CP-HANDIHALE: 18 | 30 days supply | Qty: 30 | Fill #0

## 2018-01-05 MED FILL — predniSONE 20 MG TABS: 20 | 9 days supply | Qty: 18 | Fill #0

## 2018-01-05 MED FILL — ATORVASTATIN CALCIUM 40 MG: 40 | 30 days supply | Qty: 30 | Fill #0

## 2018-01-05 MED FILL — SYMBICORT 80-4.5 MCG INH: 80-4.5 | 30 days supply | Qty: 10 | Fill #0

## 2018-01-05 MED FILL — AMLODIPINE BESYLATE 5 MG TA: 5 | 30 days supply | Qty: 30 | Fill #0

## 2018-01-19 ENCOUNTER — Encounter (HOSPITAL_COMMUNITY): Payer: Self-pay

## 2018-01-19 ENCOUNTER — Other Ambulatory Visit: Payer: Self-pay

## 2018-01-19 ENCOUNTER — Emergency Department (HOSPITAL_COMMUNITY)
Admission: EM | Admit: 2018-01-19 | Discharge: 2018-01-19 | Discharge status: Against Medical Advice | Payer: Self-pay | Attending: Emergency Medicine | Admitting: Emergency Medicine

## 2018-01-19 ENCOUNTER — Encounter (HOSPITAL_COMMUNITY): Payer: Self-pay | Admitting: *Deleted

## 2018-01-19 ENCOUNTER — Emergency Department (HOSPITAL_COMMUNITY): Payer: Self-pay

## 2018-01-19 DIAGNOSIS — Z5321 Procedure and treatment not carried out due to patient leaving prior to being seen by health care provider: Secondary | ICD-10-CM | POA: Insufficient documentation

## 2018-01-19 DIAGNOSIS — Z9119 Patient's noncompliance with other medical treatment and regimen: Secondary | ICD-10-CM

## 2018-01-19 DIAGNOSIS — Z853 Personal history of malignant neoplasm of breast: Secondary | ICD-10-CM

## 2018-01-19 DIAGNOSIS — R0602 Shortness of breath: Secondary | ICD-10-CM | POA: Insufficient documentation

## 2018-01-19 DIAGNOSIS — Z8673 Personal history of transient ischemic attack (TIA), and cerebral infarction without residual deficits: Secondary | ICD-10-CM

## 2018-01-19 DIAGNOSIS — J449 Chronic obstructive pulmonary disease, unspecified: Secondary | ICD-10-CM | POA: Diagnosis present

## 2018-01-19 DIAGNOSIS — Z825 Family history of asthma and other chronic lower respiratory diseases: Secondary | ICD-10-CM

## 2018-01-19 DIAGNOSIS — Z7982 Long term (current) use of aspirin: Secondary | ICD-10-CM

## 2018-01-19 DIAGNOSIS — R05 Cough: Secondary | ICD-10-CM | POA: Insufficient documentation

## 2018-01-19 DIAGNOSIS — F17200 Nicotine dependence, unspecified, uncomplicated: Secondary | ICD-10-CM | POA: Diagnosis present

## 2018-01-19 DIAGNOSIS — Z9851 Tubal ligation status: Secondary | ICD-10-CM

## 2018-01-19 DIAGNOSIS — Z7951 Long term (current) use of inhaled steroids: Secondary | ICD-10-CM

## 2018-01-19 DIAGNOSIS — Z832 Family history of diseases of the blood and blood-forming organs and certain disorders involving the immune mechanism: Secondary | ICD-10-CM

## 2018-01-19 DIAGNOSIS — I5043 Acute on chronic combined systolic (congestive) and diastolic (congestive) heart failure: Secondary | ICD-10-CM | POA: Diagnosis present

## 2018-01-19 DIAGNOSIS — I110 Hypertensive heart disease with heart failure: Principal | ICD-10-CM | POA: Diagnosis present

## 2018-01-19 DIAGNOSIS — Z880 Allergy status to penicillin: Secondary | ICD-10-CM

## 2018-01-19 DIAGNOSIS — F1410 Cocaine abuse, uncomplicated: Secondary | ICD-10-CM | POA: Diagnosis present

## 2018-01-19 DIAGNOSIS — Z79899 Other long term (current) drug therapy: Secondary | ICD-10-CM

## 2018-01-19 DIAGNOSIS — Z9012 Acquired absence of left breast and nipple: Secondary | ICD-10-CM

## 2018-01-19 MED ORDER — ALBUTEROL SULFATE (2.5 MG/3ML) 0.083% IN NEBU
5.0000 mg | INHALATION_SOLUTION | Freq: Once | RESPIRATORY_TRACT | Status: AC
  Administered 2018-01-19: 5 mg via RESPIRATORY_TRACT
  Filled 2018-01-19: qty 6

## 2018-01-19 NOTE — ED Triage Notes (Signed)
Pt in c/o shortness of breath that started yesterday after her inhaler ran out at home, reports mild cough but it is not productive

## 2018-01-19 NOTE — ED Triage Notes (Signed)
Pt discharged earlier today at [facility]. She reports continued SOB. Lungs are clear. 95%RA.

## 2018-01-20 ENCOUNTER — Inpatient Hospital Stay (HOSPITAL_COMMUNITY)
Admission: EM | Admit: 2018-01-20 | Discharge: 2018-01-21 | DRG: 293 | Disposition: A | Discharge status: Home / Self Care | Payer: Self-pay | Attending: Family Medicine | Admitting: Family Medicine

## 2018-01-20 ENCOUNTER — Encounter (HOSPITAL_COMMUNITY): Payer: Self-pay | Admitting: Family Medicine

## 2018-01-20 DIAGNOSIS — F1910 Other psychoactive substance abuse, uncomplicated: Secondary | ICD-10-CM | POA: Diagnosis present

## 2018-01-20 DIAGNOSIS — I10 Essential (primary) hypertension: Secondary | ICD-10-CM | POA: Diagnosis present

## 2018-01-20 DIAGNOSIS — I509 Heart failure, unspecified: Secondary | ICD-10-CM

## 2018-01-20 DIAGNOSIS — J449 Chronic obstructive pulmonary disease, unspecified: Secondary | ICD-10-CM | POA: Diagnosis present

## 2018-01-20 DIAGNOSIS — I5043 Acute on chronic combined systolic (congestive) and diastolic (congestive) heart failure: Secondary | ICD-10-CM | POA: Diagnosis present

## 2018-01-20 DIAGNOSIS — I5023 Acute on chronic systolic (congestive) heart failure: Secondary | ICD-10-CM | POA: Diagnosis present

## 2018-01-20 DIAGNOSIS — F1410 Cocaine abuse, uncomplicated: Secondary | ICD-10-CM | POA: Diagnosis present

## 2018-01-20 LAB — URINALYSIS, MICROSCOPIC (REFLEX)

## 2018-01-20 LAB — URINALYSIS, ROUTINE W REFLEX MICROSCOPIC
Bilirubin Urine: NEGATIVE
Glucose, UA: NEGATIVE mg/dL
Hgb urine dipstick: NEGATIVE
Ketones, ur: NEGATIVE mg/dL
Leukocytes, UA: NEGATIVE
Nitrite: NEGATIVE
Specific Gravity, Urine: >1.03 — ABNORMAL HIGH (ref 1.005–1.030)
pH: 5.5 (ref 5.0–8.0)

## 2018-01-20 LAB — CBC WITH DIFFERENTIAL/PLATELET
Abs Immature Granulocytes: 0.01 10*3/uL (ref 0.00–0.07)
Basophils Absolute: 0.1 10*3/uL (ref 0.0–0.1)
Basophils Relative: 1 %
Eosinophils Absolute: 0.3 10*3/uL (ref 0.0–0.5)
Eosinophils Relative: 5 %
HCT: 36.9 % (ref 36.0–46.0)
Hemoglobin: 11.3 g/dL — ABNORMAL LOW (ref 12.0–15.0)
Immature Granulocytes: 0 %
Lymphocytes Relative: 36 %
Lymphs Abs: 2.4 10*3/uL (ref 0.7–4.0)
MCH: 27 pg (ref 26.0–34.0)
MCHC: 30.6 g/dL (ref 30.0–36.0)
MCV: 88.1 fL (ref 80.0–100.0)
Monocytes Absolute: 0.7 10*3/uL (ref 0.1–1.0)
Monocytes Relative: 11 %
Neutro Abs: 3.1 10*3/uL (ref 1.7–7.7)
Neutrophils Relative %: 47 %
Platelets: 400 10*3/uL (ref 150–400)
RBC: 4.19 MIL/uL (ref 3.87–5.11)
RDW: 15.5 % (ref 11.5–15.5)
WBC: 6.5 10*3/uL (ref 4.0–10.5)
nRBC: 0 % (ref 0.0–0.2)

## 2018-01-20 LAB — BASIC METABOLIC PANEL
Anion gap: 9 (ref 5–15)
BUN: 14 mg/dL (ref 6–20)
CO2: 27 mmol/L (ref 22–32)
Calcium: 9.1 mg/dL (ref 8.9–10.3)
Chloride: 105 mmol/L (ref 98–111)
Creatinine, Ser: 0.9 mg/dL (ref 0.44–1.00)
GFR calc Af Amer: >60 mL/min (ref >60)
GFR calc non Af Amer: >60 mL/min (ref >60)
Glucose, Bld: 140 mg/dL — ABNORMAL HIGH (ref 70–99)
Potassium: 4.2 mmol/L (ref 3.5–5.1)
Sodium: 141 mmol/L (ref 135–145)

## 2018-01-20 LAB — RAPID URINE DRUG SCREEN, HOSP PERFORMED
Amphetamines: NOT DETECTED
Barbiturates: NOT DETECTED
Benzodiazepines: NOT DETECTED
Cocaine: POSITIVE — AB
Opiates: NOT DETECTED
Tetrahydrocannabinol: POSITIVE — AB

## 2018-01-20 LAB — TROPONIN I
Troponin I: 0.03 ng/mL (ref <0.03)
Troponin I: 0.03 ng/mL (ref <0.03)

## 2018-01-20 LAB — BRAIN NATRIURETIC PEPTIDE: B Natriuretic Peptide: 2017.6 pg/mL — ABNORMAL HIGH (ref 0.0–100.0)

## 2018-01-20 MED ORDER — LISINOPRIL 20 MG PO TABS
20.0000 mg | ORAL_TABLET | Freq: Every day | ORAL | Status: DC
  Administered 2018-01-20: 20 mg via ORAL
  Filled 2018-01-20: qty 1

## 2018-01-20 MED ORDER — AMLODIPINE BESYLATE 5 MG PO TABS
5.0000 mg | ORAL_TABLET | Freq: Every day | ORAL | Status: DC
  Administered 2018-01-21: 5 mg via ORAL
  Filled 2018-01-20: qty 1

## 2018-01-20 MED ORDER — UMECLIDINIUM BROMIDE 62.5 MCG/INH IN AEPB
1.0000 | INHALATION_SPRAY | Freq: Every day | RESPIRATORY_TRACT | Status: DC
  Administered 2018-01-20 – 2018-01-21 (×2): 1 via RESPIRATORY_TRACT
  Filled 2018-01-20: qty 7

## 2018-01-20 MED ORDER — FUROSEMIDE 10 MG/ML IJ SOLN
60.0000 mg | Freq: Once | INTRAMUSCULAR | Status: AC
  Administered 2018-01-20: 60 mg via INTRAVENOUS
  Filled 2018-01-20: qty 8

## 2018-01-20 MED ORDER — FAMOTIDINE 20 MG PO TABS
20.0000 mg | ORAL_TABLET | Freq: Two times a day (BID) | ORAL | Status: DC
  Administered 2018-01-20 – 2018-01-21 (×3): 20 mg via ORAL
  Filled 2018-01-20 (×3): qty 1

## 2018-01-20 MED ORDER — ATORVASTATIN CALCIUM 40 MG PO TABS
40.0000 mg | ORAL_TABLET | Freq: Every day | ORAL | Status: DC
  Administered 2018-01-20: 40 mg via ORAL
  Filled 2018-01-20: qty 1

## 2018-01-20 MED ORDER — ACETAMINOPHEN 650 MG RE SUPP: 650.0000 mg | Freq: Four times a day (QID) | RECTAL | Status: DC | PRN

## 2018-01-20 MED ORDER — ONDANSETRON HCL 4 MG/2ML IJ SOLN: 4.0000 mg | Freq: Four times a day (QID) | INTRAMUSCULAR | Status: DC | PRN

## 2018-01-20 MED ORDER — LISINOPRIL 20 MG PO TABS
40.0000 mg | ORAL_TABLET | Freq: Every day | ORAL | Status: DC
  Administered 2018-01-21: 40 mg via ORAL
  Filled 2018-01-20: qty 2

## 2018-01-20 MED ORDER — ACETAMINOPHEN 325 MG PO TABS
650.0000 mg | ORAL_TABLET | Freq: Four times a day (QID) | ORAL | Status: DC | PRN
  Administered 2018-01-20: 650 mg via ORAL
  Filled 2018-01-20: qty 2

## 2018-01-20 MED ORDER — FUROSEMIDE 10 MG/ML IJ SOLN
40.0000 mg | Freq: Once | INTRAMUSCULAR | Status: AC
  Administered 2018-01-20: 40 mg via INTRAVENOUS
  Filled 2018-01-20: qty 4

## 2018-01-20 MED ORDER — SPIRONOLACTONE 25 MG PO TABS
25.0000 mg | ORAL_TABLET | Freq: Every day | ORAL | Status: DC
  Administered 2018-01-20 – 2018-01-21 (×2): 25 mg via ORAL
  Filled 2018-01-20 (×2): qty 1

## 2018-01-20 MED ORDER — TIOTROPIUM BROMIDE MONOHYDRATE 18 MCG IN CAPS: 18.0000 ug | ORAL_CAPSULE | Freq: Every day | RESPIRATORY_TRACT | Status: DC

## 2018-01-20 MED ORDER — POTASSIUM CHLORIDE CRYS ER 20 MEQ PO TBCR
40.0000 meq | EXTENDED_RELEASE_TABLET | Freq: Once | ORAL | Status: AC
  Administered 2018-01-20: 40 meq via ORAL
  Filled 2018-01-20: qty 2

## 2018-01-20 MED ORDER — FUROSEMIDE 10 MG/ML IJ SOLN
80.0000 mg | Freq: Two times a day (BID) | INTRAMUSCULAR | Status: DC
  Administered 2018-01-20 – 2018-01-21 (×2): 80 mg via INTRAVENOUS
  Filled 2018-01-20 (×2): qty 8

## 2018-01-20 MED ORDER — METHOCARBAMOL 500 MG PO TABS
750.0000 mg | ORAL_TABLET | Freq: Three times a day (TID) | ORAL | Status: DC | PRN
  Administered 2018-01-20 – 2018-01-21 (×2): 750 mg via ORAL
  Filled 2018-01-20 (×2): qty 2

## 2018-01-20 MED ORDER — MOMETASONE FURO-FORMOTEROL FUM 100-5 MCG/ACT IN AERO
2.0000 | INHALATION_SPRAY | Freq: Two times a day (BID) | RESPIRATORY_TRACT | Status: DC
  Administered 2018-01-20 – 2018-01-21 (×2): 2 via RESPIRATORY_TRACT
  Filled 2018-01-20: qty 8.8

## 2018-01-20 MED ORDER — POTASSIUM CHLORIDE CRYS ER 20 MEQ PO TBCR
20.0000 meq | EXTENDED_RELEASE_TABLET | Freq: Two times a day (BID) | ORAL | Status: DC
  Administered 2018-01-20 – 2018-01-21 (×3): 20 meq via ORAL
  Filled 2018-01-20 (×3): qty 1

## 2018-01-20 MED ORDER — ONDANSETRON HCL 4 MG PO TABS: 4.0000 mg | ORAL_TABLET | Freq: Four times a day (QID) | ORAL | Status: DC | PRN

## 2018-01-20 MED ORDER — ENOXAPARIN SODIUM 40 MG/0.4ML ~~LOC~~ SOLN
40.0000 mg | SUBCUTANEOUS | Status: DC
  Administered 2018-01-20 – 2018-01-21 (×2): 40 mg via SUBCUTANEOUS
  Filled 2018-01-20 (×2): qty 0.4

## 2018-01-20 MED ORDER — NICOTINE 14 MG/24HR TD PT24
14.0000 mg | MEDICATED_PATCH | Freq: Every day | TRANSDERMAL | Status: DC
  Administered 2018-01-20 – 2018-01-21 (×2): 14 mg via TRANSDERMAL
  Filled 2018-01-20 (×2): qty 1

## 2018-01-20 MED ORDER — INFLUENZA VAC SPLIT QUAD 0.5 ML IM SUSY
0.5000 mL | PREFILLED_SYRINGE | INTRAMUSCULAR | Status: DC
  Filled 2018-01-20: qty 0.5

## 2018-01-20 MED ORDER — ASPIRIN EC 81 MG PO TBEC
81.0000 mg | DELAYED_RELEASE_TABLET | Freq: Every day | ORAL | Status: DC
  Administered 2018-01-20 – 2018-01-21 (×2): 81 mg via ORAL
  Filled 2018-01-20 (×2): qty 1

## 2018-01-20 MED ORDER — ALBUTEROL SULFATE (2.5 MG/3ML) 0.083% IN NEBU: 2.5000 mg | INHALATION_SOLUTION | RESPIRATORY_TRACT | Status: DC | PRN

## 2018-01-20 NOTE — ED Notes (Signed)
Pt ambulated to bathroom with no difficulty 

## 2018-01-20 NOTE — ED Notes (Signed)
Pt asks "What have I got to do fall in the floor to get a room."

## 2018-01-20 NOTE — ED Notes (Signed)
Pt allowed to eat per MD.

## 2018-01-20 NOTE — ED Notes (Addendum)
Pt states that she thinks she need oxygen. Vitals to be checked. No wheezing or obvious distress noted.

## 2018-01-20 NOTE — ED Notes (Signed)
Bed: WTR8 Expected date:  Expected time:  Means of arrival:  Comments: 

## 2018-01-20 NOTE — ED Provider Notes (Signed)
[facility] DEPT Provider Note   CSN: [id number] Arrival date & time: 01/19/18  2209     History   Chief Complaint Chief Complaint  Patient presents with  . Shortness of Breath    HPI Sabrina Holland is a 48 y.o. female.  HPI   48 year old female with shortness of breath.  Onset 2 days ago.  Persistent since then.  Feels short of breath with minimal activity.  Orthopnea.  She feels like she is having some swelling around her stomach.  She does have some swelling in her legs as well but she states that this worsens throughout the day and is better in the morning.  Occasional nonproductive cough.  No chest pain.  Past Medical History:  Diagnosis Date  . Breast cancer ([place])   . Breast cancer, stage 2 ([place]) 02/23/2011  . COPD (chronic obstructive pulmonary disease) ([place])   . Hypertension   . TIA (transient ischemic attack) 11/25/2011   "first time" (11/25/2011)    Patient Active Problem List   Diagnosis Date Noted  . Cocaine abuse ([place]) 12/19/2017  . Mixed hyperlipidemia 12/19/2017  . COPD exacerbation ([place])   . Acute on chronic respiratory failure with hypoxemia ([place]) 12/03/2017  . Cardiomyopathy ([place]) 07/04/2017  . COPD (chronic obstructive pulmonary disease) ([place])   . Chronic combined systolic and diastolic heart failure ([place])   . Anemia 06/20/2017  . Polysubstance abuse ([place]) 11/26/2011  . TIA (transient ischemic attack) 11/25/2011  . Essential hypertension 11/25/2011  . Sinusitis 11/25/2011  . Vitamin B 12 deficiency 07/18/2011  . History of breast cancer 02/23/2011    Past Surgical History:  Procedure Laterality Date  . BREAST BIOPSY  2007   left  . MASTECTOMY  09/2005   left  . TONSILLECTOMY AND ADENOIDECTOMY     "when I was little" (11/25/2011)  . TUBAL LIGATION  1993     OB History    Gravida  5   Para  4   Term  4   Preterm  0   AB  1   Living        SAB  1   TAB  0   Ectopic  0   Multiple      Live Births               Home Medications    Prior to Admission medications   Medication Sig Start Date End Date Taking? Authorizing Provider  albuterol (PROVENTIL HFA) 108 (90 Base) MCG/ACT inhaler Inhale 1-2 puffs into the lungs every 6 (six) hours as needed for wheezing or shortness of breath. Use 2 puffs 3 times daily x 3 days, then every 6 hours as needed. 12/04/17   Eugenie Filler, MD  amLODipine (NORVASC) 5 MG tablet Take 1 tablet (5 mg total) by mouth daily. 12/04/17   Eugenie Filler, MD  aspirin EC 81 MG EC tablet Take 1 tablet (81 mg total) by mouth daily. 06/24/17   Reyne Dumas, MD  atorvastatin (LIPITOR) 40 MG tablet Take 1 tablet (40 mg total) by mouth daily at 6 PM. 12/19/17   Croitoru, Dani Gobble, MD  benzonatate (TESSALON) 100 MG capsule Take 1 capsule (100 mg total) by mouth 3 (three) times daily as needed for cough. 12/04/17   Eugenie Filler, MD  budesonide-formoterol [facility]) 80-4.5 MCG/ACT inhaler Inhale 2 puffs into the lungs 2 (two) times daily. Patient not taking: Reported on 12/05/2017 12/04/17   Eugenie Filler, MD  famotidine (PEPCID)  20 MG tablet Take 1 tablet (20 mg total) by mouth 2 (two) times daily. Patient not taking: Reported on 12/05/2017 12/04/17   Eugenie Filler, MD  fluticasone [facility]) 50 MCG/ACT nasal spray Place 2 sprays into both nostrils daily. 12/05/17   Eugenie Filler, MD  furosemide (LASIX) 20 MG tablet Take 1 tablet (20 mg total) by mouth daily. 12/05/17   Eugenie Filler, MD  lisinopril (PRINIVIL,ZESTRIL) 20 MG tablet Take 1 tablet (20 mg total) by mouth daily. 12/05/17   Eugenie Filler, MD  Multiple Vitamin (MULTIVITAMIN WITH MINERALS) TABS tablet Take 1 tablet by mouth daily.    [provider]  predniSONE (DELTASONE) 20 MG tablet Take 1-3 tablets (20-60 mg total) by mouth daily with breakfast. Take 3 tablets (60)mg daily x 3 days, then 2 tablets (40mg ) daily x 3 days, then 1 tablet (20mg ) daily x 3 days  then stop. 12/04/17   Eugenie Filler, MD  spironolactone (ALDACTONE) 25 MG tablet Take 1 tablet (25 mg total) by mouth daily. 12/04/17   Eugenie Filler, MD  tiotropium (SPIRIVA HANDIHALER) 18 MCG inhalation capsule Place 1 capsule (18 mcg total) into inhaler and inhale daily. 12/04/17 12/04/18  Eugenie Filler, MD    Family History Family History  Problem Relation Age of Onset  . COPD Father   . Sickle cell anemia Cousin        maternal cousin    Social History Social History   Tobacco Use  . Smoking status: Current Some Day Smoker    Packs/day: 0.00    Years: 20.00    Pack years: 0.00    Types: Cigarettes  . Smokeless tobacco: Never Used  . Tobacco comment: 1 or 2 cigarettes a day- approx 1 pack week  Substance Use Topics  . Alcohol use: Yes    Alcohol/week: 12.0 standard drinks    Types: 12 Cans of beer per week    Comment: occasionally  . Drug use: Yes    Types: Marijuana, Cocaine    Comment: patient states she uses cocaine and marijuana daily     Allergies   Penicillins   Review of Systems Review of Systems  All systems reviewed and negative, other than as noted in HPI.  Physical Exam Updated Vital Signs BP (!) 139/109 (BP Location: Right Arm)   Pulse (!) 118   Temp 97.7 F (36.5 C) (Oral)   Resp (!) 24   LMP 02/18/2015 (Exact Date) Comment: MAYBE ONCE A YEAR  SpO2 97%   Physical Exam Vitals signs and nursing note reviewed.  Constitutional:      General: She is not in acute distress.    Appearance: She is well-developed.  HENT:     Head: Normocephalic and atraumatic.  Eyes:     General:        Right eye: No discharge.        Left eye: No discharge.     Conjunctiva/sclera: Conjunctivae normal.  Neck:     Musculoskeletal: Neck supple.  Cardiovascular:     Rate and Rhythm: Regular rhythm. Tachycardia present.     Heart sounds: Normal heart sounds. No murmur. No friction rub. No gallop.   Pulmonary:     Effort: Tachypnea present.    Abdominal:     General: There is no distension.     Palpations: Abdomen is soft.     Tenderness: There is no abdominal tenderness.  Musculoskeletal:        General: No tenderness.  Skin:    General: Skin is warm and dry.  Neurological:     Mental Status: She is alert.  Psychiatric:        Behavior: Behavior normal.        Thought Content: Thought content normal.      ED Treatments / Results  Labs (all labs ordered are listed, but only abnormal results are displayed) Labs Reviewed  BASIC METABOLIC PANEL - Abnormal; Notable for the following components:      Result Value   Glucose, Bld 140 (*)    All other components within normal limits  BRAIN NATRIURETIC PEPTIDE - Abnormal; Notable for the following components:   B Natriuretic Peptide 2,017.6 (*)    All other components within normal limits  CBC WITH DIFFERENTIAL/PLATELET - Abnormal; Notable for the following components:   Hemoglobin 11.3 (*)    All other components within normal limits  TROPONIN I - Abnormal; Notable for the following components:   Troponin I 0.03 (*)    All other components within normal limits  RAPID URINE DRUG SCREEN, HOSP PERFORMED - Abnormal; Notable for the following components:   Cocaine POSITIVE (*)    Tetrahydrocannabinol POSITIVE (*)    All other components within normal limits  URINALYSIS, ROUTINE W REFLEX MICROSCOPIC    EKG EKG Interpretation  Date/Time:  Saturday January 20 2018 04:35:59 EST Ventricular Rate:  115 PR Interval:    QRS Duration: 83 QT Interval:  347 QTC Calculation: 480 R Axis:   60 Text Interpretation:  Sinus tachycardia Abnormal R-wave progression, late transition Left ventricular hypertrophy Nonspecific T abnormalities, lateral leads Minimal ST elevation, anterolateral leads Confirmed by Virgel Manifold [phone]) on 01/20/2018 5:17:48 AM   Radiology Dg Chest 2 View  Result Date: 01/19/2018 CLINICAL DATA:  Shortness of breath. EXAM: CHEST - 2 VIEW COMPARISON:  Chest  x-ray dated December 02, 2017. FINDINGS: Stable cardiomegaly. New increased interstitial thickening with Kerley B-lines. Lungs remain hyperinflated. New patchy opacity in the medial right lower lobe. No pleural effusion or pneumothorax. No acute osseous abnormality. IMPRESSION: 1. Stable cardiomegaly with new mild interstitial pulmonary edema. 2. New medial right lower lobe atelectasis versus infiltrate. 3. COPD. Electronically Signed   By: Titus Dubin M.D.   On: 01/19/2018 12:40    Procedures Procedures (including critical care time)  Medications Ordered in ED Medications - No data to display   Initial Impression / Assessment and Plan / ED Course  I have reviewed the triage vital signs and the nursing notes.  Pertinent labs & imaging results that were available during my care of the patient were reviewed by me and considered in my medical decision making (see chart for details).    48 year old female with dyspnea.  Likely heart failure.  BNP is elevated from priors.  She endorses orthopnea.  Chest x-ray with edema.  She denies any chest pain EKG is abnormal and changed.  UDS is positive for cocaine although she is denies recent use to me.  Troponin is Millie elevated, this appears that it might be chronic though.  Clinically doubt pneumonia.  Final Clinical Impressions(s) / ED Diagnoses   Final diagnoses:  Acute on chronic heart failure, unspecified heart failure type [facility])    ED Discharge Orders    None       Virgel Manifold, MD 01/30/18 1136

## 2018-01-20 NOTE — Progress Notes (Signed)
Patient having severe leg cramps.  Dr. Loleta Books notified new orders to be  received.

## 2018-01-20 NOTE — H&P (Signed)
History and Physical  Patient Name: Sabrina Holland     IRJ:[id number]    DOB: [birth year]    DOA: 01/20/2018 PCP: Gildardo Pounds, NP  Patient coming from: Home  Chief Complaint: Dyspnea on exertion, orthopnea      HPI: Sabrina Holland is a 48 y.o. F with hx CHF EF 25-30%, polysubstance abuse actively using cocaine, COPD not on O2 still smoking, BrCA T2aN0 no recurrences and HTN and TIA who presents with several days worsening dyspnea.  At baseline, the patient is able to walk 1-2 blocks without dyspnea, sleeps on 2 pillows.  However in the last week, she has noticed progressively worsening shortness of breath with exertion, orthopnea, and abdominal fullness with nonproductive cough.  Yesterday evening, her dyspnea was so that she could not walk more than a few steps without feeling abdominal fullness and shortness of breath so she came to the ER.  There is been no fever, wheezing, sputum production.  ED course: - Afebrile, heart rate 102, respirations 26, blood pressure 123/103, pulse ox 1% on room air -Na 141, K 4.2, Cr 0.9, WBC 6.5K, Hgb 11.3 -Troponin 0.03 - BNP 2000 (up from 1500 last November) -Urine drug screen positive for THC and cocaine -Urinalysis with no white blood cells or red blood cells -Chest x-ray showed hyperinflation, bilateral opacities consistent with edema, possible infiltrate, marked cardiomegaly - She was given 100 mg IV Lasix, potassium supplement, and the hospital service were asked to evaluate for congestive heart failure flare   When asked, the patient states that she thought she had a COPD flare and that her inhalers were not working.  She states that she has never been told that she has congestive heart failure, was told last fall that she was admitted for COPD.           ROS: Review of Systems  Constitutional: Negative for chills, fever and malaise/fatigue.  HENT: Negative for congestion and sore throat.   Respiratory: Positive  for cough and shortness of breath. Negative for hemoptysis, sputum production and wheezing.   Cardiovascular: Positive for orthopnea and PND. Negative for chest pain, palpitations and leg swelling.  All other systems reviewed and are negative.         Past Medical History:  Diagnosis Date  . Breast cancer ([place])   . Breast cancer, stage 2 ([place]) 02/23/2011  . COPD (chronic obstructive pulmonary disease) ([place])   . Hypertension   . TIA (transient ischemic attack) 11/25/2011   "first time" (11/25/2011)    Past Surgical History:  Procedure Laterality Date  . BREAST BIOPSY  2007   left  . MASTECTOMY  09/2005   left  . TONSILLECTOMY AND ADENOIDECTOMY     "when I was little" (11/25/2011)  . TUBAL LIGATION  1993    Social History: Patient lives with her daughter.  The patient walks unassisted.  Smoker.  Is evasive about illicit drug use.  Allergies  Allergen Reactions  . Penicillins Other (See Comments)    Pt had slight throat swelling Has patient had a PCN reaction causing immediate rash, facial/tongue/throat swelling, SOB or lightheadedness with hypotension: No Has patient had a PCN reaction causing severe rash involving mucus membranes or skin necrosis: No Has patient had a PCN reaction that required hospitalization No Has patient had a PCN reaction occurring within the last 10 years: No If all of the above answers are "NO", then may proceed with Cephalosporin use.     Family history: family history includes  COPD in her father; Sickle cell anemia in her cousin.  Prior to Admission medications   Medication Sig Start Date End Date Taking? Authorizing Provider  albuterol (PROVENTIL HFA) 108 (90 Base) MCG/ACT inhaler Inhale 1-2 puffs into the lungs every 6 (six) hours as needed for wheezing or shortness of breath. Use 2 puffs 3 times daily x 3 days, then every 6 hours as needed. 12/04/17  Yes Eugenie Filler, MD  amLODipine (NORVASC) 5 MG tablet Take 1 tablet (5 mg total) by mouth  daily. 12/04/17  Yes Eugenie Filler, MD  aspirin EC 81 MG EC tablet Take 1 tablet (81 mg total) by mouth daily. 06/24/17  Yes Reyne Dumas, MD  atorvastatin (LIPITOR) 40 MG tablet Take 1 tablet (40 mg total) by mouth daily at 6 PM. 12/19/17  Yes Croitoru, Mihai, MD  budesonide-formoterol (SYMBICORT) 80-4.5 MCG/ACT inhaler Inhale 2 puffs into the lungs 2 (two) times daily. 12/04/17  Yes Eugenie Filler, MD  famotidine (PEPCID) 20 MG tablet Take 1 tablet (20 mg total) by mouth 2 (two) times daily. 12/04/17  Yes Eugenie Filler, MD  fluticasone [facility]) 50 MCG/ACT nasal spray Place 2 sprays into both nostrils daily. 12/05/17  Yes Eugenie Filler, MD  furosemide (LASIX) 20 MG tablet Take 1 tablet (20 mg total) by mouth daily. 12/05/17  Yes Eugenie Filler, MD  lisinopril (PRINIVIL,ZESTRIL) 20 MG tablet Take 1 tablet (20 mg total) by mouth daily. 12/05/17  Yes Eugenie Filler, MD  Multiple Vitamin (MULTIVITAMIN WITH MINERALS) TABS tablet Take 1 tablet by mouth daily.   Yes [provider]  spironolactone (ALDACTONE) 25 MG tablet Take 1 tablet (25 mg total) by mouth daily. 12/04/17  Yes Eugenie Filler, MD  tiotropium (SPIRIVA HANDIHALER) 18 MCG inhalation capsule Place 1 capsule (18 mcg total) into inhaler and inhale daily. 12/04/17 12/04/18 Yes Eugenie Filler, MD       Physical Exam: BP (!) 136/104 (BP Location: Right Arm)   Pulse (!) 117   Temp 97.7 F (36.5 C) (Axillary)   Resp (!) 24   Ht '5\' 5"'  (1.651 m)   Wt 60.3 kg   LMP 02/18/2015 (Exact Date) Comment: MAYBE ONCE A YEAR  SpO2 96%   BMI 22.12 kg/m  General appearance: Thin adult female, alert and in no acute distress, interactive.   Eyes: Anicteric, conjunctiva pink, lids and lashes normal. PERRL.    ENT: No nasal deformity, discharge, epistaxis.  Hearing normal. OP moist without lesions.  Edentulous, lips normal. Neck: No neck masses.  Trachea midline.  No thyromegaly/tenderness. Lymph: No cervical  or supraclavicular lymphadenopathy. Skin: Warm and dry.  No suspicious rashes or lesions. Cardiac: Tachycardic, regular, nl S1-S2, no murmurs appreciated.  Capillary refill is brisk.  JVP elevated mid neck.  No LE edema.  Radial pulses 2+ and symmetric. Respiratory: Normal respiratory rate and rhythm.  No wheezing. Rales at bilateral bases. Abdomen: Abdomen soft.  Mild diffuse TTP without guarding.  She has ascites, no hepatosplenomegaly that I appreciate.   MSK: No deformities or effusions of the large joints of the upper or lower extremities bilaterally.  No cyanosis or clubbing.  Diffuse loss of subcutaneous muscle mass and fat. Neuro: Cranial nerves 3-12 intact.  Sensation intact to light touch. Speech is fluent.  Muscle strength normal.    Psych: Sensorium intact and responding to questions, attention normal.  Behavior appropriate.  Affect normal.  Judgment and insight appear normal.     Labs on Admission:  I have personally reviewed following labs and imaging studies: CBC: Recent Labs  Lab 01/20/18 0428  WBC 6.5  NEUTROABS 3.1  HGB 11.3*  HCT 36.9  MCV 88.1  PLT 644   Basic Metabolic Panel: Recent Labs  Lab 01/20/18 0428  NA 141  K 4.2  CL 105  CO2 27  GLUCOSE 140*  BUN 14  CREATININE 0.90  CALCIUM 9.1   GFR: Estimated Creatinine Clearance: 68 mL/min (by C-G formula based on SCr of 0.9 mg/dL).  Cardiac Enzymes: Recent Labs  Lab 01/20/18 0428  TROPONINI 0.03*           Radiological Exams on Admission: Personally reviewed CXR shows hyperinflation from COPD, marked cardiomegaly and bialtearl opacities consistent with edema: Dg Chest 2 View  Result Date: 01/19/2018 CLINICAL DATA:  Shortness of breath. EXAM: CHEST - 2 VIEW COMPARISON:  Chest x-ray dated December 02, 2017. FINDINGS: Stable cardiomegaly. New increased interstitial thickening with Kerley B-lines. Lungs remain hyperinflated. New patchy opacity in the medial right lower lobe. No pleural effusion or  pneumothorax. No acute osseous abnormality. IMPRESSION: 1. Stable cardiomegaly with new mild interstitial pulmonary edema. 2. New medial right lower lobe atelectasis versus infiltrate. 3. COPD. Electronically Signed   By: Titus Dubin M.D.   On: 01/19/2018 12:40    EKG: Independently reviewed. Rate 115, sinus rhythm, LVH.  No ST changes.   Echocardiogram Nov 2019: Report reviewed. EF 25-30%, normal valves, PA pressure 43.  Echo from June 0219 showed grade III DD       Assessment/Plan  Acute on chronic systolic and diastolic CHF Follows with Dr. Abelina Bachelor.  EF 25-30%, last appointment Dec 2019.   -Furosemide 80 mg IV twice a day  -K supplement -Strict I/Os, daily weights, telemetry  -Daily monitoring renal function -ACE and spironolactone ordered.  BB held due to ongoing cocaine use     Hypertension  -Restart home lisinopril, spironolactone, amlodipine -Continue atorvastatin and aspirin  Cocaine abuse She is evasive but her UDS is positive.  COPD  No active disease -Albuterol PRN -Continue home Symbicort and Tiotropium, likely needs refills at d/c  Elevated troponin  From CHF, no chest pain.   -Trend troponin  Other medicatoins -Continue famotidine      DVT prophylaxis: Lovenox  Code Status: FULL  Family Communication: Daughter at bedside  Disposition Plan: Anticipate IV diuresis, close electrolyte monitoring.   Consults called: None Admission status: INPATIENT   At the time of admission, it appears that the appropriate admission status for this patient is INPATIENT. This is judged to be reasonable and necessary in order to provide the required intensity of service to ensure the patient's safety given: -presenting symptoms of dyspnea with 2 feet -physical exam findings of tachypnea, tachycardia, rales, and  -initial radiographic and laboratory data elevated BNP, elevated troponin, edema on CXR -in the context of their chronic comorbidities severe CHF  with EF 25% and noncompliance and ongoing cocaine use    Together, these circumstances are felt to place her at high risk for further clinical deterioration threatening life, limb, or organ requiring a high intensity of service due to this severe exacerbation of their chronic organ failure.  I certify that at the point of admission it is my clinical judgment that the patient will require inpatient hospital care spanning beyond 2 midnights from the point of admission and that early discharge would result in unnecessary risk of decompensation and readmission or threat to life, limb or bodily function.   Medical decision  making: Patient seen at 9:30 AM on 01/20/2018.  What exists of the patient's chart was reviewed in depth and summarized above.  Clinical condition: stable.        [place] [facility] Pager: please page via [email or web address], enter password [email or web address]", and identify attending under [facility]

## 2018-01-20 NOTE — ED Notes (Signed)
Attempted to give report. Charge RN asked to call back in 10-20 minutes.

## 2018-01-20 NOTE — ED Notes (Signed)
No sticks in left arm due to breast cancer.

## 2018-01-20 NOTE — ED Notes (Signed)
ED TO INPATIENT HANDOFF REPORT  Name/Age/Gender Sabrina Holland 48 y.o. female  Code Status Code Status History    Date Active Date Inactive Code Status Order ID Comments User Context   12/03/2017 0513 12/04/2017 2309 Full Code [id number]  Elwyn Reach, MD Inpatient   06/20/2017 1946 06/23/2017 1555 Full Code [id number]  Eugenie Filler, MD Inpatient   11/25/2011 0818 11/26/2011 1559 Full Code [id number]  Oti, Brantley Stage, MD ED      Home/SNF/Other Home  Chief Complaint SOB  Level of Care/Admitting Diagnosis ED Disposition    ED Disposition Condition [place]: [facility] [100102]  Level of Care: Telemetry [5]  Admit to tele based on following criteria: Acute CHF  Diagnosis: Acute on chronic systolic CHF (congestive heart failure) [facility]) [366440]  Admitting Physician: Vianne Bulls [[id number]]  Attending Physician: Vianne Bulls [[id number]]  PT Class (Do Not Modify): Observation [104]  PT Acc Code (Do Not Modify): Observation [10022]       Medical History Past Medical History:  Diagnosis Date  . Breast cancer ([place])   . Breast cancer, stage 2 ([place]) 02/23/2011  . COPD (chronic obstructive pulmonary disease) ([place])   . Hypertension   . TIA (transient ischemic attack) 11/25/2011   "first time" (11/25/2011)    Allergies Allergies  Allergen Reactions  . Penicillins Other (See Comments)    Pt had slight throat swelling Has patient had a PCN reaction causing immediate rash, facial/tongue/throat swelling, SOB or lightheadedness with hypotension: No Has patient had a PCN reaction causing severe rash involving mucus membranes or skin necrosis: No Has patient had a PCN reaction that required hospitalization No Has patient had a PCN reaction occurring within the last 10 years: No If all of the above answers are "NO", then may proceed with Cephalosporin use.     IV Location/Drains/Wounds Patient Lines/Drains/Airways Status   Active  Line/Drains/Airways    Name:   Placement date:   Placement time:   Site:   Days:   Peripheral IV 01/20/18 Right Antecubital   01/20/18    0425    Antecubital   less than 1          Labs/Imaging Results for orders placed or performed during the hospital encounter of 01/20/18 (from the past 48 hour(s))  Basic metabolic panel     Status: Abnormal   Collection Time: 01/20/18  4:28 AM  Result Value Ref Range   Sodium 141 135 - 145 mmol/L   Potassium 4.2 3.5 - 5.1 mmol/L   Chloride 105 98 - 111 mmol/L   CO2 27 22 - 32 mmol/L   Glucose, Bld 140 (H) 70 - 99 mg/dL   BUN 14 6 - 20 mg/dL   Creatinine, Ser 0.90 0.44 - 1.00 mg/dL   Calcium 9.1 8.9 - 10.3 mg/dL   GFR calc non Af Amer >60 >60 mL/min   GFR calc Af Amer >60 >60 mL/min   Anion gap 9 5 - 15    Comment: Performed at [facility], [place] [address] [place], [place] [address]  Brain natriuretic peptide     Status: Abnormal   Collection Time: 01/20/18  4:28 AM  Result Value Ref Range   B Natriuretic Peptide 2,017.6 (H) 0.0 - 100.0 pg/mL    Comment: Performed at [facility], [place] [address], [place], [place] [address]  CBC with Differential     Status: Abnormal   Collection Time: 01/20/18  4:28 AM  Result Value Ref Range   WBC 6.5 4.0 - 10.5 K/uL   RBC 4.19 3.87 - 5.11 MIL/uL   Hemoglobin 11.3 (L) 12.0 - 15.0 g/dL   HCT 36.9 36.0 - 46.0 %   MCV 88.1 80.0 - 100.0 fL   MCH 27.0 26.0 - 34.0 pg   MCHC 30.6 30.0 - 36.0 g/dL   RDW 15.5 11.5 - 15.5 %   Platelets 400 150 - 400 K/uL   nRBC 0.0 0.0 - 0.2 %   Neutrophils Relative % 47 %   Neutro Abs 3.1 1.7 - 7.7 K/uL   Lymphocytes Relative 36 %   Lymphs Abs 2.4 0.7 - 4.0 K/uL   Monocytes Relative 11 %   Monocytes Absolute 0.7 0.1 - 1.0 K/uL   Eosinophils Relative 5 %   Eosinophils Absolute 0.3 0.0 - 0.5 K/uL   Basophils Relative 1 %   Basophils Absolute 0.1 0.0 - 0.1 K/uL   Immature Granulocytes 0 %   Abs Immature Granulocytes 0.01 0.00 - 0.07  K/uL    Comment: Performed at [facility], [place] [address] [place], [place] [address]  Troponin I - ONCE - STAT     Status: Abnormal   Collection Time: 01/20/18  4:28 AM  Result Value Ref Range   Troponin I 0.03 (HH) <0.03 ng/mL    Comment: CRITICAL RESULT CALLED TO, READ BACK BY AND VERIFIED WITH: J OXENDINE,RN 01/20/2018 0549 RHOLMES Performed at [facility], [place] [address], [place], [place] [address]   Rapid urine drug screen (hospital performed)     Status: Abnormal   Collection Time: 01/20/18  5:10 AM  Result Value Ref Range   Opiates NONE DETECTED NONE DETECTED   Cocaine POSITIVE (A) NONE DETECTED   Benzodiazepines NONE DETECTED NONE DETECTED   Amphetamines NONE DETECTED NONE DETECTED   Tetrahydrocannabinol POSITIVE (A) NONE DETECTED   Barbiturates NONE DETECTED NONE DETECTED    Comment: (NOTE) DRUG SCREEN FOR MEDICAL PURPOSES ONLY.  IF CONFIRMATION IS NEEDED FOR ANY PURPOSE, NOTIFY LAB WITHIN 5 DAYS. LOWEST DETECTABLE LIMITS FOR URINE DRUG SCREEN Drug Class                     Cutoff (ng/mL) Amphetamine and metabolites    1000 Barbiturate and metabolites    200 Benzodiazepine                 993 Tricyclics and metabolites     300 Opiates and metabolites        300 Cocaine and metabolites        300 THC                            50 Performed at [facility], [place] [address], [place], [place] [address]    Dg Chest 2 View  Result Date: 01/19/2018 CLINICAL DATA:  Shortness of breath. EXAM: CHEST - 2 VIEW COMPARISON:  Chest x-ray dated December 02, 2017. FINDINGS: Stable cardiomegaly. New increased interstitial thickening with Kerley B-lines. Lungs remain hyperinflated. New patchy opacity in the medial right lower lobe. No pleural effusion or pneumothorax. No acute osseous abnormality. IMPRESSION: 1. Stable cardiomegaly with new mild interstitial pulmonary edema. 2. New medial right lower lobe atelectasis versus  infiltrate. 3. COPD. Electronically Signed   By: Titus Dubin M.D.   On: 01/19/2018 12:40    Pending Labs Unresulted Labs (From admission, onward)  Start     Ordered   01/20/18 0429  Urinalysis, Routine w reflex microscopic  Once,   R     01/20/18 0428          Vitals/Pain Today's Vitals   01/20/18 0256 01/20/18 0426 01/20/18 0433 01/20/18 0602  BP:   (!) 128/104 (!) 125/98  Pulse: (!) 118  (!) 116   Resp: (!) 24  (!) 28 (!) 23  Temp:      TempSrc:      SpO2: 97%  100% 97%  Weight:  60.3 kg    Height:  5\' 5"  (1.651 m)    PainSc:        Isolation Precautions No active isolations  Medications Medications  furosemide (LASIX) injection 60 mg (60 mg Intravenous Given 01/20/18 0428)  potassium chloride SA (K-DUR,KLOR-CON) CR tablet 40 mEq (40 mEq Oral Given 01/20/18 0611)  furosemide (LASIX) injection 40 mg (40 mg Intravenous Given 01/20/18 0611)    Mobility Walks (short of breath after long distance)

## 2018-01-20 NOTE — ED Notes (Signed)
Bedside commode placed in pt's room 

## 2018-01-21 LAB — BASIC METABOLIC PANEL
Anion gap: 14 (ref 5–15)
BUN: 17 mg/dL (ref 6–20)
CO2: 29 mmol/L (ref 22–32)
Calcium: 10 mg/dL (ref 8.9–10.3)
Chloride: 98 mmol/L (ref 98–111)
Creatinine, Ser: 0.98 mg/dL (ref 0.44–1.00)
GFR calc Af Amer: >60 mL/min (ref >60)
GFR calc non Af Amer: >60 mL/min (ref >60)
Glucose, Bld: 106 mg/dL — ABNORMAL HIGH (ref 70–99)
POTASSIUM: 4 mmol/L (ref 3.5–5.1)
Sodium: 141 mmol/L (ref 135–145)

## 2018-01-21 LAB — CBC
HEMATOCRIT: 45 % (ref 36.0–46.0)
Hemoglobin: 14.1 g/dL (ref 12.0–15.0)
MCH: 27.2 pg (ref 26.0–34.0)
MCHC: 31.3 g/dL (ref 30.0–36.0)
MCV: 86.7 fL (ref 80.0–100.0)
Platelets: 499 10*3/uL — ABNORMAL HIGH (ref 150–400)
RBC: 5.19 MIL/uL — ABNORMAL HIGH (ref 3.87–5.11)
RDW: 15.3 % (ref 11.5–15.5)
WBC: 8 10*3/uL (ref 4.0–10.5)
nRBC: 0 % (ref 0.0–0.2)

## 2018-01-21 MED ORDER — FUROSEMIDE 20 MG PO TABS: 40.0000 mg | ORAL_TABLET | Freq: Two times a day (BID) | ORAL | 0 refills | Status: DC

## 2018-01-21 MED ORDER — BUDESONIDE-FORMOTEROL FUMARATE 80-4.5 MCG/ACT IN AERO: 2.0000 | INHALATION_SPRAY | Freq: Two times a day (BID) | RESPIRATORY_TRACT | 0 refills | Status: DC

## 2018-01-21 MED ORDER — ALBUTEROL SULFATE HFA 108 (90 BASE) MCG/ACT IN AERS: 1.0000 | INHALATION_SPRAY | Freq: Four times a day (QID) | RESPIRATORY_TRACT | 0 refills | Status: DC | PRN

## 2018-01-21 MED ORDER — NICOTINE 14 MG/24HR TD PT24: 14.0000 mg | MEDICATED_PATCH | Freq: Every day | TRANSDERMAL | 0 refills | Status: DC

## 2018-01-21 MED ORDER — TIOTROPIUM BROMIDE MONOHYDRATE 18 MCG IN CAPS: 18.0000 ug | ORAL_CAPSULE | Freq: Every day | RESPIRATORY_TRACT | 11 refills | Status: DC

## 2018-01-21 NOTE — Discharge Summary (Signed)
Physician Discharge Summary  Sabrina Holland HWE:[id number] DOB: 1970/12/16 DOA: 01/20/2018  PCP: Gildardo Pounds, NP  Admit date: 01/20/2018 Discharge date: 01/21/2018  Time spent: 45 minutes  Recommendations for Outpatient Follow-up:  1. Would recommend close follow-up with cardiologist in the outpatient setting and adjustment of heart failure medications inclusive of Lasix 2. I have CCed her cardiologist Dr. Orene Desanctis to ensure that he is aware of the events as he had just seen her 12th 2019 and I have reviewed his note extensively 3. She should get lab work in about 1 week but if unable to do so can get this done at cardiology visit 4. Refilled multiple inhalers and given samples from home given her historic inability to obtain some meds  Discharge Diagnoses:  Principal Problem:   Acute on chronic systolic CHF (congestive heart failure) (HCC) Active Problems:   Essential hypertension   Polysubstance abuse (HCC)   COPD (chronic obstructive pulmonary disease) (HCC)   Cocaine abuse (HCC)   Acute on chronic systolic and diastolic heart failure, NYHA class 3 ([place])   Discharge Condition: Improved but guarded  Diet recommendation: Heart healthy  Filed Weights   01/20/18 0426 01/20/18 0830 01/21/18 0500  Weight: 60.3 kg 60.3 kg 58 kg    History of present illness:  48 year old female with heart failure EF 25-30% by echocardiogram Active [place] using cocaine up to 2 weeks ago COPD not on oxygen but still smoker breast cancer [id number] with no recurrence hypertension prior TIA admitted with several days ongoing dyspnea DOE abdominal fullness and shortness of breath On admission found to have BNP of 2000 heart rate was 100 urine drug screen was positive for THC cocaine chest x-ray showed edema No and: She was admitted given IV Lasix 100 in the emergency room and then 80 of Lasix in the past 4 L of urine She was insistent on going home-I clearly explained to her that we may need to  titrate her dose of medications but she stated she had to take care of some business and therefore we elected to give her an increase in her dose of Lasix at a moderate dose of 40 mg twice daily we have relabeled her inhalers for home use I have counseled her about the risks of cocaine use and have recommended her to stop using completely  She is reasonably stabilized at this point although will require close follow-up in the outpatient setting and further discussion with her cardiologist who seems to know her very well     Discharge Exam: Vitals:   01/21/18 0649 01/21/18 0939  BP: (!) 134/96   Pulse: (!) 117   Resp: 20   Temp: 97.8 F (36.6 C)   SpO2: 96% 96%    General: Alert pleasant oriented no distress frail asthenic female no JVD no bruit S1-S2 no murmur Chest is clear Nurse and patient ambulated up and down the hallway patient was without event but did not desat Cardiovascular: S1-S2 no murmur telemetry shows sinus rhythm Respiratory: Clinically clear no wheeze no rales no rhonchi and no adventitious sounds  Discharge Instructions   Discharge Instructions    Diet - low sodium heart healthy   Complete by:  As directed    Discharge instructions   Complete by:  As directed    We have relabel some of your inhalers for home use I have also prescribed some of these for you in the outpatient setting to get unfortunately we cannot prescribe your rescue inhaler at this time  and give you a sample You were on only 20 mg of Lasix (1 tablet) at home-I have increased you to 40 mg (2 tablets)to be taken in the morning and then in the evening I would recommend that you follow closely with the heart doctor on the 14th that you have an appointment with already and try to stop smoking and stop using cocaine overall as you have a low heart function and this will definitely help you feel better and stay out of heart failure -Please make sure that you get labs to check your kidney function in  the next week but if not possible it is okay to get this done at your cardiologist office check your weight daily and if you have gained 2 or 3 pounds over 24-hour period of time take an extra 40 mg of Lasix please make sure that you weigh yourself on the same scale with the same amount of clothes daily Good luck   Increase activity slowly   Complete by:  As directed      Allergies as of 01/21/2018      Reactions   Penicillins Other (See Comments)   Pt had slight throat swelling Has patient had a PCN reaction causing immediate rash, facial/tongue/throat swelling, SOB or lightheadedness with hypotension: No Has patient had a PCN reaction causing severe rash involving mucus membranes or skin necrosis: No Has patient had a PCN reaction that required hospitalization No Has patient had a PCN reaction occurring within the last 10 years: No If all of the above answers are "NO", then may proceed with Cephalosporin use.      Medication List    TAKE these medications   albuterol 108 (90 Base) MCG/ACT inhaler Commonly known as:  PROVENTIL HFA Inhale 1-2 puffs into the lungs every 6 (six) hours as needed for wheezing or shortness of breath. Use 2 puffs 3 times daily x 3 days, then every 6 hours as needed.   amLODipine 5 MG tablet Commonly known as:  NORVASC Take 1 tablet (5 mg total) by mouth daily.   aspirin 81 MG EC tablet Take 1 tablet (81 mg total) by mouth daily.   atorvastatin 40 MG tablet Commonly known as:  LIPITOR Take 1 tablet (40 mg total) by mouth daily at 6 PM.   budesonide-formoterol 80-4.5 MCG/ACT inhaler Commonly known as:  SYMBICORT Inhale 2 puffs into the lungs 2 (two) times daily.   famotidine 20 MG tablet Commonly known as:  PEPCID Take 1 tablet (20 mg total) by mouth 2 (two) times daily.   fluticasone 50 MCG/ACT nasal spray Commonly known as:  FLONASE Place 2 sprays into both nostrils daily.   furosemide 20 MG tablet Commonly known as:  LASIX Take 2 tablets (40  mg total) by mouth 2 (two) times daily. What changed:    how much to take  when to take this   lisinopril 20 MG tablet Commonly known as:  PRINIVIL,ZESTRIL Take 1 tablet (20 mg total) by mouth daily.   multivitamin with minerals Tabs tablet Take 1 tablet by mouth daily.   nicotine 14 mg/24hr patch Commonly known as:  NICODERM CQ - dosed in mg/24 hours Place 1 patch (14 mg total) onto the skin daily. Start taking on:  January 22, 2018   spironolactone 25 MG tablet Commonly known as:  ALDACTONE Take 1 tablet (25 mg total) by mouth daily.   tiotropium 18 MCG inhalation capsule Commonly known as:  SPIRIVA HANDIHALER Place 1 capsule (18 mcg total)  into inhaler and inhale daily.      Allergies  Allergen Reactions  . Penicillins Other (See Comments)    Pt had slight throat swelling Has patient had a PCN reaction causing immediate rash, facial/tongue/throat swelling, SOB or lightheadedness with hypotension: No Has patient had a PCN reaction causing severe rash involving mucus membranes or skin necrosis: No Has patient had a PCN reaction that required hospitalization No Has patient had a PCN reaction occurring within the last 10 years: No If all of the above answers are "NO", then may proceed with Cephalosporin use.       The results of significant diagnostics from this hospitalization (including imaging, microbiology, ancillary and laboratory) are listed below for reference.    Significant Diagnostic Studies: Dg Chest 2 View  Result Date: 01/19/2018 CLINICAL DATA:  Shortness of breath. EXAM: CHEST - 2 VIEW COMPARISON:  Chest x-ray dated December 02, 2017. FINDINGS: Stable cardiomegaly. New increased interstitial thickening with Kerley B-lines. Lungs remain hyperinflated. New patchy opacity in the medial right lower lobe. No pleural effusion or pneumothorax. No acute osseous abnormality. IMPRESSION: 1. Stable cardiomegaly with new mild interstitial pulmonary edema. 2. New medial  right lower lobe atelectasis versus infiltrate. 3. COPD. Electronically Signed   By: Titus Dubin M.D.   On: 01/19/2018 12:40    Microbiology: No results found for this or any previous visit (from the past 240 hour(s)).   Labs: Basic Metabolic Panel: Recent Labs  Lab 01/20/18 0428 01/21/18 0815  NA 141 141  K 4.2 4.0  CL 105 98  CO2 27 29  GLUCOSE 140* 106*  BUN 14 17  CREATININE 0.90 0.98  CALCIUM 9.1 10.0   Liver Function Tests: No results for input(s): AST, ALT, ALKPHOS, BILITOT, PROT, ALBUMIN in the last 168 hours. No results for input(s): LIPASE, AMYLASE in the last 168 hours. No results for input(s): AMMONIA in the last 168 hours. CBC: Recent Labs  Lab 01/20/18 0428 01/21/18 0815  WBC 6.5 8.0  NEUTROABS 3.1  --   HGB 11.3* 14.1  HCT 36.9 45.0  MCV 88.1 86.7  PLT 400 499*   Cardiac Enzymes: Recent Labs  Lab 01/20/18 0428 01/20/18 1154  TROPONINI 0.03* 0.03*   BNP: BNP (last 3 results) Recent Labs    11/06/17 1023 12/02/17 2311 01/20/18 0428  BNP 456.2* 1,499.0* 2,017.6*    ProBNP (last 3 results) No results for input(s): PROBNP in the last 8760 hours.  CBG: No results for input(s): GLUCAP in the last 168 hours.     Signed:  Nita Sells MD   [facility] 01/21/2018, 9:49 AM

## 2018-01-22 ENCOUNTER — Ambulatory Visit: Payer: Self-pay | Admitting: Cardiology

## 2018-01-22 ENCOUNTER — Telehealth: Payer: Self-pay

## 2018-01-22 NOTE — Telephone Encounter (Signed)
Transition Care Management Follow-up Telephone Call Date of discharge and from where: 01/21/2018 - [facility]  Call placed to # [phone] and a message was left requesting a call back to this CM # [phone].  Call placed to # [phone] x 3 and each time the phone started to ring and then went to busy signal

## 2018-01-23 ENCOUNTER — Telehealth: Payer: Self-pay

## 2018-01-23 NOTE — Telephone Encounter (Signed)
Noted! Thank you

## 2018-01-23 NOTE — Telephone Encounter (Signed)
Transition Care Management Follow-up Telephone Call  The patient was not able to schedule an appointment until after the 14 day transitional care time frame as she said that her work schedule is set for the next week and is not able to get to the clinic in the morning for the appointments that were available. An appointment was scheduled for 02/05/2018 @ 1050     Date of discharge and from where: 01/21/2018, [facility]   How have you been since you were released from the hospital? She stated that she is " feeling okay."   Any questions or concerns? She did not have any questions/concerns   Items Reviewed:  Did the pt receive and understand the discharge instructions provided?she said that she had the discharge instructions and did not have any questions   Medications obtained and verified? She said that she has the inhalers that were given to her. She also noted that she all of her other medications and didn't have any questions and she didn't need to review the medication list. When asked if she understood that there is a change with her lasix 20 mg - take 2 tablets by mouth two times daily. she said that she has been taking only 20 mg twice daily. She thought that she was to take 40 mg total /day. This CM read the discharge instructions to her regarding the lasix and she said that she understood and it made sense to her.   Any new allergies since your discharge?none reported   Do you have support at home? She said that she stays with her daughter.   Other (ie: DME, Home Health, etc) she only has a scale. Said she weighs herself every morning and her weight continues to remain at 126 lbs and she knows when to notify the provider of a weight gain.    Follow up appointments reviewed:    PCP Hospital f/u appt confirmed? An appointment was scheduled for 02/05/2018 @ [facility] f/u appt confirmed? She said that she is aware of the cardiology appointment 01/30/2018  Are  transportation arrangements needed? She said that she will take the bus. She explained that she didn't qualify for [facility].   If their condition worsens, is the pt aware to call  their PCP or go to the ED? yes  Was the patient provided with contact information for the PCP's office or ED?she has the phone number  Was the pt encouraged to call back with questions or concerns? yes

## 2018-01-30 ENCOUNTER — Ambulatory Visit: Payer: Self-pay | Admitting: Cardiology

## 2018-01-31 ENCOUNTER — Ambulatory Visit: Payer: Self-pay | Admitting: Cardiology

## 2018-02-05 ENCOUNTER — Ambulatory Visit: Payer: Self-pay | Admitting: Nurse Practitioner

## 2018-02-19 ENCOUNTER — Emergency Department (HOSPITAL_COMMUNITY): Payer: Self-pay

## 2018-02-19 ENCOUNTER — Emergency Department (HOSPITAL_COMMUNITY)
Admission: EM | Admit: 2018-02-19 | Discharge: 2018-02-19 | Disposition: A | Discharge status: Home / Self Care | Payer: Self-pay | Attending: Emergency Medicine | Admitting: Emergency Medicine

## 2018-02-19 ENCOUNTER — Encounter (HOSPITAL_COMMUNITY): Payer: Self-pay | Admitting: Emergency Medicine

## 2018-02-19 DIAGNOSIS — R0602 Shortness of breath: Secondary | ICD-10-CM | POA: Insufficient documentation

## 2018-02-19 DIAGNOSIS — J449 Chronic obstructive pulmonary disease, unspecified: Secondary | ICD-10-CM | POA: Insufficient documentation

## 2018-02-19 DIAGNOSIS — F17210 Nicotine dependence, cigarettes, uncomplicated: Secondary | ICD-10-CM | POA: Insufficient documentation

## 2018-02-19 DIAGNOSIS — R0600 Dyspnea, unspecified: Secondary | ICD-10-CM | POA: Insufficient documentation

## 2018-02-19 DIAGNOSIS — Z79899 Other long term (current) drug therapy: Secondary | ICD-10-CM | POA: Insufficient documentation

## 2018-02-19 DIAGNOSIS — Z7982 Long term (current) use of aspirin: Secondary | ICD-10-CM | POA: Insufficient documentation

## 2018-02-19 DIAGNOSIS — I5042 Chronic combined systolic (congestive) and diastolic (congestive) heart failure: Secondary | ICD-10-CM | POA: Insufficient documentation

## 2018-02-19 DIAGNOSIS — I110 Hypertensive heart disease with heart failure: Secondary | ICD-10-CM | POA: Insufficient documentation

## 2018-02-19 LAB — CBC WITH DIFFERENTIAL/PLATELET
Abs Immature Granulocytes: 0.03 10*3/uL (ref 0.00–0.07)
Basophils Absolute: 0 10*3/uL (ref 0.0–0.1)
Basophils Relative: 1 %
Eosinophils Absolute: 0.1 10*3/uL (ref 0.0–0.5)
Eosinophils Relative: 1 %
HCT: 39.7 % (ref 36.0–46.0)
Hemoglobin: 12.2 g/dL (ref 12.0–15.0)
IMMATURE GRANULOCYTES: 1 %
LYMPHS ABS: 2.7 10*3/uL (ref 0.7–4.0)
Lymphocytes Relative: 43 %
MCH: 26.3 pg (ref 26.0–34.0)
MCHC: 30.7 g/dL (ref 30.0–36.0)
MCV: 85.6 fL (ref 80.0–100.0)
Monocytes Absolute: 0.6 10*3/uL (ref 0.1–1.0)
Monocytes Relative: 10 %
Neutro Abs: 2.9 10*3/uL (ref 1.7–7.7)
Neutrophils Relative %: 44 %
Platelets: 416 10*3/uL — ABNORMAL HIGH (ref 150–400)
RBC: 4.64 MIL/uL (ref 3.87–5.11)
RDW: 17.2 % — ABNORMAL HIGH (ref 11.5–15.5)
WBC: 6.3 10*3/uL (ref 4.0–10.5)
nRBC: 0 % (ref 0.0–0.2)

## 2018-02-19 LAB — COMPREHENSIVE METABOLIC PANEL
ALT: 24 U/L (ref 0–44)
AST: 55 U/L — ABNORMAL HIGH (ref 15–41)
Albumin: 3.9 g/dL (ref 3.5–5.0)
Alkaline Phosphatase: 76 U/L (ref 38–126)
Anion gap: 9 (ref 5–15)
BUN: 11 mg/dL (ref 6–20)
CO2: 22 mmol/L (ref 22–32)
Calcium: 9.2 mg/dL (ref 8.9–10.3)
Chloride: 106 mmol/L (ref 98–111)
Creatinine, Ser: 0.85 mg/dL (ref 0.44–1.00)
GFR calc Af Amer: >60 mL/min (ref >60)
GFR calc non Af Amer: >60 mL/min (ref >60)
Glucose, Bld: 103 mg/dL — ABNORMAL HIGH (ref 70–99)
POTASSIUM: 5.1 mmol/L (ref 3.5–5.1)
SODIUM: 137 mmol/L (ref 135–145)
Total Bilirubin: 0.9 mg/dL (ref 0.3–1.2)
Total Protein: 7.4 g/dL (ref 6.5–8.1)

## 2018-02-19 LAB — BRAIN NATRIURETIC PEPTIDE: B NATRIURETIC PEPTIDE 5: 1530.5 pg/mL — AB (ref 0.0–100.0)

## 2018-02-19 LAB — TROPONIN I: Troponin I: <0.03 ng/mL (ref <0.03)

## 2018-02-19 MED ORDER — METHYLPREDNISOLONE SODIUM SUCC 125 MG IJ SOLR
125.0000 mg | Freq: Once | INTRAMUSCULAR | Status: AC
  Administered 2018-02-19: 125 mg via INTRAVENOUS
  Filled 2018-02-19: qty 2

## 2018-02-19 MED ORDER — KETOROLAC TROMETHAMINE 30 MG/ML IJ SOLN
15.0000 mg | Freq: Once | INTRAMUSCULAR | Status: AC
  Administered 2018-02-19: 15 mg via INTRAVENOUS
  Filled 2018-02-19: qty 1

## 2018-02-19 MED ORDER — ALBUTEROL SULFATE (2.5 MG/3ML) 0.083% IN NEBU
5.0000 mg | INHALATION_SOLUTION | Freq: Once | RESPIRATORY_TRACT | Status: AC
  Administered 2018-02-19: 5 mg via RESPIRATORY_TRACT
  Filled 2018-02-19: qty 6

## 2018-02-19 MED ORDER — IPRATROPIUM-ALBUTEROL 0.5-2.5 (3) MG/3ML IN SOLN
3.0000 mL | Freq: Once | RESPIRATORY_TRACT | Status: AC
  Administered 2018-02-19: 3 mL via RESPIRATORY_TRACT
  Filled 2018-02-19: qty 3

## 2018-02-19 MED ORDER — PREDNISONE 20 MG PO TABS: 40.0000 mg | ORAL_TABLET | Freq: Every day | ORAL | 0 refills | Status: DC

## 2018-02-19 NOTE — ED Provider Notes (Signed)
[facility] DEPT Provider Note   CSN: [id number] Arrival date & time: 02/19/18  7858     History   Chief Complaint Chief Complaint  Patient presents with  . Shortness of Breath    HPI Sabrina Holland is a 48 y.o. female.  HPI Patient presents with dyspnea, no pain. Patient has a history of COPD, notes that when she uses cocaine she has episodes of dyspnea. She notes that after stressful event last week she used cocaine, since that time has had persistent dyspnea, no worse since yesterday. No new fever, no new pain, and the patient has none of her home medication for relief. With worsening dyspnea, she presents for evaluation. She states that she was clean from using drugs for almost 1 month prior to her relapse last week.  Past Medical History:  Diagnosis Date  . Breast cancer ([place])   . Breast cancer, stage 2 ([place]) 02/23/2011  . COPD (chronic obstructive pulmonary disease) ([place])   . Hypertension   . TIA (transient ischemic attack) 11/25/2011   "first time" (11/25/2011)    Patient Active Problem List   Diagnosis Date Noted  . Acute on chronic systolic CHF (congestive heart failure) ([place]) 01/20/2018  . Acute on chronic systolic and diastolic heart failure, NYHA class 3 ([place]) 01/20/2018  . Cocaine abuse ([place]) 12/19/2017  . Mixed hyperlipidemia 12/19/2017  . COPD exacerbation ([place])   . Acute on chronic respiratory failure with hypoxemia ([place]) 12/03/2017  . Cardiomyopathy ([place]) 07/04/2017  . COPD (chronic obstructive pulmonary disease) ([place])   . Chronic combined systolic and diastolic heart failure ([place])   . Anemia 06/20/2017  . Polysubstance abuse ([place]) 11/26/2011  . TIA (transient ischemic attack) 11/25/2011  . Essential hypertension 11/25/2011  . Sinusitis 11/25/2011  . Vitamin B 12 deficiency 07/18/2011  . History of breast cancer 02/23/2011    Past Surgical History:  Procedure Laterality Date  . BREAST BIOPSY  2007   left    . MASTECTOMY  09/2005   left  . TONSILLECTOMY AND ADENOIDECTOMY     "when I was little" (11/25/2011)  . TUBAL LIGATION  1993     OB History    Gravida  5   Para  4   Term  4   Preterm  0   AB  1   Living        SAB  1   TAB  0   Ectopic  0   Multiple      Live Births               Home Medications    Prior to Admission medications   Medication Sig Start Date End Date Taking? Authorizing Provider  albuterol (PROVENTIL HFA) 108 (90 Base) MCG/ACT inhaler Inhale 1-2 puffs into the lungs every 6 (six) hours as needed for wheezing or shortness of breath. Use 2 puffs 3 times daily x 3 days, then every 6 hours as needed. 01/21/18  Yes Nita Sells, MD  amLODipine (NORVASC) 5 MG tablet Take 1 tablet (5 mg total) by mouth daily. 12/04/17  Yes Eugenie Filler, MD  aspirin EC 81 MG EC tablet Take 1 tablet (81 mg total) by mouth daily. 06/24/17  Yes Reyne Dumas, MD  atorvastatin (LIPITOR) 40 MG tablet Take 1 tablet (40 mg total) by mouth daily at 6 PM. 12/19/17  Yes Croitoru, Mihai, MD  budesonide-formoterol (SYMBICORT) 80-4.5 MCG/ACT inhaler Inhale 2 puffs into the lungs 2 (two) times daily. 01/21/18  Yes Nita Sells, MD  famotidine (PEPCID) 20 MG tablet Take 1 tablet (20 mg total) by mouth 2 (two) times daily. 12/04/17  Yes Eugenie Filler, MD  fluticasone [facility]) 50 MCG/ACT nasal spray Place 2 sprays into both nostrils daily. 12/05/17  Yes Eugenie Filler, MD  furosemide (LASIX) 20 MG tablet Take 2 tablets (40 mg total) by mouth 2 (two) times daily. 01/21/18  Yes Nita Sells, MD  lisinopril (PRINIVIL,ZESTRIL) 20 MG tablet Take 1 tablet (20 mg total) by mouth daily. 12/05/17  Yes Eugenie Filler, MD  spironolactone (ALDACTONE) 25 MG tablet Take 1 tablet (25 mg total) by mouth daily. 12/04/17  Yes Eugenie Filler, MD  tiotropium (SPIRIVA HANDIHALER) 18 MCG inhalation capsule Place 1 capsule (18 mcg total) into inhaler and inhale daily. 01/21/18  01/21/19 Yes Nita Sells, MD  nicotine (NICODERM CQ - DOSED IN MG/24 HOURS) 14 mg/24hr patch Place 1 patch (14 mg total) onto the skin daily. Patient not taking: Reported on 02/19/2018 01/22/18   Nita Sells, MD    Family History Family History  Problem Relation Age of Onset  . COPD Father   . Sickle cell anemia Cousin        maternal cousin    Social History Social History   Tobacco Use  . Smoking status: Current Some Day Smoker    Packs/day: 0.00    Years: 20.00    Pack years: 0.00    Types: Cigarettes  . Smokeless tobacco: Never Used  . Tobacco comment: 1 or 2 cigarettes a day- approx 1 pack week  Substance Use Topics  . Alcohol use: Yes    Alcohol/week: 12.0 standard drinks    Types: 12 Cans of beer per week    Comment: occasionally  . Drug use: Yes    Types: Marijuana, Cocaine    Comment: patient states she uses cocaine and marijuana daily     Allergies   Penicillins   Review of Systems Review of Systems  Constitutional:       Per HPI, otherwise negative  HENT:       Per HPI, otherwise negative  Respiratory:       Per HPI, otherwise negative  Cardiovascular:       Per HPI, otherwise negative  Gastrointestinal: Negative for vomiting.  Endocrine:       Negative aside from HPI  Genitourinary:       Neg aside from HPI   Musculoskeletal:       Per HPI, otherwise negative  Skin: Negative.   Neurological: Negative for syncope.  Psychiatric/Behavioral:       Substance abuse     Physical Exam Updated Vital Signs BP (!) 149/122 (BP Location: Right Arm)   Pulse (!) 118   Temp 97.9 F (36.6 C) (Oral)   Resp 20   LMP 02/18/2015 (Exact Date) Comment: MAYBE ONCE A YEAR  SpO2 98%   Physical Exam Vitals signs and nursing note reviewed.  Constitutional:      General: She is not in acute distress.    Appearance: She is well-developed.  HENT:     Head: Normocephalic and atraumatic.  Eyes:     Conjunctiva/sclera: Conjunctivae normal.    Cardiovascular:     Rate and Rhythm: Regular rhythm. Tachycardia present.  Pulmonary:     Effort: Pulmonary effort is normal. Tachypnea present.     Breath sounds: Decreased breath sounds present.  Abdominal:     General: There is no distension.  Skin:  General: Skin is warm and dry.  Neurological:     Mental Status: She is alert and oriented to person, place, and time.     Cranial Nerves: No cranial nerve deficit.  Psychiatric:        Mood and Affect: Mood normal.        Behavior: Behavior normal.      ED Treatments / Results  Labs (all labs ordered are listed, but only abnormal results are displayed) Labs Reviewed  COMPREHENSIVE METABOLIC PANEL - Abnormal; Notable for the following components:      Result Value   Glucose, Bld 103 (*)    AST 55 (*)    All other components within normal limits  CBC WITH DIFFERENTIAL/PLATELET - Abnormal; Notable for the following components:   RDW 17.2 (*)    Platelets 416 (*)    All other components within normal limits  BRAIN NATRIURETIC PEPTIDE - Abnormal; Notable for the following components:   B Natriuretic Peptide 1,530.5 (*)    All other components within normal limits  TROPONIN I    EKG EKG Interpretation  Date/Time:  Monday February 19 2018 08:42:37 EST Ventricular Rate:  115 PR Interval:    QRS Duration: 79 QT Interval:  347 QTC Calculation: 480 R Axis:   98 Text Interpretation:  Sinus tachycardia Left atrial enlargement Borderline right axis deviation Left ventricular hypertrophy Anterior Q waves, possibly due to LVH Abnormal ekg Confirmed by Carmin Muskrat [phone]) on 02/19/2018 9:07:13 AM   Radiology Dg Chest 2 View  Result Date: 02/19/2018 CLINICAL DATA:  Shortness of breath EXAM: CHEST - 2 VIEW COMPARISON:  01/19/2018 FINDINGS: Chronic cardiomegaly. Interstitial coarsening above 2019 baseline. Hyperinflation. No air bronchogram, effusion or visible pneumothorax. Stable bandlike density at the left base consistent  with scarring. IMPRESSION: 1. Mild congestive or bronchitic interstitial coarsening. 2. COPD and cardiomegaly. Electronically Signed   By: Monte Fantasia M.D.   On: 02/19/2018 09:27    Procedures Procedures (including critical care time)  Medications Ordered in ED Medications  albuterol (PROVENTIL) (2.5 MG/3ML) 0.083% nebulizer solution 5 mg (5 mg Nebulization Given 02/19/18 0921)  methylPREDNISolone sodium succinate (SOLU-MEDROL) 125 mg/2 mL injection 125 mg (125 mg Intravenous Given 02/19/18 0913)  ketorolac (TORADOL) 30 MG/ML injection 15 mg (15 mg Intravenous Given 02/19/18 0959)  ipratropium-albuterol (DUONEB) 0.5-2.5 (3) MG/3ML nebulizer solution 3 mL (3 mLs Nebulization Given 02/19/18 1040)     Initial Impression / Assessment and Plan / ED Course  I have reviewed the triage vital signs and the nursing notes.  Pertinent labs & imaging results that were available during my care of the patient were reviewed by me and considered in my medical decision making (see chart for details).     12:46 PM Patient sleeping after 2 breathing treatments, albuterol, DuoNeb. With no ongoing distress, no increased work of breathing, she does have mild tachycardia, likely secondary to the albuterol, and recent cocaine use. No evidence for pneumonia, no decompensated heart failure, no new hypoxia. Patient discharged in stable condition.  Final Clinical Impressions(s) / ED Diagnoses  Shortness of breath   Carmin Muskrat, MD 02/19/18 1246

## 2018-02-19 NOTE — Discharge Instructions (Signed)
As discussed, it is important that you obtain your recent prescriptions, and refrain from using additional illicit substances. Please be sure to follow-up with your physician or return here for concerning changes in your condition.

## 2018-02-19 NOTE — ED Notes (Signed)
Off floor for testing 

## 2018-02-19 NOTE — ED Notes (Signed)
RT at bedside.

## 2018-02-19 NOTE — ED Notes (Signed)
Pt called out to go to the bathroom assisted pt with that and pt got mad because she dropped her cup of ice on her bed. I didn't know pt gown was wet so I go to proceed and change the bed sheet, pt gets upset that I did not change her gown first, she started cussing and being rude. Writer stepped out of the room to get the RN Shelia to assist with the gown, and to witness the rudeness, then pt started being rude to [facility] stating " I will never come back on your shift" .

## 2018-02-19 NOTE — ED Notes (Signed)
RT notified of duo neb.

## 2018-02-19 NOTE — ED Triage Notes (Signed)
Per pt, states difficulty breathing due to history of COPD/CHF exacerbated by using crack/cocaine-states she has been up for 32 hours-recent relapse due to loosing job

## 2018-02-26 ENCOUNTER — Other Ambulatory Visit: Payer: Self-pay

## 2018-02-26 ENCOUNTER — Emergency Department (HOSPITAL_COMMUNITY): Payer: Self-pay

## 2018-02-26 ENCOUNTER — Inpatient Hospital Stay (HOSPITAL_COMMUNITY)
Admission: EM | Admit: 2018-02-26 | Discharge: 2018-02-28 | DRG: 292 | Disposition: A | Discharge status: Home / Self Care | Payer: Self-pay | Attending: Internal Medicine | Admitting: Internal Medicine

## 2018-02-26 ENCOUNTER — Encounter (HOSPITAL_COMMUNITY): Payer: Self-pay

## 2018-02-26 DIAGNOSIS — J449 Chronic obstructive pulmonary disease, unspecified: Secondary | ICD-10-CM | POA: Diagnosis present

## 2018-02-26 DIAGNOSIS — Z832 Family history of diseases of the blood and blood-forming organs and certain disorders involving the immune mechanism: Secondary | ICD-10-CM

## 2018-02-26 DIAGNOSIS — R0600 Dyspnea, unspecified: Secondary | ICD-10-CM

## 2018-02-26 DIAGNOSIS — Z7951 Long term (current) use of inhaled steroids: Secondary | ICD-10-CM

## 2018-02-26 DIAGNOSIS — J441 Chronic obstructive pulmonary disease with (acute) exacerbation: Secondary | ICD-10-CM

## 2018-02-26 DIAGNOSIS — I428 Other cardiomyopathies: Secondary | ICD-10-CM | POA: Diagnosis present

## 2018-02-26 DIAGNOSIS — Z7151 Drug abuse counseling and surveillance of drug abuser: Secondary | ICD-10-CM

## 2018-02-26 DIAGNOSIS — I472 Ventricular tachycardia: Secondary | ICD-10-CM | POA: Diagnosis not present

## 2018-02-26 DIAGNOSIS — E440 Moderate protein-calorie malnutrition: Secondary | ICD-10-CM | POA: Diagnosis present

## 2018-02-26 DIAGNOSIS — Z7982 Long term (current) use of aspirin: Secondary | ICD-10-CM

## 2018-02-26 DIAGNOSIS — I5043 Acute on chronic combined systolic (congestive) and diastolic (congestive) heart failure: Secondary | ICD-10-CM | POA: Diagnosis present

## 2018-02-26 DIAGNOSIS — Z79899 Other long term (current) drug therapy: Secondary | ICD-10-CM

## 2018-02-26 DIAGNOSIS — E782 Mixed hyperlipidemia: Secondary | ICD-10-CM | POA: Diagnosis present

## 2018-02-26 DIAGNOSIS — F1910 Other psychoactive substance abuse, uncomplicated: Secondary | ICD-10-CM

## 2018-02-26 DIAGNOSIS — E538 Deficiency of other specified B group vitamins: Secondary | ICD-10-CM | POA: Diagnosis present

## 2018-02-26 DIAGNOSIS — F1410 Cocaine abuse, uncomplicated: Secondary | ICD-10-CM | POA: Diagnosis present

## 2018-02-26 DIAGNOSIS — E876 Hypokalemia: Secondary | ICD-10-CM | POA: Diagnosis present

## 2018-02-26 DIAGNOSIS — I5023 Acute on chronic systolic (congestive) heart failure: Secondary | ICD-10-CM

## 2018-02-26 DIAGNOSIS — Z8673 Personal history of transient ischemic attack (TIA), and cerebral infarction without residual deficits: Secondary | ICD-10-CM

## 2018-02-26 DIAGNOSIS — Z9010 Acquired absence of unspecified breast and nipple: Secondary | ICD-10-CM

## 2018-02-26 DIAGNOSIS — Z853 Personal history of malignant neoplasm of breast: Secondary | ICD-10-CM

## 2018-02-26 DIAGNOSIS — Z880 Allergy status to penicillin: Secondary | ICD-10-CM

## 2018-02-26 DIAGNOSIS — F1290 Cannabis use, unspecified, uncomplicated: Secondary | ICD-10-CM | POA: Diagnosis present

## 2018-02-26 DIAGNOSIS — Z6821 Body mass index (BMI) 21.0-21.9, adult: Secondary | ICD-10-CM

## 2018-02-26 DIAGNOSIS — I110 Hypertensive heart disease with heart failure: Principal | ICD-10-CM | POA: Diagnosis present

## 2018-02-26 DIAGNOSIS — I5022 Chronic systolic (congestive) heart failure: Secondary | ICD-10-CM

## 2018-02-26 DIAGNOSIS — F17210 Nicotine dependence, cigarettes, uncomplicated: Secondary | ICD-10-CM | POA: Diagnosis present

## 2018-02-26 DIAGNOSIS — Z9114 Patient's other noncompliance with medication regimen: Secondary | ICD-10-CM

## 2018-02-26 DIAGNOSIS — Z825 Family history of asthma and other chronic lower respiratory diseases: Secondary | ICD-10-CM

## 2018-02-26 DIAGNOSIS — I10 Essential (primary) hypertension: Secondary | ICD-10-CM

## 2018-02-26 LAB — TSH: TSH: 0.485 u[IU]/mL (ref 0.350–4.500)

## 2018-02-26 LAB — CBC WITH DIFFERENTIAL/PLATELET
Abs Immature Granulocytes: 0.02 10*3/uL (ref 0.00–0.07)
Basophils Absolute: 0 10*3/uL (ref 0.0–0.1)
Basophils Relative: 1 %
Eosinophils Absolute: 0.2 10*3/uL (ref 0.0–0.5)
Eosinophils Relative: 3 %
HEMATOCRIT: 39.7 % (ref 36.0–46.0)
HEMOGLOBIN: 12.1 g/dL (ref 12.0–15.0)
Immature Granulocytes: 0 %
Lymphocytes Relative: 37 %
Lymphs Abs: 2.7 10*3/uL (ref 0.7–4.0)
MCH: 25.1 pg — ABNORMAL LOW (ref 26.0–34.0)
MCHC: 30.5 g/dL (ref 30.0–36.0)
MCV: 82.4 fL (ref 80.0–100.0)
Monocytes Absolute: 0.8 10*3/uL (ref 0.1–1.0)
Monocytes Relative: 11 %
Neutro Abs: 3.5 10*3/uL (ref 1.7–7.7)
Neutrophils Relative %: 48 %
Platelets: 402 10*3/uL — ABNORMAL HIGH (ref 150–400)
RBC: 4.82 MIL/uL (ref 3.87–5.11)
RDW: 16.8 % — ABNORMAL HIGH (ref 11.5–15.5)
WBC: 7.3 10*3/uL (ref 4.0–10.5)
nRBC: 0 % (ref 0.0–0.2)

## 2018-02-26 LAB — RAPID URINE DRUG SCREEN, HOSP PERFORMED
Amphetamines: NOT DETECTED
BARBITURATES: NOT DETECTED
Benzodiazepines: NOT DETECTED
Cocaine: POSITIVE — AB
Opiates: NOT DETECTED
Tetrahydrocannabinol: POSITIVE — AB

## 2018-02-26 LAB — URINALYSIS, ROUTINE W REFLEX MICROSCOPIC
BACTERIA UA: NONE SEEN
BILIRUBIN URINE: NEGATIVE
Glucose, UA: NEGATIVE mg/dL
HGB URINE DIPSTICK: NEGATIVE
Ketones, ur: NEGATIVE mg/dL
Nitrite: NEGATIVE
Protein, ur: NEGATIVE mg/dL
Specific Gravity, Urine: 1.005 (ref 1.005–1.030)
pH: 5 (ref 5.0–8.0)

## 2018-02-26 LAB — BASIC METABOLIC PANEL
Anion gap: 9 (ref 5–15)
BUN: 16 mg/dL (ref 6–20)
CALCIUM: 9.3 mg/dL (ref 8.9–10.3)
CO2: 25 mmol/L (ref 22–32)
Chloride: 104 mmol/L (ref 98–111)
Creatinine, Ser: 1.06 mg/dL — ABNORMAL HIGH (ref 0.44–1.00)
GFR calc Af Amer: >60 mL/min (ref >60)
GFR calc non Af Amer: >60 mL/min (ref >60)
Glucose, Bld: 107 mg/dL — ABNORMAL HIGH (ref 70–99)
Potassium: 4.4 mmol/L (ref 3.5–5.1)
Sodium: 138 mmol/L (ref 135–145)

## 2018-02-26 LAB — MAGNESIUM: Magnesium: 2.1 mg/dL (ref 1.7–2.4)

## 2018-02-26 LAB — TROPONIN I
Troponin I: <0.03 ng/mL (ref <0.03)
Troponin I: <0.03 ng/mL (ref <0.03)

## 2018-02-26 LAB — I-STAT TROPONIN, ED: Troponin i, poc: 0 ng/mL (ref 0.00–0.08)

## 2018-02-26 LAB — BRAIN NATRIURETIC PEPTIDE: B Natriuretic Peptide: 2027.2 pg/mL — ABNORMAL HIGH (ref 0.0–100.0)

## 2018-02-26 MED ORDER — METHYLPREDNISOLONE SODIUM SUCC 125 MG IJ SOLR
125.0000 mg | Freq: Once | INTRAMUSCULAR | Status: AC
  Administered 2018-02-26: 125 mg via INTRAVENOUS
  Filled 2018-02-26: qty 2

## 2018-02-26 MED ORDER — ALPRAZOLAM 0.25 MG PO TABS
0.2500 mg | ORAL_TABLET | Freq: Two times a day (BID) | ORAL | Status: DC | PRN
  Administered 2018-02-26 – 2018-02-28 (×3): 0.25 mg via ORAL
  Filled 2018-02-26 (×3): qty 1

## 2018-02-26 MED ORDER — ALBUTEROL SULFATE (2.5 MG/3ML) 0.083% IN NEBU
5.0000 mg | INHALATION_SOLUTION | Freq: Once | RESPIRATORY_TRACT | Status: AC
  Administered 2018-02-26: 5 mg via RESPIRATORY_TRACT
  Filled 2018-02-26: qty 6

## 2018-02-26 MED ORDER — TIOTROPIUM BROMIDE MONOHYDRATE 18 MCG IN CAPS: 18.0000 ug | ORAL_CAPSULE | Freq: Every day | RESPIRATORY_TRACT | Status: DC

## 2018-02-26 MED ORDER — MOMETASONE FURO-FORMOTEROL FUM 100-5 MCG/ACT IN AERO
2.0000 | INHALATION_SPRAY | Freq: Two times a day (BID) | RESPIRATORY_TRACT | Status: DC
  Administered 2018-02-27 – 2018-02-28 (×3): 2 via RESPIRATORY_TRACT
  Filled 2018-02-26: qty 8.8

## 2018-02-26 MED ORDER — IPRATROPIUM BROMIDE 0.02 % IN SOLN
0.5000 mg | Freq: Once | RESPIRATORY_TRACT | Status: AC
  Administered 2018-02-26: 0.5 mg via RESPIRATORY_TRACT
  Filled 2018-02-26: qty 2.5

## 2018-02-26 MED ORDER — FLUTICASONE PROPIONATE 50 MCG/ACT NA SUSP
2.0000 | Freq: Every day | NASAL | Status: DC
  Filled 2018-02-26: qty 16

## 2018-02-26 MED ORDER — FUROSEMIDE 10 MG/ML IJ SOLN
40.0000 mg | Freq: Once | INTRAMUSCULAR | Status: AC
  Administered 2018-02-26: 40 mg via INTRAVENOUS
  Filled 2018-02-26: qty 4

## 2018-02-26 MED ORDER — FENTANYL CITRATE (PF) 100 MCG/2ML IJ SOLN
50.0000 ug | Freq: Once | INTRAMUSCULAR | Status: AC
  Administered 2018-02-26: 50 ug via INTRAVENOUS
  Filled 2018-02-26: qty 2

## 2018-02-26 MED ORDER — SODIUM CHLORIDE 0.9% FLUSH
3.0000 mL | Freq: Two times a day (BID) | INTRAVENOUS | Status: DC
  Administered 2018-02-26 – 2018-02-28 (×4): 3 mL via INTRAVENOUS

## 2018-02-26 MED ORDER — LEVALBUTEROL HCL 0.63 MG/3ML IN NEBU: 0.6300 mg | INHALATION_SOLUTION | RESPIRATORY_TRACT | Status: DC | PRN

## 2018-02-26 MED ORDER — ATORVASTATIN CALCIUM 40 MG PO TABS
40.0000 mg | ORAL_TABLET | Freq: Every day | ORAL | Status: DC
  Administered 2018-02-26 – 2018-02-28 (×3): 40 mg via ORAL
  Filled 2018-02-26 (×3): qty 1

## 2018-02-26 MED ORDER — FAMOTIDINE 20 MG PO TABS
20.0000 mg | ORAL_TABLET | Freq: Two times a day (BID) | ORAL | Status: DC
  Administered 2018-02-26 – 2018-02-28 (×4): 20 mg via ORAL
  Filled 2018-02-26 (×4): qty 1

## 2018-02-26 MED ORDER — SODIUM CHLORIDE 0.9% FLUSH: 3.0000 mL | INTRAVENOUS | Status: DC | PRN

## 2018-02-26 MED ORDER — ACETAMINOPHEN 325 MG PO TABS: 650.0000 mg | ORAL_TABLET | ORAL | Status: DC | PRN

## 2018-02-26 MED ORDER — ONDANSETRON HCL 4 MG/2ML IJ SOLN: 4.0000 mg | Freq: Four times a day (QID) | INTRAMUSCULAR | Status: DC | PRN

## 2018-02-26 MED ORDER — SODIUM CHLORIDE 0.9 % IV SOLN: 250.0000 mL | INTRAVENOUS | Status: DC | PRN

## 2018-02-26 MED ORDER — ENOXAPARIN SODIUM 40 MG/0.4ML ~~LOC~~ SOLN
40.0000 mg | SUBCUTANEOUS | Status: DC
  Administered 2018-02-26: 40 mg via SUBCUTANEOUS
  Filled 2018-02-26: qty 0.4

## 2018-02-26 MED ORDER — IPRATROPIUM BROMIDE 0.02 % IN SOLN: 0.5000 mg | RESPIRATORY_TRACT | Status: DC | PRN

## 2018-02-26 MED ORDER — ALBUTEROL SULFATE (2.5 MG/3ML) 0.083% IN NEBU
3.0000 mL | INHALATION_SOLUTION | Freq: Four times a day (QID) | RESPIRATORY_TRACT | Status: DC | PRN
  Administered 2018-02-27: 3 mL via RESPIRATORY_TRACT
  Filled 2018-02-26 (×2): qty 3

## 2018-02-26 MED ORDER — UMECLIDINIUM BROMIDE 62.5 MCG/INH IN AEPB
1.0000 | INHALATION_SPRAY | Freq: Every day | RESPIRATORY_TRACT | Status: DC
  Administered 2018-02-27 – 2018-02-28 (×2): 1 via RESPIRATORY_TRACT
  Filled 2018-02-26: qty 7

## 2018-02-26 MED ORDER — SPIRONOLACTONE 25 MG PO TABS
25.0000 mg | ORAL_TABLET | Freq: Every day | ORAL | Status: DC
  Administered 2018-02-26 – 2018-02-28 (×3): 25 mg via ORAL
  Filled 2018-02-26 (×3): qty 1

## 2018-02-26 MED ORDER — ASPIRIN EC 81 MG PO TBEC
81.0000 mg | DELAYED_RELEASE_TABLET | Freq: Every day | ORAL | Status: DC
  Administered 2018-02-26 – 2018-02-28 (×3): 81 mg via ORAL
  Filled 2018-02-26 (×3): qty 1

## 2018-02-26 MED ORDER — LISINOPRIL 20 MG PO TABS
20.0000 mg | ORAL_TABLET | Freq: Every day | ORAL | Status: DC
  Administered 2018-02-26 – 2018-02-28 (×3): 20 mg via ORAL
  Filled 2018-02-26 (×3): qty 1

## 2018-02-26 MED ORDER — AMLODIPINE BESYLATE 5 MG PO TABS
5.0000 mg | ORAL_TABLET | Freq: Every day | ORAL | Status: DC
  Administered 2018-02-26: 5 mg via ORAL
  Filled 2018-02-26: qty 1

## 2018-02-26 MED ORDER — FUROSEMIDE 10 MG/ML IJ SOLN
40.0000 mg | Freq: Two times a day (BID) | INTRAMUSCULAR | Status: DC
  Administered 2018-02-26 – 2018-02-28 (×4): 40 mg via INTRAVENOUS
  Filled 2018-02-26 (×4): qty 4

## 2018-02-26 NOTE — ED Provider Notes (Signed)
[facility] DEPT Provider Note   CSN: [id number] Arrival date & time: 02/26/18  5885     History   Chief Complaint Chief Complaint  Patient presents with  . Shortness of Breath    HPI Sabrina Holland is a 49 y.o. female hx of breast cancer, COPD, HTN, TIA, presenting with shortness of breath, weight gain.  She states that she was seen here last week with shortness of breath and was noted to have elevated BNP.  She was thought to have COPD and CHF exacerbation.  Patient states that she never filled her prescription for steroids.  She also admits to using cocaine and subsequently has worsening shortness of breath as well as gained about 6 pounds.  She states that her legs are more swollen as well.  She states that she also ran out of albuterol and has not been using it.  Of note, patient has multiple admissions for COPD and CHF exacerbation.  The history is provided by the patient.    Past Medical History:  Diagnosis Date  . Breast cancer ([place])   . Breast cancer, stage 2 ([place]) 02/23/2011  . COPD (chronic obstructive pulmonary disease) ([place])   . Hypertension   . TIA (transient ischemic attack) 11/25/2011   "first time" (11/25/2011)    Patient Active Problem List   Diagnosis Date Noted  . Dyspnea 02/26/2018  . Acute on chronic systolic CHF (congestive heart failure) ([place]) 01/20/2018  . Acute on chronic systolic and diastolic heart failure, NYHA class 3 ([place]) 01/20/2018  . Cocaine abuse ([place]) 12/19/2017  . Mixed hyperlipidemia 12/19/2017  . COPD exacerbation ([place])   . Acute on chronic respiratory failure with hypoxemia ([place]) 12/03/2017  . Cardiomyopathy ([place]) 07/04/2017  . COPD (chronic obstructive pulmonary disease) ([place])   . Chronic combined systolic and diastolic heart failure ([place])   . Anemia 06/20/2017  . Polysubstance abuse ([place]) 11/26/2011  . TIA (transient ischemic attack) 11/25/2011  . Essential hypertension 11/25/2011  . Sinusitis  11/25/2011  . Vitamin B 12 deficiency 07/18/2011  . History of breast cancer 02/23/2011    Past Surgical History:  Procedure Laterality Date  . BREAST BIOPSY  2007   left  . MASTECTOMY  09/2005   left  . TONSILLECTOMY AND ADENOIDECTOMY     "when I was little" (11/25/2011)  . TUBAL LIGATION  1993     OB History    Gravida  5   Para  4   Term  4   Preterm  0   AB  1   Living        SAB  1   TAB  0   Ectopic  0   Multiple      Live Births               Home Medications    Prior to Admission medications   Medication Sig Start Date End Date Taking? Authorizing Provider  albuterol (PROVENTIL HFA) 108 (90 Base) MCG/ACT inhaler Inhale 1-2 puffs into the lungs every 6 (six) hours as needed for wheezing or shortness of breath. Use 2 puffs 3 times daily x 3 days, then every 6 hours as needed. 01/21/18   Nita Sells, MD  amLODipine (NORVASC) 5 MG tablet Take 1 tablet (5 mg total) by mouth daily. 12/04/17   Eugenie Filler, MD  aspirin EC 81 MG EC tablet Take 1 tablet (81 mg total) by mouth daily. 06/24/17   Reyne Dumas, MD  atorvastatin (LIPITOR)  40 MG tablet Take 1 tablet (40 mg total) by mouth daily at 6 PM. 12/19/17   Croitoru, Mihai, MD  budesonide-formoterol (SYMBICORT) 80-4.5 MCG/ACT inhaler Inhale 2 puffs into the lungs 2 (two) times daily. 01/21/18   Nita Sells, MD  famotidine (PEPCID) 20 MG tablet Take 1 tablet (20 mg total) by mouth 2 (two) times daily. 12/04/17   Eugenie Filler, MD  fluticasone (FLONASE) 50 MCG/ACT nasal spray Place 2 sprays into both nostrils daily. 12/05/17   Eugenie Filler, MD  furosemide (LASIX) 20 MG tablet Take 2 tablets (40 mg total) by mouth 2 (two) times daily. 01/21/18   Nita Sells, MD  lisinopril (PRINIVIL,ZESTRIL) 20 MG tablet Take 1 tablet (20 mg total) by mouth daily. 12/05/17   Eugenie Filler, MD  nicotine (NICODERM CQ - DOSED IN MG/24 HOURS) 14 mg/24hr patch Place 1 patch (14 mg total) onto  the skin daily. Patient not taking: Reported on 02/19/2018 01/22/18   Nita Sells, MD  predniSONE (DELTASONE) 20 MG tablet Take 2 tablets (40 mg total) by mouth daily with breakfast. For the next four days 02/19/18   Carmin Muskrat, MD  spironolactone (ALDACTONE) 25 MG tablet Take 1 tablet (25 mg total) by mouth daily. 12/04/17   Eugenie Filler, MD  tiotropium (SPIRIVA HANDIHALER) 18 MCG inhalation capsule Place 1 capsule (18 mcg total) into inhaler and inhale daily. 01/21/18 01/21/19  Nita Sells, MD    Family History Family History  Problem Relation Age of Onset  . COPD Father   . Sickle cell anemia Cousin        maternal cousin    Social History Social History   Tobacco Use  . Smoking status: Current Some Day Smoker    Packs/day: 0.00    Years: 20.00    Pack years: 0.00    Types: Cigarettes  . Smokeless tobacco: Never Used  . Tobacco comment: 1 or 2 cigarettes a day- approx 1 pack week  Substance Use Topics  . Alcohol use: Yes    Alcohol/week: 12.0 standard drinks    Types: 12 Cans of beer per week    Comment: occasionally  . Drug use: Yes    Types: Marijuana, Cocaine    Comment: patient states she uses cocaine and marijuana daily     Allergies   Penicillins   Review of Systems Review of Systems  Respiratory: Positive for shortness of breath.   All other systems reviewed and are negative.    Physical Exam Updated Vital Signs BP (!) 133/102   Pulse (!) 117   Temp (!) 97.5 F (36.4 C) (Oral)   Resp 17   Ht 5\' 5"  (1.651 m)   Wt 58 kg   LMP 02/18/2015 (Exact Date) Comment: MAYBE ONCE A YEAR  SpO2 96%   BMI 21.28 kg/m   Physical Exam Vitals signs and nursing note reviewed.  Constitutional:      Appearance: She is well-developed.  HENT:     Head: Normocephalic.     Mouth/Throat:     Mouth: Mucous membranes are moist.  Eyes:     Pupils: Pupils are equal, round, and reactive to light.  Neck:     Musculoskeletal: Normal range of motion.   Cardiovascular:     Rate and Rhythm: Normal rate.  Pulmonary:     Comments: Tachypneic, minimal wheezing throughout, bibasilar crackles  Abdominal:     General: Bowel sounds are normal.     Palpations: Abdomen is soft.  Comments: Mild L CVAT   Musculoskeletal: Normal range of motion.     Comments: Trace edema bilateral legs   Skin:    General: Skin is warm.     Capillary Refill: Capillary refill takes less than 2 seconds.  Neurological:     General: No focal deficit present.     Mental Status: She is alert.  Psychiatric:        Mood and Affect: Mood normal.        Behavior: Behavior normal.      ED Treatments / Results  Labs (all labs ordered are listed, but only abnormal results are displayed) Labs Reviewed  CBC WITH DIFFERENTIAL/PLATELET - Abnormal; Notable for the following components:      Result Value   MCH 25.1 (*)    RDW 16.8 (*)    Platelets 402 (*)    All other components within normal limits  BASIC METABOLIC PANEL - Abnormal; Notable for the following components:   Glucose, Bld 107 (*)    Creatinine, Ser 1.06 (*)    All other components within normal limits  BRAIN NATRIURETIC PEPTIDE - Abnormal; Notable for the following components:   B Natriuretic Peptide 2,027.2 (*)    All other components within normal limits  RAPID URINE DRUG SCREEN, HOSP PERFORMED - Abnormal; Notable for the following components:   Cocaine POSITIVE (*)    Tetrahydrocannabinol POSITIVE (*)    All other components within normal limits  URINALYSIS, ROUTINE W REFLEX MICROSCOPIC - Abnormal; Notable for the following components:   Color, Urine STRAW (*)    Leukocytes, UA SMALL (*)    All other components within normal limits  I-STAT TROPONIN, ED    EKG EKG Interpretation  Date/Time:  Monday February 26 2018 09:44:39 EST Ventricular Rate:  114 PR Interval:    QRS Duration: 81 QT Interval:  348 QTC Calculation: 480 R Axis:   106 Text Interpretation:  Sinus tachycardia Probable  left atrial enlargement Right axis deviation Consider left ventricular hypertrophy Probable lateral infarct, age indeterminate Nonspecific T abnormalities, lateral leads No significant change since last tracing Confirmed by Wandra Arthurs [phone]) on 02/26/2018 9:46:51 AM Also confirmed by Wandra Arthurs [phone]), editor [place], Jeannetta Nap [phone])  on 02/26/2018 12:27:25 PM   Radiology Dg Chest 2 View  Result Date: 02/26/2018 CLINICAL DATA:  Increased shortness of breath. EXAM: CHEST - 2 VIEW COMPARISON:  02/19/2018 FINDINGS: Diffuse mild bilateral interstitial thickening. No focal consolidation, pleural effusion or pneumothorax. Stable cardiomegaly. No acute osseous abnormality. IMPRESSION: Cardiomegaly with mild pulmonary vascular congestion. Electronically Signed   By: Kathreen Devoid   On: 02/26/2018 11:10    Procedures Procedures (including critical care time)  Medications Ordered in ED Medications  albuterol (PROVENTIL) (2.5 MG/3ML) 0.083% nebulizer solution 5 mg (5 mg Nebulization Given 02/26/18 1006)  ipratropium (ATROVENT) nebulizer solution 0.5 mg (0.5 mg Nebulization Given 02/26/18 1007)  methylPREDNISolone sodium succinate (SOLU-MEDROL) 125 mg/2 mL injection 125 mg (125 mg Intravenous Given 02/26/18 1001)  furosemide (LASIX) injection 40 mg (40 mg Intravenous Given 02/26/18 1001)  fentaNYL (SUBLIMAZE) injection 50 mcg (50 mcg Intravenous Given 02/26/18 1032)     Initial Impression / Assessment and Plan / ED Course  I have reviewed the triage vital signs and the nursing notes.  Pertinent labs & imaging results that were available during my care of the patient were reviewed by me and considered in my medical decision making (see chart for details).    Sabrina Holland is a 48  y.o. female here with SOB, leg swelling, weight gain, L flank pain. I think likely COPD vs CHF exacerbation. Also consider renal colic as well. Will get labs, BNP, CXR, UA, US renal. Will give steroids, nebs, lasix.     12:50 PM BNP 2000. Cocaine positive in UDS (admits to cocaine use). Given nebs, steroids, lasix. Will admit for COPD/ CHF exacerbation.   Final Clinical Impressions(s) / ED Diagnoses   Final diagnoses:  None    ED Discharge Orders    None       Drenda Freeze, MD 02/26/18 1251

## 2018-02-26 NOTE — ED Triage Notes (Signed)
Pt BIBA from work. Pt c/o SHOB since Saturday. Hx of CHF, COPD. No relief with home inhaler.  Pt has noticed some edema in her feet and had 6 lb weight gain.

## 2018-02-26 NOTE — ED Notes (Signed)
ED TO INPATIENT HANDOFF REPORT  Name/Age/Gender Sabrina Holland 48 y.o. female  Code Status Code Status History    Date Active Date Inactive Code Status Order ID Comments User Context   01/20/2018 0823 01/21/2018 1419 Full Code [id number]  Edwin Dada, MD Inpatient   12/03/2017 0513 12/04/2017 2309 Full Code [id number]  Elwyn Reach, MD Inpatient   06/20/2017 1946 06/23/2017 1555 Full Code [id number]  Eugenie Filler, MD Inpatient   11/25/2011 0818 11/26/2011 1559 Full Code [id number]  Oti, Brantley Stage, MD ED      Home/SNF/Other Home  Chief Complaint sob  Level of Care/Admitting Diagnosis ED Disposition    ED Disposition Condition [place]: [facility] [100102]  Level of Care: Telemetry [5]  Admit to tele based on following criteria: Acute CHF  Diagnosis: Dyspnea [416606]  Admitting Physician: Eugenie Filler [[id number]]  Attending Physician: Eugenie Filler [[id number]]  Estimated length of stay: past midnight tomorrow  Certification:: I certify this patient will need inpatient services for at least 2 midnights  PT Class (Do Not Modify): Inpatient [101]  PT Acc Code (Do Not Modify): Private [1]       Medical History Past Medical History:  Diagnosis Date  . Breast cancer ([place])   . Breast cancer, stage 2 ([place]) 02/23/2011  . COPD (chronic obstructive pulmonary disease) ([place])   . Hypertension   . TIA (transient ischemic attack) 11/25/2011   "first time" (11/25/2011)    Allergies Allergies  Allergen Reactions  . Penicillins Other (See Comments)    Pt had slight throat swelling Has patient had a PCN reaction causing immediate rash, facial/tongue/throat swelling, SOB or lightheadedness with hypotension: yes Has patient had a PCN reaction causing severe rash involving mucus membranes or skin necrosis: No Has patient had a PCN reaction that required hospitalization No Has patient had a PCN reaction occurring within the last 10  years: No If all of the above answers are "NO", then may proceed with Cephalosporin use.     IV Location/Drains/Wounds Patient Lines/Drains/Airways Status   Active Line/Drains/Airways    Name:   Placement date:   Placement time:   Site:   Days:   Peripheral IV 02/26/18 Right Forearm   02/26/18    1003    Forearm   less than 1          Labs/Imaging Results for orders placed or performed during the hospital encounter of 02/26/18 (from the past 48 hour(s))  CBC with Differential/Platelet     Status: Abnormal   Collection Time: 02/26/18  9:58 AM  Result Value Ref Range   WBC 7.3 4.0 - 10.5 K/uL   RBC 4.82 3.87 - 5.11 MIL/uL   Hemoglobin 12.1 12.0 - 15.0 g/dL   HCT 39.7 36.0 - 46.0 %   MCV 82.4 80.0 - 100.0 fL   MCH 25.1 (L) 26.0 - 34.0 pg   MCHC 30.5 30.0 - 36.0 g/dL   RDW 16.8 (H) 11.5 - 15.5 %   Platelets 402 (H) 150 - 400 K/uL   nRBC 0.0 0.0 - 0.2 %   Neutrophils Relative % 48 %   Neutro Abs 3.5 1.7 - 7.7 K/uL   Lymphocytes Relative 37 %   Lymphs Abs 2.7 0.7 - 4.0 K/uL   Monocytes Relative 11 %   Monocytes Absolute 0.8 0.1 - 1.0 K/uL   Eosinophils Relative 3 %   Eosinophils Absolute 0.2 0.0 - 0.5 K/uL   Basophils  Relative 1 %   Basophils Absolute 0.0 0.0 - 0.1 K/uL   Immature Granulocytes 0 %   Abs Immature Granulocytes 0.02 0.00 - 0.07 K/uL    Comment: Performed at [facility], [place] [address], [place], [place] [address]  Basic metabolic panel     Status: Abnormal   Collection Time: 02/26/18  9:58 AM  Result Value Ref Range   Sodium 138 135 - 145 mmol/L   Potassium 4.4 3.5 - 5.1 mmol/L   Chloride 104 98 - 111 mmol/L   CO2 25 22 - 32 mmol/L   Glucose, Bld 107 (H) 70 - 99 mg/dL   BUN 16 6 - 20 mg/dL   Creatinine, Ser 1.06 (H) 0.44 - 1.00 mg/dL   Calcium 9.3 8.9 - 10.3 mg/dL   GFR calc non Af Amer >60 >60 mL/min   GFR calc Af Amer >60 >60 mL/min   Anion gap 9 5 - 15    Comment: Performed at [facility], [place] [address],  [place], [place] [address]  Brain natriuretic peptide     Status: Abnormal   Collection Time: 02/26/18  9:59 AM  Result Value Ref Range   B Natriuretic Peptide 2,027.2 (H) 0.0 - 100.0 pg/mL    Comment: Performed at [facility], [place] [address], [place], [place] [address]  I-stat troponin, ED     Status: None   Collection Time: 02/26/18 10:03 AM  Result Value Ref Range   Troponin i, poc 0.00 0.00 - 0.08 ng/mL   Comment 3            Comment: Due to the release kinetics of cTnI, a negative result within the first hours of the onset of symptoms does not rule out myocardial infarction with certainty. If myocardial infarction is still suspected, repeat the test at appropriate intervals.   Rapid urine drug screen (hospital performed)     Status: Abnormal   Collection Time: 02/26/18 10:32 AM  Result Value Ref Range   Opiates NONE DETECTED NONE DETECTED   Cocaine POSITIVE (A) NONE DETECTED   Benzodiazepines NONE DETECTED NONE DETECTED   Amphetamines NONE DETECTED NONE DETECTED   Tetrahydrocannabinol POSITIVE (A) NONE DETECTED   Barbiturates NONE DETECTED NONE DETECTED    Comment: (NOTE) DRUG SCREEN FOR MEDICAL PURPOSES ONLY.  IF CONFIRMATION IS NEEDED FOR ANY PURPOSE, NOTIFY LAB WITHIN 5 DAYS. LOWEST DETECTABLE LIMITS FOR URINE DRUG SCREEN Drug Class                     Cutoff (ng/mL) Amphetamine and metabolites    1000 Barbiturate and metabolites    200 Benzodiazepine                 462 Tricyclics and metabolites     300 Opiates and metabolites        300 Cocaine and metabolites        300 THC                            50 Performed at [facility], [place] [address], [place], [place] [address]   Urinalysis, Routine w reflex microscopic     Status: Abnormal   Collection Time: 02/26/18 10:32 AM  Result Value Ref Range   Color, Urine STRAW (A) YELLOW   APPearance CLEAR CLEAR   Specific Gravity, Urine 1.005 1.005 - 1.030   pH 5.0 5.0 - 8.0  Glucose, UA NEGATIVE NEGATIVE mg/dL   Hgb urine dipstick NEGATIVE NEGATIVE   Bilirubin Urine NEGATIVE NEGATIVE   Ketones, ur NEGATIVE NEGATIVE mg/dL   Protein, ur NEGATIVE NEGATIVE mg/dL   Nitrite NEGATIVE NEGATIVE   Leukocytes, UA SMALL (A) NEGATIVE   Bacteria, UA NONE SEEN NONE SEEN   Squamous Epithelial / LPF 0-5 0 - 5   Mucus PRESENT     Comment: Performed at [facility] [address], [place], [place] [address]   Dg Chest 2 View  Result Date: 02/26/2018 CLINICAL DATA:  Increased shortness of breath. EXAM: CHEST - 2 VIEW COMPARISON:  02/19/2018 FINDINGS: Diffuse mild bilateral interstitial thickening. No focal consolidation, pleural effusion or pneumothorax. Stable cardiomegaly. No acute osseous abnormality. IMPRESSION: Cardiomegaly with mild pulmonary vascular congestion. Electronically Signed   By: Kathreen Devoid   On: 02/26/2018 11:10    Pending Labs Unresulted Labs (From admission, onward)   None      Vitals/Pain Today's Vitals   02/26/18 1230 02/26/18 1239 02/26/18 1300 02/26/18 1309  BP: (!) 133/102  (!) 138/97   Pulse: (!) 117 (!) 117 (!) 111   Resp: (!) 26 17 (!) 23   Temp:      TempSrc:      SpO2: 96% 96% 95%   Weight:      Height:      PainSc:    0-No pain    Isolation Precautions No active isolations  Medications Medications  albuterol (PROVENTIL) (2.5 MG/3ML) 0.083% nebulizer solution 5 mg (5 mg Nebulization Given 02/26/18 1006)  ipratropium (ATROVENT) nebulizer solution 0.5 mg (0.5 mg Nebulization Given 02/26/18 1007)  methylPREDNISolone sodium succinate (SOLU-MEDROL) 125 mg/2 mL injection 125 mg (125 mg Intravenous Given 02/26/18 1001)  furosemide (LASIX) injection 40 mg (40 mg Intravenous Given 02/26/18 1001)  fentaNYL (SUBLIMAZE) injection 50 mcg (50 mcg Intravenous Given 02/26/18 1032)    Mobility walks

## 2018-02-26 NOTE — ED Notes (Signed)
Bed: XN17 Expected date:  Expected time:  Means of arrival:  Comments: EMS 47  COPD/CHF SOB

## 2018-02-26 NOTE — H&P (Addendum)
History and Physical    Sabrina Holland IWP:[id number] DOB: Dec 28, 1970 DOA: 02/26/2018  PCP: Gildardo Pounds, NP  Patient coming from: Home  I have personally briefly reviewed patient's old medical records in [place]  Chief Complaint: Shortness of breath  HPI: Sabrina Holland is a 48 y.o. female with medical history significant of chronic combined systolic and diastolic heart failure with a EF of 25 to 30% with diffuse hypokinesis per 2D echo of 12/03/2017, history of polysubstance abuse with cocaine, COPD, hypertension, history of tobacco use, remote history of breast cancer not treated with anthracyclines, who presents to the ED with a 2-day history of worsening shortness of breath, 6 pound weight gain, diffuse wheezing.  Patient does endorse use of cocaine 2 days prior to admission which she states she used after her daughter had a miscarriage.  Patient also endorses some chills, bloated feeling, lower extremity edema, lightheadedness, constipation.  Patient stated she used albuterol and Spiriva inhalers with no significant improvement.  Patient denies any fever, no melena, no hematemesis, no hematochezia, no dysuria, no emesis, no diarrhea, no visual changes, no asymmetric weakness or numbness.  Patient stated that due to her worsening shortness of breath she was sent to the ED from work today.  Patient states has not smoked a cigarette in about 15 days.  ED Course: Patient seen in the ED, BNP which was done was elevated at 2027.2, CBC which was done had a platelet count of 402 otherwise was within normal limits.  Basic metabolic profile done was within normal limits.  Urinalysis done had small leukocytes, nitrite negative, no bacteria.  UDS was positive for cocaine and THC.  Chest x-ray done showed cardiomegaly with mild pulmonary vascular congestion.  Patient given 40 mg of IV Lasix, Atrovent and albuterol nebs, IV Solu-Medrol in the ED.  Hospitalist were called to admit  the patient for further evaluation and management.  Review of Systems: As per HPI otherwise 10 point review of systems negative.   Past Medical History:  Diagnosis Date  . Breast cancer ([place])   . Breast cancer, stage 2 ([place]) 02/23/2011  . COPD (chronic obstructive pulmonary disease) ([place])   . Hypertension   . TIA (transient ischemic attack) 11/25/2011   "first time" (11/25/2011)    Past Surgical History:  Procedure Laterality Date  . BREAST BIOPSY  2007   left  . MASTECTOMY  09/2005   left  . TONSILLECTOMY AND ADENOIDECTOMY     "when I was little" (11/25/2011)  . TUBAL LIGATION  1993     reports that she has been smoking cigarettes. She has been smoking about 0.00 packs per day for the past 20.00 years. She has never used smokeless tobacco. She reports current alcohol use of about 12.0 standard drinks of alcohol per week. She reports current drug use. Drugs: Marijuana and Cocaine.  Allergies  Allergen Reactions  . Penicillins Other (See Comments)    Pt had slight throat swelling Has patient had a PCN reaction causing immediate rash, facial/tongue/throat swelling, SOB or lightheadedness with hypotension: yes Has patient had a PCN reaction causing severe rash involving mucus membranes or skin necrosis: No Has patient had a PCN reaction that required hospitalization No Has patient had a PCN reaction occurring within the last 10 years: No If all of the above answers are "NO", then may proceed with Cephalosporin use.     Family History  Problem Relation Age of Onset  . COPD Father   . Sickle  cell anemia Cousin        maternal cousin   Mother deceased age 45 cause unknown per patient.  Father deceased age 64 from COPD.  Prior to Admission medications   Medication Sig Start Date End Date Taking? Authorizing Provider  albuterol (PROVENTIL HFA) 108 (90 Base) MCG/ACT inhaler Inhale 1-2 puffs into the lungs every 6 (six) hours as needed for wheezing or shortness of breath. Use 2 puffs  3 times daily x 3 days, then every 6 hours as needed. 01/21/18   Nita Sells, MD  amLODipine (NORVASC) 5 MG tablet Take 1 tablet (5 mg total) by mouth daily. 12/04/17   Eugenie Filler, MD  aspirin EC 81 MG EC tablet Take 1 tablet (81 mg total) by mouth daily. 06/24/17   Reyne Dumas, MD  atorvastatin (LIPITOR) 40 MG tablet Take 1 tablet (40 mg total) by mouth daily at 6 PM. 12/19/17   Croitoru, Dani Gobble, MD  budesonide-formoterol (SYMBICORT) 80-4.5 MCG/ACT inhaler Inhale 2 puffs into the lungs 2 (two) times daily. 01/21/18   Nita Sells, MD  famotidine (PEPCID) 20 MG tablet Take 1 tablet (20 mg total) by mouth 2 (two) times daily. 12/04/17   Eugenie Filler, MD  fluticasone (FLONASE) 50 MCG/ACT nasal spray Place 2 sprays into both nostrils daily. 12/05/17   Eugenie Filler, MD  furosemide (LASIX) 20 MG tablet Take 2 tablets (40 mg total) by mouth 2 (two) times daily. 01/21/18   Nita Sells, MD  lisinopril (PRINIVIL,ZESTRIL) 20 MG tablet Take 1 tablet (20 mg total) by mouth daily. 12/05/17   Eugenie Filler, MD  nicotine (NICODERM CQ - DOSED IN MG/24 HOURS) 14 mg/24hr patch Place 1 patch (14 mg total) onto the skin daily. Patient not taking: Reported on 02/19/2018 01/22/18   Nita Sells, MD  predniSONE (DELTASONE) 20 MG tablet Take 2 tablets (40 mg total) by mouth daily with breakfast. For the next four days 02/19/18   Carmin Muskrat, MD  spironolactone (ALDACTONE) 25 MG tablet Take 1 tablet (25 mg total) by mouth daily. 12/04/17   Eugenie Filler, MD  tiotropium (SPIRIVA HANDIHALER) 18 MCG inhalation capsule Place 1 capsule (18 mcg total) into inhaler and inhale daily. 01/21/18 01/21/19  Nita Sells, MD    Physical Exam: Vitals:   02/26/18 1230 02/26/18 1239 02/26/18 1300 02/26/18 1337  BP: (!) 133/102  (!) 138/97 (!) 135/93  Pulse: (!) 117 (!) 117 (!) 111 (!) 124  Resp: (!) 26 17 (!) 23 (!) 25  Temp:      TempSrc:      SpO2: 96% 96% 95% 97%  Weight:       Height:        Constitutional: NAD, calm, comfortable Vitals:   02/26/18 1230 02/26/18 1239 02/26/18 1300 02/26/18 1337  BP: (!) 133/102  (!) 138/97 (!) 135/93  Pulse: (!) 117 (!) 117 (!) 111 (!) 124  Resp: (!) 26 17 (!) 23 (!) 25  Temp:      TempSrc:      SpO2: 96% 96% 95% 97%  Weight:      Height:       Eyes: PERRL, lids and conjunctivae normal ENMT: Mucous membranes are moist.  Poor dentition.  Posterior pharynx clear of any exudate or lesions. Neck: normal, supple, no masses, no thyromegaly Respiratory: Diffuse crackles noted.  No wheezing noted.  No rhonchi.  No use of accessory muscles of respiration.  Speaking in full sentences.   Cardiovascular: Regular rate and rhythm, no  murmurs / rubs / gallops.  Positive JVD.  Trace to 1+ bilateral lower extremity edema.  No carotid bruits. Abdomen: no tenderness, no masses palpated. No hepatosplenomegaly. Bowel sounds positive.  Musculoskeletal: no clubbing / cyanosis. No joint deformity upper and lower extremities. Good ROM, no contractures. Normal muscle tone.  Skin: no rashes, lesions, ulcers. No induration Neurologic: CN 2-12 grossly intact. Sensation intact, DTR normal. Strength 5/5 in all 4.  Psychiatric: Normal judgment and insight. Alert and oriented x 3. Normal mood.   Labs on Admission: I have personally reviewed following labs and imaging studies  CBC: Recent Labs  Lab 02/26/18 0958  WBC 7.3  NEUTROABS 3.5  HGB 12.1  HCT 39.7  MCV 82.4  PLT 628*   Basic Metabolic Panel: Recent Labs  Lab 02/26/18 0958  NA 138  K 4.4  CL 104  CO2 25  GLUCOSE 107*  BUN 16  CREATININE 1.06*  CALCIUM 9.3   GFR: Estimated Creatinine Clearance: 57.8 mL/min (A) (by C-G formula based on SCr of 1.06 mg/dL (H)). Liver Function Tests: No results for input(s): AST, ALT, ALKPHOS, BILITOT, PROT, ALBUMIN in the last 168 hours. No results for input(s): LIPASE, AMYLASE in the last 168 hours. No results for input(s): AMMONIA in the  last 168 hours. Coagulation Profile: No results for input(s): INR, PROTIME in the last 168 hours. Cardiac Enzymes: No results for input(s): CKTOTAL, CKMB, CKMBINDEX, TROPONINI in the last 168 hours. BNP (last 3 results) No results for input(s): PROBNP in the last 8760 hours. HbA1C: No results for input(s): HGBA1C in the last 72 hours. CBG: No results for input(s): GLUCAP in the last 168 hours. Lipid Profile: No results for input(s): CHOL, HDL, LDLCALC, TRIG, CHOLHDL, LDLDIRECT in the last 72 hours. Thyroid Function Tests: No results for input(s): TSH, T4TOTAL, FREET4, T3FREE, THYROIDAB in the last 72 hours. Anemia Panel: No results for input(s): VITAMINB12, FOLATE, FERRITIN, TIBC, IRON, RETICCTPCT in the last 72 hours. Urine analysis:    Component Value Date/Time   COLORURINE STRAW (A) 02/26/2018 1032   APPEARANCEUR CLEAR 02/26/2018 1032   LABSPEC 1.005 02/26/2018 1032   LABSPEC 1.015 11/29/2007 1119   PHURINE 5.0 02/26/2018 1032   GLUCOSEU NEGATIVE 02/26/2018 1032   HGBUR NEGATIVE 02/26/2018 [place] 02/26/2018 1032   BILIRUBINUR negative 09/04/2017 1217   BILIRUBINUR Negative 11/29/2007 1119   KETONESUR NEGATIVE 02/26/2018 1032   PROTEINUR NEGATIVE 02/26/2018 1032   UROBILINOGEN 0.2 09/04/2017 1217   UROBILINOGEN 0.2 03/02/2009 0856   NITRITE NEGATIVE 02/26/2018 1032   LEUKOCYTESUR SMALL (A) 02/26/2018 1032   LEUKOCYTESUR Negative 11/29/2007 1119    Radiological Exams on Admission: Dg Chest 2 View  Result Date: 02/26/2018 CLINICAL DATA:  Increased shortness of breath. EXAM: CHEST - 2 VIEW COMPARISON:  02/19/2018 FINDINGS: Diffuse mild bilateral interstitial thickening. No focal consolidation, pleural effusion or pneumothorax. Stable cardiomegaly. No acute osseous abnormality. IMPRESSION: Cardiomegaly with mild pulmonary vascular congestion. Electronically Signed   By: Kathreen Devoid   On: 02/26/2018 11:10    EKG: Independently reviewed.  Left atrial  enlargement.  Right axis deviation.  LVH.  Assessment/Plan Principal Problem:   Dyspnea Active Problems:   Acute on chronic systolic and diastolic heart failure, NYHA class 3 (HCC)   Vitamin B 12 deficiency   Essential hypertension   Polysubstance abuse (HCC)   COPD (chronic obstructive pulmonary disease) (HCC)   Cocaine abuse (HCC)   1 dyspnea secondary to acute on chronic combined systolic and diastolic heart failure./Cardiomyopathy Patient  presented with dyspnea and shortness of breath.  Patient does endorse compliance with her medications.  Patient noted to use cocaine recently as of 2 days prior to admission.  BNP done on presentation to the ED was elevated at 2027.2.  Point-of-care troponin negative.  EKG done showed left atrial enlargement, right axis deviation, LVH.  Patient given a dose of Lasix 40 mg IV x1 in the ED in addition to IV Solu-Medrol and nebulizer treatments.  Cycle cardiac enzymes every 6 hours x3.  Check a TSH.  Check a magnesium level.  Placed on Lasix 40 mg IV every 12 hours.  Strict I's and O's.  Daily weights.  Patient with recent 2D echo done in November 2019 and as such we will not repeat.  Due to patient's poor EF will have cardiology assess patient for further evaluation and management.  Due to patient's ongoing cocaine use patient unable to be placed on a beta-blocker.  Follow for now.  2.  Hypertension Patient being placed on IV Lasix due to acute on chronic CHF exacerbation.  Will resume home regimen of Norvasc 5 mg daily, lisinopril 20 mg daily.  Follow.  3.  Polysubstance abuse Cessation of polysubstance abuse stressed to patient.  Patient states had gone a few weeks without cocaine use however use some cocaine 2 days prior to admission as her daughter had had a miscarriage.  Social work consult.  4.  COPD Currently stable.  Will place on Dulera and Spiriva.  Nebs as needed.   DVT prophylaxis: Lovenox Code Status: Full Family Communication: Updated  patient.  No family at bedside. Disposition Plan: Likely home once clinically improved. Consults called: [place] cardiology Admission status: Admit to inpatient.   Irine Seal MD [facility]  If 7PM-7AM, please contact night-coverage [email or web address] Password [email or web address]  02/26/2018, 1:41 PM

## 2018-02-26 NOTE — ED Notes (Signed)
ED TO INPATIENT HANDOFF REPORT  Name/Age/Gender Sabrina Holland 48 y.o. female  Code Status Code Status History    Date Active Date Inactive Code Status Order ID Comments User Context   01/20/2018 0823 01/21/2018 1419 Full Code [id number]  Edwin Dada, MD Inpatient   12/03/2017 0513 12/04/2017 2309 Full Code [id number]  Elwyn Reach, MD Inpatient   06/20/2017 1946 06/23/2017 1555 Full Code [id number]  Eugenie Filler, MD Inpatient   11/25/2011 0818 11/26/2011 1559 Full Code [id number]  Oti, Brantley Stage, MD ED      Home/SNF/Other Home  Chief Complaint sob  Level of Care/Admitting Diagnosis ED Disposition    ED Disposition Condition [place]: [facility] [[id number]]  Level of Care: Telemetry [5]  Admit to tele based on following criteria: Acute CHF  Diagnosis: Dyspnea [202542]  Admitting Physician: Eugenie Filler [[id number]]  Attending Physician: Eugenie Filler [[id number]]  Estimated length of stay: past midnight tomorrow  Certification:: I certify this patient will need inpatient services for at least 2 midnights  PT Class (Do Not Modify): Inpatient [101]  PT Acc Code (Do Not Modify): Private [1]       Medical History Past Medical History:  Diagnosis Date  . Breast cancer ([place])   . Breast cancer, stage 2 ([place]) 02/23/2011  . COPD (chronic obstructive pulmonary disease) ([place])   . Hypertension   . TIA (transient ischemic attack) 11/25/2011   "first time" (11/25/2011)    Allergies Allergies  Allergen Reactions  . Penicillins Other (See Comments)    Pt had slight throat swelling Has patient had a PCN reaction causing immediate rash, facial/tongue/throat swelling, SOB or lightheadedness with hypotension: yes Has patient had a PCN reaction causing severe rash involving mucus membranes or skin necrosis: No Has patient had a PCN reaction that required hospitalization No Has patient had a PCN reaction occurring within the last 10  years: No If all of the above answers are "NO", then may proceed with Cephalosporin use.     IV Location/Drains/Wounds Patient Lines/Drains/Airways Status   Active Line/Drains/Airways    Name:   Placement date:   Placement time:   Site:   Days:   Peripheral IV 02/26/18 Right Forearm   02/26/18    1003    Forearm   less than 1          Labs/Imaging Results for orders placed or performed during the hospital encounter of 02/26/18 (from the past 48 hour(s))  TSH     Status: None   Collection Time: 02/26/18  9:57 AM  Result Value Ref Range   TSH 0.485 0.350 - 4.500 uIU/mL    Comment: Performed by a 3rd Generation assay with a functional sensitivity of <=0.01 uIU/mL. Performed at [facility], [place] [address], [place], [place] [address]   CBC with Differential/Platelet     Status: Abnormal   Collection Time: 02/26/18  9:58 AM  Result Value Ref Range   WBC 7.3 4.0 - 10.5 K/uL   RBC 4.82 3.87 - 5.11 MIL/uL   Hemoglobin 12.1 12.0 - 15.0 g/dL   HCT 39.7 36.0 - 46.0 %   MCV 82.4 80.0 - 100.0 fL   MCH 25.1 (L) 26.0 - 34.0 pg   MCHC 30.5 30.0 - 36.0 g/dL   RDW 16.8 (H) 11.5 - 15.5 %   Platelets 402 (H) 150 - 400 K/uL   nRBC 0.0 0.0 - 0.2 %   Neutrophils Relative %  48 %   Neutro Abs 3.5 1.7 - 7.7 K/uL   Lymphocytes Relative 37 %   Lymphs Abs 2.7 0.7 - 4.0 K/uL   Monocytes Relative 11 %   Monocytes Absolute 0.8 0.1 - 1.0 K/uL   Eosinophils Relative 3 %   Eosinophils Absolute 0.2 0.0 - 0.5 K/uL   Basophils Relative 1 %   Basophils Absolute 0.0 0.0 - 0.1 K/uL   Immature Granulocytes 0 %   Abs Immature Granulocytes 0.02 0.00 - 0.07 K/uL    Comment: Performed at [facility], [place] [address], [place], [place] [address]  Basic metabolic panel     Status: Abnormal   Collection Time: 02/26/18  9:58 AM  Result Value Ref Range   Sodium 138 135 - 145 mmol/L   Potassium 4.4 3.5 - 5.1 mmol/L   Chloride 104 98 - 111 mmol/L   CO2 25 22 - 32 mmol/L    Glucose, Bld 107 (H) 70 - 99 mg/dL   BUN 16 6 - 20 mg/dL   Creatinine, Ser 1.06 (H) 0.44 - 1.00 mg/dL   Calcium 9.3 8.9 - 10.3 mg/dL   GFR calc non Af Amer >60 >60 mL/min   GFR calc Af Amer >60 >60 mL/min   Anion gap 9 5 - 15    Comment: Performed at [facility], [place] [address] [place], [place] [address]  Brain natriuretic peptide     Status: Abnormal   Collection Time: 02/26/18  9:59 AM  Result Value Ref Range   B Natriuretic Peptide 2,027.2 (H) 0.0 - 100.0 pg/mL    Comment: Performed at [facility], [place] [address] [place], [place] [address]  I-stat troponin, ED     Status: None   Collection Time: 02/26/18 10:03 AM  Result Value Ref Range   Troponin i, poc 0.00 0.00 - 0.08 ng/mL   Comment 3            Comment: Due to the release kinetics of cTnI, a negative result within the first hours of the onset of symptoms does not rule out myocardial infarction with certainty. If myocardial infarction is still suspected, repeat the test at appropriate intervals.   Rapid urine drug screen (hospital performed)     Status: Abnormal   Collection Time: 02/26/18 10:32 AM  Result Value Ref Range   Opiates NONE DETECTED NONE DETECTED   Cocaine POSITIVE (A) NONE DETECTED   Benzodiazepines NONE DETECTED NONE DETECTED   Amphetamines NONE DETECTED NONE DETECTED   Tetrahydrocannabinol POSITIVE (A) NONE DETECTED   Barbiturates NONE DETECTED NONE DETECTED    Comment: (NOTE) DRUG SCREEN FOR MEDICAL PURPOSES ONLY.  IF CONFIRMATION IS NEEDED FOR ANY PURPOSE, NOTIFY LAB WITHIN 5 DAYS. LOWEST DETECTABLE LIMITS FOR URINE DRUG SCREEN Drug Class                     Cutoff (ng/mL) Amphetamine and metabolites    1000 Barbiturate and metabolites    200 Benzodiazepine                 998 Tricyclics and metabolites     300 Opiates and metabolites        300 Cocaine and metabolites        300 THC                            50 Performed at [facility], [address] [place], [place] [address]   Urinalysis, Routine w reflex microscopic     Status: Abnormal   Collection Time: 02/26/18 10:32 AM  Result Value Ref Range   Color, Urine STRAW (A) YELLOW   APPearance CLEAR CLEAR   Specific Gravity, Urine 1.005 1.005 - 1.030   pH 5.0 5.0 - 8.0   Glucose, UA NEGATIVE NEGATIVE mg/dL   Hgb urine dipstick NEGATIVE NEGATIVE   Bilirubin Urine NEGATIVE NEGATIVE   Ketones, ur NEGATIVE NEGATIVE mg/dL   Protein, ur NEGATIVE NEGATIVE mg/dL   Nitrite NEGATIVE NEGATIVE   Leukocytes, UA SMALL (A) NEGATIVE   Bacteria, UA NONE SEEN NONE SEEN   Squamous Epithelial / LPF 0-5 0 - 5   Mucus PRESENT     Comment: Performed at [facility], [place] [address], [place], [place] [address]  Troponin I - Now Then Q6H     Status: None   Collection Time: 02/26/18  2:27 PM  Result Value Ref Range   Troponin I <0.03 <0.03 ng/mL    Comment: Performed at [facility], [place] [address] [place],  [address]   Dg Chest 2 View  Result Date: 02/26/2018 CLINICAL DATA:  Increased shortness of breath. EXAM: CHEST - 2 VIEW COMPARISON:  02/19/2018 FINDINGS: Diffuse mild bilateral interstitial thickening. No focal consolidation, pleural effusion or pneumothorax. Stable cardiomegaly. No acute osseous abnormality. IMPRESSION: Cardiomegaly with mild pulmonary vascular congestion. Electronically Signed   By: Kathreen Devoid   On: 02/26/2018 11:10   US Renal  Result Date: 02/26/2018 CLINICAL DATA:  Shortness of breath.  Left flank pain. EXAM: RENAL / URINARY TRACT ULTRASOUND COMPLETE COMPARISON:  None. FINDINGS: Right Kidney: Renal measurements: 10.6 x 4.1 x 3.1 cm = volume: 84.3 mL . Echogenicity within normal limits. No mass or hydronephrosis visualized. Left Kidney: Renal measurements: 10.4 x 4.7 x 5.2 cm = volume: 134.6 mL. Echogenicity within normal limits. No mass or hydronephrosis visualized. Bladder: Partially distended urinary  bladder with wall thickening of up to 8 mm. Small amount of free fluid is noted adjacent to the urinary bladder. IMPRESSION: Urinary bladder wall thickening of uncertain etiology. Further evaluation with focused ultrasound of the urinary bladder, when fully distended may be considered, if further imaging evaluation required. Small amount of free fluid within the pelvis adjacent to the urinary bladder. Normal appearance of the kidneys. Electronically Signed   By: Fidela Salisbury M.D.   On: 02/26/2018 13:43   EKG Interpretation  Date/Time:  Monday February 26 2018 09:44:39 EST Ventricular Rate:  114 PR Interval:    QRS Duration: 81 QT Interval:  348 QTC Calculation: 480 R Axis:   106 Text Interpretation:  Sinus tachycardia Probable left atrial enlargement Right axis deviation Consider left ventricular hypertrophy Probable lateral infarct, age indeterminate Nonspecific T abnormalities, lateral leads No significant change since last tracing Confirmed by Wandra Arthurs [phone]) on 02/26/2018 9:46:51 AM Also confirmed by Wandra Arthurs [phone]), editor Lynder Parents [phone])  on 02/26/2018 12:27:25 PM   Pending Labs Unresulted Labs (From admission, onward)    Start     Ordered   02/27/18 0500  CBC  Tomorrow morning,   R     02/26/18 1553   02/27/18 8366  Basic metabolic panel  Tomorrow morning,   R     02/26/18 1553   02/26/18 1332  Troponin I - Now Then Q6H  Now then every 6 hours,   R     02/26/18 1331  Signed and Held  Basic metabolic panel  Daily,   R     Signed and Held   Signed and Held  Magnesium  Add-on,   R     Signed and Held          Vitals/Pain Today's Vitals   02/26/18 1400 02/26/18 1430 02/26/18 1501 02/26/18 1702  BP: (!) 135/105 (!) 120/99 (!) 138/98 (!) 136/100  Pulse: (!) 111 (!) 107 (!) 108 (!) 114  Resp: (!) 21 17 (!) 21 (!) 26  Temp:      TempSrc:      SpO2: 100% 97% 98% 98%  Weight:      Height:      PainSc:        Isolation Precautions No active  isolations  Medications Medications  furosemide (LASIX) injection 40 mg (has no administration in time range)  aspirin EC tablet 81 mg (has no administration in time range)  atorvastatin (LIPITOR) tablet 40 mg (has no administration in time range)  lisinopril (PRINIVIL,ZESTRIL) tablet 20 mg (has no administration in time range)  spironolactone (ALDACTONE) tablet 25 mg (has no administration in time range)  famotidine (PEPCID) tablet 20 mg (has no administration in time range)  mometasone-formoterol (DULERA) 100-5 MCG/ACT inhaler 2 puff (has no administration in time range)  fluticasone (FLONASE) 50 MCG/ACT nasal spray 2 spray (has no administration in time range)  tiotropium (SPIRIVA) inhalation capsule ([facility] use ONLY) 18 mcg (18 mcg Inhalation Not Given 02/26/18 1600)  enoxaparin (LOVENOX) injection 40 mg (has no administration in time range)  levalbuterol (XOPENEX) nebulizer solution 0.63 mg (has no administration in time range)  ipratropium (ATROVENT) nebulizer solution 0.5 mg (has no administration in time range)  albuterol (PROVENTIL) (2.5 MG/3ML) 0.083% nebulizer solution 5 mg (5 mg Nebulization Given 02/26/18 1006)  ipratropium (ATROVENT) nebulizer solution 0.5 mg (0.5 mg Nebulization Given 02/26/18 1007)  methylPREDNISolone sodium succinate (SOLU-MEDROL) 125 mg/2 mL injection 125 mg (125 mg Intravenous Given 02/26/18 1001)  furosemide (LASIX) injection 40 mg (40 mg Intravenous Given 02/26/18 1001)  fentaNYL (SUBLIMAZE) injection 50 mcg (50 mcg Intravenous Given 02/26/18 1032)    Mobility walks

## 2018-02-27 ENCOUNTER — Telehealth: Payer: Self-pay

## 2018-02-27 ENCOUNTER — Telehealth: Payer: Self-pay | Admitting: Nurse Practitioner

## 2018-02-27 DIAGNOSIS — E440 Moderate protein-calorie malnutrition: Secondary | ICD-10-CM

## 2018-02-27 LAB — BASIC METABOLIC PANEL
Anion gap: 12 (ref 5–15)
BUN: 16 mg/dL (ref 6–20)
CO2: 27 mmol/L (ref 22–32)
CREATININE: 0.93 mg/dL (ref 0.44–1.00)
Calcium: 8.9 mg/dL (ref 8.9–10.3)
Chloride: 99 mmol/L (ref 98–111)
GFR calc Af Amer: >60 mL/min (ref >60)
GFR calc non Af Amer: >60 mL/min (ref >60)
Glucose, Bld: 143 mg/dL — ABNORMAL HIGH (ref 70–99)
Potassium: 3.7 mmol/L (ref 3.5–5.1)
Sodium: 138 mmol/L (ref 135–145)

## 2018-02-27 LAB — CBC
HCT: 38.1 % (ref 36.0–46.0)
HEMOGLOBIN: 12.2 g/dL (ref 12.0–15.0)
MCH: 26 pg (ref 26.0–34.0)
MCHC: 32 g/dL (ref 30.0–36.0)
MCV: 81.1 fL (ref 80.0–100.0)
Platelets: 392 10*3/uL (ref 150–400)
RBC: 4.7 MIL/uL (ref 3.87–5.11)
RDW: 16.6 % — ABNORMAL HIGH (ref 11.5–15.5)
WBC: 5.8 10*3/uL (ref 4.0–10.5)
nRBC: 0 % (ref 0.0–0.2)

## 2018-02-27 LAB — TROPONIN I: Troponin I: <0.03 ng/mL (ref <0.03)

## 2018-02-27 MED ORDER — ALBUTEROL SULFATE (2.5 MG/3ML) 0.083% IN NEBU
2.5000 mg | INHALATION_SOLUTION | Freq: Every day | RESPIRATORY_TRACT | Status: DC
  Administered 2018-02-28: 2.5 mg via RESPIRATORY_TRACT
  Filled 2018-02-27: qty 3

## 2018-02-27 MED ORDER — METOPROLOL TARTRATE 50 MG PO TABS: 50.0000 mg | ORAL_TABLET | Freq: Once | ORAL | Status: DC

## 2018-02-27 MED ORDER — ENSURE ENLIVE PO LIQD: 237.0000 mL | Freq: Two times a day (BID) | ORAL | Status: DC

## 2018-02-27 MED ORDER — ENSURE ENLIVE PO LIQD
237.0000 mL | ORAL | Status: DC
  Administered 2018-02-27: 237 mL via ORAL

## 2018-02-27 MED ORDER — FLUTICASONE PROPIONATE 50 MCG/ACT NA SUSP
2.0000 | Freq: Every day | NASAL | Status: DC | PRN
  Filled 2018-02-27: qty 16

## 2018-02-27 MED ORDER — METOPROLOL TARTRATE 50 MG PO TABS: 50.0000 mg | ORAL_TABLET | ORAL | Status: DC

## 2018-02-27 NOTE — Progress Notes (Signed)
Coronary CT ordered for tomorrow. Lopressor 50 mg ordered for tomorrow to be given prior to [facility].   She needs 18 g PIV in [facility] or higher.

## 2018-02-27 NOTE — Telephone Encounter (Signed)
Call received from Purcell Mouton, RN CM inquiring about assistance for patient to obtain medications. Informed her that the patient will need to submit the applications for the [facility] and can work with the [facility] . She can also work with the [facility] for medication assistance but she will need to take the initiative to complete the applications.   An appointment has been scheduled for her with her PCP on 2/17/20202 @ 1050

## 2018-02-27 NOTE — Telephone Encounter (Signed)
I spoke with Case Manager in referent of the Financial information that this Pt need to bring when she schedule an appt with [facility] financial.

## 2018-02-27 NOTE — Progress Notes (Signed)
Consulted with Sabrina Holland with IV team - unable to start IV at this time, no 18g needles available.  States she will pass on to oncoming RN, and could possibly have done at [facility] tomorrow.  Will notify oncoming night RN

## 2018-02-27 NOTE — Consult Note (Signed)
Cardiology Consultation:   Patient ID: Sabrina Holland MRN: [id number]; DOB: [birth year]  Admit date: 02/26/2018 Date of Consult: 02/27/2018  Primary Care Provider: Gildardo Pounds, NP Primary Cardiologist: Sanda Klein, MD  Primary Electrophysiologist:  None    Patient Profile:   Sabrina Holland is a 48 y.o. female with a hx of chronic systolic and diastolic heart failure, HTN, hx of TIA, hx of tobacco use, polysubstance abuse (cocaine), COPD, and hx of breast cancer who is being seen today for the evaluation of CHF exacerbation at the request of Dr. Grandville Silos.  History of Present Illness:   Sabrina Holland was recently discharged on 01/21/18 from a 1 day hospitalization for acute on chronic CHF. Her BNP on admission was >2000 with recent cocaine and THC use. She was treated with 2 doses of IV lasix and then insisted on going home despite being told she needed more diuresis. She was instructed to follow up with cardiology OP. Her last OP cardiology clinic visit was 12/19/17. At that time, she had recently been hospitalized with CHF exacerbation requiring brief BiPAP, steroids, and bronchodilators as her exacerbation was thought to be due to COPD exacerbation. At her follow up, it was noted that she has significant barriers to self-care, including homelessness and recent denial for medicaid.   She presented back to [facility] with lower extremity edema, abdominal bloating, and SOB. BNP is elevated > 2000. CXR with cardiomegaly and mild pulmonary vascular congestion. Troponin has been negative.  UDS positive for cocaine and THC.   Home regimen includes ASA, statin, norvasc, lisinopril, and aldactone. Lasix has been 40 mg BID.  On my interview, she is participating in her care. She is compliant on her medications; however, her boss at [employer] hasn't been allowing her to take frequent bathroom breaks. Because of this, she stopped taking her morning lasix last week. Instead, she was taking a  dose at 2pm, after work, and then sometimes again before bed. This was problematic because she was up all night voiding. She denies recent illness and she last used cocaine on Friday. Symptoms started on Thursday with SOB and abodminal fullness. This progressed to orthopnea and swelling in her thighs. Her dry weight at home is 128 lbs and by Saturday she was up to 136 lbs. She was unable to sleep flat Sunday night. She reported to work on Monday and was sent to the ED for SOB. She has received IV lasix and is feeling better. She no longer has orthopnea and SOB, but her abdomen feels tight. We discussed PRN lasix when she had a weight gain. We also discussed proper weighing techniques and logs at home.    Past Medical History:  Diagnosis Date  . Breast cancer ([place])   . Breast cancer, stage 2 ([place]) 02/23/2011  . COPD (chronic obstructive pulmonary disease) ([place])   . Hypertension   . TIA (transient ischemic attack) 11/25/2011   "first time" (11/25/2011)    Past Surgical History:  Procedure Laterality Date  . BREAST BIOPSY  2007   left  . MASTECTOMY  09/2005   left  . TONSILLECTOMY AND ADENOIDECTOMY     "when I was little" (11/25/2011)  . TUBAL LIGATION  1993     Home Medications:  Prior to Admission medications   Medication Sig Start Date End Date Taking? Authorizing Provider  albuterol (PROVENTIL HFA) 108 (90 Base) MCG/ACT inhaler Inhale 1-2 puffs into the lungs every 6 (six) hours as needed for wheezing or shortness of breath. Use  2 puffs 3 times daily x 3 days, then every 6 hours as needed. 01/21/18  Yes Nita Sells, MD  amLODipine (NORVASC) 5 MG tablet Take 1 tablet (5 mg total) by mouth daily. 12/04/17  Yes Eugenie Filler, MD  aspirin EC 81 MG EC tablet Take 1 tablet (81 mg total) by mouth daily. 06/24/17  Yes Reyne Dumas, MD  atorvastatin (LIPITOR) 40 MG tablet Take 1 tablet (40 mg total) by mouth daily at 6 PM. 12/19/17  Yes Croitoru, Mihai, MD  budesonide-formoterol  (SYMBICORT) 80-4.5 MCG/ACT inhaler Inhale 2 puffs into the lungs 2 (two) times daily. 01/21/18  Yes Nita Sells, MD  famotidine (PEPCID) 20 MG tablet Take 1 tablet (20 mg total) by mouth 2 (two) times daily. Patient taking differently: Take 20 mg by mouth 2 (two) times daily as needed for heartburn.  12/04/17  Yes Eugenie Filler, MD  fluticasone [facility]) 50 MCG/ACT nasal spray Place 2 sprays into both nostrils daily. 12/05/17  Yes Eugenie Filler, MD  furosemide (LASIX) 20 MG tablet Take 2 tablets (40 mg total) by mouth 2 (two) times daily. 01/21/18  Yes Nita Sells, MD  lisinopril (PRINIVIL,ZESTRIL) 20 MG tablet Take 1 tablet (20 mg total) by mouth daily. 12/05/17  Yes Eugenie Filler, MD  Multiple Vitamin (MULTIVITAMIN WITH MINERALS) TABS tablet Take 1 tablet by mouth daily.   Yes [provider]  nicotine (NICODERM CQ - DOSED IN MG/24 HOURS) 14 mg/24hr patch Place 1 patch (14 mg total) onto the skin daily. 01/22/18  Yes Nita Sells, MD  spironolactone (ALDACTONE) 25 MG tablet Take 1 tablet (25 mg total) by mouth daily. 12/04/17  Yes Eugenie Filler, MD  tiotropium (SPIRIVA HANDIHALER) 18 MCG inhalation capsule Place 1 capsule (18 mcg total) into inhaler and inhale daily. 01/21/18 01/21/19 Yes Nita Sells, MD  predniSONE (DELTASONE) 20 MG tablet Take 2 tablets (40 mg total) by mouth daily with breakfast. For the next four days 02/19/18   Carmin Muskrat, MD    Inpatient Medications: Scheduled Meds: . amLODipine  5 mg Oral Daily  . aspirin EC  81 mg Oral Daily  . atorvastatin  40 mg Oral q1800  . enoxaparin (LOVENOX) injection  40 mg Subcutaneous Q24H  . famotidine  20 mg Oral BID  . fluticasone  2 spray Each Nare Daily  . furosemide  40 mg Intravenous Q12H  . lisinopril  20 mg Oral Daily  . mometasone-formoterol  2 puff Inhalation BID  . sodium chloride flush  3 mL Intravenous Q12H  . spironolactone  25 mg Oral Daily  . umeclidinium bromide   1 puff Inhalation Daily   Continuous Infusions: . sodium chloride     PRN Meds: sodium chloride, acetaminophen, albuterol, ALPRAZolam, ondansetron (ZOFRAN) IV, sodium chloride flush  Allergies:    Allergies  Allergen Reactions  . Penicillins Other (See Comments)    Pt had slight throat swelling Has patient had a PCN reaction causing immediate rash, facial/tongue/throat swelling, SOB or lightheadedness with hypotension: yes Has patient had a PCN reaction causing severe rash involving mucus membranes or skin necrosis: No Has patient had a PCN reaction that required hospitalization No Has patient had a PCN reaction occurring within the last 10 years: No If all of the above answers are "NO", then may proceed with Cephalosporin use.     Social History:   Social History   Socioeconomic History  . Marital status: Single    Spouse name: Not on file  . Number  of children: 4  . Years of education: Not on file  . Highest education level: Not on file  Occupational History    Employer: [employer]  Social Needs  . Financial resource strain: Not on file  . Food insecurity:    Worry: Not on file    Inability: Not on file  . Transportation needs:    Medical: Not on file    Non-medical: Not on file  Tobacco Use  . Smoking status: Current Some Day Smoker    Packs/day: 0.00    Years: 20.00    Pack years: 0.00    Types: Cigarettes  . Smokeless tobacco: Never Used  . Tobacco comment: 1 or 2 cigarettes a day- approx 1 pack week  Substance and Sexual Activity  . Alcohol use: Yes    Alcohol/week: 12.0 standard drinks    Types: 12 Cans of beer per week    Comment: occasionally  . Drug use: Yes    Types: Marijuana, Cocaine    Comment: patient states she uses cocaine and marijuana daily  . Sexual activity: Not Currently    Birth control/protection: Surgical  Lifestyle  . Physical activity:    Days per week: Not on file    Minutes per session: Not on file  . Stress: Not on file    Relationships  . Social connections:    Talks on phone: Not on file    Gets together: Not on file    Attends religious service: Not on file    Active member of club or organization: Not on file    Attends meetings of clubs or organizations: Not on file    Relationship status: Not on file  . Intimate partner violence:    Fear of current or ex partner: Not on file    Emotionally abused: Not on file    Physically abused: Not on file    Forced sexual activity: Not on file  Other Topics Concern  . Not on file  Social History Narrative   ** Merged History Encounter **        Family History:    Family History  Problem Relation Age of Onset  . COPD Father   . Sickle cell anemia Cousin        maternal cousin     ROS:  Please see the history of present illness.   All other ROS reviewed and negative.     Physical Exam/Data:   Vitals:   02/26/18 2016 02/26/18 2142 02/27/18 0500 02/27/18 0613  BP: (!) 137/96 (!) 137/100  109/76  Pulse: (!) 109   100  Resp:    18  Temp:    97.8 F (36.6 C)  TempSrc:    Oral  SpO2: 100%   95%  Weight:   58.1 kg   Height:        Intake/Output Summary (Last 24 hours) at 02/27/2018 0755 Last data filed at 02/27/2018 0500 Gross per 24 hour  Intake 118 ml  Output 1050 ml  Net -932 ml   Last 3 Weights 02/27/2018 02/26/2018 02/26/2018  Weight (lbs) 128 lb 1.6 oz 135 lb 8 oz 127 lb 13.9 oz  Weight (kg) 58.106 kg 61.462 kg 58 kg     Body mass index is 21.32 kg/m.  General:  Well nourished, well developed, in no acute distress HEENT: normal Neck: no JVD Vascular: No carotid bruits Cardiac:  normal S1, S2; RRR; no murmur  Lungs:  clear to auscultation bilaterally upper lobes, diminished in  bases Abd: soft, nontender, no hepatomegaly  Ext: thigh edema Musculoskeletal:  No deformities, BUE and BLE strength normal and equal Skin: warm and dry  Neuro:  CNs 2-12 intact, no focal abnormalities noted Psych:  Normal affect   EKG:  The EKG was  personally reviewed and demonstrates:  Sinus, LVH Telemetry:  Telemetry was personally reviewed and demonstrates:  Sinus, PVCs  Relevant CV Studies:  Echo 12/03/17: Study Conclusions - Left ventricle: The cavity size was mildly dilated. Wall   thickness was normal. Systolic function was severely reduced. The   estimated ejection fraction was in the range of 25% to 30%.   Diffuse hypokinesis. - Mitral valve: There was mild regurgitation. - Left atrium: The atrium was mildly dilated. - Atrial septum: There was increased thickness of the septum,   consistent with lipomatous hypertrophy. - Pulmonary arteries: PA peak pressure: 43 mm Hg (S). - Impressions: EF similar to that estimated in June 2019.  Impressions: - EF similar to that estimated in June 2019.  Laboratory Data:  Chemistry Recent Labs  Lab 02/26/18 0958 02/27/18 0115  NA 138 138  K 4.4 3.7  CL 104 99  CO2 25 27  GLUCOSE 107* 143*  BUN 16 16  CREATININE 1.06* 0.93  CALCIUM 9.3 8.9  GFRNONAA >60 >60  GFRAA >60 >60  ANIONGAP 9 12    No results for input(s): PROT, ALBUMIN, AST, ALT, ALKPHOS, BILITOT in the last 168 hours. Hematology Recent Labs  Lab 02/26/18 0958 02/27/18 0115  WBC 7.3 5.8  RBC 4.82 4.70  HGB 12.1 12.2  HCT 39.7 38.1  MCV 82.4 81.1  MCH 25.1* 26.0  MCHC 30.5 32.0  RDW 16.8* 16.6*  PLT 402* 392   Cardiac Enzymes Recent Labs  Lab 02/26/18 1427 02/26/18 1905 02/27/18 0115  TROPONINI <0.03 <0.03 <0.03    Recent Labs  Lab 02/26/18 1003  TROPIPOC 0.00    BNP Recent Labs  Lab 02/26/18 0959  BNP 2,027.2*    DDimer No results for input(s): DDIMER in the last 168 hours.  Radiology/Studies:  Dg Chest 2 View  Result Date: 02/26/2018 CLINICAL DATA:  Increased shortness of breath. EXAM: CHEST - 2 VIEW COMPARISON:  02/19/2018 FINDINGS: Diffuse mild bilateral interstitial thickening. No focal consolidation, pleural effusion or pneumothorax. Stable cardiomegaly. No acute osseous  abnormality. IMPRESSION: Cardiomegaly with mild pulmonary vascular congestion. Electronically Signed   By: Kathreen Devoid   On: 02/26/2018 11:10   US Renal  Result Date: 02/26/2018 CLINICAL DATA:  Shortness of breath.  Left flank pain. EXAM: RENAL / URINARY TRACT ULTRASOUND COMPLETE COMPARISON:  None. FINDINGS: Right Kidney: Renal measurements: 10.6 x 4.1 x 3.1 cm = volume: 84.3 mL . Echogenicity within normal limits. No mass or hydronephrosis visualized. Left Kidney: Renal measurements: 10.4 x 4.7 x 5.2 cm = volume: 134.6 mL. Echogenicity within normal limits. No mass or hydronephrosis visualized. Bladder: Partially distended urinary bladder with wall thickening of up to 8 mm. Small amount of free fluid is noted adjacent to the urinary bladder. IMPRESSION: Urinary bladder wall thickening of uncertain etiology. Further evaluation with focused ultrasound of the urinary bladder, when fully distended may be considered, if further imaging evaluation required. Small amount of free fluid within the pelvis adjacent to the urinary bladder. Normal appearance of the kidneys. Electronically Signed   By: Fidela Salisbury M.D.   On: 02/26/2018 13:43    Assessment and Plan:   1. Acute on chronic systolic and diastolic heart failure - EF 25-30% -  she has received 40 mg IV lasix x 2 - she has had significant improvement in her breathing and abdominal fullness, still with abdominal tightness no exam - continue IV lasix for today, transition to PO dosing tomorrow - she can likely be discharged on her home lasix of 40 mg BID with clear instructions for PRN lasix dosing for weight gain of greater than 3 lbs in 1 day - she will require close follow up in clinic - continue 20 gm lisinopril, 25 mg aldactone - will discuss with attending switching norvasc for hydralazine   2. Polysubstance abuse - positive for cocaine and THC - beta blocker has been held from her regimen with ongoing cocaine use - last used on  Friday - daughter had a miscarriage   3. HTN - pressures have been controlled - consider D/C norvasc given her EF  4. HLD - on statin - 06/21/2017: Cholesterol 185; HDL 93; LDL Cholesterol 80; Triglycerides 59; VLDL 12   5. COPD - continues to smoke - inhalers per primary - dulera and spiriva with PRN nebs - she did not have the money to pick up her steroid prescription after her last discharge      For questions or updates, please contact [place] Please consult [email or web address] for contact info under     Signed, Ledora Bottcher, PA  02/27/2018 7:55 AM

## 2018-02-27 NOTE — Care Management Note (Signed)
Case Management Note  Patient Details  Name: Cyleigh Massaro MRN: [id number] Date of Birth: 09-15-1970  Subjective/Objective:                    Action/Plan:Spoke with pt concerning Medications. Pt use [facility] for her medications, which is cheaper that any other pharmacy. Pt's neb machine from [facility]. CHF screening, Pt has PCP, scales and is not home bound. Pt will need to make an appointment with Eligibility coordinator for assist with her medications. Pt is aware of this information. Hospital follow up with [facility] on  March 20, 2018 at 0830 AM.   Expected Discharge Date:  (unknown)               Expected Discharge Plan:  Home/Self Care  In-House Referral:     Discharge planning Services  CM Consult, Follow-up appt scheduled  Post Acute Care Choice:    Choice offered to:     DME Arranged:  Nebulizer machine DME Agency:  [place]:    [facility] Agency:     Status of Service:  Completed, signed off  If discussed at [place] of Stay Meetings, dates discussed:    Additional CommentsPurcell Mouton, RN 02/27/2018, 3:11 PM

## 2018-02-27 NOTE — Progress Notes (Signed)
PROGRESS NOTE    Sabrina Holland  ZTI:[id number] DOB: Jan 26, 1970 DOA: 02/26/2018 PCP: Gildardo Pounds, NP   Brief Narrative:  48 year old female history of chronic combined systolic diastolic heart failure EF 25 to 30% with diffuse hypokinesis per 2D echo 12/03/2017, history of ongoing polysubstance abuse with crack cocaine, COPD, hypertension, tobacco use, history of remote breast cancer not treated with anthracyclines presented to the ED with a 2-day history of worsening shortness of breath, 6 pound weight gain and diffuse wheezing.  Patient noted to be in acute on chronic combined systolic diastolic heart failure exacerbation.  Patient admitted and placed on IV diuretics, lisinopril, aldactone.  Due to patient's significantly depressed EF, cardiac history cardiology consulted.   Assessment & Plan:   Principal Problem:   Dyspnea Active Problems:   Acute on chronic systolic and diastolic heart failure, NYHA class 3 (HCC)   Vitamin B 12 deficiency   Essential hypertension   Polysubstance abuse (HCC)   COPD (chronic obstructive pulmonary disease) (HCC)   Cocaine abuse (HCC)  1 dyspnea secondary to acute on chronic combined systolic and diastolic heart failure./Cardiomyopathy Patient presented with dyspnea and shortness of breath.  Patient does endorse compliance with her medications.  Patient works at [employer] and usually eats some of her meals over there may be leading to dietary indiscretion. Patient noted to use cocaine recently as of 2 days prior to admission.  BNP done on presentation to the ED was elevated at 2027.2.  Point-of-care troponin negative.  EKG done showed left atrial enlargement, right axis deviation, LVH.  Patient given a dose of Lasix 40 mg IV x1 in the ED in addition to IV Solu-Medrol and nebulizer treatments.  Cardiac enzymes negative x3.   TSH within normal limits at 0.485.  Magnesium level at 2.1.  Patient with urine output of 1.050 L since admission.  Patient  with recent 2D echo done in November 2019 and as such we will not repeat.  Due to patient's poor EF will have cardiology assess patient for further evaluation and management.  Due to patient's ongoing cocaine use patient unable to be placed on a beta-blocker.   2.  Hypertension Patient being placed on IV Lasix due to acute on chronic CHF exacerbation.  Will discontinue Norvasc to give more room for diuresis.  Continue lisinopril and Aldactone and IV Lasix.  3.  Polysubstance abuse Cessation of polysubstance abuse stressed to patient.  Patient states had gone a few weeks without cocaine use however use some cocaine 2 days prior to admission as her daughter had had a miscarriage.  Social work consulted.  4.  COPD Currently stable.  Continue Dulera and Spiriva.  Nebs as needed.    5. protein calorie malnutrition Place on Ensure supplementation.    DVT prophylaxis: Lovenox Code Status: Full Family Communication: Updated patient.  No family at bedside. Disposition Plan: Home when clinically improved and per cardiology.   Consultants:   Cardiology: Dr. Radford Pax 02/27/2018  Procedures:   Chest x-ray 02/26/2018  Renal ultrasound 02/26/2018  Antimicrobials:   None   Subjective: Patient states shortness of breath has improved.  Denies any chest pain.  Tolerating oral intake.  No wheezing.  Objective: Vitals:   02/26/18 2142 02/27/18 0500 02/27/18 0613 02/27/18 0825  BP: (!) 137/100  109/76   Pulse:   100 82  Resp:   18 16  Temp:   97.8 F (36.6 C)   TempSrc:   Oral   SpO2:   95% 96%  Weight:  58.1 kg    Height:        Intake/Output Summary (Last 24 hours) at 02/27/2018 1034 Last data filed at 02/27/2018 6256 Gross per 24 hour  Intake 118 ml  Output 2050 ml  Net -1932 ml   Filed Weights   02/26/18 0935 02/26/18 1800 02/27/18 0500  Weight: 58 kg 61.5 kg 58.1 kg    Examination:  General exam: NAD Respiratory system: Diffuse scattered crackles.  No wheezing.   Normal respiratory effort. Cardiovascular system: Regular rate and rhythm no murmurs rubs or gallops.  No JVD.  No lower extremity edema.  Gastrointestinal system: Abdomen is soft, nontender, nondistended, positive bowel sounds.  No rebound.  No guarding. Central nervous system: Alert and oriented. No focal neurological deficits. Extremities: Symmetric 5 x 5 power. Skin: No rashes, lesions or ulcers Psychiatry: Judgement and insight appear normal. Mood & affect appropriate.     Data Reviewed: I have personally reviewed following labs and imaging studies  CBC: Recent Labs  Lab 02/26/18 0958 02/27/18 0115  WBC 7.3 5.8  NEUTROABS 3.5  --   HGB 12.1 12.2  HCT 39.7 38.1  MCV 82.4 81.1  PLT 402* 389   Basic Metabolic Panel: Recent Labs  Lab 02/26/18 0958 02/26/18 1905 02/27/18 0115  NA 138  --  138  K 4.4  --  3.7  CL 104  --  99  CO2 25  --  27  GLUCOSE 107*  --  143*  BUN 16  --  16  CREATININE 1.06*  --  0.93  CALCIUM 9.3  --  8.9  MG  --  2.1  --    GFR: Estimated Creatinine Clearance: 65.8 mL/min (by C-G formula based on SCr of 0.93 mg/dL). Liver Function Tests: No results for input(s): AST, ALT, ALKPHOS, BILITOT, PROT, ALBUMIN in the last 168 hours. No results for input(s): LIPASE, AMYLASE in the last 168 hours. No results for input(s): AMMONIA in the last 168 hours. Coagulation Profile: No results for input(s): INR, PROTIME in the last 168 hours. Cardiac Enzymes: Recent Labs  Lab 02/26/18 1427 02/26/18 1905 02/27/18 0115  TROPONINI <0.03 <0.03 <0.03   BNP (last 3 results) No results for input(s): PROBNP in the last 8760 hours. HbA1C: No results for input(s): HGBA1C in the last 72 hours. CBG: No results for input(s): GLUCAP in the last 168 hours. Lipid Profile: No results for input(s): CHOL, HDL, LDLCALC, TRIG, CHOLHDL, LDLDIRECT in the last 72 hours. Thyroid Function Tests: Recent Labs    02/26/18 0957  TSH 0.485   Anemia Panel: No results for  input(s): VITAMINB12, FOLATE, FERRITIN, TIBC, IRON, RETICCTPCT in the last 72 hours. Sepsis Labs: No results for input(s): PROCALCITON, LATICACIDVEN in the last 168 hours.  No results found for this or any previous visit (from the past 240 hour(s)).       Radiology Studies: Dg Chest 2 View  Result Date: 02/26/2018 CLINICAL DATA:  Increased shortness of breath. EXAM: CHEST - 2 VIEW COMPARISON:  02/19/2018 FINDINGS: Diffuse mild bilateral interstitial thickening. No focal consolidation, pleural effusion or pneumothorax. Stable cardiomegaly. No acute osseous abnormality. IMPRESSION: Cardiomegaly with mild pulmonary vascular congestion. Electronically Signed   By: Kathreen Devoid   On: 02/26/2018 11:10   US Renal  Result Date: 02/26/2018 CLINICAL DATA:  Shortness of breath.  Left flank pain. EXAM: RENAL / URINARY TRACT ULTRASOUND COMPLETE COMPARISON:  None. FINDINGS: Right Kidney: Renal measurements: 10.6 x 4.1 x 3.1 cm = volume: 84.3 mL .  Echogenicity within normal limits. No mass or hydronephrosis visualized. Left Kidney: Renal measurements: 10.4 x 4.7 x 5.2 cm = volume: 134.6 mL. Echogenicity within normal limits. No mass or hydronephrosis visualized. Bladder: Partially distended urinary bladder with wall thickening of up to 8 mm. Small amount of free fluid is noted adjacent to the urinary bladder. IMPRESSION: Urinary bladder wall thickening of uncertain etiology. Further evaluation with focused ultrasound of the urinary bladder, when fully distended may be considered, if further imaging evaluation required. Small amount of free fluid within the pelvis adjacent to the urinary bladder. Normal appearance of the kidneys. Electronically Signed   By: Fidela Salisbury M.D.   On: 02/26/2018 13:43        Scheduled Meds: . [START ON 02/28/2018] albuterol  2.5 mg Nebulization Daily  . aspirin EC  81 mg Oral Daily  . atorvastatin  40 mg Oral q1800  . enoxaparin (LOVENOX) injection  40 mg Subcutaneous  Q24H  . famotidine  20 mg Oral BID  . furosemide  40 mg Intravenous Q12H  . lisinopril  20 mg Oral Daily  . mometasone-formoterol  2 puff Inhalation BID  . sodium chloride flush  3 mL Intravenous Q12H  . spironolactone  25 mg Oral Daily  . umeclidinium bromide  1 puff Inhalation Daily   Continuous Infusions: . sodium chloride       LOS: 1 day    Time spent: 35 minutes    Irine Seal, MD [facility]  If 7PM-7AM, please contact night-coverage [email or web address] 02/27/2018, 10:34 AM

## 2018-02-27 NOTE — Progress Notes (Signed)
Initial Nutrition Assessment  DOCUMENTATION CODES:   Non-severe (moderate) malnutrition in context of social or environmental circumstances  INTERVENTION:    Ensure Enlive po once daily, each supplement provides 350 kcal and 20 grams of protein  Magic cup TID with meals, each supplement provides 290 kcal and 9 grams of protein  NUTRITION DIAGNOSIS:   Moderate Malnutrition related to social / environmental circumstances as evidenced by moderate fat depletion, moderate muscle depletion, severe muscle depletion.  GOAL:   Patient will meet greater than or equal to 90% of their needs   MONITOR:   PO intake, Supplement acceptance, I & O's, Labs, Weight trends  REASON FOR ASSESSMENT:   Consult Diet education  ASSESSMENT:   Patient with PMH significant for CHF, polysubstance abuse, COPD, HTN, breast cancer, and tobacco abuse. Presents this admission with dyspnea secondary to CHF exacerbation.    Pt endorses having a loss in appetite over the last week due to breathing issues. States during this time she ate one meal a day that consisted of beans, chicken, and vegetables. Pt states she is essentially homeless and moves from place to place. She has a hard time affording food but understands the importance of low sodium compliance. Often times she has to ration food options over the span of a month (typically will put dry beans in a crockpot and eats a bowl when she gets home from work). During the last month pt had to split her lasix in half to make it until payday. Discussed the importance of protein intake for preservation of lean body mass. Instructed pt on how to buy high calorie high protein food options on a budget.   Pt reports a UBW of 130 lb and is unsure of how much dry wt she has lost. Records show her weight has fluctuated from 128-139 lb over the last 6 months. Nutrition-Focused physical exam completed.   Medications reviewed and include: 40 mg BID, aldactone Labs reviewed.    NUTRITION - FOCUSED PHYSICAL EXAM:    Most Recent Value  Orbital Region  Moderate depletion  Upper Arm Region  Moderate depletion  Thoracic and Lumbar Region  Moderate depletion  Buccal Region  Moderate depletion  Temple Region  Severe depletion  Clavicle Bone Region  Moderate depletion  Clavicle and Acromion Bone Region  Severe depletion  Scapular Bone Region  Moderate depletion  Dorsal Hand  Mild depletion  Patellar Region  Severe depletion  Anterior Thigh Region  Severe depletion  Posterior Calf Region  Severe depletion  Edema (RD Assessment)  Mild  Hair  Reviewed  Eyes  Reviewed  Mouth  Reviewed  Skin  Reviewed  Nails  Reviewed     Diet order:   Diet Order            Diet NPO time specified Except for: Sips with Meds  Diet effective midnight        Diet Heart Room service appropriate? Yes; Fluid consistency: Thin  Diet effective now              EDUCATION NEEDS:   Education needs have been addressed  Skin:  Skin Assessment: Reviewed RN Assessment  Last BM:  2/9  Height:   Ht Readings from Last 1 Encounters:  02/26/18 5\' 5"  (1.651 m)    Weight:   Wt Readings from Last 1 Encounters:  02/27/18 58.1 kg    Ideal Body Weight:  56.8 kg  BMI:  Body mass index is 21.32 kg/m.  Estimated Nutritional Needs:  Kcal:  1700-1900 kcal  Protein:  85-100 grams  Fluid:  >/= 1.7 L/day   Mariana Single RD, LDN Clinical Nutrition Pager # - [phone]

## 2018-02-28 DIAGNOSIS — I5022 Chronic systolic (congestive) heart failure: Secondary | ICD-10-CM

## 2018-02-28 DIAGNOSIS — E538 Deficiency of other specified B group vitamins: Secondary | ICD-10-CM

## 2018-02-28 DIAGNOSIS — J441 Chronic obstructive pulmonary disease with (acute) exacerbation: Secondary | ICD-10-CM

## 2018-02-28 DIAGNOSIS — E440 Moderate protein-calorie malnutrition: Secondary | ICD-10-CM

## 2018-02-28 LAB — CBC WITH DIFFERENTIAL/PLATELET
Abs Immature Granulocytes: 0.08 10*3/uL — ABNORMAL HIGH (ref 0.00–0.07)
BASOS ABS: 0.1 10*3/uL (ref 0.0–0.1)
Basophils Relative: 1 %
Eosinophils Absolute: 0.2 10*3/uL (ref 0.0–0.5)
Eosinophils Relative: 1 %
HEMATOCRIT: 41 % (ref 36.0–46.0)
Hemoglobin: 12.6 g/dL (ref 12.0–15.0)
Immature Granulocytes: 1 %
Lymphocytes Relative: 31 %
Lymphs Abs: 3.7 10*3/uL (ref 0.7–4.0)
MCH: 24.7 pg — ABNORMAL LOW (ref 26.0–34.0)
MCHC: 30.7 g/dL (ref 30.0–36.0)
MCV: 80.4 fL (ref 80.0–100.0)
Monocytes Absolute: 1.1 10*3/uL — ABNORMAL HIGH (ref 0.1–1.0)
Monocytes Relative: 10 %
Neutro Abs: 6.7 10*3/uL (ref 1.7–7.7)
Neutrophils Relative %: 56 %
Platelets: 420 10*3/uL — ABNORMAL HIGH (ref 150–400)
RBC: 5.1 MIL/uL (ref 3.87–5.11)
RDW: 16.7 % — ABNORMAL HIGH (ref 11.5–15.5)
WBC: 11.9 10*3/uL — ABNORMAL HIGH (ref 4.0–10.5)
nRBC: 0 % (ref 0.0–0.2)

## 2018-02-28 LAB — BASIC METABOLIC PANEL
Anion gap: 12 (ref 5–15)
BUN: 14 mg/dL (ref 6–20)
CO2: 29 mmol/L (ref 22–32)
CREATININE: 0.96 mg/dL (ref 0.44–1.00)
Calcium: 8.7 mg/dL — ABNORMAL LOW (ref 8.9–10.3)
Chloride: 97 mmol/L — ABNORMAL LOW (ref 98–111)
GFR calc Af Amer: >60 mL/min (ref >60)
GFR calc non Af Amer: >60 mL/min (ref >60)
Glucose, Bld: 98 mg/dL (ref 70–99)
Potassium: 2.8 mmol/L — ABNORMAL LOW (ref 3.5–5.1)
Sodium: 138 mmol/L (ref 135–145)

## 2018-02-28 MED ORDER — POTASSIUM CHLORIDE CRYS ER 20 MEQ PO TBCR: 40.0000 meq | EXTENDED_RELEASE_TABLET | Freq: Two times a day (BID) | ORAL | Status: DC

## 2018-02-28 MED ORDER — FUROSEMIDE 40 MG PO TABS
40.0000 mg | ORAL_TABLET | Freq: Two times a day (BID) | ORAL | Status: DC
  Administered 2018-02-28: 40 mg via ORAL
  Filled 2018-02-28: qty 1

## 2018-02-28 MED ORDER — POTASSIUM CHLORIDE 10 MEQ/100ML IV SOLN
10.0000 meq | INTRAVENOUS | Status: AC
  Administered 2018-02-28 (×3): 10 meq via INTRAVENOUS
  Filled 2018-02-28 (×3): qty 100

## 2018-02-28 MED ORDER — POTASSIUM CHLORIDE CRYS ER 20 MEQ PO TBCR: 20.0000 meq | EXTENDED_RELEASE_TABLET | Freq: Every day | ORAL | 0 refills | Status: DC

## 2018-02-28 MED ORDER — POTASSIUM CHLORIDE CRYS ER 20 MEQ PO TBCR
20.0000 meq | EXTENDED_RELEASE_TABLET | Freq: Two times a day (BID) | ORAL | Status: DC
  Administered 2018-02-28: 20 meq via ORAL
  Filled 2018-02-28: qty 1

## 2018-02-28 MED ORDER — MAGNESIUM OXIDE 400 (241.3 MG) MG PO TABS
400.0000 mg | ORAL_TABLET | Freq: Two times a day (BID) | ORAL | Status: DC
  Administered 2018-02-28: 400 mg via ORAL
  Filled 2018-02-28: qty 1

## 2018-02-28 NOTE — Progress Notes (Signed)
Received report from Tyson Babinski, RN. No change from initial pm assessment. Will continue to monitor and follow the POC.

## 2018-02-28 NOTE — Plan of Care (Signed)
  Problem: Education: Goal: Knowledge of disease or condition will improve Outcome: Progressing   Problem: Education: Goal: Knowledge of the prescribed therapeutic regimen will improve Outcome: Progressing   

## 2018-02-28 NOTE — Progress Notes (Signed)
Pt had 5 beats V tach. MD notified. Continue to monitor.

## 2018-02-28 NOTE — Progress Notes (Signed)
Progress Note  Patient Name: Sabrina Holland Date of Encounter: 02/28/2018  Primary Cardiologist: Sanda Klein, MD   Subjective   She feels much better today and thinks her abdomen is not distended/tight. She adamantly refuses CT coronary today stating she hasn't had time to think about it and wants to talk to her kids/wants kids to go with her.  Inpatient Medications    Scheduled Meds: . albuterol  2.5 mg Nebulization Daily  . aspirin EC  81 mg Oral Daily  . atorvastatin  40 mg Oral q1800  . enoxaparin (LOVENOX) injection  40 mg Subcutaneous Q24H  . famotidine  20 mg Oral BID  . feeding supplement (ENSURE ENLIVE)  237 mL Oral BID BM  . furosemide  40 mg Intravenous Q12H  . lisinopril  20 mg Oral Daily  . metoprolol tartrate  50 mg Oral Once  . mometasone-formoterol  2 puff Inhalation BID  . sodium chloride flush  3 mL Intravenous Q12H  . spironolactone  25 mg Oral Daily  . umeclidinium bromide  1 puff Inhalation Daily   Continuous Infusions: . sodium chloride     PRN Meds: sodium chloride, acetaminophen, albuterol, ALPRAZolam, fluticasone, ondansetron (ZOFRAN) IV, sodium chloride flush   Vital Signs    Vitals:   02/27/18 2121 02/27/18 2130 02/28/18 0446 02/28/18 0824  BP: 97/73  101/79   Pulse: 98  98 78  Resp: 18  18 17   Temp: 97.9 F (36.6 C)  98.3 F (36.8 C)   TempSrc: Oral     SpO2: 94% 95% 100% 98%  Weight:   58.5 kg   Height:        Intake/Output Summary (Last 24 hours) at 02/28/2018 0950 Last data filed at 02/28/2018 0301 Gross per 24 hour  Intake 476 ml  Output 2300 ml  Net -1824 ml   Last 3 Weights 02/28/2018 02/27/2018 02/26/2018  Weight (lbs) 128 lb 14.4 oz 128 lb 1.6 oz 135 lb 8 oz  Weight (kg) 58.469 kg 58.106 kg 61.462 kg      Telemetry    Sinus tachycardia in the 100s - Personally Reviewed  ECG    No new tracings - Personally Reviewed  Physical Exam   GEN: No acute distress.   Neck: No JVD Cardiac: regular rhythm,  tachycardic rate, no murmur Respiratory: Clear to auscultation bilaterally. GI: Soft, nontender, non-distended  MS: No edema; No deformity. Neuro:  Nonfocal  Psych: Normal affect   Labs    Chemistry Recent Labs  Lab 02/26/18 0958 02/27/18 0115 02/28/18 0407  NA 138 138 138  K 4.4 3.7 2.8*  CL 104 99 97*  CO2 25 27 29   GLUCOSE 107* 143* 98  BUN 16 16 14   CREATININE 1.06* 0.93 0.96  CALCIUM 9.3 8.9 8.7*  GFRNONAA >60 >60 >60  GFRAA >60 >60 >60  ANIONGAP 9 12 12      Hematology Recent Labs  Lab 02/26/18 0958 02/27/18 0115 02/28/18 0407  WBC 7.3 5.8 11.9*  RBC 4.82 4.70 5.10  HGB 12.1 12.2 12.6  HCT 39.7 38.1 41.0  MCV 82.4 81.1 80.4  MCH 25.1* 26.0 24.7*  MCHC 30.5 32.0 30.7  RDW 16.8* 16.6* 16.7*  PLT 402* 392 420*    Cardiac Enzymes Recent Labs  Lab 02/26/18 1427 02/26/18 1905 02/27/18 0115  TROPONINI <0.03 <0.03 <0.03    Recent Labs  Lab 02/26/18 1003  TROPIPOC 0.00     BNP Recent Labs  Lab 02/26/18 0959  BNP 2,027.2*  DDimer No results for input(s): DDIMER in the last 168 hours.   Radiology    Dg Chest 2 View  Result Date: 02/26/2018 CLINICAL DATA:  Increased shortness of breath. EXAM: CHEST - 2 VIEW COMPARISON:  02/19/2018 FINDINGS: Diffuse mild bilateral interstitial thickening. No focal consolidation, pleural effusion or pneumothorax. Stable cardiomegaly. No acute osseous abnormality. IMPRESSION: Cardiomegaly with mild pulmonary vascular congestion. Electronically Signed   By: Kathreen Devoid   On: 02/26/2018 11:10   US Renal  Result Date: 02/26/2018 CLINICAL DATA:  Shortness of breath.  Left flank pain. EXAM: RENAL / URINARY TRACT ULTRASOUND COMPLETE COMPARISON:  None. FINDINGS: Right Kidney: Renal measurements: 10.6 x 4.1 x 3.1 cm = volume: 84.3 mL . Echogenicity within normal limits. No mass or hydronephrosis visualized. Left Kidney: Renal measurements: 10.4 x 4.7 x 5.2 cm = volume: 134.6 mL. Echogenicity within normal limits. No mass  or hydronephrosis visualized. Bladder: Partially distended urinary bladder with wall thickening of up to 8 mm. Small amount of free fluid is noted adjacent to the urinary bladder. IMPRESSION: Urinary bladder wall thickening of uncertain etiology. Further evaluation with focused ultrasound of the urinary bladder, when fully distended may be considered, if further imaging evaluation required. Small amount of free fluid within the pelvis adjacent to the urinary bladder. Normal appearance of the kidneys. Electronically Signed   By: Fidela Salisbury M.D.   On: 02/26/2018 13:43    Cardiac Studies   Echo 12/03/17: Study Conclusions - Left ventricle: The cavity size was mildly dilated. Wall thickness was normal. Systolic function was severely reduced. The estimated ejection fraction was in the range of 25% to 30%. Diffuse hypokinesis. - Mitral valve: There was mild regurgitation. - Left atrium: The atrium was mildly dilated. - Atrial septum: There was increased thickness of the septum, consistent with lipomatous hypertrophy. - Pulmonary arteries: PA peak pressure: 43 mm Hg (S). - Impressions: EF similar to that estimated in June 2019.  Impressions: - EF similar to that estimated in June 2019.  Patient Profile     48 y.o. female with a hx of chronic systolic and diastolic heart failure, HTN, hx of TIA, hx of tobacco use, polysubstance abuse (cocaine), COPD, and hx of breast cancer who is being seen today for the evaluation of CHF exacerbation  Assessment & Plan    1.  Acute on chronic combined systolic and diastolic heart failure -Recent echo showed EF has further diminished to 25 to 30% -This exacerbation seems to be related to her not being able to go to the restroom at work, she has not been taking her morning Lasix to avoid frequent bathroom breaks at work -She was admitted and placed on IV diuretic -Today she is overall net -3.7 L with 3.3 L urine output yesterday -Her  weight is 128 pounds down from 135 on admission -Per the patient, her dry weight is 128 pounds at home -She feels much better today, her abdomen is less firm -We will transition to 40 mg PO Lasix twice daily today -Seen and examined by Dr. Radford Pax yesterday and felt appropriate for coronary CT to evaluate causes of heart failure - Beta-blockers have been held in the past due to intermittent use of cocaine -Continue lisinopril 20 mg daily and Aldactone -We will consider addition of carvedilol with the understanding she needs to abstain from cocaine - can add this on at next clinic visit if she has a negative UDS - she will discharge on 40 mg lasix BID with instructions to  take an extra 40 mg for a weight gain of more than 3 lbs in 1 day - she will need potassium supplementation at discharge   2. Hypokalemia - K is 2.8 today - ordered IV potassium x 3 and PO kdur - need a repeat BMP after IV potassium to re-evaluate K   3.  Hypertension -Continue medications as above, BP controlled   4.  Hyperlipidemia - 06/21/2017: Cholesterol 185; HDL 93; LDL Cholesterol 80; Triglycerides 59; VLDL 12 -Can receive fasting lipids outpatient   5.  Polysubstance abuse -She understands that she needs to abstain from cocaine for her cardiovascular health -We will arrange for outpatient clinic follow-up where she can have a UDS, if negative for cocaine can consider the addition of Coreg as part of her optimal medical directed therapy       For questions or updates, please contact [place] Please consult [email or web address] for contact info under        Signed, Ledora Bottcher, PA  02/28/2018, 9:50 AM

## 2018-02-28 NOTE — Progress Notes (Signed)
PROGRESS NOTE  Sabrina Holland Marcell AJO:[id number] DOB: Jun 19, 1970 DOA: 02/26/2018 PCP: Gildardo Pounds, NP   LOS: 2 days   Brief narrative:  48 year old female history of chronic combined systolic diastolic heart failure EF 25 to 30% with diffuse hypokinesis per 2D echo 12/03/2017, history of ongoing polysubstance abuse with crack cocaine, COPD, hypertension, tobacco use, history of remote breast cancer not treated with anthracyclines presented to the ED with a 2-day history of worsening shortness of breath, 6 pound weight gain and diffuse wheezing.  Patient noted to be in acute on chronic combined systolic diastolic heart failure exacerbation.  Patient admitted and placed on IV diuretics, lisinopril, aldactone.  Due to patient's significantly depressed EF, cardiac history cardiology consulted. Assessment/Plan:  Principal Problem:   Dyspnea Active Problems:   Vitamin B 12 deficiency   Essential hypertension   Polysubstance abuse (HCC)   COPD (chronic obstructive pulmonary disease) (HCC)   Cocaine abuse (HCC)   Acute on chronic systolic and diastolic heart failure, NYHA class 3 (HCC)   Malnutrition of moderate degree  Dyspnea secondary to acute on chronic combined systolic and diastolic heart failure.   Continue on oxygen and nebulizer.  Patient initially received IV Lasix.  Been transitioned to p.o. Lasix. Cardiac enzymes negative x3.  TSH within normal limits at 0.485.    2D echocardiogram done from 11/2017 showed ejection fraction of 25 to 30%.  Cardiology has seen the patient due to systolic cardiomyopathy.  Cardiology recommended CT angiogram but patient has refused today.  Continue on Aldactone and lisinopril.  Few runs of V. tach with significant hypokalemia.  Patient will need to be aggressively replenished with potassium.  Currently getting IV runs of potassium will add p.o. potassium as well.  Check magnesium levels.  Replenish aggressively as needed.  History of  polysubstance abuse. Counseling against cocaine abuse was done.  History ofCOPD Dulera and Spiriva. Nebs as needed.    Moderate protein Caloire malnutrition, on admission Continue Ensure supplementation   VTE Prophylaxis: Lovenox  Code Status: Full code  Family Communication: No family members available at bedside  Disposition Plan: Home likely tomorrow, check electrolytes closely.   Consultants:  Cardiology  Procedures:  Chest x-ray 02/26/2018  Renal ultrasound 02/26/2018  Antibiotics: Anti-infectives (From admission, onward)   None     Subjective: Denies any chest pain, palpitation fever chills.  Nurse reported to 5 beats of V. tach today.  Hypokalemia noted.  Patient is very upset about diagnostic testing with CT angiogram.  She complains of mild difficulty breathing.  Objective: Vitals:   02/28/18 1128 02/28/18 1323  BP: 113/80 113/78  Pulse: (!) 112 91  Resp:  18  Temp:  98.6 F (37 C)  SpO2:  97%    Intake/Output Summary (Last 24 hours) at 02/28/2018 1454 Last data filed at 02/28/2018 1324 Gross per 24 hour  Intake 480 ml  Output 3100 ml  Net -2620 ml   Filed Weights   02/26/18 1800 02/27/18 0500 02/28/18 0446  Weight: 61.5 kg 58.1 kg 58.5 kg   Body mass index is 21.45 kg/m.   Physical Exam: GENERAL: Patient is alert awake and oriented. Not in obvious distress.  Thinly built HENT: No scleral pallor or icterus. Pupils equally reactive to light. Oral mucosa is moist NECK: is supple, no palpable thyroid enlargement. CHEST: Clear to auscultation. No crackles or wheezes. Non tender on palpation. Diminished breath sounds bilaterally. CVS: S1 and S2 heard, no murmur. Regular rate and rhythm. No pericardial rub. ABDOMEN: Soft,  non-tender, bowel sounds are present. No palpable hepato-splenomegaly. EXTREMITIES: Trace edema CNS: Cranial nerves are intact. No focal motor or sensory deficits. SKIN: warm and dry without rashes.  Data Review: I have  personally reviewed the following laboratory data and studies,  CBC: Recent Labs  Lab 02/26/18 0958 02/27/18 0115 02/28/18 0407  WBC 7.3 5.8 11.9*  NEUTROABS 3.5  --  6.7  HGB 12.1 12.2 12.6  HCT 39.7 38.1 41.0  MCV 82.4 81.1 80.4  PLT 402* 392 073*   Basic Metabolic Panel: Recent Labs  Lab 02/26/18 0958 02/26/18 1905 02/27/18 0115 02/28/18 0407  NA 138  --  138 138  K 4.4  --  3.7 2.8*  CL 104  --  99 97*  CO2 25  --  27 29  GLUCOSE 107*  --  143* 98  BUN 16  --  16 14  CREATININE 1.06*  --  0.93 0.96  CALCIUM 9.3  --  8.9 8.7*  MG  --  2.1  --   --    Liver Function Tests: No results for input(s): AST, ALT, ALKPHOS, BILITOT, PROT, ALBUMIN in the last 168 hours. No results for input(s): LIPASE, AMYLASE in the last 168 hours. No results for input(s): AMMONIA in the last 168 hours. Cardiac Enzymes: Recent Labs  Lab 02/26/18 1427 02/26/18 1905 02/27/18 0115  TROPONINI <0.03 <0.03 <0.03   BNP (last 3 results) Recent Labs    01/20/18 0428 02/19/18 0855 02/26/18 0959  BNP 2,017.6* 1,530.5* 2,027.2*    ProBNP (last 3 results) No results for input(s): PROBNP in the last 8760 hours.  CBG: No results for input(s): GLUCAP in the last 168 hours. No results found for this or any previous visit (from the past 240 hour(s)).   Studies: No results found.  Scheduled Meds: . albuterol  2.5 mg Nebulization Daily  . aspirin EC  81 mg Oral Daily  . atorvastatin  40 mg Oral q1800  . enoxaparin (LOVENOX) injection  40 mg Subcutaneous Q24H  . famotidine  20 mg Oral BID  . feeding supplement (ENSURE ENLIVE)  237 mL Oral BID BM  . furosemide  40 mg Oral BID  . lisinopril  20 mg Oral Daily  . mometasone-formoterol  2 puff Inhalation BID  . potassium chloride  20 mEq Oral BID  . sodium chloride flush  3 mL Intravenous Q12H  . spironolactone  25 mg Oral Daily  . umeclidinium bromide  1 puff Inhalation Daily    Continuous Infusions: . sodium chloride    . potassium  chloride 10 mEq (02/28/18 1308)     Flora Lipps, MD  [facility] 02/28/2018

## 2018-02-28 NOTE — Discharge Planning (Signed)
Met with the patient this morning.  She is known to the [facility] and has a follow up appointment scheduled with her PCP on 03/05/2018 @ 1050.  She stated that she uses the city bus and will be able to get to the clinic appointment. She did note that she was denied SCAT services.  She said that she is homeless and has been  staying with a friend or family members  She would like to have a place of her own  She noted that she has been working 2 jobs - at [employer] [place] 17 hours/week.  She said that she could afford paying about $550/month rent. She has been on the waiting list for low income housing for about a year. Explained to her that [employer] provides a listing of apartments/ rooms available in her price.  This can be further discussed when she comes to her appointment next week. She also noted that she receives $94/month in food stamps.  She said that she needs to get a new scale for home  She also explained that she was just given a nebulizer.   She stated that she has applied for medicaid and has been denied multiple times  Informed her that a referral can be made to [employer] [facility] [place] to help navigate next steps for appealing her denial. She explained her difficulties affording her medications and noted noted that she wants to make sure that she is able to get everything that she needs. She said that she will be paid this Thursday and will be able to afford her medications that are prescribed at discharge.  This CM explained to her that she will need to apply for the [employer] as well as the [employer] and [employer] . This information that is required for the applications can be reviewed when she is in the clinic next week.  The [facility] Card will allow her to charge her medications to an account if she does not have the money to pay for them.

## 2018-02-28 NOTE — Discharge Planning (Deleted)
Physician Discharge Summary  Sabrina Holland KWI:[id number] DOB: 04-15-1970 DOA: 02/26/2018  PCP: Gildardo Pounds, NP  Admit date: 02/26/2018   Discharge date: 02/28/2018  Admitted From: Home  Discharge disposition: Home   Recommendations for Outpatient Follow-Up:   Follow-up with your primary care physician on Monday which has already been scheduled.  Discharge Diagnosis:   Principal Problem:   Dyspnea Active Problems:   Vitamin B 12 deficiency   Essential hypertension   Polysubstance abuse (HCC)   COPD (chronic obstructive pulmonary disease) (HCC)   Cocaine abuse (HCC)   Acute on chronic systolic and diastolic heart failure, NYHA class 3 (HCC)   Malnutrition of moderate degree   Discharge Condition: Improved.  Diet recommendation: Low sodium, heart healthy.   Wound care: None.  Code status: Full.   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 Patient noted to be in acute on chronic combined systolic diastolic heart failure exacerbation. Patient was admitted and placed on IV diuretics, lisinopril, aldactone. Due to patient's significantly depressed EF and cardiac history cardiology consulted.   Hospital Course:   Dyspnea secondary to acute on chronic combined systolic and diastolic heart failure.    Patient received oxygen, nebulizer, IV Lasix which was subsequently changed to oral Lasix.   Dyspnea significantly improved.  Cardiac enzymes negative x3.TSH within normal limits at 0.485.   2D echocardiogram done from 11/2017 showed ejection fraction of 25 to  30%.  Cardiology has seen the patient due to systolic cardiomyopathy.  Cardiology recommended CT angiogram but patient has refused today.  Will need to continue Aldactone and lisinopril.  Hypokalemia aggressively replenished while in the hospital.  Patient is adamant about going home.  She will be given 5-day more course of potassium.  Patient received IV potassium runs plus p.o. while in the hospital.  Patient was advised to follow-up with her primary care physician on Monday to check blood work.  History of polysubstance abuse. Counseling against cocaine abuse was done.  History ofCOPD Dulera and Spiriva. Nebs as needed.  Moderate protein Caloire malnutrition, on admission Continue Ensure supplementation    Medical Consultants:    None.   Discharge Exam:   Vitals:   02/28/18 1128 02/28/18 1323  BP: 113/80 113/78  Pulse: (!) 112 91  Resp:  18  Temp:  98.6 F (37 C)  SpO2:  97%   Vitals:   02/28/18 0446 02/28/18 0824 02/28/18 1128 02/28/18 1323  BP: 101/79  113/80 113/78  Pulse: 98 78 (!) 112 91  Resp: 18 17  18   Temp: 98.3 F (36.8 C)   98.6 F (37 C)  TempSrc:    Oral  SpO2: 100% 98%  97%  Weight: 58.5 kg     Height:        General exam: Appears calm and comfortable.  Thinly built Respiratory system: Clear to auscultation. Respiratory effort normal. Cardiovascular system: S1 & S2 heard, RRR. No JVD,  rubs, gallops or clicks. No murmurs. Gastrointestinal system: Abdomen is nondistended, soft and nontender. No organomegaly or masses felt. Normal bowel sounds heard. Central nervous system: Alert and oriented. No focal neurological deficits. Extremities: No clubbing,  or cyanosis.  Skin: No rashes, lesions or ulcers. Psychiatry: Judgement and insight appear normal. Mood & affect appropriate.    The results of significant diagnostics from this hospitalization (including imaging, microbiology, ancillary and laboratory) are listed below for reference.      Procedures and Diagnostic Studies:   Dg Chest 2 View  Result Date: 02/26/2018 CLINICAL DATA:  Increased shortness of breath. EXAM: CHEST - 2 VIEW COMPARISON:  02/19/2018 FINDINGS: Diffuse mild bilateral interstitial thickening. No focal consolidation, pleural effusion or pneumothorax. Stable cardiomegaly. No acute osseous abnormality. IMPRESSION: Cardiomegaly with mild pulmonary vascular congestion. Electronically Signed   By: Kathreen Devoid   On: 02/26/2018 11:10   US Renal  Result Date: 02/26/2018 CLINICAL DATA:  Shortness of breath.  Left flank pain. EXAM: RENAL / URINARY TRACT ULTRASOUND COMPLETE COMPARISON:  None. FINDINGS: Right Kidney: Renal measurements: 10.6 x 4.1 x 3.1 cm = volume: 84.3 mL . Echogenicity within normal limits. No mass or hydronephrosis visualized. Left Kidney: Renal measurements: 10.4 x 4.7 x 5.2 cm = volume: 134.6 mL. Echogenicity within normal limits. No mass or hydronephrosis visualized. Bladder: Partially distended urinary bladder with wall thickening of up to 8 mm. Small amount of free fluid is noted adjacent to the urinary bladder. IMPRESSION: Urinary bladder wall thickening of uncertain etiology. Further evaluation with focused ultrasound of the urinary bladder, when fully distended may be considered, if further imaging evaluation required. Small amount of free fluid within the pelvis adjacent to the urinary bladder. Normal appearance of the kidneys. Electronically Signed   By: Fidela Salisbury M.D.   On: 02/26/2018 13:43     Labs:   Basic Metabolic Panel: Recent Labs  Lab 02/26/18 0958 02/26/18 1905 02/27/18 0115 02/28/18 0407  NA 138  --  138 138  K 4.4  --  3.7 2.8*  CL 104  --  99 97*  CO2 25  --  27 29  GLUCOSE 107*  --  143* 98  BUN 16  --  16 14  CREATININE 1.06*  --  0.93 0.96  CALCIUM 9.3  --  8.9 8.7*  MG  --  2.1  --   --    GFR Estimated Creatinine Clearance: 63.8 mL/min (by C-G formula based on SCr of 0.96 mg/dL). Liver Function  Tests: No results for input(s): AST, ALT, ALKPHOS, BILITOT, PROT, ALBUMIN in the last 168 hours. No results for input(s): LIPASE, AMYLASE in the last 168 hours. No results for input(s): AMMONIA in the last 168 hours. Coagulation profile No results for input(s): INR, PROTIME in the last 168 hours.  CBC: Recent Labs  Lab 02/26/18 0958 02/27/18 0115 02/28/18 0407  WBC 7.3 5.8 11.9*  NEUTROABS 3.5  --  6.7  HGB 12.1 12.2 12.6  HCT 39.7 38.1 41.0  MCV 82.4 81.1 80.4  PLT 402* 392 420*   Cardiac Enzymes: Recent Labs  Lab 02/26/18 1427 02/26/18 1905 02/27/18 0115  TROPONINI <0.03 <0.03 <0.03   BNP: Invalid input(s): POCBNP CBG: No results for input(s): GLUCAP in the last 168 hours. D-Dimer No results for input(s): DDIMER in the last 72 hours. Hgb A1c No results for input(s): HGBA1C in the last 72 hours. Lipid Profile No results for input(s): CHOL, HDL, LDLCALC, TRIG, CHOLHDL, LDLDIRECT in the last 72 hours. Thyroid function studies Recent Labs    02/26/18 0957  TSH 0.485   Anemia work up No results for input(s): VITAMINB12, FOLATE, FERRITIN, TIBC, IRON, RETICCTPCT in the last 72 hours. Microbiology No results found  for this or any previous visit (from the past 240 hour(s)).   Discharge Instructions:   Discharge Instructions    Diet - low sodium heart healthy   Complete by:  As directed    Discharge instructions   Complete by:  As directed    Follow-up with your primary care physician as scheduled on Monday.  Take potassium all the medications as prescribed.   Increase activity slowly   Complete by:  As directed      Allergies as of 02/28/2018      Reactions   Penicillins Other (See Comments)   Pt had slight throat swelling Has patient had a PCN reaction causing immediate rash, facial/tongue/throat swelling, SOB or lightheadedness with hypotension: yes Has patient had a PCN reaction causing severe rash involving mucus membranes or skin necrosis: No Has  patient had a PCN reaction that required hospitalization No Has patient had a PCN reaction occurring within the last 10 years: No If all of the above answers are "NO", then may proceed with Cephalosporin use.      Medication List    TAKE these medications   albuterol 108 (90 Base) MCG/ACT inhaler Commonly known as:  PROVENTIL HFA Inhale 1-2 puffs into the lungs every 6 (six) hours as needed for wheezing or shortness of breath. Use 2 puffs 3 times daily x 3 days, then every 6 hours as needed.   amLODipine 5 MG tablet Commonly known as:  NORVASC Take 1 tablet (5 mg total) by mouth daily.   aspirin 81 MG EC tablet Take 1 tablet (81 mg total) by mouth daily.   atorvastatin 40 MG tablet Commonly known as:  LIPITOR Take 1 tablet (40 mg total) by mouth daily at 6 PM.   budesonide-formoterol 80-4.5 MCG/ACT inhaler Commonly known as:  SYMBICORT Inhale 2 puffs into the lungs 2 (two) times daily.   famotidine 20 MG tablet Commonly known as:  PEPCID Take 1 tablet (20 mg total) by mouth 2 (two) times daily. What changed:    when to take this  reasons to take this   fluticasone 50 MCG/ACT nasal spray Commonly known as:  FLONASE Place 2 sprays into both nostrils daily.   furosemide 20 MG tablet Commonly known as:  LASIX Take 2 tablets (40 mg total) by mouth 2 (two) times daily.   lisinopril 20 MG tablet Commonly known as:  PRINIVIL,ZESTRIL Take 1 tablet (20 mg total) by mouth daily.   multivitamin with minerals Tabs tablet Take 1 tablet by mouth daily.   nicotine 14 mg/24hr patch Commonly known as:  NICODERM CQ - dosed in mg/24 hours Place 1 patch (14 mg total) onto the skin daily.   potassium chloride SA 20 MEQ tablet Commonly known as:  K-DUR,KLOR-CON Take 1 tablet (20 mEq total) by mouth daily.   predniSONE 20 MG tablet Commonly known as:  DELTASONE Take 2 tablets (40 mg total) by mouth daily with breakfast. For the next four days   spironolactone 25 MG  tablet Commonly known as:  ALDACTONE Take 1 tablet (25 mg total) by mouth daily.   tiotropium 18 MCG inhalation capsule Commonly known as:  SPIRIVA HANDIHALER Place 1 capsule (18 mcg total) into inhaler and inhale daily.            Durable Medical Equipment  (From admission, onward)         Start     Ordered   02/27/18 1036  For home use only DME Nebulizer/meds  Once  Question:  Patient needs a nebulizer to treat with the following condition  Answer:  COPD (chronic obstructive pulmonary disease) ([place])   02/27/18 1036         Follow-up Information    [facility] Follow up on 03/05/2018.   Why:  Appointment 03/05/2018 at 10:50 AM. Please keep appointment.  Contact information: [address] [place] [address] [phone]           Time coordinating discharge: 39 minutes  Signed:  Katalyn Matin  [facility] 02/28/2018, 5:16 PM

## 2018-03-01 ENCOUNTER — Telehealth: Payer: Self-pay

## 2018-03-01 MED FILL — POTASSIUM CL ER 20 MEQ TAB: 20 | 5 days supply | Qty: 5 | Fill #0

## 2018-03-01 NOTE — Telephone Encounter (Signed)
Transition Care Management Follow-up Telephone Call Date of discharge and from where: 02/28/2018, [facility].  Call placed to # [phone] and a message was left requesting a call back to this CM # [phone].  Attempted x 3 to call # [phone] and the phone just rings busy.

## 2018-03-01 NOTE — Discharge Summary (Signed)
Physician Discharge Summary  Sabrina Holland DGL:[id number] DOB: 09/11/1970 DOA: 02/26/2018  PCP: Gildardo Pounds, NP  Admit date: 02/26/2018 Discharge date: 02/28/2018  Admitted From: Home  Discharge disposition: Home   Recommendations for Outpatient Follow-Up:   Follow-up with your primary care physician on Monday which has already been scheduled.  Discharge Diagnosis:   Principal Problem:   Dyspnea Active Problems:   Vitamin B 12 deficiency   Essential hypertension   Polysubstance abuse (HCC)   COPD (chronic obstructive pulmonary disease) (HCC)   Cocaine abuse (HCC)   Acute on chronic systolic and diastolic heart failure, NYHA class 3 (HCC)   Malnutrition of moderate degree    Discharge Condition: Improved.  Diet recommendation: Low sodium, heart healthy.    Wound care: None.  Code status: Full.   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 Patient noted to be in acute on chronic combined systolic diastolic heart failure exacerbation. Patient was admitted and placed on IV diuretics, lisinopril, aldactone. Due to patient's significantly depressed EF and cardiac history cardiology consulted.   Hospital Course:   Dyspnea secondary to acute on chronic combined systolic and diastolic heart failure.   Patient received oxygen, nebulizer, IV Lasix which was subsequently changed to oral Lasix.  Dyspnea significantly improved.  Cardiac enzymes negative x3.TSH within normal limits at 0.485.2D echocardiogram done from 11/2017 showed ejection fraction of 25 to  30%. Cardiologyhas seen the patient due to systolic cardiomyopathy. Cardiology recommended CT angiogram but patient has refused today.  Will need to continue Aldactone and lisinopril.  Hypokalemia aggressively replenished while in the hospital.  Patient is adamant about going home.  She will be given 5-day more course of potassium.  Patient received IV potassium runs plus p.o. while in the hospital.  Patient was advised to follow-up with her primary care physician on Monday to check blood work.  History of polysubstance abuse. Counseling against cocaine abuse was done.  History ofCOPD Dulera and Spiriva. Nebs as needed.  Moderate proteinCaloiremalnutrition,on admission Continue Ensure supplementation     Medical Consultants:    Cardiology   Discharge Exam:   Vitals:   02/28/18 1128 02/28/18 1323  BP: 113/80 113/78  Pulse: (!) 112 91  Resp:  18  Temp:  98.6 F (37 C)  SpO2:  97%   Vitals:   02/28/18 0446 02/28/18 0824 02/28/18 1128 02/28/18 1323  BP: 101/79  113/80 113/78  Pulse: 98 78 (!) 112 91  Resp: 18 17  18   Temp: 98.3 F (36.8 C)   98.6 F (37 C)  TempSrc:    Oral  SpO2: 100% 98%  97%  Weight: 58.5 kg     Height:        General exam: Appears calm and comfortable.  Thinly built Respiratory system: Clear to auscultation. Respiratory effort normal. Cardiovascular system: S1 & S2 heard, RRR. No JVD,  rubs, gallops or clicks. No murmurs. Gastrointestinal system: Abdomen is nondistended, soft and nontender. No organomegaly or masses felt. Normal bowel sounds heard. Central nervous system: Alert and oriented. No focal neurological deficits. Extremities: No clubbing,  or cyanosis.  Skin: No rashes, lesions or  ulcers. Psychiatry: Judgement and insight appear normal. Mood & affect appropriate.   The results of significant diagnostics from this hospitalization (including imaging, microbiology, ancillary and laboratory) are listed below for reference.      Procedures and Diagnostic Studies:   Dg Chest 2 View  Result Date: 02/26/2018 CLINICAL DATA:  Increased shortness of breath. EXAM: CHEST - 2 VIEW COMPARISON:  02/19/2018 FINDINGS: Diffuse mild bilateral interstitial thickening. No focal consolidation, pleural effusion or pneumothorax. Stable cardiomegaly. No acute osseous abnormality. IMPRESSION: Cardiomegaly with mild pulmonary vascular congestion. Electronically Signed   By: Kathreen Devoid   On: 02/26/2018 11:10   US Renal  Result Date: 02/26/2018 CLINICAL DATA:  Shortness of breath.  Left flank pain. EXAM: RENAL / URINARY TRACT ULTRASOUND COMPLETE COMPARISON:  None. FINDINGS: Right Kidney: Renal measurements: 10.6 x 4.1 x 3.1 cm = volume: 84.3 mL . Echogenicity within normal limits. No mass or hydronephrosis visualized. Left Kidney: Renal measurements: 10.4 x 4.7 x 5.2 cm = volume: 134.6 mL. Echogenicity within normal limits. No mass or hydronephrosis visualized. Bladder: Partially distended urinary bladder with wall thickening of up to 8 mm. Small amount of free fluid is noted adjacent to the urinary bladder. IMPRESSION: Urinary bladder wall thickening of uncertain etiology. Further evaluation with focused ultrasound of the urinary bladder, when fully distended may be considered, if further imaging evaluation required. Small amount of free fluid within the pelvis adjacent to the urinary bladder. Normal appearance of the kidneys. Electronically Signed   By: Fidela Salisbury M.D.   On: 02/26/2018 13:43     Labs:   Basic Metabolic Panel: Recent Labs  Lab 02/26/18 0958 02/26/18 1905 02/27/18 0115 02/28/18 0407  NA 138  --  138 138  K 4.4  --  3.7 2.8*  CL 104  --  99 97*  CO2 25  --  27 29  GLUCOSE 107*  --  143* 98  BUN 16  --  16 14  CREATININE 1.06*  --  0.93 0.96  CALCIUM 9.3  --  8.9 8.7*  MG  --  2.1  --   --    GFR Estimated Creatinine Clearance: 63.8 mL/min (by C-G formula based on SCr of 0.96 mg/dL). Liver Function  Tests: No results for input(s): AST, ALT, ALKPHOS, BILITOT, PROT, ALBUMIN in the last 168 hours. No results for input(s): LIPASE, AMYLASE in the last 168 hours. No results for input(s): AMMONIA in the last 168 hours. Coagulation profile No results for input(s): INR, PROTIME in the last 168 hours.  CBC: Recent Labs  Lab 02/26/18 0958 02/27/18 0115 02/28/18 0407  WBC 7.3 5.8 11.9*  NEUTROABS 3.5  --  6.7  HGB 12.1 12.2 12.6  HCT 39.7 38.1 41.0  MCV 82.4 81.1 80.4  PLT 402* 392 420*   Cardiac Enzymes: Recent Labs  Lab 02/26/18 1427 02/26/18 1905 02/27/18 0115  TROPONINI <0.03 <0.03 <0.03   BNP: Invalid input(s): POCBNP CBG: No results for input(s): GLUCAP in the last 168 hours. D-Dimer No results for input(s): DDIMER in the last 72 hours. Hgb A1c No results for input(s): HGBA1C in the last 72 hours. Lipid Profile No results for input(s): CHOL, HDL, LDLCALC, TRIG, CHOLHDL, LDLDIRECT in the last 72 hours. Thyroid function studies Recent Labs    02/26/18 0957  TSH 0.485   Anemia work up No results for input(s): VITAMINB12, FOLATE, FERRITIN, TIBC, IRON, RETICCTPCT in the last 72 hours. Microbiology No results found for this or any previous visit (from the past  240 hour(s)).   Discharge Instructions:   Discharge Instructions    Diet - low sodium heart healthy   Complete by:  As directed    Discharge instructions   Complete by:  As directed    Follow-up with your primary care physician as scheduled on Monday.  Take potassium all the medications as prescribed.   Increase activity slowly   Complete by:  As directed      Allergies as of 02/28/2018      Reactions   Penicillins Other (See Comments)   Pt had slight throat swelling Has patient had a PCN reaction causing immediate rash, facial/tongue/throat swelling, SOB or lightheadedness with hypotension: yes Has patient had a PCN reaction causing severe rash involving mucus membranes or skin necrosis: No Has  patient had a PCN reaction that required hospitalization No Has patient had a PCN reaction occurring within the last 10 years: No If all of the above answers are "NO", then may proceed with Cephalosporin use.      Medication List    TAKE these medications   albuterol 108 (90 Base) MCG/ACT inhaler Commonly known as:  PROVENTIL HFA Inhale 1-2 puffs into the lungs every 6 (six) hours as needed for wheezing or shortness of breath. Use 2 puffs 3 times daily x 3 days, then every 6 hours as needed.   amLODipine 5 MG tablet Commonly known as:  NORVASC Take 1 tablet (5 mg total) by mouth daily.   aspirin 81 MG EC tablet Take 1 tablet (81 mg total) by mouth daily.   atorvastatin 40 MG tablet Commonly known as:  LIPITOR Take 1 tablet (40 mg total) by mouth daily at 6 PM.   budesonide-formoterol 80-4.5 MCG/ACT inhaler Commonly known as:  SYMBICORT Inhale 2 puffs into the lungs 2 (two) times daily.   famotidine 20 MG tablet Commonly known as:  PEPCID Take 1 tablet (20 mg total) by mouth 2 (two) times daily. What changed:    when to take this  reasons to take this   fluticasone 50 MCG/ACT nasal spray Commonly known as:  FLONASE Place 2 sprays into both nostrils daily.   furosemide 20 MG tablet Commonly known as:  LASIX Take 2 tablets (40 mg total) by mouth 2 (two) times daily.   lisinopril 20 MG tablet Commonly known as:  PRINIVIL,ZESTRIL Take 1 tablet (20 mg total) by mouth daily.   multivitamin with minerals Tabs tablet Take 1 tablet by mouth daily.   nicotine 14 mg/24hr patch Commonly known as:  NICODERM CQ - dosed in mg/24 hours Place 1 patch (14 mg total) onto the skin daily.   potassium chloride SA 20 MEQ tablet Commonly known as:  K-DUR,KLOR-CON Take 1 tablet (20 mEq total) by mouth daily.   predniSONE 20 MG tablet Commonly known as:  DELTASONE Take 2 tablets (40 mg total) by mouth daily with breakfast. For the next four days   spironolactone 25 MG  tablet Commonly known as:  ALDACTONE Take 1 tablet (25 mg total) by mouth daily.   tiotropium 18 MCG inhalation capsule Commonly known as:  SPIRIVA HANDIHALER Place 1 capsule (18 mcg total) into inhaler and inhale daily.      Follow-up [facility] Follow up on 03/05/2018.   Why:  Appointment 03/05/2018 at 10:50 AM. Please keep appointment.  Contact information: [address] [place] [address] [phone]           Time coordinating discharge:  39 minutes  Signed:  Briaunna Grindstaff  [facility] 03/01/2018, 8:04 AM

## 2018-03-02 ENCOUNTER — Telehealth: Payer: Self-pay

## 2018-03-02 NOTE — Telephone Encounter (Signed)
Transition Care Management Follow-up Telephone Call  Date of discharge and from where: 02/28/2018, [facility]   How have you been since you were released from the hospital? She stated that she is " feeling okay, trying to rest."   Any questions or concerns? No questions/ concerns at this time.   Items Reviewed:  Did the pt receive and understand the discharge instructions provided? She said that she has the instructions and does not have any questions.   Medications obtained and verified? She said that she needs to pick up the potassium today and his been eating bananas in the meantime.  She also said that she has the nebulizer but no medication was ordered for it.  She did not want to review the medication list and noted that nothing has changed.    Any new allergies since your discharge? None reported  Do you have support at home? Yes, family  Other (ie: DME, Home Health, etc) she said that she was given a nebulizer when she left the hospital but no medication was prescribed for it.   She will need to get a scale to monitor weight  Functional Questionnaire: (I = Independent and D = Dependent) ADL's: independent   Follow up appointments reviewed:    PCP Hospital f/u appt confirmed? Yes, 03/05/2018 @ 1050 @ [facility] f/u appt confirmed? Cardiology appointment 04/09/2018  Are transportation arrangements needed? Yes,  [place] to arrange for cab transportation.  Address in Epic confirmed   If their condition worsens, is the pt aware to call  their PCP or go to the ED? yes  Was the patient provided with contact information for the PCP's office or ED? She has the phone number  Was the pt encouraged to call back with questions or concerns? yes

## 2018-03-05 ENCOUNTER — Ambulatory Visit: Payer: Self-pay | Attending: Nurse Practitioner | Admitting: Nurse Practitioner

## 2018-03-05 ENCOUNTER — Telehealth: Payer: Self-pay

## 2018-03-05 ENCOUNTER — Encounter: Payer: Self-pay | Admitting: Nurse Practitioner

## 2018-03-05 VITALS — BP 108/76 | HR 108 | Temp 98.0°F | Resp 14 | Ht 65.0 in | Wt 132.6 lb

## 2018-03-05 DIAGNOSIS — I5043 Acute on chronic combined systolic (congestive) and diastolic (congestive) heart failure: Secondary | ICD-10-CM

## 2018-03-05 DIAGNOSIS — E782 Mixed hyperlipidemia: Secondary | ICD-10-CM

## 2018-03-05 DIAGNOSIS — E876 Hypokalemia: Secondary | ICD-10-CM

## 2018-03-05 DIAGNOSIS — I10 Essential (primary) hypertension: Secondary | ICD-10-CM

## 2018-03-05 DIAGNOSIS — F17200 Nicotine dependence, unspecified, uncomplicated: Secondary | ICD-10-CM

## 2018-03-05 DIAGNOSIS — J449 Chronic obstructive pulmonary disease, unspecified: Secondary | ICD-10-CM

## 2018-03-05 DIAGNOSIS — K219 Gastro-esophageal reflux disease without esophagitis: Secondary | ICD-10-CM

## 2018-03-05 MED ORDER — TIOTROPIUM BROMIDE MONOHYDRATE 18 MCG IN CAPS: 18.0000 ug | ORAL_CAPSULE | Freq: Every day | RESPIRATORY_TRACT | 11 refills | Status: DC

## 2018-03-05 MED ORDER — AMLODIPINE BESYLATE 5 MG PO TABS: 5.0000 mg | ORAL_TABLET | Freq: Every day | ORAL | 0 refills | Status: DC

## 2018-03-05 MED ORDER — ALBUTEROL SULFATE (2.5 MG/3ML) 0.083% IN NEBU: 2.5000 mg | INHALATION_SOLUTION | Freq: Four times a day (QID) | RESPIRATORY_TRACT | 1 refills | Status: DC | PRN

## 2018-03-05 MED ORDER — LISINOPRIL 20 MG PO TABS: 20.0000 mg | ORAL_TABLET | Freq: Every day | ORAL | 0 refills | Status: DC

## 2018-03-05 MED ORDER — OMEPRAZOLE 20 MG PO CPDR: 20.0000 mg | DELAYED_RELEASE_CAPSULE | Freq: Every day | ORAL | 3 refills | Status: DC

## 2018-03-05 MED ORDER — BUDESONIDE-FORMOTEROL FUMARATE 80-4.5 MCG/ACT IN AERO: 2.0000 | INHALATION_SPRAY | Freq: Two times a day (BID) | RESPIRATORY_TRACT | 0 refills | Status: DC

## 2018-03-05 MED ORDER — NICOTINE 14 MG/24HR TD PT24: 14.0000 mg | MEDICATED_PATCH | Freq: Every day | TRANSDERMAL | 0 refills | Status: DC

## 2018-03-05 MED ORDER — POTASSIUM CHLORIDE CRYS ER 20 MEQ PO TBCR: 20.0000 meq | EXTENDED_RELEASE_TABLET | Freq: Every day | ORAL | 0 refills | Status: DC

## 2018-03-05 MED ORDER — FUROSEMIDE 20 MG PO TABS: 20.0000 mg | ORAL_TABLET | Freq: Two times a day (BID) | ORAL | 0 refills | Status: DC

## 2018-03-05 MED ORDER — ATORVASTATIN CALCIUM 40 MG PO TABS: 40.0000 mg | ORAL_TABLET | Freq: Every day | ORAL | 0 refills | Status: DC

## 2018-03-05 MED ORDER — NICOTINE 7 MG/24HR TD PT24: 7.0000 mg | MEDICATED_PATCH | Freq: Every day | TRANSDERMAL | 0 refills | Status: AC

## 2018-03-05 MED ORDER — ALBUTEROL SULFATE HFA 108 (90 BASE) MCG/ACT IN AERS: 1.0000 | INHALATION_SPRAY | Freq: Four times a day (QID) | RESPIRATORY_TRACT | 1 refills | Status: DC | PRN

## 2018-03-05 MED FILL — AMLODIPINE BESYLATE 5 MG TA: 5 | 30 days supply | Qty: 30 | Fill #0

## 2018-03-05 MED FILL — NICOTINE 14 MG/24HR PATCH: 14 | 28 days supply | Qty: 28 | Fill #0

## 2018-03-05 MED FILL — SPIRIVA 18 MCG CP-HANDIHALE: 18 | 30 days supply | Qty: 30 | Fill #0

## 2018-03-05 MED FILL — LISINOPRIL 20 MG TAB: 20 | 30 days supply | Qty: 30 | Fill #0

## 2018-03-05 MED FILL — POTASSIUM CL ER 20 MEQ TAB: 20 | 14 days supply | Qty: 14 | Fill #0

## 2018-03-05 MED FILL — !VENTOLIN HFA INHALER: 108 (90 BAS | 25 days supply | Qty: 18 | Fill #0

## 2018-03-05 MED FILL — FUROSEMIDE 20 MG TABLET: 20 | 30 days supply | Qty: 60 | Fill #0

## 2018-03-05 MED FILL — !SYMBICORT 80-4.5 MCG INH: 80-4.5 | 30 days supply | Qty: 1 | Fill #0

## 2018-03-05 MED FILL — ALBUTEROL SUL 2.5 MG/3 ML S: (2.5 MG/3ML | 12 days supply | Qty: 150 | Fill #0

## 2018-03-05 MED FILL — ATORVASTATIN CALCIUM 40 MG: 40 | 30 days supply | Qty: 30 | Fill #0

## 2018-03-05 MED FILL — OMEPRAZOLE 20 MG CAP: 20 | 30 days supply | Qty: 30 | Fill #0

## 2018-03-05 NOTE — Progress Notes (Signed)
Assessment & Plan:  Sabrina Holland was seen today for follow-up.  Diagnoses and all orders for this visit:  Hypokalemia -     Basic metabolic panel -     potassium chloride SA (K-DUR,KLOR-CON) 20 MEQ tablet; Take 1 tablet (20 mEq total) by mouth daily for 14 days.  Mixed hyperlipidemia -     atorvastatin (LIPITOR) 40 MG tablet; Take 1 tablet (40 mg total) by mouth daily at 6 PM. INSTRUCTIONS: Work on a low fat, heart healthy diet and participate in regular aerobic exercise program by working out at least 150 minutes per week; 5 days a week-30 minutes per day. Avoid red meat, fried foods. junk foods, sodas, sugary drinks, unhealthy snacking, alcohol and smoking.  Drink at least 48oz of water per day and monitor your carbohydrate intake daily.   Acute on chronic combined systolic and diastolic CHF (congestive heart failure) (HCC) -     furosemide (LASIX) 20 MG tablet; Take 1 tablet (20 mg total) by mouth 2 (two) times daily. Monitor daily sodium intake Follow up with Cardiology in March as scheduled.   Essential hypertension -     amLODipine (NORVASC) 5 MG tablet; Take 1 tablet (5 mg total) by mouth daily. -     lisinopril (PRINIVIL,ZESTRIL) 20 MG tablet; Take 1 tablet (20 mg total) by mouth daily. Continue all antihypertensives as prescribed.  Remember to bring in your blood pressure log with you for your follow up appointment.  DASH/Mediterranean Diets are healthier choices for HTN.    Chronic obstructive pulmonary disease, unspecified COPD type (HCC) -     budesonide-formoterol (SYMBICORT) 80-4.5 MCG/ACT inhaler; Inhale 2 puffs into the lungs 2 (two) times daily. -     tiotropium (SPIRIVA HANDIHALER) 18 MCG inhalation capsule; Place 1 capsule (18 mcg total) into inhaler and inhale daily. -     albuterol (PROVENTIL HFA) 108 (90 Base) MCG/ACT inhaler; Inhale 1-2 puffs into the lungs every 6 (six) hours as needed for up to 1 day for wheezing or shortness of breath (cough). -     albuterol  (PROVENTIL) (2.5 MG/3ML) 0.083% nebulizer solution; Take 3 mLs (2.5 mg total) by nebulization every 6 (six) hours as needed for wheezing or shortness of breath.  Gastroesophageal reflux disease, esophagitis presence not specified -     omeprazole (PRILOSEC) 20 MG capsule; Take 1 capsule (20 mg total) by mouth daily. INSTRUCTIONS: Avoid GERD Triggers: acidic, spicy or fried foods, caffeine, coffee, sodas,  alcohol and chocolate.   Tobacco dependence -     nicotine (NICODERM CQ - DOSED IN MG/24 HOURS) 14 mg/24hr patch; Place 1 patch (14 mg total) onto the skin daily. -     nicotine (NICODERM CQ - DOSED IN MG/24 HR) 7 mg/24hr patch; Place 1 patch (7 mg total) onto the skin daily for 14 days. Sabrina Holland continues to smoke several cigarettes per day. 2. Sabrina Holland was counseled on the dangers of tobacco use, and was advised to quit. We reviewed specific strategies to maximize success, including removing cigarettes and smoking materials from environment, stress management and support of family/friends as well as pharmacological alternatives. 3. A total of 4 minutes was spent on counseling for smoking cessation and Sabrina Holland is ready to quit and has chosen Nicotine patches to start today.  Sabrina Holland was offered Wellbutrin, Chantix, Nicotine patch, Nicotine gum or lozenges.  Due to out of pocket costs Sabrina Holland was also given smoking cessation support and advised to contact: the Smoking Cessation hotline: 1-800-QUIT-NOW.  Sabrina Holland  was also informed of our Smoking cessation classes which are also available through [facility] by calling [phone] or visit our website at [email or web address]  5. Will follow up at next scheduled office visit.     Patient has been counseled on age-appropriate routine health concerns for screening and prevention. These are reviewed and up-to-date. Referrals have been placed accordingly. Immunizations are up-to-date or declined.    Subjective:   Chief  Complaint  Patient presents with  . Follow-up    Pt. stated she is here for HFU. Pt. stated her balance is off and her fluid is off.  Pt. have not pick up her Rx that was sent from the hospital.    HPI Sabrina Holland 48 y.o. female presents to office today for hospital follow. She was admitted 2-10 through 2-12 with COPD exacerbation and acute on chronic systolic and diastolic heart failure exacerbation.  She received IV diuretisc, oxygen, nebulizers, lisinopril, aldactone. Cardiology recommended CT angiogram however patient refused. She was discharged home with a 5 day course of potassium and instructed to follow up for repeat lab work as she was noted for hypokalemia.  She has a history of ongoing substance abuse with crack cocaine, COPD, hypertension, tobacco dependence.  Essential Hypertension Well controlled today.  Currently taking amlodipine 5 mg daily, lisinopril 20 mg daily and spironolactone 25 mg daily. She denies chest pain, palpitations, lightheadedness, dizziness, headaches or visual disturbances.  BP Readings from Last 3 Encounters:  03/05/18 108/76  02/28/18 113/78  02/19/18 (!) 147/103   COPD  She has 2 new inhalers (dulera and incruse). However she has not used these inhalers and wishes to restart her symbicort, albuterol nebs, and spiriva.  She denies any increased shortness of breath, cough or wheezing.   Mixed Hyperlipidemia LDL at goal. Taking atorvastatin 40 mg daily as prescribed. Denies any statin intolerance or myalgia.   Lab Results  Component Value Date   LDLCALC 80 06/21/2017   Review of Systems  Constitutional: Negative for fever, malaise/fatigue and weight loss.  HENT: Negative.  Negative for nosebleeds.   Eyes: Negative.  Negative for blurred vision, double vision and photophobia.  Respiratory: Positive for shortness of breath. Negative for cough and wheezing.   Cardiovascular: Negative.  Negative for chest pain, palpitations and leg swelling.    Gastrointestinal: Negative.  Negative for heartburn, nausea and vomiting.  Musculoskeletal: Negative.  Negative for myalgias.  Neurological: Negative.  Negative for dizziness, focal weakness, seizures and headaches.  Psychiatric/Behavioral: Positive for substance abuse. Negative for suicidal ideas.    Past Medical History:  Diagnosis Date  . Breast cancer ([place])   . Breast cancer, stage 2 ([place]) 02/23/2011  . COPD (chronic obstructive pulmonary disease) ([place])   . Hypertension   . TIA (transient ischemic attack) 11/25/2011   "first time" (11/25/2011)    Past Surgical History:  Procedure Laterality Date  . BREAST BIOPSY  2007   left  . MASTECTOMY  09/2005   left  . TONSILLECTOMY AND ADENOIDECTOMY     "when I was little" (11/25/2011)  . TUBAL LIGATION  1993    Family History  Problem Relation Age of Onset  . COPD Father   . Sickle cell anemia Cousin        maternal cousin    Social History Reviewed with no changes to be made today.   Outpatient Medications Prior to Visit  Medication Sig Dispense Refill  . albuterol (PROVENTIL HFA) 108 (90 Base) MCG/ACT inhaler Inhale  1-2 puffs into the lungs every 6 (six) hours as needed for wheezing or shortness of breath. Use 2 puffs 3 times daily x 3 days, then every 6 hours as needed. 1 Inhaler 0  . budesonide-formoterol (SYMBICORT) 80-4.5 MCG/ACT inhaler Inhale 2 puffs into the lungs 2 (two) times daily. 1 Inhaler 0  . furosemide (LASIX) 20 MG tablet Take 2 tablets (40 mg total) by mouth 2 (two) times daily. 30 tablet 0  . tiotropium (SPIRIVA HANDIHALER) 18 MCG inhalation capsule Place 1 capsule (18 mcg total) into inhaler and inhale daily. 30 capsule 11  . aspirin EC 81 MG EC tablet Take 1 tablet (81 mg total) by mouth daily. (Patient not taking: Reported on 03/05/2018) 30 tablet 0  . famotidine (PEPCID) 20 MG tablet Take 1 tablet (20 mg total) by mouth 2 (two) times daily. (Patient not taking: Reported on 03/05/2018) 30 tablet 0  . fluticasone  (FLONASE) 50 MCG/ACT nasal spray Place 2 sprays into both nostrils daily. (Patient not taking: Reported on 03/05/2018) 16 g 0  . Multiple Vitamin (MULTIVITAMIN WITH MINERALS) TABS tablet Take 1 tablet by mouth daily.    Marland Kitchen spironolactone (ALDACTONE) 25 MG tablet Take 1 tablet (25 mg total) by mouth daily. (Patient not taking: Reported on 03/05/2018) 30 tablet 0  . amLODipine (NORVASC) 5 MG tablet Take 1 tablet (5 mg total) by mouth daily. (Patient not taking: Reported on 03/05/2018) 30 tablet 0  . atorvastatin (LIPITOR) 40 MG tablet Take 1 tablet (40 mg total) by mouth daily at 6 PM. (Patient not taking: Reported on 03/05/2018) 90 tablet 3  . lisinopril (PRINIVIL,ZESTRIL) 20 MG tablet Take 1 tablet (20 mg total) by mouth daily. (Patient not taking: Reported on 03/05/2018) 30 tablet 0  . nicotine (NICODERM CQ - DOSED IN MG/24 HOURS) 14 mg/24hr patch Place 1 patch (14 mg total) onto the skin daily. (Patient not taking: Reported on 03/05/2018) 28 patch 0  . potassium chloride SA (K-DUR,KLOR-CON) 20 MEQ tablet Take 1 tablet (20 mEq total) by mouth daily. (Patient not taking: Reported on 03/05/2018) 5 tablet 0  . predniSONE (DELTASONE) 20 MG tablet Take 2 tablets (40 mg total) by mouth daily with breakfast. For the next four days (Patient not taking: Reported on 03/05/2018) 8 tablet 0   No facility-administered medications prior to visit.     Allergies  Allergen Reactions  . Penicillins Other (See Comments)    Pt had slight throat swelling Has patient had a PCN reaction causing immediate rash, facial/tongue/throat swelling, SOB or lightheadedness with hypotension: yes Has patient had a PCN reaction causing severe rash involving mucus membranes or skin necrosis: No Has patient had a PCN reaction that required hospitalization No Has patient had a PCN reaction occurring within the last 10 years: No If all of the above answers are "NO", then may proceed with Cephalosporin use.        Objective:    BP  108/76 (BP Location: Right Arm, Patient Position: Sitting, Cuff Size: Normal)   Pulse (!) 108   Temp 98 F (36.7 C) (Oral)   Ht 5\' 5"  (1.651 m)   Wt 132 lb 9.6 oz (60.1 kg)   LMP 02/18/2015 (Exact Date) Comment: MAYBE ONCE A YEAR  SpO2 97%   BMI 22.07 kg/m  Wt Readings from Last 3 Encounters:  03/05/18 132 lb 9.6 oz (60.1 kg)  02/28/18 128 lb 14.4 oz (58.5 kg)  01/21/18 127 lb 14.4 oz (58 kg)    Physical Exam Vitals signs  and nursing note reviewed.  Constitutional:      Appearance: She is well-developed.  HENT:     Head: Normocephalic and atraumatic.  Neck:     Musculoskeletal: Normal range of motion.  Cardiovascular:     Rate and Rhythm: Regular rhythm. Tachycardia present.     Heart sounds: Normal heart sounds. No murmur. No friction rub. No gallop.   Pulmonary:     Effort: Pulmonary effort is normal. Tachypnea present. No respiratory distress.     Breath sounds: Normal breath sounds. No decreased breath sounds, wheezing, rhonchi or rales.  Chest:     Chest wall: No tenderness.  Abdominal:     General: Bowel sounds are normal.     Palpations: Abdomen is soft.  Musculoskeletal: Normal range of motion.  Skin:    General: Skin is warm and dry.  Neurological:     Mental Status: She is alert and oriented to person, place, and time.     Coordination: Coordination normal.  Psychiatric:        Behavior: Behavior normal. Behavior is cooperative.        Thought Content: Thought content normal.        Judgment: Judgment normal.        Patient has been counseled extensively about nutrition and exercise as well as the importance of adherence with medications and regular follow-up. The patient was given clear instructions to go to ER or return to medical center if symptoms don't improve, worsen or new problems develop. The patient verbalized understanding.   Follow-up: Return for needs 3 week lab appointment and see me in 3 months. Gildardo Pounds, FNP-BC [facility] [place], [place]   03/07/2018, 9:29 PM

## 2018-03-05 NOTE — Telephone Encounter (Addendum)
Met with the patient when she was in the clinic today for her appointment.  Cab ride was provided for her to get to/from her appointment.   She explained that she lost her wallet on the bus this weekend  Has no money and will not be paid until next week. She did not have money to pay for her medications.  She will meet with [facility] program rep later this afternoon to assess eligibility for free medications. She will be able to pick up all medications that are being filled today for no charge. Bus pass was given to her to return to [facility] this afternoon.    She was given an application for the [employer] and [employer] .  Sabrina Holland, [facility] financial counselor instructed patient regarding which documents are needed for the applications. She will plan to collect the documents and schedule appointment with Financial Counselor within the next month.    She said that she has been denied medicaid multiple times and was agreeable with making a referral to [employer] [place] to assist with an appeal.  She completed the application and it was faxed to attention of Sabrina Holland.  She inquired about housing options and a printout of current residences available through [email or web address] was provided to her.  She stated that limit for her rent would be $550/month.   She also completed another [employer] application.

## 2018-03-05 NOTE — Telephone Encounter (Signed)
Call received from patient stating that she is not able to pick up her medications today and will need to come tomorrow.

## 2018-03-06 ENCOUNTER — Telehealth: Payer: Self-pay

## 2018-03-06 LAB — BASIC METABOLIC PANEL
BUN/Creatinine Ratio: 22 (ref 9–23)
BUN: 21 mg/dL (ref 6–24)
CO2: 25 mmol/L (ref 20–29)
Calcium: 9.4 mg/dL (ref 8.7–10.2)
Chloride: 99 mmol/L (ref 96–106)
Creatinine, Ser: 0.96 mg/dL (ref 0.57–1.00)
GFR calc non Af Amer: 70 mL/min/{1.73_m2} (ref >59)
GFR, EST AFRICAN AMERICAN: 80 mL/min/{1.73_m2} (ref >59)
Glucose: 58 mg/dL — ABNORMAL LOW (ref 65–99)
Potassium: 3.9 mmol/L (ref 3.5–5.2)
Sodium: 141 mmol/L (ref 134–144)

## 2018-03-06 NOTE — Telephone Encounter (Signed)
Met with the patient when she came to the clinic today to pick up her medications. She met with [facility] patient assistance program.  She was given all medications and completed paperwork for medication assistance. She was instructed to return as soon as possible with paystubs/tax forms to complete the application process. She was also shown by Willey Blade Olpe how to use her spiriva inhaler.    The patient reported that she received a call from [employer] [place].  They instructed her to apply for disability before applying for medicaid.  They would then be able to assist with the medicaid application process.  Also provided her with the booklets listing free meals and food pantries in [place].

## 2018-03-06 NOTE — Telephone Encounter (Signed)
[employer] application faxed to [employer] eligibility  

## 2018-03-07 ENCOUNTER — Encounter: Payer: Self-pay | Admitting: Nurse Practitioner

## 2018-03-07 ENCOUNTER — Telehealth: Payer: Self-pay

## 2018-03-07 NOTE — Telephone Encounter (Signed)
CMA attempt to call patient to inform on results.  No answer and phone line is busy unable to leave a VM.

## 2018-03-07 NOTE — Telephone Encounter (Signed)
-----   Message from Gildardo Pounds, NP sent at 03/06/2018 11:54 PM EST ----- Electrolytes have improved. Continue all medications as prescribed.

## 2018-03-08 NOTE — Telephone Encounter (Signed)
CMA spoke to patient to inform on results.  Pt. Verified DOB. Pt. Understood.  

## 2018-03-16 ENCOUNTER — Telehealth: Payer: Self-pay

## 2018-03-16 NOTE — Telephone Encounter (Signed)
Call placed to Courtney/[employer] who confirmed that she received the [employer] application

## 2018-03-16 NOTE — Telephone Encounter (Signed)
Message received from patient requesting a call back to confirm her next appointment. Call returned to patient and confirmed her lab appointment 03/26/2018 @ 0900.  She said that she has been working on taking care of herself and trying to get herself " back together.  She reported that her breathing is better. She said that she has worked some days but only 3-4 hours/day.  She also said that she is in the process of gathering documents for medication assistance/[place] Card/blue Card/ CFA. No concerns reported.

## 2018-03-20 ENCOUNTER — Inpatient Hospital Stay: Payer: Self-pay | Admitting: Critical Care Medicine

## 2018-03-26 ENCOUNTER — Other Ambulatory Visit: Payer: Self-pay

## 2018-03-29 ENCOUNTER — Telehealth: Payer: Self-pay

## 2018-03-29 NOTE — Telephone Encounter (Signed)
Attempted to contact patient to inqurie if she has been collecting the documents for the [employer] and CFA.  call placed to #  [phone] (M) and a message was left requesting the call back to this CM # [phone]

## 2018-04-02 ENCOUNTER — Encounter (HOSPITAL_COMMUNITY): Payer: Self-pay

## 2018-04-02 ENCOUNTER — Emergency Department (HOSPITAL_COMMUNITY): Payer: Self-pay

## 2018-04-02 ENCOUNTER — Other Ambulatory Visit: Payer: Self-pay

## 2018-04-02 ENCOUNTER — Emergency Department (HOSPITAL_COMMUNITY)
Admission: EM | Admit: 2018-04-02 | Discharge: 2018-04-02 | Disposition: A | Discharge status: Home / Self Care | Payer: Self-pay | Attending: Emergency Medicine | Admitting: Emergency Medicine

## 2018-04-02 DIAGNOSIS — Y929 Unspecified place or not applicable: Secondary | ICD-10-CM | POA: Insufficient documentation

## 2018-04-02 DIAGNOSIS — Z79899 Other long term (current) drug therapy: Secondary | ICD-10-CM | POA: Insufficient documentation

## 2018-04-02 DIAGNOSIS — Z8673 Personal history of transient ischemic attack (TIA), and cerebral infarction without residual deficits: Secondary | ICD-10-CM | POA: Insufficient documentation

## 2018-04-02 DIAGNOSIS — Y990 Civilian activity done for income or pay: Secondary | ICD-10-CM | POA: Insufficient documentation

## 2018-04-02 DIAGNOSIS — I110 Hypertensive heart disease with heart failure: Secondary | ICD-10-CM | POA: Insufficient documentation

## 2018-04-02 DIAGNOSIS — J449 Chronic obstructive pulmonary disease, unspecified: Secondary | ICD-10-CM | POA: Insufficient documentation

## 2018-04-02 DIAGNOSIS — I5042 Chronic combined systolic (congestive) and diastolic (congestive) heart failure: Secondary | ICD-10-CM | POA: Insufficient documentation

## 2018-04-02 DIAGNOSIS — F17210 Nicotine dependence, cigarettes, uncomplicated: Secondary | ICD-10-CM | POA: Insufficient documentation

## 2018-04-02 DIAGNOSIS — Z9012 Acquired absence of left breast and nipple: Secondary | ICD-10-CM | POA: Insufficient documentation

## 2018-04-02 DIAGNOSIS — Y939 Activity, unspecified: Secondary | ICD-10-CM | POA: Insufficient documentation

## 2018-04-02 DIAGNOSIS — W1830XA Fall on same level, unspecified, initial encounter: Secondary | ICD-10-CM | POA: Insufficient documentation

## 2018-04-02 DIAGNOSIS — Z853 Personal history of malignant neoplasm of breast: Secondary | ICD-10-CM | POA: Insufficient documentation

## 2018-04-02 DIAGNOSIS — S20211A Contusion of right front wall of thorax, initial encounter: Secondary | ICD-10-CM | POA: Insufficient documentation

## 2018-04-02 MED ORDER — CYCLOBENZAPRINE HCL 10 MG PO TABS: 10.0000 mg | ORAL_TABLET | Freq: Two times a day (BID) | ORAL | 0 refills | Status: DC | PRN

## 2018-04-02 MED ORDER — HYDROCODONE-ACETAMINOPHEN 5-325 MG PO TABS
1.0000 | ORAL_TABLET | Freq: Once | ORAL | Status: AC
  Administered 2018-04-02: 1 via ORAL
  Filled 2018-04-02: qty 1

## 2018-04-02 MED ORDER — IBUPROFEN 800 MG PO TABS: 800.0000 mg | ORAL_TABLET | Freq: Three times a day (TID) | ORAL | 0 refills | Status: DC | PRN

## 2018-04-02 NOTE — ED Triage Notes (Signed)
Pt is restricted on left arm due to mastectomy.

## 2018-04-02 NOTE — ED Triage Notes (Signed)
Per EMS, Pt fell while at work on her right side. Reports pain in right ribs on inspiration. Do obvious signs of respiratory distress.

## 2018-04-02 NOTE — ED Provider Notes (Signed)
[facility] DEPT Provider Note   CSN: [id number] Arrival date & time: 04/02/18  1834    History   Chief Complaint Chief Complaint  Patient presents with   Chest Pain    HPI Sabrina Holland is a 48 y.o. female.     The history is provided by the patient.  Chest Pain  Pain location:  R chest (right sided rib pain after fall) Pain quality: aching and dull   Pain radiates to:  Does not radiate Pain severity:  Mild Onset quality:  Sudden Timing:  Intermittent Progression:  Waxing and waning Context: lifting, movement and at rest   Relieved by:  Nothing Associated symptoms: no back pain and no palpitations     Past Medical History:  Diagnosis Date   Breast cancer ([place])    Breast cancer, stage 2 ([place]) 02/23/2011   COPD (chronic obstructive pulmonary disease) ([place])    Hypertension    TIA (transient ischemic attack) 11/25/2011   "first time" (11/25/2011)    Patient Active Problem List   Diagnosis Date Noted   Malnutrition of moderate degree 02/27/2018   Dyspnea 02/26/2018   Acute on chronic systolic CHF (congestive heart failure) ([place]) 01/20/2018   Acute on chronic systolic and diastolic heart failure, NYHA class 3 ([place]) 01/20/2018   Cocaine abuse ([place]) 12/19/2017   Mixed hyperlipidemia 12/19/2017   COPD exacerbation ([place])    Acute on chronic respiratory failure with hypoxemia ([place]) 12/03/2017   Cardiomyopathy ([place]) 07/04/2017   COPD (chronic obstructive pulmonary disease) ([place])    Chronic combined systolic and diastolic heart failure ([place])    Anemia 06/20/2017   Polysubstance abuse ([place]) 11/26/2011   TIA (transient ischemic attack) 11/25/2011   Essential hypertension 11/25/2011   Sinusitis 11/25/2011   Vitamin B 12 deficiency 07/18/2011   History of breast cancer 02/23/2011    Past Surgical History:  Procedure Laterality Date   BREAST BIOPSY  2007   left   MASTECTOMY  09/2005   left    TONSILLECTOMY AND ADENOIDECTOMY     "when I was little" (11/25/2011)   TUBAL LIGATION  1993     OB History    Gravida  5   Para  4   Term  4   Preterm  0   AB  1   Living        SAB  1   TAB  0   Ectopic  0   Multiple      Live Births               Home Medications    Prior to Admission medications   Medication Sig Start Date End Date Taking? Authorizing Provider  albuterol (PROVENTIL HFA) 108 (90 Base) MCG/ACT inhaler Inhale 1-2 puffs into the lungs every 6 (six) hours as needed for up to 1 day for wheezing or shortness of breath (cough). 03/05/18 03/06/18  Gildardo Pounds, NP  albuterol (PROVENTIL) (2.5 MG/3ML) 0.083% nebulizer solution Take 3 mLs (2.5 mg total) by nebulization every 6 (six) hours as needed for wheezing or shortness of breath. 03/05/18   Gildardo Pounds, NP  amLODipine (NORVASC) 5 MG tablet Take 1 tablet (5 mg total) by mouth daily. 03/05/18 06/03/18  Gildardo Pounds, NP  aspirin EC 81 MG EC tablet Take 1 tablet (81 mg total) by mouth daily. Patient not taking: Reported on 03/05/2018 06/24/17   Reyne Dumas, MD  atorvastatin (LIPITOR) 40 MG tablet Take 1 tablet (40 mg total)  by mouth daily at 6 PM. 03/05/18   Gildardo Pounds, NP  budesonide-formoterol [facility]) 80-4.5 MCG/ACT inhaler Inhale 2 puffs into the lungs 2 (two) times daily. 03/05/18   Gildardo Pounds, NP  cyclobenzaprine (FLEXERIL) 10 MG tablet Take 1 tablet (10 mg total) by mouth 2 (two) times daily as needed for up to 10 doses for muscle spasms. 04/02/18   Prudence Heiny, DO  famotidine (PEPCID) 20 MG tablet Take 1 tablet (20 mg total) by mouth 2 (two) times daily. Patient not taking: Reported on 03/05/2018 12/04/17   Eugenie Filler, MD  fluticasone [facility]) 50 MCG/ACT nasal spray Place 2 sprays into both nostrils daily. Patient not taking: Reported on 03/05/2018 12/05/17   Eugenie Filler, MD  furosemide (LASIX) 20 MG tablet Take 1 tablet (20 mg total) by mouth 2 (two) times daily.  03/05/18 06/03/18  Gildardo Pounds, NP  ibuprofen (ADVIL,MOTRIN) 800 MG tablet Take 1 tablet (800 mg total) by mouth every 8 (eight) hours as needed for up to 30 doses. 04/02/18   Rukiya Hodgkins, DO  lisinopril (PRINIVIL,ZESTRIL) 20 MG tablet Take 1 tablet (20 mg total) by mouth daily. 03/05/18 06/03/18  Gildardo Pounds, NP  Multiple Vitamin (MULTIVITAMIN WITH MINERALS) TABS tablet Take 1 tablet by mouth daily.    [provider]  nicotine (NICODERM CQ - DOSED IN MG/24 HOURS) 14 mg/24hr patch Place 1 patch (14 mg total) onto the skin daily. 03/05/18   Gildardo Pounds, NP  omeprazole (PRILOSEC) 20 MG capsule Take 1 capsule (20 mg total) by mouth daily. 03/05/18 06/03/18  Gildardo Pounds, NP  potassium chloride SA (K-DUR,KLOR-CON) 20 MEQ tablet Take 1 tablet (20 mEq total) by mouth daily for 14 days. 03/05/18 03/19/18  Gildardo Pounds, NP  spironolactone (ALDACTONE) 25 MG tablet Take 1 tablet (25 mg total) by mouth daily. Patient not taking: Reported on 03/05/2018 12/04/17   Eugenie Filler, MD  tiotropium (SPIRIVA HANDIHALER) 18 MCG inhalation capsule Place 1 capsule (18 mcg total) into inhaler and inhale daily. 03/05/18 03/05/19  Gildardo Pounds, NP    Family History Family History  Problem Relation Age of Onset   COPD Father    Sickle cell anemia Cousin        maternal cousin    Social History Social History   Tobacco Use   Smoking status: Current Some Day Smoker    Packs/day: 0.00    Years: 20.00    Pack years: 0.00    Types: Cigarettes   Smokeless tobacco: Never Used   Tobacco comment: 1 or 2 cigarettes a day- approx 1 pack week  Substance Use Topics   Alcohol use: Yes    Alcohol/week: 12.0 standard drinks    Types: 12 Cans of beer per week    Comment: occasionally   Drug use: Yes    Types: Marijuana, Cocaine    Comment: patient states she uses cocaine and marijuana daily     Allergies   Penicillins   Review of Systems Review of Systems  Cardiovascular:  Positive for chest pain. Negative for palpitations.  Musculoskeletal: Negative for arthralgias, back pain, gait problem, joint swelling, myalgias, neck pain and neck stiffness.  Skin: Negative for color change, pallor, rash and wound.     Physical Exam Update vital signs  Today's Vitals   04/02/18 1842 04/02/18 1845  SpO2: 97%   PainSc:  10-Worst pain ever   There is no height or weight on file to calculate BMI.  Physical Exam Cardiovascular:     Heart sounds: Normal heart sounds.  Pulmonary:     Effort: Pulmonary effort is normal. No respiratory distress.     Breath sounds: Normal breath sounds. No decreased breath sounds, wheezing, rhonchi or rales.  Chest:     Chest wall: Tenderness (right side of ribs) present. No crepitus.  Skin:    General: Skin is warm and dry.     Capillary Refill: Capillary refill takes less than 2 seconds.      ED Treatments / Results  Labs (all labs ordered are listed, but only abnormal results are displayed) Labs Reviewed - No data to display  EKG None  Radiology Dg Chest 2 View  Result Date: 04/02/2018 CLINICAL DATA:  [place] today causing RIGHT axillary rib pain, history breast cancer post mastectomy, COPD, hypertension, smoker EXAM: CHEST - 2 VIEW COMPARISON:  02/26/2018 FINDINGS: Enlargement of cardiac silhouette with pulmonary vascular congestion. Mediastinal contours stable with prominent RIGHT paratracheal soft tissues unchanged since 11/07/2017, corresponding to prominent brachiocephalic vein. Linear subsegmental atelectasis at medial RIGHT lung base. Underlying emphysematous changes. No definite acute infiltrate, pleural effusion or pneumothorax. Osseous structures demineralized without definite fracture. IMPRESSION: Enlargement of cardiac silhouette with pulmonary vascular congestion. Emphysematous changes with subsegmental atelectasis RIGHT lower lobe. No definite acute infiltrate. Electronically Signed   By: Lavonia Dana M.D.   On:  04/02/2018 19:25    Procedures Procedures (including critical care time)  Medications Ordered in ED Medications  HYDROcodone-acetaminophen (NORCO/VICODIN) 5-325 MG per tablet 1 tablet (1 tablet Oral Given 04/02/18 1857)     Initial Impression / Assessment and Plan / ED Course  I have reviewed the triage vital signs and the nursing notes.  Pertinent labs & imaging results that were available during my care of the patient were reviewed by me and considered in my medical decision making (see chart for details).     Sabrina Holland is a 48 year old female with history of COPD who presents to the ED with right rib pain after fall.  Patient with normal vitals.  No fever.  Tender over the right side of the ribs.  Clear breath sounds on exam.  Chest x-ray showed no signs of pneumonia, pneumothorax, rib fractures.  Patient was given Norco with improvement.  Given prescription for Motrin, Flexeril.  Written off work for a few days. Given incentive spirometer. Discharged in ED in good condition.  Given return precautions.  This chart was dictated using voice recognition software.  Despite best efforts to proofread,  errors can occur which can change the documentation meaning.   Final Clinical Impressions(s) / ED Diagnoses   Final diagnoses:  Contusion of rib on right side, initial encounter    ED Discharge Orders         Ordered    ibuprofen (ADVIL,MOTRIN) 800 MG tablet  Every 8 hours PRN     04/02/18 1941    cyclobenzaprine (FLEXERIL) 10 MG tablet  2 times daily PRN     04/02/18 1941           Lennice Sites, DO 04/02/18 1944

## 2018-04-05 ENCOUNTER — Telehealth: Payer: Self-pay

## 2018-04-05 NOTE — Telephone Encounter (Signed)
lmtcb

## 2018-04-09 ENCOUNTER — Telehealth: Payer: Self-pay

## 2018-04-09 ENCOUNTER — Ambulatory Visit: Payer: Self-pay | Admitting: Cardiovascular Disease

## 2018-04-09 NOTE — Telephone Encounter (Signed)
Message received from patient stating that she lost her phone and she can be reached at # [phone].  Call returned to patient and message left with Reggie requesting a call back from patient  to this CM.

## 2018-04-10 ENCOUNTER — Encounter: Payer: Self-pay | Admitting: *Deleted

## 2018-04-10 MED FILL — FUROSEMIDE 20 MG TABLET: 20 | 30 days supply | Qty: 60 | Fill #1

## 2018-04-11 ENCOUNTER — Telehealth: Payer: Self-pay

## 2018-04-11 NOTE — Telephone Encounter (Signed)
Call received from patient stating that she would be at [facility] by 1715 today to pick up the lasix.

## 2018-04-11 NOTE — Telephone Encounter (Signed)
Call received from patient stating that she ran out of lasix and she feels that she is starting to build up fluid.  She stated that she was only given 60 lasix tablets when she picked up the initial prescription, not 180 that were ordered, and she has not been able to get through to the pharmacy. She also noted that she is on her way to work.   As per Feliz Beam, Pharmacy Tech, the patient has a lasix refill  ready for pick up.  Attempted to contact the patient  # [phone] to inform her that she can pick up the prescription today at [facility].  Left message with Reggie to have her return call to this CM # [phone].

## 2018-04-11 NOTE — Telephone Encounter (Signed)
NOTED

## 2018-04-16 ENCOUNTER — Emergency Department (HOSPITAL_COMMUNITY): Payer: Self-pay

## 2018-04-16 ENCOUNTER — Inpatient Hospital Stay (HOSPITAL_COMMUNITY)
Admission: EM | Admit: 2018-04-16 | Discharge: 2018-04-18 | DRG: 292 | Disposition: A | Discharge status: Home / Self Care | Payer: Self-pay | Attending: Internal Medicine | Admitting: Internal Medicine

## 2018-04-16 ENCOUNTER — Encounter (HOSPITAL_COMMUNITY): Payer: Self-pay | Admitting: Internal Medicine

## 2018-04-16 ENCOUNTER — Other Ambulatory Visit: Payer: Self-pay

## 2018-04-16 DIAGNOSIS — Z853 Personal history of malignant neoplasm of breast: Secondary | ICD-10-CM

## 2018-04-16 DIAGNOSIS — R7989 Other specified abnormal findings of blood chemistry: Secondary | ICD-10-CM | POA: Diagnosis present

## 2018-04-16 DIAGNOSIS — Z79899 Other long term (current) drug therapy: Secondary | ICD-10-CM

## 2018-04-16 DIAGNOSIS — R748 Abnormal levels of other serum enzymes: Secondary | ICD-10-CM | POA: Diagnosis present

## 2018-04-16 DIAGNOSIS — I5023 Acute on chronic systolic (congestive) heart failure: Secondary | ICD-10-CM | POA: Diagnosis present

## 2018-04-16 DIAGNOSIS — Z791 Long term (current) use of non-steroidal anti-inflammatories (NSAID): Secondary | ICD-10-CM

## 2018-04-16 DIAGNOSIS — D509 Iron deficiency anemia, unspecified: Secondary | ICD-10-CM | POA: Diagnosis present

## 2018-04-16 DIAGNOSIS — F17210 Nicotine dependence, cigarettes, uncomplicated: Secondary | ICD-10-CM | POA: Diagnosis present

## 2018-04-16 DIAGNOSIS — Z9851 Tubal ligation status: Secondary | ICD-10-CM

## 2018-04-16 DIAGNOSIS — I110 Hypertensive heart disease with heart failure: Principal | ICD-10-CM | POA: Diagnosis present

## 2018-04-16 DIAGNOSIS — F1410 Cocaine abuse, uncomplicated: Secondary | ICD-10-CM | POA: Diagnosis present

## 2018-04-16 DIAGNOSIS — I509 Heart failure, unspecified: Secondary | ICD-10-CM

## 2018-04-16 DIAGNOSIS — E785 Hyperlipidemia, unspecified: Secondary | ICD-10-CM | POA: Diagnosis present

## 2018-04-16 DIAGNOSIS — Z880 Allergy status to penicillin: Secondary | ICD-10-CM

## 2018-04-16 DIAGNOSIS — Z8673 Personal history of transient ischemic attack (TIA), and cerebral infarction without residual deficits: Secondary | ICD-10-CM

## 2018-04-16 DIAGNOSIS — I5043 Acute on chronic combined systolic (congestive) and diastolic (congestive) heart failure: Secondary | ICD-10-CM | POA: Diagnosis present

## 2018-04-16 DIAGNOSIS — K219 Gastro-esophageal reflux disease without esophagitis: Secondary | ICD-10-CM | POA: Diagnosis present

## 2018-04-16 DIAGNOSIS — J449 Chronic obstructive pulmonary disease, unspecified: Secondary | ICD-10-CM | POA: Diagnosis present

## 2018-04-16 DIAGNOSIS — R0603 Acute respiratory distress: Secondary | ICD-10-CM | POA: Diagnosis present

## 2018-04-16 DIAGNOSIS — Z825 Family history of asthma and other chronic lower respiratory diseases: Secondary | ICD-10-CM

## 2018-04-16 DIAGNOSIS — D649 Anemia, unspecified: Secondary | ICD-10-CM | POA: Diagnosis present

## 2018-04-16 DIAGNOSIS — I10 Essential (primary) hypertension: Secondary | ICD-10-CM

## 2018-04-16 DIAGNOSIS — E876 Hypokalemia: Secondary | ICD-10-CM

## 2018-04-16 DIAGNOSIS — F1910 Other psychoactive substance abuse, uncomplicated: Secondary | ICD-10-CM | POA: Diagnosis present

## 2018-04-16 DIAGNOSIS — Z7982 Long term (current) use of aspirin: Secondary | ICD-10-CM

## 2018-04-16 DIAGNOSIS — I248 Other forms of acute ischemic heart disease: Secondary | ICD-10-CM | POA: Diagnosis present

## 2018-04-16 DIAGNOSIS — I471 Supraventricular tachycardia: Secondary | ICD-10-CM | POA: Diagnosis present

## 2018-04-16 DIAGNOSIS — Z832 Family history of diseases of the blood and blood-forming organs and certain disorders involving the immune mechanism: Secondary | ICD-10-CM

## 2018-04-16 DIAGNOSIS — Z9010 Acquired absence of unspecified breast and nipple: Secondary | ICD-10-CM

## 2018-04-16 DIAGNOSIS — F1010 Alcohol abuse, uncomplicated: Secondary | ICD-10-CM | POA: Diagnosis present

## 2018-04-16 DIAGNOSIS — R945 Abnormal results of liver function studies: Secondary | ICD-10-CM

## 2018-04-16 LAB — COMPREHENSIVE METABOLIC PANEL
ALT: 18 U/L (ref 0–44)
AST: 49 U/L — ABNORMAL HIGH (ref 15–41)
Albumin: 3.2 g/dL — ABNORMAL LOW (ref 3.5–5.0)
Alkaline Phosphatase: 91 U/L (ref 38–126)
Anion gap: 14 (ref 5–15)
BUN: 8 mg/dL (ref 6–20)
CHLORIDE: 99 mmol/L (ref 98–111)
CO2: 21 mmol/L — ABNORMAL LOW (ref 22–32)
Calcium: 8.4 mg/dL — ABNORMAL LOW (ref 8.9–10.3)
Creatinine, Ser: 0.83 mg/dL (ref 0.44–1.00)
GFR calc Af Amer: >60 mL/min (ref >60)
GFR calc non Af Amer: >60 mL/min (ref >60)
Glucose, Bld: 126 mg/dL — ABNORMAL HIGH (ref 70–99)
Potassium: 3.8 mmol/L (ref 3.5–5.1)
Sodium: 134 mmol/L — ABNORMAL LOW (ref 135–145)
Total Bilirubin: 2.1 mg/dL — ABNORMAL HIGH (ref 0.3–1.2)
Total Protein: 6 g/dL — ABNORMAL LOW (ref 6.5–8.1)

## 2018-04-16 LAB — CBC
HCT: 35.7 % — ABNORMAL LOW (ref 36.0–46.0)
Hemoglobin: 11.3 g/dL — ABNORMAL LOW (ref 12.0–15.0)
MCH: 23.3 pg — ABNORMAL LOW (ref 26.0–34.0)
MCHC: 31.7 g/dL (ref 30.0–36.0)
MCV: 73.6 fL — ABNORMAL LOW (ref 80.0–100.0)
Platelets: 415 10*3/uL — ABNORMAL HIGH (ref 150–400)
RBC: 4.85 MIL/uL (ref 3.87–5.11)
RDW: 19.2 % — ABNORMAL HIGH (ref 11.5–15.5)
WBC: 5.8 10*3/uL (ref 4.0–10.5)
nRBC: 0 % (ref 0.0–0.2)

## 2018-04-16 LAB — POCT I-STAT 7, (LYTES, BLD GAS, ICA,H+H)
Acid-Base Excess: 3 mmol/L — ABNORMAL HIGH (ref 0.0–2.0)
Bicarbonate: 27.2 mmol/L (ref 20.0–28.0)
Calcium, Ion: 1.15 mmol/L (ref 1.15–1.40)
HCT: 40 % (ref 36.0–46.0)
Hemoglobin: 13.6 g/dL (ref 12.0–15.0)
O2 Saturation: 97 %
PCO2 ART: 40.6 mmHg (ref 32.0–48.0)
Potassium: 3.1 mmol/L — ABNORMAL LOW (ref 3.5–5.1)
Sodium: 135 mmol/L (ref 135–145)
TCO2: 28 mmol/L (ref 22–32)
pH, Arterial: 7.433 (ref 7.350–7.450)
pO2, Arterial: 90 mmHg (ref 83.0–108.0)

## 2018-04-16 LAB — MAGNESIUM: Magnesium: 1.7 mg/dL (ref 1.7–2.4)

## 2018-04-16 LAB — BRAIN NATRIURETIC PEPTIDE: B Natriuretic Peptide: 1857.5 pg/mL — ABNORMAL HIGH (ref 0.0–100.0)

## 2018-04-16 LAB — TROPONIN I
Troponin I: 0.05 ng/mL (ref <0.03)
Troponin I: 0.05 ng/mL (ref <0.03)
Troponin I: 0.05 ng/mL (ref <0.03)
Troponin I: 0.05 ng/mL (ref <0.03)

## 2018-04-16 MED ORDER — POTASSIUM CHLORIDE CRYS ER 20 MEQ PO TBCR
20.0000 meq | EXTENDED_RELEASE_TABLET | Freq: Every day | ORAL | Status: DC
  Administered 2018-04-16 – 2018-04-17 (×2): 20 meq via ORAL
  Filled 2018-04-16 (×2): qty 1

## 2018-04-16 MED ORDER — TIOTROPIUM BROMIDE MONOHYDRATE 18 MCG IN CAPS: 18.0000 ug | ORAL_CAPSULE | Freq: Every day | RESPIRATORY_TRACT | Status: DC

## 2018-04-16 MED ORDER — LISINOPRIL 20 MG PO TABS
20.0000 mg | ORAL_TABLET | Freq: Every day | ORAL | Status: DC
  Administered 2018-04-16 – 2018-04-18 (×3): 20 mg via ORAL
  Filled 2018-04-16 (×3): qty 1

## 2018-04-16 MED ORDER — THIAMINE HCL 100 MG/ML IJ SOLN: 100.0000 mg | Freq: Every day | INTRAMUSCULAR | Status: DC

## 2018-04-16 MED ORDER — ACETAMINOPHEN 325 MG PO TABS
650.0000 mg | ORAL_TABLET | Freq: Four times a day (QID) | ORAL | Status: DC | PRN
  Administered 2018-04-16: 650 mg via ORAL
  Filled 2018-04-16: qty 2

## 2018-04-16 MED ORDER — ASPIRIN 81 MG PO CHEW
324.0000 mg | CHEWABLE_TABLET | Freq: Once | ORAL | Status: AC
  Administered 2018-04-16: 324 mg via ORAL
  Filled 2018-04-16: qty 4

## 2018-04-16 MED ORDER — ONDANSETRON HCL 4 MG/2ML IJ SOLN
4.0000 mg | Freq: Four times a day (QID) | INTRAMUSCULAR | Status: DC | PRN
  Administered 2018-04-16: 4 mg via INTRAVENOUS
  Filled 2018-04-16: qty 2

## 2018-04-16 MED ORDER — MORPHINE SULFATE (PF) 2 MG/ML IV SOLN
1.0000 mg | INTRAVENOUS | Status: DC | PRN
  Administered 2018-04-16 (×2): 2 mg via INTRAVENOUS
  Filled 2018-04-16 (×2): qty 1

## 2018-04-16 MED ORDER — PANTOPRAZOLE SODIUM 40 MG PO TBEC
40.0000 mg | DELAYED_RELEASE_TABLET | Freq: Every day | ORAL | Status: DC
  Administered 2018-04-16 – 2018-04-18 (×3): 40 mg via ORAL
  Filled 2018-04-16 (×3): qty 1

## 2018-04-16 MED ORDER — LORAZEPAM 2 MG/ML IJ SOLN: 1.0000 mg | Freq: Four times a day (QID) | INTRAMUSCULAR | Status: DC | PRN

## 2018-04-16 MED ORDER — LORAZEPAM 1 MG PO TABS: 1.0000 mg | ORAL_TABLET | Freq: Four times a day (QID) | ORAL | Status: DC | PRN

## 2018-04-16 MED ORDER — ATORVASTATIN CALCIUM 40 MG PO TABS
40.0000 mg | ORAL_TABLET | Freq: Every day | ORAL | Status: DC
  Administered 2018-04-16 – 2018-04-17 (×2): 40 mg via ORAL
  Filled 2018-04-16 (×2): qty 1

## 2018-04-16 MED ORDER — AMLODIPINE BESYLATE 5 MG PO TABS
5.0000 mg | ORAL_TABLET | Freq: Every day | ORAL | Status: DC
  Administered 2018-04-16 – 2018-04-18 (×3): 5 mg via ORAL
  Filled 2018-04-16 (×3): qty 1

## 2018-04-16 MED ORDER — ASPIRIN EC 81 MG PO TBEC
81.0000 mg | DELAYED_RELEASE_TABLET | Freq: Every day | ORAL | Status: DC
  Administered 2018-04-17 – 2018-04-18 (×2): 81 mg via ORAL
  Filled 2018-04-16 (×2): qty 1

## 2018-04-16 MED ORDER — LEVALBUTEROL HCL 1.25 MG/0.5ML IN NEBU: 1.2500 mg | INHALATION_SOLUTION | Freq: Once | RESPIRATORY_TRACT | Status: DC

## 2018-04-16 MED ORDER — ADULT MULTIVITAMIN W/MINERALS CH
1.0000 | ORAL_TABLET | Freq: Every day | ORAL | Status: DC
  Administered 2018-04-16 – 2018-04-18 (×3): 1 via ORAL
  Filled 2018-04-16 (×3): qty 1

## 2018-04-16 MED ORDER — VITAMIN B-1 100 MG PO TABS
100.0000 mg | ORAL_TABLET | Freq: Every day | ORAL | Status: DC
  Administered 2018-04-16 – 2018-04-18 (×3): 100 mg via ORAL
  Filled 2018-04-16 (×3): qty 1

## 2018-04-16 MED ORDER — ENOXAPARIN SODIUM 40 MG/0.4ML ~~LOC~~ SOLN
40.0000 mg | Freq: Every day | SUBCUTANEOUS | Status: DC
  Administered 2018-04-16 – 2018-04-17 (×2): 40 mg via SUBCUTANEOUS
  Filled 2018-04-16 (×4): qty 0.4

## 2018-04-16 MED ORDER — ACETAMINOPHEN 500 MG PO TABS
1000.0000 mg | ORAL_TABLET | Freq: Once | ORAL | Status: AC
  Administered 2018-04-16: 1000 mg via ORAL
  Filled 2018-04-16: qty 2

## 2018-04-16 MED ORDER — ALBUTEROL SULFATE (2.5 MG/3ML) 0.083% IN NEBU: 2.5000 mg | INHALATION_SOLUTION | Freq: Four times a day (QID) | RESPIRATORY_TRACT | Status: DC | PRN

## 2018-04-16 MED ORDER — UMECLIDINIUM BROMIDE 62.5 MCG/INH IN AEPB
1.0000 | INHALATION_SPRAY | Freq: Every day | RESPIRATORY_TRACT | Status: DC
  Administered 2018-04-17 – 2018-04-18 (×2): 1 via RESPIRATORY_TRACT
  Filled 2018-04-16: qty 7

## 2018-04-16 MED ORDER — SPIRONOLACTONE 25 MG PO TABS
25.0000 mg | ORAL_TABLET | Freq: Every day | ORAL | Status: DC
  Administered 2018-04-16 – 2018-04-18 (×3): 25 mg via ORAL
  Filled 2018-04-16 (×4): qty 1

## 2018-04-16 MED ORDER — FUROSEMIDE 10 MG/ML IJ SOLN
40.0000 mg | Freq: Once | INTRAMUSCULAR | Status: AC
  Administered 2018-04-16: 40 mg via INTRAVENOUS
  Filled 2018-04-16: qty 4

## 2018-04-16 MED ORDER — ONDANSETRON HCL 4 MG PO TABS: 4.0000 mg | ORAL_TABLET | Freq: Four times a day (QID) | ORAL | Status: DC | PRN

## 2018-04-16 MED ORDER — FOLIC ACID 1 MG PO TABS
1.0000 mg | ORAL_TABLET | Freq: Every day | ORAL | Status: DC
  Administered 2018-04-16 – 2018-04-18 (×3): 1 mg via ORAL
  Filled 2018-04-16 (×3): qty 1

## 2018-04-16 MED ORDER — FUROSEMIDE 10 MG/ML IJ SOLN
40.0000 mg | Freq: Two times a day (BID) | INTRAMUSCULAR | Status: DC
  Administered 2018-04-16 – 2018-04-17 (×3): 40 mg via INTRAVENOUS
  Filled 2018-04-16 (×3): qty 4

## 2018-04-16 MED ORDER — ACETAMINOPHEN 650 MG RE SUPP: 650.0000 mg | Freq: Four times a day (QID) | RECTAL | Status: DC | PRN

## 2018-04-16 MED ORDER — FENTANYL CITRATE (PF) 100 MCG/2ML IJ SOLN
50.0000 ug | Freq: Once | INTRAMUSCULAR | Status: AC
  Administered 2018-04-16: 50 ug via INTRAVENOUS
  Filled 2018-04-16: qty 2

## 2018-04-16 MED ORDER — MOMETASONE FURO-FORMOTEROL FUM 100-5 MCG/ACT IN AERO
2.0000 | INHALATION_SPRAY | Freq: Two times a day (BID) | RESPIRATORY_TRACT | Status: DC
  Administered 2018-04-17 – 2018-04-18 (×3): 2 via RESPIRATORY_TRACT
  Filled 2018-04-16: qty 8.8

## 2018-04-16 NOTE — H&P (Signed)
History and Physical    Sabrina Holland GYK:[id number] DOB: 31-Oct-1970 DOA: 04/16/2018  PCP: Gildardo Pounds, NP  Patient coming from: Home.  Chief Complaint: Shortness of breath.  HPI: Sabrina Holland is a 48 y.o. female with history of chronic systolic heart failure last EF measured in November 2019 was 25 to 30% with COPD polysubstance abuse last taken cocaine 4 days ago presents to the ER because of increasing shortness of breath.  Patient states she is short of breath last 2 to 3 days because she missed her Lasix dose which she lost about to get a refill.  Also has been having chest tightness.  Denies any fever chills or any sick contacts.  Denies any recent travel.  States he has gained at least 6 pounds in the last 2 to 3 days.  ED Course: In the ER patient's BNP is elevated troponin mildly red EKG shows LVH findings.  Chest x-ray shows congestion.  Was given Lasix 40 mg IV admitted for acute CHF.  Review of Systems: As per HPI, rest all negative.   Past Medical History:  Diagnosis Date  . Breast cancer ([place])   . Breast cancer, stage 2 ([place]) 02/23/2011  . COPD (chronic obstructive pulmonary disease) ([place])   . Hypertension   . TIA (transient ischemic attack) 11/25/2011   "first time" (11/25/2011)    Past Surgical History:  Procedure Laterality Date  . BREAST BIOPSY  2007   left  . MASTECTOMY  09/2005   left  . TONSILLECTOMY AND ADENOIDECTOMY     "when I was little" (11/25/2011)  . TUBAL LIGATION  1993     reports that she has been smoking cigarettes. She has been smoking about 0.00 packs per day for the past 20.00 years. She has never used smokeless tobacco. She reports current alcohol use of about 12.0 standard drinks of alcohol per week. She reports current drug use. Drugs: Marijuana and Cocaine.  Allergies  Allergen Reactions  . Penicillins Other (See Comments)    Pt had slight throat swelling Has patient had a PCN reaction causing immediate rash,  facial/tongue/throat swelling, SOB or lightheadedness with hypotension: yes Has patient had a PCN reaction causing severe rash involving mucus membranes or skin necrosis: No Has patient had a PCN reaction that required hospitalization No Has patient had a PCN reaction occurring within the last 10 years: No If all of the above answers are "NO", then may proceed with Cephalosporin use.     Family History  Problem Relation Age of Onset  . COPD Father   . Sickle cell anemia Cousin        maternal cousin    Prior to Admission medications   Medication Sig Start Date End Date Taking? Authorizing Provider  albuterol (PROVENTIL HFA) 108 (90 Base) MCG/ACT inhaler Inhale 1-2 puffs into the lungs every 6 (six) hours as needed for up to 1 day for wheezing or shortness of breath (cough). 03/05/18 03/06/18  Gildardo Pounds, NP  albuterol (PROVENTIL) (2.5 MG/3ML) 0.083% nebulizer solution Take 3 mLs (2.5 mg total) by nebulization every 6 (six) hours as needed for wheezing or shortness of breath. 03/05/18   Gildardo Pounds, NP  amLODipine (NORVASC) 5 MG tablet Take 1 tablet (5 mg total) by mouth daily. 03/05/18 06/03/18  Gildardo Pounds, NP  aspirin EC 81 MG EC tablet Take 1 tablet (81 mg total) by mouth daily. Patient not taking: Reported on 03/05/2018 06/24/17   Reyne Dumas, MD  atorvastatin (LIPITOR) 40 MG tablet Take 1 tablet (40 mg total) by mouth daily at 6 PM. 03/05/18   Gildardo Pounds, NP  budesonide-formoterol (SYMBICORT) 80-4.5 MCG/ACT inhaler Inhale 2 puffs into the lungs 2 (two) times daily. 03/05/18   Gildardo Pounds, NP  cyclobenzaprine (FLEXERIL) 10 MG tablet Take 1 tablet (10 mg total) by mouth 2 (two) times daily as needed for up to 10 doses for muscle spasms. 04/02/18   Curatolo, Adam, DO  famotidine (PEPCID) 20 MG tablet Take 1 tablet (20 mg total) by mouth 2 (two) times daily. Patient not taking: Reported on 03/05/2018 12/04/17   Eugenie Filler, MD  fluticasone [facility]) 50 MCG/ACT  nasal spray Place 2 sprays into both nostrils daily. Patient not taking: Reported on 03/05/2018 12/05/17   Eugenie Filler, MD  furosemide (LASIX) 20 MG tablet Take 1 tablet (20 mg total) by mouth 2 (two) times daily. 03/05/18 06/03/18  Gildardo Pounds, NP  ibuprofen (ADVIL,MOTRIN) 800 MG tablet Take 1 tablet (800 mg total) by mouth every 8 (eight) hours as needed for up to 30 doses. 04/02/18   Curatolo, Adam, DO  lisinopril (PRINIVIL,ZESTRIL) 20 MG tablet Take 1 tablet (20 mg total) by mouth daily. 03/05/18 06/03/18  Gildardo Pounds, NP  Multiple Vitamin (MULTIVITAMIN WITH MINERALS) TABS tablet Take 1 tablet by mouth daily.    [provider]  nicotine (NICODERM CQ - DOSED IN MG/24 HOURS) 14 mg/24hr patch Place 1 patch (14 mg total) onto the skin daily. 03/05/18   Gildardo Pounds, NP  omeprazole (PRILOSEC) 20 MG capsule Take 1 capsule (20 mg total) by mouth daily. 03/05/18 06/03/18  Gildardo Pounds, NP  potassium chloride SA (K-DUR,KLOR-CON) 20 MEQ tablet Take 1 tablet (20 mEq total) by mouth daily for 14 days. 03/05/18 03/19/18  Gildardo Pounds, NP  spironolactone (ALDACTONE) 25 MG tablet Take 1 tablet (25 mg total) by mouth daily. Patient not taking: Reported on 03/05/2018 12/04/17   Eugenie Filler, MD  tiotropium (SPIRIVA HANDIHALER) 18 MCG inhalation capsule Place 1 capsule (18 mcg total) into inhaler and inhale daily. 03/05/18 03/05/19  Gildardo Pounds, NP    Physical Exam: Vitals:   04/16/18 0125 04/16/18 0215 04/16/18 0300 04/16/18 0345  BP:  (!) 126/108 (!) 136/94 (!) 134/104  Pulse:  (!) 121 (!) 115 (!) 110  Resp:  (!) 32 (!) 31 (!) 26  Temp:      TempSrc:      SpO2:  99% 97% 98%  Weight: 56.7 kg     Height:          Constitutional: Moderately built and nourished. Vitals:   04/16/18 0125 04/16/18 0215 04/16/18 0300 04/16/18 0345  BP:  (!) 126/108 (!) 136/94 (!) 134/104  Pulse:  (!) 121 (!) 115 (!) 110  Resp:  (!) 32 (!) 31 (!) 26  Temp:      TempSrc:      SpO2:   99% 97% 98%  Weight: 56.7 kg     Height:       Eyes: Anicteric no pallor. ENMT: No discharge from the ears eyes nose and mouth. Neck: JVD elevated no mass felt. Respiratory: No rhonchi or crepitations. Cardiovascular: S1-S2 heard. Abdomen: Soft nontender bowel sounds are seen. Musculoskeletal: Mild edema. Skin: No rash. Neurologic: Alert awake oriented to time place and person.  Moves all extremities. Psychiatric: Appears normal.   Labs on Admission: I have personally reviewed following labs and imaging studies  CBC: Recent  Labs  Lab 04/16/18 0050 04/16/18 0149  WBC 5.8  --   HGB 11.3* 13.6  HCT 35.7* 40.0  MCV 73.6*  --   PLT 415*  --    Basic Metabolic Panel: Recent Labs  Lab 04/16/18 0050 04/16/18 0149  NA 134* 135  K 3.8 3.1*  CL 99  --   CO2 21*  --   GLUCOSE 126*  --   BUN 8  --   CREATININE 0.83  --   CALCIUM 8.4*  --   MG 1.7  --    GFR: Estimated Creatinine Clearance: 73.4 mL/min (by C-G formula based on SCr of 0.83 mg/dL). Liver Function Tests: Recent Labs  Lab 04/16/18 0050  AST 49*  ALT 18  ALKPHOS 91  BILITOT 2.1*  PROT 6.0*  ALBUMIN 3.2*   No results for input(s): LIPASE, AMYLASE in the last 168 hours. No results for input(s): AMMONIA in the last 168 hours. Coagulation Profile: No results for input(s): INR, PROTIME in the last 168 hours. Cardiac Enzymes: Recent Labs  Lab 04/16/18 0050  TROPONINI 0.05*   BNP (last 3 results) No results for input(s): PROBNP in the last 8760 hours. HbA1C: No results for input(s): HGBA1C in the last 72 hours. CBG: No results for input(s): GLUCAP in the last 168 hours. Lipid Profile: No results for input(s): CHOL, HDL, LDLCALC, TRIG, CHOLHDL, LDLDIRECT in the last 72 hours. Thyroid Function Tests: No results for input(s): TSH, T4TOTAL, FREET4, T3FREE, THYROIDAB in the last 72 hours. Anemia Panel: No results for input(s): VITAMINB12, FOLATE, FERRITIN, TIBC, IRON, RETICCTPCT in the last 72 hours.  Urine analysis:    Component Value Date/Time   COLORURINE STRAW (A) 02/26/2018 1032   APPEARANCEUR CLEAR 02/26/2018 1032   LABSPEC 1.005 02/26/2018 1032   LABSPEC 1.015 11/29/2007 1119   PHURINE 5.0 02/26/2018 1032   GLUCOSEU NEGATIVE 02/26/2018 1032   HGBUR NEGATIVE 02/26/2018 1032   BILIRUBINUR NEGATIVE 02/26/2018 1032   BILIRUBINUR negative 09/04/2017 1217   BILIRUBINUR Negative 11/29/2007 1119   KETONESUR NEGATIVE 02/26/2018 1032   PROTEINUR NEGATIVE 02/26/2018 1032   UROBILINOGEN 0.2 09/04/2017 1217   UROBILINOGEN 0.2 03/02/2009 0856   NITRITE NEGATIVE 02/26/2018 1032   LEUKOCYTESUR SMALL (A) 02/26/2018 1032   LEUKOCYTESUR Negative 11/29/2007 1119   Sepsis Labs: @LABRCNTIP (procalcitonin:4,lacticidven:4) )No results found for this or any previous visit (from the past 240 hour(s)).   Radiological Exams on Admission: Dg Chest Portable 1 View  Result Date: 04/16/2018 CLINICAL DATA:  48 year old female with shortness of breath. EXAM: PORTABLE CHEST 1 VIEW COMPARISON:  Chest radiograph dated 04/02/2018 FINDINGS: There is cardiomegaly with probable mild vascular congestion. No significant edema. There is no focal consolidation, pleural effusion, or pneumothorax. No acute osseous pathology. IMPRESSION: Cardiomegaly with probable mild vascular congestion. No focal consolidation. Electronically Signed   By: Anner Crete M.D.   On: 04/16/2018 01:22    EKG: Independently reviewed.  Normal sinus rhythm.  LVH.  Nonspecific ST-T changes.  Assessment/Plan Principal Problem:   Acute on chronic systolic CHF (congestive heart failure) (HCC) Active Problems:   Essential hypertension   Anemia   COPD (chronic obstructive pulmonary disease) (H[facility])   Cocaine abuse (H[facility])   Acute CHF (congestive heart failure) ([place])    1. Acute on chronic systolic heart failure last EF measured in November 2019 was 25 to 30%.  Patient was given Lasix 40 mg IV in the ER will continue with Lasix 40 mg IV  every 12 closely follow intake output metabolic panel  troponin and daily weights.  Patient is on lisinopril spironolactone in addition. 2. COPD not actively wheezing.  Will continue inhalers. 3. Elevated troponin with chest pain could be from CHF.  Patient also had taken cocaine.  We will cycle cardiac markers.  Advised to not taking cocaine. 4. Polysubstance abuse including cocaine and alcohol.  We will keep patient on alcohol withdrawal protocol.  Advised to quit diabetes. 5. Hypertension on amlodipine lisinopril and spironolactone. 6. Anemia follow CBC. 7. Mildly elevated LFTs further work-up as outpatient.   DVT prophylaxis: Lovenox. Code Status: Full code. Family Communication: Discussed with patient. Disposition Plan: Home. Consults called: None. Admission status: Observation.   Rise Patience MD [facility] Pager [phone].  If 7PM-7AM, please contact night-coverage [email or web address] Password [email or web address]  04/16/2018, 4:53 AM

## 2018-04-16 NOTE — ED Notes (Signed)
ED TO INPATIENT HANDOFF REPORT  ED Nurse Name and Phone #:  Callie Fielding [phone]  S Name/Age/Gender Sabrina Holland 48 y.o. female Room/Bed: 028C/028C  Code Status   Code Status: Full Code  Home/SNF/Other Home Patient oriented to: self, place, time and situation Is this baseline? Yes   Triage Complete: Triage complete  Chief Complaint CHF, SOB   Triage Note Per ems pt was at home and was suppose to get her lasix and they were mailed to her. She is usually very compliant with meds. Started having SOB. With chest pain since yesterday. Alert oriented x 4 tachy in the 120's stats were dropping into the 70's when ems arrived. Placed on O2.    Allergies Allergies  Allergen Reactions  . Penicillins Other (See Comments)    Pt had slight throat swelling Has patient had a PCN reaction causing immediate rash, facial/tongue/throat swelling, SOB or lightheadedness with hypotension: yes Has patient had a PCN reaction causing severe rash involving mucus membranes or skin necrosis: No Has patient had a PCN reaction that required hospitalization No Has patient had a PCN reaction occurring within the last 10 years: No If all of the above answers are "NO", then may proceed with Cephalosporin use.     Level of Care/Admitting Diagnosis ED Disposition    ED Disposition Condition Comment   Admit  Hospital Area: [facility] [[id number]]  Level of Care: Telemetry Cardiac [103]  I expect the patient will be discharged within 24 hours: No (not a candidate for 5C-Observation unit)  Diagnosis: Acute CHF (congestive heart failure) [facility]) [[id number]]  Admitting Physician: Rise Patience [phone]  Attending Physician: Rise Patience [email or web address]  PT Class (Do Not Modify): Observation [104]  PT Acc Code (Do Not Modify): Observation [10022]       B Medical/Surgery History Past Medical History:  Diagnosis Date  . Breast cancer ([place])   . Breast cancer, stage 2 ([place])  02/23/2011  . COPD (chronic obstructive pulmonary disease) ([place])   . Hypertension   . TIA (transient ischemic attack) 11/25/2011   "first time" (11/25/2011)   Past Surgical History:  Procedure Laterality Date  . BREAST BIOPSY  2007   left  . MASTECTOMY  09/2005   left  . TONSILLECTOMY AND ADENOIDECTOMY     "when I was little" (11/25/2011)  . TUBAL LIGATION  1993     A IV Location/Drains/Wounds Patient Lines/Drains/Airways Status   Active Line/Drains/Airways    None          Intake/Output Last 24 hours No intake or output data in the 24 hours ending 04/16/18 2542  Labs/Imaging Results for orders placed or performed during the hospital encounter of 04/16/18 (from the past 48 hour(s))  CBC     Status: Abnormal   Collection Time: 04/16/18 12:50 AM  Result Value Ref Range   WBC 5.8 4.0 - 10.5 K/uL   RBC 4.85 3.87 - 5.11 MIL/uL   Hemoglobin 11.3 (L) 12.0 - 15.0 g/dL   HCT 35.7 (L) 36.0 - 46.0 %   MCV 73.6 (L) 80.0 - 100.0 fL   MCH 23.3 (L) 26.0 - 34.0 pg   MCHC 31.7 30.0 - 36.0 g/dL   RDW 19.2 (H) 11.5 - 15.5 %   Platelets 415 (H) 150 - 400 K/uL   nRBC 0.0 0.0 - 0.2 %    Comment: Performed at [facility], [address], [place], [place] [address]  Troponin I - ONCE - STAT  Status: Abnormal   Collection Time: 04/16/18 12:50 AM  Result Value Ref Range   Troponin I 0.05 (HH) <0.03 ng/mL    Comment: CRITICAL RESULT CALLED TO, READ BACK BY AND VERIFIED WITH: Melissaann Dizdarevic A,RN 04/16/18 0155 WAYK Performed at [facility], [place] [address], [place], [place] [address]   Brain natriuretic peptide     Status: Abnormal   Collection Time: 04/16/18 12:50 AM  Result Value Ref Range   B Natriuretic Peptide 1,857.5 (H) 0.0 - 100.0 pg/mL    Comment: Performed at [facility] [address] [place], [place] [address]  Comprehensive metabolic panel     Status: Abnormal   Collection Time: 04/16/18 12:50 AM  Result Value Ref Range   Sodium 134 (L) 135 - 145 mmol/L    Potassium 3.8 3.5 - 5.1 mmol/L   Chloride 99 98 - 111 mmol/L   CO2 21 (L) 22 - 32 mmol/L   Glucose, Bld 126 (H) 70 - 99 mg/dL   BUN 8 6 - 20 mg/dL   Creatinine, Ser 0.83 0.44 - 1.00 mg/dL   Calcium 8.4 (L) 8.9 - 10.3 mg/dL   Total Protein 6.0 (L) 6.5 - 8.1 g/dL   Albumin 3.2 (L) 3.5 - 5.0 g/dL   AST 49 (H) 15 - 41 U/L   ALT 18 0 - 44 U/L   Alkaline Phosphatase 91 38 - 126 U/L   Total Bilirubin 2.1 (H) 0.3 - 1.2 mg/dL   GFR calc non Af Amer >60 >60 mL/min   GFR calc Af Amer >60 >60 mL/min   Anion gap 14 5 - 15    Comment: Performed at [facility], [place] [address], [place], [place] [address]  Magnesium     Status: None   Collection Time: 04/16/18 12:50 AM  Result Value Ref Range   Magnesium 1.7 1.7 - 2.4 mg/dL    Comment: Performed at [facility], [place] [address], [place], [place] [address]  I-STAT 7, (LYTES, BLD GAS, ICA, H+H)     Status: Abnormal   Collection Time: 04/16/18  1:49 AM  Result Value Ref Range   pH, Arterial 7.433 7.350 - 7.450   pCO2 arterial 40.6 32.0 - 48.0 mmHg   pO2, Arterial 90.0 83.0 - 108.0 mmHg   Bicarbonate 27.2 20.0 - 28.0 mmol/L   TCO2 28 22 - 32 mmol/L   O2 Saturation 97.0 %   Acid-Base Excess 3.0 (H) 0.0 - 2.0 mmol/L   Sodium 135 135 - 145 mmol/L   Potassium 3.1 (L) 3.5 - 5.1 mmol/L   Calcium, Ion 1.15 1.15 - 1.40 mmol/L   HCT 40.0 36.0 - 46.0 %   Hemoglobin 13.6 12.0 - 15.0 g/dL   Patient temperature 98.1 F    Collection site RADIAL, ALLEN'S TEST ACCEPTABLE    Drawn by RT    Sample type ARTERIAL    Dg Chest Portable 1 View  Result Date: 04/16/2018 CLINICAL DATA:  48 year old female with shortness of breath. EXAM: PORTABLE CHEST 1 VIEW COMPARISON:  Chest radiograph dated 04/02/2018 FINDINGS: There is cardiomegaly with probable mild vascular congestion. No significant edema. There is no focal consolidation, pleural effusion, or pneumothorax. No acute osseous pathology. IMPRESSION: Cardiomegaly with probable mild vascular congestion. No  focal consolidation. Electronically Signed   By: Anner Crete M.D.   On: 04/16/2018 01:22    Pending Labs Unresulted Labs (From admission, onward)    Start     Ordered   04/23/18  0500  Creatinine, serum  (enoxaparin (LOVENOX)    CrCl >/= 30 ml/min)  Weekly,   R    Comments:  while on enoxaparin therapy    04/16/18 0452   04/16/18 0453  Troponin I - Now Then Q6H  Now then every 6 hours,   R     04/16/18 0452          Vitals/Pain Today's Vitals   04/16/18 0125 04/16/18 0215 04/16/18 0300 04/16/18 0345  BP:  (!) 126/108 (!) 136/94 (!) 134/104  Pulse:  (!) 121 (!) 115 (!) 110  Resp:  (!) 32 (!) 31 (!) 26  Temp:      TempSrc:      SpO2:  99% 97% 98%  Weight: 56.7 kg     Height:      PainSc:        Isolation Precautions No active isolations  Medications Medications  aspirin EC tablet 81 mg (has no administration in time range)  amLODipine (NORVASC) tablet 5 mg (has no administration in time range)  atorvastatin (LIPITOR) tablet 40 mg (has no administration in time range)  lisinopril (PRINIVIL,ZESTRIL) tablet 20 mg (has no administration in time range)  spironolactone (ALDACTONE) tablet 25 mg (has no administration in time range)  pantoprazole (PROTONIX) EC tablet 40 mg (has no administration in time range)  potassium chloride SA (K-DUR,KLOR-CON) CR tablet 20 mEq (has no administration in time range)  albuterol (PROVENTIL HFA;VENTOLIN HFA) 108 (90 Base) MCG/ACT inhaler 1-2 puff (has no administration in time range)  mometasone-formoterol (DULERA) 100-5 MCG/ACT inhaler 2 puff (has no administration in time range)  tiotropium (SPIRIVA) inhalation capsule ([facility] use ONLY) 18 mcg (has no administration in time range)  acetaminophen (TYLENOL) tablet 650 mg (has no administration in time range)    Or  acetaminophen (TYLENOL) suppository 650 mg (has no administration in time range)  ondansetron (ZOFRAN) tablet 4 mg (has no administration in time range)    Or  ondansetron  (ZOFRAN) injection 4 mg (has no administration in time range)  enoxaparin (LOVENOX) injection 40 mg (has no administration in time range)  furosemide (LASIX) injection 40 mg (has no administration in time range)  furosemide (LASIX) injection 40 mg (40 mg Intravenous Given 04/16/18 0122)  acetaminophen (TYLENOL) tablet 1,000 mg (1,000 mg Oral Given 04/16/18 0201)  fentaNYL (SUBLIMAZE) injection 50 mcg (50 mcg Intravenous Given 04/16/18 0252)  aspirin chewable tablet 324 mg (324 mg Oral Given 04/16/18 0251)    Mobility walks Low fall risk   Focused Assessments Cardiac Assessment Handoff:  Cardiac Rhythm: Sinus tachycardia Lab Results  Component Value Date   TROPONINI 0.05 (HH) 04/16/2018   Lab Results  Component Value Date   DDIMER 2.47 (H) 06/20/2017   Does the Patient currently have chest pain? Yes     R Recommendations: See Admitting Provider Note  Report given to:   Additional Notes:  Alert oriented x 4. Left arm restricted from mastectomy.

## 2018-04-16 NOTE — Progress Notes (Signed)
Admitted after midnight.  See full H&P for assessment and plan details.  Patient reports that all of this started after she called in a prescription for her Lasix and it was mailed out.  She is been without Lasix since 3/28 and reports last using cocaine on 3/27.  At this time she reports continued chest pain 10 out of 10, and has been coughing up thick green sputum.  Physical exam: Neck: Positive JVD.  Neck otherwise supple. Respirations-mildly tachypneic with positive crackles heard in both lung fields.  Able to talk in complete sentences. Cardiac-tachycardic with no significant rub or gallop.  Trace lower extremity edema noted.   Assessment and plan(see full H&P for complete details): Systolic congestive heart failure exacerbation: Acute on chronic.  BNP elevated at 1857.5 and chest x-ray showing cardiomegaly with mild vascular congestion. -Continue with Lasix 40 mg twice daily and will reassess in a.m.  Elevated troponin, chest pain: Troponins 0.05-> 0.05.  Suspect secondary to demand related with congestive heart failure exacerbation and recently taking cocaine.   -Morphine IV as needed chest pain  Microcytic anemia: Hemoglobin 11.3 with low MCV and MCH.  Previous iron levels from 06/2017 noted to be low normal. -Check iron studies in a.m.

## 2018-04-16 NOTE — ED Notes (Signed)
Heart healthy breakfast tray ordered 

## 2018-04-16 NOTE — ED Triage Notes (Signed)
Per ems pt was at home and was suppose to get her lasix and they were mailed to her. She is usually very compliant with meds. Started having SOB. With chest pain since yesterday. Alert oriented x 4 tachy in the 120's stats were dropping into the 70's when ems arrived. Placed on O2.

## 2018-04-16 NOTE — ED Provider Notes (Addendum)
[facility] EMERGENCY DEPARTMENT Provider Note   CSN: [id number] Arrival date & time: 04/16/18  0050    History   Chief Complaint Chief Complaint  Patient presents with   Shortness of Breath   Chest Pain    HPI Sabrina Holland is a 48 y.o. 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  She reports that she has been out of her home Lasix for the last 2 days.  She also reports that she has not taken her home aspirin for the last 2 days, but has been compliant with all of her other home medications.  She denies of fever, chills, productive cough, nausea, vomiting, diarrhea, abdominal pain, dizziness, lightheadedness, diaphoresis, headache, visual changes, or palpitations.  She reports that she used her home albuterol inhaler prior to arrival with no improvement in her symptoms.  EMS reports the patient was satting in the 70s on room air upon arrival.  She does not have home O2.  The patient endorses a history of cocaine and alcohol use.  She reports that she last used cocaine 2 days ago.  She reports that alcohol use depends on her work schedule.  She reports that she consumed approximately 40 ounces of beer 2 days ago.  No history of seizures, complicated withdrawal, or DTs.  She denies other IV or recreational drug use.     The history is provided by the patient. No language interpreter was used.    Past Medical History:  Diagnosis Date   Breast cancer ([place])    Breast cancer, stage 2 ([place]) 02/23/2011   COPD (chronic obstructive pulmonary  disease) ([place])    Hypertension    TIA (transient ischemic attack) 11/25/2011   "first time" (11/25/2011)    Patient Active Problem List   Diagnosis Date Noted   Respiratory distress 04/18/2018   Elevated LFTs 04/18/2018   Acute CHF (congestive heart failure) ([place]) 04/16/2018   CHF exacerbation ([place]) 04/16/2018   Malnutrition of moderate degree 02/27/2018   Dyspnea 02/26/2018   Acute on chronic systolic CHF (congestive heart failure) ([place]) 01/20/2018   Acute on chronic systolic and diastolic heart failure, NYHA class 3 ([place]) 01/20/2018   Cocaine abuse ([place]) 12/19/2017   Mixed hyperlipidemia 12/19/2017   COPD exacerbation ([place])    Acute on chronic respiratory failure with hypoxemia ([place]) 12/03/2017   Cardiomyopathy ([place]) 07/04/2017   COPD (chronic obstructive pulmonary disease) ([place])    Chronic combined systolic and diastolic heart failure ([place])    Anemia 06/20/2017   Polysubstance abuse ([place]) 11/26/2011   TIA (transient ischemic attack) 11/25/2011   Essential hypertension 11/25/2011   Sinusitis 11/25/2011   Vitamin B 12 deficiency 07/18/2011   History of breast cancer 02/23/2011    Past Surgical History:  Procedure Laterality Date   BREAST BIOPSY  2007   left   MASTECTOMY  09/2005   left   TONSILLECTOMY AND ADENOIDECTOMY     "when I was little" (11/25/2011)   TUBAL LIGATION  1993     OB History    Gravida  5   Para  4   Term  4   Preterm  0  AB  1   Living        SAB  1   TAB  0   Ectopic  0   Multiple      Live Births               Home Medications    Prior to Admission medications   Medication Sig Start Date End Date Taking? Authorizing Provider  albuterol (PROVENTIL HFA) 108 (90 Base) MCG/ACT inhaler Inhale 1-2 puffs into the lungs every 6 (six) hours as needed for up to 1 day for wheezing or shortness of breath (cough). 03/05/18 04/17/18 Yes Gildardo Pounds, NP  albuterol (PROVENTIL) (2.5 MG/3ML) 0.083% nebulizer  solution Take 3 mLs (2.5 mg total) by nebulization every 6 (six) hours as needed for wheezing or shortness of breath. 03/05/18  Yes Gildardo Pounds, NP  amLODipine (NORVASC) 5 MG tablet Take 1 tablet (5 mg total) by mouth daily. 03/05/18 06/03/18 Yes Gildardo Pounds, NP  aspirin EC 81 MG EC tablet Take 1 tablet (81 mg total) by mouth daily. 06/24/17  Yes Reyne Dumas, MD  atorvastatin (LIPITOR) 40 MG tablet Take 1 tablet (40 mg total) by mouth daily at 6 PM. 03/05/18  Yes Gildardo Pounds, NP  budesonide-formoterol (SYMBICORT) 80-4.5 MCG/ACT inhaler Inhale 2 puffs into the lungs 2 (two) times daily. 03/05/18  Yes Gildardo Pounds, NP  cyclobenzaprine (FLEXERIL) 10 MG tablet Take 1 tablet (10 mg total) by mouth 2 (two) times daily as needed for up to 10 doses for muscle spasms. 04/02/18  Yes Curatolo, Adam, DO  fluticasone (FLONASE) 50 MCG/ACT nasal spray Place 2 sprays into both nostrils daily. 12/05/17  Yes Eugenie Filler, MD  lisinopril (PRINIVIL,ZESTRIL) 20 MG tablet Take 1 tablet (20 mg total) by mouth daily. 03/05/18 06/03/18 Yes Gildardo Pounds, NP  Multiple Vitamin (MULTIVITAMIN WITH MINERALS) TABS tablet Take 1 tablet by mouth daily.   Yes [provider]  nicotine (NICODERM CQ - DOSED IN MG/24 HOURS) 14 mg/24hr patch Place 1 patch (14 mg total) onto the skin daily. 03/05/18  Yes Gildardo Pounds, NP  omeprazole (PRILOSEC) 20 MG capsule Take 1 capsule (20 mg total) by mouth daily. 03/05/18 06/03/18 Yes Gildardo Pounds, NP  tiotropium (SPIRIVA HANDIHALER) 18 MCG inhalation capsule Place 1 capsule (18 mcg total) into inhaler and inhale daily. 03/05/18 03/05/19 Yes Gildardo Pounds, NP  diclofenac sodium (VOLTAREN) 1 % GEL Apply 2 g topically 4 (four) times daily. 04/23/18   Gildardo Pounds, NP  ferrous sulfate 325 (65 FE) MG tablet Take 1 tablet (325 mg total) by mouth 2 (two) times daily with a meal. 04/18/18   Norval Morton, MD  furosemide (LASIX) 20 MG tablet Take 3 tablets (60 mg total) by  mouth 2 (two) times daily for 30 days. 04/23/18 05/23/18  Gildardo Pounds, NP  lidocaine (LIDODERM) 5 % Place 1 patch onto the skin daily. Remove & Discard patch within 12 hours or as directed by MD 04/29/18   Darlin Drop P, PA-C  senna-docusate (SENOKOT-S) 8.6-50 MG tablet Take 1 tablet by mouth 2 (two) times daily as needed for mild constipation. 04/18/18   Norval Morton, MD  spironolactone (ALDACTONE) 25 MG tablet Take 1 tablet (25 mg total) by mouth daily. 04/23/18 07/22/18  Gildardo Pounds, NP    Family History Family History  Problem Relation Age of Onset   COPD Father    Sickle cell anemia Cousin  maternal cousin    Social History Social History   Tobacco Use   Smoking status: Current Some Day Smoker    Packs/day: 0.00    Years: 20.00    Pack years: 0.00    Types: Cigarettes   Smokeless tobacco: Never Used   Tobacco comment: 1 or 2 cigarettes a day- approx 1 pack week  Substance Use Topics   Alcohol use: Yes    Alcohol/week: 12.0 standard drinks    Types: 12 Cans of beer per week    Comment: occasionally   Drug use: Yes    Types: Marijuana, Cocaine    Comment: patient states she uses cocaine and marijuana daily     Allergies   Penicillins   Review of Systems Review of Systems  Constitutional: Negative for activity change, chills and fever.  HENT: Negative for congestion.   Eyes: Negative for visual disturbance.  Respiratory: Positive for cough and shortness of breath. Negative for wheezing.   Cardiovascular: Positive for chest pain and leg swelling. Negative for palpitations.  Gastrointestinal: Negative for abdominal pain, constipation, diarrhea, nausea and vomiting.  Genitourinary: Negative for dysuria.  Musculoskeletal: Negative for arthralgias, back pain, gait problem, myalgias, neck pain and neck stiffness.  Skin: Negative for rash.  Allergic/Immunologic: Negative for immunocompromised state.  Neurological: Negative for dizziness, seizures,  weakness, numbness and headaches.  Psychiatric/Behavioral: Negative for confusion.   Physical Exam Updated Vital Signs BP 101/78    Pulse 98    Temp (!) 97.4 F (36.3 C) (Oral)    Resp 14    Ht 5\' 5"  (1.651 m)    Wt 60.4 kg    LMP 02/18/2015 (Exact Date) Comment: MAYBE ONCE A YEAR   SpO2 95%    BMI 22.17 kg/m   Physical Exam Vitals signs and nursing note reviewed.  Constitutional:      General: She is not in acute distress. HENT:     Head: Normocephalic.  Eyes:     Conjunctiva/sclera: Conjunctivae normal.  Neck:     Musculoskeletal: Neck supple.     Vascular: No JVD.  Cardiovascular:     Rate and Rhythm: Regular rhythm. Tachycardia present.     Heart sounds: No murmur. No friction rub. No gallop.   Pulmonary:     Effort: Tachypnea and accessory muscle usage present. No respiratory distress.     Breath sounds: Examination of the right-middle field reveals rhonchi. Examination of the right-lower field reveals rhonchi. Rhonchi present. No wheezing.     Comments: [place] in place. On 3 liters. Conversational dyspnea. Accessory muscle use. No retractions or nasal flaring.  Chest:     Chest wall: No tenderness.  Abdominal:     General: There is no distension.     Palpations: Abdomen is soft.  Skin:    General: Skin is warm.     Findings: No rash.  Neurological:     Mental Status: She is alert.  Psychiatric:        Behavior: Behavior normal.     ED Treatments / Results  Labs (all labs ordered are listed, but only abnormal results are displayed) Labs Reviewed  CBC - Abnormal; Notable for the following components:      Result Value   Hemoglobin 11.3 (*)    HCT 35.7 (*)    MCV 73.6 (*)    MCH 23.3 (*)    RDW 19.2 (*)    Platelets 415 (*)    All other components within normal limits  TROPONIN I -  Abnormal; Notable for the following components:   Troponin I 0.05 (*)    All other components within normal limits  BRAIN NATRIURETIC PEPTIDE - Abnormal; Notable for the following  components:   B Natriuretic Peptide 1,857.5 (*)    All other components within normal limits  COMPREHENSIVE METABOLIC PANEL - Abnormal; Notable for the following components:   Sodium 134 (*)    CO2 21 (*)    Glucose, Bld 126 (*)    Calcium 8.4 (*)    Total Protein 6.0 (*)    Albumin 3.2 (*)    AST 49 (*)    Total Bilirubin 2.1 (*)    All other components within normal limits  TROPONIN I - Abnormal; Notable for the following components:   Troponin I 0.05 (*)    All other components within normal limits  TROPONIN I - Abnormal; Notable for the following components:   Troponin I 0.05 (*)    All other components within normal limits  TROPONIN I - Abnormal; Notable for the following components:   Troponin I 0.05 (*)    All other components within normal limits  IRON AND TIBC - Abnormal; Notable for the following components:   Iron 19 (*)    TIBC 470 (*)    Saturation Ratios 4 (*)    All other components within normal limits  CBC - Abnormal; Notable for the following components:   Hemoglobin 11.5 (*)    MCV 73.2 (*)    MCH 22.8 (*)    RDW 19.0 (*)    Platelets 414 (*)    All other components within normal limits  BASIC METABOLIC PANEL - Abnormal; Notable for the following components:   Sodium 134 (*)    Chloride 95 (*)    Glucose, Bld 127 (*)    Creatinine, Ser 1.06 (*)    All other components within normal limits  MAGNESIUM - Abnormal; Notable for the following components:   Magnesium 1.6 (*)    All other components within normal limits  BASIC METABOLIC PANEL - Abnormal; Notable for the following components:   Sodium 130 (*)    Chloride 95 (*)    Glucose, Bld 133 (*)    Calcium 8.6 (*)    All other components within normal limits  POCT I-STAT 7, (LYTES, BLD GAS, ICA,H+H) - Abnormal; Notable for the following components:   Acid-Base Excess 3.0 (*)    Potassium 3.1 (*)    All other components within normal limits  MAGNESIUM  FERRITIN    EKG EKG  Interpretation  Date/Time:  Monday April 16 2018 00:53:59 EDT Ventricular Rate:  124 PR Interval:    QRS Duration: 104 QT Interval:  319 QTC Calculation: 459 R Axis:   15 Text Interpretation:  Sinus tachycardia Consider right atrial enlargement Left ventricular hypertrophy Nonspecific T abnrm, anterolateral leads Minimal ST elevation, lateral leads Artifact in lead(s) II III aVR aVL aVF V1 No significant change since last tracing Artifact Confirmed by Pryor Curia [phone]) on 04/16/2018 12:58:35 AM Also confirmed by Ward, Cyril Mourning [phone]), editor Philomena Doheny [phone])  on 04/16/2018 7:06:22 AM   Radiology No results found.  Procedures .Critical Care Performed by: Joanne Gavel, PA-C Authorized by: Joanne Gavel, PA-C   Critical care provider statement:    Critical care time (minutes):  40   Critical care time was exclusive of:  Separately billable procedures and treating other patients and teaching time   Critical care was necessary to treat or prevent imminent  or life-threatening deterioration of the following conditions:  Cardiac failure   Critical care was time spent personally by me on the following activities:  Ordering and performing treatments and interventions, ordering and review of laboratory studies, ordering and review of radiographic studies, pulse oximetry, re-evaluation of patient's condition, review of old charts, obtaining history from patient or surrogate, evaluation of patient's response to treatment, development of treatment plan with patient or surrogate and examination of patient   (including critical care time)  Medications Ordered in ED Medications  furosemide (LASIX) injection 40 mg (40 mg Intravenous Given 04/16/18 0122)  acetaminophen (TYLENOL) tablet 1,000 mg (1,000 mg Oral Given 04/16/18 0201)  fentaNYL (SUBLIMAZE) injection 50 mcg (50 mcg Intravenous Given 04/16/18 0252)  aspirin chewable tablet 324 mg (324 mg Oral Given 04/16/18 0251)  potassium chloride  SA (K-DUR,KLOR-CON) CR tablet 40 mEq (40 mEq Oral Given 04/17/18 1126)  furosemide (LASIX) injection 40 mg (0 mg Intravenous Duplicate 5/36/64 4034)     Initial Impression / Assessment and Plan / ED Course  I have reviewed the triage vital signs and the nursing notes.  Pertinent labs & imaging results that were available during my care of the patient were reviewed by me and considered in my medical decision making (see chart for details).        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  No constitutional symptoms.  On arrival, EMS noted the patient was satting in the 70s and improved to the mid 90s on nasal cannula.  On initial exam, patient has increased work of breathing, and is satting in the high 90s.  Patient was seen and independently evaluated by Dr. Leonides Schanz, attending physician.  Differential diagnosis includes ACS, COPD exacerbation, PE, pneumonia, COVID-19, aortic dissection, esophageal rupture, or CHF exacerbation.  EKG with sinus tachycardia, otherwise no acute changes.  This x-ray with cardiomegaly with probable mild vascular congestion, but no focal consolidation.  Labs are notable for AST to ALT 2-1 ratio, likely secondary to chronic alcohol use.  She has no history of complicated withdrawal from alcohol.  Hemoglobin is stable at 11.3.  H on ABG is reassuring at 7.43.  BNP is elevated at 1857.  Initial troponin is elevated at 0.05.  At this time, I do not feel that the patient needs BiPAP.  Given patient's work-up, I suspect the patient is having a CHF exacerbation.  40 mg of IV Lasix was given in the ER.  I suspect from troponin is elevated secondary to demand.  However, I gave the patient aspirin and she was given fentanyl for pain control.   She was also given albuterol.  After initial treatment, she was able to ambulate to the restroom independently without decline in her respiratory status.  However, given her clinical status, she warrants inpatient admission for further diuresis and trending of her troponin and improvement in her respiratory status.  Consulted the hospitalist team and spoke with Dr. Hal Hope who will accept the patient for admission. The patient appears reasonably stabilized for admission considering the current resources, flow, and capabilities available in the ED at this time, and I doubt any other [facility] requiring further screening and/or treatment in the ED prior to admission.   Final Clinical Impressions(s) / ED Diagnoses   Final diagnoses:  Acute on chronic combined systolic (congestive)  and diastolic (congestive) heart failure [facility])    ED Discharge Orders         Ordered    furosemide (LASIX) 20 MG tablet  2 times daily,   Status:  Discontinued     04/18/18 1037    ferrous sulfate 325 (65 FE) MG tablet  2 times daily with meals     04/18/18 1037    senna-docusate (SENOKOT-S) 8.6-50 MG tablet  2 times daily PRN     04/18/18 1037    diclofenac sodium (VOLTAREN) 1 % GEL  4 times daily,   Status:  Discontinued     04/18/18 1037    Increase activity slowly     04/18/18 1037    Ambulatory referral to Cardiology     04/18/18 1037    Discharge instructions    Comments:  Follow with Primary MD Gildardo Pounds, NP in 7-10 days.  Your shortness of breath was likely related with your congestive heart failure and inability to get your prescription for Lasix.  Please take all prescribed medications as recommended.  Please refrain from cocaine use as continued use is weakening your heart.  Furthermore, continued use of cocaine puts you at risk for heart attack, stroke, and even death.    Get CBC and BMP  -  checked  by Primary MD or SNF MD in 5-7 days ( we routinely change or add medications that can affect your  baseline labs and fluid status, therefore we recommend that you get the mentioned basic workup next visit with your PCP, your PCP may decide not to get them or add new tests based on their clinical decision)  Activity: As tolerated with Full fall precautions use walker/cane & assistance as needed  Disposition Home **  Diet: Heart Healthy   restriction: 1200 mL Fluid  Diet effective now              For Heart failure patients - Check your Weight same time everyday, if you gain over 2 pounds, or you develop in leg swelling, experience more shortness of breath or chest pain, call your Primary doctor immediately. Follow Cardiac Low Salt Diet and 1.5 lit/day fluid restriction.  Special Instructions: If you have smoked or chewed Tobacco  in the last 2 yrs please stop smoking, stop any regular Alcohol  and or any Recreational drug use.  On your next visit with your primary care physician please Get Medicines reviewed and adjusted.  Please request your Gildardo Pounds, NP to go over all Hospital Tests and Procedure/Radiological results at the follow up, please get all Hospital records sent to your Prim MD by signing hospital release before you go home.  If you experience worsening of your admission symptoms, develop shortness of breath, life threatening emergency, suicidal or homicidal thoughts you must seek medical attention immediately by calling 911 or calling your MD immediately  if symptoms less severe.  You Must read complete instructions/literature along with all the possible adverse reactions/side effects for all the Medicines you take and that have been prescribed to you. Take any new Medicines after you have completely understood and accpet all the possible adverse reactions/side effects.   Do not drive, operate heavy machinery, perform activities at heights, swimming or participation in water activities or provide baby sitting services if your were admitted for syncope or siezures until  you have seen by Primary MD or a Neurologist and advised to do so again.  Do not drive when taking Pain medications.  Do not take more than prescribed Pain, Sleep and Anxiety Medications  Wear Seat belts while driving.   Please note  You were cared for by a hospitalist during your hospital stay. If you have any questions about your discharge medications or the care you received while you were in the hospital after you are discharged, you can call the unit and asked to speak with the hospitalist on call if the hospitalist that took care of you is not available. Once you are discharged, your primary care physician will handle any further medical issues. Please note that NO REFILLS for any discharge medications will be authorized once you are discharged, as it is imperative that you return to your primary care physician (or establish a relationship with a primary care physician if you do not have one) for your aftercare needs so that they can reassess your need for medications and monitor your lab values.   04/18/18 1037    spironolactone (ALDACTONE) 25 MG tablet  Daily,   Status:  Discontinued     04/18/18 1037           Taneya Conkel A, PA-C 04/16/18 2924    Ward, Delice Bison, DO 04/16/18 4628    Ward, Delice Bison, DO 04/16/18 6381    Daven Pinckney A, PA-C 05/08/18 0408    Ward, Delice Bison, DO 05/08/18 7711

## 2018-04-16 NOTE — ED Notes (Signed)
Paging MD to notify of 10/10 chest pain and a new admission order

## 2018-04-17 ENCOUNTER — Telehealth: Payer: Self-pay

## 2018-04-17 DIAGNOSIS — D500 Iron deficiency anemia secondary to blood loss (chronic): Secondary | ICD-10-CM

## 2018-04-17 DIAGNOSIS — I10 Essential (primary) hypertension: Secondary | ICD-10-CM

## 2018-04-17 LAB — BASIC METABOLIC PANEL
Anion gap: 9 (ref 5–15)
BUN: 13 mg/dL (ref 6–20)
CO2: 30 mmol/L (ref 22–32)
Calcium: 8.9 mg/dL (ref 8.9–10.3)
Chloride: 95 mmol/L — ABNORMAL LOW (ref 98–111)
Creatinine, Ser: 1.06 mg/dL — ABNORMAL HIGH (ref 0.44–1.00)
GFR calc non Af Amer: >60 mL/min (ref >60)
Glucose, Bld: 127 mg/dL — ABNORMAL HIGH (ref 70–99)
Potassium: 3.5 mmol/L (ref 3.5–5.1)
Sodium: 134 mmol/L — ABNORMAL LOW (ref 135–145)

## 2018-04-17 LAB — IRON AND TIBC
Iron: 19 ug/dL — ABNORMAL LOW (ref 28–170)
Saturation Ratios: 4 % — ABNORMAL LOW (ref 10.4–31.8)
TIBC: 470 ug/dL — ABNORMAL HIGH (ref 250–450)
UIBC: 451 ug/dL

## 2018-04-17 LAB — CBC
HCT: 36.9 % (ref 36.0–46.0)
Hemoglobin: 11.5 g/dL — ABNORMAL LOW (ref 12.0–15.0)
MCH: 22.8 pg — ABNORMAL LOW (ref 26.0–34.0)
MCHC: 31.2 g/dL (ref 30.0–36.0)
MCV: 73.2 fL — ABNORMAL LOW (ref 80.0–100.0)
NRBC: 0 % (ref 0.0–0.2)
Platelets: 414 10*3/uL — ABNORMAL HIGH (ref 150–400)
RBC: 5.04 MIL/uL (ref 3.87–5.11)
RDW: 19 % — ABNORMAL HIGH (ref 11.5–15.5)
WBC: 6.4 10*3/uL (ref 4.0–10.5)

## 2018-04-17 LAB — FERRITIN: Ferritin: 23 ng/mL (ref 11–307)

## 2018-04-17 MED ORDER — SENNOSIDES-DOCUSATE SODIUM 8.6-50 MG PO TABS: 1.0000 | ORAL_TABLET | Freq: Two times a day (BID) | ORAL | Status: DC | PRN

## 2018-04-17 MED ORDER — FERROUS SULFATE 325 (65 FE) MG PO TABS
325.0000 mg | ORAL_TABLET | Freq: Two times a day (BID) | ORAL | Status: DC
  Administered 2018-04-17 – 2018-04-18 (×3): 325 mg via ORAL
  Filled 2018-04-17 (×3): qty 1

## 2018-04-17 MED ORDER — GUAIFENESIN ER 600 MG PO TB12
600.0000 mg | ORAL_TABLET | Freq: Two times a day (BID) | ORAL | Status: DC
  Administered 2018-04-17 – 2018-04-18 (×3): 600 mg via ORAL
  Filled 2018-04-17 (×3): qty 1

## 2018-04-17 MED ORDER — DILTIAZEM HCL 25 MG/5ML IV SOLN
5.0000 mg | INTRAVENOUS | Status: DC | PRN
  Filled 2018-04-17: qty 5

## 2018-04-17 MED ORDER — POTASSIUM CHLORIDE CRYS ER 20 MEQ PO TBCR
40.0000 meq | EXTENDED_RELEASE_TABLET | ORAL | Status: AC
  Administered 2018-04-17: 40 meq via ORAL
  Filled 2018-04-17: qty 2

## 2018-04-17 MED ORDER — FUROSEMIDE 10 MG/ML IJ SOLN: 40.0000 mg | Freq: Once | INTRAMUSCULAR | Status: AC

## 2018-04-17 MED ORDER — LEVALBUTEROL HCL 0.63 MG/3ML IN NEBU: 0.6300 mg | INHALATION_SOLUTION | Freq: Four times a day (QID) | RESPIRATORY_TRACT | Status: DC | PRN

## 2018-04-17 MED ORDER — DICLOFENAC SODIUM 1 % TD GEL
2.0000 g | Freq: Four times a day (QID) | TRANSDERMAL | Status: DC
  Administered 2018-04-17 – 2018-04-18 (×5): 2 g via TOPICAL
  Filled 2018-04-17: qty 100

## 2018-04-17 MED ORDER — FUROSEMIDE 40 MG PO TABS
40.0000 mg | ORAL_TABLET | Freq: Two times a day (BID) | ORAL | Status: DC
  Administered 2018-04-18: 08:00:00 40 mg via ORAL
  Filled 2018-04-17: qty 1

## 2018-04-17 NOTE — Progress Notes (Addendum)
Progress Note    Sabrina Holland  ZOX:[id number] DOB: Aug 30, 1970  DOA: 04/16/2018 PCP: Gildardo Pounds, NP    Brief Narrative:   Chief complaint:  shortness of breath  Medical records reviewed and are as summarized below:  Sabrina Holland is an 48 y.o. 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  Assessment/Plan:   Principal Problem:   Acute on chronic systolic CHF (congestive heart failure) (HCC) Active Problems:   Essential hypertension   Anemia   COPD (chronic obstructive pulmonary disease) (HCC)   Cocaine abuse (HCC)   Acute CHF (congestive heart failure) (HCC)   CHF exacerbation ([place])  1.  Acute on chronic systolic heart failure: She reports recently running out of home medications of Lasix.  Last EF noted to be 25 to 30% on 09/2017.  Patient presents with elevated BNP of 1857.5.  Chest x-ray showing cardiomegaly with probable mild vascular congestion, but no focal consolidation.  She was initially started on IV Lasix of 40 mg IV twice daily. -Strict intake and output -Daily weight -Give Lasix IV 40 mg x 1 dose this a.m. and switch back to home regimen 40 mg p.o. twice daily -Continue ACE inhibitor -Avoiding beta-blockers given history of cocaine use -Patient missed last appointment with cardiology, but will try and set up with a repeat outpatient follow-up visit  2.  SVT: Patient noted to have several episodes of SVT with heart rates into the 180s sustained for up to 1 minute.  Discussed with patient's cardiologist who recommended against using any beta-blockers.  Recommend to consider diltiazem if needed. -IV Cardizem as needed   3.  COPD, without acute exacerbation: Patient not acutely wheezing on physical exam. -Continue pharmacy substitution for home inhalers including Dulera -Change albuterol nebs to levalbuterol nebs  4.  Chest  pain, elevated troponin: Patient reports recent injury to chest wall.  Coughing worsens chest discomfort.  Troponin mildly elevated at 0.054.  Suspect chest pain is more likely musculoskeletal in nature and elevated troponin secondary to demand. -Voltaren gel as needed chest discomfort  5.  Essential hypertension -Continue amlodipine, furosemide, and lisinopril  6.  Microcytic anemia: Chronic: Hemoglobin 11.2-today with low MCV and MCH.  Iron studies reveal iron 19, TIBC 470, ferritin 23. -Recommended starting ferrous sulfate 325 mg with meals twice daily -Would likely benefit from outpatient colonoscopy  7.  Polysubstance abuse: Patient reports continued intermittent use of cocaine. -Counseled on the need of cessation of tobacco and illicit drug use  8.  GERD -Continue pharmacy substitution of Protonix Body mass index is 21.6 kg/m.   Family Communication/Anticipated D/C date and plan/Code Status   DVT prophylaxis: Lovenox ordered. Code Status: Full Code.  Family Communication:   Disposition Plan: Likely discharge home in a.m.   Medical Consultants:    Discussed with patient's cardiologist Dr. Bethann Berkshire    Anti-Infectives:    None  Subjective:   Patient reports recent miscarriage of 1 of her family members which she notes led her to use cocaine.  Complains of a cough that is productive with chest discomfort from previous fall.  Objective:    Vitals:   04/16/18 2013 04/17/18 0650 04/17/18 0736 04/17/18 0813  BP: (!) 130/96 (!) 121/102  109/86  Pulse: (!) 134 (!) 120    Resp:      Temp: 97.8 F (36.6 C) 97.7 F (36.5 C)  TempSrc: Oral Oral    SpO2: 90% 100% 95%   Weight:  58.9 kg    Height:        Intake/Output Summary (Last 24 hours) at 04/17/2018 1056 Last data filed at 04/17/2018 0700 Gross per 24 hour  Intake 60 ml  Output 1250 ml  Net -1190 ml   Filed Weights   04/16/18 0125 04/16/18 0947 04/17/18 0650  Weight: 56.7 kg 59.1 kg 58.9 kg    Exam:  Constitutional: Thin female who appears older than stated age and NAD, calm, comfortable Eyes: PERRL, lids and conjunctivae normal ENMT: Mucous membranes are moist. Posterior pharynx clear of any exudate or lesions.  Neck: normal, supple, no masses, no thyromegaly Respiratory: Decreased overall aeration but no significant wheezes or rhonchi appreciated.  Patient on 2 L nasal cannula oxygen. Cardiovascular: Tachycardic, no murmurs / rubs / gallops. No extremity edema. 2+ pedal pulses. No carotid bruits.  Abdomen: no tenderness, no masses palpated. No hepatosplenomegaly. Bowel sounds positive.   Musculoskeletal: no clubbing / cyanosis. No joint deformity upper and lower extremities. Good ROM, no contractures. Normal muscle tone.  Skin: no rashes, lesions, ulcers. No induration Neurologic: CN 2-12 grossly intact. Sensation intact, DTR normal. Strength 5/5 in all 4.  Psychiatric: Fair judgment and insight. Alert and oriented x 3. Normal mood.    Data Reviewed:   I have personally reviewed following labs and imaging studies:  Labs: Labs show the following:   Basic Metabolic Panel: Recent Labs  Lab 04/16/18 0050 04/16/18 0149 04/17/18 0342  NA 134* 135 134*  K 3.8 3.1* 3.5  CL 99  --  95*  CO2 21*  --  30  GLUCOSE 126*  --  127*  BUN 8  --  13  CREATININE 0.83  --  1.06*  CALCIUM 8.4*  --  8.9  MG 1.7  --   --    GFR Estimated Creatinine Clearance: 57.8 mL/min (A) (by C-G formula based on SCr of 1.06 mg/dL (H)). Liver Function Tests: Recent Labs  Lab 04/16/18 0050  AST 49*  ALT 18  ALKPHOS 91  BILITOT 2.1*  PROT 6.0*  ALBUMIN 3.2*   No results for input(s): LIPASE, AMYLASE in the last 168 hours. No results for input(s): AMMONIA in the last 168 hours. Coagulation profile No results for input(s): INR, PROTIME in the last 168 hours.  CBC: Recent Labs  Lab 04/16/18 0050 04/16/18 0149 04/17/18 0342  WBC 5.8  --  6.4  HGB 11.3* 13.6 11.5*  HCT 35.7* 40.0 36.9  MCV  73.6*  --  73.2*  PLT 415*  --  414*   Cardiac Enzymes: Recent Labs  Lab 04/16/18 0050 04/16/18 0433 04/16/18 1027 04/16/18 1643  TROPONINI 0.05* 0.05* 0.05* 0.05*   BNP (last 3 results) No results for input(s): PROBNP in the last 8760 hours. CBG: No results for input(s): GLUCAP in the last 168 hours. D-Dimer: No results for input(s): DDIMER in the last 72 hours. Hgb A1c: No results for input(s): HGBA1C in the last 72 hours. Lipid Profile: No results for input(s): CHOL, HDL, LDLCALC, TRIG, CHOLHDL, LDLDIRECT in the last 72 hours. Thyroid function studies: No results for input(s): TSH, T4TOTAL, T3FREE, THYROIDAB in the last 72 hours.  Invalid input(s): FREET3 Anemia work up: Recent Labs    04/17/18 0342  FERRITIN 23  TIBC 470*  IRON 19*   Sepsis Labs: Recent Labs  Lab 04/16/18 0050 04/17/18 0342  WBC 5.8 6.4    Microbiology No results  found for this or any previous visit (from the past 240 hour(s)).  Procedures and diagnostic studies:  Dg Chest Portable 1 View  Result Date: 04/16/2018 CLINICAL DATA:  48 year old female with shortness of breath. EXAM: PORTABLE CHEST 1 VIEW COMPARISON:  Chest radiograph dated 04/02/2018 FINDINGS: There is cardiomegaly with probable mild vascular congestion. No significant edema. There is no focal consolidation, pleural effusion, or pneumothorax. No acute osseous pathology. IMPRESSION: Cardiomegaly with probable mild vascular congestion. No focal consolidation. Electronically Signed   By: Anner Crete M.D.   On: 04/16/2018 01:22    Medications:   . amLODipine  5 mg Oral Daily  . aspirin EC  81 mg Oral Daily  . atorvastatin  40 mg Oral q1800  . diclofenac sodium  2 g Topical QID  . enoxaparin (LOVENOX) injection  40 mg Subcutaneous Daily  . ferrous sulfate  325 mg Oral BID WC  . folic acid  1 mg Oral Daily  . guaiFENesin  600 mg Oral BID  . lisinopril  20 mg Oral Daily  . mometasone-formoterol  2 puff Inhalation BID  .  multivitamin with minerals  1 tablet Oral Daily  . pantoprazole  40 mg Oral Daily  . potassium chloride  40 mEq Oral STAT  . spironolactone  25 mg Oral Daily  . thiamine  100 mg Oral Daily   Or  . thiamine  100 mg Intravenous Daily  . umeclidinium bromide  1 puff Inhalation Daily   Continuous Infusions:   LOS: 0 days   Joshva Labreck A Stephaniemarie Stoffel  [facility]   *Please refer to [email or web address], password [email or web address] to get updated schedule on who will round on this patient, as hospitalists switch teams weekly. If 7PM-7AM, please contact night-coverage at [email or web address], password [email or web address] for any overnight needs.

## 2018-04-17 NOTE — Telephone Encounter (Signed)
Call received from Chriss Driver - Jacquelin Hawking CM requesting a hospital follow up appointment for the patient.  Informed her that an appointment has been scheduled for 4/6/202 @ 1050.  Also informed her that the patient came to the clinic 04/11/2018 to pick up some lasix from [facility] pharmacy.

## 2018-04-17 NOTE — TOC Initial Note (Signed)
Transition of Care [facility]) - Initial/Assessment Note    Patient Details  Name: Sabrina Holland MRN: [id number] Date of Birth: 08-Feb-1970  [facility] CM/SW Contact:    Bethena Roys, RN Phone Number: 04/17/2018, 2:10 PM  Clinical Narrative: CM completed readmission high risk screening. Pt presented for Acute on Chronic CHF- Per patient she was unable to get her medication lasix from the [facility]. CM did reach out to [place] with [facility]- and verified that patient was given enough lasix to make it until her mail order arrived. Patient is stating that she did not have enough medications and ended up here. Pt is without Insurance at this time-FC consulted. Pt works 15-20 hours week at [employer]. Lives with a friend and does not have stable housing. CSW inpatient and outpatient assisting patient via the Housing Authority-(application filed) for stable housing in the future. Patient admitting to using drugs due to her situation and states  this is a coping mechanism for her-Resources will be provided. Hospital follow up appointment established. Opal Sidles stated that [employer] Application has been initiated for patient. MD to send all new Rxs to [place]. No further needs from CM at this time.               Expected Discharge Plan: Home/Self Care Barriers to Discharge: Continued Medical Work up   Patient Goals and CMS Choice Patient states their goals for this hospitalization and ongoing recovery are:: "to quit using and be a better grandmother to her grandchildren.  CMS Medicare.gov Compare Post Acute Care list provided to:: (N/A) Choice offered to / list presented to : NA  Expected Discharge Plan and Services Expected Discharge Plan: Home/Self Care In-house Referral: Clinical Social Work, Development worker, community, PCP / Curator Services: CM Consult, Follow-up appt scheduled, Medication Assistance, Other - See comment(Will have MD  send all Rxs to [place] to make sure pt has medications once she transitions home. ) Post Acute Care Choice: NA Living arrangements for the past 2 months: Apartment(Lives with a female friend. )                 DME Arranged: N/A DME Agency: NA HH Arranged: NA [place] Agency: NA  Prior Living Arrangements/Services Living arrangements for the past 2 months: Apartment(Lives with a female friend. ) Lives with:: Friends Patient language and need for interpreter reviewed:: Yes Do you feel safe going back to the place where you live?: Yes(However, friend is constantly telling her to get out. )      Need for Family Participation in Patient Care: No (Comment)   Current home services: (N/A) Criminal Activity/Legal Involvement Pertinent to Current Situation/Hospitalization: No - Comment as needed  Activities of Daily Living Home Assistive Devices/Equipment: None ADL Screening (condition at time of admission) Patient's cognitive ability adequate to safely complete daily activities?: Yes Is the patient deaf or have difficulty hearing?: No Does the patient have difficulty seeing, even when wearing glasses/contacts?: No Does the patient have difficulty concentrating, remembering, or making decisions?: No Patient able to express need for assistance with ADLs?: Yes Does the patient have difficulty dressing or bathing?: No Independently performs ADLs?: Yes (appropriate for developmental age) Does the patient have difficulty walking or climbing stairs?: No Weakness of Legs: None Weakness of Arms/Hands: None  Permission Sought/Granted                  Emotional Assessment Appearance:: Appears stated age Attitude/Demeanor/Rapport: Engaged Affect (typically  observed): Accepting Orientation: : Oriented to Self, Oriented to Place, Oriented to Situation, Oriented to  Time Alcohol / Substance Use: Illicit Drugs(CSW to provide resources. ) Psych Involvement: No (comment)  Admission diagnosis:   Acute on chronic combined systolic (congestive) and diastolic (congestive) heart failure (HCC) [I50.43] CHF exacerbation ([place]) [I50.9] Patient Active Problem List   Diagnosis Date Noted  . Acute CHF (congestive heart failure) ([place]) 04/16/2018  . CHF exacerbation ([place]) 04/16/2018  . Malnutrition of moderate degree 02/27/2018  . Dyspnea 02/26/2018  . Acute on chronic systolic CHF (congestive heart failure) ([place]) 01/20/2018  . Acute on chronic systolic and diastolic heart failure, NYHA class 3 ([place]) 01/20/2018  . Cocaine abuse ([place]) 12/19/2017  . Mixed hyperlipidemia 12/19/2017  . COPD exacerbation ([place])   . Acute on chronic respiratory failure with hypoxemia ([place]) 12/03/2017  . Cardiomyopathy ([place]) 07/04/2017  . COPD (chronic obstructive pulmonary disease) ([place])   . Chronic combined systolic and diastolic heart failure ([place])   . Anemia 06/20/2017  . Polysubstance abuse ([place]) 11/26/2011  . TIA (transient ischemic attack) 11/25/2011  . Essential hypertension 11/25/2011  . Sinusitis 11/25/2011  . Vitamin B 12 deficiency 07/18/2011  . History of breast cancer 02/23/2011   PCP:  Gildardo Pounds, NP Pharmacy:   [place], [place] [address] [place] [address] Phone: [phone] Fax: [phone]     Social Determinants of Health (SDOH) Interventions  Application for housing authority on file. CSW with [facility] and [facility] will try to see how far the application has advanced. Inpatient CSW will provide resources as well.   Readmission Risk Interventions No flowsheet data found.

## 2018-04-18 ENCOUNTER — Telehealth: Payer: Self-pay

## 2018-04-18 DIAGNOSIS — F1910 Other psychoactive substance abuse, uncomplicated: Secondary | ICD-10-CM

## 2018-04-18 DIAGNOSIS — R7989 Other specified abnormal findings of blood chemistry: Secondary | ICD-10-CM | POA: Diagnosis present

## 2018-04-18 DIAGNOSIS — R945 Abnormal results of liver function studies: Secondary | ICD-10-CM

## 2018-04-18 DIAGNOSIS — E876 Hypokalemia: Secondary | ICD-10-CM

## 2018-04-18 DIAGNOSIS — I5043 Acute on chronic combined systolic (congestive) and diastolic (congestive) heart failure: Secondary | ICD-10-CM

## 2018-04-18 DIAGNOSIS — R0603 Acute respiratory distress: Secondary | ICD-10-CM

## 2018-04-18 LAB — BASIC METABOLIC PANEL
Anion gap: 10 (ref 5–15)
BUN: 13 mg/dL (ref 6–20)
CO2: 25 mmol/L (ref 22–32)
Calcium: 8.6 mg/dL — ABNORMAL LOW (ref 8.9–10.3)
Chloride: 95 mmol/L — ABNORMAL LOW (ref 98–111)
Creatinine, Ser: 0.94 mg/dL (ref 0.44–1.00)
GFR calc Af Amer: >60 mL/min (ref >60)
Glucose, Bld: 133 mg/dL — ABNORMAL HIGH (ref 70–99)
Potassium: 4.1 mmol/L (ref 3.5–5.1)
SODIUM: 130 mmol/L — AB (ref 135–145)

## 2018-04-18 LAB — MAGNESIUM: Magnesium: 1.6 mg/dL — ABNORMAL LOW (ref 1.7–2.4)

## 2018-04-18 MED ORDER — FERROUS SULFATE 325 (65 FE) MG PO TABS: 325.0000 mg | ORAL_TABLET | Freq: Two times a day (BID) | ORAL | 0 refills | Status: DC

## 2018-04-18 MED ORDER — SPIRONOLACTONE 25 MG PO TABS: 25.0000 mg | ORAL_TABLET | Freq: Every day | ORAL | 0 refills | Status: DC

## 2018-04-18 MED ORDER — FUROSEMIDE 20 MG PO TABS: 40.0000 mg | ORAL_TABLET | Freq: Two times a day (BID) | ORAL | 2 refills | Status: DC

## 2018-04-18 MED ORDER — SENNOSIDES-DOCUSATE SODIUM 8.6-50 MG PO TABS: 1.0000 | ORAL_TABLET | Freq: Two times a day (BID) | ORAL | 0 refills | Status: DC | PRN

## 2018-04-18 MED ORDER — DICLOFENAC SODIUM 1 % TD GEL: 2.0000 g | Freq: Four times a day (QID) | TRANSDERMAL | Status: DC

## 2018-04-18 MED FILL — ?FUROSEMIDE 20 MG TABLET: 20 | 90 days supply | Qty: 360 | Fill #0

## 2018-04-18 MED FILL — FERROUS SULFATE 325 MG TAB: 325 (65 FE) | 30 days supply | Qty: 60 | Fill #0

## 2018-04-18 NOTE — Telephone Encounter (Signed)
As per Jacqlyn Krauss, RN CM, the patient was discharged and left prior to obtaining her medications from [facility].   Attempted to contact the patient to inform her that she can pick up her new medications - ferrous sulfate, furosemide, spironolactone at [facility].  Call placed to # [phone], spoke to [facility] and left message with her to have patient return call to this CM # [phone].

## 2018-04-18 NOTE — Telephone Encounter (Signed)
Call returned from patient.  Explained to her that she can pick up her medications at [facility] this afternoon or tomorrow. She said that she does not have the money for the medications ( about $30) and would like to have them mailed to her. She would like them charged to her account and she will pay off when able. She stated that she gets paid every 2 weeks.  She explained that she has enough lasix at home as the last delivery arrived when she was in the hospital.  Her address was confirmed as well as the 2 phone numbers to reach her.  This information was shared with Georgiann Mohs, [place]. 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   Update provided to Jacqlyn Krauss, RN CM

## 2018-04-18 NOTE — Discharge Summary (Signed)
Sabrina Holland, is a 48 y.o. female  DOB August 01, 1970  MRN [id number].  Admission date:  04/16/2018  Admitting Physician  Norval Morton, MD  Discharge Date:  04/18/2018   Primary MD  Gildardo Pounds, NP  Recommendations for primary care physician for things to follow:     Discharge Diagnosis Principal Problem:   Acute on chronic systolic CHF (congestive heart failure) ([place]) Active Problems:   Essential hypertension   Polysubstance abuse ([facility])   Anemia   COPD (chronic obstructive pulmonary disease) ([place])   Cocaine abuse ([place])   Acute CHF (congestive heart failure) ([place])   CHF exacerbation ([place])   Respiratory distress   Elevated LFTs      Past Medical History:  Diagnosis Date   Breast cancer ([place])    Breast cancer, stage 2 ([place]) 02/23/2011   COPD (chronic obstructive pulmonary disease) ([place])    Hypertension    TIA (transient ischemic attack) 11/25/2011   "first time" (11/25/2011)    Past Surgical History:  Procedure Laterality Date   BREAST BIOPSY  2007   left   MASTECTOMY  09/2005   left   TONSILLECTOMY AND ADENOIDECTOMY     "when I was little" (11/25/2011)   TUBAL LIGATION  1993       HPI  from the history and physical done on the day of admission:    Sabrina Holland is a 48 y.o. female with history of chronic systolic heart failure last EF measured in November 2019 was 25 to 30% with COPD polysubstance abuse last taken cocaine 4 days ago presents to the ER because of increasing shortness of breath.  Patient states she is short of breath last 2 to 3 days because she missed her Lasix dose which she lost about to get a refill.  Also has been having chest tightness.  Denies any fever chills or any sick contacts.  Denies any recent travel.  States he has gained at least 6 pounds in the last  2 to 3 days.  ED Course: In the ER patient's BNP is elevated troponin mildly red EKG shows LVH findings.  Chest x-ray shows congestion.  Was given Lasix 40 mg IV admitted for acute CHF.   Hospital Course:    1.    Respiratory distress secondary to systolic heart failure exacerbation: Acute on chronic. She reports recently running out of home medications of Lasix on 3/28 with recent use of cocaine.  Patient was initially noted to require 2 L of nasal cannula oxygen to maintain O2 saturations. Last EF noted to be 25 to 30% on echocardiogram from 09/2017.    Labs revealed elevated BNP of 1857.5.  Chest x-ray showing cardiomegaly with probable mild vascular congestion, but no focal consolidation.  She diuresed with IV Lasix 40 mg twice daily receiving 4 doses.  Thereafter, changed back to her home dose of 40 mg twice daily along with continuation of spironolactone.  Patient was able to be weaned off of nasal cannula oxygen and maintaining O2 saturations  on room air.  She was continued on ACE inhibitor, but beta blockers were avoided given history of cocaine abuse.  Case was discussed with her cardiologist Dr. Timmie Foerster, and he recommended follow-up in outpatient setting.  Discussed with patient need of compliance with medication, and avoidance of illicit drug use.  Daily weights appear to have been unreliable, but patient overall appears to be net negative approximately 1 L since admission.  Amatory referral for patient to follow-up with her cardiologist made.  2.  SVT: Patient noted to have several episodes of SVT with heart rates into the 180s sustained for up to 1 minute.  Discussed with patient's cardiologist who recommended against using any beta-blockers.  Recommend to consider diltiazem if needed.  Patient received a couple doses of  IV Cardizem,.  She was noted to have intermittent episodes of SVT throughout her hospitalization.  3.  COPD, without acute exacerbation: Patient complains of cough, but was  not acutely wheezing on physical exam.  No complaints of fever or sick contacts concerning for COVID-19.  No focal infiltrate noted on x-ray.  Continued pharmacy substitution for home inhalers including Dulera. Change as needed albuterol nebs to levalbuterol nebs to help decrease tachycardia.  4.  Chest pain, elevated troponin: Patient reports recent injury to chest wall.  Coughing worsens chest discomfort.  Troponin mildly elevated at 0.054.  Suspect chest pain is more likely musculoskeletal in nature and elevated troponin secondary to demand. Voltaren gel was given for patient to place on chest with relief of chest discomfort symptoms.   5.  Essential hypertension: Blood pressures were noted to be stable.  Continued amlodipine, furosemide, spironolactone, and lisinopril.  6.  Microcytic anemia: Chronic: Hemoglobin 11.2-today with low MCV and MCH.  Iron studies reveal iron 19, TIBC 470, ferritin 23. Recommended starting ferrous sulfate 325 mg with meals twice daily.  Future preventative health screenings that would be beneficial include outpatient colonoscopy.   7.  Polysubstance abuse: Patient reports continued use of tobacco, alcohol, and intermittent use of cocaine.  CIWA protocols were initiated.  Counseled on the need to refrain from tobacco or illicit drug use.  8.  GERD: During hospitalization patient was continued on PPI.  9.  Elevated liver enzymes: Chronic.  AST: ALT of 45:18 respectively consistent with alcohol abuse.  Body mass index is 21.6 kg/m.   Follow UP  Follow-up Information    Gildardo Pounds, NP Follow up on 04/23/2018.   Specialty:  Nurse Practitioner Why:  @ 1050 am. Please arrive by 1035 am for appointment. If you can not make this scheduled appointment-please call the office and reschedule.  Contact information: [place] [address] [phone]            Consults obtained: No formal consultation made.  Discharge Condition:  Fair  Diet and Activity recommendation: See Discharge Instructions below   Discharge Instructions    Ambulatory referral to Cardiology   Complete by:  As directed    Discharge instructions   Complete by:  As directed    Follow with Primary MD Gildardo Pounds, NP in 7-10 days.  Your shortness of breath was likely related with your congestive heart failure and inability to get your prescription for Lasix.  Please take all prescribed medications as recommended.  Please refrain from cocaine use as continued use is weakening your heart.  Furthermore, continued use of cocaine puts you at risk for heart attack, stroke, and even death.    Get CBC and  BMP  -  checked  by Primary MD or SNF MD in 5-7 days ( we routinely change or add medications that can affect your baseline labs and fluid status, therefore we recommend that you get the mentioned basic workup next visit with your PCP, your PCP may decide not to get them or add new tests based on their clinical decision)  Activity: As tolerated with Full fall precautions use walker/cane & assistance as needed  Disposition Home  Diet: Heart Healthy  Fuid restriction: 1200 mL Fluid  Diet effective now     l         For Heart failure patients - Check your Weight same time everyday, if you gain over 2 pounds, or you develop in leg swelling, experience more shortness of breath or chest pain, call your Primary doctor immediately. Follow Cardiac Low Salt Diet and 1.2 lit/day fluid restriction.  Special Instructions: If you have smoked or chewed Tobacco  in the last 2 yrs please stop smoking, stop any regular Alcohol  and or any Recreational drug use.  On your next visit with your primary care physician please Get Medicines reviewed and adjusted.  Please request your Gildardo Pounds, NP to go over all Hospital Tests and Procedure/Radiological results at the follow up, please get all Hospital records sent to your Prim MD by signing hospital release before  you go home.  If you experience worsening of your admission symptoms, develop shortness of breath, life threatening emergency, suicidal or homicidal thoughts you must seek medical attention immediately by calling 911 or calling your MD immediately  if symptoms less severe.  You Must read complete instructions/literature along with all the possible adverse reactions/side effects for all the Medicines you take and that have been prescribed to you. Take any new Medicines after you have completely understood and accpet all the possible adverse reactions/side effects.   Do not drive, operate heavy machinery, perform activities at heights, swimming or participation in water activities or provide baby sitting services if your were admitted for syncope or siezures until you have seen by Primary MD or a Neurologist and advised to do so again.  Do not drive when taking Pain medications.  Do not take more than prescribed Pain, Sleep and Anxiety Medications  Wear Seat belts while driving.   Please note  You were cared for by a hospitalist during your hospital stay. If you have any questions about your discharge medications or the care you received while you were in the hospital after you are discharged, you can call the unit and asked to speak with the hospitalist on call if the hospitalist that took care of you is not available. Once you are discharged, your primary care physician will handle any further medical issues. Please note that NO REFILLS for any discharge medications will be authorized once you are discharged, as it is imperative that you return to your primary care physician (or establish a relationship with a primary care physician if you do not have one) for your aftercare needs so that they can reassess your need for medications and monitor your lab values.   Increase activity slowly   Complete by:  As directed         Discharge Medications     Allergies as of 04/18/2018      Reactions    Penicillins Other (See Comments)   Pt had slight throat swelling Has patient had a PCN reaction causing immediate rash, facial/tongue/throat swelling, SOB or lightheadedness with  hypotension: yes Has patient had a PCN reaction causing severe rash involving mucus membranes or skin necrosis: No Has patient had a PCN reaction that required hospitalization No Has patient had a PCN reaction occurring within the last 10 years: No If all of the above answers are "NO", then may proceed with Cephalosporin use.      Medication List    STOP taking these medications   famotidine 20 MG tablet Commonly known as:  PEPCID   ibuprofen 800 MG tablet Commonly known as:  ADVIL,MOTRIN   potassium chloride SA 20 MEQ tablet Commonly known as:  K-DUR,KLOR-CON     TAKE these medications   albuterol 108 (90 Base) MCG/ACT inhaler Commonly known as:  Proventil HFA Inhale 1-2 puffs into the lungs every 6 (six) hours as needed for up to 1 day for wheezing or shortness of breath (cough).   albuterol (2.5 MG/3ML) 0.083% nebulizer solution Commonly known as:  PROVENTIL Take 3 mLs (2.5 mg total) by nebulization every 6 (six) hours as needed for wheezing or shortness of breath.   amLODipine 5 MG tablet Commonly known as:  NORVASC Take 1 tablet (5 mg total) by mouth daily.   aspirin 81 MG EC tablet Take 1 tablet (81 mg total) by mouth daily.   atorvastatin 40 MG tablet Commonly known as:  LIPITOR Take 1 tablet (40 mg total) by mouth daily at 6 PM.   budesonide-formoterol 80-4.5 MCG/ACT inhaler Commonly known as:  Symbicort Inhale 2 puffs into the lungs 2 (two) times daily.   cyclobenzaprine 10 MG tablet Commonly known as:  FLEXERIL Take 1 tablet (10 mg total) by mouth 2 (two) times daily as needed for up to 10 doses for muscle spasms.   diclofenac sodium 1 % Gel Commonly known as:  VOLTAREN Apply 2 g topically 4 (four) times daily.   ferrous sulfate 325 (65 FE) MG tablet Take 1 tablet (325 mg  total) by mouth 2 (two) times daily with a meal.   fluticasone 50 MCG/ACT nasal spray Commonly known as:  FLONASE Place 2 sprays into both nostrils daily.   furosemide 20 MG tablet Commonly known as:  LASIX Take 2 tablets (40 mg total) by mouth 2 (two) times daily.   lisinopril 20 MG tablet Commonly known as:  PRINIVIL,ZESTRIL Take 1 tablet (20 mg total) by mouth daily.   multivitamin with minerals Tabs tablet Take 1 tablet by mouth daily.   nicotine 14 mg/24hr patch Commonly known as:  NICODERM CQ - dosed in mg/24 hours Place 1 patch (14 mg total) onto the skin daily.   omeprazole 20 MG capsule Commonly known as:  PRILOSEC Take 1 capsule (20 mg total) by mouth daily.   senna-docusate 8.6-50 MG tablet Commonly known as:  Senokot-S Take 1 tablet by mouth 2 (two) times daily as needed for mild constipation.   spironolactone 25 MG tablet Commonly known as:  ALDACTONE Take 1 tablet (25 mg total) by mouth daily.   tiotropium 18 MCG inhalation capsule Commonly known as:  Spiriva HandiHaler Place 1 capsule (18 mcg total) into inhaler and inhale daily.       Major procedures and Radiology Reports - PLEASE review detailed and final reports for all details, in brief -   Dg Chest 2 View  Result Date: 04/02/2018 CLINICAL DATA:  [place] today causing RIGHT axillary rib pain, history breast cancer post mastectomy, COPD, hypertension, smoker EXAM: CHEST - 2 VIEW COMPARISON:  02/26/2018 FINDINGS: Enlargement of cardiac silhouette with pulmonary vascular congestion.  Mediastinal contours stable with prominent RIGHT paratracheal soft tissues unchanged since 11/07/2017, corresponding to prominent brachiocephalic vein. Linear subsegmental atelectasis at medial RIGHT lung base. Underlying emphysematous changes. No definite acute infiltrate, pleural effusion or pneumothorax. Osseous structures demineralized without definite fracture. IMPRESSION: Enlargement of cardiac silhouette with pulmonary  vascular congestion. Emphysematous changes with subsegmental atelectasis RIGHT lower lobe. No definite acute infiltrate. Electronically Signed   By: Lavonia Dana M.D.   On: 04/02/2018 19:25   Dg Chest Portable 1 View  Result Date: 04/16/2018 CLINICAL DATA:  48 year old female with shortness of breath. EXAM: PORTABLE CHEST 1 VIEW COMPARISON:  Chest radiograph dated 04/02/2018 FINDINGS: There is cardiomegaly with probable mild vascular congestion. No significant edema. There is no focal consolidation, pleural effusion, or pneumothorax. No acute osseous pathology. IMPRESSION: Cardiomegaly with probable mild vascular congestion. No focal consolidation. Electronically Signed   By: Anner Crete M.D.   On: 04/16/2018 01:22    Micro Results  No results found for this or any previous visit (from the past 240 hour(s)).     Today   Subjective    Sabrina Holland today states that her breathing feels much better today and she is ready to go home.  Patient reports that she needs a work note when it is safe to return to work.   Objective   Blood pressure 101/78, pulse 98, temperature (!) 97.4 F (36.3 C), temperature source Oral, resp. rate 14, height 5\' 5"  (1.651 m), weight 60.4 kg, last menstrual period 02/18/2015, SpO2 95 %.   Intake/Output Summary (Last 24 hours) at 04/18/2018 1042 Last data filed at 04/18/2018 0618 Gross per 24 hour  Intake 580 ml  Output 350 ml  Net 230 ml    Exam  Constitutional: Thin elderly appearing female NAD, calm, comfortable Eyes: PERRL, lids and conjunctivae normal ENMT: Mucous membranes are moist. Posterior pharynx clear of any exudate or lesions.  Neck: normal, supple, no masses, no thyromegaly.  No JVD Respiratory: clear to auscultation bilaterally, no wheezing, no crackles. Normal respiratory effort. No accessory muscle use.  Cardiovascular: Mild tachycardia, no murmurs / rubs / gallops. No extremity edema. 2+ pedal pulses. No carotid bruits.  Heart rates  in the low 100s. Abdomen: no tenderness, no masses palpated. No hepatosplenomegaly. Bowel sounds positive.  Musculoskeletal: no clubbing / cyanosis. No joint deformity upper and lower extremities. Good ROM, no contractures. Normal muscle tone.  Skin: no rashes, lesions, ulcers. No induration Neurologic: CN 2-12 grossly intact. Sensation intact, DTR normal. Strength 5/5 in all 4.  Psychiatric: Fair judgment and insight. Alert and oriented x 3. Normal mood.    Data Review   CBC w Diff:  Lab Results  Component Value Date   WBC 6.4 04/17/2018   HGB 11.5 (L) 04/17/2018   HGB 12.5 11/28/2016   HCT 36.9 04/17/2018   HCT 37.9 11/28/2016   PLT 414 (H) 04/17/2018   PLT 347 11/28/2016   LYMPHOPCT 31 02/28/2018   LYMPHOPCT 50.7 (H) 11/28/2016   MONOPCT 10 02/28/2018   MONOPCT 11.3 11/28/2016   EOSPCT 1 02/28/2018   EOSPCT 0.6 11/28/2016   BASOPCT 1 02/28/2018   BASOPCT 0.7 11/28/2016    CMP:  Lab Results  Component Value Date   NA 130 (L) 04/18/2018   NA 141 03/05/2018   NA 140 11/28/2016   K 4.1 04/18/2018   K 4.3 11/28/2016   CL 95 (L) 04/18/2018   CL 106 06/25/2012   CO2 25 04/18/2018   CO2 27 11/28/2016  BUN 13 04/18/2018   BUN 21 03/05/2018   BUN 13.1 11/28/2016   CREATININE 0.94 04/18/2018   CREATININE 1.0 11/28/2016   PROT 6.0 (L) 04/16/2018   PROT 8.1 11/28/2016   ALBUMIN 3.2 (L) 04/16/2018   ALBUMIN 4.0 11/28/2016   BILITOT 2.1 (H) 04/16/2018   BILITOT 0.48 11/28/2016   ALKPHOS 91 04/16/2018   ALKPHOS 74 11/28/2016   AST 49 (H) 04/16/2018   AST 49 (H) 11/28/2016   ALT 18 04/16/2018   ALT 36 11/28/2016  .   Total Time in preparing paper work, data evaluation and todays exam - 35 minutes  Norval Morton M.D on 04/18/2018 at 10:42 AM  [facility]   Office  [phone]

## 2018-04-19 ENCOUNTER — Telehealth: Payer: Self-pay

## 2018-04-19 NOTE — Telephone Encounter (Signed)
Transition Care Management Follow-up Telephone Call Date of discharge and from where:04/18/2018, [facility]  Call placed to patient, # [phone], spoke to her sister , Malachy Mood.  Left message requesting a call back from patient to this CM

## 2018-04-20 ENCOUNTER — Telehealth: Payer: Self-pay

## 2018-04-20 NOTE — Telephone Encounter (Signed)
Transition Care Management Follow-up Telephone Call  Date of discharge and from where: 04/18/2018, [facility]  How have you been since you were released from the hospital? She stated that she is feeling "okay."  She has also returned to work.   Any questions or concerns? No concerns/questions reported  Items Reviewed:  Did the pt receive and understand the discharge instructions provided? She said that she has discharge instructions and has no questions.   Medications obtained and verified? She said that she has all of her medication and has enough medication, including furosemide,  to last until Monday when she comes to the clinic for her appointment.  She is waiting for a delivery of ferrous sulfate, furosemide and spironolactone.  She said that she has no questions. She also said that she is aware of what she is to stop taking: famotidine, ibuprofen and potassium chloride.   Any new allergies since your discharge?   none reported,   Do you have support at home? Yes, daughter and a friend  Other (ie: DME, Home Health, etc) has nebulizer.    Need to confirm if she has obtained a scale  She stated that she understands the 1268ml/day fluid restriction and has a cup to measure her intake  Functional Questionnaire: (I = Independent and D = Dependent) ADL's: independent  Follow up appointments reviewed:    PCP Hospital f/u appt confirmed? 04/23/2018 @ 1050 with PCP  [facility] f/u appt confirmed? She will need to confirm cardiology appointment  Are transportation arrangements needed? She will take the bus.  Currently no charge for [employer] bus  If their condition worsens, is the pt aware to call  their PCP or go to the ED?yes  Was the patient provided with contact information for the PCP's office or ED? She has the clinic phone #.   Was the pt encouraged to call back with questions or concerns? yes

## 2018-04-23 ENCOUNTER — Other Ambulatory Visit: Payer: Self-pay

## 2018-04-23 ENCOUNTER — Ambulatory Visit: Payer: Self-pay | Attending: Nurse Practitioner | Admitting: Nurse Practitioner

## 2018-04-23 ENCOUNTER — Encounter: Payer: Self-pay | Admitting: Nurse Practitioner

## 2018-04-23 VITALS — BP 114/67 | HR 109 | Temp 98.1°F | Ht 65.0 in | Wt 128.2 lb

## 2018-04-23 DIAGNOSIS — K047 Periapical abscess without sinus: Secondary | ICD-10-CM

## 2018-04-23 DIAGNOSIS — I10 Essential (primary) hypertension: Secondary | ICD-10-CM

## 2018-04-23 DIAGNOSIS — R0789 Other chest pain: Secondary | ICD-10-CM

## 2018-04-23 DIAGNOSIS — F17200 Nicotine dependence, unspecified, uncomplicated: Secondary | ICD-10-CM

## 2018-04-23 DIAGNOSIS — F17210 Nicotine dependence, cigarettes, uncomplicated: Secondary | ICD-10-CM

## 2018-04-23 DIAGNOSIS — I5043 Acute on chronic combined systolic (congestive) and diastolic (congestive) heart failure: Secondary | ICD-10-CM

## 2018-04-23 MED ORDER — CLINDAMYCIN HCL 300 MG PO CAPS: 300.0000 mg | ORAL_CAPSULE | Freq: Three times a day (TID) | ORAL | 0 refills | Status: AC

## 2018-04-23 MED ORDER — SPIRONOLACTONE 25 MG PO TABS: 25.0000 mg | ORAL_TABLET | Freq: Every day | ORAL | 0 refills | Status: DC

## 2018-04-23 MED ORDER — FUROSEMIDE 20 MG PO TABS: 60.0000 mg | ORAL_TABLET | Freq: Two times a day (BID) | ORAL | 0 refills | Status: DC

## 2018-04-23 MED ORDER — DICLOFENAC SODIUM 1 % TD GEL: 2.0000 g | Freq: Four times a day (QID) | TRANSDERMAL | 1 refills | Status: DC

## 2018-04-23 MED FILL — SPIRONOLACTONE 25 MG TABLET: 25 | 30 days supply | Qty: 30 | Fill #0

## 2018-04-23 NOTE — Progress Notes (Signed)
Assessment & Plan:  Sabrina Holland was seen today for follow-up.  Diagnoses and all orders for this visit:  Tooth abscess -     clindamycin (CLEOCIN) 300 MG capsule; Take 1 capsule (300 mg total) by mouth 3 (three) times daily for 7 days.  Acute on chronic combined systolic and diastolic CHF (congestive heart failure) (HCC) -     furosemide (LASIX) 20 MG tablet; Take 3 tablets (60 mg total) by mouth 2 (two) times daily for 30 days.  Essential hypertension -     spironolactone (ALDACTONE) 25 MG tablet; Take 1 tablet (25 mg total) by mouth daily. Continue all antihypertensives as prescribed.  Remember to bring in your blood pressure log with you for your follow up appointment.  DASH/Mediterranean Diets are healthier choices for HTN.    Tobacco dependence Sabrina Holland was counseled on the dangers of tobacco use, and was advised to quit. Reviewed strategies to maximize success, including removing cigarettes and smoking materials from environment, stress management and support of family/friends as well as pharmacological alternatives including: Wellbutrin, Chantix, Nicotine patch, Nicotine gum or lozenges. Smoking cessation support: smoking cessation hotline: 1-800-QUIT-NOW.  Smoking cessation classes are also available through [facility]. Call [phone] or visit our website at [email or web address]   A total of 3 minutes was spent on counseling for smoking cessation and Sabrina Holland is not ready to quit.   Other chest pain -     diclofenac sodium (VOLTAREN) 1 % GEL; Apply 2 g topically 4 (four) times daily.    Patient has been counseled on age-appropriate routine health concerns for screening and prevention. These are reviewed and up-to-date. Referrals have been placed accordingly. Immunizations are up-to-date or declined.    Subjective:   Chief Complaint  Patient presents with  . Follow-up    Pt. is here for TCC. Pt. stated both of her foot are swollen.    HPI Sabrina Holland 47 y.o. female presents to office today for hospital follow up.    CHF Patient was admitted to the hospital on 04/16/2018 and discharged on 04/18/2018 with a diagnosis of acute on chronic systolic heart failure.  Most recent EF measured in November 2019 was 25 to 30%.  CHF likely due to chronic polysubstance abuse and patient's last reported cocaine use was 4 days prior to her hospital admission.  Although she tells me today she has not had any illicit substances in over 3 weeks.  She was treated in the hospital with IV Lasix, O2 nasal cannula, and refer to cardiology for close follow-up.  She was instructed to continue amlodipine, furosemide, spironolactone, and lisinopril upon discharge.  Due to patient complaints of chest wall pain secondary to recent injury she was prescribed Voltaren gel.  Also noted for microcytic anemia and was prescribed ferrous sulfate 325 mg twice daily with recommendations for outpatient colonoscopy. Patient has been advised to apply for financial assistance and schedule to see our financial counselor.  Fortunately today she continues to complain of bilateral lower extremity edema despite taking 40 mg of Lasix twice a day.  Will increase to 60 mg twice daily and have her follow-up in 1 week for BMP as there has been a decrease in her sodium.  Unclear if she is compliant with spironolactone as she actually has a paper prescription here that was ordered by the hospital upon her discharge on 04/18/2018.  We will go ahead and fill spironolactone as well today for compliance.   Review of Systems  Constitutional: Negative for fever, malaise/fatigue and weight loss.  HENT: Negative.  Negative for nosebleeds.        Poor dentition  Eyes: Negative.  Negative for blurred vision, double vision and photophobia.  Respiratory: Negative for cough, hemoptysis, sputum production, shortness of breath and wheezing.   Cardiovascular: Positive for leg swelling. Negative for chest  pain and palpitations.  Gastrointestinal: Negative.  Negative for heartburn, nausea and vomiting.  Musculoskeletal: Negative.  Negative for myalgias.  Neurological: Negative.  Negative for dizziness, focal weakness, seizures and headaches.  Psychiatric/Behavioral: Positive for substance abuse. Negative for suicidal ideas.    Past Medical History:  Diagnosis Date  . Breast cancer ([place])   . Breast cancer, stage 2 ([place]) 02/23/2011  . COPD (chronic obstructive pulmonary disease) ([place])   . Hypertension   . TIA (transient ischemic attack) 11/25/2011   "first time" (11/25/2011)    Past Surgical History:  Procedure Laterality Date  . BREAST BIOPSY  2007   left  . MASTECTOMY  09/2005   left  . TONSILLECTOMY AND ADENOIDECTOMY     "when I was little" (11/25/2011)  . TUBAL LIGATION  1993    Family History  Problem Relation Age of Onset  . COPD Father   . Sickle cell anemia Cousin        maternal cousin    Social History Reviewed with no changes to be made today.   Outpatient Medications Prior to Visit  Medication Sig Dispense Refill  . albuterol (PROVENTIL) (2.5 MG/3ML) 0.083% nebulizer solution Take 3 mLs (2.5 mg total) by nebulization every 6 (six) hours as needed for wheezing or shortness of breath. 150 mL 1  . amLODipine (NORVASC) 5 MG tablet Take 1 tablet (5 mg total) by mouth daily. 90 tablet 0  . aspirin EC 81 MG EC tablet Take 1 tablet (81 mg total) by mouth daily. 30 tablet 0  . atorvastatin (LIPITOR) 40 MG tablet Take 1 tablet (40 mg total) by mouth daily at 6 PM. 90 tablet 0  . budesonide-formoterol (SYMBICORT) 80-4.5 MCG/ACT inhaler Inhale 2 puffs into the lungs 2 (two) times daily. 1 Inhaler 0  . cyclobenzaprine (FLEXERIL) 10 MG tablet Take 1 tablet (10 mg total) by mouth 2 (two) times daily as needed for up to 10 doses for muscle spasms. 10 tablet 0  . ferrous sulfate 325 (65 FE) MG tablet Take 1 tablet (325 mg total) by mouth 2 (two) times daily with a meal. 60 tablet 0  .  fluticasone (FLONASE) 50 MCG/ACT nasal spray Place 2 sprays into both nostrils daily. 16 g 0  . lisinopril (PRINIVIL,ZESTRIL) 20 MG tablet Take 1 tablet (20 mg total) by mouth daily. 90 tablet 0  . Multiple Vitamin (MULTIVITAMIN WITH MINERALS) TABS tablet Take 1 tablet by mouth daily.    . nicotine (NICODERM CQ - DOSED IN MG/24 HOURS) 14 mg/24hr patch Place 1 patch (14 mg total) onto the skin daily. 28 patch 0  . omeprazole (PRILOSEC) 20 MG capsule Take 1 capsule (20 mg total) by mouth daily. 90 capsule 3  . senna-docusate (SENOKOT-S) 8.6-50 MG tablet Take 1 tablet by mouth 2 (two) times daily as needed for mild constipation. 60 tablet 0  . tiotropium (SPIRIVA HANDIHALER) 18 MCG inhalation capsule Place 1 capsule (18 mcg total) into inhaler and inhale daily. 30 capsule 11  . diclofenac sodium (VOLTAREN) 1 % GEL Apply 2 g topically 4 (four) times daily.    . furosemide (LASIX) 20 MG tablet Take 2 tablets (  40 mg total) by mouth 2 (two) times daily. 120 tablet 2  . spironolactone (ALDACTONE) 25 MG tablet Take 1 tablet (25 mg total) by mouth daily. 30 tablet 0  . albuterol (PROVENTIL HFA) 108 (90 Base) MCG/ACT inhaler Inhale 1-2 puffs into the lungs every 6 (six) hours as needed for up to 1 day for wheezing or shortness of breath (cough). 1 Inhaler 1   No facility-administered medications prior to visit.     Allergies  Allergen Reactions  . Penicillins Other (See Comments)    Pt had slight throat swelling Has patient had a PCN reaction causing immediate rash, facial/tongue/throat swelling, SOB or lightheadedness with hypotension: yes Has patient had a PCN reaction causing severe rash involving mucus membranes or skin necrosis: No Has patient had a PCN reaction that required hospitalization No Has patient had a PCN reaction occurring within the last 10 years: No If all of the above answers are "NO", then may proceed with Cephalosporin use.        Objective:    BP 114/67 (BP Location: Right  Arm, Patient Position: Sitting, Cuff Size: Small)   Pulse (!) 109   Temp 98.1 F (36.7 C) (Oral)   Ht 5\' 5"  (1.651 m)   Wt 128 lb 3.2 oz (58.2 kg)   LMP 02/18/2015 (Exact Date) Comment: MAYBE ONCE A YEAR  SpO2 96%   BMI 21.33 kg/m  Wt Readings from Last 3 Encounters:  04/23/18 128 lb 3.2 oz (58.2 kg)  04/18/18 133 lb 3.2 oz (60.4 kg)  03/05/18 132 lb 9.6 oz (60.1 kg)    Physical Exam Vitals signs and nursing note reviewed.  Constitutional:      Appearance: She is well-developed.  HENT:     Head: Normocephalic and atraumatic.     Mouth/Throat:     Dentition: Abnormal dentition. Does not have dentures. Dental caries and dental abscesses present. No gingival swelling.     Comments: She has very poor dentition and is partially edentulous with multiple caries.  There is some tenderness, erythema and swelling surrounding the right lower molar  Neck:     Musculoskeletal: Normal range of motion.     Vascular: No JVD.  Cardiovascular:     Rate and Rhythm: Regular rhythm. Tachycardia present.     Heart sounds: Normal heart sounds. No murmur. No friction rub. No gallop.   Pulmonary:     Effort: Pulmonary effort is normal. No tachypnea or respiratory distress.     Breath sounds: Normal breath sounds. No decreased breath sounds, wheezing, rhonchi or rales.  Chest:     Chest wall: No tenderness.  Abdominal:     General: Bowel sounds are normal.     Palpations: Abdomen is soft.  Musculoskeletal: Normal range of motion.        General: Swelling present.     Right lower leg: Edema (2+ pitting edema) present.     Left lower leg: Edema (2+ pitting edema) present.  Skin:    General: Skin is warm and dry.  Neurological:     Mental Status: She is alert and oriented to person, place, and time.     Coordination: Coordination normal.  Psychiatric:        Behavior: Behavior normal. Behavior is cooperative.        Thought Content: Thought content normal.        Judgment: Judgment normal.           Patient has been counseled extensively about nutrition and exercise  as well as the importance of adherence with medications and regular follow-up. The patient was given clear instructions to go to ER or return to medical center if symptoms don't improve, worsen or new problems develop. The patient verbalized understanding.   Follow-up: No follow-ups on file.   Gildardo Pounds, FNP-BC [facility] [place], [place]   04/23/2018, 6:51 PM

## 2018-04-26 ENCOUNTER — Telehealth: Payer: Self-pay

## 2018-04-26 NOTE — Telephone Encounter (Signed)
Sabrina Holland you have a patient on your schedule on April 20 at 9am.She is a patient of Dr. Loletha Grayer. She states that she needs to be seen in the office because her heart has been acting up.

## 2018-04-29 ENCOUNTER — Other Ambulatory Visit: Payer: Self-pay

## 2018-04-29 ENCOUNTER — Emergency Department (HOSPITAL_COMMUNITY)
Admission: EM | Admit: 2018-04-29 | Discharge: 2018-04-29 | Disposition: A | Discharge status: Home / Self Care | Payer: Self-pay | Attending: Emergency Medicine | Admitting: Emergency Medicine

## 2018-04-29 ENCOUNTER — Emergency Department (HOSPITAL_COMMUNITY): Payer: Self-pay

## 2018-04-29 DIAGNOSIS — Z5320 Procedure and treatment not carried out because of patient's decision for unspecified reasons: Secondary | ICD-10-CM | POA: Insufficient documentation

## 2018-04-29 DIAGNOSIS — Z853 Personal history of malignant neoplasm of breast: Secondary | ICD-10-CM | POA: Insufficient documentation

## 2018-04-29 DIAGNOSIS — Z79899 Other long term (current) drug therapy: Secondary | ICD-10-CM | POA: Insufficient documentation

## 2018-04-29 DIAGNOSIS — Z7982 Long term (current) use of aspirin: Secondary | ICD-10-CM | POA: Insufficient documentation

## 2018-04-29 DIAGNOSIS — I5042 Chronic combined systolic (congestive) and diastolic (congestive) heart failure: Secondary | ICD-10-CM | POA: Insufficient documentation

## 2018-04-29 DIAGNOSIS — Z9012 Acquired absence of left breast and nipple: Secondary | ICD-10-CM | POA: Insufficient documentation

## 2018-04-29 DIAGNOSIS — Z8673 Personal history of transient ischemic attack (TIA), and cerebral infarction without residual deficits: Secondary | ICD-10-CM | POA: Insufficient documentation

## 2018-04-29 DIAGNOSIS — R0781 Pleurodynia: Secondary | ICD-10-CM | POA: Insufficient documentation

## 2018-04-29 DIAGNOSIS — R0789 Other chest pain: Secondary | ICD-10-CM | POA: Insufficient documentation

## 2018-04-29 DIAGNOSIS — I110 Hypertensive heart disease with heart failure: Secondary | ICD-10-CM | POA: Insufficient documentation

## 2018-04-29 DIAGNOSIS — R079 Chest pain, unspecified: Secondary | ICD-10-CM

## 2018-04-29 DIAGNOSIS — J449 Chronic obstructive pulmonary disease, unspecified: Secondary | ICD-10-CM | POA: Insufficient documentation

## 2018-04-29 DIAGNOSIS — F17210 Nicotine dependence, cigarettes, uncomplicated: Secondary | ICD-10-CM | POA: Insufficient documentation

## 2018-04-29 LAB — TROPONIN I: Troponin I: 0.04 ng/mL (ref <0.03)

## 2018-04-29 LAB — CBC WITH DIFFERENTIAL/PLATELET
Abs Immature Granulocytes: 0.01 10*3/uL (ref 0.00–0.07)
Basophils Absolute: 0.1 10*3/uL (ref 0.0–0.1)
Basophils Relative: 1 %
Eosinophils Absolute: 0.1 10*3/uL (ref 0.0–0.5)
Eosinophils Relative: 1 %
HCT: 38.1 % (ref 36.0–46.0)
Hemoglobin: 12 g/dL (ref 12.0–15.0)
Immature Granulocytes: 0 %
Lymphocytes Relative: 44 %
Lymphs Abs: 2 10*3/uL (ref 0.7–4.0)
MCH: 22.4 pg — ABNORMAL LOW (ref 26.0–34.0)
MCHC: 31.5 g/dL (ref 30.0–36.0)
MCV: 71.2 fL — ABNORMAL LOW (ref 80.0–100.0)
Monocytes Absolute: 0.6 10*3/uL (ref 0.1–1.0)
Monocytes Relative: 13 %
Neutro Abs: 1.9 10*3/uL (ref 1.7–7.7)
Neutrophils Relative %: 41 %
Platelets: 416 10*3/uL — ABNORMAL HIGH (ref 150–400)
RBC: 5.35 MIL/uL — ABNORMAL HIGH (ref 3.87–5.11)
RDW: 20 % — ABNORMAL HIGH (ref 11.5–15.5)
WBC: 4.6 10*3/uL (ref 4.0–10.5)
nRBC: 0 % (ref 0.0–0.2)

## 2018-04-29 LAB — BASIC METABOLIC PANEL
Anion gap: 12 (ref 5–15)
BUN: 22 mg/dL — ABNORMAL HIGH (ref 6–20)
CO2: 25 mmol/L (ref 22–32)
Calcium: 9.3 mg/dL (ref 8.9–10.3)
Chloride: 99 mmol/L (ref 98–111)
Creatinine, Ser: 1.13 mg/dL — ABNORMAL HIGH (ref 0.44–1.00)
GFR calc Af Amer: >60 mL/min (ref >60)
GFR calc non Af Amer: 57 mL/min — ABNORMAL LOW (ref >60)
Glucose, Bld: 146 mg/dL — ABNORMAL HIGH (ref 70–99)
Potassium: 3.8 mmol/L (ref 3.5–5.1)
Sodium: 136 mmol/L (ref 135–145)

## 2018-04-29 LAB — D-DIMER, QUANTITATIVE: D-Dimer, Quant: 1.16 ug/mL-FEU — ABNORMAL HIGH (ref 0.00–0.50)

## 2018-04-29 MED ORDER — LIDOCAINE 5 % EX PTCH: 1.0000 | MEDICATED_PATCH | CUTANEOUS | 0 refills | Status: DC

## 2018-04-29 MED ORDER — KETOROLAC TROMETHAMINE 30 MG/ML IJ SOLN: 30.0000 mg | Freq: Once | INTRAMUSCULAR | Status: DC

## 2018-04-29 NOTE — ED Provider Notes (Signed)
[facility] EMERGENCY DEPARTMENT Provider Note   CSN: [id number] Arrival date & time: 04/29/18  0175    History   Chief Complaint Chief Complaint  Patient presents with   Chest Pain    HPI Sabrina Holland is a 48 y.o. female with a PMH of substance abuse, breast cancer, COPD, TIA, CHF, and HTN presenting with intermittent sharp right sided rib pain onset 2-3 days ago. Patient states pain began after falling at work 3-4 weeks ago. Patient reports she was evaluated at [facility] after the fall and told she had rib fractures. Patient states the medicine at [facility] helped her symptoms, but she states she began having symptoms again 2-3 days ago. Patient reports taking ibuprofen with minimal relief. Patient denies another injury. Patient states pain is worse with deep breaths and coughing. Patient reports a chronic cough due to smoking. Patient denies fever, congestion, rhinorrhea, abdominal pain, nausea, vomiting, or shortness of breath. Patient denies sick contacts or recent travel. Patient denies using OCPs, recent travel, recent surgery, leg edema/pain, or hx of DVT/PE. LMP in 2009. Patient reports drug use including cocaine and marijuana. Patient reports last use was last week. Patient reports occasional alcohol and tobacco use.      HPI  Past Medical History:  Diagnosis Date   Breast cancer ([place])    Breast cancer, stage 2 ([place]) 02/23/2011   COPD (chronic obstructive pulmonary disease) ([place])    Hypertension    TIA (transient ischemic attack) 11/25/2011   "first time" (11/25/2011)    Patient Active Problem List   Diagnosis Date Noted   Respiratory distress 04/18/2018   Elevated LFTs 04/18/2018   Acute CHF (congestive heart failure) ([place]) 04/16/2018   CHF exacerbation ([place]) 04/16/2018   Malnutrition of moderate degree 02/27/2018   Dyspnea 02/26/2018   Acute on chronic systolic CHF (congestive heart failure) ([place]) 01/20/2018   Acute on  chronic systolic and diastolic heart failure, NYHA class 3 ([place]) 01/20/2018   Cocaine abuse ([place]) 12/19/2017   Mixed hyperlipidemia 12/19/2017   COPD exacerbation ([place])    Acute on chronic respiratory failure with hypoxemia ([place]) 12/03/2017   Cardiomyopathy ([place]) 07/04/2017   COPD (chronic obstructive pulmonary disease) ([place])    Chronic combined systolic and diastolic heart failure ([place])    Anemia 06/20/2017   Polysubstance abuse ([place]) 11/26/2011   TIA (transient ischemic attack) 11/25/2011   Essential hypertension 11/25/2011   Sinusitis 11/25/2011   Vitamin B 12 deficiency 07/18/2011   History of breast cancer 02/23/2011    Past Surgical History:  Procedure Laterality Date   BREAST BIOPSY  2007   left   MASTECTOMY  09/2005   left   TONSILLECTOMY AND ADENOIDECTOMY     "when I was little" (11/25/2011)   TUBAL LIGATION  1993     OB History    Gravida  5   Para  4   Term  4   Preterm  0   AB  1   Living        SAB  1   TAB  0   Ectopic  0   Multiple      Live Births               Home Medications    Prior to Admission medications   Medication Sig Start Date End Date Taking? Authorizing Provider  albuterol (PROVENTIL HFA) 108 (90 Base) MCG/ACT inhaler Inhale 1-2 puffs into the lungs every 6 (six) hours as needed for  up to 1 day for wheezing or shortness of breath (cough). 03/05/18 04/17/18  Gildardo Pounds, NP  albuterol (PROVENTIL) (2.5 MG/3ML) 0.083% nebulizer solution Take 3 mLs (2.5 mg total) by nebulization every 6 (six) hours as needed for wheezing or shortness of breath. 03/05/18   Gildardo Pounds, NP  amLODipine (NORVASC) 5 MG tablet Take 1 tablet (5 mg total) by mouth daily. 03/05/18 06/03/18  Gildardo Pounds, NP  aspirin EC 81 MG EC tablet Take 1 tablet (81 mg total) by mouth daily. 06/24/17   Reyne Dumas, MD  atorvastatin (LIPITOR) 40 MG tablet Take 1 tablet (40 mg total) by mouth daily at 6 PM. 03/05/18   Gildardo Pounds, NP    budesonide-formoterol (SYMBICORT) 80-4.5 MCG/ACT inhaler Inhale 2 puffs into the lungs 2 (two) times daily. 03/05/18   Gildardo Pounds, NP  clindamycin (CLEOCIN) 300 MG capsule Take 1 capsule (300 mg total) by mouth 3 (three) times daily for 7 days. 04/23/18 04/30/18  Gildardo Pounds, NP  cyclobenzaprine (FLEXERIL) 10 MG tablet Take 1 tablet (10 mg total) by mouth 2 (two) times daily as needed for up to 10 doses for muscle spasms. 04/02/18   Curatolo, Adam, DO  diclofenac sodium (VOLTAREN) 1 % GEL Apply 2 g topically 4 (four) times daily. 04/23/18   Gildardo Pounds, NP  ferrous sulfate 325 (65 FE) MG tablet Take 1 tablet (325 mg total) by mouth 2 (two) times daily with a meal. 04/18/18   Norval Morton, MD  fluticasone (FLONASE) 50 MCG/ACT nasal spray Place 2 sprays into both nostrils daily. 12/05/17   Eugenie Filler, MD  furosemide (LASIX) 20 MG tablet Take 3 tablets (60 mg total) by mouth 2 (two) times daily for 30 days. 04/23/18 05/23/18  Gildardo Pounds, NP  lidocaine (LIDODERM) 5 % Place 1 patch onto the skin daily. Remove & Discard patch within 12 hours or as directed by MD 04/29/18   Darlin Drop P, PA-C  lisinopril (PRINIVIL,ZESTRIL) 20 MG tablet Take 1 tablet (20 mg total) by mouth daily. 03/05/18 06/03/18  Gildardo Pounds, NP  Multiple Vitamin (MULTIVITAMIN WITH MINERALS) TABS tablet Take 1 tablet by mouth daily.    [provider]  nicotine (NICODERM CQ - DOSED IN MG/24 HOURS) 14 mg/24hr patch Place 1 patch (14 mg total) onto the skin daily. 03/05/18   Gildardo Pounds, NP  omeprazole (PRILOSEC) 20 MG capsule Take 1 capsule (20 mg total) by mouth daily. 03/05/18 06/03/18  Gildardo Pounds, NP  senna-docusate (SENOKOT-S) 8.6-50 MG tablet Take 1 tablet by mouth 2 (two) times daily as needed for mild constipation. 04/18/18   Norval Morton, MD  spironolactone (ALDACTONE) 25 MG tablet Take 1 tablet (25 mg total) by mouth daily. 04/23/18 07/22/18  Gildardo Pounds, NP  tiotropium (SPIRIVA  HANDIHALER) 18 MCG inhalation capsule Place 1 capsule (18 mcg total) into inhaler and inhale daily. 03/05/18 03/05/19  Gildardo Pounds, NP    Family History Family History  Problem Relation Age of Onset   COPD Father    Sickle cell anemia Cousin        maternal cousin    Social History Social History   Tobacco Use   Smoking status: Current Some Day Smoker    Packs/day: 0.00    Years: 20.00    Pack years: 0.00    Types: Cigarettes   Smokeless tobacco: Never Used   Tobacco comment: 1 or 2 cigarettes a day- approx  1 pack week  Substance Use Topics   Alcohol use: Yes    Alcohol/week: 12.0 standard drinks    Types: 12 Cans of beer per week    Comment: occasionally   Drug use: Yes    Types: Marijuana, Cocaine    Comment: patient states she uses cocaine and marijuana daily     Allergies   Penicillins   Review of Systems Review of Systems  Constitutional: Negative for activity change, appetite change, chills, diaphoresis, fatigue, fever and unexpected weight change.  HENT: Negative for congestion and rhinorrhea.   Respiratory: Positive for cough (Pt reports this is chronic.) and shortness of breath. Negative for chest tightness and wheezing.   Cardiovascular: Positive for chest pain (Chest pain is localized to the right ribs.). Negative for palpitations and leg swelling.  Gastrointestinal: Negative for abdominal pain, nausea and vomiting.  Endocrine: Negative for cold intolerance and heat intolerance.  Genitourinary: Negative for dysuria.  Musculoskeletal: Negative for back pain and gait problem.  Skin: Negative for rash.  Neurological: Negative for dizziness, syncope, weakness and light-headedness.  Psychiatric/Behavioral: Negative for agitation and behavioral problems. The patient is not nervous/anxious.     Physical Exam Updated Vital Signs BP 118/90    Pulse (!) 113    Temp 97.8 F (36.6 C) (Oral)    Resp 18    Ht 5\' 5"  (1.651 m)    Wt 58.2 kg    LMP  02/18/2015 (Exact Date) Comment: MAYBE ONCE A YEAR   SpO2 99%    BMI 21.33 kg/m   Physical Exam Vitals signs and nursing note reviewed.  Constitutional:      General: She is not in acute distress.    Appearance: She is well-developed. She is not diaphoretic.     Comments: Patient is eating a biscuit throughout exam in no acute distress.  HENT:     Head: Normocephalic and atraumatic.  Neck:     Musculoskeletal: Normal range of motion and neck supple.     Vascular: No JVD.  Cardiovascular:     Rate and Rhythm: Regular rhythm. Tachycardia present.     Pulses: Normal pulses.          Radial pulses are 2+ on the right side and 2+ on the left side.       Dorsalis pedis pulses are 2+ on the right side and 2+ on the left side.     Heart sounds: Normal heart sounds. No murmur. No friction rub. No gallop.   Pulmonary:     Effort: Pulmonary effort is normal. No accessory muscle usage or respiratory distress.     Breath sounds: Normal breath sounds. No wheezing, rhonchi or rales.  Chest:     Chest wall: Tenderness present. No deformity.     Comments: Right sided rib tenderness to palpation.  Abdominal:     Palpations: Abdomen is soft.     Tenderness: There is no abdominal tenderness.  Musculoskeletal: Normal range of motion.     Right lower leg: She exhibits no tenderness. No edema.     Left lower leg: She exhibits no tenderness. No edema.  Skin:    General: Skin is warm.     Capillary Refill: Capillary refill takes less than 2 seconds.     Coloration: Skin is not pale.     Findings: No rash.  Neurological:     Mental Status: She is alert.     ED Treatments / Results  Labs (all labs ordered are listed, but  only abnormal results are displayed) Labs Reviewed  TROPONIN I - Abnormal; Notable for the following components:      Result Value   Troponin I 0.04 (*)    All other components within normal limits  D-DIMER, QUANTITATIVE (NOT AT [facility]) - Abnormal; Notable for the following  components:   D-Dimer, Quant 1.16 (*)    All other components within normal limits  CBC WITH DIFFERENTIAL/PLATELET - Abnormal; Notable for the following components:   RBC 5.35 (*)    MCV 71.2 (*)    MCH 22.4 (*)    RDW 20.0 (*)    Platelets 416 (*)    All other components within normal limits  BASIC METABOLIC PANEL - Abnormal; Notable for the following components:   Glucose, Bld 146 (*)    BUN 22 (*)    Creatinine, Ser 1.13 (*)    GFR calc non Af Amer 57 (*)    All other components within normal limits  URINALYSIS, ROUTINE W REFLEX MICROSCOPIC    EKG EKG Interpretation  Date/Time:  Sunday April 29 2018 10:05:42 EDT Ventricular Rate:  110 PR Interval:    QRS Duration: 105 QT Interval:  363 QTC Calculation: 492 R Axis:   123 Text Interpretation:  Sinus tachycardia Atrial premature complex Biatrial enlargement Anteroseptal infarct, age indeterminate Confirmed by Quintella Reichert [phone]) on 04/29/2018 11:40:13 AM   Radiology Dg Ribs Unilateral W/chest Right  Result Date: 04/29/2018 CLINICAL DATA:  Right chest pain after falling at work 1 week ago. EXAM: RIGHT RIBS AND CHEST - 3+ VIEW COMPARISON:  Chest radiographs 03/20/2018 and 04/02/2018. FINDINGS: Stable cardiomegaly and chronic vascular congestion. There is mild atelectasis at the right lung base. No pleural effusion or pneumothorax. A metallic BB was placed over the area of pain along the inferolateral right costal margin. Best seen on the oblique view is a nondisplaced fracture of the right 7th rib laterally. There are questionable nondisplaced fractures of the 6th and 9th ribs as well. There is a mild convex left thoracic scoliosis. Stable postsurgical changes consistent with previous left mastectomy and axillary node dissection. IMPRESSION: 1. Evidence of at least one nondisplaced right-sided rib fracture. 2. No evidence of pleural effusion or pneumothorax. Stable cardiomegaly and chronic vascular congestion. Electronically  Signed   By: Richardean Sale M.D.   On: 04/29/2018 11:18    Procedures Procedures (including critical care time)  Medications Ordered in ED Medications - No data to display   Initial Impression / Assessment and Plan / ED Course  I have reviewed the triage vital signs and the nursing notes.  Pertinent labs & imaging results that were available during my care of the patient were reviewed by me and considered in my medical decision making (see chart for details).   Clinical Course as of Apr 29 1199  Sun Apr 29, 2018  1122 Evidence of at least one nondisplaced right-sided rib fracture noted on x ray.  DG Ribs Unilateral W/Chest Right [AH]  1124 Patient refused toradol and CTA.    [AH]  1149 Troponin elevated at 0.04. Troponin appears to be elevated at baseline. Troponin is lower than previous value 13 days ago which was 0.05.   Troponin I - ONCE - STAT(!!) [AH]  1151 D dimer elevated at 1.16. Ordered CTA but patient was persistent and refuses further treatment.   D-Dimer, Quant(!): 1.16 [AH]    Clinical Course User Index [AH] Arville Lime, PA-C      Patient presents with right sided rib  pain. Patient had elevated troponin and elevated d dimer. CXR reveals a nondisplaced right sided rib fracture. EKG reveals sinus tachycardia. Discussed findings with patient and advised further evaluation with a CTA and a repeat troponin. Patient became aggressive. Patient requested to be discharged on pain medicine. I discussed with patient that I cannot do this due to history of drug abuse. Discussed in detail the need for further workup and patient refuses. Patient states she firmly believes she does not need further work up and states she does not want an IV. Patient started yelling during our conversation.   The patient and I discussed the nature and purpose, risks and benefits, as well as, the alternatives of treatment. Time was given to allow the opportunity to ask questions and consider  options. After the discussion, the patient decided to refuse the offerred treatment. The patient was informed that refusal could lead to, but was not limited to, death, permanent disability, or severe pain. If present, I asked the relatives and/or significant others to dissuade the patient without success. Prior to refusing, I determined that the patient had the capacity to make their decision and understood the consequences of that decision. After refusal, I made every reasonable effort to treat them to the best of my ability.  The patient was notified that they may return to the emergency department at any time for further treatment.    I have discussed my concerns as his provider and the possibility that this may worsen. I have specifically discussed that without further evaluation I cannot guarantee there is not a life threatening event occuring.  Pt is A&Ox4, his own POA and states understanding of my concerns and the possible consequences.  I have made pt aware that this is an [place] discharge, but she may return at any time for further evaluation and treatment. Will prescribe lidoderm patch for symptoms.   Findings and plan of care discussed with supervising physician Dr. Ralene Bathe.  Final Clinical Impressions(s) / ED Diagnoses   Final diagnoses:  Nonspecific chest pain  Rib pain on right side    ED Discharge Orders         Ordered    lidocaine (LIDODERM) 5 %  Every 24 hours     04/29/18 1201           Darlin Drop [place], [place] 04/29/18 1203    Quintella Reichert, MD 04/29/18 1213

## 2018-04-29 NOTE — ED Notes (Signed)
Pt stated she is unable to void at this time. RN notified.

## 2018-04-29 NOTE — ED Notes (Signed)
Pt given discharge instructions, follow up information and prescription. Pt given the opportunity to ask questions. Pt verbalized understanding. Pt discharged without incident.

## 2018-04-29 NOTE — ED Notes (Signed)
Patient transported to X-ray 

## 2018-04-29 NOTE — ED Notes (Signed)
Patient verbalizes understanding of discharge instructions. Opportunity for questioning and answering were provided.  patient discharged from ED.  

## 2018-04-29 NOTE — Discharge Instructions (Addendum)
You have been seen today for chest pain/rib pain. Please read and follow all provided instructions.   1. Medications: usual home medications 2. Treatment: rest, drink plenty of fluids 3. Follow Up: Please follow up with your primary doctor in 1 day for discussion of your diagnoses and further evaluation after today's visit; if you do not have a primary care doctor use the resource guide provided to find one; Please return to the ER for any new or worsening symptoms. Please obtain all of your results from medical records or have your doctors office obtain the results - share them with your doctor - you should be seen at your doctors office. Call today to arrange your follow up.   Take medications as prescribed. Please review all of the medicines and only take them if you do not have an allergy to them. Return to the emergency room for worsening condition or new concerning symptoms. Follow up with your regular doctor. If you don't have a regular doctor use one of the numbers below to establish a primary care doctor.  Please be aware that if you are taking birth control pills, taking other prescriptions, ESPECIALLY ANTIBIOTICS may make the birth control ineffective - if this is the case, either do not engage in sexual activity or use alternative methods of birth control such as condoms until you have finished the medicine and your family doctor says it is OK to restart them. If you are on a blood thinner such as COUMADIN, be aware that any other medicine that you take may cause the coumadin to either work too much, or not enough - you should have your coumadin level rechecked in next 7 days if this is the case.  ?  It is also a possibility that you have an allergic reaction to any of the medicines that you have been prescribed - Everybody reacts differently to medications and while MOST people have no trouble with most medicines, you may have a reaction such as nausea, vomiting, rash, swelling, shortness of  breath. If this is the case, please stop taking the medicine immediately and contact your physician.  ?  You should return to the ER if you develop severe or worsening symptoms.   Emergency Department Resource Guide 1) Find a Doctor and Pay Out of [place]ocket Although you won't have to find out who is covered by your insurance plan, it is a good idea to ask around and get recommendations. You will then need to call the office and see if the doctor you have chosen will accept you as a new patient and what types of options they offer for patients who are self-pay. Some doctors offer discounts or will set up payment plans for their patients who do not have insurance, but you will need to ask so you aren't surprised when you get to your appointment.  2) Contact Your Local Health Department Not all health departments have doctors that can see patients for sick visits, but many do, so it is worth a call to see if yours does. If you don't know where your local health department is, you can check in your phone book. The CDC also has a tool to help you locate your state's health department, and many state websites also have listings of all of their local health departments.  3) Find a [facility] If your illness is not likely to be very severe or complicated, you may want to try a walk in clinic. These are popping up all  over the country in pharmacies, drugstores, and shopping centers. They're usually staffed by nurse practitioners or physician assistants that have been trained to treat common illnesses and complaints. They're usually fairly quick and inexpensive. However, if you have serious medical issues or chronic medical problems, these are probably not your best option.  No Primary Care Doctor: Call Health Connect at  [phone] - they can help you locate a primary care doctor that  accepts your insurance, provides certain services, etc. Physician Referral Service- [phone]  Chronic Pain  Problems: Organization         Address  Phone   Notes  [facility]  [phone] Patients need to be referred by their primary care doctor.   Medication Assistance: Organization         Address  Phone   Notes  [facility] Medication [facility] [address] [place], [place] [address] [phone] --Must be a resident of [facility] -- Must have NO insurance coverage whatsoever (no Medicaid/ Medicare, etc.) -- The pt. MUST have a primary care doctor that directs their care regularly and follows them in the community   [facility]  [phone]   [employer]  [phone]    Agencies that provide inexpensive medical care: Organization         Address  Phone   Notes  [place]  [phone]   [facility]    [phone]   [facility] [place], [place] [address] [phone]   [place] [address], [place] [phone]   [facility]anned [facility]    [phone]   [facility]    [phone]   [place] and [place] [address], [place] Phone:  [phone], Fax:  [phone] Hours of Operation:  9 am - 6 pm, M-F.  Also accepts Medicaid/Medicare and self-pay.  [facility] for [place] [address], [address], [place] Phone: [phone], Fax: [phone]. Hours of Operation:  8:30 am - 5:30 pm, M-F.  Also accepts Medicaid and self-pay.  [facility] [place] [address], [place] Phone: [phone]   [place], [place], [place] [phone], Ext. [phone] Mondays & Thursdays: 7-9 AM.  First 15 patients are seen on a first come, first serve basis.    [place] Providers:  Organization         Address  Phone   Notes  [facility] [address], [place] [phone] Also  accepts self-pay patients.  [facility] [address], [place]  [phone]   [place], [address], [place] [phone]   [facility] [address] [place] [phone]   Lucianne Lei [address], [place]   [phone] Only accepts [place] [facility] [place] patients after they have their name applied to their card.   Self-Pay (no insurance) in [facility]:  Organization         Address  Phone   Notes  Sickle Cell Patients, [facility] [place] [phone]   [facility] [place] [phone]   [facility] [place]  [address], [place] [phone]   [facility]/Dr. Osei-Bonsu  [address] [place] or [address], [place] [phone] Phone number for both [place] and [place] locations is the same.  Urgent Medical and [facility] [address], [place] [phone]   [facility] [address] [place] or [address] [phone]   [facility] [address], [place] [phone], phone; [phone], fax Sees patients 1st and 3rd Saturday of every month.  Must not qualify for public or private insurance (i.e. Medicaid, Medicare,  Health Choice, Veterans' Benefits)  Household income should be no more than 200% of the poverty level The clinic cannot treat you if you are pregnant or think you are pregnant  Sexually transmitted diseases are not treated at the clinic.

## 2018-04-29 NOTE — ED Notes (Signed)
Pt refusing IV , CT SCAN AND IV TORADOL ; [place], [place] aware ; no further orders received

## 2018-04-29 NOTE — ED Notes (Signed)
Patient ok to be discharged without urine sample per PA

## 2018-04-29 NOTE — ED Triage Notes (Signed)
Pt presents to the ED with right sided rib cage pain that occurs when she breathes in. Pt reports she was recently seen at [facility] and was diagnosed with fractured ribs on that side. Pt reports that it is an 11/10 pain when she takes a breath in or cough. Pt reports that she normally has a cough. Pt reports she did take 800mg  of ibuprofen she had at home but it hasn't been helping for the last 2-3 days.

## 2018-04-30 NOTE — Telephone Encounter (Signed)
Sent note to [place]

## 2018-04-30 NOTE — Telephone Encounter (Signed)
That's fine, she can see Dr. Loletha Grayer on a day Dr. Sallyanne Kuster is the DOD. She went to ED yesterday, I reviewed her ED course, she is in sinus tachycardia, but that is in the presence of a rib fracture. She also had elevated d-dimer, but refused CTA of chest to rule out PE.

## 2018-05-03 NOTE — Telephone Encounter (Signed)
Spoke with pt she is feeling better but does not feel comfortable doing virtual visit with H.Meng PA as she has never met in in person. She is willing to do virtual visit with Dr. Loletha Grayer or L.Kilroy, Pa as she she feel they are more familiar with her. Let her know will cancel Monday's apt and someone will be in touch with her to schdule on Dr. Loletha Grayer or L.Kilory's schedule. Pt was appreciative and verbalized understanding.

## 2018-05-03 NOTE — Telephone Encounter (Signed)
Called patient to get more details on her cardiac issues and schedule in person OV with DOD if needed. Pt has schedule virtual visit with HM on 4/20 that has not been cancelled at this time. Pt if needs OV will see DOD assigned for day as Dr. Loletha Grayer not DOD in office until 4/14.  Left message with family member for pt to call back to discuss before her appt next week.

## 2018-05-07 ENCOUNTER — Telehealth: Payer: Self-pay | Admitting: Nurse Practitioner

## 2018-05-07 ENCOUNTER — Telehealth: Payer: Self-pay

## 2018-05-07 ENCOUNTER — Ambulatory Visit: Payer: Self-pay | Admitting: Physician Assistant

## 2018-05-07 MED FILL — !VENTOLIN HFA INHALER: 108 (90 BAS | 25 days supply | Qty: 18 | Fill #1

## 2018-05-07 MED FILL — CLINDAMYCIN HCL 300 MG CAP: 300 | 7 days supply | Qty: 21 | Fill #0

## 2018-05-07 NOTE — Telephone Encounter (Addendum)
Patient is scheduled with Dr. Loletha Grayer for 05/10/2018 at [facility]

## 2018-05-07 NOTE — Telephone Encounter (Signed)
Pt called in stated she would like to speak to her Dr she did not state reason

## 2018-05-07 NOTE — Telephone Encounter (Addendum)
Virtual Visit Pre-Appointment Phone Call  Steps For Call:  1. Confirm consent - "In the setting of the current Covid19 crisis, you are scheduled for a VIDEO visit with Dr. Sallyanne Kuster on 05/10/2018 at 8:00AM.  Just as we do with many in-office visits, in order for you to participate in this visit, we must obtain consent.  If you'd like, I can send this to your mychart (if signed up) or email for you to review.  Otherwise, I can obtain your verbal consent now.  All virtual visits are billed to your insurance company just like a normal visit would be.  By agreeing to a virtual visit, we'd like you to understand that the technology does not allow for your provider to perform an examination, and thus may limit your provider's ability to fully assess your condition. If your provider identifies any concerns that need to be evaluated in person, we will make arrangements to do so.  Finally, though the technology is pretty good, we cannot assure that it will always work on either your or our end, and in the setting of a video visit, we may have to convert it to a phone-only visit.  In either situation, we cannot ensure that we have a secure connection.  Are you willing to proceed?" STAFF: Did the patient verbally acknowledge consent to telehealth visit? Document YES/NO here: YES  2. Confirm the BEST phone number to call the day of the visit by including in appointment notes  3. Give patient instructions for WebEx/MyChart download to smartphone as below or Doximity/Doxy.me if video visit (depending on what platform provider is using)  4. Advise patient to be prepared with their blood pressure, heart rate, weight, any heart rhythm information, their current medicines, and a piece of paper and pen handy for any instructions they may receive the day of their visit  5. Inform patient they will receive a phone call 15 minutes prior to their appointment time (may be from unknown caller ID) so they should be prepared  to answer  6. Confirm that appointment type is correct in Epic appointment notes (VIDEO vs PHONE)     TELEPHONE CALL NOTE  Sabrina Holland has been deemed a candidate for a follow-up tele-health visit to limit community exposure during the Covid-19 pandemic. I spoke with the patient via phone to ensure availability of phone/video source, confirm preferred email & phone number, and discuss instructions and expectations.  I reminded Sabrina Holland to be prepared with any vital sign and/or heart rhythm information that could potentially be obtained via home monitoring, at the time of her visit. I reminded Sabrina Holland to expect a phone call at the time of her visit if her visit.  Sabrina Holland, [place] 05/07/2018 11:27 AM   INSTRUCTIONS FOR DOWNLOADING THE WEBEX APP TO SMARTPHONE  - If Apple, ask patient to go to App Store and type in WebEx in the search bar. [employer], the blue/green circle. If Android, go to [employer] and type in [employer] in the search bar. The app is free but as with any other app downloads, their phone may require them to verify saved payment information or Apple/Android password.  - The patient does NOT have to create an account. - On the day of the visit, the assist will walk the patient through joining the meeting with the meeting number/password.  INSTRUCTIONS FOR DOWNLOADING THE MYCHART APP TO SMARTPHONE  - The patient must first make sure to  have activated MyChart and know their login information - If Apple, go to [employer] and type in MyChart in the search bar and download the app. If Android, ask patient to go to [employer] and type in [facility] in the search bar and download the app. The app is free but as with any other app downloads, their phone may require them to verify saved payment information or Apple/Android password.  - The patient will need to then log into the app with their MyChart username and  password, and select [facility] as their healthcare provider to link the account. When it is time for your visit, go to the MyChart app, find appointments, and click Begin Video Visit. Be sure to Select Allow for your device to access the Microphone and Camera for your visit. You will then be connected, and your provider will be with you shortly.  **If they have any issues connecting, or need assistance please contact MyChart service desk [phone])**  **If using a computer, in order to ensure the best quality for their visit they will need to use either of the following Internet Browsers: [employer], or Google Chrome**  IF USING DOXIMITY or DOXY.ME - The patient will receive a link just prior to their visit, either by text or email (to be determined day of appointment depending on if it's doxy.me or Doximity).     FULL LENGTH CONSENT FOR TELE-HEALTH VISIT   I hereby voluntarily request, consent and authorize [facility] and its employed or contracted physicians, physician assistants, nurse practitioners or other licensed health care professionals (the Practitioner), to provide me with telemedicine health care services (the "Services") as deemed necessary by the treating Practitioner. I acknowledge and consent to receive the Services by the Practitioner via telemedicine. I understand that the telemedicine visit will involve communicating with the Practitioner through live audiovisual communication technology and the disclosure of certain medical information by electronic transmission. I acknowledge that I have been given the opportunity to request an in-person assessment or other available alternative prior to the telemedicine visit and am voluntarily participating in the telemedicine visit.  I understand that I have the right to withhold or withdraw my consent to the use of telemedicine in the course of my care at any time, without affecting my right to future care or  treatment, and that the Practitioner or I may terminate the telemedicine visit at any time. I understand that I have the right to inspect all information obtained and/or recorded in the course of the telemedicine visit and may receive copies of available information for a reasonable fee.  I understand that some of the potential risks of receiving the Services via telemedicine include:  Marland Kitchen Delay or interruption in medical evaluation due to technological equipment failure or disruption; . Information transmitted may not be sufficient (e.g. poor resolution of images) to allow for appropriate medical decision making by the Practitioner; and/or  . In rare instances, security protocols could fail, causing a breach of personal health information.  Furthermore, I acknowledge that it is my responsibility to provide information about my medical history, conditions and care that is complete and accurate to the best of my ability. I acknowledge that Practitioner's advice, recommendations, and/or decision may be based on factors not within their control, such as incomplete or inaccurate data provided by me or distortions of diagnostic images or specimens that may result from electronic transmissions. I understand that the practice of medicine is not an exact science and  that Practitioner makes no warranties or guarantees regarding treatment outcomes. I acknowledge that I will receive a copy of this consent concurrently upon execution via email to the email address I last provided but may also request a printed copy by calling the office of [facility].    I understand that my insurance will be billed for this visit.   I have read or had this consent read to me. . I understand the contents of this consent, which adequately explains the benefits and risks of the Services being provided via telemedicine.  . I have been provided ample opportunity to ask questions regarding this consent and the Services and have had my  questions answered to my satisfaction. . I give my informed consent for the services to be provided through the use of telemedicine in my medical care  By participating in this telemedicine visit I agree to the above.

## 2018-05-08 ENCOUNTER — Other Ambulatory Visit: Payer: Self-pay | Admitting: Nurse Practitioner

## 2018-05-08 ENCOUNTER — Other Ambulatory Visit: Payer: Self-pay

## 2018-05-08 DIAGNOSIS — J449 Chronic obstructive pulmonary disease, unspecified: Secondary | ICD-10-CM

## 2018-05-08 MED ORDER — BUDESONIDE-FORMOTEROL FUMARATE 80-4.5 MCG/ACT IN AERO: 2.0000 | INHALATION_SPRAY | Freq: Two times a day (BID) | RESPIRATORY_TRACT | 6 refills | Status: DC

## 2018-05-08 MED FILL — SYMBICORT 80-4.5 MCG INH: 80-4.5 | 30 days supply | Qty: 10 | Fill #0

## 2018-05-08 NOTE — Telephone Encounter (Signed)
CMA called back patient, no answer, and left a VM.

## 2018-05-09 ENCOUNTER — Telehealth: Payer: Self-pay | Admitting: Cardiovascular Disease

## 2018-05-09 NOTE — Telephone Encounter (Signed)
Smartphone/ virtual consent/ my chart/ pre reg completed °

## 2018-05-10 ENCOUNTER — Telehealth: Payer: Self-pay | Admitting: Cardiovascular Disease

## 2018-05-14 ENCOUNTER — Encounter: Payer: Self-pay | Admitting: Cardiovascular Disease

## 2018-05-14 ENCOUNTER — Telehealth (INDEPENDENT_AMBULATORY_CARE_PROVIDER_SITE_OTHER): Payer: Self-pay | Admitting: Cardiovascular Disease

## 2018-05-14 VITALS — Ht 65.0 in | Wt 132.0 lb

## 2018-05-14 DIAGNOSIS — E782 Mixed hyperlipidemia: Secondary | ICD-10-CM

## 2018-05-14 DIAGNOSIS — F1910 Other psychoactive substance abuse, uncomplicated: Secondary | ICD-10-CM

## 2018-05-14 DIAGNOSIS — Z853 Personal history of malignant neoplasm of breast: Secondary | ICD-10-CM

## 2018-05-14 DIAGNOSIS — I10 Essential (primary) hypertension: Secondary | ICD-10-CM

## 2018-05-14 DIAGNOSIS — J449 Chronic obstructive pulmonary disease, unspecified: Secondary | ICD-10-CM

## 2018-05-14 DIAGNOSIS — F1410 Cocaine abuse, uncomplicated: Secondary | ICD-10-CM

## 2018-05-14 DIAGNOSIS — I5043 Acute on chronic combined systolic (congestive) and diastolic (congestive) heart failure: Secondary | ICD-10-CM

## 2018-05-14 DIAGNOSIS — I429 Cardiomyopathy, unspecified: Secondary | ICD-10-CM

## 2018-05-14 MED ORDER — METOLAZONE 2.5 MG PO TABS: ORAL_TABLET | ORAL | 3 refills | Status: DC

## 2018-05-14 MED FILL — metOLazone 2.5 MG TABS: 2.5 | 30 days supply | Qty: 15 | Fill #0

## 2018-05-14 NOTE — Patient Instructions (Addendum)
Medication Instructions:  Take Metolazone 2.5 mg AS DIRECTED. Take 1 tablet (first dose) 30 minutes before your afternoon dose of furosemide tomorrow.  If you need a refill on your cardiac medications before your next appointment, please call your pharmacy.    Follow-Up: At [facility], you and your health needs are our priority.  As part of our continuing mission to provide you with exceptional heart care, we have created designated Provider Care Teams.  These Care Teams include your primary Cardiologist (physician) and Advanced Practice Providers (APPs -  Physician Assistants and Nurse Practitioners) who all work together to provide you with the care you need, when you need it.  You have been scheduled for a follow up virtual visit with Dr. Sallyanne Kuster on Thursday, 05/17/18, at 2:40 pm.  Any Other Special Instructions Will Be Listed Below (If Applicable).  Daily Weight Record: It is important to weigh yourself daily. Keep your daily weight chart near your scale. Weigh yourself each morning at the same time. Weigh yourself without shoes, and try to wear the same amount of clothing each day. Compare today's weight to yesterday's weight.   DATE TIME WEIGHT COMMENT DATE TIME WEIGHT COMMENT                                                                                                                                                                                                                                                                           Low-Sodium Eating Plan Sodium, which is an element that makes up salt, helps you maintain a healthy balance of fluids in your body. Too much sodium can increase your blood pressure and cause fluid and waste to be held in your body. Your health care provider or dietitian may recommend following this plan if you have high blood pressure (hypertension), kidney disease, liver disease, or heart failure.  Eating less sodium can help lower your blood pressure, reduce swelling, and protect your heart, liver, and kidneys. What are tips for following this plan? General guidelines  Most people on this plan should limit their sodium intake to 1,500-2,000 mg (milligrams) of sodium each day. Reading food labels   The Nutrition Facts label lists the amount of sodium in one serving  of the food. If you eat more than one serving, you must multiply the listed amount of sodium by the number of servings.  Choose foods with less than 140 mg of sodium per serving.  Avoid foods with 300 mg of sodium or more per serving. Shopping  Look for lower-sodium products, often labeled as "low-sodium" or "no salt added."  Always check the sodium content even if foods are labeled as "unsalted" or "no salt added".  Buy fresh foods. ? Avoid canned foods and premade or frozen meals. ? Avoid canned, cured, or processed meats  Buy breads that have less than 80 mg of sodium per slice. Cooking  Eat more home-cooked food and less restaurant, buffet, and fast food.  Avoid adding salt when cooking. Use salt-free seasonings or herbs instead of table salt or sea salt. Check with your health care provider or pharmacist before using salt substitutes.  Cook with plant-based oils, such as canola, sunflower, or olive oil. Meal planning  When eating at a restaurant, ask that your food be prepared with less salt or no salt, if possible.  Avoid foods that contain MSG (monosodium glutamate). MSG is sometimes added to [place] food, bouillon, and some canned foods. What foods are recommended? The items listed may not be a complete list. Talk with your dietitian about what dietary choices are best for you. Grains Low-sodium cereals, including oats, puffed wheat and rice, and shredded wheat. Low-sodium crackers. Unsalted rice. Unsalted pasta. Low-sodium bread. Whole-grain breads and whole-grain pasta. Vegetables Fresh or frozen  vegetables. "No salt added" canned vegetables. "No salt added" tomato sauce and paste. Low-sodium or reduced-sodium tomato and vegetable juice. Fruits Fresh, frozen, or canned fruit. Fruit juice. Meats and other protein foods Fresh or frozen (no salt added) meat, poultry, seafood, and fish. Low-sodium canned tuna and salmon. Unsalted nuts. Dried peas, beans, and lentils without added salt. Unsalted canned beans. Eggs. Unsalted nut butters. Dairy Milk. Soy milk. Cheese that is naturally low in sodium, such as ricotta cheese, fresh mozzarella, or Swiss cheese Low-sodium or reduced-sodium cheese. Cream cheese. Yogurt. Fats and oils Unsalted butter. Unsalted margarine with no trans fat. Vegetable oils such as canola or olive oils. Seasonings and other foods Fresh and dried herbs and spices. Salt-free seasonings. Low-sodium mustard and ketchup. Sodium-free salad dressing. Sodium-free light mayonnaise. Fresh or refrigerated horseradish. Lemon juice. Vinegar. Homemade, reduced-sodium, or low-sodium soups. Unsalted popcorn and pretzels. Low-salt or salt-free chips. What foods are not recommended? The items listed may not be a complete list. Talk with your dietitian about what dietary choices are best for you. Grains Instant hot cereals. Bread stuffing, pancake, and biscuit mixes. Croutons. Seasoned rice or pasta mixes. Noodle soup cups. Boxed or frozen macaroni and cheese. Regular salted crackers. Self-rising flour. Vegetables Sauerkraut, pickled vegetables, and relishes. Olives. [place] fries. Onion rings. Regular canned vegetables (not low-sodium or reduced-sodium). Regular canned tomato sauce and paste (not low-sodium or reduced-sodium). Regular tomato and vegetable juice (not low-sodium or reduced-sodium). Frozen vegetables in sauces. Meats and other protein foods Meat or fish that is salted, canned, smoked, spiced, or pickled. Bacon, ham, sausage, hotdogs, corned beef, chipped beef, packaged lunch  meats, salt pork, jerky, pickled herring, anchovies, regular canned tuna, sardines, salted nuts. Dairy Processed cheese and cheese spreads. Cheese curds. Blue cheese. Feta cheese. String cheese. Regular cottage cheese. Buttermilk. Canned milk. Fats and oils Salted butter. Regular margarine. Ghee. Bacon fat. Seasonings and other foods Onion salt, garlic salt, seasoned salt, table salt, and sea salt. Canned  and packaged gravies. Worcestershire sauce. Tartar sauce. Barbecue sauce. Teriyaki sauce. Soy sauce, including reduced-sodium. Steak sauce. Fish sauce. Oyster sauce. Cocktail sauce. Horseradish that you find on the shelf. Regular ketchup and mustard. Meat flavorings and tenderizers. Bouillon cubes. Hot sauce and Tabasco sauce. Premade or packaged marinades. Premade or packaged taco seasonings. Relishes. Regular salad dressings. Salsa. Potato and tortilla chips. Corn chips and puffs. Salted popcorn and pretzels. Canned or dried soups. Pizza. Frozen entrees and pot pies. Summary  Eating less sodium can help lower your blood pressure, reduce swelling, and protect your heart, liver, and kidneys.  Most people on this plan should limit their sodium intake to 1,500-2,000 mg (milligrams) of sodium each day.  Canned, boxed, and frozen foods are high in sodium. Restaurant foods, fast foods, and pizza are also very high in sodium. You also get sodium by adding salt to food.  Try to cook at home, eat more fresh fruits and vegetables, and eat less fast food, canned, processed, or prepared foods. This information is not intended to replace advice given to you by your health care provider. Make sure you discuss any questions you have with your health care provider. Document Released: 06/25/2001 Document Revised: 12/28/2015 Document Reviewed: 12/28/2015 Elsevier Interactive Patient Education  2019 [employer].

## 2018-05-14 NOTE — Progress Notes (Signed)
Virtual Visit via Video Note   This visit type was conducted due to national recommendations for restrictions regarding the COVID-19 Pandemic (e.g. social distancing) in an effort to limit this patient's exposure and mitigate transmission in our community.  Due to her co-morbid illnesses, this patient is at least at moderate risk for complications without adequate follow up.  This format is felt to be most appropriate for this patient at this time.  All issues noted in this document were discussed and addressed.  A limited physical exam was performed with this format.  Please refer to the patient's chart for her consent to telehealth for [facility].   Evaluation Performed:  Follow-up visit  Date:  05/14/2018   ID:  Sabrina Holland, Sabrina Holland 06-15-1970, MRN [id number]  Patient Location: Home Provider Location: Home  PCP:  Gildardo Pounds, NP  Cardiologist:  Sanda Klein, MD  Electrophysiologist:  None   Chief Complaint: CHF exacerbation  History of Present Illness:    Sabrina Holland is a 48 y.o. female with severe left ventricular systolic dysfunction due to cardiomyopathy of uncertain etiology (history of hypertension, cocaine abuse, breast cancer therapy), COPD with ongoing tobacco use, history of TIA.  She is describing orthopnea and has not been able to sleep in 2 minutes.  She has dyspnea with minimal activity, but still wants to keep working at her job at [employer].  She reports being "clean" of cocaine for the last 2 months.  She is trying hard to eat a healthier diet, but has clear gaps in her knowledge regarding sodium content.  She has been eating a lot of cheese crackers and peanut butter sandwiches.  She believes she weighs at least 132 pounds (dry weight is not entirely clear but is definitely less than 128 pounds).  She does not have leg edema.  She denies chest tightness, fever, chills, bleeding problems, focal neurological complaints, palpitations, syncope or  intermittent claudication.She does have some abdominal fullness.  She has a chronic cough which she attributes to smoking.  She was evaluated in the emergency room on April 12 for sharp right-sided chest pain after a fall.  A right-sided nondisplaced rib fracture was documented on x-ray.  She was taking ibuprofen. Troponin was borderline at 0.04. At that visit she told the provider that she had last used cocaine 1 week earlier.  She told me today that she has been "clean for 2 months".  Her urine tox screen was positive for cocaine and THC in January and February 10, was not checked at the last ER visit.  Her most recent echocardiogram performed in November 2019 shows left ventricular ejection fraction of 25-30% due to diffuse hypokinesis.  Estimated PA pressure was 43 mmHg.  The patient does not have symptoms concerning for COVID-19 infection (fever, chills, cough, or new shortness of breath).    Past Medical History:  Diagnosis Date   Breast cancer ([place])    Breast cancer, stage 2 ([place]) 02/23/2011   COPD (chronic obstructive pulmonary disease) ([place])    Hypertension    TIA (transient ischemic attack) 11/25/2011   "first time" (11/25/2011)   Past Surgical History:  Procedure Laterality Date   BREAST BIOPSY  2007   left   MASTECTOMY  09/2005   left   TONSILLECTOMY AND ADENOIDECTOMY     "when I was little" (11/25/2011)   TUBAL LIGATION  1993     Current Meds  Medication Sig   albuterol (PROVENTIL) (2.5 MG/3ML) 0.083% nebulizer solution Take  3 mLs (2.5 mg total) by nebulization every 6 (six) hours as needed for wheezing or shortness of breath.   amLODipine (NORVASC) 5 MG tablet Take 1 tablet (5 mg total) by mouth daily.   aspirin EC 81 MG EC tablet Take 1 tablet (81 mg total) by mouth daily.   atorvastatin (LIPITOR) 40 MG tablet Take 1 tablet (40 mg total) by mouth daily at 6 PM.   budesonide-formoterol (SYMBICORT) 80-4.5 MCG/ACT inhaler Inhale 2 puffs into the lungs 2 (two)  times daily.   cyclobenzaprine (FLEXERIL) 10 MG tablet Take 1 tablet (10 mg total) by mouth 2 (two) times daily as needed for up to 10 doses for muscle spasms.   diclofenac sodium (VOLTAREN) 1 % GEL Apply 2 g topically 4 (four) times daily.   ferrous sulfate 325 (65 FE) MG tablet Take 1 tablet (325 mg total) by mouth 2 (two) times daily with a meal.   fluticasone (FLONASE) 50 MCG/ACT nasal spray Place 2 sprays into both nostrils daily.   furosemide (LASIX) 20 MG tablet Take 3 tablets (60 mg total) by mouth 2 (two) times daily for 30 days.   lidocaine (LIDODERM) 5 % Place 1 patch onto the skin daily. Remove & Discard patch within 12 hours or as directed by MD   lisinopril (PRINIVIL,ZESTRIL) 20 MG tablet Take 1 tablet (20 mg total) by mouth daily.   Multiple Vitamin (MULTIVITAMIN WITH MINERALS) TABS tablet Take 1 tablet by mouth daily.   nicotine (NICODERM CQ - DOSED IN MG/24 HOURS) 14 mg/24hr patch Place 1 patch (14 mg total) onto the skin daily.   omeprazole (PRILOSEC) 20 MG capsule Take 1 capsule (20 mg total) by mouth daily.   senna-docusate (SENOKOT-S) 8.6-50 MG tablet Take 1 tablet by mouth 2 (two) times daily as needed for mild constipation.   spironolactone (ALDACTONE) 25 MG tablet Take 1 tablet (25 mg total) by mouth daily.   tiotropium (SPIRIVA HANDIHALER) 18 MCG inhalation capsule Place 1 capsule (18 mcg total) into inhaler and inhale daily.     Allergies:   Penicillins   Social History   Tobacco Use   Smoking status: Current Some Day Smoker    Packs/day: 0.00    Years: 20.00    Pack years: 0.00    Types: Cigarettes   Smokeless tobacco: Never Used   Tobacco comment: 1 or 2 cigarettes a day- approx 1 pack week  Substance Use Topics   Alcohol use: Yes    Alcohol/week: 12.0 standard drinks    Types: 12 Cans of beer per week    Comment: occasionally   Drug use: Yes    Types: Marijuana, Cocaine    Comment: patient states she uses cocaine and marijuana daily      Family Hx: The patient's family history includes COPD in her father; Sickle cell anemia in her cousin.  ROS:   Please see the history of present illness.     All other systems reviewed and are negative.   Prior CV studies:   The following studies were reviewed today:    Labs/Other Tests and Data Reviewed:    EKG:  An ECG dated 04/29/2018 was personally reviewed today and demonstrated:  Sinus tachycardia, biatrial enlargement, QS pattern in leads V1-V2, no acute repolarization abnormalities  Recent Labs: 02/26/2018: TSH 0.485 04/16/2018: ALT 18; B Natriuretic Peptide 1,857.5 04/18/2018: Magnesium 1.6 04/29/2018: BUN 22; Creatinine, Ser 1.13; Hemoglobin 12.0; Platelets 416; Potassium 3.8; Sodium 136   Recent Lipid Panel Lab Results  Component Value  Date/Time   CHOL 185 06/21/2017 05:19 AM   TRIG 59 06/21/2017 05:19 AM   HDL 93 06/21/2017 05:19 AM   CHOLHDL 2.0 06/21/2017 05:19 AM   LDLCALC 80 06/21/2017 05:19 AM    Wt Readings from Last 3 Encounters:  05/14/18 132 lb (59.9 kg)  04/29/18 128 lb 3.2 oz (58.2 kg)  04/23/18 128 lb 3.2 oz (58.2 kg)     Objective:    Vital Signs:  Ht 5\' 5"  (1.651 m)    Wt 132 lb (59.9 kg)    LMP 02/18/2015 (Exact Date) Comment: MAYBE ONCE A YEAR   BMI 21.97 kg/m    VITAL SIGNS:  reviewed GEN:  no acute distress EYES:  sclerae anicteric, EOMI - Extraocular Movements Intact RESPIRATORY:  normal respiratory effort, symmetric expansion CARDIOVASCULAR:  no peripheral edema SKIN:  no rash, lesions or ulcers. MUSCULOSKELETAL:  no obvious deformities. NEURO:  alert and oriented x 3, no obvious focal deficit PSYCH:  normal affect  ASSESSMENT & PLAN:    1. CHF: She has clear-cut symptoms of left heart failure including orthopnea.  She is on 60 mg twice daily furosemide and has not noticed meaningful urine output increase.  We will add metolazone 2.5 mg once daily to be taken 30 minutes before 1 of her doses of furosemide.  She reports that this  would be most convenient right after she gets back from work at around 4:00 in the afternoon tomorrow.  We will touch base with her roughly 48 hours after that during our clinic on Thursday.  She has normal potassium level recently and is also taking spironolactone so we will not add additional potassium, but need to keep in mind the possibility of hypokalemia.  Made it very clear to her that this new medication, metolazone should be used sporadically for episodes of severe heart failure exacerbation, to avoid hospitalization.  It is not a daily medication.  Continue furosemide 60 mg daily 2. CMP: Etiology uncertain, stressed the importance of avoiding any toxic substances such as cocaine and alcohol.  Continue treatment with ACE inhibitor and spironolactone and loop diuretic.  Avoiding beta-blocker at this point due to history of cocaine use, acute decompensation and reactive airway disease.  Reevaluate appropriateness of beta-blocker therapy if she remains cocaine free and her bronchial problems are well controlled. 3. HTN: She does not have a blood pressure cuff available but her blood pressure was well controlled on her recent emergency room visit.  She does not currently have symptoms of hypotension. 4. COPD: Difficult to assess this with a video visit.  Receiving inhalers.  Avoid nonselective beta-blocker such as carvedilol.  At the same time, would prefer an agent that has alpha blocking properties.  Diastolic would be a good agent in her case, but will likely not be affordable.  Not ready for beta-blockers yet anyway. 5. Cocaine use/abuse: She told me today that she has been "clean" for 2 months, but in the ED on April 12 she reported using cocaine a week earlier.  I am not sure if this was simply a misunderstanding or if she is trying to sleep.  For the time being we will give her the benefit of the doubt.  Stressed again that cocaine use might be 1 of the major reasons for her cardiomyopathy and is  definitely likely to worsen her heart condition and leads to early death. 6. Social issues: She continues to have serious social issues related to a lack of appropriate shelter but at least  has a job at [employer].  Unfortunately the fluid that she eats there is not compatible with a low-sodium diet. 7. HLP: Last lipid profile was satisfactory.  COVID-19 Education: The signs and symptoms of COVID-19 were discussed with the patient and how to seek care for testing (follow up with PCP or arrange E-visit).  The importance of social distancing was discussed today.  Time:   Today, I have spent 21 minutes with the patient with telehealth technology discussing the above problems.     Medication Adjustments/Labs and Tests Ordered: Current medicines are reviewed at length with the patient today.  Concerns regarding medicines are outlined above.   Tests Ordered: No orders of the defined types were placed in this encounter.   Medication Changes: Meds ordered this encounter  Medications   metolazone (ZAROXOLYN) 2.5 MG tablet    Sig: Take ONLY as directed.    Dispense:  15 tablet    Refill:  3    Disposition:  Follow up 3 days  Signed, Sanda Klein, MD  05/14/2018 [facility]

## 2018-05-16 ENCOUNTER — Telehealth: Payer: Self-pay | Admitting: Cardiovascular Disease

## 2018-05-16 NOTE — Telephone Encounter (Signed)
Mychart pending, no smartphone, pre reg complete 05/16/18 AF

## 2018-05-17 ENCOUNTER — Telehealth: Payer: Self-pay | Admitting: Cardiovascular Disease

## 2018-05-17 MED FILL — ?OMEPRAZOLE 20 MG CAPSULE D: 20 | 30 days supply | Qty: 30 | Fill #1

## 2018-05-17 NOTE — Telephone Encounter (Signed)
Called pt and while briefly speaking was disconnected Called pt back and went to voice mail Left message for pt to call back ./cy

## 2018-05-17 NOTE — Telephone Encounter (Signed)
Agree w advice given MCr

## 2018-05-17 NOTE — Telephone Encounter (Signed)
F/U Message          Patient is returning Christine's call

## 2018-05-17 NOTE — Telephone Encounter (Signed)
  Patient is waiting for her daughter to pick up her lasix enhancer later today but she wants to know if there is anything else she can take to help her with her fluid since she is having some shortness breath. Can she take more of her lasix? She would also like advice on the low sodium diet.

## 2018-05-17 NOTE — Telephone Encounter (Signed)
Spoke with pt and pt has not started Metolazone as of yet.Per pt will try and get med today the patient is complaining of SOB and wanted to know if could increase Furosemide. Encouraged pt to continue with same  dosage and get Metolazone. Also reiterated on low sodium diet  Per pt has been eating canned vegetables instructed to try and rinse off and in future purchase frozen or fresh pt verbalizes understanding. Pt is going to get Metolazone today and will call back with update. Will forward to Dr Sallyanne Kuster for review .Adonis Housekeeper

## 2018-05-21 ENCOUNTER — Emergency Department (HOSPITAL_COMMUNITY)
Admission: EM | Admit: 2018-05-21 | Discharge: 2018-05-21 | Discharge status: Against Medical Advice | Payer: HRSA Program | Attending: Emergency Medicine | Admitting: Emergency Medicine

## 2018-05-21 ENCOUNTER — Emergency Department (HOSPITAL_COMMUNITY): Payer: HRSA Program

## 2018-05-21 ENCOUNTER — Other Ambulatory Visit: Payer: Self-pay

## 2018-05-21 ENCOUNTER — Encounter (HOSPITAL_COMMUNITY): Payer: Self-pay | Admitting: Emergency Medicine

## 2018-05-21 DIAGNOSIS — Z5329 Procedure and treatment not carried out because of patient's decision for other reasons: Secondary | ICD-10-CM | POA: Diagnosis not present

## 2018-05-21 DIAGNOSIS — I5023 Acute on chronic systolic (congestive) heart failure: Secondary | ICD-10-CM

## 2018-05-21 DIAGNOSIS — E782 Mixed hyperlipidemia: Secondary | ICD-10-CM | POA: Insufficient documentation

## 2018-05-21 DIAGNOSIS — Z79899 Other long term (current) drug therapy: Secondary | ICD-10-CM | POA: Diagnosis not present

## 2018-05-21 DIAGNOSIS — J449 Chronic obstructive pulmonary disease, unspecified: Secondary | ICD-10-CM | POA: Diagnosis not present

## 2018-05-21 DIAGNOSIS — F17210 Nicotine dependence, cigarettes, uncomplicated: Secondary | ICD-10-CM | POA: Diagnosis not present

## 2018-05-21 DIAGNOSIS — D649 Anemia, unspecified: Secondary | ICD-10-CM | POA: Diagnosis not present

## 2018-05-21 DIAGNOSIS — R000 Tachycardia, unspecified: Secondary | ICD-10-CM

## 2018-05-21 DIAGNOSIS — I5043 Acute on chronic combined systolic (congestive) and diastolic (congestive) heart failure: Secondary | ICD-10-CM | POA: Diagnosis not present

## 2018-05-21 DIAGNOSIS — I110 Hypertensive heart disease with heart failure: Secondary | ICD-10-CM | POA: Diagnosis not present

## 2018-05-21 DIAGNOSIS — Z853 Personal history of malignant neoplasm of breast: Secondary | ICD-10-CM | POA: Diagnosis not present

## 2018-05-21 DIAGNOSIS — Z8673 Personal history of transient ischemic attack (TIA), and cerebral infarction without residual deficits: Secondary | ICD-10-CM | POA: Insufficient documentation

## 2018-05-21 DIAGNOSIS — Z9012 Acquired absence of left breast and nipple: Secondary | ICD-10-CM | POA: Diagnosis not present

## 2018-05-21 DIAGNOSIS — Z20828 Contact with and (suspected) exposure to other viral communicable diseases: Secondary | ICD-10-CM | POA: Diagnosis not present

## 2018-05-21 DIAGNOSIS — R0600 Dyspnea, unspecified: Secondary | ICD-10-CM | POA: Diagnosis present

## 2018-05-21 DIAGNOSIS — R748 Abnormal levels of other serum enzymes: Secondary | ICD-10-CM | POA: Diagnosis not present

## 2018-05-21 DIAGNOSIS — Z9114 Patient's other noncompliance with medication regimen: Secondary | ICD-10-CM | POA: Insufficient documentation

## 2018-05-21 DIAGNOSIS — Z7951 Long term (current) use of inhaled steroids: Secondary | ICD-10-CM | POA: Diagnosis not present

## 2018-05-21 LAB — TROPONIN I: Troponin I: 0.27 ng/mL (ref <0.03)

## 2018-05-21 LAB — BRAIN NATRIURETIC PEPTIDE: B Natriuretic Peptide: 1580.9 pg/mL — ABNORMAL HIGH (ref 0.0–100.0)

## 2018-05-21 LAB — HEPATIC FUNCTION PANEL
ALT: 17 U/L (ref 0–44)
AST: 39 U/L (ref 15–41)
Albumin: 3.4 g/dL — ABNORMAL LOW (ref 3.5–5.0)
Alkaline Phosphatase: 99 U/L (ref 38–126)
Bilirubin, Direct: 0.8 mg/dL — ABNORMAL HIGH (ref 0.0–0.2)
Indirect Bilirubin: 0.9 mg/dL (ref 0.3–0.9)
Total Bilirubin: 1.7 mg/dL — ABNORMAL HIGH (ref 0.3–1.2)
Total Protein: 7.4 g/dL (ref 6.5–8.1)

## 2018-05-21 LAB — CBC
HCT: 36.6 % (ref 36.0–46.0)
Hemoglobin: 11.5 g/dL — ABNORMAL LOW (ref 12.0–15.0)
MCH: 22.5 pg — ABNORMAL LOW (ref 26.0–34.0)
MCHC: 31.4 g/dL (ref 30.0–36.0)
MCV: 71.8 fL — ABNORMAL LOW (ref 80.0–100.0)
Platelets: 437 10*3/uL — ABNORMAL HIGH (ref 150–400)
RBC: 5.1 MIL/uL (ref 3.87–5.11)
RDW: 21.1 % — ABNORMAL HIGH (ref 11.5–15.5)
WBC: 7.5 10*3/uL (ref 4.0–10.5)
nRBC: 0 % (ref 0.0–0.2)

## 2018-05-21 LAB — BASIC METABOLIC PANEL
Anion gap: 11 (ref 5–15)
BUN: 18 mg/dL (ref 6–20)
CO2: 32 mmol/L (ref 22–32)
Calcium: 9.5 mg/dL (ref 8.9–10.3)
Chloride: 93 mmol/L — ABNORMAL LOW (ref 98–111)
Creatinine, Ser: 1.1 mg/dL — ABNORMAL HIGH (ref 0.44–1.00)
GFR calc Af Amer: >60 mL/min (ref >60)
GFR calc non Af Amer: 59 mL/min — ABNORMAL LOW (ref >60)
Glucose, Bld: 93 mg/dL (ref 70–99)
Potassium: 3.6 mmol/L (ref 3.5–5.1)
Sodium: 136 mmol/L (ref 135–145)

## 2018-05-21 LAB — SARS CORONAVIRUS 2 BY RT PCR (HOSPITAL ORDER, PERFORMED IN ~~LOC~~ HOSPITAL LAB): SARS Coronavirus 2: NEGATIVE

## 2018-05-21 LAB — I-STAT BETA HCG BLOOD, ED (MC, WL, AP ONLY): I-stat hCG, quantitative: 5.7 m[IU]/mL — ABNORMAL HIGH (ref <5)

## 2018-05-21 LAB — LIPASE, BLOOD: Lipase: 54 U/L — ABNORMAL HIGH (ref 11–51)

## 2018-05-21 MED ORDER — FUROSEMIDE 10 MG/ML IJ SOLN
60.0000 mg | Freq: Once | INTRAMUSCULAR | Status: AC
  Administered 2018-05-21: 60 mg via INTRAVENOUS
  Filled 2018-05-21: qty 6

## 2018-05-21 MED ORDER — SODIUM CHLORIDE 0.9% FLUSH: 3.0000 mL | Freq: Once | INTRAVENOUS | Status: DC

## 2018-05-21 MED ORDER — POTASSIUM CHLORIDE CRYS ER 20 MEQ PO TBCR
40.0000 meq | EXTENDED_RELEASE_TABLET | Freq: Once | ORAL | Status: AC
  Administered 2018-05-21: 16:00:00 40 meq via ORAL
  Filled 2018-05-21: qty 2

## 2018-05-21 NOTE — ED Notes (Signed)
Date and time results received: 05/21/18 1547 (use smartphrase ".now" to insert current time)  Test: troponin  Critical Value: 0.27  Name of Provider Notified: sammy  Orders Received? Or Actions Taken?: .

## 2018-05-21 NOTE — ED Triage Notes (Signed)
Pt in with sob, worsened x 2 days. Has CHF, diuretic dose was increased last month and pt has noticed decreased urine output last 2 days. Feels very sob, denies any cough, fever or cp. States her abdomen is distended w/fluid. Recently dx with rib fx's last mo

## 2018-05-21 NOTE — ED Triage Notes (Signed)
Breath sounds clear through out, she reports taking a breathing treatment today. Does not want another at this time. Intermittently short of breath.

## 2018-05-21 NOTE — Discharge Instructions (Addendum)
Follow up with your doctor. Return to the ER for new or worsening symptoms.

## 2018-05-21 NOTE — ED Notes (Signed)
Pt refusing to sign, pt cussing at this RN during DC. Refused to let me take out IV. Pt mad because I will not be giving her a taxi voucher. States she wants to speak to someone in charge. I told her I am the charge nurse. Pt was advised by Dr Vanita Panda multiple times to be admitted but she refused because she needed to go... however she states she doesn't have a way home even though she needs to go.Marland KitchenMarland Kitchen

## 2018-05-21 NOTE — ED Provider Notes (Addendum)
[facility] EMERGENCY DEPARTMENT Provider Note   CSN: [id number] Arrival date & time: 05/21/18  1315  History   Chief Complaint Chief Complaint  Patient presents with  . Congestive Heart Failure  . Shortness of Breath    HPI Sabrina Holland is a 48 y.o. female with a hx of tobacco abuse, COPD, HTN, cocaine use, and CHF (last EF 25-30%) who presents to the ED with complaints of dyspnea that began in the middle of the night last night.  Patient notes she has a history of CHF, takes twice daily Lasix, she states that sometimes she misses doses of this.  She states that last night she forgot to take her Lasix, she woke up in the middle of the night short of breath.  She had to sleep sitting upright.  She has been persistently short of breath throughout the day with feeling of abdominal swelling/distention. Sxs are constant, worse with supine position and extended exertion, no alleviating factors.  States this feels similar to prior fluid overload.  She states that this does not feel similar to prior admissions.  She denies fever, chills, chest pain, lightheadedness, dizziness, syncope, leg pain/swelling, recent long travel, history of DVT/PE, hemoptysis, recent surgery, or hormone use.  She has a history of cocaine abuse, she states she is attempting to quit, she last used 1 week prior.    HPI  Past Medical History:  Diagnosis Date  . Breast cancer ([place])   . Breast cancer, stage 2 ([place]) 02/23/2011  . COPD (chronic obstructive pulmonary disease) ([place])   . Hypertension   . TIA (transient ischemic attack) 11/25/2011   "first time" (11/25/2011)    Patient Active Problem List   Diagnosis Date Noted  . Respiratory distress 04/18/2018  . Elevated LFTs 04/18/2018  . Acute CHF (congestive heart failure) ([place]) 04/16/2018  . CHF exacerbation ([place]) 04/16/2018  . Malnutrition of moderate degree 02/27/2018  . Dyspnea 02/26/2018  . Acute on chronic systolic CHF (congestive  heart failure) ([place]) 01/20/2018  . Acute on chronic systolic and diastolic heart failure, NYHA class 3 ([place]) 01/20/2018  . Cocaine abuse ([place]) 12/19/2017  . Mixed hyperlipidemia 12/19/2017  . COPD exacerbation ([place])   . Acute on chronic respiratory failure with hypoxemia ([place]) 12/03/2017  . Cardiomyopathy ([place]) 07/04/2017  . COPD (chronic obstructive pulmonary disease) ([place])   . Chronic combined systolic and diastolic heart failure ([place])   . Anemia 06/20/2017  . Polysubstance abuse ([place]) 11/26/2011  . TIA (transient ischemic attack) 11/25/2011  . Essential hypertension 11/25/2011  . Sinusitis 11/25/2011  . Vitamin B 12 deficiency 07/18/2011  . History of breast cancer 02/23/2011    Past Surgical History:  Procedure Laterality Date  . BREAST BIOPSY  2007   left  . MASTECTOMY  09/2005   left  . TONSILLECTOMY AND ADENOIDECTOMY     "when I was little" (11/25/2011)  . TUBAL LIGATION  1993     OB History    Gravida  5   Para  4   Term  4   Preterm  0   AB  1   Living        SAB  1   TAB  0   Ectopic  0   Multiple      Live Births               Home Medications    Prior to Admission medications   Medication Sig Start Date End Date Taking? Authorizing Provider  albuterol (PROVENTIL HFA) 108 (90 Base) MCG/ACT inhaler Inhale 1-2 puffs into the lungs every 6 (six) hours as needed for up to 1 day for wheezing or shortness of breath (cough). 03/05/18 04/17/18  Gildardo Pounds, NP  albuterol (PROVENTIL) (2.5 MG/3ML) 0.083% nebulizer solution Take 3 mLs (2.5 mg total) by nebulization every 6 (six) hours as needed for wheezing or shortness of breath. 03/05/18   Gildardo Pounds, NP  amLODipine (NORVASC) 5 MG tablet Take 1 tablet (5 mg total) by mouth daily. 03/05/18 06/03/18  Gildardo Pounds, NP  aspirin EC 81 MG EC tablet Take 1 tablet (81 mg total) by mouth daily. 06/24/17   Reyne Dumas, MD  atorvastatin (LIPITOR) 40 MG tablet Take 1 tablet (40 mg total) by mouth daily  at 6 PM. 03/05/18   Gildardo Pounds, NP  budesonide-formoterol (SYMBICORT) 80-4.5 MCG/ACT inhaler Inhale 2 puffs into the lungs 2 (two) times daily. 05/08/18   Gildardo Pounds, NP  cyclobenzaprine (FLEXERIL) 10 MG tablet Take 1 tablet (10 mg total) by mouth 2 (two) times daily as needed for up to 10 doses for muscle spasms. 04/02/18   Curatolo, Adam, DO  diclofenac sodium (VOLTAREN) 1 % GEL Apply 2 g topically 4 (four) times daily. 04/23/18   Gildardo Pounds, NP  ferrous sulfate 325 (65 FE) MG tablet Take 1 tablet (325 mg total) by mouth 2 (two) times daily with a meal. 04/18/18   Norval Morton, MD  fluticasone (FLONASE) 50 MCG/ACT nasal spray Place 2 sprays into both nostrils daily. 12/05/17   Eugenie Filler, MD  furosemide (LASIX) 20 MG tablet Take 3 tablets (60 mg total) by mouth 2 (two) times daily for 30 days. 04/23/18 05/23/18  Gildardo Pounds, NP  lidocaine (LIDODERM) 5 % Place 1 patch onto the skin daily. Remove & Discard patch within 12 hours or as directed by MD 04/29/18   Darlin Drop P, PA-C  lisinopril (PRINIVIL,ZESTRIL) 20 MG tablet Take 1 tablet (20 mg total) by mouth daily. 03/05/18 06/03/18  Gildardo Pounds, NP  metolazone (ZAROXOLYN) 2.5 MG tablet Take ONLY as directed. 05/14/18   Croitoru, Mihai, MD  Multiple Vitamin (MULTIVITAMIN WITH MINERALS) TABS tablet Take 1 tablet by mouth daily.    [provider]  nicotine (NICODERM CQ - DOSED IN MG/24 HOURS) 14 mg/24hr patch Place 1 patch (14 mg total) onto the skin daily. 03/05/18   Gildardo Pounds, NP  omeprazole (PRILOSEC) 20 MG capsule Take 1 capsule (20 mg total) by mouth daily. 03/05/18 06/03/18  Gildardo Pounds, NP  senna-docusate (SENOKOT-S) 8.6-50 MG tablet Take 1 tablet by mouth 2 (two) times daily as needed for mild constipation. 04/18/18   Norval Morton, MD  spironolactone (ALDACTONE) 25 MG tablet Take 1 tablet (25 mg total) by mouth daily. 04/23/18 07/22/18  Gildardo Pounds, NP  tiotropium (SPIRIVA HANDIHALER) 18 MCG  inhalation capsule Place 1 capsule (18 mcg total) into inhaler and inhale daily. 03/05/18 03/05/19  Gildardo Pounds, NP    Family History Family History  Problem Relation Age of Onset  . COPD Father   . Sickle cell anemia Cousin        maternal cousin    Social History Social History   Tobacco Use  . Smoking status: Current Some Day Smoker    Packs/day: 0.00    Years: 20.00    Pack years: 0.00    Types: Cigarettes  . Smokeless tobacco: Never Used  . Tobacco  comment: 1 or 2 cigarettes a day- approx 1 pack week  Substance Use Topics  . Alcohol use: Yes    Alcohol/week: 12.0 standard drinks    Types: 12 Cans of beer per week    Comment: occasionally  . Drug use: Yes    Types: Marijuana, Cocaine    Comment: patient states she uses cocaine and marijuana daily     Allergies   Penicillins   Review of Systems Review of Systems  Constitutional: Negative for chills, diaphoresis and fever.  Respiratory: Positive for shortness of breath. Negative for cough.   Cardiovascular: Negative for chest pain, palpitations and leg swelling.  Gastrointestinal: Positive for abdominal distention. Negative for blood in stool, constipation, diarrhea, nausea and vomiting.  Neurological: Negative for syncope, weakness and numbness.  All other systems reviewed and are negative.    Physical Exam Updated Vital Signs BP 109/80 (BP Location: Right Arm)   Pulse (!) 120   Temp (!) 97.3 F (36.3 C) (Oral)   Resp 18   LMP 02/18/2015 (Exact Date) Comment: MAYBE ONCE A YEAR  SpO2 100%   Physical Exam Vitals signs and nursing note reviewed.  Constitutional:      General: She is not in acute distress.    Appearance: She is well-developed. She is not toxic-appearing.  HENT:     Head: Normocephalic and atraumatic.  Eyes:     General:        Right eye: No discharge.        Left eye: No discharge.     Conjunctiva/sclera: Conjunctivae normal.  Neck:     Musculoskeletal: Neck supple.   Cardiovascular:     Rate and Rhythm: Regular rhythm. Tachycardia present.  Pulmonary:     Effort: Pulmonary effort is normal. No respiratory distress.     Breath sounds: Decreased breath sounds (throughout) present. No wheezing, rhonchi or rales.  Abdominal:     General: There is distension (mild).     Palpations: Abdomen is soft.     Tenderness: There is generalized abdominal tenderness (mild). There is no guarding or rebound.  Musculoskeletal:     Comments: Trace symmetric edema noted to the lower legs without overlying erythema or warmth.  No calf tenderness.  Skin:    General: Skin is warm and dry.     Findings: No rash.  Neurological:     Mental Status: She is alert.     Comments: Clear speech.   Psychiatric:        Behavior: Behavior normal.    ED Treatments / Results  Labs (all labs ordered are listed, but only abnormal results are displayed) Labs Reviewed  CBC - Abnormal; Notable for the following components:      Result Value   Hemoglobin 11.5 (*)    MCV 71.8 (*)    MCH 22.5 (*)    RDW 21.1 (*)    Platelets 437 (*)    All other components within normal limits  I-STAT BETA HCG BLOOD, ED ([facility], [facility], [facility] ONLY) - Abnormal; Notable for the following components:   I-stat hCG, quantitative 5.7 (*)    All other components within normal limits  BASIC METABOLIC PANEL    EKG EKG Interpretation  Date/Time:  Monday May 21 2018 15:00:39 EDT Ventricular Rate:  121 PR Interval:    QRS Duration: 81 QT Interval:  336 QTC Calculation: 477 R Axis:   -99 Text Interpretation:  Sinus tachycardia Consider right atrial enlargement Left anterior fascicular block T wave abnormality Abnormal  ekg Confirmed by Carmin Muskrat [phone]) on 05/21/2018 3:37:09 PM   Radiology Dg Chest 2 View  Result Date: 05/21/2018 CLINICAL DATA:  Shortness of breath. EXAM: CHEST - 2 VIEW COMPARISON:  Radiograph of May 29, 2018. FINDINGS: Stable cardiomegaly. Stable mild central pulmonary vascular congestion  is noted. No pneumothorax is noted. No significant pleural effusion is noted. Bony thorax is unremarkable. Minimal left lingular subsegmental atelectasis or scarring is noted. Right lung is clear. Bony thorax is unremarkable. IMPRESSION: Stable cardiomegaly with central pulmonary vascular congestion. Minimal left lingular subsegmental atelectasis or scarring. Electronically Signed   By: Marijo Conception M.D.   On: 05/21/2018 14:21    Procedures Procedures (including critical care time)  Medications Ordered in ED Medications  sodium chloride flush (NS) 0.9 % injection 3 mL (has no administration in time range)     Initial Impression / Assessment and Plan / ED Course  I have reviewed the triage vital signs and the nursing notes.  Pertinent labs & imaging results that were available during my care of the patient were reviewed by me and considered in my medical decision making (see chart for details).  Patient with history of CHF presents to the emergency department with dyspnea and abdominal swelling/distention that began in the middle of the night last night after missing her evening dose of Lasix.  Upon arrival she is tachycardic with decreased breath sounds as well as some mild abdominal swelling & trace bilateral lower extremity edema.  Suspect exacerbation of her underlying CHF secondary to noncompliance.  Additional considerations include PE, ACS, dissection, pneumonia, pneumothorax.  Plan for chest x-ray, EKG, and labs.  Work-up reviewed: CBC: Anemia consistent with prior ranges.  No leukocytosis.  Platelets are mildly elevated consistent with prior blood work. BMP: Mild elevation in creatinine, mild hypochloremia at 93, otherwise no electrolyte disturbance. Hepatic function panel: No significant abnormalities- similar to prior.  Lipase: Minimal elevation at 54. I-STAT beta-hCG: Minimally elevated at 5.7, suspect false positive, qualitative added on. BNP: Elevated at 1580.9. Troponin:  Elevated at 0.27 CXR:  Stable cardiomegaly with central pulmonary vascular congestion. Minimal left lingular subsegmental atelectasis or scarring.  EKG: sinus tach, no STEMI  Her presentation seems overall consistent with CHF exacerbation with secondary demand secondary to noncompliance.  Upon my initial assessment of the patient she was stating that she did not feel she needed admission at this time, she was planning to get fluid removed and go home.  I discussed findings and plan of care with supervising physician Dr. Lucia Bitter agreement that this is likely a CHF exacerbation with demand due to non compliance, less likely PE/ACS, given patient adamant about being discharge he recommends discussing options with the patient including option of diuresis, re-eval, & discharge if improved on re-eval w/ ability to maintain SpO2 on ambualtion as this is what patient initially desired, vs. Admission to the hospital. I am in agreement.    16:00: Patient care transitioned to Suella Broad, PA-C at change of shift will discuss disposition options with the patient, possibly reevaluate, and disposition appropriately.  Findings and plan of care discussed with supervising physician Dr. Vanita Panda who is in agreement.   Final Clinical Impressions(s) / ED Diagnoses   Final diagnoses:  None    ED Discharge Orders    None       Amaryllis Dyke, PA-C 05/21/18 1627  Kelly Ranieri Nigh was evaluated in Emergency Department on 05/21/18 for the symptoms described in the history of present illness. He/she was  evaluated in the context of the global COVID-19 pandemic, which necessitated consideration that the patient might be at risk for infection with the SARS-CoV-2 virus that causes COVID-19. Institutional protocols and algorithms that pertain to the evaluation of patients at risk for COVID-19 are in a state of rapid change based on information released by regulatory bodies including the CDC and federal  and state organizations. These policies and algorithms were followed during the patient's care in the ED.     Leafy Kindle 05/22/18 1005    Carmin Muskrat, MD 05/28/18 [phone]

## 2018-05-21 NOTE — ED Notes (Signed)
Pt provided with a sandwich and drink. Okayed by EDP, Vanita Panda.

## 2018-05-21 NOTE — ED Provider Notes (Addendum)
Assumed care of patient at change of shift, see prior note for complete H&P, briefly, 48yo female with history of CHF and cocaine abuse presents with complaint or SHOB after missing diuretic dose or intermittent use of her diuretics. Patient states she has been sleeping more than usual and this is why she has missed doses. Last reported use of cocaine was 3-4 days ago. Patient does not want to stay in the hospital, would like to be given diuretic and discharged but does not feel well enough at this time to go (currently awaiting IV diuretic).   Physical Exam  BP (!) 106/58   Pulse (!) 107   Temp (!) 97.3 F (36.3 C) (Oral)   Resp 17   LMP 02/18/2015 (Exact Date) Comment: MAYBE ONCE A YEAR  SpO2 100%   Physical Exam Tachycardic, otherwise respirations even and unlabored, O2 100% on room air.  Returned to room to help patient with inhaler instructions (unclear when to use her Symbicort and ventolin. Patient was trying to get to the commode and monitor leads had come off. RN to room to assist with monitor leads, new EKG obtained showing sinus tach with rate of 166, patient with new O2 requirement- 2L [place]. Denies chest pain.  Discussed with Dr. Vanita Panda, plan for admission for new O2 requirement with tachycardia.  1717hrs, patient HR continues at 160-170, no complaints otherwise, feels like the diuretic is helping and her breathing is improving. Patient refuses admission. Plan is to continue to monitor. With dc AMA at 1930hrs.  ED Course/Procedures   Clinical Course as of May 21 2014  Mon May 21, 2018  1833 Hx CHF, missed her meds lately and has become fluid overloaded. Had run of sinus tach to 170s, asymptomatic. She has refused admission. She would like to leave at 7:30 AMA.  +trop without chest pain   [KM]    Clinical Course User Index [KM] Alveria Apley, PA-C    Procedures  MDM  Patient has refused admission to the hospital.  She is requesting to be discharged at this time with plan to  follow-up with her PCP.  Advised patient return to the ER at any point time.       Tacy Learn, PA-C 05/21/18 1844    Tacy Learn, PA-C 05/21/18 2016    Carmin Muskrat, MD 05/21/18 Emmit Pomfret    Carmin Muskrat, MD 05/28/18 [phone]

## 2018-05-28 ENCOUNTER — Telehealth: Payer: Self-pay | Admitting: Cardiovascular Disease

## 2018-05-28 NOTE — Telephone Encounter (Signed)
Patient was unsure of why she was having this visit. Advised it is a follow up from her last visit with medicatin changes. Agreed and stated she will do the visit, no additional questions at this time. Reminded her that someone else will likely call her from my office to get her pre-registered and ready for virtual visit.

## 2018-05-28 NOTE — Telephone Encounter (Signed)
LVM to return call for video or phone visit.

## 2018-05-29 ENCOUNTER — Encounter: Payer: Self-pay | Admitting: Cardiovascular Disease

## 2018-05-29 ENCOUNTER — Telehealth (INDEPENDENT_AMBULATORY_CARE_PROVIDER_SITE_OTHER): Payer: Self-pay | Admitting: Cardiovascular Disease

## 2018-05-29 VITALS — Ht 65.0 in

## 2018-05-29 DIAGNOSIS — I5042 Chronic combined systolic (congestive) and diastolic (congestive) heart failure: Secondary | ICD-10-CM

## 2018-05-29 DIAGNOSIS — E782 Mixed hyperlipidemia: Secondary | ICD-10-CM

## 2018-05-29 DIAGNOSIS — F1410 Cocaine abuse, uncomplicated: Secondary | ICD-10-CM

## 2018-05-29 DIAGNOSIS — I5043 Acute on chronic combined systolic (congestive) and diastolic (congestive) heart failure: Secondary | ICD-10-CM

## 2018-05-29 DIAGNOSIS — J449 Chronic obstructive pulmonary disease, unspecified: Secondary | ICD-10-CM

## 2018-05-29 DIAGNOSIS — I10 Essential (primary) hypertension: Secondary | ICD-10-CM

## 2018-05-29 DIAGNOSIS — I5023 Acute on chronic systolic (congestive) heart failure: Secondary | ICD-10-CM

## 2018-05-29 DIAGNOSIS — I429 Cardiomyopathy, unspecified: Secondary | ICD-10-CM

## 2018-05-29 DIAGNOSIS — F1910 Other psychoactive substance abuse, uncomplicated: Secondary | ICD-10-CM

## 2018-05-29 NOTE — Patient Instructions (Signed)
Medication Instructions:  STOP taking Metolazone as currently scheduled.   START taking Metolazone ONLY when weight is 130lbs or higher.  Continue Furosemide 60 mg.  If you need a refill on your cardiac medications before your next appointment, please call your pharmacy.   Lab work: Your physician recommends that you return for lab work: BMET  If you have labs (blood work) drawn today and your tests are completely normal, you will receive your results only by: Marland Kitchen MyChart Message (if you have MyChart) OR . A paper copy in the mail If you have any lab test that is abnormal or we need to change your treatment, we will call you to review the results.  Follow-Up: At [facility], you and your health needs are our priority.  As part of our continuing mission to provide you with exceptional heart care, we have created designated Provider Care Teams.  These Care Teams include your primary Cardiologist (physician) and Advanced Practice Providers (APPs -  Physician Assistants and Nurse Practitioners) who all work together to provide you with the care you need, when you need it. . You have been scheduled for a virtual follow-up visit on Friday, 06/22/18 at 11:30 AM.  Any Other Special Instructions Will Be Listed Below (If Applicable). Daily Weight Record: It is important to weigh yourself daily. Keep your daily weight chart near your scale. Weigh yourself each morning at the same time. Weigh yourself without shoes, and try to wear the same amount of clothing each day. Compare today's weight to yesterday's weight.  Bring this form with you to your follow-up appointments.  DATE TIME WEIGHT COMMENT DATE TIME WEIGHT COMMENT

## 2018-05-29 NOTE — Progress Notes (Signed)
Virtual Visit via Video Note   This visit type was conducted due to national recommendations for restrictions regarding the COVID-19 Pandemic (e.g. social distancing) in an effort to limit this patient's exposure and mitigate transmission in our community.  Due to her co-morbid illnesses, this patient is at least at moderate risk for complications without adequate follow up.  This format is felt to be most appropriate for this patient at this time.  All issues noted in this document were discussed and addressed.  A limited physical exam was performed with this format.  Please refer to the patient's chart for her consent to telehealth for [facility].   Evaluation Performed:  Follow-up visit  Date:  05/29/2018   ID:  Sabrina, Holland 1970/10/31, MRN [id number]  Patient Location: Home Provider Location: Home  PCP:  Gildardo Pounds, NP  Cardiologist:  Sanda Klein, MD  Electrophysiologist:  None   Chief Complaint: CHF exacerbation  History of Present Illness:    Sabrina Holland is a 48 y.o. female with severe left ventricular systolic dysfunction due to cardiomyopathy of uncertain etiology (history of hypertension, cocaine abuse, breast cancer therapy), COPD with ongoing tobacco use, history of TIA.  She feels better than she did at her last appointment.  She is sleeping well and no longer has orthopnea.  Her cough has subsided.  She has less abdominal fullness.  She has not taken any inhalers in the last 3 days.  She feels occasionally dizzy.  She reports that her urine output has decreased.  She weighs 127 pounds.  She reports that she has not used cocaine or other recreational drugs since her last appointment.  She continues to be under stress related to family disputes, but is working at [employer].  Her urine tox screen was positive for cocaine and THC in January and February 10, was not checked at the last ER visit.  Her most recent echocardiogram performed in  November 2019 shows left ventricular ejection fraction of 25-30% due to diffuse hypokinesis.  Estimated PA pressure was 43 mmHg.  The patient does not have symptoms concerning for COVID-19 infection (fever, chills, cough, or new shortness of breath).    Past Medical History:  Diagnosis Date   Breast cancer ([place])    Breast cancer, stage 2 ([place]) 02/23/2011   COPD (chronic obstructive pulmonary disease) ([place])    Hypertension    TIA (transient ischemic attack) 11/25/2011   "first time" (11/25/2011)   Past Surgical History:  Procedure Laterality Date   BREAST BIOPSY  2007   left   MASTECTOMY  09/2005   left   TONSILLECTOMY AND ADENOIDECTOMY     "when I was little" (11/25/2011)   TUBAL LIGATION  1993     Current Meds  Medication Sig   albuterol (PROVENTIL HFA) 108 (90 Base) MCG/ACT inhaler Inhale 1-2 puffs into the lungs every 6 (six) hours as needed for up to 1 day for wheezing or shortness of breath (cough).   albuterol (PROVENTIL) (2.5 MG/3ML) 0.083% nebulizer solution Take 3 mLs (2.5 mg total) by nebulization every 6 (six) hours as needed for wheezing or shortness of breath.   amLODipine (NORVASC) 5 MG tablet Take 1 tablet (5 mg total) by mouth daily.   aspirin EC 81 MG EC tablet Take 1 tablet (81 mg total) by mouth daily.   atorvastatin (LIPITOR) 40 MG tablet Take 1 tablet (40 mg total) by mouth daily at 6 PM.   budesonide-formoterol (SYMBICORT) 80-4.5 MCG/ACT inhaler Inhale  2 puffs into the lungs 2 (two) times daily.   cyclobenzaprine (FLEXERIL) 10 MG tablet Take 1 tablet (10 mg total) by mouth 2 (two) times daily as needed for up to 10 doses for muscle spasms.   diclofenac sodium (VOLTAREN) 1 % GEL Apply 2 g topically 4 (four) times daily.   ferrous sulfate 325 (65 FE) MG tablet Take 1 tablet (325 mg total) by mouth 2 (two) times daily with a meal.   fluticasone (FLONASE) 50 MCG/ACT nasal spray Place 2 sprays into both nostrils daily.   furosemide (LASIX) 20 MG  tablet Take 3 tablets (60 mg total) by mouth 2 (two) times daily for 30 days.   lidocaine (LIDODERM) 5 % Place 1 patch onto the skin daily. Remove & Discard patch within 12 hours or as directed by MD   lisinopril (PRINIVIL,ZESTRIL) 20 MG tablet Take 1 tablet (20 mg total) by mouth daily.   metolazone (ZAROXOLYN) 2.5 MG tablet Take ONLY as directed.   Multiple Vitamin (MULTIVITAMIN WITH MINERALS) TABS tablet Take 1 tablet by mouth daily.   nicotine (NICODERM CQ - DOSED IN MG/24 HOURS) 14 mg/24hr patch Place 1 patch (14 mg total) onto the skin daily.   omeprazole (PRILOSEC) 20 MG capsule Take 1 capsule (20 mg total) by mouth daily.   senna-docusate (SENOKOT-S) 8.6-50 MG tablet Take 1 tablet by mouth 2 (two) times daily as needed for mild constipation.   spironolactone (ALDACTONE) 25 MG tablet Take 1 tablet (25 mg total) by mouth daily.   tiotropium (SPIRIVA HANDIHALER) 18 MCG inhalation capsule Place 1 capsule (18 mcg total) into inhaler and inhale daily.     Allergies:   Penicillins   Social History   Tobacco Use   Smoking status: Current Some Day Smoker    Packs/day: 0.00    Years: 20.00    Pack years: 0.00    Types: Cigarettes   Smokeless tobacco: Never Used   Tobacco comment: 1 or 2 cigarettes a day- approx 1 pack week  Substance Use Topics   Alcohol use: Yes    Alcohol/week: 12.0 standard drinks    Types: 12 Cans of beer per week    Comment: occasionally   Drug use: Yes    Types: Marijuana, Cocaine    Comment: patient states she uses cocaine and marijuana daily     Family Hx: The patient's family history includes COPD in her father; Sickle cell anemia in her cousin.  ROS:   Please see the history of present illness.     All other systems were reviewed and are negative   Prior CV studies:   The following studies were reviewed today:  Labs/Other Tests and Data Reviewed:    EKG:  An ECG dated April 29, 2018 was personally reviewed today and demonstrated:   Sinus tachycardia, biatrial enlargement, QS pattern in leads V1-V2, no acute repolarization abnormalities  Recent Labs: 02/26/2018: TSH 0.485 04/18/2018: Magnesium 1.6 05/21/2018: ALT 17; B Natriuretic Peptide 1,580.9; BUN 18; Creatinine, Ser 1.10; Hemoglobin 11.5; Platelets 437; Potassium 3.6; Sodium 136   Recent Lipid Panel Lab Results  Component Value Date/Time   CHOL 185 06/21/2017 05:19 AM   TRIG 59 06/21/2017 05:19 AM   HDL 93 06/21/2017 05:19 AM   CHOLHDL 2.0 06/21/2017 05:19 AM   LDLCALC 80 06/21/2017 05:19 AM    Wt Readings from Last 3 Encounters:  05/14/18 132 lb (59.9 kg)  04/29/18 128 lb 3.2 oz (58.2 kg)  04/23/18 128 lb 3.2 oz (58.2 kg)  Objective:    Vital Signs:  Ht 5\' 5"  (1.651 m)    LMP 02/18/2015 (Exact Date) Comment: MAYBE ONCE A YEAR   BMI 21.97 kg/m    VITAL SIGNS:  reviewed Unable to examine  ASSESSMENT & PLAN:    1. CHF: Improved heart failure symptoms, now down to what seems to be very dry weight.  She has been taking furosemide 60 mg daily and metolazone every other day.  Asked her to stop the metolazone and continue taking furosemide 60 mg daily.  She will have labs in a few days at her office visit with her primary care provider. She is also taking spironolactone.  Stressed how important is for her to keep checking her weight every morning and will take her metolazone only if her weight exceeds 130 pounds. 2. CMP: Etiology uncertain, stressed the importance of avoiding any toxic substances such as cocaine and alcohol.  Continue treatment with ACE inhibitor and spironolactone and loop diuretic.  I think we should continue avoiding beta-blockers until we are confident that her cocaine habit is a thing of the past.  She was using as recently as February.   3. HTN: She is developed occasional dizziness and might have some hypotension.  She does not have a blood pressure cuff.  We will see what her blood pressure is at her upcoming PCP appointment. 4. COPD: Her  need to use inhalers has diminished with better treatment of congestive heart failure. 5. Cocaine use/abuse: She told me at her previous appointment that she had been "clean" for 2 months, but in the ED on April 12 she reported using cocaine a week earlier.  I am not sure if this was simply a misunderstanding or she was trying to mislead [place].  For the time being we will give her the benefit of the doubt.  Stressed again that cocaine use might be 1 of the major reasons for her cardiomyopathy and is definitely likely to worsen her heart condition and leads to early death. 6. Social issues: She continues to have serious social issues related to a lack of appropriate shelter but at least has a job at [employer].  Unfortunately the fluid that she eats there is not compatible with a low-sodium diet.  Reinforced the importance of sodium restriction to avoid future heart failure decompensation 7. HLP: Last lipid profile was satisfactory.  COVID-19 Education: The signs and symptoms of COVID-19 were discussed with the patient and how to seek care for testing (follow up with PCP or arrange E-visit).  The importance of social distancing was discussed today.  Time:   Today, I have spent 16 minutes with the patient with telehealth technology discussing the above problems.     Medication Adjustments/Labs and Tests Ordered: Current medicines are reviewed at length with the patient today.  Concerns regarding medicines are outlined above.   Tests Ordered: No orders of the defined types were placed in this encounter.   Medication Changes: No orders of the defined types were placed in this encounter.   Disposition:  Follow up 3 days  Signed, Sanda Klein, MD  05/29/2018 11:26 AM    [place]

## 2018-06-04 ENCOUNTER — Ambulatory Visit: Payer: Self-pay | Admitting: Nurse Practitioner

## 2018-06-04 ENCOUNTER — Other Ambulatory Visit: Payer: Self-pay

## 2018-06-04 ENCOUNTER — Telehealth: Payer: Self-pay

## 2018-06-04 NOTE — Telephone Encounter (Signed)
CMA spoke to patient.  Patient is asking if PCP can write a note for her job due to her breathing she is having a hard time waking up, getting ready for work, and her breathing is causing a hard time to work.  She is asking if PCP can write her a note for restriction or limitations on duties.

## 2018-06-05 ENCOUNTER — Encounter: Payer: Self-pay | Admitting: Nurse Practitioner

## 2018-06-05 NOTE — Telephone Encounter (Signed)
Letter has been printed. She can pick up at the front office.

## 2018-06-05 NOTE — Telephone Encounter (Signed)
CMA attempt to reach patient to inform letter is ready for pick up. No answer and left a VM.

## 2018-06-06 ENCOUNTER — Other Ambulatory Visit: Payer: Self-pay

## 2018-06-06 ENCOUNTER — Telehealth: Payer: Self-pay

## 2018-06-06 NOTE — Telephone Encounter (Signed)
As per Abelino Derrick, [employer] [facility] [place], the patient's referral is now closed, they have been assisting her with medicaid questions.

## 2018-06-12 ENCOUNTER — Other Ambulatory Visit: Payer: Self-pay | Admitting: Nurse Practitioner

## 2018-06-12 DIAGNOSIS — I10 Essential (primary) hypertension: Secondary | ICD-10-CM

## 2018-06-13 ENCOUNTER — Other Ambulatory Visit: Payer: Self-pay

## 2018-06-14 ENCOUNTER — Telehealth: Payer: Self-pay

## 2018-06-14 ENCOUNTER — Telehealth: Payer: Self-pay | Admitting: Cardiology

## 2018-06-14 NOTE — Telephone Encounter (Signed)
Called patient to pre reg for 06/18/18 appt, she stated she has been experiencing SOB and would like for you to give her a call

## 2018-06-14 NOTE — Telephone Encounter (Signed)
Opened in error

## 2018-06-14 NOTE — Telephone Encounter (Signed)
Pt called saying she has been SOB but doesn't think she is retaining fluid.  Her weight is 130 lbs- the cut off for her taking metolazone (which she hasn't taken).  I suggested she take Metolazone Friday and Saturday to see if that helps her symptoms.  She has a f/u already scheduled for Monday.   Kerin Ransom PA-C 06/14/2018 2:55 PM

## 2018-06-18 ENCOUNTER — Telehealth: Payer: Self-pay

## 2018-06-18 ENCOUNTER — Encounter: Payer: Self-pay | Admitting: Cardiology

## 2018-06-18 ENCOUNTER — Other Ambulatory Visit: Payer: Self-pay

## 2018-06-18 ENCOUNTER — Telehealth (INDEPENDENT_AMBULATORY_CARE_PROVIDER_SITE_OTHER): Payer: Self-pay | Admitting: Cardiology

## 2018-06-18 DIAGNOSIS — J449 Chronic obstructive pulmonary disease, unspecified: Secondary | ICD-10-CM

## 2018-06-18 DIAGNOSIS — Z853 Personal history of malignant neoplasm of breast: Secondary | ICD-10-CM

## 2018-06-18 DIAGNOSIS — F1410 Cocaine abuse, uncomplicated: Secondary | ICD-10-CM

## 2018-06-18 DIAGNOSIS — I429 Cardiomyopathy, unspecified: Secondary | ICD-10-CM

## 2018-06-18 DIAGNOSIS — I10 Essential (primary) hypertension: Secondary | ICD-10-CM

## 2018-06-18 DIAGNOSIS — I5021 Acute systolic (congestive) heart failure: Secondary | ICD-10-CM

## 2018-06-18 MED ORDER — ALBUTEROL SULFATE HFA 108 (90 BASE) MCG/ACT IN AERS: 1.0000 | INHALATION_SPRAY | Freq: Four times a day (QID) | RESPIRATORY_TRACT | 1 refills | Status: DC | PRN

## 2018-06-18 MED ORDER — ALBUTEROL SULFATE (2.5 MG/3ML) 0.083% IN NEBU: 2.5000 mg | INHALATION_SOLUTION | Freq: Four times a day (QID) | RESPIRATORY_TRACT | 1 refills | Status: DC | PRN

## 2018-06-18 MED ORDER — METOLAZONE 2.5 MG PO TABS: ORAL_TABLET | ORAL | 1 refills | Status: DC

## 2018-06-18 NOTE — Progress Notes (Signed)
Virtual Visit via Telephone Note   This visit type was conducted due to national recommendations for restrictions regarding the COVID-19 Pandemic (e.g. social distancing) in an effort to limit this patient's exposure and mitigate transmission in our community.  Due to her co-morbid illnesses, this patient is at least at moderate risk for complications without adequate follow up.  This format is felt to be most appropriate for this patient at this time.  The patient did not have access to video technology/had technical difficulties with video requiring transitioning to audio format only (telephone).  All issues noted in this document were discussed and addressed.  No physical exam could be performed with this format.  Please refer to the patient's chart for her  consent to telehealth for [facility].   Date:  06/18/2018   ID:  Sabrina Holland, Sabrina Holland Sep 28, 1970, MRN [id number]  Patient Location: Home Provider Location: Home  PCP:  Gildardo Pounds, NP  Cardiologist:  Sanda Klein, MD  Electrophysiologist:  None   Evaluation Performed:  Follow-Up Visit  Chief Complaint:  SOB at night  History of Present Illness:    Sabrina Holland is a 48 y.o. female with history of cardiomyopathy with undetermined etiology, hypertension, prior breast cancer, COPD with ongoing tobacco abuse, and history of cocaine and substance abuse.  The patient recently contacted [place] for increasing weight and shortness of breath.  She had previously been on metolazone but had not been taking it.  Her weight was 130 pounds.  I suggested she take it for 3 days, over the past weekend, and then contact us today.  She tells me that her weight is down to 116 pounds.  She still has shortness of breath usually at night.  She also tells me she is out of all of her inhalers.  She also wanted to discuss disability as she does not feel she can continue to do her job.  The patient does not have symptoms concerning for  COVID-19 infection (fever, chills, cough, or new shortness of breath).    Past Medical History:  Diagnosis Date  . Breast cancer ([place])   . Breast cancer, stage 2 ([place]) 02/23/2011  . COPD (chronic obstructive pulmonary disease) ([place])   . Hypertension   . TIA (transient ischemic attack) 11/25/2011   "first time" (11/25/2011)   Past Surgical History:  Procedure Laterality Date  . BREAST BIOPSY  2007   left  . MASTECTOMY  09/2005   left  . TONSILLECTOMY AND ADENOIDECTOMY     "when I was little" (11/25/2011)  . TUBAL LIGATION  1993     Current Meds  Medication Sig  . albuterol (PROVENTIL) (2.5 MG/3ML) 0.083% nebulizer solution Take 3 mLs (2.5 mg total) by nebulization every 6 (six) hours as needed for wheezing or shortness of breath.  Marland Kitchen aspirin EC 81 MG EC tablet Take 1 tablet (81 mg total) by mouth daily.  Marland Kitchen atorvastatin (LIPITOR) 40 MG tablet Take 1 tablet (40 mg total) by mouth daily at 6 PM.  . budesonide-formoterol (SYMBICORT) 80-4.5 MCG/ACT inhaler Inhale 2 puffs into the lungs 2 (two) times daily.  . cyclobenzaprine (FLEXERIL) 10 MG tablet Take 1 tablet (10 mg total) by mouth 2 (two) times daily as needed for up to 10 doses for muscle spasms.  . diclofenac sodium (VOLTAREN) 1 % GEL Apply 2 g topically 4 (four) times daily.  . ferrous sulfate 325 (65 FE) MG tablet Take 1 tablet (325 mg total) by mouth 2 (two) times  daily with a meal.  . fluticasone (FLONASE) 50 MCG/ACT nasal spray Place 2 sprays into both nostrils daily.  Marland Kitchen lidocaine (LIDODERM) 5 % Place 1 patch onto the skin daily. Remove & Discard patch within 12 hours or as directed by MD  . metolazone (ZAROXOLYN) 2.5 MG tablet Take ONLY as directed.  . Multiple Vitamin (MULTIVITAMIN WITH MINERALS) TABS tablet Take 1 tablet by mouth daily.  . nicotine (NICODERM CQ - DOSED IN MG/24 HOURS) 14 mg/24hr patch Place 1 patch (14 mg total) onto the skin daily.  Marland Kitchen senna-docusate (SENOKOT-S) 8.6-50 MG tablet Take 1 tablet by mouth 2 (two)  times daily as needed for mild constipation.  Marland Kitchen spironolactone (ALDACTONE) 25 MG tablet Take 1 tablet (25 mg total) by mouth daily.  Marland Kitchen tiotropium (SPIRIVA HANDIHALER) 18 MCG inhalation capsule Place 1 capsule (18 mcg total) into inhaler and inhale daily.     Allergies:   Penicillins   Social History   Tobacco Use  . Smoking status: Current Some Day Smoker    Packs/day: 0.00    Years: 20.00    Pack years: 0.00    Types: Cigarettes  . Smokeless tobacco: Never Used  . Tobacco comment: 1 or 2 cigarettes a day- approx 1 pack week  Substance Use Topics  . Alcohol use: Yes    Alcohol/week: 12.0 standard drinks    Types: 12 Cans of beer per week    Comment: occasionally  . Drug use: Yes    Types: Marijuana, Cocaine    Comment: patient states she uses cocaine and marijuana daily     Family Hx: The patient's family history includes COPD in her father; Sickle cell anemia in her cousin.  ROS:   Please see the history of present illness.    All other systems reviewed and are negative.   Prior CV studies:   The following studies were reviewed today: Echo Nov 2019- EF 25-30%  Labs/Other Tests and Data Reviewed:    EKG:  No ECG reviewed.  Recent Labs: 02/26/2018: TSH 0.485 04/18/2018: Magnesium 1.6 05/21/2018: ALT 17; B Natriuretic Peptide 1,580.9; BUN 18; Creatinine, Ser 1.10; Hemoglobin 11.5; Platelets 437; Potassium 3.6; Sodium 136   Recent Lipid Panel Lab Results  Component Value Date/Time   CHOL 185 06/21/2017 05:19 AM   TRIG 59 06/21/2017 05:19 AM   HDL 93 06/21/2017 05:19 AM   CHOLHDL 2.0 06/21/2017 05:19 AM   LDLCALC 80 06/21/2017 05:19 AM    Wt Readings from Last 3 Encounters:  05/14/18 132 lb (59.9 kg)  04/29/18 128 lb 3.2 oz (58.2 kg)  04/23/18 128 lb 3.2 oz (58.2 kg)     Objective:    Vital Signs:  LMP 02/18/2015 (Exact Date) Comment: MAYBE ONCE A YEAR   VITAL SIGNS:  reviewed  ASSESSMENT & PLAN:    Acute on chronic ombined systolic and diastolic CHF  She dropped from 130 lbs to 116 lbs with th addition of Zaroxolyn x 3 days.  I suggested she cut this back to every Wednesday and Sat.  I will check with her in two weeks, if she gains weight again or is still symptomatic I suspect she will need admission for further evaluation.   COPD- Smoker, out of her inhalers, will refill.  History of breast cancer S/P mastectomy followed by chemo 2008  Cardiomyopathy ([place]) New LVD on admission June 2019- etiology not yet determined  HTN (hypertension) HTN with grade 3 DD on echo  Polysubstance abuse ([place]) Denies recent use  COVID-19  Education: The signs and symptoms of COVID-19 were discussed with the patient and how to seek care for testing (follow up with PCP or arrange E-visit).  The importance of social distancing was discussed today.  Time:   Today, I have spent 15 minutes with the patient with telehealth technology discussing the above problems.     Medication Adjustments/Labs and Tests Ordered: Current medicines are reviewed at length with the patient today.  Concerns regarding medicines are outlined above.   Tests Ordered: No orders of the defined types were placed in this encounter.   Medication Changes: No orders of the defined types were placed in this encounter.   Disposition:  Follow up Metolazone 2.5 mg Wed and Sat.  Refill inhlaers. F/U in two weeks.  Signed, Kerin Ransom, PA-C  06/18/2018 4:32 PM    [facility]

## 2018-06-18 NOTE — Telephone Encounter (Signed)
Contacted patient to discuss AVS instructions. Patient agreeable with Luke's recommendations and voiced understanding. AVS will be mailed to patient by medical records. 

## 2018-06-18 NOTE — Progress Notes (Signed)
Ty!

## 2018-06-18 NOTE — Patient Instructions (Signed)
Medication Instructions:  CHANGE Metolazone to 1 tablet on Wednesday and Saturdays  If you need a refill on your cardiac medications before your next appointment, please call your pharmacy.   Lab work: None  If you have labs (blood work) drawn today and your tests are completely normal, you will receive your results only by: Marland Kitchen MyChart Message (if you have MyChart) OR . A paper copy in the mail If you have any lab test that is abnormal or we need to change your treatment, we will call you to review the results.  Testing/Procedures: None   Follow-Up: At [facility], you and your health needs are our priority.  As part of our continuing mission to provide you with exceptional heart care, we have created designated Provider Care Teams.  These Care Teams include your primary Cardiologist (physician) and Advanced Practice Providers (APPs -  Physician Assistants and Nurse Practitioners) who all work together to provide you with the care you need, when you need it.  . Your physician recommends that you schedule a follow-up appointment in: 2 weeks with Kerin Ransom, PA-C  Any Other Special Instructions Will Be Listed Below (If Applicable).

## 2018-06-19 MED FILL — ALBUTEROL 0.083% INHAL SOLN: (2.5 MG/3ML | 30 days supply | Qty: 150 | Fill #0

## 2018-06-19 MED FILL — !PROVENTIL HFA 90 MCG INH: 108 (90 BAS | 25 days supply | Qty: 18 | Fill #0

## 2018-06-20 MED FILL — SYMBICORT 80-4.5 MCG INH: 80-4.5 | 30 days supply | Qty: 10 | Fill #1

## 2018-06-20 MED FILL — SPIRIVA 18 MCG CP-HANDIHALE: 18 | 30 days supply | Qty: 30 | Fill #1

## 2018-06-22 ENCOUNTER — Telehealth: Payer: Self-pay | Admitting: Physician Assistant

## 2018-06-25 NOTE — Telephone Encounter (Signed)
call [phone] consent/ my chart declined/ pre reg completed

## 2018-07-02 ENCOUNTER — Telehealth: Payer: Self-pay

## 2018-07-02 ENCOUNTER — Telehealth: Payer: Self-pay | Admitting: Licensed Clinical Social Worker

## 2018-07-02 ENCOUNTER — Encounter: Payer: Self-pay | Admitting: Cardiology

## 2018-07-02 ENCOUNTER — Telehealth (INDEPENDENT_AMBULATORY_CARE_PROVIDER_SITE_OTHER): Payer: Self-pay | Admitting: Cardiology

## 2018-07-02 VITALS — BP 127/81 | Ht 65.0 in | Wt 127.0 lb

## 2018-07-02 DIAGNOSIS — J449 Chronic obstructive pulmonary disease, unspecified: Secondary | ICD-10-CM

## 2018-07-02 DIAGNOSIS — I429 Cardiomyopathy, unspecified: Secondary | ICD-10-CM

## 2018-07-02 DIAGNOSIS — I10 Essential (primary) hypertension: Secondary | ICD-10-CM

## 2018-07-02 DIAGNOSIS — F1910 Other psychoactive substance abuse, uncomplicated: Secondary | ICD-10-CM

## 2018-07-02 DIAGNOSIS — I5023 Acute on chronic systolic (congestive) heart failure: Secondary | ICD-10-CM

## 2018-07-02 DIAGNOSIS — Z853 Personal history of malignant neoplasm of breast: Secondary | ICD-10-CM

## 2018-07-02 NOTE — Telephone Encounter (Signed)
CSW attempted to reach patient via phone although unable to leave a message. CSW will attempt again tomorrow. Raquel Sarna, [place], [place]

## 2018-07-02 NOTE — Telephone Encounter (Signed)
Contacted patient to discuss AVS Instructions. Gave her Luke's recommendationsfrom today's virtual visit. She voiced understandingandAVS printed and will be handed to patient to Thursday office visit.

## 2018-07-02 NOTE — Progress Notes (Signed)
Virtual Visit via Telephone Note   This visit type was conducted due to national recommendations for restrictions regarding the COVID-19 Pandemic (e.g. social distancing) in an effort to limit this patient's exposure and mitigate transmission in our community.  Due to her co-morbid illnesses, this patient is at least at moderate risk for complications without adequate follow up.  This format is felt to be most appropriate for this patient at this time.  The patient did not have access to video technology/had technical difficulties with video requiring transitioning to audio format only (telephone).  All issues noted in this document were discussed and addressed.  No physical exam could be performed with this format.  Please refer to the patient's chart for her  consent to telehealth for [facility].   Date:  07/02/2018   ID:  Sabrina, Holland [birth year], MRN [id number]  Patient Location: Home Provider Location: Office  PCP:  Gildardo Pounds, NP  Cardiologist:  Sanda Klein, MD  Electrophysiologist:  None   Evaluation Performed:  Follow-Up Visit  Chief Complaint:  SOB  History of Present Illness:    Sabrina Holland is a 48 y.o. female with a history of cardiomyopathy with undetermined etiology, hypertension, prior breast cancer, COPD with ongoing tobacco abuse, and history of cocaine and substance abuse. Her toxicology screen was positive in Nov '19, and Jan and Feb 2020. She has had problems with SOB and weight gain.  She wasn't taking her Zaroxolyn correctly.  I adjusted this a few weeks ago with some improvement.  She was contacted today for follow up.   Since we last spoke she complains of continued SOB.  She gest some relief with her inhaler.  She is out of some of her medications, she can't tell me which ones as she was not at home.  She asked again about getting a letter for disability.     The patient does not have symptoms concerning for COVID-19  infection (fever, chills, cough, or new shortness of breath).    Past Medical History:  Diagnosis Date   Breast cancer ([place])    Breast cancer, stage 2 ([place]) 02/23/2011   COPD (chronic obstructive pulmonary disease) ([place])    Hypertension    TIA (transient ischemic attack) 11/25/2011   "first time" (11/25/2011)   Past Surgical History:  Procedure Laterality Date   BREAST BIOPSY  2007   left   MASTECTOMY  09/2005   left   TONSILLECTOMY AND ADENOIDECTOMY     "when I was little" (11/25/2011)   TUBAL LIGATION  1993     Current Meds  Medication Sig   albuterol (PROVENTIL HFA) 108 (90 Base) MCG/ACT inhaler Inhale 1-2 puffs into the lungs every 6 (six) hours as needed for up to 1 day for wheezing or shortness of breath (cough).   albuterol (PROVENTIL) (2.5 MG/3ML) 0.083% nebulizer solution Take 3 mLs (2.5 mg total) by nebulization every 6 (six) hours as needed for wheezing or shortness of breath.   amLODipine (NORVASC) 5 MG tablet Take 1 tablet (5 mg total) by mouth daily.   aspirin EC 81 MG EC tablet Take 1 tablet (81 mg total) by mouth daily.   atorvastatin (LIPITOR) 40 MG tablet Take 1 tablet (40 mg total) by mouth daily at 6 PM.   budesonide-formoterol (SYMBICORT) 80-4.5 MCG/ACT inhaler Inhale 2 puffs into the lungs 2 (two) times daily.   cyclobenzaprine (FLEXERIL) 10 MG tablet Take 1 tablet (10 mg total) by mouth 2 (two) times daily  as needed for up to 10 doses for muscle spasms.   diclofenac sodium (VOLTAREN) 1 % GEL Apply 2 g topically 4 (four) times daily.   ferrous sulfate 325 (65 FE) MG tablet Take 1 tablet (325 mg total) by mouth 2 (two) times daily with a meal.   fluticasone (FLONASE) 50 MCG/ACT nasal spray Place 2 sprays into both nostrils daily.   lidocaine (LIDODERM) 5 % Place 1 patch onto the skin daily. Remove & Discard patch within 12 hours or as directed by MD   metolazone (ZAROXOLYN) 2.5 MG tablet Take 1 tablet on Wednesday and Saturdays   Multiple  Vitamin (MULTIVITAMIN WITH MINERALS) TABS tablet Take 1 tablet by mouth daily.   nicotine (NICODERM CQ - DOSED IN MG/24 HOURS) 14 mg/24hr patch Place 1 patch (14 mg total) onto the skin daily.   omeprazole (PRILOSEC) 20 MG capsule Take 1 capsule (20 mg total) by mouth daily.   senna-docusate (SENOKOT-S) 8.6-50 MG tablet Take 1 tablet by mouth 2 (two) times daily as needed for mild constipation.   spironolactone (ALDACTONE) 25 MG tablet Take 1 tablet (25 mg total) by mouth daily.   tiotropium (SPIRIVA HANDIHALER) 18 MCG inhalation capsule Place 1 capsule (18 mcg total) into inhaler and inhale daily.     Allergies:   Penicillins   Social History   Tobacco Use   Smoking status: Current Some Day Smoker    Packs/day: 0.00    Years: 20.00    Pack years: 0.00    Types: Cigarettes   Smokeless tobacco: Never Used   Tobacco comment: 1 or 2 cigarettes a day- approx 1 pack week  Substance Use Topics   Alcohol use: Yes    Alcohol/week: 12.0 standard drinks    Types: 12 Cans of beer per week    Comment: occasionally   Drug use: Yes    Types: Marijuana, Cocaine    Comment: patient states she uses cocaine and marijuana daily     Family Hx: The patient's family history includes COPD in her father; Sickle cell anemia in her cousin.  ROS:   Please see the history of present illness.    All other systems reviewed and are negative.   Prior CV studies:   The following studies were reviewed today:   Labs/Other Tests and Data Reviewed:    EKG:  No ECG reviewed.  Recent Labs: 02/26/2018: TSH 0.485 04/18/2018: Magnesium 1.6 05/21/2018: ALT 17; B Natriuretic Peptide 1,580.9; BUN 18; Creatinine, Ser 1.10; Hemoglobin 11.5; Platelets 437; Potassium 3.6; Sodium 136   Recent Lipid Panel Lab Results  Component Value Date/Time   CHOL 185 06/21/2017 05:19 AM   TRIG 59 06/21/2017 05:19 AM   HDL 93 06/21/2017 05:19 AM   CHOLHDL 2.0 06/21/2017 05:19 AM   LDLCALC 80 06/21/2017 05:19 AM     Wt Readings from Last 3 Encounters:  07/02/18 127 lb (57.6 kg)  05/14/18 132 lb (59.9 kg)  04/29/18 128 lb 3.2 oz (58.2 kg)     Objective:    Vital Signs:  BP 127/81    Ht 5\' 5"  (1.651 m)    Wt 127 lb (57.6 kg)    LMP 02/18/2015 (Exact Date) Comment: MAYBE ONCE A YEAR   BMI 21.13 kg/m    VITAL SIGNS:  reviewed  ASSESSMENT & PLAN:    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  I suggested she cut this  back to every Wednesday and Sat.  I will check with her in two weeks, if she gains weight again or is still symptomatic I suspect she will need admission for further evaluation.   COPD- Smoker, out of her inhalers, will refill.  History of breast cancer S/P mastectomy followed by chemo 2008  Cardiomyopathy ([place]) New LVD on admission June 2019- etiology not yet determined  HTN (hypertension) HTN with grade 3 DD on echo  Polysubstance abuse ([place]) Denies recent use  COVID-19 Education: The signs and symptoms of COVID-19 were discussed with the patient and how to seek care for testing (follow up with PCP or arrange E-visit).  The importance of social distancing was discussed today.  Time:   Today, I have spent 15 minutes with the patient with telehealth technology discussing the above problems.     Medication Adjustments/Labs and Tests Ordered: Current medicines are reviewed at length with the patient today.  Concerns regarding medicines are outlined above.   Tests Ordered: No orders of the defined types were placed in this encounter.   Medication Changes: No orders of the defined types were placed in this encounter.   Follow Up:  In Person She needs an office visit- she is coming in for labs Thursday- will see if she can be added on to a provider's schedule.   Signed, Kerin Ransom, PA-C  07/02/2018 8:36 AM    [facility]

## 2018-07-02 NOTE — Patient Instructions (Signed)
Medication Instructions:  Your physician recommends that you continue on your current medications as directed. Please refer to the Current Medication list given to you today. If you need a refill on your cardiac medications before your next appointment, please call your pharmacy.   Lab work: None  If you have labs (blood work) drawn today and your tests are completely normal, you will receive your results only by: Marland Kitchen MyChart Message (if you have MyChart) OR . A paper copy in the mail If you have any lab test that is abnormal or we need to change your treatment, we will call you to review the results.  Testing/Procedures: None   Follow-Up: At [facility], you and your health needs are our priority.  As part of our continuing mission to provide you with exceptional heart care, we have created designated Provider Care Teams.  These Care Teams include your primary Cardiologist (physician) and Advanced Practice Providers (APPs -  Physician Assistants and Nurse Practitioners) who all work together to provide you with the care you need, when you need it. . Follow up Thursday with in office APP or Doctor in the Office (DOD)   Any Other Special Instructions Will Be Listed Below (If Applicable).

## 2018-07-03 ENCOUNTER — Encounter (HOSPITAL_COMMUNITY): Payer: Self-pay

## 2018-07-03 ENCOUNTER — Other Ambulatory Visit: Payer: Self-pay

## 2018-07-03 ENCOUNTER — Inpatient Hospital Stay (HOSPITAL_COMMUNITY)
Admission: EM | Admit: 2018-07-03 | Discharge: 2018-07-12 | DRG: 286 | Disposition: A | Discharge status: Home / Self Care | Payer: Medicaid Other | Attending: Internal Medicine | Admitting: Internal Medicine

## 2018-07-03 ENCOUNTER — Emergency Department (HOSPITAL_COMMUNITY): Payer: Medicaid Other

## 2018-07-03 DIAGNOSIS — I509 Heart failure, unspecified: Secondary | ICD-10-CM

## 2018-07-03 DIAGNOSIS — N182 Chronic kidney disease, stage 2 (mild): Secondary | ICD-10-CM | POA: Diagnosis present

## 2018-07-03 DIAGNOSIS — Z9114 Patient's other noncompliance with medication regimen: Secondary | ICD-10-CM

## 2018-07-03 DIAGNOSIS — R64 Cachexia: Secondary | ICD-10-CM | POA: Diagnosis present

## 2018-07-03 DIAGNOSIS — I4891 Unspecified atrial fibrillation: Secondary | ICD-10-CM | POA: Diagnosis present

## 2018-07-03 DIAGNOSIS — R34 Anuria and oliguria: Secondary | ICD-10-CM | POA: Diagnosis not present

## 2018-07-03 DIAGNOSIS — J209 Acute bronchitis, unspecified: Secondary | ICD-10-CM | POA: Diagnosis not present

## 2018-07-03 DIAGNOSIS — I255 Ischemic cardiomyopathy: Secondary | ICD-10-CM | POA: Diagnosis present

## 2018-07-03 DIAGNOSIS — Z515 Encounter for palliative care: Secondary | ICD-10-CM

## 2018-07-03 DIAGNOSIS — Z560 Unemployment, unspecified: Secondary | ICD-10-CM

## 2018-07-03 DIAGNOSIS — K7200 Acute and subacute hepatic failure without coma: Secondary | ICD-10-CM | POA: Diagnosis not present

## 2018-07-03 DIAGNOSIS — F1410 Cocaine abuse, uncomplicated: Secondary | ICD-10-CM | POA: Diagnosis present

## 2018-07-03 DIAGNOSIS — R58 Hemorrhage, not elsewhere classified: Secondary | ICD-10-CM | POA: Diagnosis not present

## 2018-07-03 DIAGNOSIS — R188 Other ascites: Secondary | ICD-10-CM | POA: Diagnosis present

## 2018-07-03 DIAGNOSIS — F17210 Nicotine dependence, cigarettes, uncomplicated: Secondary | ICD-10-CM | POA: Diagnosis present

## 2018-07-03 DIAGNOSIS — E46 Unspecified protein-calorie malnutrition: Secondary | ICD-10-CM | POA: Diagnosis present

## 2018-07-03 DIAGNOSIS — J440 Chronic obstructive pulmonary disease with acute lower respiratory infection: Secondary | ICD-10-CM | POA: Diagnosis present

## 2018-07-03 DIAGNOSIS — X58XXXA Exposure to other specified factors, initial encounter: Secondary | ICD-10-CM | POA: Diagnosis not present

## 2018-07-03 DIAGNOSIS — R319 Hematuria, unspecified: Secondary | ICD-10-CM | POA: Diagnosis not present

## 2018-07-03 DIAGNOSIS — F1910 Other psychoactive substance abuse, uncomplicated: Secondary | ICD-10-CM | POA: Diagnosis present

## 2018-07-03 DIAGNOSIS — Z20828 Contact with and (suspected) exposure to other viral communicable diseases: Secondary | ICD-10-CM | POA: Diagnosis not present

## 2018-07-03 DIAGNOSIS — E872 Acidosis: Secondary | ICD-10-CM | POA: Diagnosis not present

## 2018-07-03 DIAGNOSIS — Z9119 Patient's noncompliance with other medical treatment and regimen: Secondary | ICD-10-CM

## 2018-07-03 DIAGNOSIS — R9431 Abnormal electrocardiogram [ECG] [EKG]: Secondary | ICD-10-CM | POA: Diagnosis present

## 2018-07-03 DIAGNOSIS — E871 Hypo-osmolality and hyponatremia: Secondary | ICD-10-CM | POA: Diagnosis present

## 2018-07-03 DIAGNOSIS — Z9221 Personal history of antineoplastic chemotherapy: Secondary | ICD-10-CM

## 2018-07-03 DIAGNOSIS — J189 Pneumonia, unspecified organism: Secondary | ICD-10-CM | POA: Diagnosis not present

## 2018-07-03 DIAGNOSIS — I4721 Torsades de pointes: Secondary | ICD-10-CM

## 2018-07-03 DIAGNOSIS — I5043 Acute on chronic combined systolic (congestive) and diastolic (congestive) heart failure: Secondary | ICD-10-CM | POA: Diagnosis not present

## 2018-07-03 DIAGNOSIS — I483 Typical atrial flutter: Secondary | ICD-10-CM

## 2018-07-03 DIAGNOSIS — F1210 Cannabis abuse, uncomplicated: Secondary | ICD-10-CM | POA: Diagnosis present

## 2018-07-03 DIAGNOSIS — J9601 Acute respiratory failure with hypoxia: Secondary | ICD-10-CM | POA: Diagnosis not present

## 2018-07-03 DIAGNOSIS — I4901 Ventricular fibrillation: Secondary | ICD-10-CM | POA: Diagnosis not present

## 2018-07-03 DIAGNOSIS — Z825 Family history of asthma and other chronic lower respiratory diseases: Secondary | ICD-10-CM

## 2018-07-03 DIAGNOSIS — N179 Acute kidney failure, unspecified: Secondary | ICD-10-CM | POA: Diagnosis present

## 2018-07-03 DIAGNOSIS — Z978 Presence of other specified devices: Secondary | ICD-10-CM | POA: Diagnosis present

## 2018-07-03 DIAGNOSIS — R0902 Hypoxemia: Secondary | ICD-10-CM

## 2018-07-03 DIAGNOSIS — I5021 Acute systolic (congestive) heart failure: Secondary | ICD-10-CM

## 2018-07-03 DIAGNOSIS — I445 Left posterior fascicular block: Secondary | ICD-10-CM | POA: Diagnosis present

## 2018-07-03 DIAGNOSIS — J449 Chronic obstructive pulmonary disease, unspecified: Secondary | ICD-10-CM | POA: Diagnosis present

## 2018-07-03 DIAGNOSIS — J9600 Acute respiratory failure, unspecified whether with hypoxia or hypercapnia: Secondary | ICD-10-CM

## 2018-07-03 DIAGNOSIS — J441 Chronic obstructive pulmonary disease with (acute) exacerbation: Secondary | ICD-10-CM | POA: Diagnosis present

## 2018-07-03 DIAGNOSIS — I429 Cardiomyopathy, unspecified: Secondary | ICD-10-CM

## 2018-07-03 DIAGNOSIS — Z79899 Other long term (current) drug therapy: Secondary | ICD-10-CM

## 2018-07-03 DIAGNOSIS — I5042 Chronic combined systolic (congestive) and diastolic (congestive) heart failure: Secondary | ICD-10-CM | POA: Diagnosis present

## 2018-07-03 DIAGNOSIS — I10 Essential (primary) hypertension: Secondary | ICD-10-CM | POA: Diagnosis present

## 2018-07-03 DIAGNOSIS — Z6821 Body mass index (BMI) 21.0-21.9, adult: Secondary | ICD-10-CM

## 2018-07-03 DIAGNOSIS — Z4659 Encounter for fitting and adjustment of other gastrointestinal appliance and device: Secondary | ICD-10-CM

## 2018-07-03 DIAGNOSIS — Z1159 Encounter for screening for other viral diseases: Secondary | ICD-10-CM

## 2018-07-03 DIAGNOSIS — Z8673 Personal history of transient ischemic attack (TIA), and cerebral infarction without residual deficits: Secondary | ICD-10-CM

## 2018-07-03 DIAGNOSIS — Z7951 Long term (current) use of inhaled steroids: Secondary | ICD-10-CM

## 2018-07-03 DIAGNOSIS — Z01818 Encounter for other preprocedural examination: Secondary | ICD-10-CM

## 2018-07-03 DIAGNOSIS — I4892 Unspecified atrial flutter: Secondary | ICD-10-CM | POA: Diagnosis not present

## 2018-07-03 DIAGNOSIS — G459 Transient cerebral ischemic attack, unspecified: Secondary | ICD-10-CM | POA: Diagnosis present

## 2018-07-03 DIAGNOSIS — Z853 Personal history of malignant neoplasm of breast: Secondary | ICD-10-CM

## 2018-07-03 DIAGNOSIS — J9621 Acute and chronic respiratory failure with hypoxia: Secondary | ICD-10-CM | POA: Diagnosis not present

## 2018-07-03 DIAGNOSIS — Z7982 Long term (current) use of aspirin: Secondary | ICD-10-CM

## 2018-07-03 DIAGNOSIS — Z66 Do not resuscitate: Secondary | ICD-10-CM | POA: Diagnosis not present

## 2018-07-03 DIAGNOSIS — I428 Other cardiomyopathies: Secondary | ICD-10-CM | POA: Diagnosis present

## 2018-07-03 DIAGNOSIS — I081 Rheumatic disorders of both mitral and tricuspid valves: Secondary | ICD-10-CM | POA: Diagnosis present

## 2018-07-03 DIAGNOSIS — I4510 Unspecified right bundle-branch block: Secondary | ICD-10-CM | POA: Diagnosis present

## 2018-07-03 DIAGNOSIS — R570 Cardiogenic shock: Secondary | ICD-10-CM | POA: Diagnosis not present

## 2018-07-03 DIAGNOSIS — I130 Hypertensive heart and chronic kidney disease with heart failure and stage 1 through stage 4 chronic kidney disease, or unspecified chronic kidney disease: Secondary | ICD-10-CM | POA: Diagnosis not present

## 2018-07-03 DIAGNOSIS — I472 Ventricular tachycardia: Secondary | ICD-10-CM | POA: Diagnosis not present

## 2018-07-03 DIAGNOSIS — R140 Abdominal distension (gaseous): Secondary | ICD-10-CM

## 2018-07-03 DIAGNOSIS — Z7189 Other specified counseling: Secondary | ICD-10-CM

## 2018-07-03 DIAGNOSIS — I110 Hypertensive heart disease with heart failure: Secondary | ICD-10-CM | POA: Diagnosis not present

## 2018-07-03 DIAGNOSIS — Z880 Allergy status to penicillin: Secondary | ICD-10-CM

## 2018-07-03 DIAGNOSIS — Z832 Family history of diseases of the blood and blood-forming organs and certain disorders involving the immune mechanism: Secondary | ICD-10-CM

## 2018-07-03 DIAGNOSIS — S01512A Laceration without foreign body of oral cavity, initial encounter: Secondary | ICD-10-CM | POA: Diagnosis not present

## 2018-07-03 DIAGNOSIS — I469 Cardiac arrest, cause unspecified: Secondary | ICD-10-CM | POA: Diagnosis present

## 2018-07-03 DIAGNOSIS — E785 Hyperlipidemia, unspecified: Secondary | ICD-10-CM | POA: Diagnosis present

## 2018-07-03 DIAGNOSIS — E876 Hypokalemia: Secondary | ICD-10-CM | POA: Diagnosis not present

## 2018-07-03 DIAGNOSIS — Z9012 Acquired absence of left breast and nipple: Secondary | ICD-10-CM

## 2018-07-03 HISTORY — DX: Cocaine use, unspecified, uncomplicated: F14.90

## 2018-07-03 HISTORY — DX: Problem related to unspecified psychosocial circumstances: Z65.9

## 2018-07-03 HISTORY — DX: Chronic kidney disease, stage 2 (mild): N18.2

## 2018-07-03 HISTORY — DX: Chronic combined systolic (congestive) and diastolic (congestive) heart failure: I50.42

## 2018-07-03 HISTORY — DX: Problem related to social environment, unspecified: Z60.9

## 2018-07-03 HISTORY — DX: Other psychoactive substance abuse, uncomplicated: F19.10

## 2018-07-03 HISTORY — DX: Unspecified atrial flutter: I48.92

## 2018-07-03 HISTORY — DX: Homelessness unspecified: Z59.00

## 2018-07-03 LAB — RAPID URINE DRUG SCREEN, HOSP PERFORMED
Amphetamines: NOT DETECTED
Barbiturates: NOT DETECTED
Benzodiazepines: NOT DETECTED
Cocaine: POSITIVE — AB
Opiates: NOT DETECTED
Tetrahydrocannabinol: POSITIVE — AB

## 2018-07-03 LAB — CBC WITH DIFFERENTIAL/PLATELET
Abs Immature Granulocytes: 0.01 10*3/uL (ref 0.00–0.07)
Basophils Absolute: 0.1 10*3/uL (ref 0.0–0.1)
Basophils Relative: 1 %
Eosinophils Absolute: 0 10*3/uL (ref 0.0–0.5)
Eosinophils Relative: 1 %
HCT: 42.4 % (ref 36.0–46.0)
Hemoglobin: 13.4 g/dL (ref 12.0–15.0)
Immature Granulocytes: 0 %
Lymphocytes Relative: 39 %
Lymphs Abs: 2.2 10*3/uL (ref 0.7–4.0)
MCH: 23.4 pg — ABNORMAL LOW (ref 26.0–34.0)
MCHC: 31.6 g/dL (ref 30.0–36.0)
MCV: 74.1 fL — ABNORMAL LOW (ref 80.0–100.0)
Monocytes Absolute: 0.6 10*3/uL (ref 0.1–1.0)
Monocytes Relative: 10 %
Neutro Abs: 2.9 10*3/uL (ref 1.7–7.7)
Neutrophils Relative %: 49 %
Platelets: 241 10*3/uL (ref 150–400)
RBC: 5.72 MIL/uL — ABNORMAL HIGH (ref 3.87–5.11)
RDW: 27.1 % — ABNORMAL HIGH (ref 11.5–15.5)
WBC: 5.8 10*3/uL (ref 4.0–10.5)
nRBC: 0 % (ref 0.0–0.2)

## 2018-07-03 LAB — COMPREHENSIVE METABOLIC PANEL
ALT: 17 U/L (ref 0–44)
AST: 31 U/L (ref 15–41)
Albumin: 3.5 g/dL (ref 3.5–5.0)
Alkaline Phosphatase: 79 U/L (ref 38–126)
Anion gap: 15 (ref 5–15)
BUN: 26 mg/dL — ABNORMAL HIGH (ref 6–20)
CO2: 23 mmol/L (ref 22–32)
Calcium: 9.6 mg/dL (ref 8.9–10.3)
Chloride: 99 mmol/L (ref 98–111)
Creatinine, Ser: 1.35 mg/dL — ABNORMAL HIGH (ref 0.44–1.00)
GFR calc Af Amer: 53 mL/min — ABNORMAL LOW (ref >60)
GFR calc non Af Amer: 46 mL/min — ABNORMAL LOW (ref >60)
Glucose, Bld: 111 mg/dL — ABNORMAL HIGH (ref 70–99)
Potassium: 4 mmol/L (ref 3.5–5.1)
Sodium: 137 mmol/L (ref 135–145)
Total Bilirubin: 3.7 mg/dL — ABNORMAL HIGH (ref 0.3–1.2)
Total Protein: 7.3 g/dL (ref 6.5–8.1)

## 2018-07-03 LAB — HEMOGLOBIN A1C
Hgb A1c MFr Bld: 6.3 % — ABNORMAL HIGH (ref 4.8–5.6)
Mean Plasma Glucose: 134.11 mg/dL

## 2018-07-03 LAB — TROPONIN I
Troponin I: 0.04 ng/mL (ref <0.03)
Troponin I: 0.06 ng/mL (ref <0.03)

## 2018-07-03 LAB — MAGNESIUM: Magnesium: 1.8 mg/dL (ref 1.7–2.4)

## 2018-07-03 LAB — TSH: TSH: 3.244 u[IU]/mL (ref 0.350–4.500)

## 2018-07-03 LAB — ETHANOL: Alcohol, Ethyl (B): <10 mg/dL (ref <10)

## 2018-07-03 LAB — BRAIN NATRIURETIC PEPTIDE: B Natriuretic Peptide: 2450.2 pg/mL — ABNORMAL HIGH (ref 0.0–100.0)

## 2018-07-03 MED ORDER — ASPIRIN 81 MG PO TBEC: 81.0000 mg | DELAYED_RELEASE_TABLET | Freq: Every day | ORAL | Status: DC

## 2018-07-03 MED ORDER — ADENOSINE 6 MG/2ML IV SOLN
6.0000 mg | Freq: Once | INTRAVENOUS | Status: AC
  Administered 2018-07-03: 6 mg via INTRAVENOUS

## 2018-07-03 MED ORDER — FLUTICASONE PROPIONATE 50 MCG/ACT NA SUSP
2.0000 | Freq: Every day | NASAL | Status: DC
  Administered 2018-07-04: 2 via NASAL
  Filled 2018-07-03: qty 16

## 2018-07-03 MED ORDER — CYCLOBENZAPRINE HCL 10 MG PO TABS
10.0000 mg | ORAL_TABLET | Freq: Two times a day (BID) | ORAL | Status: DC | PRN
  Administered 2018-07-03 – 2018-07-04 (×3): 10 mg via ORAL
  Filled 2018-07-03 (×3): qty 1

## 2018-07-03 MED ORDER — SODIUM CHLORIDE 0.9 % IV SOLN
250.0000 mL | INTRAVENOUS | Status: DC | PRN
  Administered 2018-07-05: 10 mL via INTRAVENOUS
  Administered 2018-07-07 – 2018-07-11 (×2): 250 mL via INTRAVENOUS

## 2018-07-03 MED ORDER — ASPIRIN EC 81 MG PO TBEC
81.0000 mg | DELAYED_RELEASE_TABLET | Freq: Every day | ORAL | Status: DC
  Administered 2018-07-04 – 2018-07-05 (×2): 81 mg via ORAL
  Filled 2018-07-03 (×2): qty 1

## 2018-07-03 MED ORDER — ADULT MULTIVITAMIN W/MINERALS CH
1.0000 | ORAL_TABLET | Freq: Every day | ORAL | Status: DC
  Administered 2018-07-03 – 2018-07-05 (×3): 1 via ORAL
  Filled 2018-07-03 (×3): qty 1

## 2018-07-03 MED ORDER — HEPARIN (PORCINE) 25000 UT/250ML-% IV SOLN
900.0000 [IU]/h | INTRAVENOUS | Status: DC
  Administered 2018-07-03: 900 [IU]/h via INTRAVENOUS
  Filled 2018-07-03: qty 250

## 2018-07-03 MED ORDER — FUROSEMIDE 10 MG/ML IJ SOLN
40.0000 mg | Freq: Two times a day (BID) | INTRAMUSCULAR | Status: DC
  Administered 2018-07-03 – 2018-07-04 (×2): 40 mg via INTRAVENOUS
  Filled 2018-07-03 (×2): qty 4

## 2018-07-03 MED ORDER — ACETAMINOPHEN 325 MG PO TABS
650.0000 mg | ORAL_TABLET | ORAL | Status: DC | PRN
  Administered 2018-07-04 – 2018-07-08 (×3): 650 mg via ORAL
  Filled 2018-07-03 (×3): qty 2

## 2018-07-03 MED ORDER — ONDANSETRON HCL 4 MG/2ML IJ SOLN: 4.0000 mg | Freq: Four times a day (QID) | INTRAMUSCULAR | Status: DC | PRN

## 2018-07-03 MED ORDER — HEPARIN BOLUS VIA INFUSION
3000.0000 [IU] | Freq: Once | INTRAVENOUS | Status: AC
  Administered 2018-07-03: 3000 [IU] via INTRAVENOUS
  Filled 2018-07-03: qty 3000

## 2018-07-03 MED ORDER — ADENOSINE 6 MG/2ML IV SOLN
6.0000 mg | Freq: Once | INTRAVENOUS | Status: AC
  Administered 2018-07-03: 6 mg via INTRAVENOUS
  Filled 2018-07-03: qty 2

## 2018-07-03 MED ORDER — ATORVASTATIN CALCIUM 40 MG PO TABS
40.0000 mg | ORAL_TABLET | Freq: Every day | ORAL | Status: DC
  Administered 2018-07-03 – 2018-07-11 (×9): 40 mg via ORAL
  Filled 2018-07-03 (×10): qty 1

## 2018-07-03 MED ORDER — PANTOPRAZOLE SODIUM 40 MG PO TBEC
40.0000 mg | DELAYED_RELEASE_TABLET | Freq: Every day | ORAL | Status: DC
  Administered 2018-07-03 – 2018-07-04 (×2): 40 mg via ORAL
  Filled 2018-07-03 (×2): qty 1

## 2018-07-03 MED ORDER — ENOXAPARIN SODIUM 60 MG/0.6ML ~~LOC~~ SOLN
60.0000 mg | Freq: Two times a day (BID) | SUBCUTANEOUS | Status: DC
  Administered 2018-07-03 – 2018-07-06 (×6): 60 mg via SUBCUTANEOUS
  Filled 2018-07-03 (×6): qty 0.6

## 2018-07-03 MED ORDER — UMECLIDINIUM BROMIDE 62.5 MCG/INH IN AEPB
1.0000 | INHALATION_SPRAY | Freq: Every day | RESPIRATORY_TRACT | Status: DC
  Filled 2018-07-03: qty 7

## 2018-07-03 MED ORDER — MOMETASONE FURO-FORMOTEROL FUM 100-5 MCG/ACT IN AERO
2.0000 | INHALATION_SPRAY | Freq: Two times a day (BID) | RESPIRATORY_TRACT | Status: DC
  Administered 2018-07-03 – 2018-07-04 (×2): 2 via RESPIRATORY_TRACT
  Filled 2018-07-03: qty 8.8

## 2018-07-03 MED ORDER — DILTIAZEM HCL-DEXTROSE 100-5 MG/100ML-% IV SOLN (PREMIX)
5.0000 mg/h | INTRAVENOUS | Status: DC
  Administered 2018-07-03: 5 mg/h via INTRAVENOUS
  Filled 2018-07-03: qty 100

## 2018-07-03 MED ORDER — SODIUM CHLORIDE 0.9% FLUSH: 3.0000 mL | INTRAVENOUS | Status: DC | PRN

## 2018-07-03 MED ORDER — TIOTROPIUM BROMIDE MONOHYDRATE 18 MCG IN CAPS: 18.0000 ug | ORAL_CAPSULE | Freq: Every day | RESPIRATORY_TRACT | Status: DC

## 2018-07-03 MED ORDER — DILTIAZEM HCL-DEXTROSE 100-5 MG/100ML-% IV SOLN (PREMIX)
5.0000 mg/h | INTRAVENOUS | Status: DC
  Administered 2018-07-04 (×3): 10 mg/h via INTRAVENOUS
  Filled 2018-07-03 (×4): qty 100

## 2018-07-03 MED ORDER — SENNOSIDES-DOCUSATE SODIUM 8.6-50 MG PO TABS
1.0000 | ORAL_TABLET | Freq: Two times a day (BID) | ORAL | Status: DC | PRN
  Administered 2018-07-03 – 2018-07-04 (×2): 1 via ORAL
  Filled 2018-07-03 (×2): qty 1

## 2018-07-03 MED ORDER — SODIUM CHLORIDE 0.9% FLUSH
3.0000 mL | Freq: Two times a day (BID) | INTRAVENOUS | Status: DC
  Administered 2018-07-03 – 2018-07-12 (×10): 3 mL via INTRAVENOUS

## 2018-07-03 MED ORDER — FUROSEMIDE 10 MG/ML IJ SOLN
60.0000 mg | Freq: Once | INTRAMUSCULAR | Status: AC
  Administered 2018-07-03: 60 mg via INTRAVENOUS
  Filled 2018-07-03: qty 6

## 2018-07-03 MED ORDER — ALBUTEROL SULFATE (2.5 MG/3ML) 0.083% IN NEBU
3.0000 mL | INHALATION_SOLUTION | Freq: Four times a day (QID) | RESPIRATORY_TRACT | Status: DC | PRN
  Administered 2018-07-03 – 2018-07-04 (×2): 3 mL via RESPIRATORY_TRACT
  Filled 2018-07-03 (×2): qty 3

## 2018-07-03 NOTE — Progress Notes (Signed)
Reported by phlebotomist that she refused blood drawing and became verbally abusive with her. Talked with pt. became nasty and manipulative.

## 2018-07-03 NOTE — Progress Notes (Signed)
[place] for Heparin to Lovenox Indication: atrial fibrillation  Allergies  Allergen Reactions  . Penicillins Other (See Comments)    Pt had slight throat swelling Has patient had a PCN reaction causing immediate rash, facial/tongue/throat swelling, SOB or lightheadedness with hypotension: yes Has patient had a PCN reaction causing severe rash involving mucus membranes or skin necrosis: No Has patient had a PCN reaction that required hospitalization No Has patient had a PCN reaction occurring within the last 10 years: No If all of the above answers are "NO", then may proceed with Cephalosporin use.     Patient Measurements: Height: 5\' 5"  (165.1 cm) Weight: 126 lb 15.8 oz (57.6 kg) IBW/kg (Calculated) : 57  Vital Signs: Temp: 97.1 F (36.2 C) (06/16 1627) Temp Source: Oral (06/16 1627) BP: 100/80 (06/16 1627) Pulse Rate: 94 (06/16 1627)  Labs: Recent Labs    07/03/18 0501  HGB 13.4  HCT 42.4  PLT 241  CREATININE 1.35*  TROPONINI 0.04*    Estimated Creatinine Clearance: 44.9 mL/min (A) (by C-G formula based on SCr of 1.35 mg/dL (H)).    [name] c/o SOB x2d now associated w/ chest tightness, found to be in Afib, to transition to Lovenox   Refusing blood draws  Goal of Therapy:  Appropriate Lovenox dosing Monitor platelets by anticoagulation protocol: Yes   Plan:  DC heparin Begin Lovenox 60 mg Sq Q 12 hours  Thank you Anette Guarneri, PharmD [phone] 07/03/2018,4:51 PM

## 2018-07-03 NOTE — Progress Notes (Signed)
ANTICOAGULATION CONSULT NOTE - Initial Consult  Pharmacy Consult for heparin Indication: atrial fibrillation  Allergies  Allergen Reactions  . Penicillins Other (See Comments)    Pt had slight throat swelling Has patient had a PCN reaction causing immediate rash, facial/tongue/throat swelling, SOB or lightheadedness with hypotension: yes Has patient had a PCN reaction causing severe rash involving mucus membranes or skin necrosis: No Has patient had a PCN reaction that required hospitalization No Has patient had a PCN reaction occurring within the last 10 years: No If all of the above answers are "NO", then may proceed with Cephalosporin use.     Patient Measurements: Height: 5\' 5"  (165.1 cm) Weight: 126 lb 15.8 oz (57.6 kg) IBW/kg (Calculated) : 57  Vital Signs: Temp: 97.4 F (36.3 C) (06/16 0502) Temp Source: Oral (06/16 0502) BP: 116/89 (06/16 0631) Pulse Rate: 144 (06/16 0631)  Labs: Recent Labs    07/03/18 0501  HGB 13.4  HCT 42.4  PLT 241  CREATININE 1.35*  TROPONINI 0.04*    Estimated Creatinine Clearance: 44.9 mL/min (A) (by C-G formula based on SCr of 1.35 mg/dL (H)).   Medical History: Past Medical History:  Diagnosis Date  . Breast cancer ([place])   . Breast cancer, stage 2 ([place]) 02/23/2011  . COPD (chronic obstructive pulmonary disease) ([place])   . Hypertension   . TIA (transient ischemic attack) 11/25/2011   "first time" (11/25/2011)    [name] c/o SOB x2d now associated w/ chest tightness, found to be in Afib, to begin heparin.  Goal of Therapy:  Heparin level 0.3-0.7 units/ml Monitor platelets by anticoagulation protocol: Yes   Plan:  Will give heparin 3000 units IV bolus x1 followed by gtt at 900 units/hr and monitor heparin levels and CBC.  Wynona Neat, PharmD, BCPS  07/03/2018,6:45 AM

## 2018-07-03 NOTE — H&P (Signed)
History and Physical    Guillermina Holland HAL:[id number] DOB: 21-Jul-1970 DOA: 07/03/2018  PCP: Gildardo Pounds, NP Consultants:  Croitoru - cardiology; Causey - oncology; Magrinat - GI Patient coming from:  Home - lives with daughter; Sabrina Holland, [phone]  Chief Complaint: SOB  HPI: Sabrina Holland is a 48 y.o. female with medical history significant of TIA; HTN; COPD; breast cancer s/p mastectomy; and polysubstance abuse presenting with SOB.  She knows she has CHF and COPD.  She was doing cocaine in the past and selling it recently - she reports she has not personally used in it in the last 3 months but might have contact by selling it.   For the last 3 days, severely SOB.  Neck pain.  Her balance was off.  Easy bruising.  Significant CP and B rib pain from breathing so hard.  She took Lasix with minimal UOP.  No weight gain.  +edema from knees to her thighs.  No appetite.  She feels better, 5/10; was "15/10" on arrival.  On Dilt 10 with HR 140s.  Breathing better now, things are not blurry and spinning.  She is feeling like she is hungry.   She is doing rehab and AA, one relapse about 60 days ago.  Her brother reports that "it looks like she been doing it" - still using cocaine.   ED Course: Carryover, per Dr. Cyndia Diver:  48 year old female with history of polysubstance abuse presents with chest pain and shortness of breath is found to be in atrial flutter with RVR started on Cardizem drip. Heparin will be started. Patient also is found to be in CHF for which ER physician will be ordering Lasix.  Review of Systems: As per HPI; otherwise review of systems reviewed and negative.   Ambulatory Status:  Ambulates without assistance  Past Medical History:  Diagnosis Date  . Atrial flutter ([place]) 07/03/2018  . Breast cancer, stage 2 ([place]) 02/23/2011  . Congestive heart failure (CHF) ([place])   . COPD (chronic obstructive pulmonary disease) ([place])   . Hypertension   . TIA  (transient ischemic attack) 11/25/2011   "first time" (11/25/2011)    Past Surgical History:  Procedure Laterality Date  . BREAST BIOPSY  2007   left  . MASTECTOMY  09/2005   left  . TONSILLECTOMY AND ADENOIDECTOMY     "when I was little" (11/25/2011)  . TUBAL LIGATION  1993    Social History   Socioeconomic History  . Marital status: Single    Spouse name: Not on file  . Number of children: 4  . Years of education: Not on file  . Highest education level: Not on file  Occupational History  . Occupation: trying to get disability    Employer: [employer]  Social Needs  . Financial resource strain: Not on file  . Food insecurity    Worry: Not on file    Inability: Not on file  . Transportation needs    Medical: Not on file    Non-medical: Not on file  Tobacco Use  . Smoking status: Current Some Day Smoker    Packs/day: 5.00    Years: 30.00    Pack years: 150.00    Types: Cigarettes  . Smokeless tobacco: Never Used  . Tobacco comment: 1 or 2 cigarettes a day- approx 1 pack week  Substance and Sexual Activity  . Alcohol use: Not Currently    Alcohol/week: 12.0 standard drinks    Types: 12 Cans of beer per week  Comment: last drink was 5/19  . Drug use: Not Currently    Types: Marijuana    Comment: daily marijuana; last use of cocaine 05/02/18 but selling it  . Sexual activity: Not Currently    Birth control/protection: Surgical  Lifestyle  . Physical activity    Days per week: Not on file    Minutes per session: Not on file  . Stress: Not on file  Relationships  . Social Herbalist on phone: Not on file    Gets together: Not on file    Attends religious service: Not on file    Active member of club or organization: Not on file    Attends meetings of clubs or organizations: Not on file    Relationship status: Not on file  . Intimate partner violence    Fear of current or ex partner: Not on file    Emotionally abused: Not on file    Physically  abused: Not on file    Forced sexual activity: Not on file  Other Topics Concern  . Not on file  Social History Narrative   ** Merged History Encounter **        Allergies  Allergen Reactions  . Penicillins Other (See Comments)    Pt had slight throat swelling Has patient had a PCN reaction causing immediate rash, facial/tongue/throat swelling, SOB or lightheadedness with hypotension: yes Has patient had a PCN reaction causing severe rash involving mucus membranes or skin necrosis: No Has patient had a PCN reaction that required hospitalization No Has patient had a PCN reaction occurring within the last 10 years: No If all of the above answers are "NO", then may proceed with Cephalosporin use.     Family History  Problem Relation Age of Onset  . COPD Father   . Sickle cell anemia Cousin        maternal cousin    Prior to Admission medications   Medication Sig Start Date End Date Taking? Authorizing Provider  albuterol (PROVENTIL HFA) 108 (90 Base) MCG/ACT inhaler Inhale 1-2 puffs into the lungs every 6 (six) hours as needed for up to 1 day for wheezing or shortness of breath (cough). 06/18/18 07/03/27 Yes Kilroy, Luke K, PA-C  albuterol (PROVENTIL) (2.5 MG/3ML) 0.083% nebulizer solution Take 3 mLs (2.5 mg total) by nebulization every 6 (six) hours as needed for wheezing or shortness of breath. 06/18/18  Yes Kilroy, Luke K, PA-C  amLODipine (NORVASC) 5 MG tablet Take 1 tablet (5 mg total) by mouth daily. 03/05/18 07/03/27 Yes Gildardo Pounds, NP  aspirin EC 81 MG EC tablet Take 1 tablet (81 mg total) by mouth daily. 06/24/17  Yes Reyne Dumas, MD  atorvastatin (LIPITOR) 40 MG tablet Take 1 tablet (40 mg total) by mouth daily at 6 PM. 03/05/18  Yes Gildardo Pounds, NP  budesonide-formoterol (SYMBICORT) 80-4.5 MCG/ACT inhaler Inhale 2 puffs into the lungs 2 (two) times daily. 05/08/18  Yes Gildardo Pounds, NP  cyclobenzaprine (FLEXERIL) 10 MG tablet Take 1 tablet (10 mg total) by mouth 2  (two) times daily as needed for up to 10 doses for muscle spasms. 04/02/18  Yes Curatolo, Adam, DO  diclofenac sodium (VOLTAREN) 1 % GEL Apply 2 g topically 4 (four) times daily. 04/23/18  Yes Gildardo Pounds, NP  ferrous sulfate 325 (65 FE) MG tablet Take 1 tablet (325 mg total) by mouth 2 (two) times daily with a meal. 04/18/18  Yes Norval Morton, MD  fluticasone (  FLONASE) 50 MCG/ACT nasal spray Place 2 sprays into both nostrils daily. 12/05/17  Yes Eugenie Filler, MD  furosemide (LASIX) 20 MG tablet Take 3 tablets (60 mg total) by mouth 2 (two) times daily for 30 days. 04/23/18 07/03/27 Yes Gildardo Pounds, NP  lidocaine (LIDODERM) 5 % Place 1 patch onto the skin daily. Remove & Discard patch within 12 hours or as directed by MD 04/29/18  Yes Jerilee Hoh, Ana P, PA-C  lisinopril (PRINIVIL,ZESTRIL) 20 MG tablet Take 1 tablet (20 mg total) by mouth daily. 03/05/18 07/03/27 Yes Gildardo Pounds, NP  metolazone (ZAROXOLYN) 2.5 MG tablet Take 1 tablet on Wednesday and Saturdays Patient taking differently: Take 2.5 mg by mouth See admin instructions. Take 1 tablet on Wednesday and Saturdays 06/18/18  Yes Kilroy, Doreene Burke, PA-C  Multiple Vitamin (MULTIVITAMIN WITH MINERALS) TABS tablet Take 1 tablet by mouth daily.   Yes [provider]  omeprazole (PRILOSEC) 20 MG capsule Take 1 capsule (20 mg total) by mouth daily. 03/05/18 07/03/27 Yes Gildardo Pounds, NP  senna-docusate (SENOKOT-S) 8.6-50 MG tablet Take 1 tablet by mouth 2 (two) times daily as needed for mild constipation. 04/18/18  Yes Norval Morton, MD  spironolactone (ALDACTONE) 25 MG tablet Take 1 tablet (25 mg total) by mouth daily. 04/23/18 07/22/18 Yes Gildardo Pounds, NP  tiotropium (SPIRIVA HANDIHALER) 18 MCG inhalation capsule Place 1 capsule (18 mcg total) into inhaler and inhale daily. 03/05/18 03/05/19 Yes Gildardo Pounds, NP  nicotine (NICODERM CQ - DOSED IN MG/24 HOURS) 14 mg/24hr patch Place 1 patch (14 mg total) onto the skin daily.  Patient not taking: Reported on 07/03/2018 03/05/18   Gildardo Pounds, NP    Physical Exam: Vitals:   07/03/18 0845 07/03/18 0900 07/03/18 0915 07/03/18 0930  BP: 114/87 107/80 (!) 112/96 103/89  Pulse:      Resp: (!) 26 (!) 26 (!) 26 (!) 25  Temp:      TempSrc:      SpO2:      Weight:      Height:         . General:  Appears calm and comfortable and is NAD . Eyes:  PERRL, EOMI, normal lids, iris . ENT:  grossly normal hearing, lips & tongue, mmm; poor dentition . Neck:  no LAD, masses or thyromegaly . Cardiovascular:  RR with tachycardia to 145, no m/r/g. No LE edema.  Marland Kitchen Respiratory:   CTA bilaterally with no wheezes/rales/rhonchi.  Mild tachypnea at rest, worse with minimal exertion or excessive discussion. . Abdomen:  soft, NT, ND, NABS . Back:   normal alignment, no CVAT . Skin:  no rash or induration seen on limited exam . Musculoskeletal:  grossly normal tone BUE/BLE, good ROM, no bony abnormality . Psychiatric:  grossly normal mood and affect, speech fluent and appropriate, AOx3 . Neurologic:  CN 2-12 grossly intact, moves all extremities in coordinated fashion, sensation intact    Radiological Exams on Admission: Dg Chest Portable 1 View  Result Date: 07/03/2018 CLINICAL DATA:  Shortness of breath for 2 days. Chest tightness. Progressive symptoms. EXAM: PORTABLE CHEST 1 VIEW COMPARISON:  Two-view chest x-ray 05/21/2018 FINDINGS: Heart is enlarged. There is no edema or effusion. Defibrillator pads are in place. Heart size and pads obscure the retrocardiac space. No focal airspace disease is present. Mild curvature of the thoracic spine is stable. The visualized soft tissues and bony thorax are otherwise unremarkable. IMPRESSION: 1. Stable cardiomegaly without failure. 2. No acute cardiopulmonary  disease. Electronically Signed   By: San Morelle M.D.   On: 07/03/2018 05:46    EKG: Independently reviewed.  Atrial flutter with rate 144; RBBB; LVH; prolonged QTc 586  nonspecific ST changes with no evidence of acute ischemia   Labs on Admission: I have personally reviewed the available labs and imaging studies at the time of the admission.  Pertinent labs:   Glucose 111 BUN 26/Creatinine 1.35/GFR 53; 18/1.10/>60 on 5/4 BNP 2450.2; 1580.9 on 5/4 Troponin 0.04; 0.27 on 5/4 WBC 5.8 TSH 3.244 ETOH <10 Send-out COVID test pending UDS + cocaine, THC  Assessment/Plan Principal Problem:   Atrial flutter (HCC) Active Problems:   History of breast cancer   Essential hypertension   Polysubstance abuse (HCC)   COPD (chronic obstructive pulmonary disease) (HCC)   Acute on chronic systolic and diastolic heart failure, NYHA class 3 (HCC)   Atrial flutter -Patient presenting with new-onset vs. Recurrent aflutter (prior EKGs with tachycardia but possibly sinus) -Etiology is thought to be related to HTN, CHF, drug abuse, hypoxia from COPD.  -The onset is unknown, so will currently focus on rate control; however, flutter is often hard to control with medications and she is at risk for needing electrical cardioversion. -Emergent cardioversion is not necessary since she is now hemodynamically stable; pacing pads were placed on the patient in the ER but these can be removed at this time. -Will admit to SDU for Diltiazem drip as per protocol with plan to transition to PO Diltiazem once heart rate is controlled.   -Troponin q6h x 3 -Continue ASA 81 mg PO daily.   -Will request repeat Echocardiogram for further evaluation  -Will risk stratify with FLP and HgbA1c; will also order TSH -Repeat EKG in AM -Will consult cardiology due to concern for needing TEE/cardioversion; NPO until after their consult -CHA2DS2-VASc Score is >2 and so patient will need oral anticoagulation; she has been started on a heparin drip for now. -Will place in observation status in PCU  Acute on chronic combined heart failure -Patient presenting with worsening SOB  -It is likely that the  CHF exacerbation is related to the aflutter -Negative CXR -Elevated BNP -Echo in 11/19 with EF 25-30%, likely associated with cocaine/polysubstance abuse -No ACE/BB given Cardizem drip for now -CHF order set utilized -Was given Lasix 40 mg x 1 in ER and will repeat with 40 mg IV BID for now -Continue [place] O2 for now -Normal/slightly worse kidney function at this time, will follow -Repeat EKG in AM  HTN -Hold home medications while on Cardizem drip -Home meds include Norvasc, Lisinopril, Spironolactone, Zaroxolyn  Polysubstance abuse -Patient reports that she is no longer drinking alcohol or using illicit drugs other than marijuana -She acknowledges that she is selling cocaine and purports that this is why her UDS is + for cocaine -This is an unlikely explanation -Ongoing efforts at cessation have been encouraged -Tobacco Dependence: encourage cessation.   -This was discussed with the patient and should be reviewed on an ongoing basis.   -She reports minimal use at this time and declines a patch.  COPD -Not on home O2 -Continue Albuterol HFA prn -Continue Symbicort and Spiriva -Currently low suspicion for primary lung issue as the driving force for symptoms, but this certainly could be a contributing factor  Note: This patient has been tested and is pending for the novel coronavirus COVID-19.   DVT prophylaxis: Heparin Code Status:  Full - confirmed with patient Family Communication: None present; I spoke with her  brother by telephone Disposition Plan:  Home once clinically improved Consults called: Cardiology; CM/PT  Admission status: It is my clinical opinion that referral for OBSERVATION is reasonable and necessary in this patient based on the above information provided. The aforementioned taken together are felt to place the patient at high risk for further clinical deterioration. However it is anticipated that the patient may be medically stable for discharge from the hospital  within 24 to 48 hours.      Karmen Bongo MD [facility]   How to contact the [facility] Attending or Consulting provider [place] or covering provider during after hours [place], for this patient?  1. Check the care team in [facility] and look for a) attending/consulting [facility] provider listed and b) the [facility] team listed 2. Log into [email or web address] and use [place]'s universal password to access. If you do not have the password, please contact the hospital operator. 3. Locate the [facility] provider you are looking for under [facility] and page to a number that you can be directly reached. 4. If you still have difficulty reaching the provider, please page the [facility] (Director on Call) for the Hospitalists listed on amion for assistance.   07/03/2018, 10:23 AM

## 2018-07-03 NOTE — ED Notes (Signed)
Pt. Wanting to wait to use bathroom; pt. Refuse bedpan. Pt. Stated that she needed to have a bm. Pt. Also asked to speak with a doctor. RN notified.

## 2018-07-03 NOTE — ED Triage Notes (Signed)
Pt arrived via [facility]; pt from hm with c/o SOB x 2 days; pt states that she begin having tightness in chest and it has increasingly gotten worse; EMS reports monitor reflects A flutter; Pt restricted on the L for mastectomy; Pt rec'd 324mg  ASA; 110/70, 144, 24, 98.1

## 2018-07-03 NOTE — ED Notes (Addendum)
ED TO INPATIENT HANDOFF REPORT  ED Nurse Name and Phone #:   [phone]  [name] Name/Age/Gender Sabrina Holland 48 y.o. female Room/Bed: 037C/037C  Code Status   Code Status: Prior  Home/SNF/Other Home Patient oriented to: self, place, time and situation Is this baseline? Yes   Triage Complete: Triage complete  Chief Complaint sob  Triage Note Pt arrived via [facility]; pt from hm with c/o SOB x 2 days; pt states that she begin having tightness in chest and it has increasingly gotten worse; EMS reports monitor reflects A flutter; Pt restricted on the L for mastectomy; Pt rec'd 324mg  ASA; 110/70, 144, 24, 98.1    Allergies Allergies  Allergen Reactions  . Penicillins Other (See Comments)    Pt had slight throat swelling Has patient had a PCN reaction causing immediate rash, facial/tongue/throat swelling, SOB or lightheadedness with hypotension: yes Has patient had a PCN reaction causing severe rash involving mucus membranes or skin necrosis: No Has patient had a PCN reaction that required hospitalization No Has patient had a PCN reaction occurring within the last 10 years: No If all of the above answers are "NO", then may proceed with Cephalosporin use.     Level of Care/Admitting Diagnosis ED Disposition    ED Disposition Condition [facility] Area: [place] [100100]  Level of Care: Progressive [102]  I expect the patient will be discharged within 24 hours: No (not a candidate for 5C-Observation unit)  Covid Evaluation: Screening Protocol (No Symptoms)  Diagnosis: Atrial flutter (HCC) [427.32.ICD-9-CM]  Admitting Physician: Rise Patience [phone]  Attending Physician: Rise Patience [phone]  PT Class (Do Not Modify): Observation [104]  PT Acc Code (Do Not Modify): Observation [10022]       B Medical/Surgery History Past Medical History:  Diagnosis Date  . Atrial flutter ([place]) 07/03/2018  . Breast cancer, stage 2  ([place]) 02/23/2011  . Congestive heart failure (CHF) ([place])   . COPD (chronic obstructive pulmonary disease) ([place])   . Hypertension   . TIA (transient ischemic attack) 11/25/2011   "first time" (11/25/2011)   Past Surgical History:  Procedure Laterality Date  . BREAST BIOPSY  2007   left  . MASTECTOMY  09/2005   left  . TONSILLECTOMY AND ADENOIDECTOMY     "when I was little" (11/25/2011)  . TUBAL LIGATION  1993     A IV Location/Drains/Wounds Patient Lines/Drains/Airways Status   Active Line/Drains/Airways    Name:   Placement date:   Placement time:   Site:   Days:   Peripheral IV 07/03/18 Right Wrist   07/03/18    0500    Wrist   less than 1   Peripheral IV 07/03/18 Right;Posterior Forearm   07/03/18    0708    Forearm   less than 1          Intake/Output Last 24 hours  Intake/Output Summary (Last 24 hours) at 07/03/2018 0939 Last data filed at 07/03/2018 0703 Gross per 24 hour  Intake 6.38 ml  Output -  Net 6.38 ml    Labs/Imaging Results for orders placed or performed during the hospital encounter of 07/03/18 (from the past 48 hour(s))  CBC with Differential     Status: Abnormal   Collection Time: 07/03/18  5:01 AM  Result Value Ref Range   WBC 5.8 4.0 - 10.5 K/uL   RBC 5.72 (H) 3.87 - 5.11 MIL/uL   Hemoglobin 13.4 12.0 - 15.0 g/dL  HCT 42.4 36.0 - 46.0 %   MCV 74.1 (L) 80.0 - 100.0 fL   MCH 23.4 (L) 26.0 - 34.0 pg   MCHC 31.6 30.0 - 36.0 g/dL   RDW 27.1 (H) 11.5 - 15.5 %   Platelets 241 150 - 400 K/uL    Comment: REPEATED TO VERIFY   nRBC 0.0 0.0 - 0.2 %   Neutrophils Relative % 49 %   Neutro Abs 2.9 1.7 - 7.7 K/uL   Lymphocytes Relative 39 %   Lymphs Abs 2.2 0.7 - 4.0 K/uL   Monocytes Relative 10 %   Monocytes Absolute 0.6 0.1 - 1.0 K/uL   Eosinophils Relative 1 %   Eosinophils Absolute 0.0 0.0 - 0.5 K/uL   Basophils Relative 1 %   Basophils Absolute 0.1 0.0 - 0.1 K/uL   Immature Granulocytes 0 %   Abs Immature Granulocytes 0.01 0.00 - 0.07 K/uL     Comment: Performed at [facility] [address] [place], [place] [address]  Comprehensive metabolic panel     Status: Abnormal   Collection Time: 07/03/18  5:01 AM  Result Value Ref Range   Sodium 137 135 - 145 mmol/L   Potassium 4.0 3.5 - 5.1 mmol/L   Chloride 99 98 - 111 mmol/L   CO2 23 22 - 32 mmol/L   Glucose, Bld 111 (H) 70 - 99 mg/dL   BUN 26 (H) 6 - 20 mg/dL   Creatinine, Ser 1.35 (H) 0.44 - 1.00 mg/dL   Calcium 9.6 8.9 - 10.3 mg/dL   Total Protein 7.3 6.5 - 8.1 g/dL   Albumin 3.5 3.5 - 5.0 g/dL   AST 31 15 - 41 U/L   ALT 17 0 - 44 U/L   Alkaline Phosphatase 79 38 - 126 U/L   Total Bilirubin 3.7 (H) 0.3 - 1.2 mg/dL   GFR calc non Af Amer 46 (L) >60 mL/min   GFR calc Af Amer 53 (L) >60 mL/min   Anion gap 15 5 - 15    Comment: Performed at [facility] [address], [place], [place] [address]  Ethanol     Status: None   Collection Time: 07/03/18  5:01 AM  Result Value Ref Range   Alcohol, Ethyl (B) <10 <10 mg/dL    Comment: (NOTE) Lowest detectable limit for serum alcohol is 10 mg/dL. For medical purposes only. Performed at [facility], [place] [address] [place], [place] [address]   Troponin I - ONCE - STAT     Status: Abnormal   Collection Time: 07/03/18  5:01 AM  Result Value Ref Range   Troponin I 0.04 (HH) <0.03 ng/mL    Comment: CRITICAL RESULT CALLED TO, READ BACK BY AND VERIFIED WITH: FLORES,M RN 07/03/2018 0543 JORDANS Performed at [facility], [place] [address], [place], [place] [address]   Magnesium     Status: None   Collection Time: 07/03/18  5:01 AM  Result Value Ref Range   Magnesium 1.8 1.7 - 2.4 mg/dL    Comment: Performed at [facility] [address], [place], [place] [address]  TSH     Status: None   Collection Time: 07/03/18  5:01 AM  Result Value Ref Range   TSH 3.244 0.350 - 4.500 uIU/mL    Comment: Performed by a 3rd Generation assay with a functional sensitivity of <=0.01 uIU/mL. Performed at [facility], [place] [address], [place], [place] [address]  Brain natriuretic peptide     Status: Abnormal   Collection Time: 07/03/18  5:03 AM  Result Value Ref Range   B Natriuretic Peptide 2,450.2 (H) 0.0 - 100.0 pg/mL    Comment: Performed at [facility] [address], [place], [place] [address]  Rapid urine drug screen (hospital performed)     Status: Abnormal   Collection Time: 07/03/18  7:02 AM  Result Value Ref Range   Opiates NONE DETECTED NONE DETECTED   Cocaine POSITIVE (A) NONE DETECTED   Benzodiazepines NONE DETECTED NONE DETECTED   Amphetamines NONE DETECTED NONE DETECTED   Tetrahydrocannabinol POSITIVE (A) NONE DETECTED   Barbiturates NONE DETECTED NONE DETECTED    Comment: (NOTE) DRUG SCREEN FOR MEDICAL PURPOSES ONLY.  IF CONFIRMATION IS NEEDED FOR ANY PURPOSE, NOTIFY LAB WITHIN 5 DAYS. LOWEST DETECTABLE LIMITS FOR URINE DRUG SCREEN Drug Class                     Cutoff (ng/mL) Amphetamine and metabolites    1000 Barbiturate and metabolites    200 Benzodiazepine                 269 Tricyclics and metabolites     300 Opiates and metabolites        300 Cocaine and metabolites        300 THC                            50 Performed at [facility], [place] [address] [place],  [address]    Dg Chest Portable 1 View  Result Date: 07/03/2018 CLINICAL DATA:  Shortness of breath for 2 days. Chest tightness. Progressive symptoms. EXAM: PORTABLE CHEST 1 VIEW COMPARISON:  Two-view chest x-ray 05/21/2018 FINDINGS: Heart is enlarged. There is no edema or effusion. Defibrillator pads are in place. Heart size and pads obscure the retrocardiac space. No focal airspace disease is present. Mild curvature of the thoracic spine is stable. The visualized soft tissues and bony thorax are otherwise unremarkable. IMPRESSION: 1. Stable cardiomegaly without failure. 2. No acute cardiopulmonary disease. Electronically Signed   By: San Morelle M.D.   On: 07/03/2018 05:46     Pending Labs Unresulted Labs (From admission, onward)    Start     Ordered   07/04/18 0500  Heparin level (unfractionated)  Daily,   R     07/03/18 0647   07/04/18 0500  CBC  Daily,   R     07/03/18 0647   07/03/18 1500  Heparin level (unfractionated)  Once-Timed,   STAT     07/03/18 0647   07/03/18 0528  Novel Coronavirus,NAA,(SEND-OUT TO REF LAB - TAT 24-48 hrs); Hosp Order  (Asymptomatic Patients Labs)  ONCE - STAT,   STAT    Question:  Rule Out  Answer:  Yes   07/03/18 0527          Vitals/Pain Today's Vitals   07/03/18 0800 07/03/18 0815 07/03/18 0830 07/03/18 0845  BP: 112/90 (!) 114/96 100/84 114/87  Pulse:      Resp: 16 (!) 33 (!) 27 (!) 26  Temp:      TempSrc:      SpO2:      Weight:      Height:      PainSc:        Isolation Precautions No active isolations  Medications Medications  diltiazem (CARDIZEM) 100 mg in dextrose  5% 139mL (1 mg/mL) infusion (15 mg/hr Intravenous Rate/Dose Change 07/03/18 0846)  heparin ADULT infusion 100 units/mL (25000 units/252mL sodium chloride 0.45%) (900 Units/hr Intravenous New Bag/Given 07/03/18 0718)  adenosine (ADENOCARD) 6 MG/2ML injection 6 mg (6 mg Intravenous Given 07/03/18 0510)  adenosine (ADENOCARD) 6 MG/2ML injection 6 mg (6 mg Intravenous Given 07/03/18 0515)  furosemide (LASIX) injection 60 mg (60 mg Intravenous Given 07/03/18 0620)  heparin bolus via infusion 3,000 Units (3,000 Units Intravenous Bolus from Bag 07/03/18 0722)    Mobility walks Low fall risk   Focused Assessments Cardiac Assessment Handoff:  Cardiac Rhythm: Atrial flutter Lab Results  Component Value Date   TROPONINI 0.04 (HH) 07/03/2018   Lab Results  Component Value Date   DDIMER 1.16 (H) 04/29/2018   Does the Patient currently have chest pain? No     R Recommendations: See Admitting Provider Note  Report given to:  Chat, RN  Additional Notes:  Cardizem drip, heparin drip

## 2018-07-03 NOTE — Progress Notes (Signed)
DR , Harrington Challenger made aware of pending corona virus done in the ED.claimed to put pt on contact and droplet prec.Marland Kitchen

## 2018-07-03 NOTE — Evaluation (Signed)
Physical Therapy Evaluation Patient Details Name: Sabrina Holland MRN: [id number] DOB: December 06, 1970 Today's Date: 07/03/2018   History of Present Illness  Patient is a 48 y/o female presenting to the ED on 07/03/18 with SOB. Past medical history significant of TIA; HTN; COPD; breast cancer s/p mastectomy; and polysubstance abuse. Found to be in atrial flutter with RVR, Patient also is found to be in CHF. Admitted for further work-up.    Clinical Impression  Patient admitted with the above listed diagnosis. Per nursing patient has been ambulatory in room without issue and clearing PT to evaluate patient. Patient demonstrating Mod I with transfers and general supervision for safety with walking to/from sink without LOB or use of AD. HR ~110 with mobility and only requests breathing treatment. PT to follow acutely to ensure safety and independence with progressive mobility. Do not anticipate need for skilled PT services at discharge.      Follow Up Recommendations No PT follow up;Supervision - Intermittent    Equipment Recommendations  None recommended by PT    Recommendations for Other Services       Precautions / Restrictions Precautions Precautions: Fall Restrictions Weight Bearing Restrictions: No      Mobility  Bed Mobility Overal bed mobility: Modified Independent                Transfers Overall transfer level: Needs assistance Equipment used: None Transfers: Sit to/from Stand Sit to Stand: Supervision            Ambulation/Gait Ambulation/Gait assistance: Supervision Gait Distance (Feet): 15 Feet Assistive device: None Gait Pattern/deviations: Step-through pattern Gait velocity: decreased   General Gait Details: steady gait in room - no LOB or instability; HR up to 112 with mobility  Stairs            Wheelchair Mobility    Modified Rankin (Stroke Patients Only)       Balance Overall balance assessment: Mild deficits observed, not  formally tested                                           Pertinent Vitals/Pain Pain Assessment: Faces Faces Pain Scale: Hurts little more Pain Location: rib soreness Pain Descriptors / Indicators: Aching;Guarding Pain Intervention(s): Limited activity within patient's tolerance;Monitored during session;Repositioned;Patient requesting pain meds-RN notified    Home Living Family/patient expects to be discharged to:: Private residence Living Arrangements: Children Available Help at Discharge: Family Type of Home: House Home Access: Level entry     Home Layout: One level Home Equipment: None      Prior Function Level of Independence: Independent         Comments: does not drive     Hand Dominance        Extremity/Trunk Assessment   Upper Extremity Assessment Upper Extremity Assessment: Overall WFL for tasks assessed    Lower Extremity Assessment Lower Extremity Assessment: Generalized weakness    Cervical / Trunk Assessment Cervical / Trunk Assessment: Normal  Communication   Communication: No difficulties  Cognition Arousal/Alertness: Awake/alert Behavior During Therapy: WFL for tasks assessed/performed Overall Cognitive Status: Within Functional Limits for tasks assessed                                        General Comments      Exercises  Assessment/Plan    PT Assessment Patient needs continued PT services  PT Problem List Decreased strength;Decreased activity tolerance;Decreased balance;Decreased mobility;Decreased safety awareness       PT Treatment Interventions DME instruction;Gait training;Functional mobility training;Therapeutic activities;Therapeutic exercise;Balance training;Neuromuscular re-education;Patient/family education    PT Goals (Current goals can be found in the Care Plan section)  Acute Rehab PT Goals Patient Stated Goal: feel better PT Goal Formulation: With patient Time For Goal  Achievement: 07/17/18 Potential to Achieve Goals: Good    Frequency Min 3X/week   Barriers to discharge        Co-evaluation               AM-PAC PT "6 Clicks" Mobility  Outcome Measure Help needed turning from your back to your side while in a flat bed without using bedrails?: None Help needed moving from lying on your back to sitting on the side of a flat bed without using bedrails?: None Help needed moving to and from a bed to a chair (including a wheelchair)?: None Help needed standing up from a chair using your arms (e.g., wheelchair or bedside chair)?: A Little Help needed to walk in hospital room?: A Little Help needed climbing 3-5 steps with a railing? : A Little 6 Click Score: 21    End of Session   Activity Tolerance: Patient tolerated treatment well Patient left: in bed;with call bell/phone within reach(lab entering room) Nurse Communication: Mobility status;Patient requests pain meds PT Visit Diagnosis: Unsteadiness on feet (R26.81);Muscle weakness (generalized) (M62.81)    Time: 2025-4270 PT Time Calculation (min) (ACUTE ONLY): 15 min   Charges:   PT Evaluation $PT Eval Moderate Complexity: 1 Mod          Lanney Gins, PT, DPT Supplemental Physical Therapist 07/03/18 12:36 PM Pager: [phone] Office: [phone]

## 2018-07-03 NOTE — ED Provider Notes (Signed)
TIME SEEN: 4:49 AM  CHIEF COMPLAINT: Chest pain and shortness of breath  HPI: Patient is a 48 year old female with history of hypertension, COPD, CHF, cocaine abuse, previous breast cancer status post mastectomy who presents to the emergency department with 3 days of shortness of breath and chest discomfort from "breathing so hard".  States she has not used cocaine in 2 months but states "I have been handling it".  Denies any fevers or cough.  States she does not feel that this is her COPD.  States she has been using inhalers and nebulizer treatments at home.  Last used a nebulizer treatment at 3 AM.  States that she has been taking her furosemide as prescribed.  States she takes "6 pills a day".  It appears she is prescribed 60 mg twice daily.  She is also on metaxalone and spironolactone.  She reports compliance with her medications.  Given aspirin with EMS in route.  Echo November 17th 2019:  Study Conclusions  - Left ventricle: The cavity size was mildly dilated. Wall   thickness was normal. Systolic function was severely reduced. The   estimated ejection fraction was in the range of 25% to 30%.   Diffuse hypokinesis. - Mitral valve: There was mild regurgitation. - Left atrium: The atrium was mildly dilated. - Atrial septum: There was increased thickness of the septum,   consistent with lipomatous hypertrophy. - Pulmonary arteries: PA peak pressure: 43 mm Hg (S). - Impressions: EF similar to that estimated in June 2019.  Impressions:  - EF similar to that estimated in June 2019.  ROS: See HPI Constitutional: no fever  Eyes: no drainage  ENT: no runny nose   Cardiovascular:   chest pain  Resp: SOB  GI: no vomiting GU: no dysuria Integumentary: no rash  Allergy: no hives  Musculoskeletal: no leg swelling  Neurological: no slurred speech ROS otherwise negative  PAST MEDICAL HISTORY/PAST SURGICAL HISTORY:  Past Medical History:  Diagnosis Date  . Breast cancer ([place])   .  Breast cancer, stage 2 ([place]) 02/23/2011  . COPD (chronic obstructive pulmonary disease) ([place])   . Hypertension   . TIA (transient ischemic attack) 11/25/2011   "first time" (11/25/2011)    MEDICATIONS:  Prior to Admission medications   Medication Sig Start Date End Date Taking? Authorizing Provider  albuterol (PROVENTIL HFA) 108 (90 Base) MCG/ACT inhaler Inhale 1-2 puffs into the lungs every 6 (six) hours as needed for up to 1 day for wheezing or shortness of breath (cough). 06/18/18 07/02/18  Erlene Quan, PA-C  albuterol (PROVENTIL) (2.5 MG/3ML) 0.083% nebulizer solution Take 3 mLs (2.5 mg total) by nebulization every 6 (six) hours as needed for wheezing or shortness of breath. 06/18/18   Erlene Quan, PA-C  amLODipine (NORVASC) 5 MG tablet Take 1 tablet (5 mg total) by mouth daily. 03/05/18 07/02/18  Gildardo Pounds, NP  aspirin EC 81 MG EC tablet Take 1 tablet (81 mg total) by mouth daily. 06/24/17   Reyne Dumas, MD  atorvastatin (LIPITOR) 40 MG tablet Take 1 tablet (40 mg total) by mouth daily at 6 PM. 03/05/18   Gildardo Pounds, NP  budesonide-formoterol (SYMBICORT) 80-4.5 MCG/ACT inhaler Inhale 2 puffs into the lungs 2 (two) times daily. 05/08/18   Gildardo Pounds, NP  cyclobenzaprine (FLEXERIL) 10 MG tablet Take 1 tablet (10 mg total) by mouth 2 (two) times daily as needed for up to 10 doses for muscle spasms. 04/02/18   Curatolo, Adam, DO  diclofenac sodium (  VOLTAREN) 1 % GEL Apply 2 g topically 4 (four) times daily. 04/23/18   Gildardo Pounds, NP  ferrous sulfate 325 (65 FE) MG tablet Take 1 tablet (325 mg total) by mouth 2 (two) times daily with a meal. 04/18/18   Norval Morton, MD  fluticasone (FLONASE) 50 MCG/ACT nasal spray Place 2 sprays into both nostrils daily. 12/05/17   Eugenie Filler, MD  furosemide (LASIX) 20 MG tablet Take 3 tablets (60 mg total) by mouth 2 (two) times daily for 30 days. 04/23/18 05/29/18  Gildardo Pounds, NP  lidocaine (LIDODERM) 5 % Place 1 patch onto the skin  daily. Remove & Discard patch within 12 hours or as directed by MD 04/29/18   Darlin Drop P, PA-C  lisinopril (PRINIVIL,ZESTRIL) 20 MG tablet Take 1 tablet (20 mg total) by mouth daily. 03/05/18 06/03/18  Gildardo Pounds, NP  metolazone (ZAROXOLYN) 2.5 MG tablet Take 1 tablet on Wednesday and Saturdays 06/18/18   Erlene Quan, PA-C  Multiple Vitamin (MULTIVITAMIN WITH MINERALS) TABS tablet Take 1 tablet by mouth daily.    [provider]  nicotine (NICODERM CQ - DOSED IN MG/24 HOURS) 14 mg/24hr patch Place 1 patch (14 mg total) onto the skin daily. 03/05/18   Gildardo Pounds, NP  omeprazole (PRILOSEC) 20 MG capsule Take 1 capsule (20 mg total) by mouth daily. 03/05/18 07/02/18  Gildardo Pounds, NP  senna-docusate (SENOKOT-S) 8.6-50 MG tablet Take 1 tablet by mouth 2 (two) times daily as needed for mild constipation. 04/18/18   Norval Morton, MD  spironolactone (ALDACTONE) 25 MG tablet Take 1 tablet (25 mg total) by mouth daily. 04/23/18 07/22/18  Gildardo Pounds, NP  tiotropium (SPIRIVA HANDIHALER) 18 MCG inhalation capsule Place 1 capsule (18 mcg total) into inhaler and inhale daily. 03/05/18 03/05/19  Gildardo Pounds, NP    ALLERGIES:  Allergies  Allergen Reactions  . Penicillins Other (See Comments)    Pt had slight throat swelling Has patient had a PCN reaction causing immediate rash, facial/tongue/throat swelling, SOB or lightheadedness with hypotension: yes Has patient had a PCN reaction causing severe rash involving mucus membranes or skin necrosis: No Has patient had a PCN reaction that required hospitalization No Has patient had a PCN reaction occurring within the last 10 years: No If all of the above answers are "NO", then may proceed with Cephalosporin use.     SOCIAL HISTORY:  Social History   Tobacco Use  . Smoking status: Current Some Day Smoker    Packs/day: 0.00    Years: 20.00    Pack years: 0.00    Types: Cigarettes  . Smokeless tobacco: Never Used  . Tobacco  comment: 1 or 2 cigarettes a day- approx 1 pack week  Substance Use Topics  . Alcohol use: Yes    Alcohol/week: 12.0 standard drinks    Types: 12 Cans of beer per week    Comment: occasionally    FAMILY HISTORY: Family History  Problem Relation Age of Onset  . COPD Father   . Sickle cell anemia Cousin        maternal cousin    EXAM: BP 104/89   Temp (!) 97.4 F (36.3 C) (Oral)   Resp (!) 27   LMP 02/18/2015 (Exact Date) Comment: MAYBE ONCE A YEAR HR 145 Sat 99% on RA CONSTITUTIONAL: Alert and oriented and responds appropriately to questions.  Chronically ill-appearing HEAD: Normocephalic EYES: Conjunctivae clear, pupils appear equal, EOMI ENT: normal nose; moist  mucous membranes NECK: Supple, no meningismus, no nuchal rigidity, no LAD, JVD to the level of the mandible when sitting upright in bed CARD: Regular and tachycardic; S1 and S2 appreciated; no murmurs, no clicks, no rubs, no gallops RESP: Patient appears short of breath talking and is tachypneic, breath sounds clear and equal bilaterally; no wheezes, no rhonchi, no rales, no hypoxia or respiratory distress, speaking full sentences ABD/GI: Normal bowel sounds; non-distended; soft, non-tender, no rebound, no guarding, no peritoneal signs, no hepatosplenomegaly BACK:  The back appears normal and is non-tender to palpation, there is no CVA tenderness EXT: Normal ROM in all joints; non-tender to palpation; no edema; normal capillary refill; no cyanosis, no calf tenderness or swelling    SKIN: Normal color for age and race; warm; no rash NEURO: Moves all extremities equally PSYCH: The patient's mood and manner are appropriate. Grooming and personal hygiene are appropriate.  MEDICAL DECISION MAKING: Patient here with chest pain or shortness of breath.  Lungs are currently clear.  Doubt COPD exacerbation.  She has JVD on exam but no peripheral edema.  No infectious symptoms.  Will obtain labs, chest x-ray.  She has a narrow  complex regular tachycardia on the cardiac monitor.  It is extremely regular staying at exactly 145 bpm.  I do not think this is sinus tachycardia.  I think this is likely a flutter versus SVT.  She has no known history of arrhythmia per her report.  Will give IV adenosine.  No change with vagal maneuvers.  ED PROGRESS: 5:30 AM  No change in her heart rate at all after 6 mg of adenosine and then 12 mg of adenosine.  Cardiology on call Dr. Kalman Shan has reviewed patient's EKG and thinks that this looks like 2:1 atrial flutter.  Agrees that electrocardioversion likely not appropriate given patient mentating normally and otherwise hemodynamically stable and that she would need a TEE first.  She is not anticoagulated.  Will start IV diltiazem.   Patient's electrolytes within normal limits.  Troponin is 0.04.  Chest x-ray shows cardiomegaly without overt edema but patient's BNP is elevated greater than 2500 and she has dyspnea on exam with tachypnea and JVD.  Will give IV diuretics here in the emergency department.  Will discuss with medicine for admission for heart rate control, diuresis, serial troponins.   6:40 AM Discussed patient's case with hospitalist, Dr. Hal Hope.  I have recommended admission and patient (and family if present) agree with this plan. Admitting physician will place admission orders.   Hospitalist has requested that we start anticoagulation for her atrial flutter.  I have ordered IV heparin.  I reviewed all nursing notes, vitals, pertinent previous records, EKGs, lab and urine results, imaging (as available).     EKG Interpretation  Date/Time:  Tuesday July 03 2018 04:45:11 EDT Ventricular Rate:  145 PR Interval:    QRS Duration: 151 QT Interval:  323 QTC Calculation: 502 R Axis:   -161 Text Interpretation:  Ectopic atrial tachycardia, unifocal Consider right atrial enlargement RBBB and LPFB No significant change since last tracing Confirmed by Swayzie Choate, Cyril Mourning [phone]) on  07/03/2018 4:47:43 AM        CRITICAL CARE Performed by: Cyril Mourning Crisanto Nied   Total critical care time: 55 minutes  Critical care time was exclusive of separately billable procedures and treating other patients.  Critical care was necessary to treat or prevent imminent or life-threatening deterioration.  Critical care was time spent personally by me on the following activities: development of treatment  plan with patient and/or surrogate as well as nursing, discussions with consultants, evaluation of patient's response to treatment, examination of patient, obtaining history from patient or surrogate, ordering and performing treatments and interventions, ordering and review of laboratory studies, ordering and review of radiographic studies, pulse oximetry and re-evaluation of patient's condition.    Shmuel Girgis, Delice Bison, DO 07/03/18 [phone]

## 2018-07-03 NOTE — Progress Notes (Signed)
Refusing blood drawing and cont blood pressure , md aware .

## 2018-07-03 NOTE — Progress Notes (Signed)
EKG CRITICAL VALUE     12 lead EKG performed.  Critical value noted.  chet, RN notified.   Genia Plants, CCT 07/03/2018 12:42 PM

## 2018-07-03 NOTE — Consult Note (Addendum)
Cardiology Consultation:   Patient ID: Sabrina Holland; [id number]; Feb 26, 1970   Admit date: 07/03/2018 Date of Consult: 07/03/2018  Primary Care Provider: Gildardo Pounds, NP Primary Cardiologist: Sanda Klein, MD Primary Electrophysiologist:  None  Chief Complaint: shortness of breath, chest tightness  Patient Profile:   Sabrina Holland is a 48 y.o. 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 Lorin Mercy.  History of Present Illness:   She had a normal echocardiogram in 2013 as part of a work-up for TIA. She has never had an ischemic evaluation. In 06/2017 she was admitted with SOB and initially had left AMA. However, she returned and was found to have severe CHF with EF 25-30%, grade 3 DD. She was diuresed and started on guideline directed therapy. She did follow up in the months to follow but this was limited by her social situation and recurrent cocaine use. She has had other admissions for CHF and respiratory failure. Last echo 11/2017 showed EF 25-30%, mild MR, mild LAE, PASP 70mmHg. She was admitted in 02/2018 for recurrent HF. She was not taking Lasix as prescribed due to frequent urination at work. She refused coronary CT at that time. Cath has not been pursued due to concern for compliance. She has subsequently followed up in telehealth with adjustment in meds. Dr. Sallyanne Kuster has suggested avoidance of beta blocker given continued cocaine use. She presented yesterday to the ER with SOB and chest tightness and was found to be in atrial flutter with RVR. She was started on IV heparin and admitted to IM service. EKG from 5/4 also appears suspicious for  atrial flutter as well.  She reports weeks of progressive DOE and feeling weak and unwell. She has also had some chest tightness and early satiety. Her belly feels tight. She says she cannot tell if she has orthopnea or not. She reports weight has been consistently 127lb at home. She was not really aware that her heart was racing. She admits she may have run out of some of her medicines or been missing doses. She is in atrial flutter with RVR.  Labs show microcytosis but normal Hgb, K 4.0, AKI with Cr 1.35, troponin 0.04, BNP 2450, normal TSH, Covid pending, positive cocaine/THC, negative ETOH.  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 She denies any recent ingestion but thinks perhaps she might have gotten some in her system from the handling of the product. She was started on a diltiazem drip and given 60mg  IV Lasix. HR improved to the 90s-1teens Blood pressure is soft at 91/74.  When I walked in the patient was initially on a conversation with her daughter insisting she talk to her medical team about rehab. The patient is begging for help at this point. She is caught in a vicious cycle she cannot break out of.  Past Medical History:  Diagnosis Date   Atrial flutter ([place]) 07/03/2018   Breast cancer, stage 2 ([place]) 02/23/2011   Chronic combined systolic and diastolic CHF (congestive heart failure) (HCC)    CKD (chronic kidney disease), stage II  Cocaine use    COPD (chronic obstructive pulmonary disease) (HCC)    Homelessness    Hypertension    Polysubstance abuse ([place])    Poor social situation    TIA (transient ischemic attack) 11/25/2011   "first time" (11/25/2011)    Past Surgical History:  Procedure Laterality Date   BREAST BIOPSY  2007   left   MASTECTOMY  09/2005   left   TONSILLECTOMY AND ADENOIDECTOMY     "when I was little" (11/25/2011)   TUBAL LIGATION  1993     Inpatient  Medications: Scheduled Meds:  aspirin EC  81 mg Oral Daily   atorvastatin  40 mg Oral q1800   fluticasone  2 spray Each Nare Daily   furosemide  40 mg Intravenous Q12H   mometasone-formoterol  2 puff Inhalation BID   multivitamin with minerals  1 tablet Oral Daily   pantoprazole  40 mg Oral Daily   sodium chloride flush  3 mL Intravenous Q12H   umeclidinium bromide  1 puff Inhalation Daily   Continuous Infusions:  sodium chloride     diltiazem (CARDIZEM) infusion 10 mg/hr (07/03/18 1039)   heparin 900 Units/hr (07/03/18 0718)   PRN Meds: sodium chloride, acetaminophen, albuterol, cyclobenzaprine, ondansetron (ZOFRAN) IV, senna-docusate, sodium chloride flush  Home Meds: Prior to Admission medications   Medication Sig Start Date End Date Taking? Authorizing Provider  albuterol (PROVENTIL HFA) 108 (90 Base) MCG/ACT inhaler Inhale 1-2 puffs into the lungs every 6 (six) hours as needed for up to 1 day for wheezing or shortness of breath (cough). 06/18/18 07/03/27 Yes Kilroy, Luke K, PA-C  albuterol (PROVENTIL) (2.5 MG/3ML) 0.083% nebulizer solution Take 3 mLs (2.5 mg total) by nebulization every 6 (six) hours as needed for wheezing or shortness of breath. 06/18/18  Yes Kilroy, Luke K, PA-C  amLODipine (NORVASC) 5 MG tablet Take 1 tablet (5 mg total) by mouth daily. 03/05/18 07/03/27 Yes Gildardo Pounds, NP  aspirin EC 81 MG EC tablet Take 1 tablet (81 mg total) by mouth daily. 06/24/17  Yes Reyne Dumas, MD  atorvastatin (LIPITOR) 40 MG tablet Take 1 tablet (40 mg total) by mouth daily at 6 PM. 03/05/18  Yes Gildardo Pounds, NP  budesonide-formoterol (SYMBICORT) 80-4.5 MCG/ACT inhaler Inhale 2 puffs into the lungs 2 (two) times daily. 05/08/18  Yes Gildardo Pounds, NP  cyclobenzaprine (FLEXERIL) 10 MG tablet Take 1 tablet (10 mg total) by mouth 2 (two) times daily as needed for up to 10 doses for muscle spasms. 04/02/18  Yes Curatolo, Adam, DO  diclofenac sodium (VOLTAREN) 1 % GEL Apply  2 g topically 4 (four) times daily. 04/23/18  Yes Gildardo Pounds, NP  ferrous sulfate 325 (65 FE) MG tablet Take 1 tablet (325 mg total) by mouth 2 (two) times daily with a meal. 04/18/18  Yes Smith, Rondell A, MD  fluticasone (FLONASE) 50 MCG/ACT nasal spray Place 2 sprays into both nostrils daily. 12/05/17  Yes Eugenie Filler, MD  furosemide (LASIX) 20 MG tablet Take 3 tablets (60 mg total) by mouth 2 (two) times daily for 30 days. 04/23/18 07/03/27 Yes Gildardo Pounds, NP  lidocaine (LIDODERM) 5 % Place 1 patch onto the skin daily. Remove & Discard patch within 12 hours or as directed by MD 04/29/18  Yes Jerilee Hoh, Ana P, PA-C  lisinopril (PRINIVIL,ZESTRIL) 20 MG tablet Take 1 tablet (20 mg total) by mouth daily. 03/05/18 07/03/27 Yes Gildardo Pounds, NP  metolazone (ZAROXOLYN) 2.5 MG tablet Take  1 tablet on Wednesday and Saturdays Patient taking differently: Take 2.5 mg by mouth See admin instructions. Take 1 tablet on Wednesday and Saturdays 06/18/18  Yes Kilroy, Doreene Burke, PA-C  Multiple Vitamin (MULTIVITAMIN WITH MINERALS) TABS tablet Take 1 tablet by mouth daily.   Yes [provider]  omeprazole (PRILOSEC) 20 MG capsule Take 1 capsule (20 mg total) by mouth daily. 03/05/18 07/03/27 Yes Gildardo Pounds, NP  senna-docusate (SENOKOT-S) 8.6-50 MG tablet Take 1 tablet by mouth 2 (two) times daily as needed for mild constipation. 04/18/18  Yes Norval Morton, MD  spironolactone (ALDACTONE) 25 MG tablet Take 1 tablet (25 mg total) by mouth daily. 04/23/18 07/22/18 Yes Gildardo Pounds, NP  tiotropium (SPIRIVA HANDIHALER) 18 MCG inhalation capsule Place 1 capsule (18 mcg total) into inhaler and inhale daily. 03/05/18 03/05/19 Yes Gildardo Pounds, NP    Allergies:    Allergies  Allergen Reactions   Penicillins Other (See Comments)    Pt had slight throat swelling Has patient had a PCN reaction causing immediate rash, facial/tongue/throat swelling, SOB or lightheadedness with hypotension: yes Has  patient had a PCN reaction causing severe rash involving mucus membranes or skin necrosis: No Has patient had a PCN reaction that required hospitalization No Has patient had a PCN reaction occurring within the last 10 years: No If all of the above answers are "NO", then may proceed with Cephalosporin use.     Social History:   Social History   Socioeconomic History   Marital status: Single    Spouse name: Not on file   Number of children: 4   Years of education: Not on file   Highest education level: Not on file  Occupational History   Occupation: trying to get disability    Employer: [employer]  Social Needs   Financial resource strain: Not on file   Food insecurity    Worry: Not on file    Inability: Not on file   Transportation needs    Medical: Not on file    Non-medical: Not on file  Tobacco Use   Smoking status: Current Some Day Smoker    Packs/day: 5.00    Years: 30.00    Pack years: 150.00    Types: Cigarettes   Smokeless tobacco: Never Used   Tobacco comment: 1 or 2 cigarettes a day- approx 1 pack week  Substance and Sexual Activity   Alcohol use: Not Currently    Alcohol/week: 12.0 standard drinks    Types: 12 Cans of beer per week    Comment: last drink was 5/19   Drug use: Yes    Types: Marijuana, Cocaine    Comment: daily marijuana; last use of cocaine 05/02/18 but selling it   Sexual activity: Not Currently    Birth control/protection: Surgical  Lifestyle   Physical activity    Days per week: Not on file    Minutes per session: Not on file   Stress: Not on file  Relationships   Social connections    Talks on phone: Not on file    Gets together: Not on file    Attends religious service: Not on file    Active member of club or organization: Not on file    Attends meetings of clubs or organizations: Not on file    Relationship status: Not on file   Intimate partner violence    Fear of current or ex partner: Not on file     Emotionally abused: Not on file  Physically abused: Not on file    Forced sexual activity: Not on file  Other Topics Concern   Not on file  Social History Narrative   ** Merged History Encounter **        Family History:   The patient's family history includes COPD in her father; Sickle cell anemia in her cousin.  ROS:  Please see the history of present illness. All other ROS reviewed and negative.     Physical Exam/Data:   Vitals:   07/03/18 0900 07/03/18 0915 07/03/18 0930 07/03/18 1030  BP: 107/80 (!) 112/96 103/89 91/74  Pulse:    (!) 48  Resp: (!) 26 (!) 26 (!) 25 17  Temp:    (!) 97.4 F (36.3 C)  TempSrc:    Oral  SpO2:    97%  Weight:      Height:        Intake/Output Summary (Last 24 hours) at 07/03/2018 1234 Last data filed at 07/03/2018 1200 Gross per 24 hour  Intake 44.38 ml  Output --  Net 44.38 ml   Last 3 Weights 07/03/2018 07/02/2018 05/14/2018  Weight (lbs) 126 lb 15.8 oz 127 lb 132 lb  Weight (kg) 57.6 kg 57.607 kg 59.875 kg    Body mass index is 21.13 kg/m.  General: Malnourished appearing AAF in no acute distress. Head: Normocephalic, atraumatic, sclera non-icteric, no xanthomas, nares are without discharge.  Neck: Negative for carotid bruits. JVD elevated to earlobe Lungs: Clear bilaterally to auscultation without wheezes, rales, or rhonchi. Breathing is unlabored. Heart: irregularly irregular with S1 S2, + S3. No murmurs or rubs appreciated. Abdomen: Soft, non-tender, rounded with normoactive bowel sounds. No hepatomegaly. No rebound/guarding. No obvious abdominal masses. Msk:  Strength and tone appear normal for age. Extremities: No clubbing or cyanosis. Trace edema.  Distal pedal pulses are 2+ and equal bilaterally. Neuro: Alert and oriented X 3. No facial asymmetry. No focal deficit. Moves all extremities spontaneously. Psych: Tearful, emotional   EKG:  The EKG was personally reviewed and demonstrates atrial flutter with RVR with generally  unusual appearance similar to prior tracing  Laboratory Data:  Chemistry Recent Labs  Lab 07/03/18 0501  NA 137  K 4.0  CL 99  CO2 23  GLUCOSE 111*  BUN 26*  CREATININE 1.35*  CALCIUM 9.6  GFRNONAA 46*  GFRAA 53*  ANIONGAP 15    Recent Labs  Lab 07/03/18 0501  PROT 7.3  ALBUMIN 3.5  AST 31  ALT 17  ALKPHOS 79  BILITOT 3.7*   Hematology Recent Labs  Lab 07/03/18 0501  WBC 5.8  RBC 5.72*  HGB 13.4  HCT 42.4  MCV 74.1*  MCH 23.4*  MCHC 31.6  RDW 27.1*  PLT 241   Cardiac Enzymes Recent Labs  Lab 07/03/18 0501  TROPONINI 0.04*   No results for input(s): TROPIPOC in the last 168 hours.  BNP Recent Labs  Lab 07/03/18 0503  BNP 2,450.2*    DDimer No results for input(s): DDIMER in the last 168 hours.  Radiology/Studies:  Dg Chest Portable 1 View  Result Date: 07/03/2018 CLINICAL DATA:  Shortness of breath for 2 days. Chest tightness. Progressive symptoms. EXAM: PORTABLE CHEST 1 VIEW COMPARISON:  Two-view chest x-ray 05/21/2018 FINDINGS: Heart is enlarged. There is no edema or effusion. Defibrillator pads are in place. Heart size and pads obscure the retrocardiac space. No focal airspace disease is present. Mild curvature of the thoracic spine is stable. The visualized soft tissues and bony thorax are otherwise  unremarkable. IMPRESSION: 1. Stable cardiomegaly without failure. 2. No acute cardiopulmonary disease. Electronically Signed   By: San Morelle M.D.   On: 07/03/2018 05:46    Assessment and Plan:   1. Atrial flutter RVR with CHADSVASC 5 for CHF, HTN, TIA, female (unknown if vascular disease) - difficult situation. Unable to use BB given ongoing cocaine use. Diltiazem is doing OK job of controlling HR, less ideal given her cardiomyopathy but really our best option right now. Digoxin can be considered but need to watch where renal function goes. Regarding anticoagulation, she will certainly require social assistance for treatment. Further  complicating matters is her history of episodic med noncompliance making cardioversion a less appealing option. Furthermore, her atrial flutter will likely be a challenging arrhythmia to control unless her social habits and situation change. Will discuss further management with Dr. Harrington Challenger.  2. Acute on chronic combined CHF / cardiomyopathy of unconfirmed etiology - vitals suggestive of shocky physiology but her extremities are warm. Hold ACEI, spiro, metolazone for now. No BB given active cocaine use. OK to diurese gently as BP tolerates. Consider having AHF team see in AM. May be good resource given patient's poor social situation. Repeat echo pending to reassess LVEF. I doubt there has been any improvement.   3. Recurrent suspected polysubstance abuse with poor social situation - despite her underlying medical issues treatment will be futile if not addressed. Would strongly consider inpatient rehab upon discharge. Social work consult. The patient is begging for help.  4.  AKI on CKD II - likely driven by cardiorenal state and tachycardia. Hold lisinopril, spironolactone and metolazone for now given low BP.    5.  COVID  Test is pending   Pt with potential high risk exposure   Isolate    For questions or updates, please contact [facility] Please consult [email or web address] for contact info under Cardiology/STEMI.    Signed, Charlie Pitter, PA-C  07/03/2018 12:34 PM   Pt seen and examined  I agree with findings of D DUnn above  Pt is a 48 yo with hx of polysubstance abuse, chronic systolic CHF, HTN, MR, HL  And atririal flutter   Presents with worsening SOB  On exam, Pt is a chronically malnourished 48 yo appearing older than stated age Neck with increased JVP  Cardiac exam:  Irreg irreg   No S3   No murmur Lungs are rel clear  Abd is nontender   Supple Ext are with Tr edema  EKG with atrial flutter with RVR  1  Acute on chronic systolic cHF   Exacerbated by atrial flutter  + probable  noncompliance   Diurese with Lasix  Cancel echo   I do not think it will be mproved   May be worse   WIll not change Rx    2  Atrial flutter   Rate control only   Unfortunately with continued cocaine use cannot use  B BLocker  Relegated to Ca blocker for rate control  Agree with D Dunn, pt needs rehab after leaves the hospital  Dorris Carnes MD

## 2018-07-03 NOTE — ED Notes (Signed)
Zoll pads placed 

## 2018-07-04 DIAGNOSIS — I5041 Acute combined systolic (congestive) and diastolic (congestive) heart failure: Secondary | ICD-10-CM

## 2018-07-04 DIAGNOSIS — J449 Chronic obstructive pulmonary disease, unspecified: Secondary | ICD-10-CM

## 2018-07-04 LAB — TROPONIN I
Troponin I: 0.06 ng/mL (ref <0.03)
Troponin I: 0.06 ng/mL (ref <0.03)

## 2018-07-04 LAB — BASIC METABOLIC PANEL
Anion gap: 13 (ref 5–15)
BUN: 29 mg/dL — ABNORMAL HIGH (ref 6–20)
CO2: 22 mmol/L (ref 22–32)
Calcium: 9.6 mg/dL (ref 8.9–10.3)
Chloride: 98 mmol/L (ref 98–111)
Creatinine, Ser: 1.34 mg/dL — ABNORMAL HIGH (ref 0.44–1.00)
GFR calc Af Amer: 53 mL/min — ABNORMAL LOW (ref >60)
GFR calc non Af Amer: 46 mL/min — ABNORMAL LOW (ref >60)
Glucose, Bld: 130 mg/dL — ABNORMAL HIGH (ref 70–99)
Potassium: 3.5 mmol/L (ref 3.5–5.1)
Sodium: 133 mmol/L — ABNORMAL LOW (ref 135–145)

## 2018-07-04 LAB — CBC WITH DIFFERENTIAL/PLATELET
Abs Immature Granulocytes: 0.03 10*3/uL (ref 0.00–0.07)
Basophils Absolute: 0 10*3/uL (ref 0.0–0.1)
Basophils Relative: 1 %
Eosinophils Absolute: 0 10*3/uL (ref 0.0–0.5)
Eosinophils Relative: 1 %
HCT: 34.9 % — ABNORMAL LOW (ref 36.0–46.0)
Hemoglobin: 11.6 g/dL — ABNORMAL LOW (ref 12.0–15.0)
Immature Granulocytes: 1 %
Lymphocytes Relative: 40 %
Lymphs Abs: 1.9 10*3/uL (ref 0.7–4.0)
MCH: 23.7 pg — ABNORMAL LOW (ref 26.0–34.0)
MCHC: 33.2 g/dL (ref 30.0–36.0)
MCV: 71.2 fL — ABNORMAL LOW (ref 80.0–100.0)
Monocytes Absolute: 0.5 10*3/uL (ref 0.1–1.0)
Monocytes Relative: 11 %
Neutro Abs: 2.3 10*3/uL (ref 1.7–7.7)
Neutrophils Relative %: 46 %
Platelets: 241 10*3/uL (ref 150–400)
RBC: 4.9 MIL/uL (ref 3.87–5.11)
RDW: 26 % — ABNORMAL HIGH (ref 11.5–15.5)
WBC: 4.8 10*3/uL (ref 4.0–10.5)
nRBC: 0.4 % — ABNORMAL HIGH (ref 0.0–0.2)

## 2018-07-04 LAB — LIPID PANEL
Cholesterol: 78 mg/dL (ref 0–200)
HDL: 22 mg/dL — ABNORMAL LOW (ref >40)
LDL Cholesterol: 48 mg/dL (ref 0–99)
Total CHOL/HDL Ratio: 3.5 RATIO
Triglycerides: 40 mg/dL (ref <150)
VLDL: 8 mg/dL (ref 0–40)

## 2018-07-04 LAB — NOVEL CORONAVIRUS, NAA (HOSP ORDER, SEND-OUT TO REF LAB; TAT 18-24 HRS): SARS-CoV-2, NAA: NOT DETECTED

## 2018-07-04 LAB — HIV ANTIBODY (ROUTINE TESTING W REFLEX): HIV Screen 4th Generation wRfx: NONREACTIVE

## 2018-07-04 MED ORDER — POTASSIUM CHLORIDE CRYS ER 10 MEQ PO TBCR
10.0000 meq | EXTENDED_RELEASE_TABLET | Freq: Every day | ORAL | Status: DC
  Administered 2018-07-04: 10 meq via ORAL
  Filled 2018-07-04 (×2): qty 1

## 2018-07-04 MED ORDER — FUROSEMIDE 10 MG/ML IJ SOLN
80.0000 mg | Freq: Once | INTRAMUSCULAR | Status: AC
  Administered 2018-07-04: 80 mg via INTRAVENOUS
  Filled 2018-07-04: qty 8

## 2018-07-04 NOTE — TOC Initial Note (Signed)
Transition of Care [facility]) - Initial/Assessment Note    Patient Details  Name: Sabrina Holland MRN: [id number] Date of Birth: [birth year]  Transition of Care [facility]) CM/SW Contact:    Vinie Sill, [place] Phone Number: 07/04/2018, 3:02 PM  Clinical Narrative:                 CSW visited with the patient at bedside. Patient states she is motivated to stop using drugs. She informed the CSW she attends a local church near her home for NA/AA meetings but she has not found a Publishing copy. Patient expressed she needed a change in her environment and hoped that moving in with her daughter to another area would help. Patient reports she wants to to be here for her children and grandchildren. Patient believed she didn't need the resources at this time but was willing to accept the information. CSW reviewed the substance abuse resources in the community. CSW encouraged the patient to seek NA/AA Sponsor and utilize the resources in the community for assistance.   Patient was thankful for CSW assistance. Patient states no questions or concerns at this time. Clinical Social Worker will sign off for now as social work intervention is no longer needed. Please consult  if new need arises.    Thurmond Butts, LCSWA Clinical Social Worker [phone]     Barriers to Discharge: No Barriers Identified   Patient Goals and CMS Choice        Expected Discharge Plan and Services   In-house Referral: Clinical Social Work     Living arrangements for the past 2 months: Single Family Home                                      Prior Living Arrangements/Services Living arrangements for the past 2 months: Single Family Home Lives with:: Self, Adult Children Patient language and need for interpreter reviewed:: No        Need for Family Participation in Patient Care: Yes (Comment) Care giver support system in place?: Yes (comment)   Criminal Activity/Legal Involvement Pertinent to Current  Situation/Hospitalization: No - Comment as needed  Activities of Daily Living Home Assistive Devices/Equipment: None ADL Screening (condition at time of admission) Patient's cognitive ability adequate to safely complete daily activities?: No Is the patient deaf or have difficulty hearing?: No Does the patient have difficulty seeing, even when wearing glasses/contacts?: No Does the patient have difficulty concentrating, remembering, or making decisions?: No Patient able to express need for assistance with ADLs?: Yes Does the patient have difficulty dressing or bathing?: No Independently performs ADLs?: Yes (appropriate for developmental age) Does the patient have difficulty walking or climbing stairs?: No Weakness of Legs: None Weakness of Arms/Hands: None  Permission Sought/Granted Permission sought to share information with : Family Supports Permission granted to share information with : Yes, Verbal Permission Granted  Share Information with NAME: Loula, Marcella     Permission granted to share info w Relationship: daughter  Permission granted to share info w Contact Information: [phone]  Emotional Assessment Appearance:: Appears older than stated age Attitude/Demeanor/Rapport: Engaged, Self-Confident Affect (typically observed): Accepting, Appropriate, Hopeful Orientation: : Oriented to Self, Oriented to Place, Oriented to  Time, Oriented to Situation Alcohol / Substance Use: Illicit Drugs Psych Involvement: No (comment)  Admission diagnosis:  Acute systolic congestive heart failure (HCC) [I50.21] Atrial flutter with rapid ventricular response [facility]) [I48.92] Patient Active Problem List  Diagnosis Date Noted  . Atrial flutter ([place]) 07/03/2018  . Elevated LFTs 04/18/2018  . Malnutrition of moderate degree 02/27/2018  . Dyspnea 02/26/2018  . Acute on chronic systolic and diastolic heart failure, NYHA class 3 ([place]) 01/20/2018  . Cocaine abuse ([place]) 12/19/2017  . Mixed  hyperlipidemia 12/19/2017  . COPD exacerbation ([place])   . Acute on chronic respiratory failure with hypoxemia ([place]) 12/03/2017  . Cardiomyopathy ([place]) 07/04/2017  . COPD (chronic obstructive pulmonary disease) ([place])   . Chronic combined systolic and diastolic heart failure ([place])   . Anemia 06/20/2017  . Polysubstance abuse ([place]) 11/26/2011  . TIA (transient ischemic attack) 11/25/2011  . Essential hypertension 11/25/2011  . Sinusitis 11/25/2011  . Vitamin B 12 deficiency 07/18/2011  . History of breast cancer 02/23/2011   PCP:  Gildardo Pounds, NP Pharmacy:   [facility], [address] [place] [address] Phone: [phone] Fax: [phone]     Social Determinants of Health (SDOH) Interventions    Readmission Risk Interventions No flowsheet data found.

## 2018-07-04 NOTE — Progress Notes (Signed)
[place] for enoxaparin Indication: atrial fibrillation  Allergies  Allergen Reactions  . Penicillins Other (See Comments)    Pt had slight throat swelling Has patient had a PCN reaction causing immediate rash, facial/tongue/throat swelling, SOB or lightheadedness with hypotension: yes Has patient had a PCN reaction causing severe rash involving mucus membranes or skin necrosis: No Has patient had a PCN reaction that required hospitalization No Has patient had a PCN reaction occurring within the last 10 years: No If all of the above answers are "NO", then may proceed with Cephalosporin use.     Patient Measurements: Height: 5\' 5"  (165.1 cm) Weight: 127 lb 13.9 oz (58 kg) IBW/kg (Calculated) : 57  Vital Signs: Temp: 98 F (36.7 C) (06/17 0336) Temp Source: Oral (06/17 0336) BP: 102/85 (06/17 0336)  Labs: Recent Labs    07/03/18 0501 07/03/18 1818 07/04/18 0018  HGB 13.4  --   --   HCT 42.4  --   --   PLT 241  --   --   CREATININE 1.35*  --  1.34*  TROPONINI 0.04* 0.06* 0.06*    Estimated Creatinine Clearance: 45.2 mL/min (A) (by C-G formula based on SCr of 1.34 mg/dL (H)).    [name] c/o SOB x2d now associated w/ chest tightness, found to be in Afib, to transition to Lovenox as she is refusing lab draws.  Goal of Therapy:  Monitor platelets by anticoagulation protocol: Yes   Plan:  -Enoxaparin 60mg  SQ BID -Monitor CBC, S/Sx bleeding   Arrie Senate, PharmD, BCPS Clinical Pharmacist Please check AMION for all [place] numbers 07/04/2018

## 2018-07-04 NOTE — Progress Notes (Signed)
Pt educated on fluid restriction, not tolerating well. RN attempting to try cups of ice instead of full cups of water. Will continue to encourage self help and regulation/limitatioin of oral intake as she continues to c/o SOB. SpO2 96% on room air, pt wears nasal cannula on forehead. RR 20, HR 94.   Will continue to monitor.

## 2018-07-04 NOTE — Progress Notes (Addendum)
Progress Note  Patient Name: Sabrina Holland Date of Encounter: 07/04/2018  Primary Cardiologist: Sanda Klein, MD   Subjective   Still SOB  Hurts to breathe    Inpatient Medications    Scheduled Meds: . aspirin EC  81 mg Oral Daily  . atorvastatin  40 mg Oral q1800  . enoxaparin (LOVENOX) injection  60 mg Subcutaneous Q12H  . fluticasone  2 spray Each Nare Daily  . furosemide  40 mg Intravenous Q12H  . mometasone-formoterol  2 puff Inhalation BID  . multivitamin with minerals  1 tablet Oral Daily  . pantoprazole  40 mg Oral Daily  . sodium chloride flush  3 mL Intravenous Q12H  . umeclidinium bromide  1 puff Inhalation Daily   Continuous Infusions: . sodium chloride    . diltiazem (CARDIZEM) infusion 10 mg/hr (07/04/18 0300)   PRN Meds: sodium chloride, acetaminophen, albuterol, cyclobenzaprine, ondansetron (ZOFRAN) IV, senna-docusate, sodium chloride flush   Vital Signs    Vitals:   07/03/18 1935 07/03/18 2332 07/04/18 0336 07/04/18 0446  BP: 99/84 117/84 102/85   Pulse:      Resp: 19 (!) 21 (!) 32   Temp: 97.7 F (36.5 C) 98 F (36.7 C) 98 F (36.7 C)   TempSrc: Oral Oral Oral   SpO2: 98% 93% 95%   Weight:    58 kg  Height:        Intake/Output Summary (Last 24 hours) at 07/04/2018 0706 Last data filed at 07/04/2018 0446 Gross per 24 hour  Intake 605.4 ml  Output 500 ml  Net 105.4 ml    +500 cc   Last 3 Weights 07/04/2018 07/03/2018 07/02/2018  Weight (lbs) 127 lb 13.9 oz 126 lb 15.8 oz 127 lb  Weight (kg) 58 kg 57.6 kg 57.607 kg      Telemetry    Atrial fib/flutter   - Personally Reviewed  ECG     - Personally Reviewed  Physical Exam   GEN: No acute distress.   Neck: JVp is increased    Cardiac: Irreg irreg  no murmurs, rubs, or gallops.  Respiratory: Clear to auscultation bilaterally.  Decreased airflow   GI: Distended  RUQ tenderness  MS: No edema; No deformity. Neuro:  Nonfocal  Psych: Normal affect   Labs    Chemistry  Recent Labs  Lab 07/03/18 0501 07/04/18 0018  NA 137 133*  K 4.0 3.5  CL 99 98  CO2 23 22  GLUCOSE 111* 130*  BUN 26* 29*  CREATININE 1.35* 1.34*  CALCIUM 9.6 9.6  PROT 7.3  --   ALBUMIN 3.5  --   AST 31  --   ALT 17  --   ALKPHOS 79  --   BILITOT 3.7*  --   GFRNONAA 46* 46*  GFRAA 53* 53*  ANIONGAP 15 13     Hematology Recent Labs  Lab 07/03/18 0501  WBC 5.8  RBC 5.72*  HGB 13.4  HCT 42.4  MCV 74.1*  MCH 23.4*  MCHC 31.6  RDW 27.1*  PLT 241    Cardiac Enzymes Recent Labs  Lab 07/03/18 0501 07/03/18 1818 07/04/18 0018  TROPONINI 0.04* 0.06* 0.06*   No results for input(s): TROPIPOC in the last 168 hours.   BNP Recent Labs  Lab 07/03/18 0503  BNP 2,450.2*     DDimer No results for input(s): DDIMER in the last 168 hours.   Radiology    Dg Chest Portable 1 View  Result Date: 07/03/2018 CLINICAL DATA:  Shortness of breath for 2 days. Chest tightness. Progressive symptoms. EXAM: PORTABLE CHEST 1 VIEW COMPARISON:  Two-view chest x-ray 05/21/2018 FINDINGS: Heart is enlarged. There is no edema or effusion. Defibrillator pads are in place. Heart size and pads obscure the retrocardiac space. No focal airspace disease is present. Mild curvature of the thoracic spine is stable. The visualized soft tissues and bony thorax are otherwise unremarkable. IMPRESSION: 1. Stable cardiomegaly without failure. 2. No acute cardiopulmonary disease. Electronically Signed   By: San Morelle M.D.   On: 07/03/2018 05:46    Cardiac Studies     Patient Profile     48 y.o. 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 Lorin Mercy.  Assessment &  Plan    1  Acute on chronic systolicCHF Has not had a response to lasix 40   WIll increase to 80 and follow   Stricti I/O If does not respond would recomm a PICC line to follow Coox   May benefit from milrinone  May need a R /L heart acth   This was not done    2  Atrial flutter  HRs have been increased at times  She is on IV dilt which is not optimal given CHF  But she was + for cocaine  Also on anticoaguatlin   She was not on at home  She would do better in SR but I am concerned about compliance with anticoag as outpt    3  CKD  Cr 1.34 today    4  Polysubstance abuse  Needs rehab  Yesterday she agreed to it  For questions or updates, please contact [place] Please consult [email or web address] for contact info under        Signed, Dorris Carnes, MD  07/04/2018, 7:06 AM

## 2018-07-04 NOTE — Progress Notes (Addendum)
PROGRESS NOTE    Sabrina Holland  EPP:[id number] DOB: [birth year] DOA: 07/03/2018 PCP: Gildardo Pounds, NP   Brief Narrative:  Sabrina Holland is a 48 y.o. female with medical history significant of TIA; HTN; COPD; breast cancer s/p mastectomy; and polysubstance abuse presenting with SOB and chest pain was found to be in atrial fibrillation with RVR,. She was started on cardizem gtt and heparin gtt. Cardiology consulted.    Assessment & Plan:   Principal Problem:   Atrial flutter (HCC) Active Problems:   History of breast cancer   Essential hypertension   Polysubstance abuse (HCC)   COPD (chronic obstructive pulmonary disease) (HCC)   Acute on chronic systolic and diastolic heart failure, NYHA class 3 (HCC)    Atrial flutter:  Paroxysmal , .  Rate better controlled today with Cardizem gtt.  On Lovenox for anti coagulation.  ? Cardioversion, awaiting recommendations by cardiology.    Acute on chronic combined CHF:  Cardiomyopathy of unclear etiology. ? Need cardiac cath. Diuresis with IV lasix 40 mg BID, urine output is not measured accurately. Not much diuresis overnight.  Creatinine stable around 1.3. potassium supplementation.  Resume aspirin daily.    Polysubstance abuse:  Will request social work for resources UDS is positive for cocaine and tetrahydrocannabinol.     COPD  No wheezing heard.  Resume dulera and bronchodilators as needed.    Essential hypertension:  Well controlled.   Hyperlipidemia  On atorvastatin.    H/o Breast cancer:   (s/p L mastectomy, chemo, tamoxifen), Outpatient follow up .   DVT prophylaxis: Lovenox.  Code Status: full code.  Family Communication: none at bedside.  Disposition Plan: pending clinical improvement.    Consultants:   Cardiology   Procedures:none.   Antimicrobials: none.   Subjective: Breathing is the same, hypoxic, requiring about 2 lit of [place] oxygen.   Objective: Vitals:   07/03/18  2332 07/04/18 0336 07/04/18 0446 07/04/18 0849  BP: 117/84 102/85  104/87  Pulse:    97  Resp: (!) 21 (!) 32    Temp: 98 F (36.7 C) 98 F (36.7 C)  98.2 F (36.8 C)  TempSrc: Oral Oral  Oral  SpO2: 93% 95%  96%  Weight:   58 kg   Height:        Intake/Output Summary (Last 24 hours) at 07/04/2018 0938 Last data filed at 07/04/2018 0849 Gross per 24 hour  Intake 991.4 ml  Output 500 ml  Net 491.4 ml   Filed Weights   07/03/18 0631 07/04/18 0446  Weight: 57.6 kg 58 kg    Examination:  General exam: does not appear to be in distress.  Respiratory system: air entry diminished at bases, no wheezing heard.  Cardiovascular system: S1 & S2 heard, tachycardic.  Gastrointestinal system: Abdomen is nondistended, soft and nontender. No organomegaly or masses felt. Normal bowel sounds heard. Central nervous system: sleepy, able to answer all questions appropriately.  Extremities: Symmetric 5 x 5 power. Skin: No rashes, lesions or ulcers     Data Reviewed: I have personally reviewed following labs and imaging studies  CBC: Recent Labs  Lab 07/03/18 0501  WBC 5.8  NEUTROABS 2.9  HGB 13.4  HCT 42.4  MCV 74.1*  PLT 606   Basic Metabolic Panel: Recent Labs  Lab 07/03/18 0501 07/04/18 0018  NA 137 133*  K 4.0 3.5  CL 99 98  CO2 23 22  GLUCOSE 111* 130*  BUN 26* 29*  CREATININE 1.35* 1.34*  CALCIUM 9.6 9.6  MG 1.8  --    GFR: Estimated Creatinine Clearance: 45.2 mL/min (A) (by C-G formula based on SCr of 1.34 mg/dL (H)). Liver Function Tests: Recent Labs  Lab 07/03/18 0501  AST 31  ALT 17  ALKPHOS 79  BILITOT 3.7*  PROT 7.3  ALBUMIN 3.5   No results for input(s): LIPASE, AMYLASE in the last 168 hours. No results for input(s): AMMONIA in the last 168 hours. Coagulation Profile: No results for input(s): INR, PROTIME in the last 168 hours. Cardiac Enzymes: Recent Labs  Lab 07/03/18 0501 07/03/18 1818 07/04/18 0018  TROPONINI 0.04* 0.06* 0.06*   BNP  (last 3 results) No results for input(s): PROBNP in the last 8760 hours. HbA1C: Recent Labs    07/03/18 1818  HGBA1C 6.3*   CBG: No results for input(s): GLUCAP in the last 168 hours. Lipid Profile: Recent Labs    07/04/18 0018  CHOL 78  HDL 22*  LDLCALC 48  TRIG 40  CHOLHDL 3.5   Thyroid Function Tests: Recent Labs    07/03/18 0501  TSH 3.244   Anemia Panel: No results for input(s): VITAMINB12, FOLATE, FERRITIN, TIBC, IRON, RETICCTPCT in the last 72 hours. Sepsis Labs: No results for input(s): PROCALCITON, LATICACIDVEN in the last 168 hours.  Recent Results (from the past 240 hour(s))  Novel Coronavirus,NAA,(SEND-OUT TO REF LAB - TAT 24-48 hrs); Hosp Order     Status: None   Collection Time: 07/03/18  5:58 AM   Specimen: Nasopharyngeal Swab; Respiratory  Result Value Ref Range Status   SARS-CoV-2, NAA NOT DETECTED NOT DETECTED Final    Comment: (NOTE) This test was developed and its performance characteristics determined by [employer]. This test has not been FDA cleared or approved. This test has been authorized by FDA under an Emergency Use Authorization (EUA). This test is only authorized for the duration of time the declaration that circumstances exist justifying the authorization of the emergency use of in vitro diagnostic tests for detection of SARS-CoV-2 virus and/or diagnosis of COVID-19 infection under section 564(b)(1) of the Act, 21 U.S.C. 381WEX-9(B)(7), unless the authorization is terminated or revoked sooner. When diagnostic testing is negative, the possibility of a false negative result should be considered in the context of a patient's recent exposures and the presence of clinical signs and symptoms consistent with COVID-19. An individual without symptoms of COVID-19 and who is not shedding SARS-CoV-2 virus would expect to have a negative (not detected) result in this assay. Performed  At: [facility] [address]  [place], [place] [id number] Rush Farmer MD BO:[id number]    [facility]  Final    Comment: Performed at [facility], [place] [address], [place], [place] [address]         Radiology Studies: Dg Chest Portable 1 View  Result Date: 07/03/2018 CLINICAL DATA:  Shortness of breath for 2 days. Chest tightness. Progressive symptoms. EXAM: PORTABLE CHEST 1 VIEW COMPARISON:  Two-view chest x-ray 05/21/2018 FINDINGS: Heart is enlarged. There is no edema or effusion. Defibrillator pads are in place. Heart size and pads obscure the retrocardiac space. No focal airspace disease is present. Mild curvature of the thoracic spine is stable. The visualized soft tissues and bony thorax are otherwise unremarkable. IMPRESSION: 1. Stable cardiomegaly without failure. 2. No acute cardiopulmonary disease. Electronically Signed   By: San Morelle M.D.   On: 07/03/2018 05:46        Scheduled Meds: . aspirin EC  81 mg Oral  Daily  . atorvastatin  40 mg Oral q1800  . enoxaparin (LOVENOX) injection  60 mg Subcutaneous Q12H  . fluticasone  2 spray Each Nare Daily  . furosemide  40 mg Intravenous Q12H  . mometasone-formoterol  2 puff Inhalation BID  . multivitamin with minerals  1 tablet Oral Daily  . pantoprazole  40 mg Oral Daily  . sodium chloride flush  3 mL Intravenous Q12H  . umeclidinium bromide  1 puff Inhalation Daily   Continuous Infusions: . sodium chloride    . diltiazem (CARDIZEM) infusion 10 mg/hr (07/04/18 0300)     LOS: 1 day    Time spent: 38 minutes.     Hosie Poisson, MD [facility] Pager [phone]   If 7PM-7AM, please contact night-coverage [email or web address] Password [facility] 07/04/2018, 9:38 AM

## 2018-07-05 ENCOUNTER — Inpatient Hospital Stay (HOSPITAL_COMMUNITY): Payer: Medicaid Other | Admitting: Anesthesiology

## 2018-07-05 ENCOUNTER — Ambulatory Visit: Payer: Self-pay | Admitting: Cardiology

## 2018-07-05 ENCOUNTER — Inpatient Hospital Stay (HOSPITAL_COMMUNITY): Payer: Medicaid Other

## 2018-07-05 ENCOUNTER — Inpatient Hospital Stay: Payer: Self-pay

## 2018-07-05 DIAGNOSIS — J9600 Acute respiratory failure, unspecified whether with hypoxia or hypercapnia: Secondary | ICD-10-CM

## 2018-07-05 DIAGNOSIS — I472 Ventricular tachycardia: Secondary | ICD-10-CM

## 2018-07-05 DIAGNOSIS — Z978 Presence of other specified devices: Secondary | ICD-10-CM | POA: Diagnosis present

## 2018-07-05 DIAGNOSIS — I340 Nonrheumatic mitral (valve) insufficiency: Secondary | ICD-10-CM

## 2018-07-05 DIAGNOSIS — I4721 Torsades de pointes: Secondary | ICD-10-CM

## 2018-07-05 DIAGNOSIS — I5043 Acute on chronic combined systolic (congestive) and diastolic (congestive) heart failure: Secondary | ICD-10-CM

## 2018-07-05 DIAGNOSIS — I469 Cardiac arrest, cause unspecified: Secondary | ICD-10-CM | POA: Diagnosis present

## 2018-07-05 DIAGNOSIS — I361 Nonrheumatic tricuspid (valve) insufficiency: Secondary | ICD-10-CM

## 2018-07-05 LAB — MAGNESIUM: Magnesium: 3.1 mg/dL — ABNORMAL HIGH (ref 1.7–2.4)

## 2018-07-05 LAB — POCT I-STAT 7, (LYTES, BLD GAS, ICA,H+H)
Acid-base deficit: 2 mmol/L (ref 0.0–2.0)
Bicarbonate: 23.6 mmol/L (ref 20.0–28.0)
Calcium, Ion: 1.19 mmol/L (ref 1.15–1.40)
HCT: 44 % (ref 36.0–46.0)
Hemoglobin: 15 g/dL (ref 12.0–15.0)
O2 Saturation: 100 %
Patient temperature: 97.8
Potassium: 3.4 mmol/L — ABNORMAL LOW (ref 3.5–5.1)
Sodium: 131 mmol/L — ABNORMAL LOW (ref 135–145)
TCO2: 25 mmol/L (ref 22–32)
pCO2 arterial: 39.8 mmHg (ref 32.0–48.0)
pH, Arterial: 7.379 (ref 7.350–7.450)
pO2, Arterial: 410 mmHg — ABNORMAL HIGH (ref 83.0–108.0)

## 2018-07-05 LAB — CBC
HCT: 36.8 % (ref 36.0–46.0)
Hemoglobin: 12.5 g/dL (ref 12.0–15.0)
MCH: 23.9 pg — ABNORMAL LOW (ref 26.0–34.0)
MCHC: 34 g/dL (ref 30.0–36.0)
MCV: 70.4 fL — ABNORMAL LOW (ref 80.0–100.0)
Platelets: 254 10*3/uL (ref 150–400)
RBC: 5.23 MIL/uL — ABNORMAL HIGH (ref 3.87–5.11)
RDW: 25.7 % — ABNORMAL HIGH (ref 11.5–15.5)
WBC: 7 10*3/uL (ref 4.0–10.5)
nRBC: 0.4 % — ABNORMAL HIGH (ref 0.0–0.2)

## 2018-07-05 LAB — BASIC METABOLIC PANEL
Anion gap: 13 (ref 5–15)
BUN: 31 mg/dL — ABNORMAL HIGH (ref 6–20)
CO2: 21 mmol/L — ABNORMAL LOW (ref 22–32)
Calcium: 9.5 mg/dL (ref 8.9–10.3)
Chloride: 96 mmol/L — ABNORMAL LOW (ref 98–111)
Creatinine, Ser: 1.5 mg/dL — ABNORMAL HIGH (ref 0.44–1.00)
GFR calc Af Amer: 47 mL/min — ABNORMAL LOW (ref >60)
GFR calc non Af Amer: 40 mL/min — ABNORMAL LOW (ref >60)
Glucose, Bld: 136 mg/dL — ABNORMAL HIGH (ref 70–99)
Potassium: 3.5 mmol/L (ref 3.5–5.1)
Sodium: 130 mmol/L — ABNORMAL LOW (ref 135–145)

## 2018-07-05 LAB — ECHOCARDIOGRAM COMPLETE
Height: 65 in
Weight: 2063.51 oz

## 2018-07-05 LAB — TROPONIN I
Troponin I: 0.08 ng/mL (ref <0.03)
Troponin I: 0.08 ng/mL (ref <0.03)
Troponin I: 0.08 ng/mL (ref <0.03)

## 2018-07-05 MED ORDER — BUDESONIDE 0.5 MG/2ML IN SUSP
0.5000 mg | Freq: Two times a day (BID) | RESPIRATORY_TRACT | Status: DC
  Administered 2018-07-05 – 2018-07-12 (×15): 0.5 mg via RESPIRATORY_TRACT
  Filled 2018-07-05 (×15): qty 2

## 2018-07-05 MED ORDER — DEXMEDETOMIDINE HCL IN NACL 200 MCG/50ML IV SOLN
0.4000 ug/kg/h | INTRAVENOUS | Status: DC
  Administered 2018-07-05: 0.4 ug/kg/h via INTRAVENOUS
  Filled 2018-07-05: qty 50

## 2018-07-05 MED ORDER — POTASSIUM CHLORIDE 10 MEQ/100ML IV SOLN
10.0000 meq | INTRAVENOUS | Status: AC
  Administered 2018-07-05 (×4): 10 meq via INTRAVENOUS
  Filled 2018-07-05 (×4): qty 100

## 2018-07-05 MED ORDER — THIAMINE HCL 100 MG/ML IJ SOLN
100.0000 mg | Freq: Every day | INTRAMUSCULAR | Status: DC
  Administered 2018-07-05 – 2018-07-08 (×4): 100 mg via INTRAVENOUS
  Filled 2018-07-05 (×4): qty 2

## 2018-07-05 MED ORDER — MIDAZOLAM HCL 2 MG/2ML IJ SOLN
1.0000 mg | Freq: Once | INTRAMUSCULAR | Status: AC
  Administered 2018-07-05: 03:00:00 1 mg via INTRAVENOUS

## 2018-07-05 MED ORDER — FENTANYL CITRATE (PF) 100 MCG/2ML IJ SOLN
50.0000 ug | Freq: Once | INTRAMUSCULAR | Status: AC
  Administered 2018-07-05: 50 ug via INTRAVENOUS

## 2018-07-05 MED ORDER — CHLORHEXIDINE GLUCONATE 0.12 % MT SOLN
OROMUCOSAL | Status: AC
  Filled 2018-07-05: qty 15

## 2018-07-05 MED ORDER — FENTANYL CITRATE (PF) 100 MCG/2ML IJ SOLN
INTRAMUSCULAR | Status: AC
  Administered 2018-07-05: 03:00:00 50 ug via INTRAVENOUS
  Filled 2018-07-05: qty 2

## 2018-07-05 MED ORDER — METOPROLOL TARTRATE 5 MG/5ML IV SOLN
2.5000 mg | Freq: Four times a day (QID) | INTRAVENOUS | Status: DC
  Administered 2018-07-05 – 2018-07-06 (×4): 2.5 mg via INTRAVENOUS
  Filled 2018-07-05 (×4): qty 5

## 2018-07-05 MED ORDER — FENTANYL BOLUS VIA INFUSION
50.0000 ug | INTRAVENOUS | Status: DC | PRN
  Administered 2018-07-05 – 2018-07-07 (×7): 50 ug via INTRAVENOUS
  Filled 2018-07-05: qty 50

## 2018-07-05 MED ORDER — AMIODARONE HCL IN DEXTROSE 360-4.14 MG/200ML-% IV SOLN
INTRAVENOUS | Status: AC
  Filled 2018-07-05: qty 200

## 2018-07-05 MED ORDER — MIDAZOLAM HCL 2 MG/2ML IJ SOLN
4.0000 mg | Freq: Once | INTRAMUSCULAR | Status: AC
  Administered 2018-07-05: 04:00:00 4 mg via INTRAVENOUS

## 2018-07-05 MED ORDER — CHLORHEXIDINE GLUCONATE CLOTH 2 % EX PADS: 6.0000 | MEDICATED_PAD | Freq: Every day | CUTANEOUS | Status: DC

## 2018-07-05 MED ORDER — SODIUM CHLORIDE 0.9% FLUSH
10.0000 mL | Freq: Two times a day (BID) | INTRAVENOUS | Status: DC
  Administered 2018-07-05 – 2018-07-10 (×5): 10 mL
  Administered 2018-07-11: 20 mL
  Administered 2018-07-11 – 2018-07-12 (×2): 10 mL

## 2018-07-05 MED ORDER — AMIODARONE HCL IN DEXTROSE 360-4.14 MG/200ML-% IV SOLN
30.0000 mg/h | INTRAVENOUS | Status: DC
  Administered 2018-07-05 – 2018-07-10 (×12): 30 mg/h via INTRAVENOUS
  Filled 2018-07-05 (×13): qty 200

## 2018-07-05 MED ORDER — VITAL AF 1.2 CAL PO LIQD
1000.0000 mL | ORAL | Status: DC
  Administered 2018-07-05: 1000 mL

## 2018-07-05 MED ORDER — ORAL CARE MOUTH RINSE
15.0000 mL | OROMUCOSAL | Status: DC
  Administered 2018-07-05 – 2018-07-07 (×20): 15 mL via OROMUCOSAL

## 2018-07-05 MED ORDER — MIDAZOLAM HCL 2 MG/2ML IJ SOLN
INTRAMUSCULAR | Status: AC
  Administered 2018-07-05: 4 mg via INTRAVENOUS
  Filled 2018-07-05: qty 4

## 2018-07-05 MED ORDER — FENTANYL 2500MCG IN NS 250ML (10MCG/ML) PREMIX INFUSION
50.0000 ug/h | INTRAVENOUS | Status: DC
  Administered 2018-07-05: 50 ug/h via INTRAVENOUS
  Administered 2018-07-05: 17:00:00 200 ug/h via INTRAVENOUS
  Administered 2018-07-06: 175 ug/h via INTRAVENOUS
  Administered 2018-07-06: 150 ug/h via INTRAVENOUS
  Filled 2018-07-05 (×4): qty 250

## 2018-07-05 MED ORDER — METOPROLOL TARTRATE 5 MG/5ML IV SOLN
2.5000 mg | Freq: Once | INTRAVENOUS | Status: AC
  Administered 2018-07-05: 2.5 mg via INTRAVENOUS
  Filled 2018-07-05: qty 5

## 2018-07-05 MED ORDER — IPRATROPIUM-ALBUTEROL 0.5-2.5 (3) MG/3ML IN SOLN
3.0000 mL | Freq: Four times a day (QID) | RESPIRATORY_TRACT | Status: DC
  Administered 2018-07-05 – 2018-07-07 (×12): 3 mL via RESPIRATORY_TRACT
  Filled 2018-07-05 (×12): qty 3

## 2018-07-05 MED ORDER — CHLORHEXIDINE GLUCONATE CLOTH 2 % EX PADS
6.0000 | MEDICATED_PAD | Freq: Every day | CUTANEOUS | Status: DC
  Administered 2018-07-07: 6 via TOPICAL

## 2018-07-05 MED ORDER — LACTATED RINGERS IV BOLUS
500.0000 mL | Freq: Once | INTRAVENOUS | Status: AC
  Administered 2018-07-05: 500 mL via INTRAVENOUS

## 2018-07-05 MED ORDER — MIDAZOLAM HCL 2 MG/2ML IJ SOLN
INTRAMUSCULAR | Status: AC
  Administered 2018-07-05: 1 mg via INTRAVENOUS
  Filled 2018-07-05: qty 2

## 2018-07-05 MED ORDER — CHLORHEXIDINE GLUCONATE 0.12% ORAL RINSE (MEDLINE KIT)
15.0000 mL | Freq: Two times a day (BID) | OROMUCOSAL | Status: DC
  Administered 2018-07-05 – 2018-07-07 (×5): 15 mL via OROMUCOSAL

## 2018-07-05 MED ORDER — FOLIC ACID 5 MG/ML IJ SOLN
1.0000 mg | Freq: Every day | INTRAMUSCULAR | Status: DC
  Administered 2018-07-05 – 2018-07-07 (×3): 1 mg via INTRAVENOUS
  Filled 2018-07-05 (×5): qty 0.2

## 2018-07-05 MED ORDER — SODIUM CHLORIDE 0.9% FLUSH: 10.0000 mL | INTRAVENOUS | Status: DC | PRN

## 2018-07-05 MED ORDER — PANTOPRAZOLE SODIUM 40 MG IV SOLR
40.0000 mg | Freq: Every day | INTRAVENOUS | Status: DC
  Administered 2018-07-05 – 2018-07-08 (×4): 40 mg via INTRAVENOUS
  Filled 2018-07-05 (×5): qty 40

## 2018-07-05 MED FILL — Medication: Qty: 1 | Status: AC

## 2018-07-05 NOTE — Progress Notes (Signed)
eLink Physician-Brief Progress Note Patient Name: Sabrina Holland DOB: 01-Sep-1970 MRN: [id number]   Date of Service  07/05/2018  HPI/Events of Note  48 year old woman with CHF, A fib and was being diuresed on the floor, with rate control and anticoagulation. Transferred to ICU after VF arrest - needed defibrillation and was given 2 gram mag. Also given some sedation because she was noted to have tongue bite/restlessess. Currently in sinus rhythm, stable BP and on vent support.   eICU Interventions  Notified bedside ICU team for evaluation  Ordered ABG, CXR and mag in addition to other labs      Intervention Category Major Interventions: Arrhythmia - evaluation and management;Respiratory failure - evaluation and management Evaluation Type: New Patient Evaluation  Margaretmary Lombard 07/05/2018, 3:47 AM

## 2018-07-05 NOTE — Progress Notes (Signed)
NAME:  Sabrina Holland, MRN:  [id number], DOB:  02-26-1970, LOS: 2 ADMISSION DATE:  07/03/2018, CONSULTATION DATE:  6/18 REFERRING MD:  Dr. Karleen Hampshire, CHIEF COMPLAINT:  VF arrest   Brief History   48 year old female admitted 6/16 for chest pain and atrial flutter. Early hours of 6/18 suffered VF/Torsades arrest.   History of present illness   48 year old female with PMH as below, which is significant for breast CA (s/p mastectomy and chemo), HFrEF, COPD, and polysubstance abuse. She was admitted to [facility] on 6/16 after presenting to the ED with complaints of SOB x 3 days as well as chest and rib pain related to work of breathing. Also complaining of lower extremity edema. She was found to be in AF with RVR. This responded well to diltiazem infusion and she was admitted to tho hospitalists team on heparin infusion. She was seen in consultation by cardiology who have been diuresing her and trying to find the best treatment options for her AF given her substance abuse and medical non-compliance.   In the early AM hours she was reportedly acting normal and suddenly became unresponsive and was noted to be in torsades/VF. ACLS was initiated and continued for 5 minutes total. She was given mag and defibrillated x1 with ROSC. She was intubated by anesthesia and transferred to ICU.   Past Medical History   has a past medical history of Atrial flutter ([place]) (07/03/2018), Breast cancer, stage 2 ([place]) (02/23/2011), Chronic combined systolic and diastolic CHF (congestive heart failure) ([place]), CKD (chronic kidney disease), stage II, Cocaine use, COPD (chronic obstructive pulmonary disease) ([place]), Homelessness, Hypertension, Polysubstance abuse ([place]), Poor social situation, and TIA (transient ischemic attack) (11/25/2011).  Significant Hospital Events   6/16 admit 6/18 Arrest, intubated, tx to ICU.   Consults:  Cardiology  Procedures:  6/18 ETT  Significant Diagnostic Tests:    Micro  Data:    Antimicrobials:     Interim history/subjective:    07/05/2018 - retaining urine per RN.  Escalated to 50% fio2. Per RN cards wants her intubated one more day. Cards unclear what precipitated torsade and starting beta blocker and dc flexiril. Needs to avoid cocaine.   Objective   Blood pressure (!) 111/94, pulse 98, temperature 98.4 F (36.9 C), temperature source Oral, resp. rate (!) 22, height 5\' 5"  (1.651 m), weight 58.5 kg, last menstrual period 02/18/2015, SpO2 98 %.    Vent Mode: PRVC FiO2 (%):  [40 %-100 %] 40 % Set Rate:  [16 bmp] 16 bmp Vt Set:  [450 mL] 450 mL PEEP:  [5 cmH20] 5 cmH20 Plateau Pressure:  [12 cmH20-20 cmH20] 20 cmH20   Intake/Output Summary (Last 24 hours) at 07/05/2018 1104 Last data filed at 07/05/2018 1000 Gross per 24 hour  Intake 1350.22 ml  Output 750 ml  Net 600.22 ml   Filed Weights   07/03/18 0631 07/04/18 0446 07/05/18 0400  Weight: 57.6 kg 58 kg 58.5 kg   General Appearance:  Looks criticall ill Head:  Normocephalic, without obvious abnormality, atraumatic Eyes:  PERRL - yes, conjunctiva/corneas - muddy     Ears:  Normal external ear canals, both ears Nose:  G tube - no Throat:  ETT TUBE - yes , OG tube - yes Neck:  Supple,  No enlargement/tenderness/nodules Lungs: Clear to auscultation bilaterally, Ventilator   Synchrony - yes Heart:  S1 and S2 normal, no murmur, CVP - no.  Pressors - no Abdomen:  Soft, no masses, no organomegaly  Genitalia / Rectal:  Not done Extremities:  Extremities- intact Skin:  ntact in exposed areas . Sacral area - not examined Neurologic:  Sedation - fengt gtt -> RASS - -1 . Moves all 4s - yes. CAM-ICU - not tested . Orientation - follows some commands    LABS    PULMONARY Recent Labs  Lab 07/05/18 0416  PHART 7.379  PCO2ART 39.8  PO2ART 410.0*  HCO3 23.6  TCO2 25  O2SAT 100.0    CBC Recent Labs  Lab 07/03/18 0501 07/04/18 0918 07/05/18 0319 07/05/18 0416  HGB 13.4 11.6* 12.5 15.0   HCT 42.4 34.9* 36.8 44.0  WBC 5.8 4.8 7.0  --   PLT 241 241 254  --     COAGULATION No results for input(s): INR in the last 168 hours.  CARDIAC   Recent Labs  Lab 07/03/18 1818 07/04/18 0018 07/04/18 0918 07/05/18 0704 07/05/18 1007  TROPONINI 0.06* 0.06* 0.06* 0.08* 0.08*   No results for input(s): PROBNP in the last 168 hours.   CHEMISTRY Recent Labs  Lab 07/03/18 0501 07/04/18 0018 07/05/18 0319 07/05/18 0416  NA 137 133* 130* 131*  K 4.0 3.5 3.5 3.4*  CL 99 98 96*  --   CO2 23 22 21*  --   GLUCOSE 111* 130* 136*  --   BUN 26* 29* 31*  --   CREATININE 1.35* 1.34* 1.50*  --   CALCIUM 9.6 9.6 9.5  --   MG 1.8  --  3.1*  --    Estimated Creatinine Clearance: 40.4 mL/min (A) (by C-G formula based on SCr of 1.5 mg/dL (H)).   LIVER Recent Labs  Lab 07/03/18 0501  AST 31  ALT 17  ALKPHOS 79  BILITOT 3.7*  PROT 7.3  ALBUMIN 3.5     INFECTIOUS No results for input(s): LATICACIDVEN, PROCALCITON in the last 168 hours.   ENDOCRINE CBG (last 3)  No results for input(s): GLUCAP in the last 72 hours.       IMAGING x48h  - image(s) personally visualized  -   highlighted in bold Dg Chest Port 1 View  Result Date: 07/05/2018 CLINICAL DATA:  Endotracheal tube adjustment.  Placement of OG tube. EXAM: PORTABLE CHEST 1 VIEW COMPARISON:  One-view chest x-ray 07/05/2018 at 4:16 a.m. FINDINGS: The endotracheal tube has been advanced and now terminates 5.2 cm above the carina. A new OG tube courses off the inferior border of the film. The heart is enlarged. Left pleural effusion and retrocardiac airspace opacity is stable. Minimal atelectasis at the right base is stable. Defibrillator pads are stable. IMPRESSION: 1. Endotracheal tube advanced 1 cm, now 5 cm above the carina. 2. New OG tube courses off the inferior border the film. 3. Stable cardiomegaly with retrocardiac opacities as previously described. Electronically Signed   By: San Morelle M.D.   On:  07/05/2018 05:41   Dg Chest Port 1 View  Result Date: 07/05/2018 CLINICAL DATA:  Status post intubation. EXAM: PORTABLE CHEST 1 VIEW COMPARISON:  One-view chest x-ray 07/03/2018. FINDINGS: Patient has been intubated. The endotracheal tube air terminates 6 cm above the carina, at the level clavicles. Defibrillator pads remain in place. The heart is enlarged. There is no edema or effusion. Retrocardiac opacification is stable. The lungs are otherwise clear. IMPRESSION: 1. Satisfactory positioning of the endotracheal tube, 6 cm above the carina. 2. Stable cardiomegaly without failure. Electronically Signed   By: San Morelle M.D.   On: 07/05/2018 04:52   Dg Abd  Portable 1v  Result Date: 07/05/2018 CLINICAL DATA:  Abdominal distension. EXAM: PORTABLE ABDOMEN - 1 VIEW COMPARISON:  None. FINDINGS: The bowel gas pattern is normal. No radio-opaque calculi or other significant radiographic abnormality are seen. IMPRESSION: Negative exam. Electronically Signed   By: Inge Rise M.D.   On: 07/05/2018 07:28   [place] Ekg Site Rite  Result Date: 07/05/2018 If Site Rite image not attached, placement could not be confirmed due to current cardiac rhythm.     Resolved Hospital Problem list     Assessment & Plan:   ASSESSMENT / PLAN:  PULMONARY A:  Hx of cOOD Acute resp failure due to cocaine and torsae  07/05/2018 -> does not meet sbt criteria  P:   Full vent support duoneb budesonidie   NEUROLOGIC A:   On sedation P:   fent gtt for RASS 0 o -2    VASCULAR A:   Maintains bp/hr  P:  monitor  CARDIAC A: Hx of Atrial flutter with RVR - chronic systolic CHF - OZ36% in Nov 2019 Torsade with cocaine and MJ on board  P: Per cards Avoid qtc ddrugs Echo cardizem gtt   INFECTIOUS A:   covid neg HIV neg  P:   Check PCT Monitor off abcx  RENAL A:  Mild AKI P:  fluids  ELECTROLYTES A:  Low k  P: repleted   GASTROINTESTINAL A:   NPO  P:   ppi TF  start  HEMATOLOGIC A:  At risk anema   P:  - PRBC for hgb </= 6.9gm%    - exceptions are   -  if ACS susepcted/confirmed then transfuse for hgb </= 8.0gm%,  or    -  active bleeding with hemodynamic instability, then transfuse regardless of hemoglobin value   At at all times try to transfuse 1 unit prbc as possible with exception of active hemorrhage     ENDOCRINE A:   At risk low sugare   P:   monito4  MSK/DERM x   Polysubstance abuse (history of cocaine, opiates, THC, and ETOH) - has agreed to rehab once cleared - Thiamine and folic acid.  - Supportive care.    Best practice:  Diet: NPO Pain/Anxiety/Delirium protocol (if indicated): Fentanyl VAP protocol (if indicated): Per protocol DVT prophylaxis: enoxaparin GI prophylaxis: PPI Glucose control:  Mobility: BR Code Status: FULL Family Communication: Family unable to be contact via contact info in EMR Disposition: ICU      ATTESTATION & SIGNATURE   The patient Sabrina Holland is critically ill with multiple organ systems failure and requires high complexity decision making for assessment and support, frequent evaluation and titration of therapies, application of advanced monitoring technologies and extensive interpretation of multiple databases.   Critical Care Time devoted to patient care services described in this note is  30  Minutes. This time reflects time of care of this signee Dr Brand Males. This critical care time does not reflect procedure time, or teaching time or supervisory time of PA/NP/Med student/Med Resident etc but could involve care discussion time     Dr. Brand Males, M.D., [facility].C.P Pulmonary and Critical Care Medicine Staff Physician [place] [facility] Pager: [phone], If no answer or between  15:00h - 7:00h: call [phone]  07/05/2018 11:04 AM

## 2018-07-05 NOTE — Progress Notes (Signed)
Cardiology [place] Note  Contacted multiple times by care nurse regarding patient's heart rate. Has had erratic heart rates for past several hours. Typically in the 130's to 160's. ECG revealing likely ectopic atrial tachycardia. Appears to have multiple arrhythmias occurring on telemetry. Patient with history of severely reduced ejection fraction heart failure. Will not be able to tolerate tachycardia to this degree for prolonged period of time. Currently not receiving any causative medications (I.e. pressors). Hemodynamically stable. Prior ECG's reviewed. Patient does have history of mildly prolonged QT interval and had TdP arrest earlier today. Not on any other QT prolonging medications. Will start low dose amiodarone infusion without bolus to attempt to control multiple arrhythmias. Options at this time very limited and patient remains critically ill. Do not think increasing IV beta blocker or CCB is a good idea right now given severe systolic heart failure. Will monitor closely for signs of QT prolongation on amiodarone.   Marcie Mowers, MD Cardiology Fellow, PGY-6

## 2018-07-05 NOTE — Progress Notes (Signed)
Patient's rhythm changed into Torsades at 0229, upon assessment, patient was contracted and rigid with hands in fists.  It also appeared that patient had bit her tongue. Patient unresponsive and breathing agonally.  Code called and compressions started at 0231 with first bagging at 0233.  ACLS protocol carried out the duration of the code.  Patient was intubated at bedside at transferred to [facility].  Report given to Basilia Jumbo., RN.  Remainder of IV fentanyl (63mcg) and IV versed (1mg ) used following intubation were transported with patient and given to Wilhemena Durie, Therapist, sports by rapid response RN.  MDs at bedside attempted to call family but received no response.  All belongings including ring, cell phone, phone charger, clothing and shoes were transported with patient.

## 2018-07-05 NOTE — Progress Notes (Signed)
PT Cancellation Note  Patient Details Name: Lillyen Schow MRN: [id number] DOB: April 05, 1970   Cancelled Treatment:    Reason Eval/Treat Not Completed: Other (comment)(Pt coded and is intubated.  May extubate tomorrow per nurse)Will check back tomorrow.    Denice Paradise 07/05/2018, 12:45 PM  Eman Rynders,PT Acute Rehabilitation Services Pager:  [phone]  Office:  [phone]

## 2018-07-05 NOTE — Progress Notes (Signed)
Chaplain responded to a Code Blue for this patient.  Patient was moved from [facility] to [facility].  Medical team attempted 2x to call family with no answer.  Chaplain available for support to patient and/or family as needed. Bent, MDiv.     07/05/18 0300  Clinical Encounter Type  Visited With Health care provider  Visit Type Code  Referral From Nurse  Consult/Referral To Chaplain

## 2018-07-05 NOTE — Transfer of Care (Signed)
Immediate Anesthesia Transfer of Care Note  Patient: Sabrina Holland  Procedure(s) Performed: AN AD [place] INTUBATION  Patient Location: ICU  Anesthesia Type:General  Level of Consciousness: Patient remains intubated per anesthesia plan  Airway & Oxygen Therapy: Patient remains intubated per anesthesia plan  Post-op Assessment: Report given to RN and Post -op Vital signs reviewed and stable  Post vital signs: Reviewed and stable  Last Vitals:  Vitals Value Taken Time  BP 109/89 07/05/18 0254  Temp    Pulse 117 07/05/18 0255  Resp 15 07/05/18 0255  SpO2 100 % 07/05/18 0255  Vitals shown include unvalidated device data.  Last Pain:  Vitals:   07/04/18 2353  TempSrc:   PainSc: Asleep         Complications: No apparent anesthesia complications

## 2018-07-05 NOTE — Code Documentation (Signed)
CODE BLUE NOTE  Patient Name: Sabrina Holland   MRN: [id number]   Date of Birth/ Sex: 06/16/1970 , female      Admission Date: 07/03/2018  Attending Provider: Hosie Poisson, MD  Primary Diagnosis: Atrial flutter [facility])    Indication: Pt was in her usual state of health until this AM, when she was noted to be unresponsive and in torsades. Code blue was subsequently called. At the time of arrival on scene, ACLS protocol was underway.    Technical Description:  - CPR performance duration:  5 minutes  - Was defibrillation or cardioversion used? Yes   - Was external pacer placed? No  - Was patient intubated pre/post CPR? Yes    Medications Administered: Y = Yes; Blank = No Amiodarone    Atropine    Calcium    Epinephrine    Lidocaine    Magnesium  Y  Norepinephrine    Phenylephrine    Sodium bicarbonate    Vasopressin    Fentanyl and Versed  Post CPR evaluation:  - Final Status - Was patient successfully resuscitated ? Yes - What is current rhythm? A fib with RVR - What is current hemodynamic status? stable   Miscellaneous Information:  - Labs sent, including: BMP, CBC  - Primary team notified?  Yes  - Family Notified? Attempted to call both number in chart.  Called father's number x2, goes straight to VM and VM not set up.  Daughter's number x2, "cannot be completed as dialled."  - Additional notes/ transfer status: Moved to 2H06.        [place], DO  07/05/2018, 2:59 AM

## 2018-07-05 NOTE — Code Documentation (Signed)
Pt coded. See code blue sheet.

## 2018-07-05 NOTE — Anesthesia Procedure Notes (Signed)
Procedure Name: Intubation Date/Time: 07/05/2018 2:55 AM Performed by: Clovis Cao, CRNA Pre-anesthesia Checklist: Patient identified, Emergency Drugs available, Suction available, Patient being monitored and Timeout performed Patient Re-evaluated:Patient Re-evaluated prior to induction Oxygen Delivery Method: Ambu bag Preoxygenation: Pre-oxygenation with 100% oxygen Laryngoscope Size: Glidescope Grade View: Grade I Tube type: Subglottic suction tube Tube size: 7.5 mm Number of attempts: 1 Airway Equipment and Method: Stylet and Video-laryngoscopy Placement Confirmation: ETT inserted through vocal cords under direct vision,  positive ETCO2 and CO2 detector Secured at: 22 cm Tube secured with: Tape Dental Injury: Teeth and Oropharynx as per pre-operative assessment

## 2018-07-05 NOTE — Progress Notes (Signed)
Peripherally Inserted Central Catheter/Midline Placement  The IV Nurse has discussed with the patient and/or persons authorized to consent for the patient, the purpose of this procedure and the potential benefits and risks involved with this procedure.  The benefits include less needle sticks, lab draws from the catheter, and the patient may be discharged home with the catheter. Risks include, but not limited to, infection, bleeding, blood clot (thrombus formation), and puncture of an artery; nerve damage and irregular heartbeat and possibility to perform a PICC exchange if needed/ordered by physician.  Alternatives to this procedure were also discussed.  Bard Power PICC patient education guide, fact sheet on infection prevention and patient information card has been provided to patient /or left at bedside.    PICC/Midline Placement Documentation  PICC Double Lumen 07/05/18 PICC Right Brachial 37 cm (Active)  Indication for Insertion or Continuance of Line Prolonged intravenous therapies 07/05/18 1600  Site Assessment Clean;Dry;Intact 07/05/18 1600  Lumen #1 Status Flushed;Blood return noted 07/05/18 1600  Lumen #2 Status Flushed;Blood return noted 07/05/18 1600  Dressing Type Transparent 07/05/18 1600  Dressing Status Clean;Dry;Intact;Antimicrobial disc in place 07/05/18 1600  Dressing Change Due 07/12/18 07/05/18 1600       Jule Economy Horton 07/05/2018, 4:32 PM

## 2018-07-05 NOTE — Progress Notes (Signed)
  Echocardiogram 2D Echocardiogram has been performed.  Fredrica Capano G Eiko Mcgowen 07/05/2018, 3:02 PM

## 2018-07-05 NOTE — Progress Notes (Addendum)
Initial Nutrition Assessment  RD working remotely.  DOCUMENTATION CODES:   Not applicable  INTERVENTION:   ADDENDUM (1254): RD consulted for TF initiation and management. Will order the recommendations as detailed below. Pt to remain intubated 1 more day per Cardiology.  If pt remains intubated, recommend initiation of enteral nutrition: - Vital AF 1.2 @ 50 ml/hr (1200 ml/day)  Tube feeding regimen provides 1440 kcal, 90 grams of protein, and 973 ml of H2O (100% of needs).  NUTRITION DIAGNOSIS:   Inadequate oral intake related to inability to eat as evidenced by NPO status.  GOAL:   Patient will meet greater than or equal to 90% of their needs  MONITOR:   Weight trends, Labs, Vent status, I & O's  REASON FOR ASSESSMENT:   Ventilator    ASSESSMENT:   48 year old female who presented to the ED on 6/16 with SOB and chest pain. PMH of HTN, COPD, CHF, TIA, polysubstance abuse, previous breast cancer s/p mastectomy.  6/18 - Code Blue due to pt unresponsive and in Torsades, VF arrest, intubated  No plan for TTM given short code.  OGT in place, currently clamped.  Reviewed weight history in chart. Weight fairly stable over the last year with fluctuations between 58-63 kg.  RD will leave TF recommendations.  Patient is currently intubated on ventilator support MV: 10.2 L/min Temp (24hrs), Avg:97.9 F (36.6 C), Min:97.4 F (36.3 C), Max:98.4 F (36.9 C) BP: 103/84 MAP: 92  Medications reviewed and include: folic acid, MVI with minerals, Protonix, K-dur 10 mEq daily, thiamine, IV KCl 10 mEq x 4 runs  Drips: Fentanyl  Labs reviewed: sodium 131, potassium 3.4, chloride 96, BUN 31, creatinine 1.50, magnesium 3.1  UOP: 950 ml x 24 hours  NUTRITION - FOCUSED PHYSICAL EXAM:  Unable to complete at this time. RD working remotely.  Diet Order:   Diet Order            Diet NPO time specified  Diet effective now              EDUCATION NEEDS:   No education  needs have been identified at this time  Skin:  Skin Assessment: Reviewed RN Assessment  Last BM:  07/03/18  Height:   Ht Readings from Last 1 Encounters:  07/03/18 5\' 5"  (1.651 m)    Weight:   Wt Readings from Last 1 Encounters:  07/05/18 58.5 kg    Ideal Body Weight:  56.8 kg  BMI:  Body mass index is 21.46 kg/m.  Estimated Nutritional Needs:   Kcal:  3244  Protein:  80-95 grams  Fluid:  >/= 1.5 L    Gaynell Face, MS, RD, LDN Inpatient Clinical Dietitian Pager: [phone] Weekend/After Hours: [phone]

## 2018-07-05 NOTE — Plan of Care (Signed)
  Problem: Education: Goal: Ability to demonstrate management of disease process will improve Outcome: Not Progressing Goal: Ability to verbalize understanding of medication therapies will improve Outcome: Not Progressing Goal: Individualized Educational Video(s) Outcome: Not Progressing   Problem: Activity: Goal: Capacity to carry out activities will improve Outcome: Not Progressing   Problem: Cardiac: Goal: Ability to achieve and maintain adequate cardiopulmonary perfusion will improve Outcome: Not Progressing   Problem: Education: Goal: Knowledge of General Education information will improve Description: Including pain rating scale, medication(s)/side effects and non-pharmacologic comfort measures Outcome: Not Progressing   Problem: Health Behavior/Discharge Planning: Goal: Ability to manage health-related needs will improve Outcome: Not Progressing   Problem: Clinical Measurements: Goal: Ability to maintain clinical measurements within normal limits will improve Outcome: Not Progressing Goal: Will remain free from infection Outcome: Not Progressing Goal: Diagnostic test results will improve Outcome: Not Progressing Goal: Respiratory complications will improve Outcome: Not Progressing Goal: Cardiovascular complication will be avoided Outcome: Not Progressing   Problem: Activity: Goal: Risk for activity intolerance will decrease Outcome: Not Progressing   Problem: Nutrition: Goal: Adequate nutrition will be maintained Outcome: Not Progressing   Problem: Coping: Goal: Level of anxiety will decrease Outcome: Not Progressing   Problem: Elimination: Goal: Will not experience complications related to bowel motility Outcome: Not Progressing Goal: Will not experience complications related to urinary retention Outcome: Not Progressing   Problem: Pain Managment: Goal: General experience of comfort will improve Outcome: Not Progressing   Problem: Safety: Goal:  Ability to remain free from injury will improve Outcome: Not Progressing   Problem: Skin Integrity: Goal: Risk for impaired skin integrity will decrease Outcome: Not Progressing   

## 2018-07-05 NOTE — Consult Note (Signed)
NAME:  Sabrina Holland, MRN:  [id number], DOB:  [birth year], LOS: 2 ADMISSION DATE:  07/03/2018, CONSULTATION DATE:  6/18 REFERRING MD:  Dr. Karleen Hampshire, CHIEF COMPLAINT:  VF arrest   Brief History   48 year old female admitted 6/16 for chest pain and atrial flutter. Early hours of 6/18 suffered VF/Torsades arrest.   History of present illness   48 year old female with PMH as below, which is significant for breast CA (s/p mastectomy and chemo), HFrEF, COPD, and polysubstance abuse. She was admitted to [facility] on 6/16 after presenting to the ED with complaints of SOB x 3 days as well as chest and rib pain related to work of breathing. Also complaining of lower extremity edema. She was found to be in AF with RVR. This responded well to diltiazem infusion and she was admitted to tho hospitalists team on heparin infusion. She was seen in consultation by cardiology who have been diuresing her and trying to find the best treatment options for her AF given her substance abuse and medical non-compliance.   In the early AM hours she was reportedly acting normal and suddenly became unresponsive and was noted to be in torsades/VF. ACLS was initiated and continued for 5 minutes total. She was given mag and defibrillated x1 with ROSC. She was intubated by anesthesia and transferred to ICU.   Past Medical History   has a past medical history of Atrial flutter ([place]) (07/03/2018), Breast cancer, stage 2 ([place]) (02/23/2011), Chronic combined systolic and diastolic CHF (congestive heart failure) ([place]), CKD (chronic kidney disease), stage II, Cocaine use, COPD (chronic obstructive pulmonary disease) ([place]), Homelessness, Hypertension, Polysubstance abuse ([place]), Poor social situation, and TIA (transient ischemic attack) (11/25/2011).  Significant Hospital Events   6/16 admit 6/18 Arrest, intubated, tx to ICU.   Consults:  Cardiology  Procedures:  6/18 ETT  Significant Diagnostic Tests:    Micro  Data:    Antimicrobials:     Interim history/subjective:    Objective   Blood pressure (!) 116/93, pulse (!) 120, temperature 97.6 F (36.4 C), temperature source Oral, resp. rate (!) 21, height 5\' 5"  (1.651 m), weight 58 kg, last menstrual period 02/18/2015, SpO2 98 %.    Vent Mode: PRVC FiO2 (%):  [100 %] 100 % Set Rate:  [16 bmp] 16 bmp Vt Set:  [450 mL] 450 mL PEEP:  [5 cmH20] 5 cmH20 Plateau Pressure:  [12 cmH20] 12 cmH20   Intake/Output Summary (Last 24 hours) at 07/05/2018 0350 Last data filed at 07/05/2018 0000 Gross per 24 hour  Intake 1383 ml  Output 1050 ml  Net 333 ml   Filed Weights   07/03/18 0631 07/04/18 0446  Weight: 57.6 kg 58 kg    Examination: General: frail middle aged female on vent HENT: [place]/AT, PERRL, JVD Lungs: Clear Cardiovascular: Tachy, regular, no MRG Abdomen: Soft, distended. Non-tender Extremities: No acute deformity or ROM limitation Neuro: Very agitated, not cooperative or following commands. Flailing and fighting with staff.   Resolved Hospital Problem list     Assessment & Plan:   Cardiac arrest: Torsades de pointes and VF. Responded to mag and defibrillation. - Transfer to ICU - Tele monitoring - Check BMP, Mag - No clear signs of ischemia on EKG - Trend troponin - Cardiology following.  - Defer TTM based on mental status and short CPR duration with immediate response time.  - Will give K to meet goal >4  Acute respiratory failure: unspecified at this point ABG pending.  -  Full vent support - CXR - ABG - VAP bundle - SBT in AM - Fentanyl infusion for sedation. RASS goal 0 to -2.  COPD without acute exacerbation - SpO2 goal 90-95% - hold dulera, incruse. Start duoneb and budesonide.   Atrial flutter with RVR HFrEF - management per cardiology - Diltiazem infusion  Polysubstance abuse (history of cocaine, opiates, THC, and ETOH) - has agreed to rehab once cleared - Thiamine and folic acid.  - Supportive care.     Best practice:  Diet: NPO Pain/Anxiety/Delirium protocol (if indicated): Fentanyl VAP protocol (if indicated): Per protocol DVT prophylaxis: enoxaparin GI prophylaxis: PPI Glucose control:  Mobility: BR Code Status: FULL Family Communication: Family unable to be contact via contact info in EMR Disposition: ICU  Labs   CBC: Recent Labs  Lab 07/03/18 0501 07/04/18 0918 07/05/18 0319  WBC 5.8 4.8 7.0  NEUTROABS 2.9 2.3  --   HGB 13.4 11.6* 12.5  HCT 42.4 34.9* 36.8  MCV 74.1* 71.2* 70.4*  PLT 241 241 532    Basic Metabolic Panel: Recent Labs  Lab 07/03/18 0501 07/04/18 0018  NA 137 133*  K 4.0 3.5  CL 99 98  CO2 23 22  GLUCOSE 111* 130*  BUN 26* 29*  CREATININE 1.35* 1.34*  CALCIUM 9.6 9.6  MG 1.8  --    GFR: Estimated Creatinine Clearance: 45.2 mL/min (A) (by C-G formula based on SCr of 1.34 mg/dL (H)). Recent Labs  Lab 07/03/18 0501 07/04/18 0918 07/05/18 0319  WBC 5.8 4.8 7.0    Liver Function Tests: Recent Labs  Lab 07/03/18 0501  AST 31  ALT 17  ALKPHOS 79  BILITOT 3.7*  PROT 7.3  ALBUMIN 3.5   No results for input(s): LIPASE, AMYLASE in the last 168 hours. No results for input(s): AMMONIA in the last 168 hours.  ABG    Component Value Date/Time   PHART 7.433 04/16/2018 0149   PCO2ART 40.6 04/16/2018 0149   PO2ART 90.0 04/16/2018 0149   HCO3 27.2 04/16/2018 0149   TCO2 28 04/16/2018 0149   ACIDBASEDEF 1.6 12/02/2017 2321   O2SAT 97.0 04/16/2018 0149     Coagulation Profile: No results for input(s): INR, PROTIME in the last 168 hours.  Cardiac Enzymes: Recent Labs  Lab 07/03/18 0501 07/03/18 1818 07/04/18 0018 07/04/18 0918  TROPONINI 0.04* 0.06* 0.06* 0.06*    HbA1C: Hgb A1c MFr Bld  Date/Time Value Ref Range Status  07/03/2018 06:18 PM 6.3 (H) 4.8 - 5.6 % Final    Comment:    (NOTE) Pre diabetes:          5.7%-6.4% Diabetes:              >6.4% Glycemic control for   <7.0% adults with diabetes   11/25/2011  08:38 AM 5.2 <5.7 % Final    Comment:    (NOTE)                                                                       According to the ADA Clinical Practice Recommendations for 2011, when HbA1c is used as a screening test:  >=6.5%   Diagnostic of Diabetes Mellitus           (if abnormal result is confirmed)  5.7-6.4%   Increased risk of developing Diabetes Mellitus References:Diagnosis and Classification of Diabetes Mellitus,Diabetes GLOV,5643,32(RJJOA 1):S62-S69 and Standards of Medical Care in         Diabetes - 2011,Diabetes CZYS,0630,16 (Suppl 1):S11-S61.    CBG: No results for input(s): GLUCAP in the last 168 hours.  Review of Systems:   Unable as patient is encephalopathic and intubated  Past Medical History  She,  has a past medical history of Atrial flutter ([place]) (07/03/2018), Breast cancer, stage 2 ([place]) (02/23/2011), Chronic combined systolic and diastolic CHF (congestive heart failure) ([place]), CKD (chronic kidney disease), stage II, Cocaine use, COPD (chronic obstructive pulmonary disease) ([place]), Homelessness, Hypertension, Polysubstance abuse ([place]), Poor social situation, and TIA (transient ischemic attack) (11/25/2011).   Surgical History    Past Surgical History:  Procedure Laterality Date  . BREAST BIOPSY  2007   left  . MASTECTOMY  09/2005   left  . TONSILLECTOMY AND ADENOIDECTOMY     "when I was little" (11/25/2011)  . [place]     Social History   reports that she has been smoking cigarettes. She has a 150.00 pack-year smoking history. She has never used smokeless tobacco. She reports previous alcohol use of about 12.0 standard drinks of alcohol per week. She reports current drug use. Drugs: Marijuana and Cocaine.   Family History   Her family history includes COPD in her father; Sickle cell anemia in her cousin.   Allergies Allergies  Allergen Reactions  . Penicillins Other (See Comments)    Pt had slight throat swelling Has patient had a PCN  reaction causing immediate rash, facial/tongue/throat swelling, SOB or lightheadedness with hypotension: yes Has patient had a PCN reaction causing severe rash involving mucus membranes or skin necrosis: No Has patient had a PCN reaction that required hospitalization No Has patient had a PCN reaction occurring within the last 10 years: No If all of the above answers are "NO", then may proceed with Cephalosporin use.      Home Medications  Prior to Admission medications   Medication Sig Start Date End Date Taking? Authorizing Provider  albuterol (PROVENTIL HFA) 108 (90 Base) MCG/ACT inhaler Inhale 1-2 puffs into the lungs every 6 (six) hours as needed for up to 1 day for wheezing or shortness of breath (cough). 06/18/18 07/03/27 Yes Kilroy, Luke K, PA-C  albuterol (PROVENTIL) (2.5 MG/3ML) 0.083% nebulizer solution Take 3 mLs (2.5 mg total) by nebulization every 6 (six) hours as needed for wheezing or shortness of breath. 06/18/18  Yes Kilroy, Luke K, PA-C  amLODipine (NORVASC) 5 MG tablet Take 1 tablet (5 mg total) by mouth daily. 03/05/18 07/03/27 Yes Gildardo Pounds, NP  aspirin EC 81 MG EC tablet Take 1 tablet (81 mg total) by mouth daily. 06/24/17  Yes Reyne Dumas, MD  atorvastatin (LIPITOR) 40 MG tablet Take 1 tablet (40 mg total) by mouth daily at 6 PM. 03/05/18  Yes Gildardo Pounds, NP  budesonide-formoterol (SYMBICORT) 80-4.5 MCG/ACT inhaler Inhale 2 puffs into the lungs 2 (two) times daily. 05/08/18  Yes Gildardo Pounds, NP  cyclobenzaprine (FLEXERIL) 10 MG tablet Take 1 tablet (10 mg total) by mouth 2 (two) times daily as needed for up to 10 doses for muscle spasms. 04/02/18  Yes Curatolo, Adam, DO  diclofenac sodium (VOLTAREN) 1 % GEL Apply 2 g topically 4 (four) times daily. 04/23/18  Yes Gildardo Pounds, NP  ferrous sulfate 325 (65 FE) MG tablet Take 1 tablet (325 mg total) by  mouth 2 (two) times daily with a meal. 04/18/18  Yes Smith, Rondell A, MD  fluticasone (FLONASE) 50 MCG/ACT nasal spray  Place 2 sprays into both nostrils daily. 12/05/17  Yes Eugenie Filler, MD  furosemide (LASIX) 20 MG tablet Take 3 tablets (60 mg total) by mouth 2 (two) times daily for 30 days. 04/23/18 07/03/27 Yes Gildardo Pounds, NP  lidocaine (LIDODERM) 5 % Place 1 patch onto the skin daily. Remove & Discard patch within 12 hours or as directed by MD 04/29/18  Yes Jerilee Hoh, Ana P, PA-C  lisinopril (PRINIVIL,ZESTRIL) 20 MG tablet Take 1 tablet (20 mg total) by mouth daily. 03/05/18 07/03/27 Yes Gildardo Pounds, NP  metolazone (ZAROXOLYN) 2.5 MG tablet Take 1 tablet on Wednesday and Saturdays Patient taking differently: Take 2.5 mg by mouth See admin instructions. Take 1 tablet on Wednesday and Saturdays 06/18/18  Yes Kilroy, Doreene Burke, PA-C  Multiple Vitamin (MULTIVITAMIN WITH MINERALS) TABS tablet Take 1 tablet by mouth daily.   Yes [provider]  omeprazole (PRILOSEC) 20 MG capsule Take 1 capsule (20 mg total) by mouth daily. 03/05/18 07/03/27 Yes Gildardo Pounds, NP  senna-docusate (SENOKOT-S) 8.6-50 MG tablet Take 1 tablet by mouth 2 (two) times daily as needed for mild constipation. 04/18/18  Yes Norval Morton, MD  spironolactone (ALDACTONE) 25 MG tablet Take 1 tablet (25 mg total) by mouth daily. 04/23/18 07/22/18 Yes Gildardo Pounds, NP  tiotropium (SPIRIVA HANDIHALER) 18 MCG inhalation capsule Place 1 capsule (18 mcg total) into inhaler and inhale daily. 03/05/18 03/05/19 Yes Gildardo Pounds, NP     Critical care time: 35 mins     Georgann Housekeeper, [facility] Pager [phone] or [phone]  07/05/2018 4:23 AM

## 2018-07-05 NOTE — Progress Notes (Addendum)
Progress Note  Patient Name: Sabrina Holland Date of Encounter: 07/05/2018  Primary Cardiologist: Sanda Klein, MD   Subjective   Pt awake  INtubated    Inpatient Medications    Scheduled Meds: . aspirin EC  81 mg Oral Daily  . atorvastatin  40 mg Oral q1800  . budesonide (PULMICORT) nebulizer solution  0.5 mg Nebulization BID  . enoxaparin (LOVENOX) injection  60 mg Subcutaneous Q12H  . folic acid  1 mg Intravenous Daily  . ipratropium-albuterol  3 mL Nebulization Q6H  . multivitamin with minerals  1 tablet Oral Daily  . pantoprazole (PROTONIX) IV  40 mg Intravenous Daily  . potassium chloride  10 mEq Oral Daily  . sodium chloride flush  3 mL Intravenous Q12H  . thiamine injection  100 mg Intravenous Daily   Continuous Infusions: . sodium chloride Stopped (07/05/18 0503)  . diltiazem (CARDIZEM) infusion Stopped (07/05/18 0300)  . fentaNYL infusion INTRAVENOUS 75 mcg/hr (07/05/18 0600)  . potassium chloride 10 mEq (07/05/18 0629)   PRN Meds: sodium chloride, acetaminophen, albuterol, cyclobenzaprine, fentaNYL, ondansetron (ZOFRAN) IV, senna-docusate, sodium chloride flush   Vital Signs    Vitals:   07/05/18 0600 07/05/18 0615 07/05/18 0630 07/05/18 0645  BP: 103/85 102/87 101/85 101/83  Pulse: 100 (!) 103 99 98  Resp: 16 19 (!) 34 11  Temp:      TempSrc:      SpO2: 99% 99% 99% 99%  Weight:      Height:        Intake/Output Summary (Last 24 hours) at 07/05/2018 0715 Last data filed at 07/05/2018 0600 Gross per 24 hour  Intake 1352.52 ml  Output 950 ml  Net 402.52 ml    +664 cc   Last 3 Weights 07/05/2018 07/04/2018 07/03/2018  Weight (lbs) 128 lb 15.5 oz 127 lb 13.9 oz 126 lb 15.8 oz  Weight (kg) 58.5 kg 58 kg 57.6 kg      Telemetry    Strips reviewed   2:33 Torsades the VF  Pt transiently in SR/Ectopic atrial rhythm.  Now appears back in atrial flutter   - Personally Reviewed  ECG    Ectopic atrial rhythm  100 bpm  QTc 485 - Personally  Reviewed  Physical Exam   GEN: Pt intubated     Neck: difficult to asses JVP due to intubation  Cardiac: Tachy  no murmurs, rubs, or gallops.  Respiratory: Coarse BS   GI: Soft  + BS   MS: No edema; No deformity. Neuro:  Nonfocal    Labs    Chemistry Recent Labs  Lab 07/03/18 0501 07/04/18 0018 07/05/18 0319 07/05/18 0416  NA 137 133* 130* 131*  K 4.0 3.5 3.5 3.4*  CL 99 98 96*  --   CO2 23 22 21*  --   GLUCOSE 111* 130* 136*  --   BUN 26* 29* 31*  --   CREATININE 1.35* 1.34* 1.50*  --   CALCIUM 9.6 9.6 9.5  --   PROT 7.3  --   --   --   ALBUMIN 3.5  --   --   --   AST 31  --   --   --   ALT 17  --   --   --   ALKPHOS 79  --   --   --   BILITOT 3.7*  --   --   --   GFRNONAA 46* 46* 40*  --   GFRAA 53* 53* 47*  --  ANIONGAP 15 13 13   --      Hematology Recent Labs  Lab 07/03/18 0501 07/04/18 0918 07/05/18 0319 07/05/18 0416  WBC 5.8 4.8 7.0  --   RBC 5.72* 4.90 5.23*  --   HGB 13.4 11.6* 12.5 15.0  HCT 42.4 34.9* 36.8 44.0  MCV 74.1* 71.2* 70.4*  --   MCH 23.4* 23.7* 23.9*  --   MCHC 31.6 33.2 34.0  --   RDW 27.1* 26.0* 25.7*  --   PLT 241 241 254  --     Cardiac Enzymes Recent Labs  Lab 07/03/18 0501 07/03/18 1818 07/04/18 0018 07/04/18 0918  TROPONINI 0.04* 0.06* 0.06* 0.06*   No results for input(s): TROPIPOC in the last 168 hours.   BNP Recent Labs  Lab 07/03/18 0503  BNP 2,450.2*     DDimer No results for input(s): DDIMER in the last 168 hours.   Radiology    Dg Chest Port 1 View  Result Date: 07/05/2018 CLINICAL DATA:  Endotracheal tube adjustment.  Placement of OG tube. EXAM: PORTABLE CHEST 1 VIEW COMPARISON:  One-view chest x-ray 07/05/2018 at 4:16 a.m. FINDINGS: The endotracheal tube has been advanced and now terminates 5.2 cm above the carina. A new OG tube courses off the inferior border of the film. The heart is enlarged. Left pleural effusion and retrocardiac airspace opacity is stable. Minimal atelectasis at the right  base is stable. Defibrillator pads are stable. IMPRESSION: 1. Endotracheal tube advanced 1 cm, now 5 cm above the carina. 2. New OG tube courses off the inferior border the film. 3. Stable cardiomegaly with retrocardiac opacities as previously described. Electronically Signed   By: San Morelle M.D.   On: 07/05/2018 05:41   Dg Chest Port 1 View  Result Date: 07/05/2018 CLINICAL DATA:  Status post intubation. EXAM: PORTABLE CHEST 1 VIEW COMPARISON:  One-view chest x-ray 07/03/2018. FINDINGS: Patient has been intubated. The endotracheal tube air terminates 6 cm above the carina, at the level clavicles. Defibrillator pads remain in place. The heart is enlarged. There is no edema or effusion. Retrocardiac opacification is stable. The lungs are otherwise clear. IMPRESSION: 1. Satisfactory positioning of the endotracheal tube, 6 cm above the carina. 2. Stable cardiomegaly without failure. Electronically Signed   By: San Morelle M.D.   On: 07/05/2018 04:52    Cardiac Studies     Patient Profile     48 y.o. 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 Lorin Mercy.  Assessment & Plan    1  Cardiac arrest REviewed strips near time of event   Will try to track min before   Did develop TdP  Then VF    Mg given.  Converted to SR     EKG from 7:54  Ectopic atrial rhythm  QTc 485  Not sure what acutely precipitated    Electrolytes not that abnormal yesterday   Will d/c flexeril  REviewed with EP   She is not  Candidate for device given refusal for care in past and continued cocaine use    Would recomm b blocker Rx if she does not use cocaine  Will start low dose b  blocker for now (lopressor  IV 2.5 q 6 hours) Avoid QT prolonging drugs     2  Acute on chronic systolicCHF Will place PICC today and get COOx Pt hs never had an ischemic evaluation  (CT, cath)   Will need to review when recovers  Would do better with b blocker but again, actively using cocain  2  Atrial flutter  Did convert for a perior last night   Will control rates    She would do better in SR but I am concerned about compliance with anticoag as outpt    3  CKD  Cr 1.5 today    4  Pulm  CCM following now that intubated   Sedate if agitate    4  Polysubstance abuse  Needs rehab    Additional 30 min of critical care time spent with pt   For questions or updates, please contact [place] Please consult [email or web address] for contact info under        Signed, Dorris Carnes, MD  07/05/2018, 7:15 AM

## 2018-07-06 ENCOUNTER — Inpatient Hospital Stay (HOSPITAL_COMMUNITY): Payer: Medicaid Other

## 2018-07-06 DIAGNOSIS — R609 Edema, unspecified: Secondary | ICD-10-CM

## 2018-07-06 DIAGNOSIS — I5023 Acute on chronic systolic (congestive) heart failure: Secondary | ICD-10-CM

## 2018-07-06 DIAGNOSIS — R570 Cardiogenic shock: Secondary | ICD-10-CM

## 2018-07-06 DIAGNOSIS — J9601 Acute respiratory failure with hypoxia: Secondary | ICD-10-CM

## 2018-07-06 LAB — COOXEMETRY PANEL
Carboxyhemoglobin: 1.7 % — ABNORMAL HIGH (ref 0.5–1.5)
Carboxyhemoglobin: 2 % — ABNORMAL HIGH (ref 0.5–1.5)
Carboxyhemoglobin: 1.5 % (ref 0.5–1.5)
Methemoglobin: 0.5 % (ref 0.0–1.5)
Methemoglobin: 1 % (ref 0.0–1.5)
Methemoglobin: 0.7 % (ref 0.0–1.5)
O2 Saturation: 49.1 %
O2 Saturation: 81.7 %
O2 Saturation: 74.3 %
Total hemoglobin: 12.2 g/dL (ref 12.0–16.0)
Total hemoglobin: 11.1 g/dL — ABNORMAL LOW (ref 12.0–16.0)
Total hemoglobin: 11 g/dL — ABNORMAL LOW (ref 12.0–16.0)

## 2018-07-06 LAB — POCT I-STAT 7, (LYTES, BLD GAS, ICA,H+H)
Acid-base deficit: 5 mmol/L — ABNORMAL HIGH (ref 0.0–2.0)
Bicarbonate: 20.1 mmol/L (ref 20.0–28.0)
Calcium, Ion: 1.24 mmol/L (ref 1.15–1.40)
HCT: 47 % — ABNORMAL HIGH (ref 36.0–46.0)
Hemoglobin: 16 g/dL — ABNORMAL HIGH (ref 12.0–15.0)
O2 Saturation: 86 %
Patient temperature: 98.7
Potassium: 4.7 mmol/L (ref 3.5–5.1)
Sodium: 130 mmol/L — ABNORMAL LOW (ref 135–145)
TCO2: 21 mmol/L — ABNORMAL LOW (ref 22–32)
pCO2 arterial: 37.9 mmHg (ref 32.0–48.0)
pH, Arterial: 7.333 — ABNORMAL LOW (ref 7.350–7.450)
pO2, Arterial: 55 mmHg — ABNORMAL LOW (ref 83.0–108.0)

## 2018-07-06 LAB — BASIC METABOLIC PANEL
Anion gap: 10 (ref 5–15)
Anion gap: 11 (ref 5–15)
BUN: 36 mg/dL — ABNORMAL HIGH (ref 6–20)
BUN: 41 mg/dL — ABNORMAL HIGH (ref 6–20)
CO2: 23 mmol/L (ref 22–32)
CO2: 24 mmol/L (ref 22–32)
Calcium: 9.3 mg/dL (ref 8.9–10.3)
Calcium: 8.9 mg/dL (ref 8.9–10.3)
Chloride: 97 mmol/L — ABNORMAL LOW (ref 98–111)
Chloride: 95 mmol/L — ABNORMAL LOW (ref 98–111)
Creatinine, Ser: 1.94 mg/dL — ABNORMAL HIGH (ref 0.44–1.00)
Creatinine, Ser: 2.39 mg/dL — ABNORMAL HIGH (ref 0.44–1.00)
GFR calc Af Amer: 34 mL/min — ABNORMAL LOW (ref >60)
GFR calc Af Amer: 27 mL/min — ABNORMAL LOW (ref >60)
GFR calc non Af Amer: 29 mL/min — ABNORMAL LOW (ref >60)
GFR calc non Af Amer: 23 mL/min — ABNORMAL LOW (ref >60)
Glucose, Bld: 119 mg/dL — ABNORMAL HIGH (ref 70–99)
Glucose, Bld: 184 mg/dL — ABNORMAL HIGH (ref 70–99)
Potassium: 4.7 mmol/L (ref 3.5–5.1)
Potassium: 4.4 mmol/L (ref 3.5–5.1)
Sodium: 130 mmol/L — ABNORMAL LOW (ref 135–145)
Sodium: 130 mmol/L — ABNORMAL LOW (ref 135–145)

## 2018-07-06 LAB — BLOOD GAS, ARTERIAL
Acid-base deficit: 1.7 mmol/L (ref 0.0–2.0)
Bicarbonate: 23.1 mmol/L (ref 20.0–28.0)
Drawn by: 51185
FIO2: 80
MECHVT: 450 mL
O2 Saturation: 99.5 %
PEEP: 8 cmH2O
Patient temperature: 98.6
RATE: 16 resp/min
pCO2 arterial: 42.4 mmHg (ref 32.0–48.0)
pH, Arterial: 7.355 (ref 7.350–7.450)
pO2, Arterial: 150 mmHg — ABNORMAL HIGH (ref 83.0–108.0)

## 2018-07-06 LAB — HEPATIC FUNCTION PANEL
ALT: 19 U/L (ref 0–44)
AST: 42 U/L — ABNORMAL HIGH (ref 15–41)
Albumin: 3.2 g/dL — ABNORMAL LOW (ref 3.5–5.0)
Alkaline Phosphatase: 75 U/L (ref 38–126)
Bilirubin, Direct: 1.6 mg/dL — ABNORMAL HIGH (ref 0.0–0.2)
Indirect Bilirubin: 1.2 mg/dL — ABNORMAL HIGH (ref 0.3–0.9)
Total Bilirubin: 2.8 mg/dL — ABNORMAL HIGH (ref 0.3–1.2)
Total Protein: 6.6 g/dL (ref 6.5–8.1)

## 2018-07-06 LAB — LACTIC ACID, PLASMA
Lactic Acid, Venous: 2.1 mmol/L (ref 0.5–1.9)
Lactic Acid, Venous: 1 mmol/L (ref 0.5–1.9)

## 2018-07-06 LAB — MAGNESIUM
Magnesium: 2.7 mg/dL — ABNORMAL HIGH (ref 1.7–2.4)
Magnesium: 2.5 mg/dL — ABNORMAL HIGH (ref 1.7–2.4)

## 2018-07-06 LAB — PHOSPHORUS: Phosphorus: 5.3 mg/dL — ABNORMAL HIGH (ref 2.5–4.6)

## 2018-07-06 LAB — CK TOTAL AND CKMB (NOT AT ARMC)
CK, MB: 2.9 ng/mL (ref 0.5–5.0)
Relative Index: 2.1 (ref 0.0–2.5)
Total CK: 140 U/L (ref 38–234)

## 2018-07-06 MED ORDER — HEPARIN (PORCINE) 25000 UT/250ML-% IV SOLN
900.0000 [IU]/h | INTRAVENOUS | Status: DC
  Administered 2018-07-07 – 2018-07-08 (×2): 750 [IU]/h via INTRAVENOUS
  Administered 2018-07-09: 900 [IU]/h via INTRAVENOUS
  Filled 2018-07-06 (×4): qty 250

## 2018-07-06 MED ORDER — ASPIRIN 81 MG PO CHEW
81.0000 mg | CHEWABLE_TABLET | Freq: Every day | ORAL | Status: DC
  Administered 2018-07-06: 81 mg
  Filled 2018-07-06 (×2): qty 1

## 2018-07-06 MED ORDER — ADULT MULTIVITAMIN LIQUID CH
15.0000 mL | Freq: Every day | ORAL | Status: DC
  Administered 2018-07-06: 15 mL
  Filled 2018-07-06 (×2): qty 15

## 2018-07-06 MED ORDER — FUROSEMIDE 10 MG/ML IJ SOLN
80.0000 mg | Freq: Two times a day (BID) | INTRAMUSCULAR | Status: DC
  Administered 2018-07-07 – 2018-07-08 (×3): 80 mg via INTRAVENOUS
  Filled 2018-07-06 (×4): qty 8

## 2018-07-06 MED ORDER — FUROSEMIDE 10 MG/ML IJ SOLN
80.0000 mg | Freq: Once | INTRAMUSCULAR | Status: AC
  Administered 2018-07-06: 80 mg via INTRAVENOUS
  Filled 2018-07-06: qty 8

## 2018-07-06 MED ORDER — ENOXAPARIN SODIUM 60 MG/0.6ML ~~LOC~~ SOLN
60.0000 mg | SUBCUTANEOUS | Status: DC
  Filled 2018-07-06: qty 0.6

## 2018-07-06 MED ORDER — MILRINONE LACTATE IN DEXTROSE 20-5 MG/100ML-% IV SOLN
0.1250 ug/kg/min | INTRAVENOUS | Status: DC
  Administered 2018-07-06 – 2018-07-07 (×2): 0.25 ug/kg/min via INTRAVENOUS
  Administered 2018-07-08 – 2018-07-10 (×2): 0.125 ug/kg/min via INTRAVENOUS
  Filled 2018-07-06 (×4): qty 100

## 2018-07-06 NOTE — Progress Notes (Signed)
Assisted family with video chat via elink.  

## 2018-07-06 NOTE — Progress Notes (Signed)
Progress Note  Patient Name: Sabrina Holland Date of Encounter: 07/06/2018  Primary Cardiologist: Sanda Klein, MD   Subjective   Patient sedated   Intubated    Inpatient Medications    Scheduled Meds: . aspirin EC  81 mg Oral Daily  . atorvastatin  40 mg Oral q1800  . budesonide (PULMICORT) nebulizer solution  0.5 mg Nebulization BID  . chlorhexidine gluconate (MEDLINE KIT)  15 mL Mouth Rinse BID  . Chlorhexidine Gluconate Cloth  6 each Topical Daily  . Chlorhexidine Gluconate Cloth  6 each Topical Daily  . enoxaparin (LOVENOX) injection  60 mg Subcutaneous Q12H  . folic acid  1 mg Intravenous Daily  . ipratropium-albuterol  3 mL Nebulization Q6H  . mouth rinse  15 mL Mouth Rinse 10 times per day  . metoprolol tartrate  2.5 mg Intravenous Q6H  . multivitamin with minerals  1 tablet Oral Daily  . pantoprazole (PROTONIX) IV  40 mg Intravenous Daily  . potassium chloride  10 mEq Oral Daily  . sodium chloride flush  10-40 mL Intracatheter Q12H  . sodium chloride flush  3 mL Intravenous Q12H  . thiamine injection  100 mg Intravenous Daily   Continuous Infusions: . sodium chloride Stopped (07/05/18 1652)  . amiodarone 30 mg/hr (07/06/18 0600)  . feeding supplement (VITAL AF 1.2 CAL) 1,000 mL (07/05/18 1401)  . fentaNYL infusion INTRAVENOUS 175 mcg/hr (07/06/18 0610)   PRN Meds: sodium chloride, acetaminophen, albuterol, fentaNYL, ondansetron (ZOFRAN) IV, senna-docusate, sodium chloride flush, sodium chloride flush   Vital Signs    Vitals:   07/06/18 0515 07/06/18 0545 07/06/18 0600 07/06/18 0615  BP: 100/72 104/89 106/77 106/77  Pulse: (!) 101 98 99 96  Resp: 17 (!) '21 19 17  ' Temp:      TempSrc:      SpO2: 99% 97% 98% 98%  Weight:      Height:        Intake/Output Summary (Last 24 hours) at 07/06/2018 0647 Last data filed at 07/06/2018 0600 Gross per 24 hour  Intake 2453.37 ml  Output 550 ml  Net 1903.37 ml    2.6 L  positive    Last 3 Weights  07/06/2018 07/05/2018 07/04/2018  Weight (lbs) 126 lb 12.2 oz 128 lb 15.5 oz 127 lb 13.9 oz  Weight (kg) 57.5 kg 58.5 kg 58 kg      Telemetry    Sinus rhythm yesterday now Atrial flutter   ECG     - Personally Reviewed  Physical Exam   GEN: Pt intubated     Neck: difficult to asses JVP due to intubation  Cardiac: Tachy  no murmurs, rubs, or gallops.  Respiratory: Coarse BS   GI: Abdomen is distended   Firm    MS: No edema;  Feet cool   No deformity. Neuro:  Sedated  Intubated     Labs    Chemistry Recent Labs  Lab 07/03/18 0501 07/04/18 0018 07/05/18 0319 07/05/18 0416 07/05/18 2358 07/06/18 0018  NA 137 133* 130* 131* 130* 130*  K 4.0 3.5 3.5 3.4* 4.7 4.7  CL 99 98 96*  --   --  97*  CO2 23 22 21*  --   --  23  GLUCOSE 111* 130* 136*  --   --  119*  BUN 26* 29* 31*  --   --  36*  CREATININE 1.35* 1.34* 1.50*  --   --  1.94*  CALCIUM 9.6 9.6 9.5  --   --  9.3  PROT 7.3  --   --   --   --   --   ALBUMIN 3.5  --   --   --   --   --   AST 31  --   --   --   --   --   ALT 17  --   --   --   --   --   ALKPHOS 79  --   --   --   --   --   BILITOT 3.7*  --   --   --   --   --   GFRNONAA 46* 46* 40*  --   --  29*  GFRAA 53* 53* 47*  --   --  34*  ANIONGAP '15 13 13  ' --   --  10     Hematology Recent Labs  Lab 07/03/18 0501 07/04/18 0918 07/05/18 0319 07/05/18 0416 07/05/18 2358  WBC 5.8 4.8 7.0  --   --   RBC 5.72* 4.90 5.23*  --   --   HGB 13.4 11.6* 12.5 15.0 16.0*  HCT 42.4 34.9* 36.8 44.0 47.0*  MCV 74.1* 71.2* 70.4*  --   --   MCH 23.4* 23.7* 23.9*  --   --   MCHC 31.6 33.2 34.0  --   --   RDW 27.1* 26.0* 25.7*  --   --   PLT 241 241 254  --   --     Cardiac Enzymes Recent Labs  Lab 07/04/18 0918 07/05/18 0704 07/05/18 1007 07/05/18 1543  TROPONINI 0.06* 0.08* 0.08* 0.08*   No results for input(s): TROPIPOC in the last 168 hours.   BNP Recent Labs  Lab 07/03/18 0503  BNP 2,450.2*     DDimer No results for input(s): DDIMER in the last  168 hours.   Radiology    Dg Chest Port 1 View  Result Date: 07/06/2018 CLINICAL DATA:  Hypoxia. EXAM: PORTABLE CHEST 1 VIEW COMPARISON:  Radiograph yesterday. FINDINGS: Endotracheal tube tip at the thoracic inlet approximately 4.7 cm from the carina. Enteric tube in place with tip below the diaphragm not included in the field of view. Right upper extremity PICC in place with tip in the lower SVC. Cardiomegaly is unchanged. Left lung base opacity likely combination of pleural fluid and airspace disease, unchanged. Slight increase in right lung base atelectasis. No frank pulmonary edema. No pneumothorax. IMPRESSION: 1. Endotracheal tube tip at the thoracic inlet 4.7 cm from the carina. Enteric tube in place. Right upper extremity PICC tip in the lower SVC. 2. Unchanged left lung base opacity likely combination of pleural fluid and airspace disease. Slight increase in right basilar atelectasis. Cardiomegaly is unchanged. Electronically Signed   By: Keith Rake M.D.   On: 07/06/2018 00:47   Dg Chest Port 1 View  Result Date: 07/05/2018 CLINICAL DATA:  Endotracheal tube adjustment.  Placement of OG tube. EXAM: PORTABLE CHEST 1 VIEW COMPARISON:  One-view chest x-ray 07/05/2018 at 4:16 a.m. FINDINGS: The endotracheal tube has been advanced and now terminates 5.2 cm above the carina. A new OG tube courses off the inferior border of the film. The heart is enlarged. Left pleural effusion and retrocardiac airspace opacity is stable. Minimal atelectasis at the right base is stable. Defibrillator pads are stable. IMPRESSION: 1. Endotracheal tube advanced 1 cm, now 5 cm above the carina. 2. New OG tube courses off the inferior border the film. 3. Stable cardiomegaly with retrocardiac opacities as  previously described. Electronically Signed   By: San Morelle M.D.   On: 07/05/2018 05:41   Dg Chest Port 1 View  Result Date: 07/05/2018 CLINICAL DATA:  Status post intubation. EXAM: PORTABLE CHEST 1 VIEW  COMPARISON:  One-view chest x-ray 07/03/2018. FINDINGS: Patient has been intubated. The endotracheal tube air terminates 6 cm above the carina, at the level clavicles. Defibrillator pads remain in place. The heart is enlarged. There is no edema or effusion. Retrocardiac opacification is stable. The lungs are otherwise clear. IMPRESSION: 1. Satisfactory positioning of the endotracheal tube, 6 cm above the carina. 2. Stable cardiomegaly without failure. Electronically Signed   By: San Morelle M.D.   On: 07/05/2018 04:52   Dg Abd Portable 1v  Result Date: 07/05/2018 CLINICAL DATA:  Abdominal distension. EXAM: PORTABLE ABDOMEN - 1 VIEW COMPARISON:  None. FINDINGS: The bowel gas pattern is normal. No radio-opaque calculi or other significant radiographic abnormality are seen. IMPRESSION: Negative exam. Electronically Signed   By: Inge Rise M.D.   On: 07/05/2018 07:28   [place] Ekg Site Rite  Result Date: 07/05/2018 If Site Rite image not attached, placement could not be confirmed due to current cardiac rhythm.   Cardiac Studies   Echo:  07/05/18 1. The left ventricle has a 2D calculated ejection fraction 15%. The cavity size was normal. Left ventricular diastolic Doppler parameters are indeterminate. Left ventricular diffuse hypokinesis.  2. The right ventricle has moderately reduced systolic function. The cavity was mildly enlarged. There is no increase in right ventricular wall thickness. Right ventricular systolic pressure is mildly elevated with an estimated pressure of 24.8 mmHg.  3. Left atrial size was severely dilated.  4. Right atrial size was severely dilated.  5. Mild thickening of the mitral valve leaflet. Mitral valve regurgitation is moderate by color flow Doppler.  6. Tricuspid valve regurgitation is severe.  7. The inferior vena cava was dilated in size with <50% respiratory variability.   Patient Profile     48 y.o. female with a hx of ongoing polysubstance abuse (h/o  cocaine, opiates, THC, tobacco abuse, prior habitual ETOH), poor social situation, chronic combined CHF (no prior ischemic w/u), HTN, mitral regurgitation, HLD, CKD II by labs, breast CA (s/p L mastectomy, chemo, tamoxifen), TIA, COPD who is being seen today for the evaluation of CHF and atrial flutter at the request of Dr. Lorin Mercy.  Assessment & Plan    1  Acute on chronic systolic CHF  Echo yesterday, LVEF is worse.  Patient appears in low output state with atrial flutter exacerbating     Pt now intubated   Requiring high FIO2/PEEP AM labs pending  Feet cool  Urine output low    Will give lasix this AM   COOX pending  Review prior to further change in drugs  2  Rhythm  Yesterday at 2 AM had TdP   Nothing clearly inciting   After that held SR/Ectopic atrial rhythm for a bit  Then reverted back to atrial flutter with RVR Amiodarone started last night   Rates are better   She is on anticoagulation as well   Reviewed with EP  Not a candidate for device/intervention for rhythms given cocain history Beyond amiodarone, Avoid other QT prolonging drugs     4  CKD  AM labs pending        5  Pulm  CCM following  6  Polysubstance abuse Pt cocaine positive on admit though denied using   Michela Pitcher it was due to  fact she was dealing cocaine  Additional 30 min of critical care time spent with pt   For questions or updates, please contact [place] Please consult [email or web address] for contact info under        Signed, Dorris Carnes, MD  07/06/2018, 6:47 AM

## 2018-07-06 NOTE — Progress Notes (Signed)
Foley was placed, only about 10cc came out. Urine was tea colored with a lot of sediments . Bladder scan patient again new reading says 300 mL. Tried flushing the foley but still clogged up. Prior to foley placement I&O was attempted and the catheter was clogged up with each attempt.

## 2018-07-06 NOTE — Progress Notes (Signed)
eLink Physician-Brief Progress Note Patient Name: Sabrina Holland DOB: 07-Nov-1970 MRN: [id number]   Date of Service  07/06/2018  HPI/Events of Note  Anuria - Nursing unable to place Foley catheter.   eICU Interventions  Will order renal ultrasound to R/O obstruction.      Intervention Category Major Interventions: Other:  Lysle Dingwall 07/06/2018, 6:47 AM

## 2018-07-06 NOTE — Consult Note (Signed)
Advanced Heart Failure Team Consult Note   Primary Physician: Gildardo Pounds, NP PCP-Cardiologist:  Sanda Klein, MD  Reason for Consultation: Cardiogenic shock  Requesting: Dr. Harrington Challenger  HPI:    Sabrina Holland is a 48 y.o. 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 Harrington Challenger  She had a normal echocardiogram in 2013 as part of a work-up for TIA. In 06/2017  admitted with SOB and initially had left AMA. However, she returned and was found to have severe CHF with EF 25-30%, grade 3 DD. She was diuresed and started on guideline directed therapy. She has been following with Dr. Recardo Evangelist but therapy limited by her social situation and recurrent cocaine use. Last echo 11/2017 showed EF 25-30%, mild MR, mild LAE, PASP 29mHg. She was admitted in 02/2018 for recurrent HF. She was not taking Lasix as prescribed due to frequent urination at work. She refused coronary CT or cath at that time  She has subsequently followed up in telehealth with adjustment in meds. Dr. CSallyanne Kusterhas suggested avoidance of beta blocker given continued cocaine use.   She presented 6/16 to the ER with SOB and chest tightness and was found to be in atrial flutter with RVR. She was started on IV heparin and admitted to IM service. EKG from 5/4 also appears suspicious for atrial flutter as well  Labs showed microcytosis but normal Hgb, K 4.0, AKI with Cr 1.35, troponin 0.04, BNP 2450, normal TSH, Covid negative, positive cocaine/THC, negative ETOH.  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 She was started  on a diltiazem drip and given 63mIV Lasix.  Over night she developed cardiac arrest with torsades/VF. ACLS was initiated and continued for 5 minutes total. She was given mag and defibrillated x1 with ROSC. She was intubated by anesthesia and transferred to ICU. Today initial co-ox was 49%. Started on milrinone 0.25. Co-ox now 74%  Creatinine 1.3 -> -> -> 2.4  Trop 0.08  Echo 07/05/18 EF 15% RV moderately to severely down Personally reviewed  Now in NSR on amio    Awake on vent. Trying to write notes. Has no memory of events   Review of Systems: Unavailable as patient on vent  Home Medications Prior to Admission medications   Medication Sig Start Date End Date Taking? Authorizing Provider  albuterol (PROVENTIL HFA) 108 (90 Base) MCG/ACT inhaler Inhale 1-2 puffs into the lungs every 6 (six) hours as needed for up to 1 day for wheezing or shortness of breath (cough). 06/18/18 07/03/27 Yes Kilroy, Luke K, PA-C  albuterol (PROVENTIL) (2.5 MG/3ML) 0.083% nebulizer solution Take 3 mLs (2.5 mg total) by nebulization every 6 (six) hours as needed for wheezing or shortness of breath. 06/18/18  Yes Kilroy, Luke K, PA-C  amLODipine (NORVASC) 5 MG tablet Take 1 tablet (5 mg total) by mouth daily. 03/05/18 07/03/27 Yes FlGildardo PoundsNP  aspirin EC 81 MG EC tablet Take 1 tablet (81 mg total) by mouth daily. 06/24/17  Yes AbReyne DumasMD  atorvastatin (LIPITOR) 40 MG tablet Take 1 tablet (40 mg  total) by mouth daily at 6 PM. 03/05/18  Yes Gildardo Pounds, NP  budesonide-formoterol (SYMBICORT) 80-4.5 MCG/ACT inhaler Inhale 2 puffs into the lungs 2 (two) times daily. 05/08/18  Yes Gildardo Pounds, NP  cyclobenzaprine (FLEXERIL) 10 MG tablet Take 1 tablet (10 mg total) by mouth 2 (two) times daily as needed for up to 10 doses for muscle spasms. 04/02/18  Yes Curatolo, Adam, DO  diclofenac sodium (VOLTAREN) 1 % GEL Apply 2 g topically 4 (four) times daily. 04/23/18  Yes Gildardo Pounds, NP  ferrous sulfate 325 (65  FE) MG tablet Take 1 tablet (325 mg total) by mouth 2 (two) times daily with a meal. 04/18/18  Yes Smith, Rondell A, MD  fluticasone (FLONASE) 50 MCG/ACT nasal spray Place 2 sprays into both nostrils daily. 12/05/17  Yes Eugenie Filler, MD  furosemide (LASIX) 20 MG tablet Take 3 tablets (60 mg total) by mouth 2 (two) times daily for 30 days. 04/23/18 07/03/27 Yes Gildardo Pounds, NP  lidocaine (LIDODERM) 5 % Place 1 patch onto the skin daily. Remove & Discard patch within 12 hours or as directed by MD 04/29/18  Yes Jerilee Hoh, Ana P, PA-C  lisinopril (PRINIVIL,ZESTRIL) 20 MG tablet Take 1 tablet (20 mg total) by mouth daily. 03/05/18 07/03/27 Yes Gildardo Pounds, NP  metolazone (ZAROXOLYN) 2.5 MG tablet Take 1 tablet on Wednesday and Saturdays Patient taking differently: Take 2.5 mg by mouth See admin instructions. Take 1 tablet on Wednesday and Saturdays 06/18/18  Yes Kilroy, Doreene Burke, PA-C  Multiple Vitamin (MULTIVITAMIN WITH MINERALS) TABS tablet Take 1 tablet by mouth daily.   Yes [provider]  omeprazole (PRILOSEC) 20 MG capsule Take 1 capsule (20 mg total) by mouth daily. 03/05/18 07/03/27 Yes Gildardo Pounds, NP  senna-docusate (SENOKOT-S) 8.6-50 MG tablet Take 1 tablet by mouth 2 (two) times daily as needed for mild constipation. 04/18/18  Yes Norval Morton, MD  spironolactone (ALDACTONE) 25 MG tablet Take 1 tablet (25 mg total) by mouth daily. 04/23/18 07/22/18 Yes Gildardo Pounds, NP  tiotropium (SPIRIVA HANDIHALER) 18 MCG inhalation capsule Place 1 capsule (18 mcg total) into inhaler and inhale daily. 03/05/18 03/05/19 Yes Gildardo Pounds, NP    Past Medical History: Past Medical History:  Diagnosis Date   Atrial flutter ([place]) 07/03/2018   Breast cancer, stage 2 ([place]) 02/23/2011   Chronic combined systolic and diastolic CHF (congestive heart failure) ([facility])    CKD (chronic kidney disease), stage II    Cocaine use    COPD (chronic obstructive pulmonary disease) ([place])     Homelessness    Hypertension    Polysubstance abuse ([place])    Poor social situation    TIA (transient ischemic attack) 11/25/2011   "first time" (11/25/2011)    Past Surgical History: Past Surgical History:  Procedure Laterality Date   BREAST BIOPSY  2007   left   MASTECTOMY  09/2005   left   TONSILLECTOMY AND ADENOIDECTOMY     "when I was little" (11/25/2011)   TUBAL LIGATION  1993    Family History: Family History  Problem Relation Age of Onset   COPD Father    Sickle cell anemia Cousin        maternal cousin    Social History: Social History   Socioeconomic History   Marital status: Single    Spouse name: Not on file   Number of children: 4   Years of education: Not on file   Highest  education level: Not on file  Occupational History   Occupation: trying to get disability    Employer: [employer]  Social Needs   Financial resource strain: Not on file   Food insecurity    Worry: Not on file    Inability: Not on file   Transportation needs    Medical: Not on file    Non-medical: Not on file  Tobacco Use   Smoking status: Current Some Day Smoker    Packs/day: 5.00    Years: 30.00    Pack years: 150.00    Types: Cigarettes   Smokeless tobacco: Never Used   Tobacco comment: 1 or 2 cigarettes a day- approx 1 pack week  Substance and Sexual Activity   Alcohol use: Not Currently    Alcohol/week: 12.0 standard drinks    Types: 12 Cans of beer per week    Comment: last drink was 5/19   Drug use: Yes    Types: Marijuana, Cocaine    Comment: daily marijuana; last use of cocaine 05/02/18 but selling it   Sexual activity: Not Currently    Birth control/protection: Surgical  Lifestyle   Physical activity    Days per week: Not on file    Minutes per session: Not on file   Stress: Not on file  Relationships   Social connections    Talks on phone: Not on file    Gets together: Not on file    Attends religious service: Not on file     Active member of club or organization: Not on file    Attends meetings of clubs or organizations: Not on file    Relationship status: Not on file  Other Topics Concern   Not on file  Social History Narrative   ** Merged History Encounter **        Allergies:  Allergies  Allergen Reactions   Penicillins Other (See Comments)    Pt had slight throat swelling Has patient had a PCN reaction causing immediate rash, facial/tongue/throat swelling, SOB or lightheadedness with hypotension: yes Has patient had a PCN reaction causing severe rash involving mucus membranes or skin necrosis: No Has patient had a PCN reaction that required hospitalization No Has patient had a PCN reaction occurring within the last 10 years: No If all of the above answers are "NO", then may proceed with Cephalosporin use.     Objective:    Vital Signs:   Temp:  [97.7 F (36.5 C)-99.6 F (37.6 C)] 97.7 F (36.5 C) (06/19 1501) Pulse Rate:  [87-139] 96 (06/19 1615) Resp:  [14-25] 16 (06/19 1615) BP: (70-130)/(46-99) 113/99 (06/19 1615) SpO2:  [87 %-100 %] 100 % (06/19 1615) FiO2 (%):  [40 %-100 %] 70 % (06/19 1553) Weight:  [57.5 kg] 57.5 kg (06/19 0435) Last BM Date: 07/03/18  Weight change: Filed Weights   07/04/18 0446 07/05/18 0400 07/06/18 0435  Weight: 58 kg 58.5 kg 57.5 kg    Intake/Output:   Intake/Output Summary (Last 24 hours) at 07/06/2018 1641 Last data filed at 07/06/2018 1600 Gross per 24 hour  Intake 1592.97 ml  Output 530 ml  Net 1062.97 ml      Physical Exam    General:  Chronically ill appearing. Awake on vent HEENT: normal Neck: supple. JVP to ear . Carotids 2+ bilat; no bruits. No lymphadenopathy or thyromegaly appreciated. Cor: PMI nondisplaced. Regular rate & rhythm. +s3 Lungs: clear Abdomen: soft, nontender, nondistended. No hepatosplenomegaly. No bruits or masses. Good bowel sounds. Extremities: no cyanosis, clubbing,  rash, 1+ edema Neuro: awake on vent. Follows  commands   Telemetry   NSR 90s Personally reviewed   EKG    Initial AFL 136 Personally reviewed   Now NSR 90 with LVH and R-axis deviation Personally reviewed   Labs   Basic Metabolic Panel: Recent Labs  Lab 07/03/18 0501 07/04/18 0018 07/05/18 0319 07/05/18 0416 07/05/18 2358 07/06/18 0018 07/06/18 0733  NA 137 133* 130* 131* 130* 130* 130*  K 4.0 3.5 3.5 3.4* 4.7 4.7 4.4  CL 99 98 96*  --   --  97* 95*  CO2 23 22 21*  --   --  23 24  GLUCOSE 111* 130* 136*  --   --  119* 184*  BUN 26* 29* 31*  --   --  36* 41*  CREATININE 1.35* 1.34* 1.50*  --   --  1.94* 2.39*  CALCIUM 9.6 9.6 9.5  --   --  9.3 8.9  MG 1.8  --  3.1*  --   --  2.7* 2.5*  PHOS  --   --   --   --   --   --  5.3*    Liver Function Tests: Recent Labs  Lab 07/03/18 0501 07/06/18 0733  AST 31 42*  ALT 17 19  ALKPHOS 79 75  BILITOT 3.7* 2.8*  PROT 7.3 6.6  ALBUMIN 3.5 3.2*   No results for input(s): LIPASE, AMYLASE in the last 168 hours. No results for input(s): AMMONIA in the last 168 hours.  CBC: Recent Labs  Lab 07/03/18 0501 07/04/18 0918 07/05/18 0319 07/05/18 0416 07/05/18 2358  WBC 5.8 4.8 7.0  --   --   NEUTROABS 2.9 2.3  --   --   --   HGB 13.4 11.6* 12.5 15.0 16.0*  HCT 42.4 34.9* 36.8 44.0 47.0*  MCV 74.1* 71.2* 70.4*  --   --   PLT 241 241 254  --   --     Cardiac Enzymes: Recent Labs  Lab 07/04/18 0018 07/04/18 0918 07/05/18 0704 07/05/18 1007 07/05/18 1543 07/06/18 0733  CKTOTAL  --   --   --   --   --  140  CKMB  --   --   --   --   --  2.9  TROPONINI 0.06* 0.06* 0.08* 0.08* 0.08*  --     BNP: BNP (last 3 results) Recent Labs    04/16/18 0050 05/21/18 1451 07/03/18 0503  BNP 1,857.5* 1,580.9* 2,450.2*    ProBNP (last 3 results) No results for input(s): PROBNP in the last 8760 hours.   CBG: No results for input(s): GLUCAP in the last 168 hours.  Coagulation Studies: No results for input(s): LABPROT, INR in the last 72 hours.   Imaging     US Renal  Result Date: 07/06/2018 CLINICAL DATA:  Anuria. EXAM: RENAL / URINARY TRACT ULTRASOUND COMPLETE COMPARISON:  KUB 07/05/2018.  Ultrasound 02/26/2018. FINDINGS: Right Kidney: Renal measurements: 10.9 x 4.3 x 5.8 cm = volume: 141.9 mL . Echogenicity within normal limits. No mass or hydronephrosis visualized. Left Kidney: Renal measurements: 10.0 x 5.6 x 5.0 cm = volume: 145.8 mL. Echogenicity within normal limits. No mass or hydronephrosis visualized. Bladder: Nondistended. Moderate ascites incidentally noted. IMPRESSION: 1. No acute renal abnormality identified. No hydronephrosis. Bladder is nondistended. 2.  Moderate ascites incidentally noted. Electronically Signed   By: Marcello Moores  Register   On: 07/06/2018 07:54   Dg Chest Port 1 View  Result Date: 07/06/2018 CLINICAL  DATA:  Hypoxia. EXAM: PORTABLE CHEST 1 VIEW COMPARISON:  Radiograph yesterday. FINDINGS: Endotracheal tube tip at the thoracic inlet approximately 4.7 cm from the carina. Enteric tube in place with tip below the diaphragm not included in the field of view. Right upper extremity PICC in place with tip in the lower SVC. Cardiomegaly is unchanged. Left lung base opacity likely combination of pleural fluid and airspace disease, unchanged. Slight increase in right lung base atelectasis. No frank pulmonary edema. No pneumothorax. IMPRESSION: 1. Endotracheal tube tip at the thoracic inlet 4.7 cm from the carina. Enteric tube in place. Right upper extremity PICC tip in the lower SVC. 2. Unchanged left lung base opacity likely combination of pleural fluid and airspace disease. Slight increase in right basilar atelectasis. Cardiomegaly is unchanged. Electronically Signed   By: Keith Rake M.D.   On: 07/06/2018 00:47   Vas [place] Lower Extremity Venous (dvt)  Result Date: 07/06/2018  Lower Venous Study Indications: Edema.  Comparison Study: no prior Performing Technologist: Abram Sander RVS  Examination Guidelines: A complete evaluation  includes B-mode imaging, spectral Doppler, color Doppler, and power Doppler as needed of all accessible portions of each vessel. Bilateral testing is considered an integral part of a complete examination. Limited examinations for reoccurring indications may be performed as noted.  +---------+---------------+---------+-----------+----------+-------+  RIGHT     Compressibility Phasicity Spontaneity Properties Summary  +---------+---------------+---------+-----------+----------+-------+  CFV       Full            Yes       Yes                             +---------+---------------+---------+-----------+----------+-------+  SFJ       Full                                                      +---------+---------------+---------+-----------+----------+-------+  FV Prox   Full                                                      +---------+---------------+---------+-----------+----------+-------+  FV Mid    Full                                                      +---------+---------------+---------+-----------+----------+-------+  FV Distal Full                                                      +---------+---------------+---------+-----------+----------+-------+  PFV       Full                                                      +---------+---------------+---------+-----------+----------+-------+  POP       Full            Yes       Yes                             +---------+---------------+---------+-----------+----------+-------+  PTV       Full                                                      +---------+---------------+---------+-----------+----------+-------+  PERO      Full                                                      +---------+---------------+---------+-----------+----------+-------+   +---------+---------------+---------+-----------+----------+-------+  LEFT      Compressibility Phasicity Spontaneity Properties Summary  +---------+---------------+---------+-----------+----------+-------+  CFV        Full            Yes       Yes                             +---------+---------------+---------+-----------+----------+-------+  SFJ       Full                                                      +---------+---------------+---------+-----------+----------+-------+  FV Prox   Full                                                      +---------+---------------+---------+-----------+----------+-------+  FV Mid    Full                                                      +---------+---------------+---------+-----------+----------+-------+  FV Distal Full                                                      +---------+---------------+---------+-----------+----------+-------+  PFV       Full                                                      +---------+---------------+---------+-----------+----------+-------+  POP       Full            Yes       Yes                             +---------+---------------+---------+-----------+----------+-------+  PTV       Full                                                      +---------+---------------+---------+-----------+----------+-------+  PERO      Full                                                      +---------+---------------+---------+-----------+----------+-------+    Findings reported to [facility].  Summary: Right: There is no evidence of deep vein thrombosis in the lower extremity. No cystic structure found in the popliteal fossa. Left: There is no evidence of deep vein thrombosis in the lower extremity. No cystic structure found in the popliteal fossa.  *See table(s) above for measurements and observations.    Preliminary       Medications:     Current Medications:  aspirin  81 mg Per Tube Daily   atorvastatin  40 mg Oral q1800   budesonide (PULMICORT) nebulizer solution  0.5 mg Nebulization BID   chlorhexidine gluconate (MEDLINE KIT)  15 mL Mouth Rinse BID   Chlorhexidine Gluconate Cloth  6 each Topical Daily   [START ON 07/07/2018] enoxaparin  (LOVENOX) injection  60 mg Subcutaneous J00X   folic acid  1 mg Intravenous Daily   ipratropium-albuterol  3 mL Nebulization Q6H   mouth rinse  15 mL Mouth Rinse 10 times per day   multivitamin  15 mL Per Tube Daily   pantoprazole (PROTONIX) IV  40 mg Intravenous Daily   sodium chloride flush  10-40 mL Intracatheter Q12H   sodium chloride flush  3 mL Intravenous Q12H   thiamine injection  100 mg Intravenous Daily     Infusions:  sodium chloride Stopped (07/05/18 1652)   amiodarone 30 mg/hr (07/06/18 1600)   fentaNYL infusion INTRAVENOUS 150 mcg/hr (07/06/18 1600)   milrinone 0.25 mcg/kg/min (07/06/18 1600)       Assessment/Plan   1. Acute on chronic systolic HF -> cardiogenic shock - Longstanding EF 25-30%. Suspect NICM but hasn't agreed to ischemic w/u to date - Echo 07/05/18 EF 15% RV moderately to severely down Personally reviewed - Suspect due to polysubstance abuse +/- tachy-induced. But will need cath to assess definitively - Co-ox improved 49%-> 74% on milrinone. Will continue - CVP 17 Lasix 80 IV bid - Will start spiro when creatinine stabilizes - No ARB, b-blocker yet due to shick  2. Cardiac arrestVFF/TdP  - s/p Code Blue 6/19 with defib x 1 - Keep K> 4.0 Mg > 2.0  3. Acute hypoxic respiratory failure - on vent  - CCM managing  4. AKI - due to HF/cardiorenal/arrest - continue hemodynamic support  5. AFL - now back in NSR - will start heparin  6. Polysubstance abuse - longstanding. Will be major impediment to care  CRITICAL CARE Performed by: Glori Bickers  Total critical care time: 45 minutes  Critical care time was exclusive of separately billable procedures and treating other patients.  Critical care was necessary to treat or prevent imminent or life-threatening deterioration.  Critical care was time spent personally by me (independent of midlevel providers or residents) on the following activities: development of treatment plan  with  patient and/or surrogate as well as nursing, discussions with consultants, evaluation of patient's response to treatment, examination of patient, obtaining history from patient or surrogate, ordering and performing treatments and interventions, ordering and review of laboratory studies, ordering and review of radiographic studies, pulse oximetry and re-evaluation of patient's condition.  Length of Stay: 3  Glori Bickers, MD  07/06/2018, 4:41 PM  Advanced Heart Failure Team Pager [phone] (M-F; 7a - 4p)  Please contact [facility] for night-coverage after hours (4p -7a ) and weekends on [email or web address]

## 2018-07-06 NOTE — Progress Notes (Signed)
   Updated son Willis and daugther Solaris Kram - on conference call   Full code +    SIGNATURE    Dr. Brand Males, M.D., F.C.C.P,  Pulmonary and Critical Care Medicine Staff Physician, [facility] Director - Interstitial Lung Disease  Program  Pulmonary [facility] at [place], [place], [address]  Pager: [phone], If no answer or between  15:00h - 7:00h: call [phone] Telephone: [phone]  4:34 PM 07/06/2018

## 2018-07-06 NOTE — Progress Notes (Signed)
Physical Therapy Discharge Patient Details Name: Sabrina Holland MRN: [id number] DOB: 07-24-1970 Today's Date: 07/06/2018 Time:  -     Patient discharged from PT services secondary to medical decline - will need to re-order PT to resume therapy services.  Please see latest therapy progress note for current level of functioning and progress toward goals.    Progress and discharge plan discussed with patient and/or caregiver: Patient unable to participate in discharge planning and no caregivers available  GP     Denice Paradise 07/06/2018, 3:31 PM  [place] Pager:  [phone]  Office:  [phone]

## 2018-07-06 NOTE — Progress Notes (Signed)
Spoke with patients son [facility]) on the phone.  He was verbally aggressive/cursing/name calling with me.  I asked him to please stop yelling and cursing at me and he requested to speak with my "boss".  Call escalated to my AD Blinda Leatherwood.

## 2018-07-06 NOTE — Progress Notes (Signed)
[place] for enoxaparin to heparin Indication: atrial fibrillation  Allergies  Allergen Reactions  . Penicillins Other (See Comments)    Pt had slight throat swelling Has patient had a PCN reaction causing immediate rash, facial/tongue/throat swelling, SOB or lightheadedness with hypotension: yes Has patient had a PCN reaction causing severe rash involving mucus membranes or skin necrosis: No Has patient had a PCN reaction that required hospitalization No Has patient had a PCN reaction occurring within the last 10 years: No If all of the above answers are "NO", then may proceed with Cephalosporin use.     Patient Measurements: Height: 5\' 5"  (165.1 cm) Weight: 126 lb 12.2 oz (57.5 kg) IBW/kg (Calculated) : 57  Vital Signs: Temp: 97.7 F (36.5 C) (06/19 1501) Temp Source: Axillary (06/19 1501) BP: 113/99 (06/19 1615) Pulse Rate: 96 (06/19 1615)  Labs: Recent Labs    07/04/18 5053 07/05/18 0319 07/05/18 0416 07/05/18 0704 07/05/18 1007 07/05/18 1543 07/05/18 2358 07/06/18 0018 07/06/18 0733  HGB 11.6* 12.5 15.0  --   --   --  16.0*  --   --   HCT 34.9* 36.8 44.0  --   --   --  47.0*  --   --   PLT 241 254  --   --   --   --   --   --   --   CREATININE  --  1.50*  --   --   --   --   --  1.94* 2.39*  CKTOTAL  --   --   --   --   --   --   --   --  140  CKMB  --   --   --   --   --   --   --   --  2.9  TROPONINI 0.06*  --   --  0.08* 0.08* 0.08*  --   --   --     Estimated Creatinine Clearance: 25.3 mL/min (A) (by C-G formula based on SCr of 2.39 mg/dL (H)).    [name] c/o SOB x2d now associated w/ chest tightness, found to be in Afib, to transition to Lovenox as she was refusing lab draws. Patient is now intubated in the ICU s/p torsades / VF episode.   Transitioning from Lovenox to heparin Next dose of Lovenox due at 6 am - will begin heparin then  Goal of Therapy:  Monitor platelets by anticoagulation  protocol: Yes  Heparin level = 0.3 to 0.7   Plan:  Heparin drip at 750 units / hr beginning 6/20 at 6 am Heparin level 8 hours after heparin begins Daily heparin level and CBC daily  Thank you Anette Guarneri, PharmD  07/06/2018 5:12 PM Please check [email or web address] for unit-specific pharmacist phone numbers

## 2018-07-06 NOTE — Progress Notes (Signed)
Lower extremity venous has been completed.   Preliminary results in CV Proc.   Abram Sander 07/06/2018 11:33 AM

## 2018-07-06 NOTE — Progress Notes (Signed)
eLink Physician-Brief Progress Note Patient Name: Sabrina Holland DOB: 12-29-1970 MRN: [id number]   Date of Service  07/06/2018  HPI/Events of Note  Oliguria - Bladder scan reveals 400 mL residual. Unable to I/O cath d/t no urine flow.   eICU Interventions  Will order: 1. Place Foley Catheter.      Intervention Category Intermediate Interventions: Oliguria - evaluation and management  Sommer,Steven Eugene 07/06/2018, 4:38 AM

## 2018-07-06 NOTE — Progress Notes (Signed)
PT Cancellation Note  Patient Details Name: Briannah Lona MRN: [id number] DOB: Jun 09, 1970   Cancelled Treatment:    Reason Eval/Treat Not Completed: Medical issues which prohibited therapy;Patient not medically ready(Sign off per nurse.  MD please reorder if pt becomes appropriate for PT. )   Denice Paradise 07/06/2018, 3:30 PM [place] Pager:  [phone]  Office:  [phone]

## 2018-07-06 NOTE — Progress Notes (Signed)
NAME:  Sabrina Holland, MRN:  [id number], DOB:  [birth year], LOS: 3 ADMISSION DATE:  07/03/2018, CONSULTATION DATE:  6/18 REFERRING MD:  Dr. Karleen Hampshire, CHIEF COMPLAINT:  VF arrest   Brief History    48 year old female with PMH as below, which is significant for breast CA (s/p mastectomy and chemo), HFrEF, COPD, and polysubstance abuse. She was admitted to [facility] on 6/16 after presenting to the ED with complaints of SOB x 3 days as well as chest and rib pain related to work of breathing. Also complaining of lower extremity edema. She was found to be in AF with RVR. This responded well to diltiazem infusion and she was admitted to tho hospitalists team on heparin infusion. She was seen in consultation by cardiology who have been diuresing her and trying to find the best treatment options for her AF given her substance abuse and medical non-compliance.   In the early AM hours she was reportedly acting normal and suddenly became unresponsive and was noted to be in torsades/VF. ACLS was initiated and continued for 5 minutes total. She was given mag and defibrillated x1 with ROSC. She was intubated by anesthesia and transferred to ICU.   Past Medical History   has a past medical history of Atrial flutter ([place]) (07/03/2018), Breast cancer, stage 2 ([place]) (02/23/2011), Chronic combined systolic and diastolic CHF (congestive heart failure) ([place]), CKD (chronic kidney disease), stage II, Cocaine use, COPD (chronic obstructive pulmonary disease) ([place]), Homelessness, Hypertension, Polysubstance abuse ([place]), Poor social situation, and TIA (transient ischemic attack) (11/25/2011).  Significant Hospital Events   6/16 admit 6/18 Arrest, intubated, tx to ICU. retaining urine per RN.  Escalated to 50% fio2. Per RN cards wants her intubated one more day. Cards unclear what precipitated torsade and starting beta blocker and dc flexiril. Needs to avoid cocaine.  Cards   Consults:  Cardiology  Procedures:   6/18 ETT 6/18 PICC line  Significant Diagnostic Tests:    Micro Data:    Antimicrobials:     Interim history/subjective:    07/06/2018 - Cards overnight notee, "Prior ECG's reviewed. Patient does have history of mildly prolonged QT interval and had TdP arrest earlier today. Not on any other QT prolonging medications. Will start low dose amiodarone infusion without bolus to attempt to control multiple arrhythmias. Options at this time very limited and patient remains critically ill. Do not think increasing IV beta blocker or CCB is a good idea right now given severe systolic heart failure."  O2 needs up. Foley placed due to intermittent retention. Concern that urine being clogged up. RN says bladdder scan picking up ascites and patient is actually anuric AKI - worse; 1.94. EF 15% and worse. On fent gtt and amio gtt. Cards stopped TF due to worsening hypoxemia. Cards checking coox and Rx lasix  Objective   Blood pressure 102/69, pulse (!) 121, temperature 98.3 F (36.8 C), temperature source Axillary, resp. rate 18, height 5\' 5"  (1.651 m), weight 57.5 kg, last menstrual period 02/18/2015, SpO2 96 %.    Vent Mode: PRVC FiO2 (%):  [40 %-100 %] 90 % Set Rate:  [16 bmp] 16 bmp Vt Set:  [450 mL] 450 mL PEEP:  [5 cmH20-8 cmH20] 8 cmH20 Plateau Pressure:  [15 cmH20-26 cmH20] 26 cmH20   Intake/Output Summary (Last 24 hours) at 07/06/2018 0813 Last data filed at 07/06/2018 0700 Gross per 24 hour  Intake 2379.65 ml  Output 550 ml  Net 1829.65 ml   [employer]  07/04/18 0446 07/05/18 0400 07/06/18 0435  Weight: 58 kg 58.5 kg 57.5 kg    General Appearance:  Looks criticall ill. EMACIATED Head:  Normocephalic, without obvious abnormality, atraumatic Eyes:  PERRL - yes, conjunctiva/corneas - muddy     Ears:  Normal external ear canals, both ears Nose:  G tube - no Throat:  ETT TUBE - yes , OG tube - yes Neck:  Supple,  No enlargement/tenderness/nodules Lungs: Clear to auscultation  bilaterally, Ventilator   Synchrony - yes Heart:  S1 and S2 normal, no murmur, CVP - 30.  Pressors - no  Abdomen:  Soft, no masses, no organomegaly Genitalia / Rectal:  Not done Extremities:  Extremities- intact Skin:  ntact in exposed areas . Sacral area - not examined Neurologic:  Sedation - fent gtt -> RASS - -2 . Moves all 4s - yes. CAM-ICU - x . Orientation - x      LABS    PULMONARY Recent Labs  Lab 07/05/18 0416 07/05/18 2358  PHART 7.379 7.333*  PCO2ART 39.8 37.9  PO2ART 410.0* 55.0*  HCO3 23.6 20.1  TCO2 25 21*  O2SAT 100.0 86.0    CBC Recent Labs  Lab 07/03/18 0501 07/04/18 0918 07/05/18 0319 07/05/18 0416 07/05/18 2358  HGB 13.4 11.6* 12.5 15.0 16.0*  HCT 42.4 34.9* 36.8 44.0 47.0*  WBC 5.8 4.8 7.0  --   --   PLT 241 241 254  --   --     COAGULATION No results for input(s): INR in the last 168 hours.  CARDIAC   Recent Labs  Lab 07/04/18 0018 07/04/18 0918 07/05/18 0704 07/05/18 1007 07/05/18 1543  TROPONINI 0.06* 0.06* 0.08* 0.08* 0.08*   No results for input(s): PROBNP in the last 168 hours.   CHEMISTRY Recent Labs  Lab 07/03/18 0501 07/04/18 0018 07/05/18 0319 07/05/18 0416 07/05/18 2358 07/06/18 0018  NA 137 133* 130* 131* 130* 130*  K 4.0 3.5 3.5 3.4* 4.7 4.7  CL 99 98 96*  --   --  97*  CO2 23 22 21*  --   --  23  GLUCOSE 111* 130* 136*  --   --  119*  BUN 26* 29* 31*  --   --  36*  CREATININE 1.35* 1.34* 1.50*  --   --  1.94*  CALCIUM 9.6 9.6 9.5  --   --  9.3  MG 1.8  --  3.1*  --   --  2.7*   Estimated Creatinine Clearance: 31.2 mL/min (A) (by C-G formula based on SCr of 1.94 mg/dL (H)).   LIVER Recent Labs  Lab 07/03/18 0501  AST 31  ALT 17  ALKPHOS 79  BILITOT 3.7*  PROT 7.3  ALBUMIN 3.5     INFECTIOUS Recent Labs  Lab 07/06/18 0018  LATICACIDVEN 2.1*     ENDOCRINE CBG (last 3)  No results for input(s): GLUCAP in the last 72 hours.       IMAGING x48h  - image(s) personally visualized  -    highlighted in bold US Renal  Result Date: 07/06/2018 CLINICAL DATA:  Anuria. EXAM: RENAL / URINARY TRACT ULTRASOUND COMPLETE COMPARISON:  KUB 07/05/2018.  Ultrasound 02/26/2018. FINDINGS: Right Kidney: Renal measurements: 10.9 x 4.3 x 5.8 cm = volume: 141.9 mL . Echogenicity within normal limits. No mass or hydronephrosis visualized. Left Kidney: Renal measurements: 10.0 x 5.6 x 5.0 cm = volume: 145.8 mL. Echogenicity within normal limits. No mass or hydronephrosis visualized. Bladder: Nondistended. Moderate ascites incidentally noted. IMPRESSION:  1. No acute renal abnormality identified. No hydronephrosis. Bladder is nondistended. 2.  Moderate ascites incidentally noted. Electronically Signed   By: Marcello Moores  Register   On: 07/06/2018 07:54   Dg Chest Port 1 View  Result Date: 07/06/2018 CLINICAL DATA:  Hypoxia. EXAM: PORTABLE CHEST 1 VIEW COMPARISON:  Radiograph yesterday. FINDINGS: Endotracheal tube tip at the thoracic inlet approximately 4.7 cm from the carina. Enteric tube in place with tip below the diaphragm not included in the field of view. Right upper extremity PICC in place with tip in the lower SVC. Cardiomegaly is unchanged. Left lung base opacity likely combination of pleural fluid and airspace disease, unchanged. Slight increase in right lung base atelectasis. No frank pulmonary edema. No pneumothorax. IMPRESSION: 1. Endotracheal tube tip at the thoracic inlet 4.7 cm from the carina. Enteric tube in place. Right upper extremity PICC tip in the lower SVC. 2. Unchanged left lung base opacity likely combination of pleural fluid and airspace disease. Slight increase in right basilar atelectasis. Cardiomegaly is unchanged. Electronically Signed   By: Keith Rake M.D.   On: 07/06/2018 00:47   Dg Chest Port 1 View  Result Date: 07/05/2018 CLINICAL DATA:  Endotracheal tube adjustment.  Placement of OG tube. EXAM: PORTABLE CHEST 1 VIEW COMPARISON:  One-view chest x-ray 07/05/2018 at 4:16 a.m.  FINDINGS: The endotracheal tube has been advanced and now terminates 5.2 cm above the carina. A new OG tube courses off the inferior border of the film. The heart is enlarged. Left pleural effusion and retrocardiac airspace opacity is stable. Minimal atelectasis at the right base is stable. Defibrillator pads are stable. IMPRESSION: 1. Endotracheal tube advanced 1 cm, now 5 cm above the carina. 2. New OG tube courses off the inferior border the film. 3. Stable cardiomegaly with retrocardiac opacities as previously described. Electronically Signed   By: San Morelle M.D.   On: 07/05/2018 05:41   Dg Chest Port 1 View  Result Date: 07/05/2018 CLINICAL DATA:  Status post intubation. EXAM: PORTABLE CHEST 1 VIEW COMPARISON:  One-view chest x-ray 07/03/2018. FINDINGS: Patient has been intubated. The endotracheal tube air terminates 6 cm above the carina, at the level clavicles. Defibrillator pads remain in place. The heart is enlarged. There is no edema or effusion. Retrocardiac opacification is stable. The lungs are otherwise clear. IMPRESSION: 1. Satisfactory positioning of the endotracheal tube, 6 cm above the carina. 2. Stable cardiomegaly without failure. Electronically Signed   By: San Morelle M.D.   On: 07/05/2018 04:52   Dg Abd Portable 1v  Result Date: 07/05/2018 CLINICAL DATA:  Abdominal distension. EXAM: PORTABLE ABDOMEN - 1 VIEW COMPARISON:  None. FINDINGS: The bowel gas pattern is normal. No radio-opaque calculi or other significant radiographic abnormality are seen. IMPRESSION: Negative exam. Electronically Signed   By: Inge Rise M.D.   On: 07/05/2018 07:28   [place] Ekg Site Rite  Result Date: 07/05/2018 If Site Rite image not attached, placement could not be confirmed due to current cardiac rhythm.     Resolved Hospital Problem list     Assessment & Plan:   ASSESSMENT / PLAN:  PULMONARY A:  Hx of  COPD Acute resp failure due to cocaine and torsade 07/05/2018    07/06/2018 -> does not meet sbt criteria due to worsening oxygenation 90%. CXR fairly clear  P:   Full vent support Duoneb Budesonide   NEUROLOGIC A:   On sedation with fent gtt  6/17 - sedation gtt with fent  P:   fent gtt  for RASS 0 o -2    VASCULAR A:   Maintains bp/hr but at risk for circulatory shock due to ef 15%  P:  Monitor   CARDIAC A: Hx of Atrial flutter with RVR - chronic systolic CHF - BU38% in Nov 2019   6/18 - Torsade with cocaine and MJ on board. EF 15%  P: Per cards Avoid qtc ddrugs amio gtt Cards considering iontrope   INFECTIOUS A:   covid neg HIV neg  P:   Check PCT Monitor off abcx  RENAL A:  Mild aKI at presentation  6/19 - likely in renal failur without Ur OP. CV 30. Cards challenging with lasix   P: Monitor with lasix ? Needs ionotrope Get renal US  ELECTROLYTES A:  Lactic acidosis - mild due to pooor cardiac prefusion P: monitor   GASTROINTESTINAL A:   NPO   6/19 - cards holding TF due to poor perfusion  P:   ppi TF hold  HEMATOLOGIC A:  At risk anema   P:  - PRBC for hgb </= 6.9gm%    - exceptions are   -  if ACS susepcted/confirmed then transfuse for hgb </= 8.0gm%,  or    -  active bleeding with hemodynamic instability, then transfuse regardless of hemoglobin value   At at all times try to transfuse 1 unit prbc as possible with exception of active hemorrhage     ENDOCRINE A:   At risk low sugare   P:   monito4  MSK/DERM x   Polysubstance abuse (history of cocaine, opiates, THC, and ETOH) - at admit agreed to rehab once cleared - Thiamine and folic acid.  - Supportive care.    Best practice:  Diet: NPO Pain/Anxiety/Delirium protocol (if indicated): Fentanyl VAP protocol (if indicated): Per protocol DVT prophylaxis: enoxaparin GI prophylaxis: PPI Glucose control:  Mobility: BR Code Status: FULL Family Communication: Brother Camara Renstrom -> updated 07/06/2018. Explained  she has MODS  And not doing well. Brother said he is main Fish farm manager. Explained to her brother that in 7 years - all the time she is MJ and Cocaine positive - and 5 of those in June 2019 - June 2020. He is reflective of this fact. He says "do your best, see what you can do, I do not think you can do anymore, if her system Is failing it is failing, it her fault that she is doing drugs". He said he will inform the patient kids, He is appreciative of the care  Disposition: ICU      ATTESTATION & SIGNATURE   The patient Sabrina Holland is critically ill with multiple organ systems failure and requires high complexity decision making for assessment and support, frequent evaluation and titration of therapies, application of advanced monitoring technologies and extensive interpretation of multiple databases.   Critical Care Time devoted to patient care services described in this note is  30  Minutes. This time reflects time of care of this signee Dr Brand Males. This critical care time does not reflect procedure time, or teaching time or supervisory time of PA/NP/Med student/Med Resident etc but could involve care discussion time     Dr. Brand Males, M.D., [facility].C.P Pulmonary and Critical Care Medicine Staff Physician [facility] Pager: [phone], If no answer or between  15:00h - 7:00h: call [phone]  07/06/2018 8:13 AM

## 2018-07-06 NOTE — Progress Notes (Signed)
Patients Brother, 2 daughters and son have all been updated via phone.  Patients children have all called in and done an elink video call as well.  All questions were answered and I have asked the family to choose a primary contact for further updates.  Family was unable to make this decision at the time and will discuss among themselves and notify me of their decision.

## 2018-07-06 NOTE — Progress Notes (Signed)
Assisted tele visit to patient with daughter.  Sammie Denner Samson, RN  

## 2018-07-06 NOTE — Progress Notes (Addendum)
[place] for enoxaparin Indication: atrial fibrillation  Allergies  Allergen Reactions  . Penicillins Other (See Comments)    Pt had slight throat swelling Has patient had a PCN reaction causing immediate rash, facial/tongue/throat swelling, SOB or lightheadedness with hypotension: yes Has patient had a PCN reaction causing severe rash involving mucus membranes or skin necrosis: No Has patient had a PCN reaction that required hospitalization No Has patient had a PCN reaction occurring within the last 10 years: No If all of the above answers are "NO", then may proceed with Cephalosporin use.     Patient Measurements: Height: 5\' 5"  (165.1 cm) Weight: 126 lb 12.2 oz (57.5 kg) IBW/kg (Calculated) : 57  Vital Signs: Temp: 98.3 F (36.8 C) (06/19 0730) Temp Source: Axillary (06/19 0730) BP: 108/74 (06/19 0830) Pulse Rate: 123 (06/19 0830)  Labs: Recent Labs    07/04/18 1610 07/05/18 0319 07/05/18 0416 07/05/18 0704 07/05/18 1007 07/05/18 1543 07/05/18 2358 07/06/18 0018 07/06/18 0733  HGB 11.6* 12.5 15.0  --   --   --  16.0*  --   --   HCT 34.9* 36.8 44.0  --   --   --  47.0*  --   --   PLT 241 254  --   --   --   --   --   --   --   CREATININE  --  1.50*  --   --   --   --   --  1.94* 2.39*  CKTOTAL  --   --   --   --   --   --   --   --  140  CKMB  --   --   --   --   --   --   --   --  2.9  TROPONINI 0.06*  --   --  0.08* 0.08* 0.08*  --   --   --     Estimated Creatinine Clearance: 25.3 mL/min (A) (by C-G formula based on SCr of 2.39 mg/dL (H)).    [name] c/o SOB x2d now associated w/ chest tightness, found to be in Afib, to transition to Lovenox as she was refusing lab draws. Patient is now intubated in the ICU s/p torsades / VF episode.   Patient's renal function has decreased and dosing of lovenox will need to be adjusted from 1mg /kg twice daily to 1mg /kg daily. No signs or symptoms of bleeding noted or  seen upon patient physical exam with Dr. Harrington Challenger. Patient received a dose of lovenox 60mg  this morning already. CBC stable, recent hgb 16 and platelets at 254.   Goal of Therapy:  Monitor platelets by anticoagulation protocol: Yes   Plan:  -Enoxaparin 60mg  SQ QD -Monitor CBC, S/Sx bleeding    Thank you for the interesting consult and for involving pharmacy in this patient's care.  Tamela Gammon, PharmD 07/06/2018 9:03 AM PGY-1 Pharmacy Resident Direct Phone: [phone] Please check [email or web address] for unit-specific pharmacist phone numbers

## 2018-07-06 NOTE — Progress Notes (Signed)
eLink Physician-Brief Progress Note Patient Name: Sabrina Holland DOB: 10-05-1970 MRN: [id number]   Date of Service  07/06/2018  HPI/Events of Note  Hypoxia - Sat decreased into the 80's. Now Sat = 96%.  eICU Interventions  Will order: 1. Increase PEEP to 8 cm H2O.  2. Portable CXR STAT.     Intervention Category Major Interventions: Hypoxemia - evaluation and management  Orlena Garmon Eugene 07/06/2018, 12:21 AM

## 2018-07-07 LAB — PHOSPHORUS: Phosphorus: 3.8 mg/dL (ref 2.5–4.6)

## 2018-07-07 LAB — CBC
HCT: 33.2 % — ABNORMAL LOW (ref 36.0–46.0)
Hemoglobin: 10.8 g/dL — ABNORMAL LOW (ref 12.0–15.0)
MCH: 23.3 pg — ABNORMAL LOW (ref 26.0–34.0)
MCHC: 32.5 g/dL (ref 30.0–36.0)
MCV: 71.7 fL — ABNORMAL LOW (ref 80.0–100.0)
Platelets: 295 10*3/uL (ref 150–400)
RBC: 4.63 MIL/uL (ref 3.87–5.11)
RDW: 25.1 % — ABNORMAL HIGH (ref 11.5–15.5)
WBC: 7.5 10*3/uL (ref 4.0–10.5)
nRBC: 0 % (ref 0.0–0.2)

## 2018-07-07 LAB — BASIC METABOLIC PANEL
Anion gap: 10 (ref 5–15)
Anion gap: 11 (ref 5–15)
BUN: 37 mg/dL — ABNORMAL HIGH (ref 6–20)
BUN: 38 mg/dL — ABNORMAL HIGH (ref 6–20)
CO2: 26 mmol/L (ref 22–32)
CO2: 26 mmol/L (ref 22–32)
Calcium: 8.8 mg/dL — ABNORMAL LOW (ref 8.9–10.3)
Calcium: 8.9 mg/dL (ref 8.9–10.3)
Chloride: 97 mmol/L — ABNORMAL LOW (ref 98–111)
Chloride: 98 mmol/L (ref 98–111)
Creatinine, Ser: 1.5 mg/dL — ABNORMAL HIGH (ref 0.44–1.00)
Creatinine, Ser: 1.56 mg/dL — ABNORMAL HIGH (ref 0.44–1.00)
GFR calc Af Amer: 44 mL/min — ABNORMAL LOW (ref >60)
GFR calc Af Amer: 47 mL/min — ABNORMAL LOW (ref >60)
GFR calc non Af Amer: 38 mL/min — ABNORMAL LOW (ref >60)
GFR calc non Af Amer: 40 mL/min — ABNORMAL LOW (ref >60)
Glucose, Bld: 106 mg/dL — ABNORMAL HIGH (ref 70–99)
Glucose, Bld: 163 mg/dL — ABNORMAL HIGH (ref 70–99)
Potassium: 3.4 mmol/L — ABNORMAL LOW (ref 3.5–5.1)
Potassium: 3.4 mmol/L — ABNORMAL LOW (ref 3.5–5.1)
Sodium: 134 mmol/L — ABNORMAL LOW (ref 135–145)
Sodium: 134 mmol/L — ABNORMAL LOW (ref 135–145)

## 2018-07-07 LAB — COOXEMETRY PANEL
Carboxyhemoglobin: 1.9 % — ABNORMAL HIGH (ref 0.5–1.5)
Methemoglobin: 0.9 % (ref 0.0–1.5)
O2 Saturation: 70.5 %
Total hemoglobin: 11.4 g/dL — ABNORMAL LOW (ref 12.0–16.0)

## 2018-07-07 LAB — EXPECTORATED SPUTUM ASSESSMENT W GRAM STAIN, RFLX TO RESP C

## 2018-07-07 LAB — HEPATIC FUNCTION PANEL
ALT: 19 U/L (ref 0–44)
AST: 45 U/L — ABNORMAL HIGH (ref 15–41)
Albumin: 3.1 g/dL — ABNORMAL LOW (ref 3.5–5.0)
Alkaline Phosphatase: 73 U/L (ref 38–126)
Bilirubin, Direct: 2 mg/dL — ABNORMAL HIGH (ref 0.0–0.2)
Indirect Bilirubin: 1.5 mg/dL — ABNORMAL HIGH (ref 0.3–0.9)
Total Bilirubin: 3.5 mg/dL — ABNORMAL HIGH (ref 0.3–1.2)
Total Protein: 6.6 g/dL (ref 6.5–8.1)

## 2018-07-07 LAB — MAGNESIUM: Magnesium: 2 mg/dL (ref 1.7–2.4)

## 2018-07-07 LAB — HEPARIN LEVEL (UNFRACTIONATED)
Heparin Unfractionated: 0.41 IU/mL (ref 0.30–0.70)
Heparin Unfractionated: 0.51 IU/mL (ref 0.30–0.70)

## 2018-07-07 MED ORDER — POTASSIUM CHLORIDE CRYS ER 20 MEQ PO TBCR
40.0000 meq | EXTENDED_RELEASE_TABLET | Freq: Two times a day (BID) | ORAL | Status: DC
  Administered 2018-07-07 – 2018-07-11 (×9): 40 meq via ORAL
  Filled 2018-07-07 (×9): qty 2

## 2018-07-07 MED ORDER — DIGOXIN 125 MCG PO TABS
0.1250 mg | ORAL_TABLET | Freq: Every day | ORAL | Status: DC
  Administered 2018-07-07 – 2018-07-12 (×6): 0.125 mg via ORAL
  Filled 2018-07-07 (×6): qty 1

## 2018-07-07 MED ORDER — AMIODARONE IV BOLUS ONLY 150 MG/100ML
150.0000 mg | Freq: Once | INTRAVENOUS | Status: AC
  Administered 2018-07-07: 150 mg via INTRAVENOUS

## 2018-07-07 MED ORDER — HYDRALAZINE HCL 20 MG/ML IJ SOLN: 10.0000 mg | INTRAMUSCULAR | Status: DC | PRN

## 2018-07-07 MED ORDER — ADULT MULTIVITAMIN W/MINERALS CH
1.0000 | ORAL_TABLET | Freq: Every day | ORAL | Status: DC
  Administered 2018-07-07 – 2018-07-12 (×6): 1 via ORAL
  Filled 2018-07-07 (×6): qty 1

## 2018-07-07 MED ORDER — POTASSIUM CHLORIDE 20 MEQ/15ML (10%) PO SOLN: 40.0000 meq | Freq: Once | ORAL | Status: DC

## 2018-07-07 MED ORDER — METOLAZONE 2.5 MG PO TABS
2.5000 mg | ORAL_TABLET | Freq: Once | ORAL | Status: AC
  Administered 2018-07-07: 2.5 mg via ORAL
  Filled 2018-07-07: qty 1

## 2018-07-07 MED ORDER — SODIUM CHLORIDE 0.9 % IV SOLN
2.0000 g | Freq: Two times a day (BID) | INTRAVENOUS | Status: AC
  Administered 2018-07-07 – 2018-07-11 (×10): 2 g via INTRAVENOUS
  Filled 2018-07-07 (×11): qty 2

## 2018-07-07 MED ORDER — CHLORHEXIDINE GLUCONATE CLOTH 2 % EX PADS
6.0000 | MEDICATED_PAD | Freq: Every day | CUTANEOUS | Status: DC
  Administered 2018-07-08 – 2018-07-11 (×2): 6 via TOPICAL

## 2018-07-07 MED ORDER — ORAL CARE MOUTH RINSE
15.0000 mL | Freq: Two times a day (BID) | OROMUCOSAL | Status: DC
  Administered 2018-07-07 – 2018-07-12 (×5): 15 mL via OROMUCOSAL

## 2018-07-07 MED ORDER — ASPIRIN 81 MG PO CHEW
81.0000 mg | CHEWABLE_TABLET | Freq: Every day | ORAL | Status: DC
  Administered 2018-07-07 – 2018-07-11 (×5): 81 mg via ORAL
  Filled 2018-07-07 (×4): qty 1

## 2018-07-07 MED ORDER — SPIRONOLACTONE 25 MG PO TABS
25.0000 mg | ORAL_TABLET | Freq: Every day | ORAL | Status: DC
  Administered 2018-07-07 – 2018-07-12 (×6): 25 mg via ORAL
  Filled 2018-07-07 (×6): qty 1

## 2018-07-07 MED ORDER — POTASSIUM CHLORIDE 20 MEQ/15ML (10%) PO SOLN
40.0000 meq | Freq: Once | ORAL | Status: AC
  Administered 2018-07-07: 40 meq
  Filled 2018-07-07: qty 30

## 2018-07-07 MED ORDER — FENTANYL CITRATE (PF) 100 MCG/2ML IJ SOLN: 25.0000 ug | INTRAMUSCULAR | Status: DC | PRN

## 2018-07-07 NOTE — Procedures (Signed)
Extubation Procedure Note  Patient Details:   Name: Sabrina Holland DOB: March 30, 1970 MRN: [id number]   Airway Documentation:    Vent end date: 07/07/18 Vent end time: 0921   Evaluation  O2 sats: stable throughout Complications: No apparent complications Patient did tolerate procedure well. Bilateral Breath Sounds: Clear, Diminished   Pt extubated per MD order. Pt had + cuff leak. Placed on 4L [place] tolerating well, no distress noted. Pt able to voice with strong cough   Ciro Backer 07/07/2018, 9:23 AM

## 2018-07-07 NOTE — Progress Notes (Signed)
Advanced Heart Failure Rounding Note   Subjective:    Extubated this am. Denies CP or SOB. Has productive cough with purulent sputum.  Diuresing well on milrinone 0.25 and IV lasix. CVP still 17-18. Co-ox 71%  In/out AFL overnight. Now back in at 140-150 bpm.    Objective:   Weight Range:  Vital Signs:   Temp:  [97.1 F (36.2 C)-99 F (37.2 C)] 98.6 F (37 C) (06/20 0737) Pulse Rate:  [85-153] 153 (06/20 0922) Resp:  [14-23] 16 (06/20 0922) BP: (82-137)/(58-101) 137/99 (06/20 0922) SpO2:  [94 %-100 %] 98 % (06/20 0922) FiO2 (%):  [40 %-90 %] 40 % (06/20 0818) Weight:  [57.7 kg] 57.7 kg (06/20 0110) Last BM Date: 07/03/18  Weight change: Filed Weights   07/05/18 0400 07/06/18 0435 07/07/18 0110  Weight: 58.5 kg 57.5 kg 57.7 kg    Intake/Output:   Intake/Output Summary (Last 24 hours) at 07/07/2018 0943 Last data filed at 07/07/2018 0800 Gross per 24 hour  Intake 846.46 ml  Output 1750 ml  Net -903.54 ml     Physical Exam: General:  Chronically ill appearing. No resp difficulty HEENT: normal x poor dentition Neck: supple. JVP to ear. Carotids 2+ bilat; no bruits. No lymphadenopathy or thryomegaly appreciated. Cor: PMI nondisplaced. Tachy regular +s3 Lungs: crackles R base Abdomen: soft, nontender, nondistended. No hepatosplenomegaly. No bruits or masses. Good bowel sounds. Extremities: no cyanosis, clubbing, rash, 1+ edema Neuro: alert & orientedx3, cranial nerves grossly intact. moves all 4 extremities w/o difficulty. Affect pleasant  Telemetry: AFL 140-150. Sinus earlier this am. Personally reviewed   Labs: Basic Metabolic Panel: Recent Labs  Lab 07/03/18 0501  07/05/18 0319  07/05/18 2358 07/06/18 0018 07/06/18 0733 07/06/18 2340 07/07/18 0254  NA 137   < > 130*   < > 130* 130* 130* 134* 134*  K 4.0   < > 3.5   < > 4.7 4.7 4.4 3.4* 3.4*  CL 99   < > 96*  --   --  97* 95* 97* 98  CO2 23   < > 21*  --   --  _0 GLUCOSE 111*   < >  136*  --   --  119* 184* 163* 106*  BUN 26*   < > 31*  --   --  36* 41* 38* 37*  CREATININE 1.35*   < > 1.50*  --   --  1.94* 2.39* 1.56* 1.50*  CALCIUM 9.6   < > 9.5  --   --  9.3 8.9 8.8* 8.9  MG 1.8  --  3.1*  --   --  2.7* 2.5*  --  2.0  PHOS  --   --   --   --   --   --  5.3*  --  3.8   < > = values in this interval not displayed.    Liver Function Tests: Recent Labs  Lab 07/03/18 0501 07/06/18 0733 07/07/18 0254  AST 31 42* 45*  ALT _1 ALKPHOS 79 75 73  BILITOT 3.7* 2.8* 3.5*  PROT 7.3 6.6 6.6  ALBUMIN 3.5 3.2* 3.1*   No results for input(s): LIPASE, AMYLASE in the last 168 hours. No results for input(s): AMMONIA in the last 168 hours.  CBC: Recent Labs  Lab 07/03/18 0501 07/04/18 0918 07/05/18 0319 07/05/18 0416 07/05/18 2358 07/07/18 0254  WBC 5.8 4.8 7.0  --   --  7.5  NEUTROABS  2.3  --   --   --   --   °HGB 13.4 11.6* 12.5 15.0 16.0* 10.8*  °HCT 42.4 34.9* 36.8 44.0 47.0* 33.2*  °MCV 74.1* 71.2* 70.4*  --   --  71.7*  °PLT 241 241 254  --   --  295  ° ° °Cardiac Enzymes: °Recent Labs  °Lab 07/04/18 °0018 07/04/18 °0918 07/05/18 °0704 07/05/18 °1007 07/05/18 °1543 07/06/18 °0733  °CKTOTAL  --   --   --   --   --  140  °CKMB  --   --   --   --   --  2.9  °TROPONINI 0.06* 0.06* 0.08* 0.08* 0.08*  --   ° ° °BNP: °BNP (last 3 results) °Recent Labs  °  04/16/18 °0050 05/21/18 °1451 07/03/18 °0503  °BNP 1,857.5* 1,580.9* 2,450.2*  ° ° °ProBNP (last 3 results) °No results for input(s): PROBNP in the last 8760 hours. ° ° ° °Other results: ° °Imaging: °Us Renal ° °Result Date: 07/06/2018 °CLINICAL DATA:  Anuria. EXAM: RENAL / URINARY TRACT ULTRASOUND COMPLETE COMPARISON:  KUB 07/05/2018.  Ultrasound 02/26/2018. FINDINGS: Right Kidney: Renal measurements: 10.9 x 4.3 x 5.8 cm = volume: 141.9 mL . Echogenicity within normal limits. No mass or hydronephrosis visualized. Left Kidney: Renal measurements: 10.0 x 5.6 x 5.0 cm = volume: 145.8 mL. Echogenicity within normal limits.  No mass or hydronephrosis visualized. Bladder: Nondistended. Moderate ascites incidentally noted. IMPRESSION: 1. No acute renal abnormality identified. No hydronephrosis. Bladder is nondistended. 2.  Moderate ascites incidentally noted. Electronically Signed   By: Thomas  Register   On: 07/06/2018 07:54  ° °Dg Chest Port 1 View ° °Result Date: 07/06/2018 °CLINICAL DATA:  Hypoxia. EXAM: PORTABLE CHEST 1 VIEW COMPARISON:  Radiograph yesterday. FINDINGS: Endotracheal tube tip at the thoracic inlet approximately 4.7 cm from the carina. Enteric tube in place with tip below the diaphragm not included in the field of view. Right upper extremity PICC in place with tip in the lower SVC. Cardiomegaly is unchanged. Left lung base opacity likely combination of pleural fluid and airspace disease, unchanged. Slight increase in right lung base atelectasis. No frank pulmonary edema. No pneumothorax. IMPRESSION: 1. Endotracheal tube tip at the thoracic inlet 4.7 cm from the carina. Enteric tube in place. Right upper extremity PICC tip in the lower SVC. 2. Unchanged left lung base opacity likely combination of pleural fluid and airspace disease. Slight increase in right basilar atelectasis. Cardiomegaly is unchanged. Electronically Signed   By: Melanie  Sanford M.D.   On: 07/06/2018 00:47  ° °Vas Us Lower Extremity Venous (dvt) ° °Result Date: 07/06/2018 ° Lower Venous Study Indications: Edema.  Comparison Study: no prior Performing Technologist: Megan Riddle RVS  Examination Guidelines: A complete evaluation includes B-mode imaging, spectral Doppler, color Doppler, and power Doppler as needed of all accessible portions of each vessel. Bilateral testing is considered an integral part of a complete examination. Limited examinations for reoccurring indications may be performed as noted.  +---------+---------------+---------+-----------+----------+-------+  RIGHT     Compressibility Phasicity Spontaneity Properties Summary   +---------+---------------+---------+-----------+----------+-------+  CFV       Full            Yes       Yes                             +---------+---------------+---------+-----------+----------+-------+  SFJ       Full                                                      +---------+---------------+---------+-----------+----------+-------+    FV Prox   Full                                                      +---------+---------------+---------+-----------+----------+-------+  FV Mid    Full                                                      +---------+---------------+---------+-----------+----------+-------+  FV Distal Full                                                      +---------+---------------+---------+-----------+----------+-------+  PFV       Full                                                      +---------+---------------+---------+-----------+----------+-------+  POP       Full            Yes       Yes                             +---------+---------------+---------+-----------+----------+-------+  PTV       Full                                                      +---------+---------------+---------+-----------+----------+-------+  PERO      Full                                                      +---------+---------------+---------+-----------+----------+-------+   +---------+---------------+---------+-----------+----------+-------+  LEFT      Compressibility Phasicity Spontaneity Properties Summary  +---------+---------------+---------+-----------+----------+-------+  CFV       Full            Yes       Yes                             +---------+---------------+---------+-----------+----------+-------+  SFJ       Full                                                      +---------+---------------+---------+-----------+----------+-------+  FV Prox   Full                                                      +---------+---------------+---------+-----------+----------+-------+    FV Mid    Full                                                       +---------+---------------+---------+-----------+----------+-------+  FV Distal Full                                                      +---------+---------------+---------+-----------+----------+-------+  PFV       Full                                                      +---------+---------------+---------+-----------+----------+-------+  POP       Full            Yes       Yes                             +---------+---------------+---------+-----------+----------+-------+  PTV       Full                                                      +---------+---------------+---------+-----------+----------+-------+  PERO      Full                                                      +---------+---------------+---------+-----------+----------+-------+    Findings reported to [facility].  Summary: Right: There is no evidence of deep vein thrombosis in the lower extremity. No cystic structure found in the popliteal fossa. Left: There is no evidence of deep vein thrombosis in the lower extremity. No cystic structure found in the popliteal fossa.  *See table(s) above for measurements and observations. Electronically signed by Christopher Clark MD on 07/06/2018 at 4:57:01 PM.    Final   ° °Us Ekg Site Rite ° °Result Date: 07/05/2018 °If Site Rite image not attached, placement could not be confirmed due to current cardiac rhythm. ° °Us Abdomen Limited Ruq ° °Result Date: 07/06/2018 °CLINICAL DATA:  Ascites.  Increased bilirubin. EXAM: ULTRASOUND ABDOMEN LIMITED RIGHT UPPER QUADRANT COMPARISON:  None. FINDINGS: Gallbladder: No gallstones. Mild gallbladder wall thickening measuring 4.3 mm. No sonographic Murphy sign noted by sonographer. Common bile duct: Diameter: 5.7 mm Liver: No focal lesion identified. Within normal limits in parenchymal echogenicity. Portal vein is patent on color Doppler imaging with normal direction of blood flow towards the liver. Small right pleural  effusion noted. IMPRESSION: No evidence of cholelithiasis. Mild gallbladder wall thickening which may be due to incomplete distension. Alternatively it may represent an acalculous cholecystitis. Small right pleural effusion. Electronically Signed   By: Dobrinka  Dimitrova M.D.   On: 07/06/2018 21:47  °·  ° ° °Medications:   ° ° °  Scheduled Medications:  aspirin  81 mg Per Tube Daily   atorvastatin  40 mg Oral q1800   budesonide (PULMICORT) nebulizer solution  0.5 mg Nebulization BID   chlorhexidine gluconate (MEDLINE KIT)  15 mL Mouth Rinse BID   Chlorhexidine Gluconate Cloth  6 each Topical Daily   folic acid  1 mg Intravenous Daily   furosemide  80 mg Intravenous BID   ipratropium-albuterol  3 mL Nebulization Q6H   mouth rinse  15 mL Mouth Rinse 10 times per day   multivitamin  15 mL Per Tube Daily   pantoprazole (PROTONIX) IV  40 mg Intravenous Daily   potassium chloride  40 mEq Per Tube Once   sodium chloride flush  10-40 mL Intracatheter Q12H   sodium chloride flush  3 mL Intravenous Q12H   thiamine injection  100 mg Intravenous Daily     Infusions:  sodium chloride Stopped (07/05/18 1652)   amiodarone 30 mg/hr (07/07/18 0922)   amiodarone 150 mg (07/07/18 0934)   heparin 750 Units/hr (07/07/18 0800)   milrinone 0.25 mcg/kg/min (07/07/18 0800)     PRN Medications:  sodium chloride, acetaminophen, albuterol, fentaNYL (SUBLIMAZE) injection, ondansetron (ZOFRAN) IV, senna-docusate, sodium chloride flush, sodium chloride flush   Assessment/Plan:    1. Acute on chronic systolic HF -> cardiogenic shock - Longstanding EF 25-30%. Suspect NICM but hasn't agreed to ischemic w/u to date - Echo 07/05/18 EF 15% RV moderately to severely down Personally reviewed - Suspect due to polysubstance abuse +/- tachy-induced. But will need cath to assess definitively - Co-ox improved 49%-> 70% on milrinone 0.25. Will decrease to 0.125 with AFL - CVP remains 17-18 Continue  lasix 80 IV bid. Give metolazone 2.5 x 1 - Will start spiro - No ARB, b-blocker yet due to shock  2. Cardiac arrestVFF/TdP  - s/p Code Blue 6/19 with defib x 1 - Keep K> 4.0 Mg > 2.0  3. Acute hypoxic respiratory failure - on vent  - CCM managing  4. AKI - due to HF/cardiorenal/arrest - continue hemodynamic support - creatinine improving 2.5 -> 1.5  5. AFL - now back in AFL - rebolus with amio. Continue IV amio - Continue heparin  6. Polysubstance abuse - longstanding. Will be major impediment to care  7. Hypokalemia - supp. Start spiro  8. Acute bronchitis with purulent sputum - abx per CCM   CRITICAL CARE Performed by: Glori Bickers  Total critical care time: 35 minutes  Critical care time was exclusive of separately billable procedures and treating other patients.  Critical care was necessary to treat or prevent imminent or life-threatening deterioration.  Critical care was time spent personally by me (independent of midlevel providers or residents) on the following activities: development of treatment plan with patient and/or surrogate as well as nursing, discussions with consultants, evaluation of patient's response to treatment, examination of patient, obtaining history from patient or surrogate, ordering and performing treatments and interventions, ordering and review of laboratory studies, ordering and review of radiographic studies, pulse oximetry and re-evaluation of patient's condition.    Length of Stay: 4   Glori Bickers MD 07/07/2018, 9:43 AM  Advanced Heart Failure Team Pager [phone] (M-F; 7a - 4p)  Please contact [facility] for night-coverage after hours (4p -7a ) and weekends on [email or web address]

## 2018-07-07 NOTE — Progress Notes (Signed)
[place] for  heparin Indication: atrial fibrillation  Allergies  Allergen Reactions  . Penicillins Other (See Comments)    Pt had slight throat swelling Has patient had a PCN reaction causing immediate rash, facial/tongue/throat swelling, SOB or lightheadedness with hypotension: yes Has patient had a PCN reaction causing severe rash involving mucus membranes or skin necrosis: No Has patient had a PCN reaction that required hospitalization No Has patient had a PCN reaction occurring within the last 10 years: No If all of the above answers are "NO", then may proceed with Cephalosporin use.     Patient Measurements: Height: 5\' 5"  (165.1 cm) Weight: 127 lb 3.3 oz (57.7 kg) IBW/kg (Calculated) : 57  Vital Signs: Temp: 98 F (36.7 C) (06/20 1126) Temp Source: Oral (06/20 1126) BP: 131/94 (06/20 1400) Pulse Rate: 135 (06/20 1400)  Labs: Recent Labs    07/05/18 0319 07/05/18 0416 07/05/18 0704 07/05/18 1007 07/05/18 1543 07/05/18 2358  07/06/18 0733 07/06/18 2340 07/07/18 0254 07/07/18 1432  HGB 12.5 15.0  --   --   --  16.0*  --   --   --  10.8*  --   HCT 36.8 44.0  --   --   --  47.0*  --   --   --  33.2*  --   PLT 254  --   --   --   --   --   --   --   --  295  --   HEPARINUNFRC  --   --   --   --   --   --   --   --   --   --  0.41  CREATININE 1.50*  --   --   --   --   --    < > 2.39* 1.56* 1.50*  --   CKTOTAL  --   --   --   --   --   --   --  140  --   --   --   CKMB  --   --   --   --   --   --   --  2.9  --   --   --   TROPONINI  --   --  0.08* 0.08* 0.08*  --   --   --   --   --   --    < > = values in this interval not displayed.    Estimated Creatinine Clearance: 40.4 mL/min (A) (by C-G formula based on SCr of 1.5 mg/dL (H)).    [name] c/o SOB x2d now associated w/ chest tightness, found to be in Afib. Patient is now intubated in the ICU s/p torsades / VF episode. Pharmacy dosing heparin (patient was  on treatment dose lovenox; last dose 6/19 at 6am) -heparin level= 0.41   Goal of Therapy:  Monitor platelets by anticoagulation protocol: Yes  Heparin level = 0.3 to 0.7   Plan:  Continue Heparin drip at 750 units / hr Confirm heparin level later today Daily heparin level and CBC daily  Hildred Laser, PharmD Clinical Pharmacist **Pharmacist phone directory can now be found on [email or web address] ([email or web address]).  Listed under [facility].

## 2018-07-07 NOTE — Progress Notes (Signed)
Per patient request, Do not give medical information/updates to persons calling for updates with previous password "[email or web address]". Callers must have new password.   Sabrina Holland

## 2018-07-07 NOTE — Progress Notes (Signed)
Pharmacy Antibiotic Note  Sabrina Holland is a 48 y.o. female admitted on 07/03/2018 with concerns for PNA. Green sputum noted after extubation.  Pharmacy has been consulted for cefepime dosing. Tmax 99, WBC 7.5, LA 1.0. Scr 1.5.  Plan: Cefepime 2g IV q12h Monitor clinical status, renal function, length of therapy   Height: 5\' 5"  (165.1 cm) Weight: 127 lb 3.3 oz (57.7 kg) IBW/kg (Calculated) : 57  Temp (24hrs), Avg:97.9 F (36.6 C), Min:97.1 F (36.2 C), Max:99 F (37.2 C)  Recent Labs  Lab 07/03/18 0501  07/04/18 0918 07/05/18 0319 07/06/18 0018 07/06/18 0733 07/06/18 0828 07/06/18 2340 07/07/18 0254  WBC 5.8  --  4.8 7.0  --   --   --   --  7.5  CREATININE 1.35*   < >  --  1.50* 1.94* 2.39*  --  1.56* 1.50*  LATICACIDVEN  --   --   --   --  2.1*  --  1.0  --   --    < > = values in this interval not displayed.    Estimated Creatinine Clearance: 40.4 mL/min (A) (by C-G formula based on SCr of 1.5 mg/dL (H)).    Allergies  Allergen Reactions  . Penicillins Other (See Comments)    Pt had slight throat swelling Has patient had a PCN reaction causing immediate rash, facial/tongue/throat swelling, SOB or lightheadedness with hypotension: yes Has patient had a PCN reaction causing severe rash involving mucus membranes or skin necrosis: No Has patient had a PCN reaction that required hospitalization No Has patient had a PCN reaction occurring within the last 10 years: No If all of the above answers are "NO", then may proceed with Cephalosporin use.     Antimicrobials this admission: Cefepime 6/20 >>  Dose adjustments this admission:   Microbiology results: 6/20 sputum:    Thank you for allowing pharmacy to be a part of this patient's care.  Claiborne Billings, PharmD PGY2 Cardiology Pharmacy Resident Please check AMION for all Pharmacist numbers by unit 07/07/2018 10:26 AM

## 2018-07-07 NOTE — Progress Notes (Signed)
[place] for Heparin Indication: atrial fibrillation  Allergies  Allergen Reactions  . Penicillins Other (See Comments)    Pt had slight throat swelling Has patient had a PCN reaction causing immediate rash, facial/tongue/throat swelling, SOB or lightheadedness with hypotension: yes Has patient had a PCN reaction causing severe rash involving mucus membranes or skin necrosis: No Has patient had a PCN reaction that required hospitalization No Has patient had a PCN reaction occurring within the last 10 years: No If all of the above answers are "NO", then may proceed with Cephalosporin use.     Patient Measurements: Height: 5\' 5"  (165.1 cm) Weight: 127 lb 3.3 oz (57.7 kg) IBW/kg (Calculated) : 57  Vital Signs: Temp: 98 F (36.7 C) (06/20 2000) Temp Source: Oral (06/20 2000) BP: 144/90 (06/20 2100) Pulse Rate: 134 (06/20 2100)  Labs: Recent Labs    07/05/18 0319 07/05/18 0416 07/05/18 0704 07/05/18 1007 07/05/18 1543 07/05/18 2358  07/06/18 0733 07/06/18 2340 07/07/18 0254 07/07/18 1432 07/07/18 2120  HGB 12.5 15.0  --   --   --  16.0*  --   --   --  10.8*  --   --   HCT 36.8 44.0  --   --   --  47.0*  --   --   --  33.2*  --   --   PLT 254  --   --   --   --   --   --   --   --  295  --   --   HEPARINUNFRC  --   --   --   --   --   --   --   --   --   --  0.41 0.51  CREATININE 1.50*  --   --   --   --   --    < > 2.39* 1.56* 1.50*  --   --   CKTOTAL  --   --   --   --   --   --   --  140  --   --   --   --   CKMB  --   --   --   --   --   --   --  2.9  --   --   --   --   TROPONINI  --   --  0.08* 0.08* 0.08*  --   --   --   --   --   --   --    < > = values in this interval not displayed.    Estimated Creatinine Clearance: 40.4 mL/min (A) (by C-G formula based on SCr of 1.5 mg/dL (H)).    [name] c/o SOB x2d now associated w/ chest tightness, found to be in Afib. Patient is now intubated in the ICU s/p  torsades / VF episode. Pharmacy dosing heparin (patient was on treatment dose lovenox; last dose 6/19 at 6am) -heparin level= 0.41  6/20 PM update: heparin level therapeutic x 2   Goal of Therapy:  Monitor platelets by anticoagulation protocol: Yes  Heparin level = 0.3 to 0.7 units/mL   Plan:  Continue Heparin drip at 750 units / hr Daily heparin level and CBC   Narda Bonds, PharmD, BCPS Clinical Pharmacist Phone: [phone]

## 2018-07-07 NOTE — Progress Notes (Signed)
Assisted tele visit to patient with family member.  Gwenn Teodoro Ann, RN  

## 2018-07-07 NOTE — Progress Notes (Signed)
NAME:  Sabrina Holland, MRN:  [id number], DOB:  Aug 24, 1970, LOS: 4 ADMISSION DATE:  07/03/2018, CONSULTATION DATE:  6/18 REFERRING MD:  Dr. Karleen Hampshire, CHIEF COMPLAINT:  VF arrest   Brief History    48 year old female with PMH as below, which is significant for breast CA (s/p mastectomy and chemo), HFrEF, COPD, and polysubstance abuse. She was admitted to [facility] on 6/16 after presenting to the ED with complaints of SOB x 3 days as well as chest and rib pain related to work of breathing. Also complaining of lower extremity edema. She was found to be in AF with RVR. This responded well to diltiazem infusion and she was admitted to tho hospitalists team on heparin infusion. She was seen in consultation by cardiology who have been diuresing her and trying to find the best treatment options for her AF given her substance abuse and medical non-compliance.   In the early AM hours she was reportedly acting normal and suddenly became unresponsive and was noted to be in torsades/VF. ACLS was initiated and continued for 5 minutes total. She was given mag and defibrillated x1 with ROSC. She was intubated by anesthesia and transferred to ICU.   Past Medical History   has a past medical history of Atrial flutter ([place]) (07/03/2018), Breast cancer, stage 2 ([place]) (02/23/2011), Chronic combined systolic and diastolic CHF (congestive heart failure) ([place]), CKD (chronic kidney disease), stage II, Cocaine use, COPD (chronic obstructive pulmonary disease) ([place]), Homelessness, Hypertension, Polysubstance abuse ([place]), Poor social situation, and TIA (transient ischemic attack) (11/25/2011).  Significant Hospital Events   6/16 admit 6/18 Arrest, intubated, tx to ICU. retaining urine per RN.  Escalated to 50% fio2. Per RN cards wants her intubated one more day. Cards unclear what precipitated torsade and starting beta blocker and dc flexiril. Needs to avoid cocaine.  Cards   Cards overnight notee, "Prior ECG's  reviewed. Patient does have history of mildly prolonged QT interval and had TdP arrest earlier today. Not on any other QT prolonging medications. Will start low dose amiodarone infusion without bolus to attempt to control multiple arrhythmias. Options at this time very limited and patient remains critically ill. Do not think increasing IV beta blocker or CCB is a good idea right now given severe systolic heart failure."  O2 needs up. Foley placed due to intermittent retention. Concern that urine being clogged up. RN says bladdder scan picking up ascites and patient is actually anuric AKI - worse; 1.94. EF 15% and worse. On fent gtt and amio gtt. Cards stopped TF due to worsening hypoxemia. Cards checking coox and Rx lasix  Consults:  Cardiology  Procedures:  6/18 ETT 6/18 PICC line  Significant Diagnostic Tests:    Micro Data:    Antimicrobials:     Interim history/subjective:    07/07/2018 - family aware that she takes cocaine and MJ. Son is main contact. Not brother. Much better after starting milironne yesterday. Ur op picked up significantly. Currently on fent gtt, milrinone gtt, amio gtt and heparin gtt. Awake and asking for extubation  Objective   Blood pressure 112/84, pulse (!) 109, temperature 98.6 F (37 C), temperature source Oral, resp. rate 16, height 5\' 5"  (1.651 m), weight 57.7 kg, last menstrual period 02/18/2015, SpO2 95 %. CVP:  [16 mmHg-36 mmHg] 16 mmHg  Vent Mode: PSV;CPAP FiO2 (%):  [40 %-90 %] 40 % Set Rate:  [16 bmp] 16 bmp Vt Set:  [450 mL] 450 mL PEEP:  [5 cmH20-8 cmH20]  5 cmH20 Pressure Support:  [5 cmH20] 5 cmH20 Plateau Pressure:  [16 cmH20-31 cmH20] 24 cmH20   Intake/Output Summary (Last 24 hours) at 07/07/2018 0831 Last data filed at 07/07/2018 0700 Gross per 24 hour  Intake 834.44 ml  Output 1750 ml  Net -915.56 ml   Filed Weights   07/05/18 0400 07/06/18 0435 07/07/18 0110  Weight: 58.5 kg 57.5 kg 57.7 kg    General Appearance:  Looks  criticall ill. EMACITATED Head:  Normocephalic, without obvious abnormality, atraumatic Eyes:  PERRL - yes, conjunctiva/corneas - muddy     Ears:  Normal external ear canals, both ears Nose:  G tube - no Throat:  ETT TUBE - yes , OG tube - yes Neck:  Supple,  No enlargement/tenderness/nodules Lungs: Clear to auscultation bilaterally, Ventilator   Synchrony - yes, 40% Heart:  S1 and S2 normal, no murmur, CVP - x.  Pressors - ionotrope + Abdomen:  Soft, no masses, no organomegaly Genitalia / Rectal:  Not done Extremities:  Extremities- intact Skin:  ntact in exposed areas . Sacral area - not examined Neurologic:  Sedation - fent gtt -> RASS - +1 . Moves all 4s - yes. CAM-ICU - neg . Orientation - x3+         LABS    PULMONARY Recent Labs  Lab 07/05/18 0416 07/05/18 2358 07/06/18 0840 07/06/18 1240 07/06/18 1550 07/07/18 0300  PHART 7.379 7.333*  --  7.355  --   --   PCO2ART 39.8 37.9  --  42.4  --   --   PO2ART 410.0* 55.0*  --  150*  --   --   HCO3 23.6 20.1  --  23.1  --   --   TCO2 25 21*  --   --   --   --   O2SAT 100.0 86.0 49.1 81.7   99.5 74.3 70.5    CBC Recent Labs  Lab 07/04/18 0918 07/05/18 0319 07/05/18 0416 07/05/18 2358 07/07/18 0254  HGB 11.6* 12.5 15.0 16.0* 10.8*  HCT 34.9* 36.8 44.0 47.0* 33.2*  WBC 4.8 7.0  --   --  7.5  PLT 241 254  --   --  295    COAGULATION No results for input(s): INR in the last 168 hours.  CARDIAC   Recent Labs  Lab 07/04/18 0018 07/04/18 0918 07/05/18 0704 07/05/18 1007 07/05/18 1543  TROPONINI 0.06* 0.06* 0.08* 0.08* 0.08*   No results for input(s): PROBNP in the last 168 hours.   CHEMISTRY Recent Labs  Lab 07/03/18 0501  07/05/18 0319  07/05/18 2358 07/06/18 0018 07/06/18 0733 07/06/18 2340 07/07/18 0254  NA 137   < > 130*   < > 130* 130* 130* 134* 134*  K 4.0   < > 3.5   < > 4.7 4.7 4.4 3.4* 3.4*  CL 99   < > 96*  --   --  97* 95* 97* 98  CO2 23   < > 21*  --   --  23 24 26 26   GLUCOSE  111*   < > 136*  --   --  119* 184* 163* 106*  BUN 26*   < > 31*  --   --  36* 41* 38* 37*  CREATININE 1.35*   < > 1.50*  --   --  1.94* 2.39* 1.56* 1.50*  CALCIUM 9.6   < > 9.5  --   --  9.3 8.9 8.8* 8.9  MG 1.8  --  3.1*  --   --  2.7* 2.5*  --  2.0  PHOS  --   --   --   --   --   --  5.3*  --  3.8   < > = values in this interval not displayed.   Estimated Creatinine Clearance: 40.4 mL/min (A) (by C-G formula based on SCr of 1.5 mg/dL (H)).   LIVER Recent Labs  Lab 07/03/18 0501 07/06/18 0733 07/07/18 0254  AST 31 42* 45*  ALT 17 19 19   ALKPHOS 79 75 73  BILITOT 3.7* 2.8* 3.5*  PROT 7.3 6.6 6.6  ALBUMIN 3.5 3.2* 3.1*     INFECTIOUS Recent Labs  Lab 07/06/18 0018 07/06/18 0828  LATICACIDVEN 2.1* 1.0     ENDOCRINE CBG (last 3)  No results for input(s): GLUCAP in the last 72 hours.       IMAGING x48h  - image(s) personally visualized  -   highlighted in bold US Renal  Result Date: 07/06/2018 CLINICAL DATA:  Anuria. EXAM: RENAL / URINARY TRACT ULTRASOUND COMPLETE COMPARISON:  KUB 07/05/2018.  Ultrasound 02/26/2018. FINDINGS: Right Kidney: Renal measurements: 10.9 x 4.3 x 5.8 cm = volume: 141.9 mL . Echogenicity within normal limits. No mass or hydronephrosis visualized. Left Kidney: Renal measurements: 10.0 x 5.6 x 5.0 cm = volume: 145.8 mL. Echogenicity within normal limits. No mass or hydronephrosis visualized. Bladder: Nondistended. Moderate ascites incidentally noted. IMPRESSION: 1. No acute renal abnormality identified. No hydronephrosis. Bladder is nondistended. 2.  Moderate ascites incidentally noted. Electronically Signed   By: Marcello Moores  Register   On: 07/06/2018 07:54   Dg Chest Port 1 View  Result Date: 07/06/2018 CLINICAL DATA:  Hypoxia. EXAM: PORTABLE CHEST 1 VIEW COMPARISON:  Radiograph yesterday. FINDINGS: Endotracheal tube tip at the thoracic inlet approximately 4.7 cm from the carina. Enteric tube in place with tip below the diaphragm not included in the  field of view. Right upper extremity PICC in place with tip in the lower SVC. Cardiomegaly is unchanged. Left lung base opacity likely combination of pleural fluid and airspace disease, unchanged. Slight increase in right lung base atelectasis. No frank pulmonary edema. No pneumothorax. IMPRESSION: 1. Endotracheal tube tip at the thoracic inlet 4.7 cm from the carina. Enteric tube in place. Right upper extremity PICC tip in the lower SVC. 2. Unchanged left lung base opacity likely combination of pleural fluid and airspace disease. Slight increase in right basilar atelectasis. Cardiomegaly is unchanged. Electronically Signed   By: Keith Rake M.D.   On: 07/06/2018 00:47   Vas [place] Lower Extremity Venous (dvt)  Result Date: 07/06/2018  Lower Venous Study Indications: Edema.  Comparison Study: no prior Performing Technologist: Abram Sander RVS  Examination Guidelines: A complete evaluation includes B-mode imaging, spectral Doppler, color Doppler, and power Doppler as needed of all accessible portions of each vessel. Bilateral testing is considered an integral part of a complete examination. Limited examinations for reoccurring indications may be performed as noted.  +---------+---------------+---------+-----------+----------+-------+  RIGHT     Compressibility Phasicity Spontaneity Properties Summary  +---------+---------------+---------+-----------+----------+-------+  CFV       Full            Yes       Yes                             +---------+---------------+---------+-----------+----------+-------+  SFJ       Full                                                      +---------+---------------+---------+-----------+----------+-------+  FV Prox   Full                                                      +---------+---------------+---------+-----------+----------+-------+  FV Mid    Full                                                      +---------+---------------+---------+-----------+----------+-------+   FV Distal Full                                                      +---------+---------------+---------+-----------+----------+-------+  PFV       Full                                                      +---------+---------------+---------+-----------+----------+-------+  POP       Full            Yes       Yes                             +---------+---------------+---------+-----------+----------+-------+  PTV       Full                                                      +---------+---------------+---------+-----------+----------+-------+  PERO      Full                                                      +---------+---------------+---------+-----------+----------+-------+   +---------+---------------+---------+-----------+----------+-------+  LEFT      Compressibility Phasicity Spontaneity Properties Summary  +---------+---------------+---------+-----------+----------+-------+  CFV       Full            Yes       Yes                             +---------+---------------+---------+-----------+----------+-------+  SFJ       Full                                                      +---------+---------------+---------+-----------+----------+-------+  FV Prox   Full                                                      +---------+---------------+---------+-----------+----------+-------+  FV Mid    Full                                                      +---------+---------------+---------+-----------+----------+-------+  FV Distal Full                                                      +---------+---------------+---------+-----------+----------+-------+  PFV       Full                                                      +---------+---------------+---------+-----------+----------+-------+  POP       Full            Yes       Yes                             +---------+---------------+---------+-----------+----------+-------+  PTV       Full                                                       +---------+---------------+---------+-----------+----------+-------+  PERO      Full                                                      +---------+---------------+---------+-----------+----------+-------+    Findings reported to [facility].  Summary: Right: There is no evidence of deep vein thrombosis in the lower extremity. No cystic structure found in the popliteal fossa. Left: There is no evidence of deep vein thrombosis in the lower extremity. No cystic structure found in the popliteal fossa.  *See table(s) above for measurements and observations. Electronically signed by Monica Martinez MD on 07/06/2018 at 4:57:01 PM.    Final    [place] Ekg Site Rite  Result Date: 07/05/2018 If Site Rite image not attached, placement could not be confirmed due to current cardiac rhythm.  US Abdomen Limited Ruq  Result Date: 07/06/2018 CLINICAL DATA:  Ascites.  Increased bilirubin. EXAM: ULTRASOUND ABDOMEN LIMITED RIGHT UPPER QUADRANT COMPARISON:  None. FINDINGS: Gallbladder: No gallstones. Mild gallbladder wall thickening measuring 4.3 mm. No sonographic Murphy sign noted by sonographer. Common bile duct: Diameter: 5.7 mm Liver: No focal lesion identified. Within normal limits in parenchymal echogenicity. Portal vein is patent on color Doppler imaging with normal direction of blood flow towards the liver. Small right pleural effusion noted. IMPRESSION: No evidence of cholelithiasis. Mild gallbladder wall thickening which may be due to incomplete distension. Alternatively it may represent an acalculous cholecystitis. Small right pleural effusion. Electronically Signed   By: Fidela Salisbury M.D.   On: 07/06/2018 21:47      Resolved Hospital Problem list  Assessment & Plan:   ASSESSMENT / PLAN:  PULMONARY A:  Hx of  COPD Acute resp failure due to cocaine and torsade 07/05/2018   07/07/2018 -> significant improvement following starting milrinone. Doing SBT. Likely can extubate  P:   PSV and if does  well extubate Duoneb Budesonide   NEUROLOGIC A:   On sedation with fent gtt  6/20 - doing well  P:   Change fent gtt to prn    VASCULAR A:   Maintains bp/hr but at risk for circulatory shock due to ef 15%  P:  Monitor   CARDIAC A: Hx of Atrial flutter with RVR - chronic systolic CHF - WJ19% in Nov 2019   6/18 - Torsade with cocaine and MJ on board. EF 15%. Cardiogenic shock 6/20 - much better  P: Per cards Avoid qtc ddrugs amio gtt Heparin gtt Milrinone gtt   INFECTIOUS A:   covid neg HIV neg No evidence of infection  P:   Monitor off abcx  RENAL A:  Mild aKI at presentation and worse with oligurai on 07/06/2018. Rena [place] normal  6/20 - huge improvement after milrinone   P: Monitor with ionotropy  ELECTROLYTES A:  Lactic acidosis - mild due to pooor cardiac prefusion P: monitor   GASTROINTESTINAL A:   NPO   6/19 - cards holding TF due to poor perfusion  P:   ppi TF hold - restart pre cards  HEMATOLOGIC A:  At risk anema   P:  - PRBC for hgb </= 6.9gm%    - exceptions are   -  if ACS susepcted/confirmed then transfuse for hgb </= 8.0gm%,  or    -  active bleeding with hemodynamic instability, then transfuse regardless of hemoglobin value   At at all times try to transfuse 1 unit prbc as possible with exception of active hemorrhage     ENDOCRINE A:   At risk low sugare   P:   monito4  MSK/DERM x   Polysubstance abuse (history of cocaine, opiates, THC, and ETOH) - at admit agreed to rehab once cleared - Thiamine and folic acid.  - Supportive care.    Best practice:  Diet: NPO Pain/Anxiety/Delirium protocol (if indicated): Fentanyl VAP protocol (if indicated): Per protocol DVT prophylaxis: enoxaparin GI prophylaxis: PPI Glucose control:  Mobility: BR Code Status: FULL Family Communication: Brother Lillie Portner -> updated 07/06/2018. Explained she has MODS  And not doing well. Brother said he is main  Fish farm manager. Explained to her brother that in 7 years - all the time she is MJ and Cocaine positive - and 5 of those in June 2019 - June 2020. He is reflective of this fact. He says "do your best, see what you can do, I do not think you can do anymore, if her system Is failing it is failing, it her fault that she is doing drugs". He said he will inform the patient kids, He is appreciative of the care   6/19 - son and daughter updated   Disposition: ICU      ATTESTATION & SIGNATURE   The patient Sabrina Holland is critically ill with multiple organ systems failure and requires high complexity decision making for assessment and support, frequent evaluation and titration of therapies, application of advanced monitoring technologies and extensive interpretation of multiple databases.   Critical Care Time devoted to patient care services described in this note is  30  Minutes. This time reflects time of care of this signee Dr Belva Crome  Brittin Janik. This critical care time does not reflect procedure time, or teaching time or supervisory time of PA/NP/Med student/Med Resident etc but could involve care discussion time     Dr. Brand Males, M.D., [facility].C.P Pulmonary and Critical Care Medicine Staff Physician [place] [facility] Pager: [phone], If no answer or between  15:00h - 7:00h: call [phone]  07/07/2018 8:31 AM

## 2018-07-07 NOTE — Progress Notes (Signed)
Pt in need of more vascular access for medication administration, however unable to obtain PIV due to left mastectomy and double lumen PICC on right arm.   Per Hayden Pedro, NP: Milrinone gtt, fentanyl gtt, and heparin gtt to be run through CVP setup on one port. CVP's to be obtained q4h. Amio gtt to be run through second port. Amio gtt should be paused and port well flushed for blood specimen collection.

## 2018-07-07 NOTE — Progress Notes (Signed)
Assisted tele visit to patient with daughter.  Amar Sippel Samson, RN  

## 2018-07-07 NOTE — Evaluation (Signed)
Clinical/Bedside Swallow Evaluation Patient Details  Name: Sabrina Holland MRN: [id number] Date of Birth: Oct 04, 1970  Today's Date: 07/07/2018 Time: SLP Start Time (ACUTE ONLY): 1410 SLP Stop Time (ACUTE ONLY): 1418 SLP Time Calculation (min) (ACUTE ONLY): 8 min  Past Medical History:  Past Medical History:  Diagnosis Date  . Atrial flutter ([employer]) 07/03/2018  . Breast cancer, stage 2 ([place]) 02/23/2011  . Chronic combined systolic and diastolic CHF (congestive heart failure) ([place])   . CKD (chronic kidney disease), stage II   . Cocaine use   . COPD (chronic obstructive pulmonary disease) ([place])   . Homelessness   . Hypertension   . Polysubstance abuse ([place])   . Poor social situation   . TIA (transient ischemic attack) 11/25/2011   "first time" (11/25/2011)   Past Surgical History:  Past Surgical History:  Procedure Laterality Date  . BREAST BIOPSY  2007   left  . MASTECTOMY  09/2005   left  . TONSILLECTOMY AND ADENOIDECTOMY     "when I was little" (11/25/2011)  . TUBAL LIGATION  19954   HPI:  48 year old female with PMH as below, which is significant for breast CA (s/p mastectomy and chemo), HFrEF, COPD, and polysubstance abuse. She was admitted to [facility] on 6/16 after presenting to the ED with complaints of SOB x 3 days as well as chest and rib pain related to work of breathing. Also complaining of lower extremity edema. She was found to be in AF with RVR. In the early AM hours she was reportedly acting normal and suddenly became unresponsive and was noted to be in torsades/VF. ACLS was initiated and continued for 5 minutes total. She was given mag and defibrillated x1 with ROSC. She was intubated by anesthesia and transferred to ICU. Intubated 6/18 to 6/20.     Assessment / Plan / Recommendation Clinical Impression  Pt demonstrated no evidence of impaired swallow function following 2 day intubation. Has been tolerating sips and chips today, passed 3 oz water swallow.  Denies history of dysphagia. Will initiate a regular diet and thin liquids and sign off.       Aspiration Risk  Mild aspiration risk    Diet Recommendation Regular;Nectar-thick liquid   Liquid Administration via: Cup;Straw Medication Administration: Whole meds with liquid Supervision: Patient able to self feed Postural Changes: Seated upright at 90 degrees    Other  Recommendations Oral Care Recommendations: Oral care BID   Follow up Recommendations None      Frequency and Duration            Prognosis        Swallow Study   General HPI: 48 year old female with PMH as below, which is significant for breast CA (s/p mastectomy and chemo), HFrEF, COPD, and polysubstance abuse. She was admitted to [facility] on 6/16 after presenting to the ED with complaints of SOB x 3 days as well as chest and rib pain related to work of breathing. Also complaining of lower extremity edema. She was found to be in AF with RVR. In the early AM hours she was reportedly acting normal and suddenly became unresponsive and was noted to be in torsades/VF. ACLS was initiated and continued for 5 minutes total. She was given mag and defibrillated x1 with ROSC. She was intubated by anesthesia and transferred to ICU. Intubated 6/18 to 6/20.   Type of Study: Bedside Swallow Evaluation Previous Swallow Assessment: none Diet Prior to this Study: NPO Temperature  Spikes Noted: No Respiratory Status: Nasal cannula History of Recent Intubation: Yes Length of Intubations (days): 2 days Date extubated: 07/07/18 Behavior/Cognition: Alert;Cooperative;Pleasant mood Oral Cavity Assessment: Within Functional Limits Oral Care Completed by SLP: No Oral Cavity - Dentition: Poor condition;Missing dentition Vision: Functional for self-feeding Self-Feeding Abilities: Able to feed self Patient Positioning: Upright in bed Baseline Vocal Quality: Hoarse Volitional Cough: Strong Volitional Swallow: Able to elicit     Oral/Motor/Sensory Function Overall Oral Motor/Sensory Function: Within functional limits   Ice Chips Ice chips: Within functional limits   Thin Liquid Thin Liquid: Within functional limits Presentation: Cup;Straw;Self Fed    Nectar Thick Nectar Thick Liquid: Not tested   Honey Thick Honey Thick Liquid: Not tested   Puree Puree: Within functional limits Presentation: Self Fed;Spoon   Solid     Solid: Within functional limits Presentation: Sabrina Sabrina Alberts, MA [place] Pager [phone] Office [phone]  Sabrina Holland, Sabrina Holland 07/07/2018,2:20 PM

## 2018-07-07 NOTE — Progress Notes (Signed)
eLink Physician-Brief Progress Note Patient Name: Sabrina Holland DOB: 06/08/1970 MRN: [id number]   Date of Service  07/07/2018  HPI/Events of Note  K+ = 3.4 and Creatinine = 1.56.   eICU Interventions  Will replace K+.       Intervention Category Major Interventions: Electrolyte abnormality - evaluation and management  Sommer,Steven Eugene 07/07/2018, 3:01 AM

## 2018-07-07 NOTE — Progress Notes (Addendum)
Transient SVT during SBT but otherwwise meets extubation criteira  Plan Extubate   TRH primary 07/08/18 and ccm off - d.w Dr Donata Duff    Dr. Brand Males, M.D., F.C.C.P,  Pulmonary and Critical Care Medicine Staff Physician, [place] Director - Interstitial Lung Disease  Program  Pulmonary [place] at [place], [place], [address]  Pager: [phone], If no answer or between  15:00h - 7:00h: call [phone] Telephone: [phone]  9:32 AM 07/07/2018

## 2018-07-07 NOTE — Progress Notes (Signed)
   Green sputum reported by cards MD noticed around time of extubation  Plan Cefepime Sputum culture    SIGNATURE    Dr. Brand Males, M.D., F.C.C.P,  Pulmonary and Critical Care Medicine Staff Physician, [place] Director - Interstitial Lung Disease  Program  Pulmonary [place] at [place], [place], [address]  Pager: [phone], If no answer or between  15:00h - 7:00h: call [phone] Telephone: [phone]  9:49 AM 07/07/2018

## 2018-07-07 NOTE — Progress Notes (Signed)
175 cc of Fentanyl wasted in sink witnessed by Luberta Mutter and [facility]. [place], Ardeth Sportsman

## 2018-07-08 LAB — CBC
HCT: 34.2 % — ABNORMAL LOW (ref 36.0–46.0)
Hemoglobin: 11.5 g/dL — ABNORMAL LOW (ref 12.0–15.0)
MCH: 23.7 pg — ABNORMAL LOW (ref 26.0–34.0)
MCHC: 33.6 g/dL (ref 30.0–36.0)
MCV: 70.4 fL — ABNORMAL LOW (ref 80.0–100.0)
Platelets: 254 10*3/uL (ref 150–400)
RBC: 4.86 MIL/uL (ref 3.87–5.11)
RDW: 25.1 % — ABNORMAL HIGH (ref 11.5–15.5)
WBC: 7.3 10*3/uL (ref 4.0–10.5)
nRBC: 0 % (ref 0.0–0.2)

## 2018-07-08 LAB — HEPARIN LEVEL (UNFRACTIONATED): Heparin Unfractionated: 0.46 IU/mL (ref 0.30–0.70)

## 2018-07-08 LAB — HEPATIC FUNCTION PANEL
ALT: 22 U/L (ref 0–44)
AST: 59 U/L — ABNORMAL HIGH (ref 15–41)
Albumin: 3.2 g/dL — ABNORMAL LOW (ref 3.5–5.0)
Alkaline Phosphatase: 77 U/L (ref 38–126)
Bilirubin, Direct: 2.2 mg/dL — ABNORMAL HIGH (ref 0.0–0.2)
Indirect Bilirubin: 1.8 mg/dL — ABNORMAL HIGH (ref 0.3–0.9)
Total Bilirubin: 4 mg/dL — ABNORMAL HIGH (ref 0.3–1.2)
Total Protein: 6.8 g/dL (ref 6.5–8.1)

## 2018-07-08 LAB — BASIC METABOLIC PANEL
Anion gap: 10 (ref 5–15)
BUN: 27 mg/dL — ABNORMAL HIGH (ref 6–20)
CO2: 28 mmol/L (ref 22–32)
Calcium: 9.4 mg/dL (ref 8.9–10.3)
Chloride: 93 mmol/L — ABNORMAL LOW (ref 98–111)
Creatinine, Ser: 1.17 mg/dL — ABNORMAL HIGH (ref 0.44–1.00)
GFR calc Af Amer: >60 mL/min (ref >60)
GFR calc non Af Amer: 54 mL/min — ABNORMAL LOW (ref >60)
Glucose, Bld: 107 mg/dL — ABNORMAL HIGH (ref 70–99)
Potassium: 4.1 mmol/L (ref 3.5–5.1)
Sodium: 131 mmol/L — ABNORMAL LOW (ref 135–145)

## 2018-07-08 LAB — COOXEMETRY PANEL
Carboxyhemoglobin: 1.4 % (ref 0.5–1.5)
Carboxyhemoglobin: 1.8 % — ABNORMAL HIGH (ref 0.5–1.5)
Methemoglobin: 0.8 % (ref 0.0–1.5)
Methemoglobin: 1 % (ref 0.0–1.5)
O2 Saturation: 38.2 %
O2 Saturation: 54.5 %
Total hemoglobin: 12 g/dL (ref 12.0–16.0)
Total hemoglobin: 12 g/dL (ref 12.0–16.0)

## 2018-07-08 LAB — PHOSPHORUS: Phosphorus: 2.2 mg/dL — ABNORMAL LOW (ref 2.5–4.6)

## 2018-07-08 LAB — PROCALCITONIN: Procalcitonin: 0.42 ng/mL

## 2018-07-08 LAB — MAGNESIUM: Magnesium: 1.9 mg/dL (ref 1.7–2.4)

## 2018-07-08 MED ORDER — FOLIC ACID 1 MG PO TABS
1.0000 mg | ORAL_TABLET | Freq: Every day | ORAL | Status: DC
  Administered 2018-07-08 – 2018-07-12 (×5): 1 mg via ORAL
  Filled 2018-07-08 (×5): qty 1

## 2018-07-08 MED ORDER — VITAMIN B-1 100 MG PO TABS
100.0000 mg | ORAL_TABLET | Freq: Every day | ORAL | Status: DC
  Administered 2018-07-09 – 2018-07-12 (×4): 100 mg via ORAL
  Filled 2018-07-08 (×4): qty 1

## 2018-07-08 MED ORDER — LOSARTAN POTASSIUM 25 MG PO TABS
25.0000 mg | ORAL_TABLET | Freq: Every day | ORAL | Status: DC
  Administered 2018-07-08 – 2018-07-09 (×2): 25 mg via ORAL
  Filled 2018-07-08 (×2): qty 1

## 2018-07-08 MED ORDER — TRAMADOL HCL 50 MG PO TABS
50.0000 mg | ORAL_TABLET | Freq: Four times a day (QID) | ORAL | Status: DC | PRN
  Administered 2018-07-08 – 2018-07-11 (×8): 50 mg via ORAL
  Filled 2018-07-08 (×8): qty 1

## 2018-07-08 MED ORDER — PANTOPRAZOLE SODIUM 40 MG PO TBEC
40.0000 mg | DELAYED_RELEASE_TABLET | Freq: Every day | ORAL | Status: DC
  Administered 2018-07-09 – 2018-07-12 (×4): 40 mg via ORAL
  Filled 2018-07-08 (×4): qty 1

## 2018-07-08 MED ORDER — IPRATROPIUM-ALBUTEROL 0.5-2.5 (3) MG/3ML IN SOLN
3.0000 mL | Freq: Two times a day (BID) | RESPIRATORY_TRACT | Status: DC
  Administered 2018-07-08 – 2018-07-12 (×9): 3 mL via RESPIRATORY_TRACT
  Filled 2018-07-08 (×9): qty 3

## 2018-07-08 MED ORDER — SORBITOL 70 % SOLN
30.0000 mL | Freq: Every day | Status: DC | PRN
  Administered 2018-07-08: 30 mL via ORAL
  Filled 2018-07-08: qty 30

## 2018-07-08 MED ORDER — FUROSEMIDE 10 MG/ML IJ SOLN
80.0000 mg | Freq: Three times a day (TID) | INTRAMUSCULAR | Status: DC
  Administered 2018-07-08 – 2018-07-10 (×6): 80 mg via INTRAVENOUS
  Filled 2018-07-08 (×6): qty 8

## 2018-07-08 NOTE — Progress Notes (Signed)
RN entered patient's room because patient's chair alarm was alarming and patient was attempting to get up without assistance.  RN asked patient to please call and wait for assistance before getting up, patient became extremely agitated and requested her bag.  Patient pulled a plastic bag out of her bag, with a folded up tissue, containing a white powder.  Patient states, "there's my dope." Then patient sprinkled substance on her food and stated it was salt.  RN noticed several medication bottles in patient's bag.  Patient becoming more agitated and RN asked Charge to speak with patient and ask patient to lock personal medication in pharmacy or have family pick up. [place], Ardeth Sportsman

## 2018-07-08 NOTE — Progress Notes (Signed)
Green for  heparin Indication: atrial fibrillation  Allergies  Allergen Reactions  . Penicillins Other (See Comments)    Pt had slight throat swelling Has patient had a PCN reaction causing immediate rash, facial/tongue/throat swelling, SOB or lightheadedness with hypotension: yes Has patient had a PCN reaction causing severe rash involving mucus membranes or skin necrosis: No Has patient had a PCN reaction that required hospitalization No Has patient had a PCN reaction occurring within the last 10 years: No If all of the above answers are "NO", then may proceed with Cephalosporin use.     Patient Measurements: Height: 5\' 5"  (165.1 cm) Weight: 127 lb 3.3 oz (57.7 kg) IBW/kg (Calculated) : 57  Vital Signs: Temp: 99.1 F (37.3 C) (06/21 0738) Temp Source: Oral (06/21 0738) BP: 109/71 (06/21 0500) Pulse Rate: 131 (06/21 0100)  Labs: Recent Labs    07/05/18 1007 07/05/18 1543  07/05/18 2358  07/06/18 0733 07/06/18 2340 07/07/18 0254 07/07/18 1432 07/07/18 2120 07/08/18 0342  HGB  --   --    < > 16.0*  --   --   --  10.8*  --   --  11.5*  HCT  --   --   --  47.0*  --   --   --  33.2*  --   --  34.2*  PLT  --   --   --   --   --   --   --  295  --   --  254  HEPARINUNFRC  --   --   --   --   --   --   --   --  0.41 0.51 0.46  CREATININE  --   --   --   --    < > 2.39* 1.56* 1.50*  --   --  1.17*  CKTOTAL  --   --   --   --   --  140  --   --   --   --   --   CKMB  --   --   --   --   --  2.9  --   --   --   --   --   TROPONINI 0.08* 0.08*  --   --   --   --   --   --   --   --   --    < > = values in this interval not displayed.    Estimated Creatinine Clearance: 51.8 mL/min (A) (by C-G formula based on SCr of 1.17 mg/dL (H)).    [name] c/o SOB x2d now associated w/ chest tightness, found to be in Afib. Patient is in the ICU s/p torsades / VF episode. Pharmacy dosing heparin.  Heparin level remains  therapeutic at 0.46 on heparin 750 units/hr. CBC stable. Some blood noted in urine and sputum.   Goal of Therapy:  Monitor platelets by anticoagulation protocol: Yes  Heparin level = 0.3 to 0.7   Plan:  Continue heparin drip at 750 units / hr Daily heparin level and CBC daily Continue to monitor signs/symptoms of bleeding   Claiborne Billings, PharmD PGY2 Cardiology Pharmacy Resident Please check AMION for all Pharmacist numbers by unit 07/08/2018 8:34 AM

## 2018-07-08 NOTE — Progress Notes (Addendum)
Advanced Heart Failure Rounding Note   Subjective:     Extubated on 6/20  Had recurrent AFL yesterday and bolused several times with amio. Remains on amio gtt at 73.   Remains on milrinone 0.125 (turned down from 0.25 yesterday) and IV lasix. Good urine output but drinking a lot. Weight up 2 pounds.   Initial co-ox this am was 38% Repeat was 54%.  CVP 17  Chest is sore due to CPR. Minimal hemoptysis   Objective:   Weight Range:  Vital Signs:   Temp:  [97.7 F (36.5 C)-99.1 F (37.3 C)] 99.1 F (37.3 C) (06/21 0738) Pulse Rate:  [98-145] 102 (06/21 0900) Resp:  [12-25] 19 (06/21 0900) BP: (109-151)/(58-111) 139/92 (06/21 0800) SpO2:  [88 %-100 %] 100 % (06/21 0900) Weight:  [58.8 kg] 58.8 kg (06/21 0800) Last BM Date: 07/03/18  Weight change: Filed Weights   07/06/18 0435 07/07/18 0110 07/08/18 0800  Weight: 57.5 kg 57.7 kg 58.8 kg    Intake/Output:   Intake/Output Summary (Last 24 hours) at 07/08/2018 1003 Last data filed at 07/08/2018 0800 Gross per 24 hour  Intake 1993.43 ml  Output 2450 ml  Net -456.57 ml     Physical Exam: General:  Sitting up in bed. No resp difficulty HEENT: normal Neck: supple. JVP to ear  Carotids 2+ bilat; no bruits. No lymphadenopathy or thryomegaly appreciated. Cor: PMI nondisplaced. Regular tachy +s 3 2/6 MR Lungs: clear Abdomen: soft, nontender, + distended. No hepatosplenomegaly. No bruits or masses. Good bowel sounds. Extremities: no cyanosis, clubbing, rash, edema warm Neuro: alert & orientedx3, cranial nerves grossly intact. moves all 4 extremities w/o difficulty. Affect pleasant   Telemetry: Sinus 100-110. Personally reviewed   Labs: Basic Metabolic Panel: Recent Labs  Lab 07/05/18 0319  07/06/18 0018 07/06/18 0733 07/06/18 2340 07/07/18 0254 07/08/18 0342  NA 130*   < > 130* 130* 134* 134* 131*  K 3.5   < > 4.7 4.4 3.4* 3.4* 4.1  CL 96*  --  97* 95* 97* 98 93*  CO2 21*  --  23 24 26 26 28   GLUCOSE 136*   --  119* 184* 163* 106* 107*  BUN 31*  --  36* 41* 38* 37* 27*  CREATININE 1.50*  --  1.94* 2.39* 1.56* 1.50* 1.17*  CALCIUM 9.5  --  9.3 8.9 8.8* 8.9 9.4  MG 3.1*  --  2.7* 2.5*  --  2.0 1.9  PHOS  --   --   --  5.3*  --  3.8 2.2*   < > = values in this interval not displayed.    Liver Function Tests: Recent Labs  Lab 07/03/18 0501 07/06/18 0733 07/07/18 0254 07/08/18 0342  AST 31 42* 45* 59*  ALT 17 19 19 22   ALKPHOS 79 75 73 77  BILITOT 3.7* 2.8* 3.5* 4.0*  PROT 7.3 6.6 6.6 6.8  ALBUMIN 3.5 3.2* 3.1* 3.2*   No results for input(s): LIPASE, AMYLASE in the last 168 hours. No results for input(s): AMMONIA in the last 168 hours.  CBC: Recent Labs  Lab 07/03/18 0501 07/04/18 0918 07/05/18 0319 07/05/18 0416 07/05/18 2358 07/07/18 0254 07/08/18 0342  WBC 5.8 4.8 7.0  --   --  7.5 7.3  NEUTROABS 2.9 2.3  --   --   --   --   --   HGB 13.4 11.6* 12.5 15.0 16.0* 10.8* 11.5*  HCT 42.4 34.9* 36.8 44.0 47.0* 33.2* 34.2*  MCV 74.1* 71.2* 70.4*  --   --  71.7* 70.4*  PLT 241 241 254  --   --  295 254    Cardiac Enzymes: Recent Labs  Lab 07/04/18 0018 07/04/18 0918 07/05/18 0704 07/05/18 1007 07/05/18 1543 07/06/18 0733  CKTOTAL  --   --   --   --   --  140  CKMB  --   --   --   --   --  2.9  TROPONINI 0.06* 0.06* 0.08* 0.08* 0.08*  --     BNP: BNP (last 3 results) Recent Labs    04/16/18 0050 05/21/18 1451 07/03/18 0503  BNP 1,857.5* 1,580.9* 2,450.2*    ProBNP (last 3 results) No results for input(s): PROBNP in the last 8760 hours.    Other results:  Imaging: Vas [place] Lower Extremity Venous (dvt)  Result Date: 07/06/2018  Lower Venous Study Indications: Edema.  Comparison Study: no prior Performing Technologist: Abram Sander RVS  Examination Guidelines: A complete evaluation includes B-mode imaging, spectral Doppler, color Doppler, and power Doppler as needed of all accessible portions of each vessel. Bilateral testing is considered an integral part of  a complete examination. Limited examinations for reoccurring indications may be performed as noted.  +---------+---------------+---------+-----------+----------+-------+ RIGHT    CompressibilityPhasicitySpontaneityPropertiesSummary +---------+---------------+---------+-----------+----------+-------+ CFV      Full           Yes      Yes                          +---------+---------------+---------+-----------+----------+-------+ SFJ      Full                                                 +---------+---------------+---------+-----------+----------+-------+ FV Prox  Full                                                 +---------+---------------+---------+-----------+----------+-------+ FV Mid   Full                                                 +---------+---------------+---------+-----------+----------+-------+ FV DistalFull                                                 +---------+---------------+---------+-----------+----------+-------+ PFV      Full                                                 +---------+---------------+---------+-----------+----------+-------+ POP      Full           Yes      Yes                          +---------+---------------+---------+-----------+----------+-------+ PTV      Full                                                 +---------+---------------+---------+-----------+----------+-------+  PERO     Full                                                 +---------+---------------+---------+-----------+----------+-------+   +---------+---------------+---------+-----------+----------+-------+ LEFT     CompressibilityPhasicitySpontaneityPropertiesSummary +---------+---------------+---------+-----------+----------+-------+ CFV      Full           Yes      Yes                          +---------+---------------+---------+-----------+----------+-------+ SFJ      Full                                                  +---------+---------------+---------+-----------+----------+-------+ FV Prox  Full                                                 +---------+---------------+---------+-----------+----------+-------+ FV Mid   Full                                                 +---------+---------------+---------+-----------+----------+-------+ FV DistalFull                                                 +---------+---------------+---------+-----------+----------+-------+ PFV      Full                                                 +---------+---------------+---------+-----------+----------+-------+ POP      Full           Yes      Yes                          +---------+---------------+---------+-----------+----------+-------+ PTV      Full                                                 +---------+---------------+---------+-----------+----------+-------+ PERO     Full                                                 +---------+---------------+---------+-----------+----------+-------+    Findings reported to [facility].  Summary: Right: There is no evidence of deep vein thrombosis in the lower extremity. No cystic structure found in the popliteal fossa. Left: There is no evidence of deep vein thrombosis in the lower extremity. No cystic structure found in the popliteal fossa.  *See  table(s) above for measurements and observations. Electronically signed by Monica Martinez MD on 07/06/2018 at 4:57:01 PM.    Final    US Abdomen Limited Ruq  Result Date: 07/06/2018 CLINICAL DATA:  Ascites.  Increased bilirubin. EXAM: ULTRASOUND ABDOMEN LIMITED RIGHT UPPER QUADRANT COMPARISON:  None. FINDINGS: Gallbladder: No gallstones. Mild gallbladder wall thickening measuring 4.3 mm. No sonographic Murphy sign noted by sonographer. Common bile duct: Diameter: 5.7 mm Liver: No focal lesion identified. Within normal limits in parenchymal echogenicity. Portal vein is patent on color  Doppler imaging with normal direction of blood flow towards the liver. Small right pleural effusion noted. IMPRESSION: No evidence of cholelithiasis. Mild gallbladder wall thickening which may be due to incomplete distension. Alternatively it may represent an acalculous cholecystitis. Small right pleural effusion. Electronically Signed   By: Fidela Salisbury M.D.   On: 07/06/2018 21:47     Medications:     Scheduled Medications: . aspirin  81 mg Oral Daily  . atorvastatin  40 mg Oral q1800  . budesonide (PULMICORT) nebulizer solution  0.5 mg Nebulization BID  . Chlorhexidine Gluconate Cloth  6 each Topical Daily  . digoxin  0.125 mg Oral Daily  . folic acid  1 mg Intravenous Daily  . furosemide  80 mg Intravenous BID  . ipratropium-albuterol  3 mL Nebulization BID  . mouth rinse  15 mL Mouth Rinse BID  . multivitamin with minerals  1 tablet Oral Daily  . pantoprazole (PROTONIX) IV  40 mg Intravenous Daily  . potassium chloride  40 mEq Oral BID  . sodium chloride flush  10-40 mL Intracatheter Q12H  . sodium chloride flush  3 mL Intravenous Q12H  . spironolactone  25 mg Oral Daily  . thiamine injection  100 mg Intravenous Daily    Infusions: . sodium chloride Stopped (07/08/18 0732)  . amiodarone Stopped (07/08/18 0756)  . ceFEPime (MAXIPIME) IV Stopped (07/07/18 2147)  . heparin 750 Units/hr (07/08/18 0800)  . milrinone 0.125 mcg/kg/min (07/08/18 0800)    PRN Medications: sodium chloride, acetaminophen, albuterol, fentaNYL (SUBLIMAZE) injection, hydrALAZINE, ondansetron (ZOFRAN) IV, senna-docusate, sodium chloride flush, sodium chloride flush   Assessment/Plan:    1. Acute on chronic systolic HF -> cardiogenic shock - Longstanding EF 25-30%. Suspect NICM but hasn't agreed to ischemic w/u to date - Echo 07/05/18 EF 15% RV moderately to severely down Personally reviewed - Suspect due to polysubstance abuse +/- tachy-induced. - Co-ox variable this am. But appears warm and  is diuresing. Will continue milrinone at 0.125 for now - CVP remains 17. Increase lasix to 80 IV tid. Fluid restritt to 1800cc - Continue sprio and dig - Add losartan 25 - No b-blocker yet due to shock - Will need R/L cath this week after duiresis  2. Cardiac arrestVFF/TdP  - s/p Code Blue 6/19 with defib x 1 - Keep K> 4.0 Mg > 2.0  3. Acute hypoxic respiratory failure - extubated 6/20 - treating for PNA  4. AKI, probable typical - due to HF/cardiorenal/arrest - continue hemodynamic support - creatinine improving 2.5 -> 1.5 -> 1.2  5. AFL - now back in SR - Continue IV amio - Continue heparin. Discussed dosing with PharmD personally.  6. Polysubstance abuse - longstanding. Will be major impediment to care  7. Hypokalemia - supp. Continue spiro  8. Acute bronchitis with purulent sputum - continue cefipime. Sop date set  9. CP - due to CPR. Start tramadol    Ok to go to [facility]. Consult PT  Length  of Stay: 5   Glori Bickers MD 07/08/2018, 10:03 AM  Advanced Heart Failure Team Pager [phone] (M-F; [place])  Please contact [facility] for night-coverage after hours (4p -7a ) and weekends on [email or web address]

## 2018-07-08 NOTE — Progress Notes (Addendum)
[facility] PROGRESS NOTE  Sabrina Holland GGY:[id number] DOB: January 15, 1971 DOA: 07/03/2018 PCP: Gildardo Pounds, NP  48 year old African-American female Breast cancer + mastectomy/chemo--Foll Dr,. Magrinat HF PR EF COPD Polysubstance abuse-found to have cocaine in tox screen Admit 6/16-cardiac arrest 6/18 2/2 torsades Was on fentanyl drip/amiodarone drip  Extubated 6/20 In A flutter Cardiology foll as is on Milrinone  A & Plan Cardiogenic shock 2/2 V. fib arrest with defibrillation X1, magnesium 6/18 As per cardiology input appreciate guidance Currently amiodarone GTT 30/h, digoxin 0.125 daily ICM/Cocaine CMopathy?  as per cardiology-continue milrinone,co-ox---continue guideline directed therapy---?R/L cath as per cardiology-appreciate input No beta-blocker--currently Zaroxolyn 2.5, Aldactone 25, Lasix 3 times daily 80 IV Heparin duration as per cardiology AKI on admission BUN/creatinine 41/2.3 Urinary retention/AKI Hypervolemic hyponatremia on admission Foley catheter placed,-would continue the same and try voiding trial in am Hyponatremia persists likely secondary to chf Watch phosphorus ?  Pneumonia Patient with possible pneumonia based on green sputum on extubation CXR a.m. 2 view-no objective rails fever chills still curtail course if negative to 3 days total Follow-up procalcitonin-lactic acid is down from admission and likely indicative more of stress >infection Mild hyperbilirubinemia, mildly elevated LFTs Some element of shock liver ? Would obtain nonemergent HIV, hepatitis panel  Currently full code No family present she elects Antonio as her proxy decision-maker after my extensive discussion with her She is stable for transfer to to see   Verlon Au, MD  [facility] [address] [place] [email or web address] 7PM-7AM contact night coverage as above 07/08/2018, 7:42 AM  LOS: 5 days   Consultants:  Cardiology  Procedures:  ECHO IMPRESSIONS    1. The left ventricle has a 2D calculated ejection fraction 15%. The cavity size was normal. Left ventricular diastolic Doppler parameters are indeterminate. Left ventricular diffuse hypokinesis.  2. The right ventricle has moderately reduced systolic function. The cavity was mildly enlarged. There is no increase in right ventricular wall thickness. Right ventricular systolic pressure is mildly elevated with an estimated pressure of 24.8 mmHg.  3. Left atrial size was severely dilated.  4. Right atrial size was severely dilated.  5. Mild thickening of the mitral valve leaflet. Mitral valve regurgitation is moderate by color flow Doppler.  6. Tricuspid valve regurgitation is severe.  7. The inferior vena cava was dilated in size with <50% respiratory variability  Antimicrobials:  Cefepime  Interval history/Subjective: Awake alert She does not recall being told that she is going to have a heart cath later this week-this is despite 2 nurses confirming the ongoing discussions from the cardiologist on multiple days She has no chest pain at this time When I go to try and examine her she jumps almost out of the bed.   Objective:  Vitals:  Vitals:   07/08/18 0500 07/08/18 0738  BP: 109/71   Pulse:    Resp: 12   Temp:  99.1 F (37.3 C)  SpO2:      Exam:  Alert asthenic JVD to jaw S1-S2 tachycardic Chest overall is clear Abdomen is soft nontender Lower extremities are soft with trace edema Neurologically intact Poor dentition Mallampati 2 No rash    I have personally reviewed the following:  DATA   Labs:  Na 131, cl 93  Bun/creat--41/2.39->>27/1.17  Imaging studies:  ECHO 6/18  1. The left ventricle has a 2D calculated ejection fraction 15%. The cavity size was normal. Left ventricular diastolic Doppler parameters are indeterminate. Left ventricular diffuse hypokinesis.  2. The right ventricle has moderately reduced systolic  function. The cavity was mildly enlarged.  There is no increase in right ventricular wall thickness. Right ventricular systolic pressure is mildly elevated with an estimated pressure of 24.8 mmHg.  3. Left atrial size was severely dilated.  4. Right atrial size was severely dilated.  5. Mild thickening of the mitral valve leaflet. Mitral valve regurgitation is moderate by color flow Doppler.  6. Tricuspid valve regurgitation is severe.  7. The inferior vena cava was dilated in size with <50% respiratory variability.   Renal US 6/19 1. No acute renal abnormality identified. No hydronephrosis. Bladder is nondistended. 2.  Moderate ascites incidentally noted.  LE dopplers 6/19 Right: There is no evidence of deep vein thrombosis in the lower extremity. No cystic structure found in the popliteal fossa. Left: There is no evidence of deep vein thrombosis in the lower extremity. No cystic structure found in the popliteal fossa.     Medical tests:     Test discussed with performing physician:  No  Decision to obtain old records:  Yes  Review and summation of old records:  Briefly  Scheduled Meds: . aspirin  81 mg Oral Daily  . atorvastatin  40 mg Oral q1800  . budesonide (PULMICORT) nebulizer solution  0.5 mg Nebulization BID  . Chlorhexidine Gluconate Cloth  6 each Topical Daily  . digoxin  0.125 mg Oral Daily  . folic acid  1 mg Intravenous Daily  . furosemide  80 mg Intravenous TID  . ipratropium-albuterol  3 mL Nebulization BID  . losartan  25 mg Oral Daily  . mouth rinse  15 mL Mouth Rinse BID  . multivitamin with minerals  1 tablet Oral Daily  . pantoprazole (PROTONIX) IV  40 mg Intravenous Daily  . potassium chloride  40 mEq Oral BID  . sodium chloride flush  10-40 mL Intracatheter Q12H  . sodium chloride flush  3 mL Intravenous Q12H  . spironolactone  25 mg Oral Daily  . thiamine injection  100 mg Intravenous Daily   Continuous Infusions: . sodium chloride 10 mL/hr at 07/07/18 1800  . amiodarone 30  mg/hr (07/08/18 0148)  . ceFEPime (MAXIPIME) IV 2 g (07/07/18 2109)  . heparin 750 Units/hr (07/07/18 2200)  . milrinone 0.125 mcg/kg/min (07/07/18 1800)    Principal Problem:   Atrial flutter (HCC) Active Problems:   History of breast cancer   Essential hypertension   Polysubstance abuse (HCC)   COPD (chronic obstructive pulmonary disease) (HCC)   Acute on chronic systolic and diastolic heart failure, NYHA class 3 ([place])   Acute respiratory failure ([place])   Endotracheally intubated   Torsades de pointes ([place])   Cardiac arrest ([place])   LOS: 5 days

## 2018-07-09 ENCOUNTER — Inpatient Hospital Stay (HOSPITAL_COMMUNITY): Payer: Medicaid Other

## 2018-07-09 LAB — CBC
HCT: 38.6 % (ref 36.0–46.0)
Hemoglobin: 13 g/dL (ref 12.0–15.0)
MCH: 23.2 pg — ABNORMAL LOW (ref 26.0–34.0)
MCHC: 33.7 g/dL (ref 30.0–36.0)
MCV: 68.9 fL — ABNORMAL LOW (ref 80.0–100.0)
Platelets: 321 10*3/uL (ref 150–400)
RBC: 5.6 MIL/uL — ABNORMAL HIGH (ref 3.87–5.11)
RDW: 25.2 % — ABNORMAL HIGH (ref 11.5–15.5)
WBC: 6.6 10*3/uL (ref 4.0–10.5)
nRBC: 0 % (ref 0.0–0.2)

## 2018-07-09 LAB — PHOSPHORUS: Phosphorus: 2 mg/dL — ABNORMAL LOW (ref 2.5–4.6)

## 2018-07-09 LAB — HEPATIC FUNCTION PANEL
ALT: 25 U/L (ref 0–44)
AST: 72 U/L — ABNORMAL HIGH (ref 15–41)
Albumin: 2.9 g/dL — ABNORMAL LOW (ref 3.5–5.0)
Alkaline Phosphatase: 76 U/L (ref 38–126)
Bilirubin, Direct: 2 mg/dL — ABNORMAL HIGH (ref 0.0–0.2)
Indirect Bilirubin: 1.8 mg/dL — ABNORMAL HIGH (ref 0.3–0.9)
Total Bilirubin: 3.8 mg/dL — ABNORMAL HIGH (ref 0.3–1.2)
Total Protein: 6.6 g/dL (ref 6.5–8.1)

## 2018-07-09 LAB — BASIC METABOLIC PANEL
Anion gap: 12 (ref 5–15)
BUN: 20 mg/dL (ref 6–20)
CO2: 32 mmol/L (ref 22–32)
Calcium: 9.2 mg/dL (ref 8.9–10.3)
Chloride: 86 mmol/L — ABNORMAL LOW (ref 98–111)
Creatinine, Ser: 1.05 mg/dL — ABNORMAL HIGH (ref 0.44–1.00)
GFR calc Af Amer: >60 mL/min (ref >60)
GFR calc non Af Amer: >60 mL/min (ref >60)
Glucose, Bld: 92 mg/dL (ref 70–99)
Potassium: 3.8 mmol/L (ref 3.5–5.1)
Sodium: 130 mmol/L — ABNORMAL LOW (ref 135–145)

## 2018-07-09 LAB — MAGNESIUM: Magnesium: 1.7 mg/dL (ref 1.7–2.4)

## 2018-07-09 LAB — COOXEMETRY PANEL
Carboxyhemoglobin: 1.6 % — ABNORMAL HIGH (ref 0.5–1.5)
Methemoglobin: 0.6 % (ref 0.0–1.5)
O2 Saturation: 54.1 %
Total hemoglobin: 12.3 g/dL (ref 12.0–16.0)

## 2018-07-09 LAB — MYOGLOBIN, URINE: Myoglobin, Ur: <2 ng/mL (ref 0–13)

## 2018-07-09 LAB — HEPARIN LEVEL (UNFRACTIONATED)
Heparin Unfractionated: 0.2 IU/mL — ABNORMAL LOW (ref 0.30–0.70)
Heparin Unfractionated: 0.44 IU/mL (ref 0.30–0.70)

## 2018-07-09 MED ORDER — MAGNESIUM SULFATE 4 GM/100ML IV SOLN
4.0000 g | Freq: Once | INTRAVENOUS | Status: AC
  Administered 2018-07-09: 4 g via INTRAVENOUS
  Filled 2018-07-09: qty 100

## 2018-07-09 MED ORDER — POTASSIUM CHLORIDE CRYS ER 20 MEQ PO TBCR
20.0000 meq | EXTENDED_RELEASE_TABLET | Freq: Once | ORAL | Status: AC
  Administered 2018-07-09: 20 meq via ORAL
  Filled 2018-07-09: qty 1

## 2018-07-09 MED ORDER — PHENOL 1.4 % MT LIQD
1.0000 | OROMUCOSAL | Status: DC | PRN
  Filled 2018-07-09: qty 177

## 2018-07-09 MED ORDER — NITROGLYCERIN 0.4 MG SL SUBL
SUBLINGUAL_TABLET | SUBLINGUAL | Status: AC
  Administered 2018-07-09: 0.4 mg
  Filled 2018-07-09: qty 3

## 2018-07-09 MED ORDER — ENSURE ENLIVE PO LIQD
237.0000 mL | Freq: Two times a day (BID) | ORAL | Status: DC
  Administered 2018-07-09 – 2018-07-11 (×4): 237 mL via ORAL

## 2018-07-09 NOTE — Progress Notes (Signed)
Physical Therapy Discharge Patient Details Name: Sabrina Holland MRN: [id number] DOB: 11-29-1970 Today's Date: 07/09/2018 Time: 8828-0034 PT Time Calculation (min) (ACUTE ONLY): 14 min  Patient discharged from PT services secondary to goals met and no further PT needs identified.  Please see latest therapy progress note for current level of functioning and progress toward goals.    Progress and discharge plan discussed with patient and/or caregiver: Patient/Caregiver agrees with plan  GP     Denice Paradise 07/09/2018, 10:20 AM  [facility] Pager:  [phone]  Office:  [phone]

## 2018-07-09 NOTE — Progress Notes (Signed)
[facility] PROGRESS NOTE  Sabrina Holland BDZ:[id number] DOB: February 19, 1970 DOA: 07/03/2018 PCP: Gildardo Pounds, NP  48 year old African-American female Breast cancer + mastectomy/chemo--Foll Dr,. [place]at HF PR EF COPD Polysubstance abuse-found to have cocaine in tox screen Admit 6/16-cardiac arrest 6/18 2/2 torsades Was on fentanyl drip/amiodarone drip  Extubated 6/20  Cardiology foll as is on Milrinone-In A flutter   A & Plan Cardiogenic shock 2/2 V. fib arrest with defibrillation X1, magnesium 6/18 As per cardiology input appreciate guidance Currently amiodarone GTT 30/h, digoxin 0.125 daily replace magnesium orally to goal above 2 Cardiology deciding to transfer to 2 central today ICM/Cocaine CMopathy?  EF 34%--HDQQI systolic AECHF as per cardiology-continue milrinone,co-ox---continue guideline directed therapy---?R/L cath later this week? No beta-blocker--currently Zaroxolyn 2.5, Aldactone 25, Lasix 3 times daily 80 IV --- Still very volume overloaded from heart failure and cardiology adjusting medications-currently -2.9 L AKI on admission BUN/creatinine 41/2.3 Urinary retention/AKI Hypervolemic hyponatremia 2/2  CHF on admission Foley catheter placed--attempt voiding trial clamp catheter but keep until cardiology decides not needed ?  Pneumonia Patient with possible pneumonia based on green sputum on extubation CXR this a.m. shows clearing-transition 6/23 to oral antibiotics to complete on 6/25 Mild hyperbilirubinemia, mildly elevated LFTs Some element of shock liver ? LFTs not done this morning-repeat in a.m.-given her history may require HIV and hepatitis panel if not done previously   Currently full code I have updated her alone-I have explained to her that her current habits do not portend well for mortality and I explained this in layman's terms-she understands-if she gives me permission I will talk to her son regarding these issues She is stable for  transfer to to see   Verlon Au, MD  [facility] [address] [place] [email or web address] 7PM-7AM contact night coverage as above 07/09/2018, 10:00 AM  LOS: 6 days   Consultants:  Cardiology  Procedures:  ECHO IMPRESSIONS    1. The left ventricle has a 2D calculated ejection fraction 15%. The cavity size was normal. Left ventricular diastolic Doppler parameters are indeterminate. Left ventricular diffuse hypokinesis.  2. The right ventricle has moderately reduced systolic function. The cavity was mildly enlarged. There is no increase in right ventricular wall thickness. Right ventricular systolic pressure is mildly elevated with an estimated pressure of 24.8 mmHg.  3. Left atrial size was severely dilated.  4. Right atrial size was severely dilated.  5. Mild thickening of the mitral valve leaflet. Mitral valve regurgitation is moderate by color flow Doppler.  6. Tricuspid valve regurgitation is severe.  7. The inferior vena cava was dilated in size with <50% respiratory variability  Antimicrobials:  Cefepime  Interval history/Subjective:  Walked around the unit 4 laps highest heart rate was 110 Feels much better Shortness of breath is better in addition  She cut her tongue when she came into the hospital and has not really been able to eat she is mainly drinking and I feel that her low sodium might be secondary to low solute   Objective:  Vitals:  Vitals:   07/09/18 0400 07/09/18 0757  BP:    Pulse:    Resp:    Temp: (!) 97.1 F (36.2 C) (!) 97.4 F (36.3 C)  SpO2:      Exam:   JVD to jaw, cut over right lateral tongue S1-S2 tachycardic Chest clear Abdomen is soft nontender Lower extremities without edema Neurologically intact Poor dentition Mallampati 2 No rash    I have personally reviewed the following:  DATA  Labs:  Na 131, cl 93  Bun/creat--41/2.39->>27/1.17  Imaging studies:  ECHO 6/18  1. The left ventricle has a 2D calculated  ejection fraction 15%. The cavity size was normal. Left ventricular diastolic Doppler parameters are indeterminate. Left ventricular diffuse hypokinesis.  2. The right ventricle has moderately reduced systolic function. The cavity was mildly enlarged. There is no increase in right ventricular wall thickness. Right ventricular systolic pressure is mildly elevated with an estimated pressure of 24.8 mmHg.  3. Left atrial size was severely dilated.  4. Right atrial size was severely dilated.  5. Mild thickening of the mitral valve leaflet. Mitral valve regurgitation is moderate by color flow Doppler.  6. Tricuspid valve regurgitation is severe.  7. The inferior vena cava was dilated in size with <50% respiratory variability.   Renal US 6/19 1. No acute renal abnormality identified. No hydronephrosis. Bladder is nondistended. 2.  Moderate ascites incidentally noted.  LE dopplers 6/19 Right: There is no evidence of deep vein thrombosis in the lower extremity. No cystic structure found in the popliteal fossa. Left: There is no evidence of deep vein thrombosis in the lower extremity. No cystic structure found in the popliteal fossa.     Medical tests:  BUNs/creatinine 20/1.05 sodium down to 130  Phosphorus remains 2.0  Test discussed with performing physician:  Chest x-ray shows clearing  Decision to obtain old records:  Yes  Review and summation of old records:  Briefly  Scheduled Meds: . aspirin  81 mg Oral Daily  . atorvastatin  40 mg Oral q1800  . budesonide (PULMICORT) nebulizer solution  0.5 mg Nebulization BID  . Chlorhexidine Gluconate Cloth  6 each Topical Daily  . digoxin  0.125 mg Oral Daily  . folic acid  1 mg Oral Daily  . furosemide  80 mg Intravenous TID  . ipratropium-albuterol  3 mL Nebulization BID  . losartan  25 mg Oral Daily  . mouth rinse  15 mL Mouth Rinse BID  . multivitamin with minerals  1 tablet Oral Daily  . pantoprazole  40 mg Oral Daily  .  potassium chloride  40 mEq Oral BID  . sodium chloride flush  10-40 mL Intracatheter Q12H  . sodium chloride flush  3 mL Intravenous Q12H  . spironolactone  25 mg Oral Daily  . thiamine  100 mg Oral Daily   Continuous Infusions: . sodium chloride Stopped (07/08/18 1226)  . amiodarone 30 mg/hr (07/09/18 0055)  . ceFEPime (MAXIPIME) IV 2 g (07/08/18 2056)  . heparin 750 Units/hr (07/08/18 1900)  . milrinone 0.125 mcg/kg/min (07/08/18 2050)    Principal Problem:   Atrial flutter (HCC) Active Problems:   History of breast cancer   Essential hypertension   Polysubstance abuse (HCC)   COPD (chronic obstructive pulmonary disease) (HCC)   Acute on chronic systolic and diastolic heart failure, NYHA class 3 ([place])   Acute respiratory failure ([place])   Endotracheally intubated   Torsades de pointes ([place])   Cardiac arrest ([place])   LOS: 6 days

## 2018-07-09 NOTE — Progress Notes (Signed)
[place] for Heparin Indication: atrial fibrillation  Allergies  Allergen Reactions  . Penicillins Other (See Comments)    Pt had slight throat swelling Has patient had a PCN reaction causing immediate rash, facial/tongue/throat swelling, SOB or lightheadedness with hypotension: yes Has patient had a PCN reaction causing severe rash involving mucus membranes or skin necrosis: No Has patient had a PCN reaction that required hospitalization No Has patient had a PCN reaction occurring within the last 10 years: No If all of the above answers are "NO", then may proceed with Cephalosporin use.     Patient Measurements: Height: 5\' 5"  (165.1 cm) Weight: 122 lb 2.2 oz (55.4 kg) IBW/kg (Calculated) : 57  Heparin dosing weight: 57.6  Vital Signs: Temp: 97.3 F (36.3 C) (06/22 1112) Temp Source: Axillary (06/22 1112) BP: 143/102 (06/22 1800) Pulse Rate: 94 (06/22 1800)  Labs: Recent Labs    07/07/18 0254  07/08/18 0342 07/09/18 0619 07/09/18 1634 07/09/18 1720  HGB 10.8*  --  11.5*  --   --  13.0  HCT 33.2*  --  34.2*  --   --  38.6  PLT 295  --  254  --   --  321  HEPARINUNFRC  --    < > 0.46 0.20* 0.44  --   CREATININE 1.50*  --  1.17* 1.05*  --   --    < > = values in this interval not displayed.    Estimated Creatinine Clearance: 56.1 mL/min (A) (by C-G formula based on SCr of 1.05 mg/dL (H)).   Assessment: 48 yr old female c/o SOB x2d now associated w/ chest tightness, found to be in Afib. Patient is in the ICU s/p torsades / VF episode. Pharmacy dosing heparin.  Heparin level ~ 6 hrs after rate increase to 900 units/hr was 0.44 units/ml, which is within the target therapeutic range. Per RN, pink urine from earlier today has cleared, but pt still with some bleeding from punctures in mouth. Hgb/Hct improved with this afternoon's labs.   Goal of Therapy:  Monitor platelets by anticoagulation protocol: Yes  Heparin level = 0.3 to 0.7  units/ml   Plan:  Continue heparin drip at 900 units/hr Check heparin level in 6 hrs and daily Monitor daily CBC Continue to monitor for signs/symptoms of bleeding  Thank you for the opportunity to participate in the care of this patient.  Gillermina Hu, PharmD, BCPS, [facility] Clinical Pharmacist 07/09/2018 6:38 PM Please check [email or web address] for unit-specific pharmacist phone numbers

## 2018-07-09 NOTE — Evaluation (Signed)
Occupational Therapy Evaluation Patient Details Name: Sabrina Holland MRN: [id number] DOB: [birth year] Today's Date: 07/09/2018    History of Present Illness Pt is a 48 y/o female admitted on 6/16 presenting with SOB x 3 days, chest/rib pain, LE edema. Found in AF with RVR and CHF. 6/18 Became unresponsive, ACLS x 5 minutes and defib x 1 before ROSC; intubated. Extubated 6/20. PMH: TIA, HTN, COPD, breast cancer s/p mastectomy, polysubstance abuse.    Clinical Impression   PTA patient independent and working. Admitted for above and presenting at baseline modified independent level for self care, mobility and transfers.  She has been educated on energy conservation techniques and home safety.  Based on performance today, no further OT needs have been identified and pt is in agreement with recommendations. OT signing off.     Follow Up Recommendations  No OT follow up;Supervision - Intermittent    Equipment Recommendations  None recommended by OT    Recommendations for Other Services       Precautions / Restrictions Precautions Precautions: Fall Restrictions Weight Bearing Restrictions: No      Mobility Bed Mobility               General bed mobility comments: NT in transport chair on arrival.    Transfers Overall transfer level: Needs assistance Equipment used: None Transfers: Sit to/from Stand Sit to Stand: Independent         General transfer comment: no assist required    Balance Overall balance assessment: Independent                                         ADL either performed or assessed with clinical judgement   ADL Overall ADL's : Modified independent;At baseline                                       General ADL Comments: no assist required for ADLs     Vision         Perception     Praxis      Pertinent Vitals/Pain Pain Assessment: No/denies pain     Hand Dominance Right   Extremity/Trunk  Assessment Upper Extremity Assessment Upper Extremity Assessment: Overall WFL for tasks assessed   Lower Extremity Assessment Lower Extremity Assessment: Defer to PT evaluation       Communication Communication Communication: No difficulties   Cognition Arousal/Alertness: Awake/alert Behavior During Therapy: WFL for tasks assessed/performed Overall Cognitive Status: Within Functional Limits for tasks assessed                                     General Comments  reviewed energy conservation technique and home safety    Exercises     Shoulder Instructions      Home Living Family/patient expects to be discharged to:: Private residence Living Arrangements: Children Available Help at Discharge: Family Type of Home: Apartment Home Access: Level entry     Home Layout: One level     Bathroom Shower/Tub: Teacher, early years/pre: Standard     Home Equipment: None          Prior Functioning/Environment Level of Independence: Independent        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        OT Problem List: Decreased strength      OT Treatment/Interventions:      OT Goals(Current goals can be found in the care plan section) Acute Rehab OT Goals Patient Stated Goal: feel better OT Goal Formulation: With patient Time For Goal Achievement: 07/23/18 Potential to Achieve Goals: Good  OT Frequency:     Barriers to D/C:            Co-evaluation              AM-PAC OT "6 Clicks" Daily Activity     Outcome Measure Help from another person eating meals?: None Help from another person taking care of personal grooming?: None Help from another person toileting, which includes using toliet, bedpan, or urinal?: None Help from another person bathing (including washing, rinsing, drying)?: None Help from another person to put on and taking off regular upper body clothing?: None Help from another person to put on and taking off regular lower  body clothing?: None 6 Click Score: 24   End of Session Nurse Communication: Mobility status  Activity Tolerance: Patient tolerated treatment well Patient left: in chair;with call bell/phone within reach;with chair alarm set  OT Visit Diagnosis: Unsteadiness on feet (R26.81);Muscle weakness (generalized) (M62.81)                Time: 9794-8016 OT Time Calculation (min): 11 min Charges:  OT General Charges $OT Visit: 1 Visit OT Evaluation $OT Eval Low Complexity: 1 Low  Delight Stare, OT Acute Rehabilitation Services Pager [phone] Office [phone]   Delight Stare 07/09/2018, 1:16 PM

## 2018-07-09 NOTE — Progress Notes (Addendum)
[place] for  heparin Indication: atrial fibrillation  Allergies  Allergen Reactions  . Penicillins Other (See Comments)    Pt had slight throat swelling Has patient had a PCN reaction causing immediate rash, facial/tongue/throat swelling, SOB or lightheadedness with hypotension: yes Has patient had a PCN reaction causing severe rash involving mucus membranes or skin necrosis: No Has patient had a PCN reaction that required hospitalization No Has patient had a PCN reaction occurring within the last 10 years: No If all of the above answers are "NO", then may proceed with Cephalosporin use.     Patient Measurements: Height: 5\' 5"  (165.1 cm) Weight: 129 lb 10.1 oz (58.8 kg) IBW/kg (Calculated) : 57  Heparin dosing weight: 57.6  Vital Signs: Temp: 97.1 F (36.2 C) (06/22 0400) Temp Source: Axillary (06/22 0400) BP: 95/63 (06/22 0100) Pulse Rate: 84 (06/22 0100)  Labs: Recent Labs    07/07/18 0254  07/07/18 2120 07/08/18 0342 07/09/18 0619  HGB 10.8*  --   --  11.5*  --   HCT 33.2*  --   --  34.2*  --   PLT 295  --   --  254  --   HEPARINUNFRC  --    < > 0.51 0.46 0.20*  CREATININE 1.50*  --   --  1.17* 1.05*   < > = values in this interval not displayed.    Estimated Creatinine Clearance: 57.7 mL/min (A) (by C-G formula based on SCr of 1.05 mg/dL (H)).    [name] c/o SOB x2d now associated w/ chest tightness, found to be in Afib. Patient is in the ICU s/p torsades / VF episode. Pharmacy dosing heparin.  Heparin level returned SUBtherapeutic at 0.20 on heparin 750 units/hr. No issues with line per nurse; no signs or symptoms of bleeding noted. No CBC this AM, will order one for this afternoon with a repeat heparin level.  Goal of Therapy:  Monitor platelets by anticoagulation protocol: Yes  Heparin level = 0.3 to 0.7   Plan:  Increase heparin drip to 900 units / hr Heparin level and CBC in 8 hours at  1600 Daily heparin level and CBC daily Continue to monitor signs/symptoms of bleeding   Thank you for the interesting consult and for involving pharmacy in this patient's care.  Tamela Gammon, PharmD 07/09/2018 7:56 AM PGY-1 Pharmacy Resident Direct Phone: [phone] Please check [email or web address] for unit-specific pharmacist phone numbers

## 2018-07-09 NOTE — Progress Notes (Signed)
OT Cancellation Note  Patient Details Name: Sabrina Holland MRN: [id number] DOB: [birth year]   Cancelled Treatment:    Reason Eval/Treat Not Completed: Patient at procedure or test/ unavailable. Will follow and initiate eval when able.   Delight Stare, OT Acute Rehabilitation Services Pager [phone] Office [phone]   Delight Stare 07/09/2018, 9:37 AM

## 2018-07-09 NOTE — Progress Notes (Signed)
Nutrition Follow-up  RD working remotely.  DOCUMENTATION CODES:   Not applicable  INTERVENTION:   - Continue MVI with minerals daily  - Ensure Enlive po BID, each supplement provides 350 kcal and 20 grams of protein  NUTRITION DIAGNOSIS:   Inadequate oral intake related to inability to eat as evidenced by NPO status.  Progressing, pt now on a Heart Healthy diet  GOAL:   Patient will meet greater than or equal to 90% of their needs  Progressing  MONITOR:   PO intake, Supplement acceptance, Labs, Weight trends, I & O's  REASON FOR ASSESSMENT:   Ventilator    ASSESSMENT:   48 year old female who presented to the ED on 6/16 with SOB and chest pain. PMH of HTN, COPD, CHF, TIA, polysubstance abuse, previous breast cancer s/p mastectomy.  6/18 - Code Blue due to pt unresponsive and in Torsades, VF arrest, intubated 6/19 - Cardiology stopped TF due to worsening hypoxia 6/20 - extubated, diet advanced to regular textures with thin liquids  Noted plan for R/L cath this week after diuresis. Weight down a total of 4 lbs since admit. Per HF team, UOP good but pt is drinking a lot. Fluid restriction of 1800 ml initiated.  RD will order an oral nutrition supplement to aid pt in meeting kcal and protein needs.  Meal Completion: 70-100% x 3 meals  Medications reviewed and include: folic acid, Lasix, MVI with minerals, Protonix, K-dur 40 mEq BID, spironolactone, thiamine, amiodarone, IV abx  Labs reviewed: sodium 130, phosphorus 2.0  UOP: 5625 ml x 24 hours I/O's: -2.9 L since admit  Diet Order:   Diet Order            Diet Heart Room service appropriate? Yes; Fluid consistency: Thin; Fluid restriction: 1800 mL Fluid  Diet effective now              EDUCATION NEEDS:   No education needs have been identified at this time  Skin:  Skin Assessment: Reviewed RN Assessment  Last BM:  07/08/18 large type 6  Height:   Ht Readings from Last 1 Encounters:  07/03/18 5'  5" (1.651 m)    Weight:   Wt Readings from Last 1 Encounters:  07/09/18 55.4 kg    Ideal Body Weight:  56.8 kg  BMI:  Body mass index is 20.32 kg/m.  Estimated Nutritional Needs:   Kcal:  1500-1700  Protein:  75-90 grams  Fluid:  per MD    Gaynell Face, MS, RD, LDN Inpatient Clinical Dietitian Pager: [phone] Weekend/After Hours: [phone]

## 2018-07-09 NOTE — Progress Notes (Signed)
HR up in 130s. Pt c/o CP that she described as being different and worse than the pain she has been experiencing over the last several days from chest compressions. 1 dose of nitro given w/ no improvement in pain. EKG completed. No changes noted. Pt repositioned in bed and given hot packs for chest. HR dropped back to 90s and pain improved. Will continue to monitor.

## 2018-07-09 NOTE — Progress Notes (Signed)
Advanced Heart Failure Rounding Note   Subjective:     Extubated on 6/2  Remains on milrinone 0.125 and IV lasix. Lasix increased to 80 IV tid yesterday Over 5L urine output. Weight down 7 pounds. CVP 13  Co-ox stable at 54%. Breathing better. Denies SOB, orthopnea or PND.   Remains on heparin. No bleeding  Objective:   Weight Range:  Vital Signs:   Temp:  [97.1 F (36.2 C)-98.3 F (36.8 C)] 97.4 F (36.3 C) (06/22 0757) Pulse Rate:  [84-99] 84 (06/22 0100) Resp:  [14-27] 16 (06/22 0100) BP: (89-160)/(59-117) 95/63 (06/22 0100) SpO2:  [96 %-100 %] 97 % (06/22 0100) Last BM Date: 07/08/18  Weight change: Filed Weights   07/06/18 0435 07/07/18 0110 07/08/18 0800  Weight: 57.5 kg 57.7 kg 58.8 kg    Intake/Output:   Intake/Output Summary (Last 24 hours) at 07/09/2018 0904 Last data filed at 07/09/2018 0631 Gross per 24 hour  Intake 1825.91 ml  Output 5125 ml  Net -3299.09 ml     Physical Exam: General:  Sitting up in bed. No resp difficulty HEENT: normal Neck: supple. JVP to jaw  Carotids 2+ bilat; no bruits. No lymphadenopathy or thryomegaly appreciated. Cor: PMI nondisplaced. Regular rate & rhythm. +s3 Lungs: clear Abdomen: soft, nontender, nondistended. No hepatosplenomegaly. No bruits or masses. Good bowel sounds. Extremities: no cyanosis, clubbing, rash, 1+ edema Neuro: alert & orientedx3, cranial nerves grossly intact. moves all 4 extremities w/o difficulty. Affect pleasant   Telemetry: Sinus 80-100. Personally reviewed   Labs: Basic Metabolic Panel: Recent Labs  Lab 07/06/18 0018 07/06/18 0733 07/06/18 2340 07/07/18 0254 07/08/18 0342 07/09/18 0619  NA 130* 130* 134* 134* 131* 130*  K 4.7 4.4 3.4* 3.4* 4.1 3.8  CL 97* 95* 97* 98 93* 86*  CO2 23 24 26 26 28  32  GLUCOSE 119* 184* 163* 106* 107* 92  BUN 36* 41* 38* 37* 27* 20  CREATININE 1.94* 2.39* 1.56* 1.50* 1.17* 1.05*  CALCIUM 9.3 8.9 8.8* 8.9 9.4 9.2  MG 2.7* 2.5*  --  2.0 1.9 1.7   PHOS  --  5.3*  --  3.8 2.2* 2.0*    Liver Function Tests: Recent Labs  Lab 07/03/18 0501 07/06/18 0733 07/07/18 0254 07/08/18 0342  AST 31 42* 45* 59*  ALT 17 19 19 22   ALKPHOS 79 75 73 77  BILITOT 3.7* 2.8* 3.5* 4.0*  PROT 7.3 6.6 6.6 6.8  ALBUMIN 3.5 3.2* 3.1* 3.2*   No results for input(s): LIPASE, AMYLASE in the last 168 hours. No results for input(s): AMMONIA in the last 168 hours.  CBC: Recent Labs  Lab 07/03/18 0501 07/04/18 0174 07/05/18 0319 07/05/18 0416 07/05/18 2358 07/07/18 0254 07/08/18 0342  WBC 5.8 4.8 7.0  --   --  7.5 7.3  NEUTROABS 2.9 2.3  --   --   --   --   --   HGB 13.4 11.6* 12.5 15.0 16.0* 10.8* 11.5*  HCT 42.4 34.9* 36.8 44.0 47.0* 33.2* 34.2*  MCV 74.1* 71.2* 70.4*  --   --  71.7* 70.4*  PLT 241 241 254  --   --  295 254    Cardiac Enzymes: Recent Labs  Lab 07/04/18 0018 07/04/18 0918 07/05/18 0704 07/05/18 1007 07/05/18 1543 07/06/18 0733  CKTOTAL  --   --   --   --   --  140  CKMB  --   --   --   --   --  2.9  TROPONINI 0.06* 0.06* 0.08* 0.08* 0.08*  --     BNP: BNP (last 3 results) Recent Labs    04/16/18 0050 05/21/18 1451 07/03/18 0503  BNP 1,857.5* 1,580.9* 2,450.2*    ProBNP (last 3 results) No results for input(s): PROBNP in the last 8760 hours.    Other results:  Imaging: No results found.   Medications:     Scheduled Medications: . aspirin  81 mg Oral Daily  . atorvastatin  40 mg Oral q1800  . budesonide (PULMICORT) nebulizer solution  0.5 mg Nebulization BID  . Chlorhexidine Gluconate Cloth  6 each Topical Daily  . digoxin  0.125 mg Oral Daily  . folic acid  1 mg Oral Daily  . furosemide  80 mg Intravenous TID  . ipratropium-albuterol  3 mL Nebulization BID  . losartan  25 mg Oral Daily  . mouth rinse  15 mL Mouth Rinse BID  . multivitamin with minerals  1 tablet Oral Daily  . pantoprazole  40 mg Oral Daily  . potassium chloride  40 mEq Oral BID  . sodium chloride flush  10-40 mL  Intracatheter Q12H  . sodium chloride flush  3 mL Intravenous Q12H  . spironolactone  25 mg Oral Daily  . thiamine  100 mg Oral Daily    Infusions: . sodium chloride Stopped (07/08/18 1226)  . amiodarone 30 mg/hr (07/09/18 0055)  . ceFEPime (MAXIPIME) IV 2 g (07/08/18 2056)  . heparin 750 Units/hr (07/08/18 1900)  . milrinone 0.125 mcg/kg/min (07/08/18 2050)    PRN Medications: sodium chloride, acetaminophen, albuterol, hydrALAZINE, ondansetron (ZOFRAN) IV, senna-docusate, sodium chloride flush, sodium chloride flush, sorbitol, traMADol   Assessment/Plan:    1. Acute on chronic systolic HF -> cardiogenic shock - Longstanding EF 25-30%. Suspect NICM but hasn't agreed to ischemic w/u to date - Echo 07/05/18 EF 15% RV moderately to severely down Personally reviewed - Suspect due to polysubstance abuse +/- tachy-induced. - Co-ox stable at 54%. Will continue milrinone at 0.125 for now - Diuresing very well on IV lasix. No weight on chart this am.  - CVP 13. Continue diuresis   - Continue sprio and dig - Continue losartan 25 - No b-blocker yet due to shock - Will need R/L cath this week after diuresis. Possibly tomorrow or Wednesday  2. Cardiac arrestVFF/TdP  - s/p Code Blue 6/19 with defib x 1 - Keep K> 4.0 Mg > 2.0  3. Acute hypoxic respiratory failure - extubated 6/20 - treating for PNA  4. AKI, probable typical - due to HF/cardiorenal/arrest - continue hemodynamic support - creatinine improving 2.5 -> 1.5 -> 1.2 -> 1.0  5. AFL - now back in SR - Continue IV amio - Continue heparin. Discussed dosing with PharmD personally.  6. Polysubstance abuse - longstanding. Will be major impediment to care  7. Hypokalemia/Hypomagnesemia - K 3.8 Mag 1.8. will  supp. Continue spiro  8. Acute bronchitis with purulent sputum - continue cefipime. Stop date set   [place] to go to [facility]. Consult PT  Length of Stay: 6   Glori Bickers MD 07/09/2018, 9:04 AM  Advanced Heart  Failure Team Pager [phone] (M-F; [place])  Please contact [facility] for night-coverage after hours (4p -7a ) and weekends on [email or web address]

## 2018-07-09 NOTE — Progress Notes (Addendum)
Physical Therapy Treatment and D/C  Patient Details Name: Sabrina Holland MRN: [id number] DOB: 08/05/1970 Today's Date: 07/09/2018    History of Present Illness Patient is a 48 y/o female presenting to the ED on 07/03/18 with SOB. Past medical history significant of TIA; HTN; COPD; breast cancer s/p mastectomy; and polysubstance abuse. Found to be in atrial flutter with RVR, Patient also is found to be in CHF. Admitted for further work-up.    PT Comments    Pt admitted with above diagnosis. Pt currently without significant functional limitations with no LOB with challenges. Pt with incr distance on unit.  Met all goals.  Pt does not need skilled PT as she is independent with all mobility. Will sign off.    Follow Up Recommendations  No PT follow up;Supervision - Intermittent     Equipment Recommendations  None recommended by PT    Recommendations for Other Services       Precautions / Restrictions Precautions Precautions: Fall Restrictions Weight Bearing Restrictions: No    Mobility  Bed Mobility               General bed mobility comments: NT in transport chair on arrival.    Transfers Overall transfer level: Needs assistance Equipment used: None Transfers: Sit to/from Stand Sit to Stand: Independent            Ambulation/Gait Ambulation/Gait assistance: Independent Gait Distance (Feet): 1480 Feet Assistive device: None Gait Pattern/deviations: Step-through pattern;WFL(Within Functional Limits) Gait velocity: decreased Gait velocity interpretation: >2.62 ft/sec, indicative of community ambulatory General Gait Details: HR to 107 bpm with gait.  Pt able to withstand challenges to balance with good balance.    Stairs             Wheelchair Mobility    Modified Rankin (Stroke Patients Only)       Balance Overall balance assessment: Independent                                          Cognition Arousal/Alertness:  Awake/alert Behavior During Therapy: WFL for tasks assessed/performed Overall Cognitive Status: Within Functional Limits for tasks assessed                                        Exercises      General Comments        Pertinent Vitals/Pain Pain Assessment: No/denies pain    Home Living                      Prior Function            PT Goals (current goals can now be found in the care plan section) Acute Rehab PT Goals Patient Stated Goal: feel better Progress towards PT goals: Progressing toward goals    Frequency    Min 3X/week      PT Plan Current plan remains appropriate    Co-evaluation              AM-PAC PT "6 Clicks" Mobility   Outcome Measure  Help needed turning from your back to your side while in a flat bed without using bedrails?: None Help needed moving from lying on your back to sitting on the side of a flat bed without using bedrails?: None Help needed  moving to and from a bed to a chair (including a wheelchair)?: None Help needed standing up from a chair using your arms (e.g., wheelchair or bedside chair)?: None Help needed to walk in hospital room?: None Help needed climbing 3-5 steps with a railing? : None 6 Click Score: 24    End of Session Equipment Utilized During Treatment: Gait belt Activity Tolerance: Patient tolerated treatment well Patient left: with call bell/phone within reach;in chair;with chair alarm set(lab entering room) Nurse Communication: Mobility status PT Visit Diagnosis: Unsteadiness on feet (R26.81);Muscle weakness (generalized) (M62.81)     Time: 7227-7375 PT Time Calculation (min) (ACUTE ONLY): 14 min  Charges:  $Gait Training: 8-22 mins                     [place] Pager:  [phone]  Office:  [place] 07/09/2018, 10:18 AM

## 2018-07-10 ENCOUNTER — Encounter (HOSPITAL_COMMUNITY)
Admission: EM | Disposition: A | Discharge status: Home / Self Care | Payer: Self-pay | Source: Home / Self Care | Attending: Internal Medicine

## 2018-07-10 HISTORY — PX: RIGHT/LEFT HEART CATH AND CORONARY ANGIOGRAPHY: CATH118266

## 2018-07-10 LAB — POCT I-STAT 7, (LYTES, BLD GAS, ICA,H+H)
Acid-Base Excess: 9 mmol/L — ABNORMAL HIGH (ref 0.0–2.0)
Bicarbonate: 33.2 mmol/L — ABNORMAL HIGH (ref 20.0–28.0)
Calcium, Ion: 0.99 mmol/L — ABNORMAL LOW (ref 1.15–1.40)
HCT: 43 % (ref 36.0–46.0)
Hemoglobin: 14.6 g/dL (ref 12.0–15.0)
O2 Saturation: 100 %
Potassium: 3.9 mmol/L (ref 3.5–5.1)
Sodium: 132 mmol/L — ABNORMAL LOW (ref 135–145)
TCO2: 35 mmol/L — ABNORMAL HIGH (ref 22–32)
pCO2 arterial: 44.7 mmHg (ref 32.0–48.0)
pH, Arterial: 7.479 — ABNORMAL HIGH (ref 7.350–7.450)
pO2, Arterial: 183 mmHg — ABNORMAL HIGH (ref 83.0–108.0)

## 2018-07-10 LAB — MAGNESIUM: Magnesium: 2.5 mg/dL — ABNORMAL HIGH (ref 1.7–2.4)

## 2018-07-10 LAB — POCT I-STAT EG7
Acid-Base Excess: 10 mmol/L — ABNORMAL HIGH (ref 0.0–2.0)
Acid-Base Excess: 8 mmol/L — ABNORMAL HIGH (ref 0.0–2.0)
Bicarbonate: 33.8 mmol/L — ABNORMAL HIGH (ref 20.0–28.0)
Bicarbonate: 36.6 mmol/L — ABNORMAL HIGH (ref 20.0–28.0)
Calcium, Ion: 1.05 mmol/L — ABNORMAL LOW (ref 1.15–1.40)
Calcium, Ion: 1.16 mmol/L (ref 1.15–1.40)
HCT: 45 % (ref 36.0–46.0)
HCT: 47 % — ABNORMAL HIGH (ref 36.0–46.0)
Hemoglobin: 15.3 g/dL — ABNORMAL HIGH (ref 12.0–15.0)
Hemoglobin: 16 g/dL — ABNORMAL HIGH (ref 12.0–15.0)
O2 Saturation: 71 %
O2 Saturation: 73 %
Potassium: 4.2 mmol/L (ref 3.5–5.1)
Potassium: 4.5 mmol/L (ref 3.5–5.1)
Sodium: 129 mmol/L — ABNORMAL LOW (ref 135–145)
Sodium: 132 mmol/L — ABNORMAL LOW (ref 135–145)
TCO2: 35 mmol/L — ABNORMAL HIGH (ref 22–32)
TCO2: 38 mmol/L — ABNORMAL HIGH (ref 22–32)
pCO2, Ven: 50.4 mmHg (ref 44.0–60.0)
pCO2, Ven: 53.8 mmHg (ref 44.0–60.0)
pH, Ven: 7.434 — ABNORMAL HIGH (ref 7.250–7.430)
pH, Ven: 7.441 — ABNORMAL HIGH (ref 7.250–7.430)
pO2, Ven: 37 mmHg (ref 32.0–45.0)
pO2, Ven: 38 mmHg (ref 32.0–45.0)

## 2018-07-10 LAB — BASIC METABOLIC PANEL
Anion gap: 9 (ref 5–15)
BUN: 17 mg/dL (ref 6–20)
CO2: 34 mmol/L — ABNORMAL HIGH (ref 22–32)
Calcium: 9.1 mg/dL (ref 8.9–10.3)
Chloride: 84 mmol/L — ABNORMAL LOW (ref 98–111)
Creatinine, Ser: 1.02 mg/dL — ABNORMAL HIGH (ref 0.44–1.00)
GFR calc Af Amer: >60 mL/min (ref >60)
GFR calc non Af Amer: >60 mL/min (ref >60)
Glucose, Bld: 108 mg/dL — ABNORMAL HIGH (ref 70–99)
Potassium: 4.6 mmol/L (ref 3.5–5.1)
Sodium: 127 mmol/L — ABNORMAL LOW (ref 135–145)

## 2018-07-10 LAB — CBC WITH DIFFERENTIAL/PLATELET
Abs Immature Granulocytes: 0.03 10*3/uL (ref 0.00–0.07)
Basophils Absolute: 0 10*3/uL (ref 0.0–0.1)
Basophils Relative: 1 %
Eosinophils Absolute: 0.1 10*3/uL (ref 0.0–0.5)
Eosinophils Relative: 2 %
HCT: 37.7 % (ref 36.0–46.0)
Hemoglobin: 12.9 g/dL (ref 12.0–15.0)
Immature Granulocytes: 0 %
Lymphocytes Relative: 26 %
Lymphs Abs: 1.7 10*3/uL (ref 0.7–4.0)
MCH: 23.3 pg — ABNORMAL LOW (ref 26.0–34.0)
MCHC: 34.2 g/dL (ref 30.0–36.0)
MCV: 68.2 fL — ABNORMAL LOW (ref 80.0–100.0)
Monocytes Absolute: 1 10*3/uL (ref 0.1–1.0)
Monocytes Relative: 15 %
Neutro Abs: 3.7 10*3/uL (ref 1.7–7.7)
Neutrophils Relative %: 56 %
Platelets: 328 10*3/uL (ref 150–400)
RBC: 5.53 MIL/uL — ABNORMAL HIGH (ref 3.87–5.11)
RDW: 24.7 % — ABNORMAL HIGH (ref 11.5–15.5)
WBC: 6.7 10*3/uL (ref 4.0–10.5)
nRBC: 0 % (ref 0.0–0.2)

## 2018-07-10 LAB — CULTURE, RESPIRATORY W GRAM STAIN: Culture: NORMAL

## 2018-07-10 LAB — COOXEMETRY PANEL
Carboxyhemoglobin: 1.9 % — ABNORMAL HIGH (ref 0.5–1.5)
Methemoglobin: 0.8 % (ref 0.0–1.5)
O2 Saturation: 51 %
Total hemoglobin: 13.2 g/dL (ref 12.0–16.0)

## 2018-07-10 LAB — HEPARIN LEVEL (UNFRACTIONATED): Heparin Unfractionated: 0.32 IU/mL (ref 0.30–0.70)

## 2018-07-10 LAB — PHOSPHORUS: Phosphorus: 1.9 mg/dL — ABNORMAL LOW (ref 2.5–4.6)

## 2018-07-10 SURGERY — RIGHT/LEFT HEART CATH AND CORONARY ANGIOGRAPHY
Anesthesia: LOCAL

## 2018-07-10 MED ORDER — VERAPAMIL HCL 2.5 MG/ML IV SOLN
INTRAVENOUS | Status: AC
  Filled 2018-07-10: qty 2

## 2018-07-10 MED ORDER — IOHEXOL 350 MG/ML SOLN
INTRAVENOUS | Status: DC | PRN
  Administered 2018-07-10: 14:00:00 30 mL via INTRAVENOUS

## 2018-07-10 MED ORDER — LOSARTAN POTASSIUM 25 MG PO TABS
25.0000 mg | ORAL_TABLET | Freq: Two times a day (BID) | ORAL | Status: DC
  Administered 2018-07-10 – 2018-07-12 (×5): 25 mg via ORAL
  Filled 2018-07-10 (×5): qty 1

## 2018-07-10 MED ORDER — FENTANYL CITRATE (PF) 100 MCG/2ML IJ SOLN
INTRAMUSCULAR | Status: DC | PRN
  Administered 2018-07-10: 25 ug via INTRAVENOUS

## 2018-07-10 MED ORDER — VERAPAMIL HCL 2.5 MG/ML IV SOLN
INTRAVENOUS | Status: DC | PRN
  Administered 2018-07-10: 14:00:00 10 mL via INTRA_ARTERIAL

## 2018-07-10 MED ORDER — SODIUM CHLORIDE 0.9% FLUSH: 3.0000 mL | INTRAVENOUS | Status: DC | PRN

## 2018-07-10 MED ORDER — SODIUM CHLORIDE 0.9 % IV SOLN: INTRAVENOUS | Status: DC

## 2018-07-10 MED ORDER — HEPARIN (PORCINE) IN NACL 1000-0.9 UT/500ML-% IV SOLN
INTRAVENOUS | Status: DC | PRN
  Administered 2018-07-10 (×2): 500 mL

## 2018-07-10 MED ORDER — HEPARIN (PORCINE) IN NACL 1000-0.9 UT/500ML-% IV SOLN
INTRAVENOUS | Status: AC
  Filled 2018-07-10: qty 1000

## 2018-07-10 MED ORDER — LIDOCAINE HCL (PF) 1 % IJ SOLN
INTRAMUSCULAR | Status: DC | PRN
  Administered 2018-07-10: 5 mL
  Administered 2018-07-10: 10 mL

## 2018-07-10 MED ORDER — MIDAZOLAM HCL 2 MG/2ML IJ SOLN
INTRAMUSCULAR | Status: AC
  Filled 2018-07-10: qty 2

## 2018-07-10 MED ORDER — SODIUM CHLORIDE 0.9 % IV SOLN: 250.0000 mL | INTRAVENOUS | Status: DC | PRN

## 2018-07-10 MED ORDER — SODIUM CHLORIDE 0.9% FLUSH
3.0000 mL | Freq: Two times a day (BID) | INTRAVENOUS | Status: DC
  Administered 2018-07-10: 3 mL via INTRAVENOUS

## 2018-07-10 MED ORDER — CHLORHEXIDINE GLUCONATE CLOTH 2 % EX PADS
6.0000 | MEDICATED_PAD | Freq: Every day | CUTANEOUS | Status: DC
  Administered 2018-07-10 – 2018-07-12 (×2): 6 via TOPICAL

## 2018-07-10 MED ORDER — FENTANYL CITRATE (PF) 100 MCG/2ML IJ SOLN
INTRAMUSCULAR | Status: AC
  Filled 2018-07-10: qty 2

## 2018-07-10 MED ORDER — MIDAZOLAM HCL 2 MG/2ML IJ SOLN
INTRAMUSCULAR | Status: DC | PRN
  Administered 2018-07-10: 2 mg via INTRAVENOUS

## 2018-07-10 MED ORDER — LIDOCAINE HCL (PF) 1 % IJ SOLN
INTRAMUSCULAR | Status: AC
  Filled 2018-07-10: qty 30

## 2018-07-10 SURGICAL SUPPLY — 12 items
CATH 5FR JL3.5 JR4 ANG PIG MP (CATHETERS) ×1 IMPLANT
CATH SWAN GANZ 7F STRAIGHT (CATHETERS) ×1 IMPLANT
DEVICE RAD TR BAND REGULAR (VASCULAR PRODUCTS) ×1 IMPLANT
GLIDESHEATH SLEND SS 6F .021 (SHEATH) ×1 IMPLANT
GUIDEWIRE INQWIRE 1.5J.035X260 (WIRE) IMPLANT
INQWIRE 1.5J .035X260CM (WIRE) ×4
KIT HEART LEFT (KITS) ×2 IMPLANT
PACK CARDIAC CATHETERIZATION (CUSTOM PROCEDURE TRAY) ×2 IMPLANT
SHEATH GLIDE SLENDER 4/5FR (SHEATH) IMPLANT
SHEATH PINNACLE 7F 10CM (SHEATH) ×1 IMPLANT
TRANSDUCER W/STOPCOCK (MISCELLANEOUS) ×2 IMPLANT
TUBING CIL FLEX 10 FLL-RA (TUBING) ×2 IMPLANT

## 2018-07-10 NOTE — Progress Notes (Addendum)
Pt received from [place], handoff report received, pt is waiting for her surgery this time, pre-surgical procedures done, Consent is on chart,  pt's immediate needs addressed, family member notified via phone, pt denies any chest pain, SOB, distress this moment Will continue to monitor the patient  Palma Holter, RN

## 2018-07-10 NOTE — Interval H&P Note (Signed)
History and Physical Interval Note:  07/10/2018 1:46 PM  Sabrina Holland  has presented today for surgery, with the diagnosis of heart failure.  The various methods of treatment have been discussed with the patient and family. After consideration of risks, benefits and other options for treatment, the patient has consented to  Procedure(s): RIGHT/LEFT HEART CATH AND CORONARY ANGIOGRAPHY (N/A) and possible coronary angioplasty as a surgical intervention.  The patient's history has been reviewed, patient examined, no change in status, stable for surgery.  I have reviewed the patient's chart and labs.  Questions were answered to the patient's satisfaction.     Ahmad Vanwey

## 2018-07-10 NOTE — Progress Notes (Addendum)
[facility] PROGRESS NOTE  Sabrina Holland SWH:[id number] DOB: September 07, 1970 DOA: 07/03/2018 PCP: Gildardo Pounds, NP  48 year old African-American female Breast cancer + mastectomy/chemo--Foll Dr,. Magrin[place] HF PR EF COPD Polysubstance abuse-found to have cocaine in tox screen Admit 6/16-cardiac arrest 6/18 2/2 torsades  Extubated 6/20  Cardiology foll as is on Milrinone -now amiodarone drip-In A flutter  Patient going for cardiac cath this afternoon 6/23 Further decision making regarding planning subsequent to this by cardiology  A & Plan  Cardiogenic shock 2/2 V. fib arrest with defibrillation X1, magnesium 6/18 cont amiodarone GTT 30/h, digoxin 0.125 daily Magnesium 2.5 Cardiology deciding to transfer to 2 central today ICM/Cocaine CMopathy?  EF 66%--ZLDJT systolic AECHF as per cardiology-continue milrinone,co-ox---continue guideline directed therapy--- ?R/L cath 07/10/18 No beta-blocker--currently  digoxin 0.125 Lasix 3 times daily 80 IV Losartan 25 twice daily Aldactone 25 daily --- -5.4 L weight down 58-->51 kg AKI on admission BUN/creatinine 41/2.3 Urinary retention/AKI Hypervolemic hyponatremia 2/2 CHF on admission Patient is developing some alkalosis likely secondary to diuresis Foley catheter placed--attempt voiding trial clamp catheter but keep until cardiology decides not needed ?  Pneumonia Patient with possible pneumonia based on green sputum on extubation CXR has been clear -transition 6/23 to oral antibiotics to complete on 6/25 Mild hyperbilirubinemia, mildly elevated LFTs Some element of shock liver ? Trending downwards to some degree HIV and hepatitis panel added on for AM   Currently full code Long discussion with her son on phone updated fully- She is stable and probably will be able to transfer to 2c later today after cath   Verlon Au, MD  [facility] [address] [place] [email or web address] 7PM-7AM contact night coverage as  above 07/10/2018, 9:27 AM  LOS: 7 days   Consultants:  Cardiology  Procedures:  ECHO IMPRESSIONS    1. The left ventricle has a 2D calculated ejection fraction 15%. The cavity size was normal. Left ventricular diastolic Doppler parameters are indeterminate. Left ventricular diffuse hypokinesis.  2. The right ventricle has moderately reduced systolic function. The cavity was mildly enlarged. There is no increase in right ventricular wall thickness. Right ventricular systolic pressure is mildly elevated with an estimated pressure of 24.8 mmHg.  3. Left atrial size was severely dilated.  4. Right atrial size was severely dilated.  5. Mild thickening of the mitral valve leaflet. Mitral valve regurgitation is moderate by color flow Doppler.  6. Tricuspid valve regurgitation is severe.  7. The inferior vena cava was dilated in size with <50% respiratory variability  CXR FINDINGS: Enlarged cardiac silhouette is stable. PICC line tip cavoatrial junction. ET tube removed. Improved retrocardiac density, some clearing of atelectasis, and effusion. No pneumothorax.  IMPRESSION: Improved aeration.  Antimicrobials:  Cefepime---stop date 6/25  Interval history/Subjective:  Awake alert pleasant in good spirits Breathing comfortably and looks much less swollen No chest pain No fever no shortness of breath, no chills, no Rigor Seems ready to quit some of her habits    Objective:  Vitals:  Vitals:   07/10/18 0700 07/10/18 0808  BP:    Pulse:    Resp: (!) 22   Temp:  (!) 97.3 F (36.3 C)  SpO2:      Exam:   JVD to jaw still S1-S2 tachycardic Chest clear no added sound Abdomen is soft nontender Lower extremities without edema Neurologically intact Poor dentition Mallampati 2 No rash    I have personally reviewed the  Imaging studies:  ECHO 6/18  1. The left ventricle has a 2D calculated  ejection fraction 15%. The cavity size was normal. Left ventricular diastolic  Doppler parameters are indeterminate. Left ventricular diffuse hypokinesis.  2. The right ventricle has moderately reduced systolic function. The cavity was mildly enlarged. There is no increase in right ventricular wall thickness. Right ventricular systolic pressure is mildly elevated with an estimated pressure of 24.8 mmHg.  3. Left atrial size was severely dilated.  4. Right atrial size was severely dilated.  5. Mild thickening of the mitral valve leaflet. Mitral valve regurgitation is moderate by color flow Doppler.  6. Tricuspid valve regurgitation is severe.  7. The inferior vena cava was dilated in size with <50% respiratory variability.   Renal US 6/19 1. No acute renal abnormality identified. No hydronephrosis. Bladder is nondistended. 2.  Moderate ascites incidentally noted.  LE dopplers 6/19 Right: There is no evidence of deep vein thrombosis in the lower extremity. No cystic structure found in the popliteal fossa. Left: There is no evidence of deep vein thrombosis in the lower extremity. No cystic structure found in the popliteal fossa.   Labs:  Na 131-->127  , cl 93-->84  Bun/creat--41/2.39->>27/1.17--->17/1.02  Phosphorus 1.9  Magnesium 2.5  Scheduled Meds: . aspirin  81 mg Oral Daily  . atorvastatin  40 mg Oral q1800  . budesonide (PULMICORT) nebulizer solution  0.5 mg Nebulization BID  . Chlorhexidine Gluconate Cloth  6 each Topical Daily  . Chlorhexidine Gluconate Cloth  6 each Topical Daily  . digoxin  0.125 mg Oral Daily  . feeding supplement (ENSURE ENLIVE)  237 mL Oral BID BM  . folic acid  1 mg Oral Daily  . furosemide  80 mg Intravenous TID  . ipratropium-albuterol  3 mL Nebulization BID  . losartan  25 mg Oral BID  . mouth rinse  15 mL Mouth Rinse BID  . multivitamin with minerals  1 tablet Oral Daily  . pantoprazole  40 mg Oral Daily  . potassium chloride  40 mEq Oral BID  . sodium chloride flush  10-40 mL Intracatheter Q12H  . sodium chloride  flush  3 mL Intravenous Q12H  . spironolactone  25 mg Oral Daily  . thiamine  100 mg Oral Daily   Continuous Infusions: . sodium chloride 10 mL/hr at 07/10/18 0700  . amiodarone 30 mg/hr (07/10/18 0700)  . ceFEPime (MAXIPIME) IV Stopped (07/09/18 2155)  . milrinone 0.125 mcg/kg/min (07/10/18 0700)    Principal Problem:   Atrial flutter (HCC) Active Problems:   History of breast cancer   Essential hypertension   Polysubstance abuse (HCC)   COPD (chronic obstructive pulmonary disease) (HCC)   Acute on chronic systolic and diastolic heart failure, NYHA class 3 ([place])   Acute respiratory failure ([place])   Endotracheally intubated   Torsades de pointes ([place])   Cardiac arrest ([place])   LOS: 7 days

## 2018-07-10 NOTE — Progress Notes (Addendum)
Pt's TR band came off, site Clean, dry, intact. Rt femoral site CDI, radial, femoral pulse strong, +3, pt tolerating soft diet, denies any pain, talking over phone with her family, IV milrinone and Amio contd. NSR on tele most of the time,  Have a little bleeding from mouth, suction set up at bed side and she is using it as required Will continue to monitor the patient  Palma Holter, RN

## 2018-07-10 NOTE — Progress Notes (Signed)
Pt left for OR at 1.07 pm, RN accompanied to [place], RN

## 2018-07-10 NOTE — H&P (View-Only) (Signed)
Advanced Heart Failure Rounding Note   Subjective:     Extubated on 6/2  Remains on milrinone 0.125 and IV lasix. Co-ox 51%. Diuresing briskly on IV lasix. Weight down 9 pounds overnight. 15 pounds total.   Breathing better. Less SOB. No orthopnea or PND. Bleeding from mouth and in urine.  Remains on heparin and IV amiodarone.  Objective:   Weight Range:  Vital Signs:   Temp:  [96.4 F (35.8 C)-97.3 F (36.3 C)] 97.3 F (36.3 C) (06/23 0808) Pulse Rate:  [81-140] 92 (06/23 0500) Resp:  [13-31] 22 (06/23 0700) BP: (95-150)/(56-129) 120/101 (06/23 0500) SpO2:  [73 %-100 %] 97 % (06/23 0500) Weight:  [51.4 kg-55.4 kg] 51.4 kg (06/23 0545) Last BM Date: 07/08/18  Weight change: Filed Weights   07/08/18 0800 07/09/18 0900 07/10/18 0545  Weight: 58.8 kg 55.4 kg 51.4 kg    Intake/Output:   Intake/Output Summary (Last 24 hours) at 07/10/2018 0853 Last data filed at 07/10/2018 0700 Gross per 24 hour  Intake 1584.56 ml  Output 4265 ml  Net -2680.44 ml     Physical Exam: General:  Sitting up in bed. No resp difficulty HEENT: normal bleeding from sore underneath tongue - looks like she bit it Neck: supple. 7-8 Carotids 2+ bilat; no bruits. No lymphadenopathy or thryomegaly appreciated. Cor: PMI nondisplaced. Regular rate & rhythm. + s3 Lungs: clear Abdomen: soft, nontender, nondistended. No hepatosplenomegaly. No bruits or masses. Good bowel sounds. Extremities: no cyanosis, clubbing, rash, edema + Foley  Neuro: alert & orientedx3, cranial nerves grossly intact. moves all 4 extremities w/o difficulty. Affect pleasant    Telemetry: Sinus 80-100. Personally reviewed   Labs: Basic Metabolic Panel: Recent Labs  Lab 07/06/18 0733 07/06/18 2340 07/07/18 0254 07/08/18 0342 07/09/18 0619 07/10/18 0321  NA 130* 134* 134* 131* 130* 127*  K 4.4 3.4* 3.4* 4.1 3.8 4.6  CL 95* 97* 98 93* 86* 84*  CO2 24 26 26 28  32 34*  GLUCOSE 184* 163* 106* 107* 92 108*  BUN 41*  38* 37* 27* 20 17  CREATININE 2.39* 1.56* 1.50* 1.17* 1.05* 1.02*  CALCIUM 8.9 8.8* 8.9 9.4 9.2 9.1  MG 2.5*  --  2.0 1.9 1.7 2.5*  PHOS 5.3*  --  3.8 2.2* 2.0* 1.9*    Liver Function Tests: Recent Labs  Lab 07/06/18 0733 07/07/18 0254 07/08/18 0342 07/09/18 0619  AST 42* 45* 59* 72*  ALT 19 19 22 25   ALKPHOS 75 73 77 76  BILITOT 2.8* 3.5* 4.0* 3.8*  PROT 6.6 6.6 6.8 6.6  ALBUMIN 3.2* 3.1* 3.2* 2.9*   No results for input(s): LIPASE, AMYLASE in the last 168 hours. No results for input(s): AMMONIA in the last 168 hours.  CBC: Recent Labs  Lab 07/04/18 0918 07/05/18 0319  07/05/18 2358 07/07/18 0254 07/08/18 0342 07/09/18 1720 07/10/18 0321  WBC 4.8 7.0  --   --  7.5 7.3 6.6 6.7  NEUTROABS 2.3  --   --   --   --   --   --  3.7  HGB 11.6* 12.5   < > 16.0* 10.8* 11.5* 13.0 12.9  HCT 34.9* 36.8   < > 47.0* 33.2* 34.2* 38.6 37.7  MCV 71.2* 70.4*  --   --  71.7* 70.4* 68.9* 68.2*  PLT 241 254  --   --  295 254 321 328   < > = values in this interval not displayed.    Cardiac Enzymes: Recent Labs  Lab 07/04/18  0018 07/04/18 0918 07/05/18 0704 07/05/18 1007 07/05/18 1543 07/06/18 0733  CKTOTAL  --   --   --   --   --  140  CKMB  --   --   --   --   --  2.9  TROPONINI 0.06* 0.06* 0.08* 0.08* 0.08*  --     BNP: BNP (last 3 results) Recent Labs    04/16/18 0050 05/21/18 1451 07/03/18 0503  BNP 1,857.5* 1,580.9* 2,450.2*    ProBNP (last 3 results) No results for input(s): PROBNP in the last 8760 hours.    Other results:  Imaging: Dg Chest 2 View  Result Date: 07/09/2018 CLINICAL DATA:  Pneumonia. EXAM: CHEST - 2 VIEW COMPARISON:  07/06/2018. FINDINGS: Enlarged cardiac silhouette is stable. PICC line tip cavoatrial junction. ET tube removed. Improved retrocardiac density, some clearing of atelectasis, and effusion. No pneumothorax. IMPRESSION: Improved aeration. Electronically Signed   By: Staci Righter M.D.   On: 07/09/2018 09:42     Medications:      Scheduled Medications: . aspirin  81 mg Oral Daily  . atorvastatin  40 mg Oral q1800  . budesonide (PULMICORT) nebulizer solution  0.5 mg Nebulization BID  . Chlorhexidine Gluconate Cloth  6 each Topical Daily  . Chlorhexidine Gluconate Cloth  6 each Topical Daily  . digoxin  0.125 mg Oral Daily  . feeding supplement (ENSURE ENLIVE)  237 mL Oral BID BM  . folic acid  1 mg Oral Daily  . furosemide  80 mg Intravenous TID  . ipratropium-albuterol  3 mL Nebulization BID  . losartan  25 mg Oral Daily  . mouth rinse  15 mL Mouth Rinse BID  . multivitamin with minerals  1 tablet Oral Daily  . pantoprazole  40 mg Oral Daily  . potassium chloride  40 mEq Oral BID  . sodium chloride flush  10-40 mL Intracatheter Q12H  . sodium chloride flush  3 mL Intravenous Q12H  . spironolactone  25 mg Oral Daily  . thiamine  100 mg Oral Daily    Infusions: . sodium chloride 10 mL/hr at 07/10/18 0700  . amiodarone 30 mg/hr (07/10/18 0700)  . ceFEPime (MAXIPIME) IV Stopped (07/09/18 2155)  . heparin 900 Units/hr (07/10/18 0700)  . milrinone 0.125 mcg/kg/min (07/10/18 0700)    PRN Medications: sodium chloride, acetaminophen, albuterol, hydrALAZINE, ondansetron (ZOFRAN) IV, phenol, senna-docusate, sodium chloride flush, sodium chloride flush, sorbitol, traMADol   Assessment/Plan:    1. Acute on chronic systolic HF -> cardiogenic shock - Longstanding EF 25-30%. Suspect NICM but hasn't agreed to ischemic w/u to date - Echo 07/05/18 EF 15% RV moderately to severely down Personally reviewed - Suspect due to polysubstance abuse +/- tachy-induced. - Tenuous.On milrinone 0.125. Co-ox remains low at 51%.  - Diuresing very well on IV lasix. - CVP 7-8 Continue diuresis with one more dose of IV lasix then switch to po  - Continue sprio and dig - Increase losartan 25 bid - No b-blocker yet due to shock - R/L cath later today  2. Cardiac arrestVFF/TdP  - s/p Code Blue 6/19 with defib x 1 - rhythm  stable - Keep K> 4.0 Mg > 2.0  3. Acute hypoxic respiratory failure - extubated 6/20 - improved with diuresis  - treating for PNA - continue cefipime. Stop date set for 6/25   4. AKI, probable typical - due to HF/cardiorenal/arrest - continue hemodynamic support - creatinine improving 2.5 -> 1.5 -> 1.2 -> 1.0  5. AFL - now  back in SR - Continue IV amio until milrinone off - Hold heparin for 24 hours with oral bleeding and hematuria. Restart tomorrow at lower rate Discussed dosing with PharmD personally.  6. Polysubstance abuse - longstanding. Will be major impediment to care  7. Hypokalemia/Hypomagnesemia - K 3.8 Mag 1.8. will  supp. Continue spiro  8. Acute bronchitis with purulent sputum - continue cefipime. Stop date set for 6/25    CRITICAL CARE Performed by: Glori Bickers  Total critical care time: 35 minutes  Critical care time was exclusive of separately billable procedures and treating other patients.  Critical care was necessary to treat or prevent imminent or life-threatening deterioration.  Critical care was time spent personally by me (independent of midlevel providers or residents) on the following activities: development of treatment plan with patient and/or surrogate as well as nursing, discussions with consultants, evaluation of patient's response to treatment, examination of patient, obtaining history from patient or surrogate, ordering and performing treatments and interventions, ordering and review of laboratory studies, ordering and review of radiographic studies, pulse oximetry and re-evaluation of patient's condition.    Length of Stay: 7   Glori Bickers MD 07/10/2018, 8:53 AM  Advanced Heart Failure Team Pager [phone] (M-F; [place])  Please contact [facility] for night-coverage after hours (4p -7a ) and weekends on [email or web address]

## 2018-07-10 NOTE — Progress Notes (Signed)
ANTICOAGULATION CONSULT NOTE   Pharmacy Consult for Heparin Indication: atrial fibrillation   Patient Measurements: Height: 5\' 5"  (165.1 cm) Weight: 122 lb 2.2 oz (55.4 kg) IBW/kg (Calculated) : 57  Heparin dosing weight: 57.6  Vital Signs: Temp: 96.4 F (35.8 C) (06/23 0000) Temp Source: Axillary (06/23 0000) BP: 150/71 (06/23 0204) Pulse Rate: 91 (06/23 0204)  Labs: Recent Labs    07/08/18 0342 07/09/18 0619 07/09/18 1634 07/09/18 1720 07/10/18 0321  HGB 11.5*  --   --  13.0 12.9  HCT 34.2*  --   --  38.6 37.7  PLT 254  --   --  321 328  HEPARINUNFRC 0.46 0.20* 0.44  --  0.32  CREATININE 1.17* 1.05*  --   --   --     Estimated Creatinine Clearance: 56.1 mL/min (A) (by C-G formula based on SCr of 1.05 mg/dL (H)).   Assessment: 48 yr old female c/o SOB x2d now associated w/ chest tightness, found to be in Afib. Patient is in the ICU s/p torsades / VF episode. Pharmacy dosing heparin.  Heparin level ~ 6 hrs after rate increase to 900 units/hr was 0.44 units/ml, which is within the target therapeutic range. Per RN, pink urine from earlier today has cleared, but pt still with some bleeding from punctures in mouth. Hgb/Hct improved with this afternoon's labs.   6/23 AM update:   Heparin level therapeutic x 2   CBC stable  Goal of Therapy:  Monitor platelets by anticoagulation protocol: Yes  Heparin level = 0.3 to 0.7 units/ml   Plan:  Continue heparin drip at 900 units/hr Daily CBC/HL Continue to monitor for signs/symptoms of bleeding  Narda Bonds, PharmD, BCPS Clinical Pharmacist Phone: [phone]

## 2018-07-10 NOTE — Progress Notes (Signed)
Advanced Heart Failure Rounding Note   Subjective:     Extubated on 6/2  Remains on milrinone 0.125 and IV lasix. Co-ox 51%. Diuresing briskly on IV lasix. Weight down 9 pounds overnight. 15 pounds total.   Breathing better. Less SOB. No orthopnea or PND. Bleeding from mouth and in urine.  Remains on heparin and IV amiodarone.  Objective:   Weight Range:  Vital Signs:   Temp:  [96.4 F (35.8 C)-97.3 F (36.3 C)] 97.3 F (36.3 C) (06/23 0808) Pulse Rate:  [81-140] 92 (06/23 0500) Resp:  [13-31] 22 (06/23 0700) BP: (95-150)/(56-129) 120/101 (06/23 0500) SpO2:  [73 %-100 %] 97 % (06/23 0500) Weight:  [51.4 kg-55.4 kg] 51.4 kg (06/23 0545) Last BM Date: 07/08/18  Weight change: Filed Weights   07/08/18 0800 07/09/18 0900 07/10/18 0545  Weight: 58.8 kg 55.4 kg 51.4 kg    Intake/Output:   Intake/Output Summary (Last 24 hours) at 07/10/2018 0853 Last data filed at 07/10/2018 0700 Gross per 24 hour  Intake 1584.56 ml  Output 4265 ml  Net -2680.44 ml     Physical Exam: General:  Sitting up in bed. No resp difficulty HEENT: normal bleeding from sore underneath tongue - looks like she bit it Neck: supple. 7-8 Carotids 2+ bilat; no bruits. No lymphadenopathy or thryomegaly appreciated. Cor: PMI nondisplaced. Regular rate & rhythm. + s3 Lungs: clear Abdomen: soft, nontender, nondistended. No hepatosplenomegaly. No bruits or masses. Good bowel sounds. Extremities: no cyanosis, clubbing, rash, edema + Foley  Neuro: alert & orientedx3, cranial nerves grossly intact. moves all 4 extremities w/o difficulty. Affect pleasant    Telemetry: Sinus 80-100. Personally reviewed   Labs: Basic Metabolic Panel: Recent Labs  Lab 07/06/18 0733 07/06/18 2340 07/07/18 0254 07/08/18 0342 07/09/18 0619 07/10/18 0321  NA 130* 134* 134* 131* 130* 127*  K 4.4 3.4* 3.4* 4.1 3.8 4.6  CL 95* 97* 98 93* 86* 84*  CO2 24 26 26 28  32 34*  GLUCOSE 184* 163* 106* 107* 92 108*  BUN 41*  38* 37* 27* 20 17  CREATININE 2.39* 1.56* 1.50* 1.17* 1.05* 1.02*  CALCIUM 8.9 8.8* 8.9 9.4 9.2 9.1  MG 2.5*  --  2.0 1.9 1.7 2.5*  PHOS 5.3*  --  3.8 2.2* 2.0* 1.9*    Liver Function Tests: Recent Labs  Lab 07/06/18 0733 07/07/18 0254 07/08/18 0342 07/09/18 0619  AST 42* 45* 59* 72*  ALT 19 19 22 25   ALKPHOS 75 73 77 76  BILITOT 2.8* 3.5* 4.0* 3.8*  PROT 6.6 6.6 6.8 6.6  ALBUMIN 3.2* 3.1* 3.2* 2.9*   No results for input(s): LIPASE, AMYLASE in the last 168 hours. No results for input(s): AMMONIA in the last 168 hours.  CBC: Recent Labs  Lab 07/04/18 0918 07/05/18 0319  07/05/18 2358 07/07/18 0254 07/08/18 0342 07/09/18 1720 07/10/18 0321  WBC 4.8 7.0  --   --  7.5 7.3 6.6 6.7  NEUTROABS 2.3  --   --   --   --   --   --  3.7  HGB 11.6* 12.5   < > 16.0* 10.8* 11.5* 13.0 12.9  HCT 34.9* 36.8   < > 47.0* 33.2* 34.2* 38.6 37.7  MCV 71.2* 70.4*  --   --  71.7* 70.4* 68.9* 68.2*  PLT 241 254  --   --  295 254 321 328   < > = values in this interval not displayed.    Cardiac Enzymes: Recent Labs  Lab 07/04/18  0018 07/04/18 0918 07/05/18 0704 07/05/18 1007 07/05/18 1543 07/06/18 0733  CKTOTAL  --   --   --   --   --  140  CKMB  --   --   --   --   --  2.9  TROPONINI 0.06* 0.06* 0.08* 0.08* 0.08*  --     BNP: BNP (last 3 results) Recent Labs    04/16/18 0050 05/21/18 1451 07/03/18 0503  BNP 1,857.5* 1,580.9* 2,450.2*    ProBNP (last 3 results) No results for input(s): PROBNP in the last 8760 hours.    Other results:  Imaging: Dg Chest 2 View  Result Date: 07/09/2018 CLINICAL DATA:  Pneumonia. EXAM: CHEST - 2 VIEW COMPARISON:  07/06/2018. FINDINGS: Enlarged cardiac silhouette is stable. PICC line tip cavoatrial junction. ET tube removed. Improved retrocardiac density, some clearing of atelectasis, and effusion. No pneumothorax. IMPRESSION: Improved aeration. Electronically Signed   By: Staci Righter M.D.   On: 07/09/2018 09:42     Medications:      Scheduled Medications: . aspirin  81 mg Oral Daily  . atorvastatin  40 mg Oral q1800  . budesonide (PULMICORT) nebulizer solution  0.5 mg Nebulization BID  . Chlorhexidine Gluconate Cloth  6 each Topical Daily  . Chlorhexidine Gluconate Cloth  6 each Topical Daily  . digoxin  0.125 mg Oral Daily  . feeding supplement (ENSURE ENLIVE)  237 mL Oral BID BM  . folic acid  1 mg Oral Daily  . furosemide  80 mg Intravenous TID  . ipratropium-albuterol  3 mL Nebulization BID  . losartan  25 mg Oral Daily  . mouth rinse  15 mL Mouth Rinse BID  . multivitamin with minerals  1 tablet Oral Daily  . pantoprazole  40 mg Oral Daily  . potassium chloride  40 mEq Oral BID  . sodium chloride flush  10-40 mL Intracatheter Q12H  . sodium chloride flush  3 mL Intravenous Q12H  . spironolactone  25 mg Oral Daily  . thiamine  100 mg Oral Daily    Infusions: . sodium chloride 10 mL/hr at 07/10/18 0700  . amiodarone 30 mg/hr (07/10/18 0700)  . ceFEPime (MAXIPIME) IV Stopped (07/09/18 2155)  . heparin 900 Units/hr (07/10/18 0700)  . milrinone 0.125 mcg/kg/min (07/10/18 0700)    PRN Medications: sodium chloride, acetaminophen, albuterol, hydrALAZINE, ondansetron (ZOFRAN) IV, phenol, senna-docusate, sodium chloride flush, sodium chloride flush, sorbitol, traMADol   Assessment/Plan:    1. Acute on chronic systolic HF -> cardiogenic shock - Longstanding EF 25-30%. Suspect NICM but hasn't agreed to ischemic w/u to date - Echo 07/05/18 EF 15% RV moderately to severely down Personally reviewed - Suspect due to polysubstance abuse +/- tachy-induced. - Tenuous.On milrinone 0.125. Co-ox remains low at 51%.  - Diuresing very well on IV lasix. - CVP 7-8 Continue diuresis with one more dose of IV lasix then switch to po  - Continue sprio and dig - Increase losartan 25 bid - No b-blocker yet due to shock - R/L cath later today  2. Cardiac arrestVFF/TdP  - s/p Code Blue 6/19 with defib x 1 - rhythm  stable - Keep K> 4.0 Mg > 2.0  3. Acute hypoxic respiratory failure - extubated 6/20 - improved with diuresis  - treating for PNA - continue cefipime. Stop date set for 6/25   4. AKI, probable typical - due to HF/cardiorenal/arrest - continue hemodynamic support - creatinine improving 2.5 -> 1.5 -> 1.2 -> 1.0  5. AFL - now  back in SR - Continue IV amio until milrinone off - Hold heparin for 24 hours with oral bleeding and hematuria. Restart tomorrow at lower rate Discussed dosing with PharmD personally.  6. Polysubstance abuse - longstanding. Will be major impediment to care  7. Hypokalemia/Hypomagnesemia - K 3.8 Mag 1.8. will  supp. Continue spiro  8. Acute bronchitis with purulent sputum - continue cefipime. Stop date set for 6/25    CRITICAL CARE Performed by: Glori Bickers  Total critical care time: 35 minutes  Critical care time was exclusive of separately billable procedures and treating other patients.  Critical care was necessary to treat or prevent imminent or life-threatening deterioration.  Critical care was time spent personally by me (independent of midlevel providers or residents) on the following activities: development of treatment plan with patient and/or surrogate as well as nursing, discussions with consultants, evaluation of patient's response to treatment, examination of patient, obtaining history from patient or surrogate, ordering and performing treatments and interventions, ordering and review of laboratory studies, ordering and review of radiographic studies, pulse oximetry and re-evaluation of patient's condition.    Length of Stay: 7   Glori Bickers MD 07/10/2018, 8:53 AM  Advanced Heart Failure Team Pager [phone] (M-F; [place])  Please contact [facility] for night-coverage after hours (4p -7a ) and weekends on [email or web address]

## 2018-07-10 NOTE — Progress Notes (Addendum)
ANTICOAGULATION CONSULT NOTE   Pharmacy Consult for Heparin Indication: atrial fibrillation   Patient Measurements: Height: 5\' 5"  (165.1 cm) Weight: 113 lb 5.1 oz (51.4 kg) IBW/kg (Calculated) : 57  Heparin dosing weight: 57.6  Vital Signs: Temp: 97.3 F (36.3 C) (06/23 0808) Temp Source: Axillary (06/23 0808) BP: 120/101 (06/23 0500) Pulse Rate: 92 (06/23 0500)  Labs: Recent Labs    07/08/18 0342 07/09/18 0619 07/09/18 1634 07/09/18 1720 07/10/18 0321  HGB 11.5*  --   --  13.0 12.9  HCT 34.2*  --   --  38.6 37.7  PLT 254  --   --  321 328  HEPARINUNFRC 0.46 0.20* 0.44  --  0.32  CREATININE 1.17* 1.05*  --   --  1.02*    Estimated Creatinine Clearance: 53.5 mL/min (A) (by C-G formula based on SCr of 1.02 mg/dL (H)).   Assessment: 48 yr old female c/o SOB x2d now associated w/ chest tightness, found to be in Afib. Patient is in the ICU s/p torsades / VF episode. Pharmacy dosing heparin.  Heparin level therapeutic this morning at 0.44.  Patient complaining of mouth bleed and pinkish urine.  Will stop heparin for now.     Goal of Therapy:  Monitor platelets by anticoagulation protocol: Yes  Heparin level = 0.3 to 0.7 units/ml   Plan:  Hold heparin for 12 hours per Dr. Haroldine Laws Daily CBC/HL Continue to monitor for signs/symptoms of bleeding  Thank you for the interesting consult and for involving pharmacy in this patient's care.  Tamela Gammon, PharmD 07/10/2018 9:14 AM PGY-1 Pharmacy Resident Direct Phone: [phone] Please check [email or web address] for unit-specific pharmacist phone numbers

## 2018-07-11 ENCOUNTER — Encounter (HOSPITAL_COMMUNITY): Payer: Self-pay | Admitting: Internal Medicine

## 2018-07-11 DIAGNOSIS — E440 Moderate protein-calorie malnutrition: Secondary | ICD-10-CM

## 2018-07-11 DIAGNOSIS — I4901 Ventricular fibrillation: Secondary | ICD-10-CM

## 2018-07-11 DIAGNOSIS — Z515 Encounter for palliative care: Secondary | ICD-10-CM

## 2018-07-11 DIAGNOSIS — N179 Acute kidney failure, unspecified: Secondary | ICD-10-CM

## 2018-07-11 DIAGNOSIS — I255 Ischemic cardiomyopathy: Secondary | ICD-10-CM

## 2018-07-11 DIAGNOSIS — K7200 Acute and subacute hepatic failure without coma: Secondary | ICD-10-CM

## 2018-07-11 DIAGNOSIS — I429 Cardiomyopathy, unspecified: Secondary | ICD-10-CM

## 2018-07-11 DIAGNOSIS — Z7189 Other specified counseling: Secondary | ICD-10-CM

## 2018-07-11 LAB — CBC WITH DIFFERENTIAL/PLATELET
Abs Immature Granulocytes: 0.05 10*3/uL (ref 0.00–0.07)
Basophils Absolute: 0 10*3/uL (ref 0.0–0.1)
Basophils Relative: 0 %
Eosinophils Absolute: 0.2 10*3/uL (ref 0.0–0.5)
Eosinophils Relative: 2 %
HCT: 37.7 % (ref 36.0–46.0)
Hemoglobin: 12.5 g/dL (ref 12.0–15.0)
Immature Granulocytes: 1 %
Lymphocytes Relative: 22 %
Lymphs Abs: 1.6 10*3/uL (ref 0.7–4.0)
MCH: 23.1 pg — ABNORMAL LOW (ref 26.0–34.0)
MCHC: 33.2 g/dL (ref 30.0–36.0)
MCV: 69.6 fL — ABNORMAL LOW (ref 80.0–100.0)
Monocytes Absolute: 1.2 10*3/uL — ABNORMAL HIGH (ref 0.1–1.0)
Monocytes Relative: 16 %
Neutro Abs: 4.2 10*3/uL (ref 1.7–7.7)
Neutrophils Relative %: 59 %
Platelets: 317 10*3/uL (ref 150–400)
RBC: 5.42 MIL/uL — ABNORMAL HIGH (ref 3.87–5.11)
RDW: 25.2 % — ABNORMAL HIGH (ref 11.5–15.5)
WBC: 7.3 10*3/uL (ref 4.0–10.5)
nRBC: 0 % (ref 0.0–0.2)

## 2018-07-11 LAB — COMPREHENSIVE METABOLIC PANEL
ALT: 27 U/L (ref 0–44)
AST: 55 U/L — ABNORMAL HIGH (ref 15–41)
Albumin: 2.9 g/dL — ABNORMAL LOW (ref 3.5–5.0)
Alkaline Phosphatase: 80 U/L (ref 38–126)
Anion gap: 8 (ref 5–15)
BUN: 17 mg/dL (ref 6–20)
CO2: 31 mmol/L (ref 22–32)
Calcium: 9.1 mg/dL (ref 8.9–10.3)
Chloride: 89 mmol/L — ABNORMAL LOW (ref 98–111)
Creatinine, Ser: 1.08 mg/dL — ABNORMAL HIGH (ref 0.44–1.00)
GFR calc Af Amer: >60 mL/min (ref >60)
GFR calc non Af Amer: 60 mL/min — ABNORMAL LOW (ref >60)
Glucose, Bld: 121 mg/dL — ABNORMAL HIGH (ref 70–99)
Potassium: 4.9 mmol/L (ref 3.5–5.1)
Sodium: 128 mmol/L — ABNORMAL LOW (ref 135–145)
Total Bilirubin: 3 mg/dL — ABNORMAL HIGH (ref 0.3–1.2)
Total Protein: 6.7 g/dL (ref 6.5–8.1)

## 2018-07-11 LAB — HEPATITIS PANEL, ACUTE
HCV Ab: 0.1 s/co ratio (ref 0.0–0.9)
Hep A IgM: NEGATIVE
Hep B C IgM: NEGATIVE
Hepatitis B Surface Ag: NEGATIVE

## 2018-07-11 LAB — COOXEMETRY PANEL
Carboxyhemoglobin: 1.6 % — ABNORMAL HIGH (ref 0.5–1.5)
Methemoglobin: 0.6 % (ref 0.0–1.5)
O2 Saturation: 55.9 %
Total hemoglobin: 13 g/dL (ref 12.0–16.0)

## 2018-07-11 LAB — HIV ANTIBODY (ROUTINE TESTING W REFLEX): HIV Screen 4th Generation wRfx: NONREACTIVE

## 2018-07-11 MED ORDER — HEPARIN (PORCINE) 25000 UT/250ML-% IV SOLN: 700.0000 [IU]/h | INTRAVENOUS | Status: DC

## 2018-07-11 MED ORDER — AMIODARONE HCL 200 MG PO TABS
200.0000 mg | ORAL_TABLET | Freq: Two times a day (BID) | ORAL | Status: DC
  Administered 2018-07-11 – 2018-07-12 (×3): 200 mg via ORAL
  Filled 2018-07-11 (×3): qty 1

## 2018-07-11 MED ORDER — ENOXAPARIN SODIUM 40 MG/0.4ML ~~LOC~~ SOLN
40.0000 mg | SUBCUTANEOUS | Status: DC
  Administered 2018-07-11 – 2018-07-12 (×2): 40 mg via SUBCUTANEOUS
  Filled 2018-07-11 (×2): qty 0.4

## 2018-07-11 MED ORDER — MEGESTROL ACETATE 400 MG/10ML PO SUSP
400.0000 mg | Freq: Two times a day (BID) | ORAL | Status: DC
  Administered 2018-07-11 – 2018-07-12 (×3): 400 mg via ORAL
  Filled 2018-07-11 (×3): qty 10

## 2018-07-11 NOTE — Progress Notes (Addendum)
PROGRESS NOTE    Sabrina Holland  WER:[id number] DOB: 1971-01-10 DOA: 07/03/2018 PCP: Gildardo Pounds, NP   Brief Narrative:  48 y.o. BF PMHx  TIA; HTN; acute on chronic systolic and diastolic CHF, cardiomyopathy, torsades de pointes S COPD, chronic respiratory failure with hypoxia; breast cancer s/p mastectomy; and Polysubstance abuse (cocaine)  Presenting with SOB.  She knows she has CHF and COPD.  She was doing cocaine in the past and selling it recently - she reports she has not personally used in it in the last 3 months but might have contact by selling it.   For the last 3 days, severely SOB.  Neck pain.  Her balance was off.  Easy bruising.  Significant CP and B rib pain from breathing so hard.  She took Lasix with minimal UOP.  No weight gain.  +edema from knees to her thighs.  No appetite.  She feels better, 5/10; was "15/10" on arrival.  On Dilt 10 with HR 140s.  Breathing better now, things are not blurry and spinning.  She is feeling like she is hungry.   She is doing rehab and AA, one relapse about 60 days ago.  Her brother reports that "it looks like she been doing it" - still using cocaine.     Subjective: A/O x4, negative S OB, negative CP, negative abdominal pain.  States she is going to discontinue marijuana and cocaine use.  Requests something for appetite stimulation.  In addition painful sore under her tongue secondary to intubation.   Assessment & Plan:   Principal Problem:   Atrial flutter (HCC) Active Problems:   History of breast cancer   TIA (transient ischemic attack)   Essential hypertension   Polysubstance abuse (HCC)   Chronic combined systolic and diastolic heart failure (HCC)   COPD (chronic obstructive pulmonary disease) (HCC)   Cardiomyopathy (HCC)   Acute on chronic respiratory failure with hypoxemia (HCC)   COPD exacerbation (HCC)   Cocaine abuse ([place])   Acute on chronic systolic and diastolic heart failure, NYHA class 3 (HCC)   Acute  respiratory failure (HCC)   Endotracheally intubated   Torsades de pointes ([place])   Cardiac arrest ([place])   Cardiogenic shock secondary to V. fib arrest - Strict in and out -Daily weight Longstanding EF 25-30%. Suspect NICM but hasn't agreed to ischemic w/u to date - Echo 07/05/18 EF 15% RV moderately to severely downPersonally reviewed - Suspect due to polysubstance abuse +/- tachy-induced. - Cath 6/23 normal cors.  - Remains tenuous.On milrinone 0.125. Co-ox 56. Will stop today and follow.  - Volume status low. Continue to hold diuretics at least one more day.   - Continue sprio and dig - Continue losartan 25 bid. BP too low to titrate - No b-blocker yet due to shock - Not candidate for home inotropes or advanced therapies due to ongoing drug use  Ischemic cardiomyopathy/acute on chronic systolic and diastolic CHF secondary to cocaine abuse -Counseled extensively on sequela of continuing to use cocaine to include increasing heart failure, stroke, DEATH. - Patient states she she plans on not using any future cocaine. - See cardiogenic shock  Pneumonia? -Extubated 6/20 -Complete course antibiotics -Currently asymptomatic  Shock liver -Mildly elevated will recheck in 48 hours -Acute hepatitis panel and HIV panel pending  Acute kidney injury - Monitor closely  Polysubstance abuse - On admission patient positive for cocaine and marijuana - Patient counseled at length concerning sequela of continuing to use cocaine and marijuana to  include DEATH - Patient states absolutely is going to discontinue use of both substances.  Malnutrition -6/24 start Megace  Goals of care - 6/24 palliative care consult placed discuss making patient DNR, given her continued polysubstance abuse.  Patient is agreed to speak with palliative care physician - 6/24 social work consult placed;1.  Current substance abuse 2.  Patient will require help with medication    DVT prophylaxis: SCD Code  Status: Full Family Communication: None Disposition Plan: TBD   Consultants:  CHF team Palliative care   Procedures/Significant Events:  6/18 echocardiogram:  LVEF =15%  Left ventricular diffuse hypokinesis. -Right Ventricle: moderately reduced systolic function. systolic pressure is mildly elevated with an estimated pressure of 24.8 mmHg. -Left Atrium: severely dilated. -Right Atrium: severely dilated.  Mitral Valve: regurgitation is moderate by color flow Doppler. Tricuspid Valve: regurgitation is severe by color flow Doppler. 6/23 cardiac catheterization :-Normal coronary arteries -Severe NICM EF 10-15% -Well compensated hemodynamics on milrinone 0.125       I have personally reviewed and interpreted all radiology studies and my findings are as above.  VENTILATOR SETTINGS:    Cultures   Antimicrobials: Anti-infectives (From admission, onward)   Start     Stop   07/07/18 1030  ceFEPIme (MAXIPIME) 2 g in sodium chloride 0.9 % 100 mL IVPB     07/11/18 2135       Devices    LINES / TUBES:      Continuous Infusions: . sodium chloride 10 mL/hr at 07/10/18 1100  . amiodarone 30 mg/hr (07/10/18 2255)  . ceFEPime (MAXIPIME) IV 2 g (07/10/18 2252)  . milrinone 0.125 mcg/kg/min (07/10/18 1222)     Objective: Vitals:   07/11/18 0500 07/11/18 0600 07/11/18 0620 07/11/18 0711  BP: 120/65 106/75    Pulse: 89 89    Resp: (!) 28 19    Temp:    (!) 97.5 F (36.4 C)  TempSrc:    Oral  SpO2: 99% 97%    Weight:   52.5 kg   Height:        Intake/Output Summary (Last 24 hours) at 07/11/2018 0347 Last data filed at 07/11/2018 0500 Gross per 24 hour  Intake 1310.96 ml  Output 1885 ml  Net -574.04 ml   Filed Weights   07/09/18 0900 07/10/18 0545 07/11/18 0620  Weight: 55.4 kg 51.4 kg 52.5 kg    Examination:  General: A/O x4, no acute respiratory distress cachectic Eyes: negative scleral hemorrhage, negative anisocoria, negative icterus ENT: Negative  Runny nose, negative gingival bleeding, poor dentation, large sore underneath tongue not bleeding but most likely secondary to intubation. Neck:  Negative scars, masses, torticollis, lymphadenopathy, JVD Lungs: Clear to auscultation bilaterally without wheezes or crackles Cardiovascular: Regular rate and rhythm without murmur gallop or rub normal S1 and S2 Abdomen: negative abdominal pain, nondistended, positive soft, bowel sounds, no rebound, no ascites, no appreciable mass Extremities: No significant cyanosis, clubbing, or edema bilateral lower extremities Skin: Negative rashes, lesions, ulcers Psychiatric:  Negative depression, negative anxiety, negative fatigue, negative mania  Central nervous system:  Cranial nerves II through XII intact, tongue/uvula midline, all extremities muscle strength 5/5, sensation intact throughout,  negative dysarthria, negative expressive aphasia, negative receptive aphasia.  .     Data Reviewed: Care during the described time interval was provided by me .  I have reviewed this patient's available data, including medical history, events of note, physical examination, and all test results as part of my evaluation.   CBC: Recent Labs  Lab 07/04/18 0918  07/07/18 0254 07/08/18 0342 07/09/18 1720 07/10/18 0321 07/10/18 1408 07/10/18 1412 07/11/18 0453  WBC 4.8   < > 7.5 7.3 6.6 6.7  --   --  7.3  NEUTROABS 2.3  --   --   --   --  3.7  --   --  4.2  HGB 11.6*   < > 10.8* 11.5* 13.0 12.9 15.3*  16.0* 14.6 12.5  HCT 34.9*   < > 33.2* 34.2* 38.6 37.7 45.0  47.0* 43.0 37.7  MCV 71.2*   < > 71.7* 70.4* 68.9* 68.2*  --   --  69.6*  PLT 241   < > 295 254 321 328  --   --  317   < > = values in this interval not displayed.   Basic Metabolic Panel: Recent Labs  Lab 07/06/18 0733  07/07/18 0254 07/08/18 0342 07/09/18 0619 07/10/18 0321 07/10/18 1408 07/10/18 1412 07/11/18 0453  NA 130*   < > 134* 131* 130* 127* 132*  129* 132* 128*  K 4.4   < > 3.4*  4.1 3.8 4.6 4.2  4.5 3.9 4.9  CL 95*   < > 98 93* 86* 84*  --   --  89*  CO2 24   < > 26 28 32 34*  --   --  31  GLUCOSE 184*   < > 106* 107* 92 108*  --   --  121*  BUN 41*   < > 37* 27* 20 17  --   --  17  CREATININE 2.39*   < > 1.50* 1.17* 1.05* 1.02*  --   --  1.08*  CALCIUM 8.9   < > 8.9 9.4 9.2 9.1  --   --  9.1  MG 2.5*  --  2.0 1.9 1.7 2.5*  --   --   --   PHOS 5.3*  --  3.8 2.2* 2.0* 1.9*  --   --   --    < > = values in this interval not displayed.   GFR: Estimated Creatinine Clearance: 51.6 mL/min (A) (by C-G formula based on SCr of 1.08 mg/dL (H)). Liver Function Tests: Recent Labs  Lab 07/06/18 0733 07/07/18 0254 07/08/18 0342 07/09/18 0619 07/11/18 0453  AST 42* 45* 59* 72* 55*  ALT 19 19 22 25 27   ALKPHOS 75 73 77 76 80  BILITOT 2.8* 3.5* 4.0* 3.8* 3.0*  PROT 6.6 6.6 6.8 6.6 6.7  ALBUMIN 3.2* 3.1* 3.2* 2.9* 2.9*   No results for input(s): LIPASE, AMYLASE in the last 168 hours. No results for input(s): AMMONIA in the last 168 hours. Coagulation Profile: No results for input(s): INR, PROTIME in the last 168 hours. Cardiac Enzymes: Recent Labs  Lab 07/04/18 0918 07/05/18 0704 07/05/18 1007 07/05/18 1543 07/06/18 0733  CKTOTAL  --   --   --   --  140  CKMB  --   --   --   --  2.9  TROPONINI 0.06* 0.08* 0.08* 0.08*  --    BNP (last 3 results) No results for input(s): PROBNP in the last 8760 hours. HbA1C: No results for input(s): HGBA1C in the last 72 hours. CBG: No results for input(s): GLUCAP in the last 168 hours. Lipid Profile: No results for input(s): CHOL, HDL, LDLCALC, TRIG, CHOLHDL, LDLDIRECT in the last 72 hours. Thyroid Function Tests: No results for input(s): TSH, T4TOTAL, FREET4, T3FREE, THYROIDAB in the last 72 hours. Anemia Panel: No results for  input(s): VITAMINB12, FOLATE, FERRITIN, TIBC, IRON, RETICCTPCT in the last 72 hours. Urine analysis:    Component Value Date/Time   COLORURINE STRAW (A) 02/26/2018 1032   APPEARANCEUR CLEAR  02/26/2018 1032   LABSPEC 1.005 02/26/2018 1032   LABSPEC 1.015 11/29/2007 1119   PHURINE 5.0 02/26/2018 1032   GLUCOSEU NEGATIVE 02/26/2018 1032   HGBUR NEGATIVE 02/26/2018 1032   BILIRUBINUR NEGATIVE 02/26/2018 1032   BILIRUBINUR negative 09/04/2017 1217   BILIRUBINUR Negative 11/29/2007 1119   KETONESUR NEGATIVE 02/26/2018 1032   PROTEINUR NEGATIVE 02/26/2018 1032   UROBILINOGEN 0.2 09/04/2017 1217   UROBILINOGEN 0.2 03/02/2009 0856   NITRITE NEGATIVE 02/26/2018 1032   LEUKOCYTESUR SMALL (A) 02/26/2018 1032   LEUKOCYTESUR Negative 11/29/2007 1119   Sepsis Labs: @LABRCNTIP (procalcitonin:4,lacticidven:4)  ) Recent Results (from the past 240 hour(s))  Novel Coronavirus,NAA,(SEND-OUT TO REF LAB - TAT 24-48 hrs); Hosp Order     Status: None   Collection Time: 07/03/18  5:58 AM   Specimen: Nasopharyngeal Swab; Respiratory  Result Value Ref Range Status   SARS-CoV-2, NAA NOT DETECTED NOT DETECTED Final    Comment: (NOTE) This test was developed and its performance characteristics determined by [employer]. This test has not been FDA cleared or approved. This test has been authorized by FDA under an Emergency Use Authorization (EUA). This test is only authorized for the duration of time the declaration that circumstances exist justifying the authorization of the emergency use of in vitro diagnostic tests for detection of SARS-CoV-2 virus and/or diagnosis of COVID-19 infection under section 564(b)(1) of the Act, 21 U.S.C. 009FGH-8(E)(9), unless the authorization is terminated or revoked sooner. When diagnostic testing is negative, the possibility of a false negative result should be considered in the context of a patient's recent exposures and the presence of clinical signs and symptoms consistent with COVID-19. An individual without symptoms of COVID-19 and who is not shedding SARS-CoV-2 virus would expect to have a negative (not detected) result in this assay.  Performed  At: [facility] [address] [place], [place] [id number] Rush Farmer MD LF:[id number]    [place]  Final    Comment: Performed at [facility], [place] [address], [place], [place] [address]  Expectorated sputum assessment w rflx to resp cult     Status: None   Collection Time: 07/07/18  4:11 PM   Specimen: Sputum  Result Value Ref Range Status   Specimen Description SPU  Final   Special Requests NONE  Final   Sputum evaluation   Final    THIS SPECIMEN IS ACCEPTABLE FOR SPUTUM CULTURE Performed at [facility], [address] [place], [place] [address]    Report Status 07/07/2018 FINAL  Final  Culture, respiratory     Status: None   Collection Time: 07/07/18  4:11 PM   Specimen: Sputum  Result Value Ref Range Status   Specimen Description SPU  Final   Special Requests NONE Reflexed from [id number]  Final   Gram Stain   Final    ABUNDANT WBC PRESENT, PREDOMINANTLY PMN RARE SQUAMOUS EPITHELIAL CELLS PRESENT RARE GRAM POSITIVE COCCI IN PAIRS RARE GRAM POSITIVE RODS    Culture   Final    Consistent with normal respiratory flora. Performed at [facility], [place] [address] [place],  [address]    Report Status 07/10/2018 FINAL  Final         Radiology Studies: Dg Chest 2 View  Result Date: 07/09/2018 CLINICAL DATA:  Pneumonia. EXAM:  CHEST - 2 VIEW COMPARISON:  07/06/2018. FINDINGS: Enlarged cardiac silhouette is stable. PICC line tip cavoatrial junction. ET tube removed. Improved retrocardiac density, some clearing of atelectasis, and effusion. No pneumothorax. IMPRESSION: Improved aeration. Electronically Signed   By: Staci Righter M.D.   On: 07/09/2018 09:42        Scheduled Meds: . aspirin  81 mg Oral Daily  . atorvastatin  40 mg Oral q1800  . budesonide (PULMICORT) nebulizer solution  0.5 mg Nebulization BID  . Chlorhexidine Gluconate Cloth  6 each Topical Daily  . Chlorhexidine Gluconate Cloth  6  each Topical Daily  . digoxin  0.125 mg Oral Daily  . feeding supplement (ENSURE ENLIVE)  237 mL Oral BID BM  . folic acid  1 mg Oral Daily  . ipratropium-albuterol  3 mL Nebulization BID  . losartan  25 mg Oral BID  . mouth rinse  15 mL Mouth Rinse BID  . multivitamin with minerals  1 tablet Oral Daily  . pantoprazole  40 mg Oral Daily  . potassium chloride  40 mEq Oral BID  . sodium chloride flush  10-40 mL Intracatheter Q12H  . sodium chloride flush  3 mL Intravenous Q12H  . spironolactone  25 mg Oral Daily  . thiamine  100 mg Oral Daily   Continuous Infusions: . sodium chloride 10 mL/hr at 07/10/18 1100  . amiodarone 30 mg/hr (07/10/18 2255)  . ceFEPime (MAXIPIME) IV 2 g (07/10/18 2252)  . milrinone 0.125 mcg/kg/min (07/10/18 1222)     LOS: 8 days   The patient is critically ill with multiple organ systems failure and requires high complexity decision making for assessment and support, frequent evaluation and titration of therapies, application of advanced monitoring technologies and extensive interpretation of multiple databases. Critical Care Time devoted to patient care services described in this note  Time spent: 40 minutes     Aaniya Sterba, Geraldo Docker, MD [facility] Pager [phone]  If 7PM-7AM, please contact night-coverage [email or web address] Password [facility] 07/11/2018, 7:21 AM

## 2018-07-11 NOTE — Progress Notes (Addendum)
ANTICOAGULATION CONSULT NOTE   Pharmacy Consult for Heparin Indication: atrial fibrillation   Patient Measurements: Height: 5\' 5"  (165.1 cm) Weight: 115 lb 11.9 oz (52.5 kg) IBW/kg (Calculated) : 57  Heparin dosing weight: 57.6  Vital Signs: Temp: 97.5 F (36.4 C) (06/24 0711) Temp Source: Oral (06/24 0711) BP: 106/75 (06/24 0600) Pulse Rate: 89 (06/24 0600)  Labs: Recent Labs    07/09/18 4034 07/09/18 1634  07/09/18 1720 07/10/18 0321 07/10/18 1408 07/10/18 1412 07/11/18 0453  HGB  --   --    < > 13.0 12.9 15.3*  16.0* 14.6 12.5  HCT  --   --    < > 38.6 37.7 45.0  47.0* 43.0 37.7  PLT  --   --   --  321 328  --   --  317  HEPARINUNFRC 0.20* 0.44  --   --  0.32  --   --   --   CREATININE 1.05*  --   --   --  1.02*  --   --  1.08*   < > = values in this interval not displayed.    Estimated Creatinine Clearance: 51.6 mL/min (A) (by C-G formula based on SCr of 1.08 mg/dL (H)).   Assessment: 48 yr old female c/o SOB x2d now associated w/ chest tightness, found to be in Afib. Patient s/p torsades / VF episode. Pharmacy dosing heparin.  Heparin held yesterday due to bleeding in mouth and blood noted in urine. Previously therapeutic on 900 units/hr (HL 0.32).    Goal of Therapy:  Monitor platelets by anticoagulation protocol: Yes  Heparin level = 0.3 to 0.7 units/ml   Plan:  Restart heparin at lower rate, 700 units/hr.  Daily CBC/HL Continue to monitor for signs/symptoms of bleeding  Thank you for the interesting consult and for involving pharmacy in this patient's care.  Claiborne Billings, PharmD PGY2 Cardiology Pharmacy Resident Please check AMION for all Pharmacist numbers by unit 07/11/2018 7:19 AM     Addendum: Pt reports continued sporadic mouth bleeding. Per Dr. Haroldine Laws, will continue to hold heparin and consider initiation of Eliquis tomorrow morning.  Claiborne Billings, PharmD PGY2 Cardiology Pharmacy Resident Please check AMION for all Pharmacist  numbers by unit 07/11/2018 10:21 AM

## 2018-07-11 NOTE — Progress Notes (Signed)
Advanced Heart Failure Rounding Note   Subjective:     Extubated on 6/2  Remains on milrinone 0.125. Off lasix after cath.  Co-ox 56%. Remains in NSR on IV amio. Off heparin due to oral bleeding.   Feel better. No SOB, orthopnea or PND.   Cath 6/23  Normal cors. EF 10-15% RA = 2 RV = 24/4 PA = 26/10 (17) PCW = 10 Fick cardiac output/index = 4.2/2.7 PVR = 1.7 WU FA sat = 100% PA sat = 71%, 73%  Objective:   Weight Range:  Vital Signs:   Temp:  [97.3 F (36.3 C)-98 F (36.7 C)] 97.5 F (36.4 C) (06/24 0711) Pulse Rate:  [0-132] 95 (06/24 0734) Resp:  [9-114] 18 (06/24 0734) BP: (88-132)/(57-102) 106/75 (06/24 0600) SpO2:  [0 %-100 %] 100 % (06/24 0734) Weight:  [52.5 kg] 52.5 kg (06/24 0620) Last BM Date: 07/10/18  Weight change: Filed Weights   07/09/18 0900 07/10/18 0545 07/11/18 0620  Weight: 55.4 kg 51.4 kg 52.5 kg    Intake/Output:   Intake/Output Summary (Last 24 hours) at 07/11/2018 0926 Last data filed at 07/11/2018 0855 Gross per 24 hour  Intake 1190.96 ml  Output 1700 ml  Net -509.04 ml     Physical Exam: General:  Well appearing. No resp difficulty HEENT: normal Neck: supple. no JVD. Carotids 2+ bilat; no bruits. No lymphadenopathy or thryomegaly appreciated. Cor: PMI laterally displaced. Regular rate & rhythm. No rubs, gallops or murmurs. Lungs: clear Abdomen: soft, nontender, nondistended. No hepatosplenomegaly. No bruits or masses. Good bowel sounds. Extremities: no cyanosis, clubbing, rash, edema Neuro: alert & orientedx3, cranial nerves grossly intact. moves all 4 extremities w/o difficulty. Affect pleasan   Telemetry: Sinus 90s. Personally reviewed   Labs: Basic Metabolic Panel: Recent Labs  Lab 07/06/18 0733  07/07/18 0254 07/08/18 0342 07/09/18 0619 07/10/18 0321 07/10/18 1408 07/10/18 1412 07/11/18 0453  NA 130*   < > 134* 131* 130* 127* 132*  129* 132* 128*  K 4.4   < > 3.4* 4.1 3.8 4.6 4.2  4.5 3.9 4.9  CL 95*    < > 98 93* 86* 84*  --   --  89*  CO2 24   < > 26 28 32 34*  --   --  31  GLUCOSE 184*   < > 106* 107* 92 108*  --   --  121*  BUN 41*   < > 37* 27* 20 17  --   --  17  CREATININE 2.39*   < > 1.50* 1.17* 1.05* 1.02*  --   --  1.08*  CALCIUM 8.9   < > 8.9 9.4 9.2 9.1  --   --  9.1  MG 2.5*  --  2.0 1.9 1.7 2.5*  --   --   --   PHOS 5.3*  --  3.8 2.2* 2.0* 1.9*  --   --   --    < > = values in this interval not displayed.    Liver Function Tests: Recent Labs  Lab 07/06/18 0733 07/07/18 0254 07/08/18 0342 07/09/18 0619 07/11/18 0453  AST 42* 45* 59* 72* 55*  ALT 19 19 22 25 27   ALKPHOS 75 73 77 76 80  BILITOT 2.8* 3.5* 4.0* 3.8* 3.0*  PROT 6.6 6.6 6.8 6.6 6.7  ALBUMIN 3.2* 3.1* 3.2* 2.9* 2.9*   No results for input(s): LIPASE, AMYLASE in the last 168 hours. No results for input(s): AMMONIA in the last 168 hours.  CBC: Recent Labs  Lab 07/07/18 0254 07/08/18 0342 07/09/18 1720 07/10/18 0321 07/10/18 1408 07/10/18 1412 07/11/18 0453  WBC 7.5 7.3 6.6 6.7  --   --  7.3  NEUTROABS  --   --   --  3.7  --   --  4.2  HGB 10.8* 11.5* 13.0 12.9 15.3*  16.0* 14.6 12.5  HCT 33.2* 34.2* 38.6 37.7 45.0  47.0* 43.0 37.7  MCV 71.7* 70.4* 68.9* 68.2*  --   --  69.6*  PLT 295 254 321 328  --   --  317    Cardiac Enzymes: Recent Labs  Lab 07/05/18 0704 07/05/18 1007 07/05/18 1543 07/06/18 0733  CKTOTAL  --   --   --  140  CKMB  --   --   --  2.9  TROPONINI 0.08* 0.08* 0.08*  --     BNP: BNP (last 3 results) Recent Labs    04/16/18 0050 05/21/18 1451 07/03/18 0503  BNP 1,857.5* 1,580.9* 2,450.2*    ProBNP (last 3 results) No results for input(s): PROBNP in the last 8760 hours.    Other results:  Imaging: Dg Chest 2 View  Result Date: 07/09/2018 CLINICAL DATA:  Pneumonia. EXAM: CHEST - 2 VIEW COMPARISON:  07/06/2018. FINDINGS: Enlarged cardiac silhouette is stable. PICC line tip cavoatrial junction. ET tube removed. Improved retrocardiac density, some  clearing of atelectasis, and effusion. No pneumothorax. IMPRESSION: Improved aeration. Electronically Signed   By: Staci Righter M.D.   On: 07/09/2018 09:42     Medications:     Scheduled Medications: . aspirin  81 mg Oral Daily  . atorvastatin  40 mg Oral q1800  . budesonide (PULMICORT) nebulizer solution  0.5 mg Nebulization BID  . Chlorhexidine Gluconate Cloth  6 each Topical Daily  . Chlorhexidine Gluconate Cloth  6 each Topical Daily  . digoxin  0.125 mg Oral Daily  . feeding supplement (ENSURE ENLIVE)  237 mL Oral BID BM  . folic acid  1 mg Oral Daily  . ipratropium-albuterol  3 mL Nebulization BID  . losartan  25 mg Oral BID  . mouth rinse  15 mL Mouth Rinse BID  . multivitamin with minerals  1 tablet Oral Daily  . pantoprazole  40 mg Oral Daily  . potassium chloride  40 mEq Oral BID  . sodium chloride flush  10-40 mL Intracatheter Q12H  . sodium chloride flush  3 mL Intravenous Q12H  . spironolactone  25 mg Oral Daily  . thiamine  100 mg Oral Daily    Infusions: . sodium chloride 10 mL/hr at 07/10/18 1100  . amiodarone 30 mg/hr (07/10/18 2255)  . ceFEPime (MAXIPIME) IV 2 g (07/11/18 0859)  . milrinone 0.125 mcg/kg/min (07/10/18 1222)    PRN Medications: sodium chloride, acetaminophen, albuterol, hydrALAZINE, ondansetron (ZOFRAN) IV, phenol, senna-docusate, sodium chloride flush, sodium chloride flush, sorbitol, traMADol   Assessment/Plan:    1. Acute on chronic systolic HF -> cardiogenic shock - Longstanding EF 25-30%. Suspect NICM but hasn't agreed to ischemic w/u to date - Echo 07/05/18 EF 15% RV moderately to severely down Personally reviewed - Suspect due to polysubstance abuse +/- tachy-induced. - Cath 6/23 normal cors.  - Remains tenuous.On milrinone 0.125. Co-ox 56. Will stop today and follow.  - Volume status low. Continue to hold diuretics at least one more day.   - Continue sprio and dig - Continue losartan 25 bid. BP too low to titrate - No  b-blocker yet due to shock - Not  candidate for home inotropes or advanced therapies due to ongoing drug use  2. Cardiac arrestVFF/TdP  - s/p Code Blue 6/19 with defib x 1 - rhythm stable - Keep K> 4.0 Mg > 2.0  3. Acute hypoxic respiratory failure - extubated 6/20 - improved with diuresis  - treating for PNA - continue cefipime. Stop date set for 6/25   4. AKI, probable typical - due to HF/cardiorenal/arrest - resolved  5. AFL - now back in SR - Continue IV amio until milrinone off - Hold heparin gain today.  If no further oral bleeding can switch to Eliquis on 6/25  6. Polysubstance abuse - longstanding. Will be major impediment to care  7. Hypokalemia/Hypomagnesemia - improved with supp - stop K supp now off IV lasix    8. Acute bronchitis with purulent sputum - continue cefipime. Stop date set for 6/25   Length of Stay: 8   Glori Bickers MD 07/11/2018, 9:26 AM  Advanced Heart Failure Team Pager [phone] (M-F; [place])  Please contact [facility] for night-coverage after hours (4p -7a ) and weekends on [email or web address]
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
## 2018-07-12 DIAGNOSIS — F1210 Cannabis abuse, uncomplicated: Secondary | ICD-10-CM

## 2018-07-12 DIAGNOSIS — F1410 Cocaine abuse, uncomplicated: Secondary | ICD-10-CM

## 2018-07-12 DIAGNOSIS — I5042 Chronic combined systolic (congestive) and diastolic (congestive) heart failure: Secondary | ICD-10-CM

## 2018-07-12 LAB — CBC WITH DIFFERENTIAL/PLATELET
Abs Immature Granulocytes: 0 10*3/uL (ref 0.00–0.07)
Basophils Absolute: 0.2 10*3/uL — ABNORMAL HIGH (ref 0.0–0.1)
Basophils Relative: 3 %
Eosinophils Absolute: 0.1 10*3/uL (ref 0.0–0.5)
Eosinophils Relative: 1 %
HCT: 39.2 % (ref 36.0–46.0)
Hemoglobin: 12.9 g/dL (ref 12.0–15.0)
Lymphocytes Relative: 24 %
Lymphs Abs: 1.8 10*3/uL (ref 0.7–4.0)
MCH: 23.2 pg — ABNORMAL LOW (ref 26.0–34.0)
MCHC: 32.9 g/dL (ref 30.0–36.0)
MCV: 70.6 fL — ABNORMAL LOW (ref 80.0–100.0)
Monocytes Absolute: 0.6 10*3/uL (ref 0.1–1.0)
Monocytes Relative: 8 %
Neutro Abs: 4.9 10*3/uL (ref 1.7–7.7)
Neutrophils Relative %: 64 %
Platelets: 408 10*3/uL — ABNORMAL HIGH (ref 150–400)
RBC: 5.55 MIL/uL — ABNORMAL HIGH (ref 3.87–5.11)
RDW: 25.2 % — ABNORMAL HIGH (ref 11.5–15.5)
WBC: 7.7 10*3/uL (ref 4.0–10.5)
nRBC: 0 % (ref 0.0–0.2)
nRBC: 2 /100 WBC — ABNORMAL HIGH

## 2018-07-12 LAB — BASIC METABOLIC PANEL
Anion gap: 7 (ref 5–15)
BUN: 14 mg/dL (ref 6–20)
CO2: 25 mmol/L (ref 22–32)
Calcium: 8.9 mg/dL (ref 8.9–10.3)
Chloride: 95 mmol/L — ABNORMAL LOW (ref 98–111)
Creatinine, Ser: 0.96 mg/dL (ref 0.44–1.00)
GFR calc Af Amer: >60 mL/min (ref >60)
GFR calc non Af Amer: >60 mL/min (ref >60)
Glucose, Bld: 111 mg/dL — ABNORMAL HIGH (ref 70–99)
Potassium: 4.7 mmol/L (ref 3.5–5.1)
Sodium: 127 mmol/L — ABNORMAL LOW (ref 135–145)

## 2018-07-12 LAB — COOXEMETRY PANEL
Carboxyhemoglobin: 1.8 % — ABNORMAL HIGH (ref 0.5–1.5)
Methemoglobin: 0.8 % (ref 0.0–1.5)
O2 Saturation: 65.1 %
Total hemoglobin: 13 g/dL (ref 12.0–16.0)

## 2018-07-12 LAB — MAGNESIUM: Magnesium: 2.2 mg/dL (ref 1.7–2.4)

## 2018-07-12 MED ORDER — FUROSEMIDE 20 MG PO TABS: 40.0000 mg | ORAL_TABLET | Freq: Every day | ORAL | 0 refills | Status: DC

## 2018-07-12 MED ORDER — PHENOL 1.4 % MT LIQD: 1.0000 | OROMUCOSAL | 0 refills | Status: DC | PRN

## 2018-07-12 MED ORDER — AMIODARONE HCL 200 MG PO TABS: 200.0000 mg | ORAL_TABLET | Freq: Two times a day (BID) | ORAL | 0 refills | Status: DC

## 2018-07-12 MED ORDER — SPIRONOLACTONE 25 MG PO TABS: 12.5000 mg | ORAL_TABLET | Freq: Every day | ORAL | 0 refills | Status: DC

## 2018-07-12 MED ORDER — THIAMINE HCL 100 MG PO TABS: 100.0000 mg | ORAL_TABLET | Freq: Every day | ORAL | 0 refills | Status: DC

## 2018-07-12 MED ORDER — ORAL CARE MOUTH RINSE: 15.0000 mL | Freq: Two times a day (BID) | OROMUCOSAL | 0 refills | Status: DC

## 2018-07-12 MED ORDER — MEGESTROL ACETATE 400 MG/10ML PO SUSP: 400.0000 mg | Freq: Two times a day (BID) | ORAL | 0 refills | Status: DC

## 2018-07-12 MED ORDER — FOLIC ACID 1 MG PO TABS: 1.0000 mg | ORAL_TABLET | Freq: Every day | ORAL | 0 refills | Status: DC

## 2018-07-12 MED ORDER — DIGOXIN 125 MCG PO TABS: 0.1250 mg | ORAL_TABLET | Freq: Every day | ORAL | 0 refills | Status: DC

## 2018-07-12 MED FILL — ?DIGITEK 125 MCG TABLET: 125 | 30 days supply | Qty: 30 | Fill #0

## 2018-07-12 MED FILL — ?FOLIC ACID 1MG TAB: 1 | 30 days supply | Qty: 30 | Fill #0

## 2018-07-12 MED FILL — MEGESTROL ACET 40 MG/ML SUS: 40 | 12 days supply | Qty: 240 | Fill #0

## 2018-07-12 MED FILL — ?AMIODARONE HCL 200 MG TAB: 200 | 30 days supply | Qty: 60 | Fill #0

## 2018-07-12 MED FILL — ?SPIRONOLACTON 25MG TABLET: 25 | 30 days supply | Qty: 15 | Fill #0

## 2018-07-12 MED FILL — ?FUROSEMIDE 20 MG TABLET: 20 | 30 days supply | Qty: 60 | Fill #0

## 2018-07-12 NOTE — Discharge Summary (Signed)
Physician Discharge Summary  Sabrina Holland OFB:[id number] DOB: 1970-03-11 DOA: 07/03/2018  PCP: Sabrina Pounds, NP  Admit date: 07/03/2018 Discharge date: 07/12/2018  Time spent: 30 minutes  Recommendations for Outpatient Follow-up:Cardiogenic shock secondary to V. fib arrest - Strict in and out -6.5 L -Daily weight  Filed Weights   07/10/18 0545 07/11/18 0620 07/12/18 0400  Weight: 51.4 kg 52.5 kg 52.7 kg   Longstanding EF 25-30%. Suspect NICM but hasn't agreed to ischemic w/u to date - Echo 07/05/18 EF 15% RV moderately to severely downPersonally reviewed - Suspect due to polysubstance abuse +/- tachy-induced. - Cath 6/23 normal cors. -D/c meds -Eliquis 5 mg twice daily -Amiodarone 200 mg twice daily -Lasix 40 mg daily -Spironolactone 12.5 mg daily -Losartan 50 mg daily -Digoxin 0.125 mg daily  -Negative beta-blocker secondary to drug abuse - Negative atorvastatin  Ischemic cardiomyopathy/acute on chronic systolic and diastolic CHF secondary to cocaine abuse -Counseled extensively on sequela of continuing to use cocaine to include increasing heart failure, stroke, DEATH. - Patient states she she plans on not using any future cocaine. - See cardiogenic shock  Pneumonia? -Extubated 6/20 -Completed course antibiotics -Currently asymptomatic  Shock liver -Mildly elevated on admission -Acute hepatitis panel and HIV panel pending  Acute kidney injury - Monitor closely  Polysubstance abuse - On admission patient positive for cocaine and marijuana - Patient counseled at length concerning sequela of continuing to use cocaine and marijuana to include DEATH - Patient states absolutely is going to discontinue use of both substances.  Malnutrition -6/24 start Megace  Goals of care - 6/24 palliative care consult placed discuss making patient DNR, given her continued polysubstance abuse.  Patient is agreed to speak with palliative care physician - 6/24  social work consult placed;1.  Current substance abuse 2.  Patient will require help with medication    Discharge Diagnoses:  Principal Problem:   Atrial flutter ([place]) Active Problems:   History of breast cancer   TIA (transient ischemic attack)   Essential hypertension   Polysubstance abuse (HCC)   Chronic combined systolic and diastolic heart failure (HCC)   COPD (chronic obstructive pulmonary disease) (HCC)   Cardiomyopathy (HCC)   Acute on chronic respiratory failure with hypoxemia (HCC)   COPD exacerbation (HCC)   Cocaine abuse ([place])   Acute on chronic systolic and diastolic heart failure, NYHA class 3 (HCC)   Acute respiratory failure (HCC)   Endotracheally intubated   Torsades de pointes ([place])   Cardiac arrest ([place])   Palliative care by specialist   Goals of care, counseling/discussion   Discharge Condition: Stable  Diet recommendation: Cardiac  Filed Weights   07/10/18 0545 07/11/18 0620 07/12/18 0400  Weight: 51.4 kg 52.5 kg 52.7 kg    History of present illness:  48 y.o.BF PMHx TIA; HTN; acute on chronic systolic and diastolic CHF, cardiomyopathy, torsades de pointes S COPD, chronic respiratory failure with hypoxia; breast cancer s/p mastectomy; and Polysubstance abuse (cocaine)  Presenting with SOB.She knows she has CHF and COPD. She was doing cocaine in the past and selling it recently - she reports she has not personally used in it in the last 3 months but might have contact by selling it. For the last 3 days, severely SOB. Neck pain. Her balance was off. Easy bruising. Significant CP and B rib pain from breathing so hard. She took Lasix with minimal UOP. No weight gain. +edema from knees to her thighs. No appetite. She feels better, 5/10; was "15/10" on arrival.  On Dilt 10 with HR 140s. Breathing better now, things are not blurry and spinning. She is feeling like she is hungry. She is doing rehab and AA, one relapse about 60 days ago. Her  brother reports that "it looks like she been doing it" - still using cocaine.   Hospital Course:  During his hospitalization patient treated for cardiogenic shock secondary to cocaine and marijuana use.  CHF team consulted patient's medications were adjusted now patient stabilized.  In addition social work and palliative care consulted secondary to patient's ongoing polysubstance abuse.  Patient stable for discharge.    Procedures: 6/18 echocardiogram: LVEF =15%  Left ventricular diffuse hypokinesis. -Right Ventricle: moderately reduced systolic function. systolic pressure is mildly elevated with an estimated pressure of 24.8 mmHg. -Left Atrium: severely dilated. -Right Atrium: severely dilated.  Mitral Valve: regurgitation is moderate by color flow Doppler. Tricuspid Valve: regurgitation is severe by color flow Doppler. 6/23 cardiac catheterization:-Normal coronary arteries -Severe NICM EF 10-15% -Well compensated hemodynamics on milrinone 0.125  Consultations: CHF team Palliative care   Cultures   6/20 sputum respiratory culture 6/23 acute hepatitis panel negative 6/23 HIV negative   Antibiotics Anti-infectives (From admission, onward)   Start     Stop   07/07/18 1030  ceFEPIme (MAXIPIME) 2 g in sodium chloride 0.9 % 100 mL IVPB     07/11/18 2135          Discharge Exam: Vitals:   07/11/18 2325 07/12/18 0400 07/12/18 0736 07/12/18 0829  BP: 122/78 130/85    Pulse:  85 93   Resp: 19 18 18    Temp: (!) 97.4 F (36.3 C) 97.8 F (36.6 C)  97.8 F (36.6 C)  TempSrc: Oral Oral  Oral  SpO2: 98% 100% 98%   Weight:  52.7 kg    Height:  5\' 5"  (1.651 m)      General: A/O x4, no acute respiratory distress cachectic Eyes: negative scleral hemorrhage, negative anisocoria, negative icterus ENT: Negative Runny nose, negative gingival bleeding, poor dentation, large sore underneath tongue not bleeding but most likely secondary to intubation. Neck:  Negative scars,  masses, torticollis, lymphadenopathy, JVD Lungs: Clear to auscultation bilaterally without wheezes or crackles Cardiovascular: Regular rate and rhythm without murmur gallop or rub normal S1 and S2    Discharge Instructions   Allergies as of 07/12/2018      Reactions   Penicillins Other (See Comments)   Pt had slight throat swelling Has patient had a PCN reaction causing immediate rash, facial/tongue/throat swelling, SOB or lightheadedness with hypotension: yes Has patient had a PCN reaction causing severe rash involving mucus membranes or skin necrosis: No Has patient had a PCN reaction that required hospitalization No Has patient had a PCN reaction occurring within the last 10 years: No If all of the above answers are "NO", then may proceed with Cephalosporin use.      Medication List    STOP taking these medications   albuterol (2.5 MG/3ML) 0.083% nebulizer solution Commonly known as: PROVENTIL   albuterol 108 (90 Base) MCG/ACT inhaler Commonly known as: Proventil HFA   amLODipine 5 MG tablet Commonly known as: NORVASC   atorvastatin 40 MG tablet Commonly known as: LIPITOR   cyclobenzaprine 10 MG tablet Commonly known as: FLEXERIL   lisinopril 20 MG tablet Commonly known as: ZESTRIL   metolazone 2.5 MG tablet Commonly known as: ZAROXOLYN   multivitamin with minerals Tabs tablet   omeprazole 20 MG capsule Commonly known as: PRILOSEC   senna-docusate 8.6-50  MG tablet Commonly known as: Senokot-S     TAKE these medications   amiodarone 200 MG tablet Commonly known as: PACERONE Take 1 tablet (200 mg total) by mouth 2 (two) times daily.   aspirin 81 MG EC tablet Take 1 tablet (81 mg total) by mouth daily.   budesonide-formoterol 80-4.5 MCG/ACT inhaler Commonly known as: Symbicort Inhale 2 puffs into the lungs 2 (two) times daily.   diclofenac sodium 1 % Gel Commonly known as: VOLTAREN Apply 2 g topically 4 (four) times daily.   digoxin 0.125 MG  tablet Commonly known as: LANOXIN Take 1 tablet (0.125 mg total) by mouth daily. Start taking on: July 13, 2018   ferrous sulfate 325 (65 FE) MG tablet Take 1 tablet (325 mg total) by mouth 2 (two) times daily with a meal.   fluticasone 50 MCG/ACT nasal spray Commonly known as: FLONASE Place 2 sprays into both nostrils daily.   folic acid 1 MG tablet Commonly known as: FOLVITE Take 1 tablet (1 mg total) by mouth daily. Start taking on: July 13, 2018   furosemide 20 MG tablet Commonly known as: LASIX Take 2 tablets (40 mg total) by mouth daily for 30 days. What changed:   how much to take  when to take this   lidocaine 5 % Commonly known as: Lidoderm Place 1 patch onto the skin daily. Remove & Discard patch within 12 hours or as directed by MD   megestrol 400 MG/10ML suspension Commonly known as: MEGACE Take 10 mLs (400 mg total) by mouth 2 (two) times daily.   mouth rinse Liqd solution 15 mLs by Mouth Rinse route 2 (two) times daily.   phenol 1.4 % Liqd Commonly known as: CHLORASEPTIC Use as directed 1 spray in the mouth or throat as needed for throat irritation / pain.   spironolactone 25 MG tablet Commonly known as: ALDACTONE Take 0.5 tablets (12.5 mg total) by mouth daily. What changed: how much to take   thiamine 100 MG tablet Take 1 tablet (100 mg total) by mouth daily. Start taking on: July 13, 2018   tiotropium 18 MCG inhalation capsule Commonly known as: Spiriva HandiHaler Place 1 capsule (18 mcg total) into inhaler and inhale daily.      Allergies  Allergen Reactions  . Penicillins Other (See Comments)    Pt had slight throat swelling Has patient had a PCN reaction causing immediate rash, facial/tongue/throat swelling, SOB or lightheadedness with hypotension: yes Has patient had a PCN reaction causing severe rash involving mucus membranes or skin necrosis: No Has patient had a PCN reaction that required hospitalization No Has patient had a PCN  reaction occurring within the last 10 years: No If all of the above answers are "NO", then may proceed with Cephalosporin use.       The results of significant diagnostics from this hospitalization (including imaging, microbiology, ancillary and laboratory) are listed below for reference.    Significant Diagnostic Studies: Dg Chest 2 View  Result Date: 07/09/2018 CLINICAL DATA:  Pneumonia. EXAM: CHEST - 2 VIEW COMPARISON:  07/06/2018. FINDINGS: Enlarged cardiac silhouette is stable. PICC line tip cavoatrial junction. ET tube removed. Improved retrocardiac density, some clearing of atelectasis, and effusion. No pneumothorax. IMPRESSION: Improved aeration. Electronically Signed   By: Staci Righter M.D.   On: 07/09/2018 09:42   US Renal  Result Date: 07/06/2018 CLINICAL DATA:  Anuria. EXAM: RENAL / URINARY TRACT ULTRASOUND COMPLETE COMPARISON:  KUB 07/05/2018.  Ultrasound 02/26/2018. FINDINGS: Right Kidney: Renal measurements: 10.9  x 4.3 x 5.8 cm = volume: 141.9 mL . Echogenicity within normal limits. No mass or hydronephrosis visualized. Left Kidney: Renal measurements: 10.0 x 5.6 x 5.0 cm = volume: 145.8 mL. Echogenicity within normal limits. No mass or hydronephrosis visualized. Bladder: Nondistended. Moderate ascites incidentally noted. IMPRESSION: 1. No acute renal abnormality identified. No hydronephrosis. Bladder is nondistended. 2.  Moderate ascites incidentally noted. Electronically Signed   By: Marcello Moores  Register   On: 07/06/2018 07:54   Dg Chest Port 1 View  Result Date: 07/06/2018 CLINICAL DATA:  Hypoxia. EXAM: PORTABLE CHEST 1 VIEW COMPARISON:  Radiograph yesterday. FINDINGS: Endotracheal tube tip at the thoracic inlet approximately 4.7 cm from the carina. Enteric tube in place with tip below the diaphragm not included in the field of view. Right upper extremity PICC in place with tip in the lower SVC. Cardiomegaly is unchanged. Left lung base opacity likely combination of pleural fluid  and airspace disease, unchanged. Slight increase in right lung base atelectasis. No frank pulmonary edema. No pneumothorax. IMPRESSION: 1. Endotracheal tube tip at the thoracic inlet 4.7 cm from the carina. Enteric tube in place. Right upper extremity PICC tip in the lower SVC. 2. Unchanged left lung base opacity likely combination of pleural fluid and airspace disease. Slight increase in right basilar atelectasis. Cardiomegaly is unchanged. Electronically Signed   By: Keith Rake M.D.   On: 07/06/2018 00:47   Dg Chest Port 1 View  Result Date: 07/05/2018 CLINICAL DATA:  Endotracheal tube adjustment.  Placement of OG tube. EXAM: PORTABLE CHEST 1 VIEW COMPARISON:  One-view chest x-ray 07/05/2018 at 4:16 a.m. FINDINGS: The endotracheal tube has been advanced and now terminates 5.2 cm above the carina. A new OG tube courses off the inferior border of the film. The heart is enlarged. Left pleural effusion and retrocardiac airspace opacity is stable. Minimal atelectasis at the right base is stable. Defibrillator pads are stable. IMPRESSION: 1. Endotracheal tube advanced 1 cm, now 5 cm above the carina. 2. New OG tube courses off the inferior border the film. 3. Stable cardiomegaly with retrocardiac opacities as previously described. Electronically Signed   By: San Morelle M.D.   On: 07/05/2018 05:41   Dg Chest Port 1 View  Result Date: 07/05/2018 CLINICAL DATA:  Status post intubation. EXAM: PORTABLE CHEST 1 VIEW COMPARISON:  One-view chest x-ray 07/03/2018. FINDINGS: Patient has been intubated. The endotracheal tube air terminates 6 cm above the carina, at the level clavicles. Defibrillator pads remain in place. The heart is enlarged. There is no edema or effusion. Retrocardiac opacification is stable. The lungs are otherwise clear. IMPRESSION: 1. Satisfactory positioning of the endotracheal tube, 6 cm above the carina. 2. Stable cardiomegaly without failure. Electronically Signed   By: San Morelle M.D.   On: 07/05/2018 04:52   Dg Chest Portable 1 View  Result Date: 07/03/2018 CLINICAL DATA:  Shortness of breath for 2 days. Chest tightness. Progressive symptoms. EXAM: PORTABLE CHEST 1 VIEW COMPARISON:  Two-view chest x-ray 05/21/2018 FINDINGS: Heart is enlarged. There is no edema or effusion. Defibrillator pads are in place. Heart size and pads obscure the retrocardiac space. No focal airspace disease is present. Mild curvature of the thoracic spine is stable. The visualized soft tissues and bony thorax are otherwise unremarkable. IMPRESSION: 1. Stable cardiomegaly without failure. 2. No acute cardiopulmonary disease. Electronically Signed   By: San Morelle M.D.   On: 07/03/2018 05:46   Dg Abd Portable 1v  Result Date: 07/05/2018 CLINICAL DATA:  Abdominal distension.  EXAM: PORTABLE ABDOMEN - 1 VIEW COMPARISON:  None. FINDINGS: The bowel gas pattern is normal. No radio-opaque calculi or other significant radiographic abnormality are seen. IMPRESSION: Negative exam. Electronically Signed   By: Inge Rise M.D.   On: 07/05/2018 07:28   Vas [place] Lower Extremity Venous (dvt)  Result Date: 07/06/2018  Lower Venous Study Indications: Edema.  Comparison Study: no prior Performing Technologist: Abram Sander RVS  Examination Guidelines: A complete evaluation includes B-mode imaging, spectral Doppler, color Doppler, and power Doppler as needed of all accessible portions of each vessel. Bilateral testing is considered an integral part of a complete examination. Limited examinations for reoccurring indications may be performed as noted.  +---------+---------------+---------+-----------+----------+-------+ RIGHT    CompressibilityPhasicitySpontaneityPropertiesSummary +---------+---------------+---------+-----------+----------+-------+ CFV      Full           Yes      Yes                          +---------+---------------+---------+-----------+----------+-------+ SFJ       Full                                                 +---------+---------------+---------+-----------+----------+-------+ FV Prox  Full                                                 +---------+---------------+---------+-----------+----------+-------+ FV Mid   Full                                                 +---------+---------------+---------+-----------+----------+-------+ FV DistalFull                                                 +---------+---------------+---------+-----------+----------+-------+ PFV      Full                                                 +---------+---------------+---------+-----------+----------+-------+ POP      Full           Yes      Yes                          +---------+---------------+---------+-----------+----------+-------+ PTV      Full                                                 +---------+---------------+---------+-----------+----------+-------+ PERO     Full                                                 +---------+---------------+---------+-----------+----------+-------+   +---------+---------------+---------+-----------+----------+-------+  LEFT     CompressibilityPhasicitySpontaneityPropertiesSummary +---------+---------------+---------+-----------+----------+-------+ CFV      Full           Yes      Yes                          +---------+---------------+---------+-----------+----------+-------+ SFJ      Full                                                 +---------+---------------+---------+-----------+----------+-------+ FV Prox  Full                                                 +---------+---------------+---------+-----------+----------+-------+ FV Mid   Full                                                 +---------+---------------+---------+-----------+----------+-------+ FV DistalFull                                                  +---------+---------------+---------+-----------+----------+-------+ PFV      Full                                                 +---------+---------------+---------+-----------+----------+-------+ POP      Full           Yes      Yes                          +---------+---------------+---------+-----------+----------+-------+ PTV      Full                                                 +---------+---------------+---------+-----------+----------+-------+ PERO     Full                                                 +---------+---------------+---------+-----------+----------+-------+    Findings reported to [place].  Summary: Right: There is no evidence of deep vein thrombosis in the lower extremity. No cystic structure found in the popliteal fossa. Left: There is no evidence of deep vein thrombosis in the lower extremity. No cystic structure found in the popliteal fossa.  *See table(s) above for measurements and observations. Electronically signed by Monica Martinez MD on 07/06/2018 at 4:57:01 PM.    Final    [place] Ekg Site Rite  Result Date: 07/05/2018 If Site Rite image not attached, placement could not be confirmed due to current cardiac rhythm.  US Abdomen Limited Ruq  Result Date: 07/06/2018 CLINICAL  DATA:  Ascites.  Increased bilirubin. EXAM: ULTRASOUND ABDOMEN LIMITED RIGHT UPPER QUADRANT COMPARISON:  None. FINDINGS: Gallbladder: No gallstones. Mild gallbladder wall thickening measuring 4.3 mm. No sonographic Murphy sign noted by sonographer. Common bile duct: Diameter: 5.7 mm Liver: No focal lesion identified. Within normal limits in parenchymal echogenicity. Portal vein is patent on color Doppler imaging with normal direction of blood flow towards the liver. Small right pleural effusion noted. IMPRESSION: No evidence of cholelithiasis. Mild gallbladder wall thickening which may be due to incomplete distension. Alternatively it may represent an acalculous cholecystitis.  Small right pleural effusion. Electronically Signed   By: Fidela Salisbury M.D.   On: 07/06/2018 21:47    Microbiology: Recent Results (from the past 240 hour(s))  Novel Coronavirus,NAA,(SEND-OUT TO REF LAB - TAT 24-48 hrs); Hosp Order     Status: None   Collection Time: 07/03/18  5:58 AM   Specimen: Nasopharyngeal Swab; Respiratory  Result Value Ref Range Status   SARS-CoV-2, NAA NOT DETECTED NOT DETECTED Final    Comment: (NOTE) This test was developed and its performance characteristics determined by [employer]. This test has not been FDA cleared or approved. This test has been authorized by FDA under an Emergency Use Authorization (EUA). This test is only authorized for the duration of time the declaration that circumstances exist justifying the authorization of the emergency use of in vitro diagnostic tests for detection of SARS-CoV-2 virus and/or diagnosis of COVID-19 infection under section 564(b)(1) of the Act, 21 U.S.C. 696EXB-2(W)(4), unless the authorization is terminated or revoked sooner. When diagnostic testing is negative, the possibility of a false negative result should be considered in the context of a patient's recent exposures and the presence of clinical signs and symptoms consistent with COVID-19. An individual without symptoms of COVID-19 and who is not shedding SARS-CoV-2 virus would expect to have a negative (not detected) result in this assay. Performed  At: [facility] [address] [place], [place] [id number] Rush Farmer MD VO:[id number]    [place]  Final    Comment: Performed at [facility], [place] [address], [place], [place] [address]  Expectorated sputum assessment w rflx to resp cult     Status: None   Collection Time: 07/07/18  4:11 PM   Specimen: Sputum  Result Value Ref Range Status   Specimen Description SPU  Final   Special Requests NONE  Final   Sputum evaluation   Final    THIS  SPECIMEN IS ACCEPTABLE FOR SPUTUM CULTURE Performed at [facility], [address] [place], [place] [address]    Report Status 07/07/2018 FINAL  Final  Culture, respiratory     Status: None   Collection Time: 07/07/18  4:11 PM   Specimen: Sputum  Result Value Ref Range Status   Specimen Description SPU  Final   Special Requests NONE Reflexed from [id number]  Final   Gram Stain   Final    ABUNDANT WBC PRESENT, PREDOMINANTLY PMN RARE SQUAMOUS EPITHELIAL CELLS PRESENT RARE GRAM POSITIVE COCCI IN PAIRS RARE GRAM POSITIVE RODS    Culture   Final    Consistent with normal respiratory flora. Performed at [facility], [place] [address], [place], [place] [address]    Report Status 07/10/2018 FINAL  Final     Labs: Basic Metabolic Panel: Recent Labs  Lab 07/06/18 9518  07/07/18 0254 07/08/18 0342 07/09/18 8416 07/10/18 0321 07/10/18 1408 07/10/18 1412 07/11/18 0453 07/12/18 0444  NA 130*   < >  134* 131* 130* 127* 132*  129* 132* 128* 127*  K 4.4   < > 3.4* 4.1 3.8 4.6 4.2  4.5 3.9 4.9 4.7  CL 95*   < > 98 93* 86* 84*  --   --  89* 95*  CO2 24   < > 26 28 32 34*  --   --  31 25  GLUCOSE 184*   < > 106* 107* 92 108*  --   --  121* 111*  BUN 41*   < > 37* 27* 20 17  --   --  17 14  CREATININE 2.39*   < > 1.50* 1.17* 1.05* 1.02*  --   --  1.08* 0.96  CALCIUM 8.9   < > 8.9 9.4 9.2 9.1  --   --  9.1 8.9  MG 2.5*  --  2.0 1.9 1.7 2.5*  --   --   --  2.2  PHOS 5.3*  --  3.8 2.2* 2.0* 1.9*  --   --   --   --    < > = values in this interval not displayed.   Liver Function Tests: Recent Labs  Lab 07/06/18 0733 07/07/18 0254 07/08/18 0342 07/09/18 0619 07/11/18 0453  AST 42* 45* 59* 72* 55*  ALT 19 19 22 25 27   ALKPHOS 75 73 77 76 80  BILITOT 2.8* 3.5* 4.0* 3.8* 3.0*  PROT 6.6 6.6 6.8 6.6 6.7  ALBUMIN 3.2* 3.1* 3.2* 2.9* 2.9*   No results for input(s): LIPASE, AMYLASE in the last 168 hours. No results for input(s): AMMONIA in the last 168 hours. CBC: Recent Labs   Lab 07/08/18 0342 07/09/18 1720 07/10/18 0321 07/10/18 1408 07/10/18 1412 07/11/18 0453 07/12/18 0444  WBC 7.3 6.6 6.7  --   --  7.3 7.7  NEUTROABS  --   --  3.7  --   --  4.2 4.9  HGB 11.5* 13.0 12.9 15.3*  16.0* 14.6 12.5 12.9  HCT 34.2* 38.6 37.7 45.0  47.0* 43.0 37.7 39.2  MCV 70.4* 68.9* 68.2*  --   --  69.6* 70.6*  PLT 254 321 328  --   --  317 408*   Cardiac Enzymes: Recent Labs  Lab 07/05/18 1543 07/06/18 0733  CKTOTAL  --  140  CKMB  --  2.9  TROPONINI 0.08*  --    BNP: BNP (last 3 results) Recent Labs    04/16/18 0050 05/21/18 1451 07/03/18 0503  BNP 1,857.5* 1,580.9* 2,450.2*    ProBNP (last 3 results) No results for input(s): PROBNP in the last 8760 hours.  CBG: No results for input(s): GLUCAP in the last 168 hours.     Signed:  Dia Crawford, MD [facility] [phone] pager

## 2018-07-12 NOTE — Progress Notes (Signed)
Discharge instructions given to patient. [facility] Patient going home with daugther and belongings.

## 2018-07-12 NOTE — TOC Transition Note (Addendum)
Transition of Care [facility]) - CM/SW Discharge Note   Patient Details  Name: Sabrina Holland MRN: [id number] Date of Birth: 1970-03-28  Transition of [facility]) CM/SW Contact:  Maryclare Labrador, RN Phone Number: 07/12/2018, 11:17 AM   Clinical Narrative:  Pt to discharge home today via private vehicle.  Pt confirms she is active with [facility] and gets medications on a regular basis from clinic pharmacy.  Pt informed CM that she does not have copay amounts today for discharge meds - TOC department to overide pt in [facility] program and will include override for copays as well.   Pt is interested in [facility] - CM provided referral and agency will follow up directly with pt.  Pt educated on importance of daily weights and low salt diet    Final next level of care: Home/Self Care Barriers to Discharge: Barriers Resolved   Patient Goals and CMS Choice Patient states their goals for this hospitalization and ongoing recovery are:: want to be here for my kids and grandkids      Discharge Placement                       Discharge Plan and Services In-house Referral: Clinical Social Work                                   Social Determinants of Health (SDOH) Interventions     Readmission Risk Interventions No flowsheet data found.

## 2018-07-12 NOTE — Progress Notes (Signed)
Advanced Heart Failure Rounding Note   Subjective:     Milrinone stopped yesterday. Feels good, denies SOB, orthopnea or PND.   Objective:   Weight Range:  Vital Signs:   Temp:  [97.4 F (36.3 C)-97.8 F (36.6 C)] 97.8 F (36.6 C) (06/25 0400) Pulse Rate:  [85-95] 85 (06/25 0400) Resp:  [13-24] 18 (06/25 0400) BP: (103-130)/(65-99) 130/85 (06/25 0400) SpO2:  [96 %-100 %] 100 % (06/25 0400) Weight:  [52.5 kg-52.7 kg] 52.7 kg (06/25 0400) Last BM Date: 07/10/18  Weight change: Filed Weights   07/10/18 0545 07/11/18 0620 07/12/18 0400  Weight: 51.4 kg 52.5 kg 52.7 kg    Intake/Output:   Intake/Output Summary (Last 24 hours) at 07/12/2018 0618 Last data filed at 07/12/2018 0400 Gross per 24 hour  Intake 609.64 ml  Output 850 ml  Net -240.36 ml     Physical Exam: General:  Lying in bed. No resp difficulty HEENT: normal Neck: supple. no JVD. Carotids 2+ bilat; no bruits. No lymphadenopathy or thryomegaly appreciated. Cor: PMI nondisplaced. Regular rate & rhythm. No rubs, gallops or murmurs. Lungs: clear Abdomen: soft, nontender, nondistended. No hepatosplenomegaly. No bruits or masses. Good bowel sounds. Extremities: no cyanosis, clubbing, rash, edema Neuro: alert & orientedx3, cranial nerves grossly intact. moves all 4 extremities w/o difficulty. Affect pleasant   Telemetry: Sinus 80s. Personally reviewed   Labs: Basic Metabolic Panel: Recent Labs  Lab 07/06/18 0733  07/07/18 0254 07/08/18 0342 07/09/18 4496 07/10/18 0321 07/10/18 1408 07/10/18 1412 07/11/18 0453 07/12/18 0444  NA 130*   < > 134* 131* 130* 127* 132*  129* 132* 128* 127*  K 4.4   < > 3.4* 4.1 3.8 4.6 4.2  4.5 3.9 4.9 4.7  CL 95*   < > 98 93* 86* 84*  --   --  89* 95*  CO2 24   < > 26 28 32 34*  --   --  31 25  GLUCOSE 184*   < > 106* 107* 92 108*  --   --  121* 111*  BUN 41*   < > 37* 27* 20 17  --   --  17 14  CREATININE 2.39*   < > 1.50* 1.17* 1.05* 1.02*  --   --  1.08* 0.96   CALCIUM 8.9   < > 8.9 9.4 9.2 9.1  --   --  9.1 8.9  MG 2.5*  --  2.0 1.9 1.7 2.5*  --   --   --  2.2  PHOS 5.3*  --  3.8 2.2* 2.0* 1.9*  --   --   --   --    < > = values in this interval not displayed.    Liver Function Tests: Recent Labs  Lab 07/06/18 0733 07/07/18 0254 07/08/18 0342 07/09/18 0619 07/11/18 0453  AST 42* 45* 59* 72* 55*  ALT 19 19 22 25 27   ALKPHOS 75 73 77 76 80  BILITOT 2.8* 3.5* 4.0* 3.8* 3.0*  PROT 6.6 6.6 6.8 6.6 6.7  ALBUMIN 3.2* 3.1* 3.2* 2.9* 2.9*   No results for input(s): LIPASE, AMYLASE in the last 168 hours. No results for input(s): AMMONIA in the last 168 hours.  CBC: Recent Labs  Lab 07/08/18 0342 07/09/18 1720 07/10/18 0321 07/10/18 1408 07/10/18 1412 07/11/18 0453 07/12/18 0444  WBC 7.3 6.6 6.7  --   --  7.3 7.7  NEUTROABS  --   --  3.7  --   --  4.2 4.9  HGB 11.5* 13.0 12.9 15.3*  16.0* 14.6 12.5 12.9  HCT 34.2* 38.6 37.7 45.0  47.0* 43.0 37.7 39.2  MCV 70.4* 68.9* 68.2*  --   --  69.6* 70.6*  PLT 254 321 328  --   --  317 408*    Cardiac Enzymes: Recent Labs  Lab 07/05/18 0704 07/05/18 1007 07/05/18 1543 07/06/18 0733  CKTOTAL  --   --   --  140  CKMB  --   --   --  2.9  TROPONINI 0.08* 0.08* 0.08*  --     BNP: BNP (last 3 results) Recent Labs    04/16/18 0050 05/21/18 1451 07/03/18 0503  BNP 1,857.5* 1,580.9* 2,450.2*    ProBNP (last 3 results) No results for input(s): PROBNP in the last 8760 hours.    Other results:  Imaging: No results found.   Medications:     Scheduled Medications: . amiodarone  200 mg Oral BID  . atorvastatin  40 mg Oral q1800  . budesonide (PULMICORT) nebulizer solution  0.5 mg Nebulization BID  . Chlorhexidine Gluconate Cloth  6 each Topical Daily  . Chlorhexidine Gluconate Cloth  6 each Topical Daily  . digoxin  0.125 mg Oral Daily  . enoxaparin (LOVENOX) injection  40 mg Subcutaneous Q24H  . feeding supplement (ENSURE ENLIVE)  237 mL Oral BID BM  . folic acid  1 mg  Oral Daily  . ipratropium-albuterol  3 mL Nebulization BID  . losartan  25 mg Oral BID  . mouth rinse  15 mL Mouth Rinse BID  . megestrol  400 mg Oral BID  . multivitamin with minerals  1 tablet Oral Daily  . pantoprazole  40 mg Oral Daily  . sodium chloride flush  10-40 mL Intracatheter Q12H  . sodium chloride flush  3 mL Intravenous Q12H  . spironolactone  25 mg Oral Daily  . thiamine  100 mg Oral Daily    Infusions: . sodium chloride Stopped (07/11/18 2200)    PRN Medications: sodium chloride, acetaminophen, albuterol, hydrALAZINE, ondansetron (ZOFRAN) IV, phenol, senna-docusate, sodium chloride flush, sodium chloride flush, sorbitol, traMADol   Assessment/Plan:    1. Acute on chronic systolic HF -> cardiogenic shock - Longstanding EF 25-30%. Suspect NICM but hasn't agreed to ischemic w/u to date - Echo 07/05/18 EF 15% RV moderately to severely down Personally reviewed - Suspect due to polysubstance abuse +/- tachy-induced. - Cath 6/23 normal cors.  - Co-ox stable off milrinone x 24  - Volume status ok - Continue sprio and dig - Continue losartan 25 bid. BP too low to titrate - No b-blocker yet due to shock - Not candidate for home inotropes or advanced therapies due to ongoing drug use  2. Cardiac arrestVFF/TdP  - s/p Code Blue 6/19 with defib x 1 - rhythm stable - Keep K> 4.0 Mg > 2.0  3. Acute hypoxic respiratory failure - extubated 6/20 - improved with diuresis  - treating for PNA - cefipime completed 6/25r 6/25   4. AKI, probable typical - due to HF/cardiorenal/arrest - resolved  5. AFL - now back in SR - on po amio - restart Eliquis today. (watch for oral bleeding)  6. Polysubstance abuse - longstanding. Will be major impediment to care  7. Hypokalemia/Hypomagnesemia - improved with supp - stop K supp now off IV lasix    8. Acute bronchitis with purulent sputum - continue cefipime. Completed  6/25   Ok for d/c home from my standpoint.  Will arrange HF  and paramedicine f/u and will need meds from HF Fund prior to d/c if qualifies  D/c meds  Eliquis 5 bid Amio 200 bid Lasix 40 daily Spiro 12.5 daily  Losartan 50 daily Digoxin 0.125 daily   No b-blocker yet. Can stop atorva.   Length of Stay: 9   Glori Bickers MD 07/12/2018, 6:18 AM  Advanced Heart Failure Team Pager [phone] (M-F; [place])  Please contact [facility] for night-coverage after hours (4p -7a ) and weekends on [email or web address]

## 2018-07-12 NOTE — Plan of Care (Signed)
  Problem: Education: Goal: Ability to demonstrate management of disease process will improve Outcome: Completed/Met Goal: Ability to verbalize understanding of medication therapies will improve Outcome: Completed/Met Goal: Individualized Educational Video(s) Outcome: Completed/Met   Problem: Activity: Goal: Capacity to carry out activities will improve Outcome: Completed/Met   Problem: Cardiac: Goal: Ability to achieve and maintain adequate cardiopulmonary perfusion will improve Outcome: Completed/Met   Problem: Education: Goal: Knowledge of General Education information will improve Description: Including pain rating scale, medication(s)/side effects and non-pharmacologic comfort measures Outcome: Completed/Met   Problem: Health Behavior/Discharge Planning: Goal: Ability to manage health-related needs will improve Outcome: Completed/Met   Problem: Clinical Measurements: Goal: Ability to maintain clinical measurements within normal limits will improve Outcome: Completed/Met Goal: Will remain free from infection Outcome: Completed/Met Goal: Diagnostic test results will improve Outcome: Completed/Met Goal: Respiratory complications will improve Outcome: Completed/Met Goal: Cardiovascular complication will be avoided Outcome: Completed/Met   Problem: Activity: Goal: Risk for activity intolerance will decrease Outcome: Completed/Met   Problem: Nutrition: Goal: Adequate nutrition will be maintained Outcome: Completed/Met   Problem: Coping: Goal: Level of anxiety will decrease Outcome: Completed/Met   Problem: Elimination: Goal: Will not experience complications related to bowel motility Outcome: Completed/Met Goal: Will not experience complications related to urinary retention Outcome: Completed/Met   Problem: Pain Managment: Goal: General experience of comfort will improve Outcome: Completed/Met   Problem: Safety: Goal: Ability to remain free from injury will  improve Outcome: Completed/Met   Problem: Skin Integrity: Goal: Risk for impaired skin integrity will decrease Outcome: Completed/Met

## 2018-07-13 ENCOUNTER — Telehealth (HOSPITAL_COMMUNITY): Payer: Self-pay

## 2018-07-13 ENCOUNTER — Telehealth: Payer: Self-pay | Admitting: Adult Health Nurse Practitioner

## 2018-07-13 ENCOUNTER — Telehealth: Payer: Self-pay

## 2018-07-13 NOTE — Telephone Encounter (Signed)
Talked with patient and have scheduled a Telephone Palliative Consult for 07/18/18 @ 11 AM.  Verbal consent rec'd from patient for Palliative services.

## 2018-07-13 NOTE — Telephone Encounter (Signed)
Transition Care Management Follow-up Telephone Call Date of discharge and from where: 07/12/2018, [facility]   Call placed to # [phone], message left requesting call back to this CM # [phone]

## 2018-07-13 NOTE — Telephone Encounter (Signed)
I called pt to schedule initial CHP visit. She stated that she's been very constipated since she came home from the hospital.  She hasn't been able to get her medications yet.  She stated that she has a cough and wanted to know what she could take for the cough I sent her a picture text of the meds that she can and can't take.  I told her that I will see her next week at the heart failure clinic.

## 2018-07-16 ENCOUNTER — Telehealth: Payer: Self-pay

## 2018-07-16 MED FILL — LISINOPRIL 20 MG TABLET: 20 | 30 days supply | Qty: 30 | Fill #1

## 2018-07-16 MED FILL — ?OMEPRAZOLE 20 MG CAPSULE D: 20 | 30 days supply | Qty: 30 | Fill #2

## 2018-07-16 NOTE — Telephone Encounter (Signed)
Transition Care Management Follow-up Telephone Call - attempt # 2  Date of discharge and from where: 07/12/2018, [facility]   Call placed to # [phone], message left requesting call back to this CM # [phone].  Patient needs appointment scheduled

## 2018-07-18 ENCOUNTER — Encounter (HOSPITAL_COMMUNITY): Payer: Self-pay

## 2018-07-18 ENCOUNTER — Other Ambulatory Visit (HOSPITAL_COMMUNITY): Payer: Self-pay

## 2018-07-18 ENCOUNTER — Other Ambulatory Visit: Payer: Self-pay | Admitting: Adult Health Nurse Practitioner

## 2018-07-18 ENCOUNTER — Ambulatory Visit (HOSPITAL_COMMUNITY)
Admission: RE | Admit: 2018-07-18 | Discharge: 2018-07-18 | Disposition: A | Discharge status: Home / Self Care | Payer: Medicaid Other | Source: Ambulatory Visit | Attending: Cardiology | Admitting: Cardiology

## 2018-07-18 ENCOUNTER — Telehealth (HOSPITAL_COMMUNITY): Payer: Self-pay | Admitting: Licensed Clinical Social Worker

## 2018-07-18 ENCOUNTER — Other Ambulatory Visit: Payer: Self-pay

## 2018-07-18 VITALS — BP 106/74 | HR 95 | Wt 128.2 lb

## 2018-07-18 DIAGNOSIS — Z7901 Long term (current) use of anticoagulants: Secondary | ICD-10-CM | POA: Insufficient documentation

## 2018-07-18 DIAGNOSIS — Z8673 Personal history of transient ischemic attack (TIA), and cerebral infarction without residual deficits: Secondary | ICD-10-CM | POA: Diagnosis not present

## 2018-07-18 DIAGNOSIS — I4892 Unspecified atrial flutter: Secondary | ICD-10-CM | POA: Insufficient documentation

## 2018-07-18 DIAGNOSIS — I5042 Chronic combined systolic (congestive) and diastolic (congestive) heart failure: Secondary | ICD-10-CM | POA: Diagnosis not present

## 2018-07-18 DIAGNOSIS — Z853 Personal history of malignant neoplasm of breast: Secondary | ICD-10-CM | POA: Insufficient documentation

## 2018-07-18 DIAGNOSIS — I469 Cardiac arrest, cause unspecified: Secondary | ICD-10-CM | POA: Diagnosis not present

## 2018-07-18 DIAGNOSIS — Z515 Encounter for palliative care: Secondary | ICD-10-CM

## 2018-07-18 DIAGNOSIS — I5043 Acute on chronic combined systolic (congestive) and diastolic (congestive) heart failure: Secondary | ICD-10-CM

## 2018-07-18 DIAGNOSIS — F1410 Cocaine abuse, uncomplicated: Secondary | ICD-10-CM | POA: Insufficient documentation

## 2018-07-18 DIAGNOSIS — Z7951 Long term (current) use of inhaled steroids: Secondary | ICD-10-CM | POA: Diagnosis not present

## 2018-07-18 DIAGNOSIS — F17210 Nicotine dependence, cigarettes, uncomplicated: Secondary | ICD-10-CM | POA: Diagnosis not present

## 2018-07-18 DIAGNOSIS — N182 Chronic kidney disease, stage 2 (mild): Secondary | ICD-10-CM | POA: Insufficient documentation

## 2018-07-18 DIAGNOSIS — Z590 Homelessness: Secondary | ICD-10-CM | POA: Insufficient documentation

## 2018-07-18 DIAGNOSIS — Z720 Tobacco use: Secondary | ICD-10-CM

## 2018-07-18 DIAGNOSIS — I10 Essential (primary) hypertension: Secondary | ICD-10-CM

## 2018-07-18 DIAGNOSIS — I130 Hypertensive heart and chronic kidney disease with heart failure and stage 1 through stage 4 chronic kidney disease, or unspecified chronic kidney disease: Secondary | ICD-10-CM | POA: Insufficient documentation

## 2018-07-18 DIAGNOSIS — I340 Nonrheumatic mitral (valve) insufficiency: Secondary | ICD-10-CM | POA: Insufficient documentation

## 2018-07-18 DIAGNOSIS — Z791 Long term (current) use of non-steroidal anti-inflammatories (NSAID): Secondary | ICD-10-CM | POA: Diagnosis not present

## 2018-07-18 DIAGNOSIS — Z79899 Other long term (current) drug therapy: Secondary | ICD-10-CM | POA: Insufficient documentation

## 2018-07-18 DIAGNOSIS — Z836 Family history of other diseases of the respiratory system: Secondary | ICD-10-CM | POA: Diagnosis not present

## 2018-07-18 DIAGNOSIS — J449 Chronic obstructive pulmonary disease, unspecified: Secondary | ICD-10-CM | POA: Insufficient documentation

## 2018-07-18 DIAGNOSIS — I480 Paroxysmal atrial fibrillation: Secondary | ICD-10-CM | POA: Insufficient documentation

## 2018-07-18 LAB — BASIC METABOLIC PANEL
Anion gap: 8 (ref 5–15)
BUN: 15 mg/dL (ref 6–20)
CO2: 27 mmol/L (ref 22–32)
Calcium: 9 mg/dL (ref 8.9–10.3)
Chloride: 105 mmol/L (ref 98–111)
Creatinine, Ser: 0.94 mg/dL (ref 0.44–1.00)
GFR calc Af Amer: >60 mL/min (ref >60)
GFR calc non Af Amer: >60 mL/min (ref >60)
Glucose, Bld: 98 mg/dL (ref 70–99)
Potassium: 3.6 mmol/L (ref 3.5–5.1)
Sodium: 140 mmol/L (ref 135–145)

## 2018-07-18 MED ORDER — LOSARTAN POTASSIUM 25 MG PO TABS: 12.5000 mg | ORAL_TABLET | Freq: Every day | ORAL | 3 refills | Status: DC

## 2018-07-18 MED ORDER — AMIODARONE HCL 200 MG PO TABS: 200.0000 mg | ORAL_TABLET | Freq: Two times a day (BID) | ORAL | 0 refills | Status: DC

## 2018-07-18 MED ORDER — FUROSEMIDE 20 MG PO TABS: 40.0000 mg | ORAL_TABLET | Freq: Every day | ORAL | 0 refills | Status: DC

## 2018-07-18 MED ORDER — APIXABAN 5 MG PO TABS: 5.0000 mg | ORAL_TABLET | Freq: Two times a day (BID) | ORAL | 3 refills | Status: DC

## 2018-07-18 MED ORDER — SPIRONOLACTONE 25 MG PO TABS: 12.5000 mg | ORAL_TABLET | Freq: Every day | ORAL | 0 refills | Status: DC

## 2018-07-18 MED ORDER — DIGOXIN 125 MCG PO TABS: 0.1250 mg | ORAL_TABLET | Freq: Every day | ORAL | 0 refills | Status: DC

## 2018-07-18 MED FILL — AMIODARONE HCL 200 MG TAB: 200 | 30 days supply | Qty: 60 | Fill #0

## 2018-07-18 MED FILL — DIGOXIN 0.125 MG TABLET: 125 | 30 days supply | Qty: 30 | Fill #0

## 2018-07-18 MED FILL — SPIRONOLACTONE 25 MG TABLET: 25 | 30 days supply | Qty: 15 | Fill #0

## 2018-07-18 MED FILL — LOSARTAN POTASSIUM 25 MG TA: 25 | 30 days supply | Qty: 15 | Fill #0

## 2018-07-18 MED FILL — !ELIQUIS 5MG TABLET: 5 | 30 days supply | Qty: 60 | Fill #0

## 2018-07-18 MED FILL — FUROSEMIDE 20 MG TABS: 20 | 30 days supply | Qty: 60 | Fill #0

## 2018-07-18 NOTE — Addendum Note (Signed)
Encounter addended by: Marlise Eves, RN on: 07/18/2018 4:28 PM  Actions taken: Order list changed

## 2018-07-18 NOTE — Progress Notes (Signed)
2 boxes Eliquis 5 mg (14 tabs each) samples given to pt  LOT [id number]  EXP SEP 2022

## 2018-07-18 NOTE — Progress Notes (Signed)
Paramedicine Encounter   Patient ID: Sabrina Holland , female,   DOB: 1970/02/18,48 y.o.,  MRN: [id number]   Met patient in clinic today with provider Amy.   Pt stated that she has a addition to crack cocaine, alcohol, and marijuana for at least 15 yrs. She admits that she tried getting some the other day but the person wouldn't get it for her. [name] is open to out patient care for the addictions. She's a current pt with [facility]; Opal Sidles is also working with her. She's decreased the amount of cigarettes she's smoking. Pt stated that she's trying to keep herself busy throughout the day.   She's currently living with her daughter. She's working with someone to get information together for disability. I will f/u with pt tomorrow. Eliezer Lofts said that she'll call pt later this afternoon to assess her needs.   Amy discussed the importance of her health condition.  EKG and blood work done today in office.  Dizziness when she stands up after she takes her meds. She doesn't have medical insurance.    Med list is missing the following: Eliquis 75m twice daily losartatin  Time spent with patient 30 mins   DUniversal Citycopper, EMT-Paramedic [phone]/01/2018   ACTION: Home visit completed

## 2018-07-18 NOTE — Progress Notes (Signed)
[facility] Consult Note Telephone: [phone]  Fax: [phone]  PATIENT NAME: Sabrina Holland DOB: 25-Dec-1970 MRN: [id number]  PRIMARY CARE PROVIDER:   Gildardo Pounds, NP  REFERRING PROVIDER:  Gildardo Pounds, NP [name],  [place] [address]  RESPONSIBLE PARTY:       RECOMMENDATIONS and PLAN:   Had telephone visit with patient at 11:00 am.  Called patient with no answer.  Left VM with reason for call and contact info.  I spent 0 minutes providing this consultation,  from 11:00 to 11:00. More than 50% of the time in this consultation was spent coordinating communication.   HISTORY OF PRESENT ILLNESS:  Sabrina Holland is a 48 y.o. year old female with multiple medical problems including breast cancer, CAF,COPD, CKD stage 2, HTN, . Palliative Care was asked to help address goals of care.   CODE STATUS:   PPS: 0% HOSPICE ELIGIBILITY/DIAGNOSIS: TBD  PAST MEDICAL HISTORY:  Past Medical History:  Diagnosis Date  . Atrial flutter ([place]) 07/03/2018  . Breast cancer, stage 2 ([place]) 02/23/2011  . Chronic combined systolic and diastolic CHF (congestive heart failure) ([place])   . CKD (chronic kidney disease), stage II   . Cocaine use   . COPD (chronic obstructive pulmonary disease) ([place])   . Homelessness   . Hypertension   . Polysubstance abuse ([place])   . Poor social situation   . TIA (transient ischemic attack) 11/25/2011   "first time" (11/25/2011)    SOCIAL HX:  Social History   Tobacco Use  . Smoking status: Current Some Day Smoker    Packs/day: 5.00    Years: 30.00    Pack years: 150.00    Types: Cigarettes  . Smokeless tobacco: Never Used  . Tobacco comment: 1 or 2 cigarettes a day- approx 1 pack week  Substance Use Topics  . Alcohol use: Not Currently    Alcohol/week: 12.0 standard drinks    Types: 12 Cans of beer per week    Comment: last drink was 5/19    ALLERGIES:  Allergies   Allergen Reactions  . Penicillins Other (See Comments)    Pt had slight throat swelling Has patient had a PCN reaction causing immediate rash, facial/tongue/throat swelling, SOB or lightheadedness with hypotension: yes Has patient had a PCN reaction causing severe rash involving mucus membranes or skin necrosis: No Has patient had a PCN reaction that required hospitalization No Has patient had a PCN reaction occurring within the last 10 years: No If all of the above answers are "NO", then may proceed with Cephalosporin use.      PERTINENT MEDICATIONS:  Outpatient Encounter Medications as of 07/18/2018  Medication Sig  . amiodarone (PACERONE) 200 MG tablet Take 1 tablet (200 mg total) by mouth 2 (two) times daily.  Marland Kitchen apixaban (ELIQUIS) 5 MG TABS tablet Take 1 tablet (5 mg total) by mouth 2 (two) times daily.  . budesonide-formoterol (SYMBICORT) 80-4.5 MCG/ACT inhaler Inhale 2 puffs into the lungs 2 (two) times daily.  . diclofenac sodium (VOLTAREN) 1 % GEL Apply 2 g topically 4 (four) times daily.  . digoxin (LANOXIN) 0.125 MG tablet Take 1 tablet (0.125 mg total) by mouth daily.  . ferrous sulfate 325 (65 FE) MG tablet Take 1 tablet (325 mg total) by mouth 2 (two) times daily with a meal.  . fluticasone (FLONASE) 50 MCG/ACT nasal spray Place 2 sprays into both nostrils daily.  . folic acid (FOLVITE) 1  MG tablet Take 1 tablet (1 mg total) by mouth daily.  . furosemide (LASIX) 20 MG tablet Take 2 tablets (40 mg total) by mouth daily.  Marland Kitchen lidocaine (LIDODERM) 5 % Place 1 patch onto the skin daily. Remove & Discard patch within 12 hours or as directed by MD  . losartan (COZAAR) 25 MG tablet Take 0.5 tablets (12.5 mg total) by mouth daily.  . megestrol (MEGACE) 400 MG/10ML suspension Take 10 mLs (400 mg total) by mouth 2 (two) times daily.  Marland Kitchen mouth rinse LIQD solution 15 mLs by Mouth Rinse route 2 (two) times daily.  . phenol (CHLORASEPTIC) 1.4 % LIQD Use as directed 1 spray in the mouth or throat  as needed for throat irritation / pain.  Marland Kitchen spironolactone (ALDACTONE) 25 MG tablet Take 0.5 tablets (12.5 mg total) by mouth daily.  Marland Kitchen thiamine 100 MG tablet Take 1 tablet (100 mg total) by mouth daily.  Marland Kitchen tiotropium (SPIRIVA HANDIHALER) 18 MCG inhalation capsule Place 1 capsule (18 mcg total) into inhaler and inhale daily.   No facility-administered encounter medications on file as of 07/18/2018.      Vicent Febles Jenetta Downer, NP

## 2018-07-18 NOTE — Telephone Encounter (Signed)
CSW referred to follow up with patient post hospitalization to address social concerns. CSW contacted patient and reminded of appointment today in the HF Clinic. Patient states she is on her way now to appointment. Patient was getting on the bus and will return call to [place] after appointment. CSW followed up with clinic staff as well to inform. Raquel Sarna, [place], [place]

## 2018-07-18 NOTE — Patient Instructions (Addendum)
Labs were done today. We will call you with any ABNORMAL results. No news is good news!  EKG was completed.  BEGIN taking Eliquis 5 mg (1 tab) twice a day.  BEGIN taking Losartan 12.5 mg (0.5 tab) daily at bedtime.   Your physician recommends that you schedule a follow-up appointment in: 3-4 weeks.   At the [facility], you and your health needs are our priority. As part of our continuing mission to provide you with exceptional heart care, we have created designated Provider Care Teams. These Care Teams include your primary Cardiologist (physician) and Advanced Practice Providers (APPs- Physician Assistants and Nurse Practitioners) who all work together to provide you with the care you need, when you need it.   You may see any of the following providers on your designated Care Team at your next follow up: Marland Kitchen Dr Glori Bickers . Dr Loralie Champagne . Darrick Grinder, NP

## 2018-07-18 NOTE — Progress Notes (Signed)
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  She had a normal echocardiogram in 2013 as part of a work-up for TIA. In 06/2017  admitted with SOB and initially had left AMA. However, she returned and was found to have severe CHF with EF 25-30%, grade 3 DD. She was diuresed and started on guideline directed therapy. She has been following with Dr. Recardo Evangelist but therapy limited by her social situation and recurrent cocaine use. Last echo 11/2017 showed EF 25-30%, mild MR, mild LAE, PASP 29mmHg. She was admitted in 02/2018 for recurrent HF. She was not taking Lasix as prescribed due to frequent urination at work. She refused coronary CT or cath at that time She has subsequently followed up in telehealth with adjustment in meds. Dr. Sallyanne Kuster has suggested avoidance of beta blocker given continued cocaine use.   Presented to [facility] 07/03/2018 with increased SOB and chest tightness. +  For cocaine on admit. VF arrest on 6/19 with defibrillation x1 and intubation.ECHO showed severely reduced EF at 15%.  Treated for PNA.Had [place] wit normal cors. Discharged to home on 07/12/2018.   Today she returns for post hospital follow up. Overall feeling a little better. She has not used cocaine since discharge. She is interested in outpatient programs for substance abuse. Complaining of chest soreness. Requesting pain medication and ambien.  SOB with steps. Denies PND/Orthopnea. Appetite ok. No fever or chills. She has not been weighing at home because she doesn't have a scale. Smoking 1 cigarette per day. Taking all medications. Living with her  daughter. Currently not working.   Echo 07/05/18 EF 15% RV moderately to severely down  LHC 07/10/18  normal cors.   ROS: All systems negative except as listed in HPI, PMH and Problem List.  SH:  Social History   Socioeconomic History  . Marital status: Single    Spouse name: Not on file  . Number of children: 4  . Years of education: Not on file  . Highest education level: Not on file  Occupational History  . Occupation: trying to get disability    Employer: [employer]  Social Needs  . Financial resource strain: Not on file  . Food insecurity    Worry: Not on file    Inability: Not on file  . Transportation needs    Medical: Not on file    Non-medical: Not on file  Tobacco Use  . Smoking status: Current Some Day Smoker    Packs/day: 5.00    Years: 30.00    Pack years: 150.00    Types: Cigarettes  . Smokeless tobacco: Never Used  . Tobacco comment: 1 or 2 cigarettes a day- approx 1 pack week  Substance and Sexual Activity  . Alcohol use: Not Currently    Alcohol/week: 12.0 standard drinks    Types: 12 Cans of beer per week    Comment: last drink was 5/19  . Drug use: Yes    Types: Marijuana, Cocaine    Comment: daily marijuana; last use of cocaine 05/02/18 but selling it  . Sexual activity: Not Currently    Birth control/protection: Surgical  Lifestyle  . Physical activity    Days per week: Not on file  Minutes per session: Not on file  . Stress: Not on file  Relationships  . Social Herbalist on phone: Not on file    Gets together: Not on file    Attends religious service: Not on file    Active member of club or organization: Not on file    Attends meetings of clubs or organizations: Not on file    Relationship status: Not on file  . Intimate partner violence    Fear of current or ex partner: Not on file    Emotionally abused: Not on file    Physically abused: Not on file    Forced sexual activity: Not on file  Other Topics Concern  . Not  on file  Social History Narrative   ** Merged History Encounter **        FH:  Family History  Problem Relation Age of Onset  . COPD Father   . Sickle cell anemia Cousin        maternal cousin    Past Medical History:  Diagnosis Date  . Atrial flutter ([place]) 07/03/2018  . Breast cancer, stage 2 ([place]) 02/23/2011  . Chronic combined systolic and diastolic CHF (congestive heart failure) ([place])   . CKD (chronic kidney disease), stage II   . Cocaine use   . COPD (chronic obstructive pulmonary disease) ([place])   . Homelessness   . Hypertension   . Polysubstance abuse ([place])   . Poor social situation   . TIA (transient ischemic attack) 11/25/2011   "first time" (11/25/2011)    Current Outpatient Medications  Medication Sig Dispense Refill  . amiodarone (PACERONE) 200 MG tablet Take 1 tablet (200 mg total) by mouth 2 (two) times daily. 60 tablet 0  . budesonide-formoterol (SYMBICORT) 80-4.5 MCG/ACT inhaler Inhale 2 puffs into the lungs 2 (two) times daily. 1 Inhaler 6  . diclofenac sodium (VOLTAREN) 1 % GEL Apply 2 g topically 4 (four) times daily. 100 g 1  . digoxin (LANOXIN) 0.125 MG tablet Take 1 tablet (0.125 mg total) by mouth daily. 30 tablet 0  . ferrous sulfate 325 (65 FE) MG tablet Take 1 tablet (325 mg total) by mouth 2 (two) times daily with a meal. 60 tablet 0  . fluticasone (FLONASE) 50 MCG/ACT nasal spray Place 2 sprays into both nostrils daily. 16 g 0  . folic acid (FOLVITE) 1 MG tablet Take 1 tablet (1 mg total) by mouth daily. 30 tablet 0  . furosemide (LASIX) 20 MG tablet Take 2 tablets (40 mg total) by mouth daily for 30 days. 60 tablet 0  . lidocaine (LIDODERM) 5 % Place 1 patch onto the skin daily. Remove & Discard patch within 12 hours or as directed by MD 30 patch 0  . megestrol (MEGACE) 400 MG/10ML suspension Take 10 mLs (400 mg total) by mouth 2 (two) times daily. 240 mL 0  . mouth rinse LIQD solution 15 mLs by Mouth Rinse route 2 (two) times daily. 44 mL 0  .  phenol (CHLORASEPTIC) 1.4 % LIQD Use as directed 1 spray in the mouth or throat as needed for throat irritation / pain. 20 mL 0  . spironolactone (ALDACTONE) 25 MG tablet Take 0.5 tablets (12.5 mg total) by mouth daily. 30 tablet 0  . thiamine 100 MG tablet Take 1 tablet (100 mg total) by mouth daily. 30 tablet 0  . tiotropium (SPIRIVA HANDIHALER) 18 MCG inhalation capsule Place 1 capsule (18 mcg total) into inhaler and inhale daily. [place]  capsule 11   No current facility-administered medications for this encounter.     Vitals:   07/18/18 1041  BP: 106/74  Pulse: 95  SpO2: 100%  Weight: 58.2 kg (128 lb 3.2 oz)   Wt Readings from Last 3 Encounters:  07/18/18 58.2 kg (128 lb 3.2 oz)  07/12/18 52.7 kg (116 lb 2.9 oz)  07/02/18 57.6 kg (127 lb)    PHYSICAL EXAM: General:  Thin. No resp difficulty HEENT: normal poor dentition Neck: supple. JVP flat. Carotids 2+ bilaterally; no bruits. No lymphadenopathy or thryomegaly appreciated. Cor: PMI normal. Regular rate & rhythm. No rubs, gallops or murmurs. Lungs: clear Abdomen: soft, nontender, nondistended. No hepatosplenomegaly. No bruits or masses. Good bowel sounds. Extremities: no cyanosis, clubbing, rash, edema Neuro: alert & orientedx3, cranial nerves grossly intact. Moves all 4 extremities w/o difficulty. Affect pleasant.      ASSESSMENT & PLAN: 1. Chronic systolic HF  - Longstanding  Echo 07/05/18 EF 15% RV moderately to severely downPersonally reviewed. Suspect due to polysubstance abuse +/- tachy-induced. - Cath 6/23 normal cors.  - Volume status stable. Continue lasix 40 mg daily.  - Continue spiro 12.5 mg daily.  - Continue dig - Add 12.5 mg losartan at bed time.  - No b-blocker yet due to shock and cocaine abuse.  - Not candidate for home inotropes or advanced therapies due to ongoing drug use - Check BMEt today.   2. Cardiac arrest VFF/TdP  - s/p Code Blue 6/19 with defib x 1  3. PAF Regular on exam.  - on po amio  200 mg twice a day. Next visit check EKG and will cut back amio to 200 mg daily.  - restart eliquis 5 mg twice a day.   4. Polysubstance abuse/cocaine abuse - longstanding. Referred to HF SW for outpatient substance abuse programs.   5. Tobacco Abuse.  Discussed smoking cessation.  Follow up in 2-3 weeks. Today she was set up with HF Paramedicine. She was provided with a scale. I personally called and discussed with HFSW her condition and referral to substance abuse program.   Greater than 50% of the (total minutes 40) visit spent in counseling/coordination of care regarding the above.   Alianys Chacko NP-C  3:40 PM

## 2018-07-19 ENCOUNTER — Telehealth (HOSPITAL_COMMUNITY): Payer: Self-pay | Admitting: Pharmacy Technician

## 2018-07-19 ENCOUNTER — Telehealth (HOSPITAL_COMMUNITY): Payer: Self-pay | Admitting: Licensed Clinical Social Worker

## 2018-07-19 ENCOUNTER — Telehealth: Payer: Self-pay | Admitting: Adult Health Nurse Practitioner

## 2018-07-19 ENCOUNTER — Telehealth: Payer: Self-pay

## 2018-07-19 ENCOUNTER — Telehealth (HOSPITAL_COMMUNITY): Payer: Self-pay

## 2018-07-19 NOTE — Telephone Encounter (Signed)
Paramedicine Initial Assessment:  Housing:  In what kind of housing do you live? House/apt/trailer/shelter? [address] [place], [place]   Do you rent/pay a mortgage/own? Rent   Do you live with anyone? daughter  Are you currently worried about losing your housing? no  Within the past 12 months have you ever stayed outside, in a car, tent, a shelter, or temporarily with someone? no  Within the past 12 months have you been unable to get utilities when it was really needed? no  Social:  What is your current marital status? single  Do you have any children? 4 kids- 1 son and 2 daughters in [place] and daughter who lives in [place].  States that children are very supportive  Do you have family or friends who live locally? yes  Food:  Within the past 12 months were you ever worried that food would run out before you got money to buy more? yes  Within the past 60months have you run out of food and didn't have money to buy more? no  Income:  What is your current source of income? No sources of income since May 2020- had to stop working because of health concerns  How hard is it for you to pay for the basics like food housing, medical care, and utilities?  Do you have outstanding medical bills?  Insurance:  Are you currently insured? Medicaid pending  Do you have prescription coverage?  If no insurance, have you applied for coverage (Medicaid, disability, marketplace etc)?  Transportation:  Do you have transportation to your medical appointments? Yes,   If yes, how? bus  In the past 12 months has lack of transportation kept you from medical appts or from getting medications? yes  In the past 12 months has lack of transportation kept you from meetings, work, or getting things you needed? yes   Daily Health Needs: Do you have a working scale at home? No- but is being brought one by paramedicine  How do you manage your medications at home? Separate  medications into categories (take with food, time of day etc)  Do you ever take your medications differently than prescribed? no  Do you have issues affording your medications? Sometimes  If yes, has this ever prevented you from obtaining medications? no  Do you have any concerns with mobility at home? A little weak due to SOB  Do you use any assistive devices at home or have PCS at home? no   Are there any additional barriers you see to getting the care you need? no  CSW will continue to follow through paramedicine program and assist as needed.  Jorge Ny, LCSW Clinical Social Worker [facility] Desk#: [phone] Cell#: [phone]

## 2018-07-19 NOTE — Telephone Encounter (Signed)
Pt returned CSW call regarding substance abuse counseling.  Pt reports that she is very interested in receiving support for her drug abuse at this time.  Pt states that she has struggled with addiction for years but last time she went to rehab was about 15 years ago- states she has not sought out rehab since then because she has not been interested in help.  Pt states she is very motivated to change at this time because of the severity of her most recent admission to the hospital.  Pt states that her heart stopped during her hospital stay and she is now able to see how much her drug use is contributing to her bad health.  Patient already engaging in change actions- she reports that she moved in with her daughter after being released from the hospital so she would no longer be around the people that she was using drugs with and being encouraged to use drugs by.  Pt states this change in environment has been critical but she still experiences temptation.  States she looks at the picture her daughter sent her of her in the hospital when she gets tempted to remind herself how low she was.  Pt reports good support system by her children and that she is very motivated to stop for them as she didn't realize the negative effect she was having on them as well.  CSW called to various outpatient drug rehab programs and able to set pt up with intake appt on Monday for [facility] the [place] left message for pt to inform.  CSW will continue to follow and assist as needed  Jorge Ny, [facility] Desk#: [phone] Cell#: [phone]

## 2018-07-19 NOTE — Telephone Encounter (Signed)
Call received from [place], EMT/Community Paramedicine.  She is planning to meet with the patient today and will inform her that an appointment has been scheduled with PCP for 07/25/2018 @ 1050.  The provider will determine if it is a televisit or in-person.  [name] explained that the patient stated that she is currently living with her daughter and has her own room.  Patient is also working on a disability application.

## 2018-07-19 NOTE — Telephone Encounter (Signed)
I called Mrs. Sabrina Holland to advise her that I was on my way to visit. She said that she's not at home and requested that I come by tomorrow morning. [facility], they said that a man picked up pt's meds a couple of hours ago.

## 2018-07-19 NOTE — Telephone Encounter (Signed)
Spoke with patient and have rescheduled the Telephone Palliative consult that was scheduled for 7/1 to 07/23/18 @ 11 AM.

## 2018-07-19 NOTE — Telephone Encounter (Signed)
Patient was seen in clinic and given Patient Assistance application through [employer] for [employer].  Completed application was faxed today.  Jodelle Gross (CPhT) [facility] Patient Advocate  07/19/2018 3:33 PM

## 2018-07-19 NOTE — Telephone Encounter (Signed)
CSW informed by community paramedic that pt is interested in substance abuse counseling resources and is requesting CSW reach out to discuss and help refer.  CSW called pt yesterday and spoke with pt briefly but she stated she was not in a place to talk and requested CSW call back in an hour.  CSW called back at requested time and had to leave voicemail and did not receive a call back.  CSW called this morning and left additional voicemail- will await return call from pt to discuss substance abuse resources and complete paramedicine initial assessment.  Jorge Ny, LCSW Clinical Social Worker [facility] Desk#: [phone] Cell#: [phone]

## 2018-07-20 ENCOUNTER — Other Ambulatory Visit (HOSPITAL_COMMUNITY): Payer: Self-pay

## 2018-07-20 NOTE — Progress Notes (Signed)
I met Sabrina Holland outside of her daughter's apartment. She stated that she' walked already this morning. I dropped the eliquis and scale off to her.  She said that she went looking for out patient rehab faculties yesterday.  She said that she's trying to stay busy.

## 2018-07-23 ENCOUNTER — Telehealth (HOSPITAL_COMMUNITY): Payer: Self-pay | Admitting: Licensed Clinical Social Worker

## 2018-07-23 ENCOUNTER — Other Ambulatory Visit: Payer: Self-pay

## 2018-07-23 ENCOUNTER — Other Ambulatory Visit: Payer: Self-pay | Admitting: Adult Health Nurse Practitioner

## 2018-07-23 DIAGNOSIS — Z515 Encounter for palliative care: Secondary | ICD-10-CM

## 2018-07-23 NOTE — Progress Notes (Signed)
[facility] Consult Note Telephone: [phone]  Fax: [phone]  PATIENT NAME: Sabrina Holland DOB: [birth year] MRN: [id number]  PRIMARY CARE PROVIDER:   Gildardo Pounds, NP  REFERRING PROVIDER:  Gildardo Pounds, NP [address],  [place] [id number]  RESPONSIBLE PARTY:  Self [phone] Brodie Scovell, son, [phone]  Due to the COVID-19 crisis, this visit was done via telemedicine and it was initiated and consent by this patient and or family. Video-audio (telehealth) contact was unable to be done due to technical barriers from the patient's side.    RECOMMENDATIONS and PLAN:  1.  Cocaine addiction.  Patient admits to years of cocaine use.  Has been clean since her hospital stay starting on 07/05/2018.  She was in the hospital 6/18-6/25/2020 for cardiogenic shock related to cocaine use.  She did code and require CPR while in the hospital and had to be intubated.  She does state that her chest wall still hurts from the CPR and that it is hard for her to cough without pain.  Does state that she still gets phlegm and mucus from being intubated but this is improving. Have encouraged for comfort that she can support her ribcage by hugging a pillow when she has to cough.  States that she is in the process of getting a sponsor to help with addiction recovery.She does state that some days are worse than others and when she is having a bad day she prays.  States that she tries to be honest with her family about how she is doing during her recovery and her family has been supportive and call her frequently.  States her family took pictures of her when she was in the hospital hooked up to machines and tubes and states that she does not want to do that again.  Encouraged her to keep up with progress that she was doing great with it and to always be honest with family friends of how she is feeling so she can get support.  2. COPD/ SOB.   She has SOB even at rest.  States that this is new.  Has 3 inhalers and neb treatments for this.  States that she tries to go for a walk every morning but goes very slowly due SOB and does not go alone.  She does use O2.  Have scheduled appointment next week to check O2 levels and lung sounds.    3.  Anxiety/depression.  She states that she does have some anxiety and depression.  Does not feel like it is much with all she has just gone through.  Denies SI.  Does state that she does not sleep well and will wake up after a couple hours of sleep.  Though some nights she will sleep all night.  Continue to monitor for worsening symptoms.  May start by adding  Melatonin if has continued sleep disturbance.    4.  Goals of Care.  She currently lives with her daughter but she is looking for housing in which she can be more independent.  Her son calls her daily.  She has a lot of family support and endorses she tries to stay away from past friends from her past lifestyle.  Will discuss ACP at our meeting next week.    I spent 30 minutes providing this consultation,  from 11:00 to 11:30. More than 50% of the time in this consultation was spent coordinating communication.   HISTORY OF PRESENT ILLNESS:  Sabrina Holland is a 48 y.o. year old female with multiple medical problems including CHF, COPD, CKD stage 2, h/o breast cancer, cocaine addiction. Palliative Care was asked to help address goals of care.   CODE STATUS: Full Code  PPS: 60% HOSPICE ELIGIBILITY/DIAGNOSIS: TBD  PAST MEDICAL HISTORY:  Past Medical History:  Diagnosis Date  . Atrial flutter ([place]) 07/03/2018  . Breast cancer, stage 2 ([place]) 02/23/2011  . Chronic combined systolic and diastolic CHF (congestive heart failure) ([place])   . CKD (chronic kidney disease), stage II   . Cocaine use   . COPD (chronic obstructive pulmonary disease) ([place])   . Homelessness   . Hypertension   . Polysubstance abuse ([place])   . Poor social situation   .  TIA (transient ischemic attack) 11/25/2011   "first time" (11/25/2011)    SOCIAL HX:  Social History   Tobacco Use  . Smoking status: Current Some Day Smoker    Packs/day: 5.00    Years: 30.00    Pack years: 150.00    Types: Cigarettes  . Smokeless tobacco: Never Used  . Tobacco comment: 1 or 2 cigarettes a day- approx 1 pack week  Substance Use Topics  . Alcohol use: Not Currently    Alcohol/week: 12.0 standard drinks    Types: 12 Cans of beer per week    Comment: last drink was 5/19    ALLERGIES:  Allergies  Allergen Reactions  . Penicillins Other (See Comments)    Pt had slight throat swelling Has patient had a PCN reaction causing immediate rash, facial/tongue/throat swelling, SOB or lightheadedness with hypotension: yes Has patient had a PCN reaction causing severe rash involving mucus membranes or skin necrosis: No Has patient had a PCN reaction that required hospitalization No Has patient had a PCN reaction occurring within the last 10 years: No If all of the above answers are "NO", then may proceed with Cephalosporin use.      PERTINENT MEDICATIONS:  Outpatient Encounter Medications as of 07/23/2018  Medication Sig  . amiodarone (PACERONE) 200 MG tablet Take 1 tablet (200 mg total) by mouth 2 (two) times daily.  Marland Kitchen apixaban (ELIQUIS) 5 MG TABS tablet Take 1 tablet (5 mg total) by mouth 2 (two) times daily.  . budesonide-formoterol (SYMBICORT) 80-4.5 MCG/ACT inhaler Inhale 2 puffs into the lungs 2 (two) times daily.  . diclofenac sodium (VOLTAREN) 1 % GEL Apply 2 g topically 4 (four) times daily.  . digoxin (LANOXIN) 0.125 MG tablet Take 1 tablet (0.125 mg total) by mouth daily.  . ferrous sulfate 325 (65 FE) MG tablet Take 1 tablet (325 mg total) by mouth 2 (two) times daily with a meal.  . fluticasone (FLONASE) 50 MCG/ACT nasal spray Place 2 sprays into both nostrils daily.  . folic acid (FOLVITE) 1 MG tablet Take 1 tablet (1 mg total) by mouth daily.  . furosemide  (LASIX) 20 MG tablet Take 2 tablets (40 mg total) by mouth daily.  Marland Kitchen lidocaine (LIDODERM) 5 % Place 1 patch onto the skin daily. Remove & Discard patch within 12 hours or as directed by MD  . losartan (COZAAR) 25 MG tablet Take 0.5 tablets (12.5 mg total) by mouth daily.  . megestrol (MEGACE) 400 MG/10ML suspension Take 10 mLs (400 mg total) by mouth 2 (two) times daily.  Marland Kitchen mouth rinse LIQD solution 15 mLs by Mouth Rinse route 2 (two) times daily.  . phenol (CHLORASEPTIC) 1.4 % LIQD Use as directed 1 spray in the mouth or  throat as needed for throat irritation / pain.  Marland Kitchen spironolactone (ALDACTONE) 25 MG tablet Take 0.5 tablets (12.5 mg total) by mouth daily.  Marland Kitchen thiamine 100 MG tablet Take 1 tablet (100 mg total) by mouth daily.  Marland Kitchen tiotropium (SPIRIVA HANDIHALER) 18 MCG inhalation capsule Place 1 capsule (18 mcg total) into inhaler and inhale daily.   No facility-administered encounter medications on file as of 07/23/2018.       Francesca Strome Jenetta Downer, NP

## 2018-07-23 NOTE — Telephone Encounter (Signed)
CSW called pt this morning to confirm she had gotten my message about [facility] follow up with her regarding intake appt today.  CSW informed pt that they would be calling from unknown number so needed to be on the look out for their call- pt expressed understanding.  Pt stated that she had a tough weekend and was very tempted to start using again but was able to avoid it by reminding herself by taking a walk and calling her son to talk her through the temptation.  CSW provided active listening and reinforced positive coping mechanisms that patient portrayed.  CSW encouraged pt to reach out to CSW in the future if needed.  CSW will continue to follow and assist as needed  Jorge Ny, [facility] Desk#: [phone] Cell#: [phone]

## 2018-07-24 ENCOUNTER — Telehealth: Payer: Self-pay

## 2018-07-24 ENCOUNTER — Telehealth (HOSPITAL_COMMUNITY): Payer: Self-pay | Admitting: Licensed Clinical Social Worker

## 2018-07-24 NOTE — Telephone Encounter (Signed)
CSW called pt to check if [facility] had reached out to her for substance abuse counseling intake assessment.  Pt reports not having heard from [facility] for intake so CSW called intake line and spoke with representative- she states she will call pt right now to complete intake  CSW will continue to follow and assist as needed  Jorge Ny, [facility] Desk#: [phone] Cell#: [phone]

## 2018-07-24 NOTE — Telephone Encounter (Signed)
Call returned to patient. She was inquiring if she could have an in person visit with PCP tomorrow. Informed her that her PCP said that an in person visit is not necessary as she has seen her cardiologist in person since she was discharged from the hospital.  Ms Sabrina Holland also recommends that she speak to her cardiologist about the disability application. The patient stated that she is in the process of applying for disability and is requesting assistance from [employer] [place],  Informed her that this CM would contact Legal Aid.   The patient also stated that she has not been able to reach DSS about medicaid application. Informed her that she can pick up a copy of the application at this clinic.  She is also interested in housing but will need to have an income.

## 2018-07-25 ENCOUNTER — Telehealth: Payer: Self-pay

## 2018-07-25 ENCOUNTER — Encounter: Payer: Self-pay | Admitting: Nurse Practitioner

## 2018-07-25 ENCOUNTER — Other Ambulatory Visit: Payer: Self-pay

## 2018-07-25 ENCOUNTER — Ambulatory Visit: Payer: Self-pay | Attending: Nurse Practitioner | Admitting: Nurse Practitioner

## 2018-07-25 DIAGNOSIS — J449 Chronic obstructive pulmonary disease, unspecified: Secondary | ICD-10-CM

## 2018-07-25 DIAGNOSIS — Z09 Encounter for follow-up examination after completed treatment for conditions other than malignant neoplasm: Secondary | ICD-10-CM

## 2018-07-25 DIAGNOSIS — I4892 Unspecified atrial flutter: Secondary | ICD-10-CM

## 2018-07-25 MED ORDER — ALBUTEROL SULFATE HFA 108 (90 BASE) MCG/ACT IN AERS: 2.0000 | INHALATION_SPRAY | Freq: Four times a day (QID) | RESPIRATORY_TRACT | 1 refills | Status: DC | PRN

## 2018-07-25 MED FILL — ALBUTEROL SULFATE HFA 108 (: 108 (90 BAS | 18 days supply | Qty: 18 | Fill #0

## 2018-07-25 NOTE — Telephone Encounter (Signed)
This CM spoke to [place], EMT. She plans to see the patient once weekly and can review medications in the home . She noted that the heart failure fund will be able to assist with providing heart failure medications for the patient.   [name] to pick up a medicaid application at [employer] and take to patient.   Message left for Tammy Sours, LCSW regarding outpatient addiction  treatment options for patient.   Referral faxed to [employer] [place], requesting assistance with disability application.

## 2018-07-25 NOTE — Progress Notes (Signed)
Virtual Visit via Telephone Note Due to national recommendations of social distancing due to [place] 19, telehealth visit is felt to be most appropriate for this patient at this time.  I discussed the limitations, risks, security and privacy concerns of performing an evaluation and management service by telephone and the availability of in person appointments. I also discussed with the patient that there may be a patient responsible charge related to this service. The patient expressed understanding and agreed to proceed.    I connected with Sabrina Holland on 07/25/18  at  10:50 AM EDT  EDT by telephone and verified that I am speaking with the correct person using two identifiers.   Consent I discussed the limitations, risks, security and privacy concerns of performing an evaluation and management service by telephone and the availability of in person appointments. I also discussed with the patient that there may be a patient responsible charge related to this service. The patient expressed understanding and agreed to proceed.   Location of Patient: Private Residence   Location of Provider: [place] [facility] [employer] Office    Persons participating in Telemedicine visit: Sabrina Rankins FNP-BC [place]    History of Present Illness: Telemedicine visit for: Hospital follow up HISTORY OF PRESENT ILLNESS:   Multiple medical problems including Chronic combined systolic and diastolic CHF EF 48-27% , COPD, HTN, CKD stage 2, h/o breast cancer, TIA, tobacco dependence, cocaine addiction. Palliative Care was asked to help address goals of care.    HFU She was admitted to the hospital on 07-03-2018 with ultimate VF arrest requiring defibrillation/CPR and intubation. ECHO showing EF now 15%. She was treated for PNA and discharged home in stable condition on 07-12-2018.  She states she has completely stopped smoking however recent chart review: patient still  smoking 1 cigarette per day.  She is very upset today that she was not seen in the office. States she is in critical condition and needs to be seen in the office for all of her visits. I explained to her that she had an in office evaluation by cardiology a few days ago and I was following up with her today via teleheath. She states she has multiple pill bottles with the same names but the pills are different colors and shapes. I explained to her that pill shapes and colors can change based on the manufacturer and what is most important is that she compare her most recent AVS from her cardiology appt with her current medications and to focus on the names and dosages not the color or size. She verbalized understanding.   Per Cardiology:  Volume status stable. Continue lasix 40 mg daily.  - Continue spiro 12.5 mg daily.  - Continue dig - Add 12.5 mg losartan at bed time.  - No b-blocker yet due to shock and cocaine abuse.  - Not candidate for home inotropes or advanced therapies due to ongoing drug use Restart Eliquis 5 mg BID  Polysubstance abuse/cocaine abuse - longstanding. Referred to [facility] SW for outpatient substance abuse programs.   She is currently living with her daughter. Has not filled her eliquis prescription. I have sent it to the [facility] pharmacy  Past Medical History:  Diagnosis Date  . Atrial flutter ([place]) 07/03/2018  . Breast cancer, stage 2 ([place]) 02/23/2011  . Chronic combined systolic and diastolic CHF (congestive heart failure) ([place])   . CKD (chronic kidney disease), stage II   . Cocaine use   . COPD (  chronic obstructive pulmonary disease) ([place])   . Homelessness   . Hypertension   . Polysubstance abuse ([place])   . Poor social situation   . TIA (transient ischemic attack) 11/25/2011   "first time" (11/25/2011)    Past Surgical History:  Procedure Laterality Date  . BREAST BIOPSY  2007   left  . MASTECTOMY  09/2005   left  . RIGHT/LEFT HEART CATH AND CORONARY ANGIOGRAPHY N/A  07/10/2018   Procedure: RIGHT/LEFT HEART CATH AND CORONARY ANGIOGRAPHY;  Surgeon: Jolaine Artist, MD;  Location: [facility];  Service: Cardiovascular;  Laterality: N/A;  . TONSILLECTOMY AND ADENOIDECTOMY     "when I was little" (11/25/2011)  . TUBAL LIGATION  1993    Family History  Problem Relation Age of Onset  . COPD Father   . Sickle cell anemia Cousin        maternal cousin    Social History   Socioeconomic History  . Marital status: Single    Spouse name: Not on file  . Number of children: 4  . Years of education: Not on file  . Highest education level: Not on file  Occupational History  . Occupation: trying to get disability    Employer: [employer]  Social Needs  . Financial resource strain: Not on file  . Food insecurity    Worry: Not on file    Inability: Not on file  . Transportation needs    Medical: Not on file    Non-medical: Not on file  Tobacco Use  . Smoking status: Former Smoker    Packs/day: 5.00    Years: 30.00    Pack years: 150.00    Types: Cigarettes  . Smokeless tobacco: Never Used  . Tobacco comment: 1 or 2 cigarettes a day- approx 1 pack week  Substance and Sexual Activity  . Alcohol use: Not Currently    Alcohol/week: 12.0 standard drinks    Types: 12 Cans of beer per week    Comment: last drink was 5/19  . Drug use: Yes    Types: Marijuana, Cocaine    Comment: daily marijuana; last use of cocaine 05/02/18 but selling it  . Sexual activity: Not Currently    Birth control/protection: Surgical  Lifestyle  . Physical activity    Days per week: Not on file    Minutes per session: Not on file  . Stress: Not on file  Relationships  . Social Herbalist on phone: Not on file    Gets together: Not on file    Attends religious service: Not on file    Active member of club or organization: Not on file    Attends meetings of clubs or organizations: Not on file    Relationship status: Not on file  Other Topics Concern  .  Not on file  Social History Narrative   ** Merged History Encounter **         Observations/Objective: Awake, alert and oriented x 3   Review of Systems  Constitutional: Negative for fever, malaise/fatigue and weight loss.  HENT: Negative.  Negative for nosebleeds.   Eyes: Negative.  Negative for blurred vision, double vision and photophobia.  Respiratory: Negative.  Negative for cough and shortness of breath.   Cardiovascular: Positive for chest pain (likely related to CPR). Negative for palpitations and leg swelling.  Gastrointestinal: Negative.  Negative for heartburn, nausea and vomiting.  Musculoskeletal: Negative.  Negative for myalgias.  Neurological: Negative.  Negative for dizziness, focal  weakness, seizures and headaches.  Psychiatric/Behavioral: Positive for substance abuse (denies any cocaine use since her hospital admission). Negative for suicidal ideas.    Assessment and Plan:  Diagnoses and all orders for this visit:  Hospital discharge follow-up Follow up in 2 months Continue close follow up with Cardiology   Atrial flutter, unspecified type ([place]) -     apixaban (ELIQUIS) 5 MG TABS tablet; Take 1 tablet (5 mg total) by mouth 2 (two) times daily. Patient will pick up scripts today.  Chronic obstructive pulmonary disease, unspecified COPD type (HCC) -     albuterol (VENTOLIN HFA) 108 (90 Base) MCG/ACT inhaler; Inhale 2 puffs into the lungs every 6 (six) hours as needed for wheezing or shortness of breath (cough). Will pick up scripts tomorrow 7-9.     Follow Up Instructions Return in about 2 months (around 09/25/2018).     I discussed the assessment and treatment plan with the patient. The patient was provided an opportunity to ask questions and all were answered. The patient agreed with the plan and demonstrated an understanding of the instructions.   The patient was advised to call back or seek an in-person evaluation if the symptoms worsen or if the condition  fails to improve as anticipated.  I provided 21 minutes of non-face-to-face time during this encounter including median intraservice time, reviewing previous notes, labs, imaging, medications and explaining diagnosis and management.  Gildardo Pounds, FNP-BC

## 2018-07-26 ENCOUNTER — Telehealth (HOSPITAL_COMMUNITY): Payer: Self-pay | Admitting: Licensed Clinical Social Worker

## 2018-07-26 ENCOUNTER — Encounter: Payer: Self-pay | Admitting: Nurse Practitioner

## 2018-07-26 ENCOUNTER — Telehealth (HOSPITAL_COMMUNITY): Payer: Self-pay

## 2018-07-26 MED ORDER — APIXABAN 5 MG PO TABS: 5.0000 mg | ORAL_TABLET | Freq: Two times a day (BID) | ORAL | 3 refills | Status: DC

## 2018-07-26 NOTE — Telephone Encounter (Signed)
I called pt to schedule a CHP visit for tomorrow. Opal Sidles gave me a medicaid application to give to pt.  Eliezer Lofts spoke to her during this telephone visit.  Eliezer Lofts told Mrs. Poage that she's still working on finding her a out pt rehab facilty and is currently waiting for a call back. CHP visit scheduled for tomorrow @  9

## 2018-07-26 NOTE — Telephone Encounter (Signed)
This CM spoke to [place], LCSW. She explained that she has been in contact with the patient.  She said that the patient is not interested in in-patient treatment.  [facility] is not providing outpatient treatment.  They are waiting to hear back from [facility].

## 2018-07-26 NOTE — Telephone Encounter (Signed)
CSW spoke with patient again this morning to see if she has spoken with [facility] to get intake completed for substance abuse counseling-  Pt reports she hasn't received a call from them.  CSW left message on referral line for [facility] to try and connect pt.  Pt is agreeable to doing virtual NA meetings in the meantime- CSW sent to paramedicine to bring to patient tomorrow when they meet.  CSW will continue to follow and assist as needed  Jorge Ny, [facility] Desk#: [phone] Cell#: [phone]

## 2018-07-27 ENCOUNTER — Telehealth (HOSPITAL_COMMUNITY): Payer: Self-pay | Admitting: Licensed Clinical Social Worker

## 2018-07-27 ENCOUNTER — Other Ambulatory Visit (HOSPITAL_COMMUNITY): Payer: Self-pay

## 2018-07-27 NOTE — Progress Notes (Signed)
Paramedicine Encounter    Patient ID: Sabrina Holland, female    DOB: 1970-09-19, 48 y.o.   MRN: [id number]    Patient Care Team: Gildardo Pounds, NP as PCP - General (Nurse Practitioner) Sanda Klein, MD as PCP - Cardiology (Cardiology)  Patient Active Problem List   Diagnosis Date Noted  . Palliative care by specialist   . Goals of care, counseling/discussion   . Acute respiratory failure ([place])   . Endotracheally intubated   . Torsades de pointes ([place])   . Cardiac arrest ([place])   . Atrial flutter ([place]) 07/03/2018  . Elevated LFTs 04/18/2018  . Malnutrition of moderate degree 02/27/2018  . Dyspnea 02/26/2018  . Acute on chronic systolic and diastolic heart failure, NYHA class 3 ([place]) 01/20/2018  . Cocaine abuse ([place]) 12/19/2017  . Mixed hyperlipidemia 12/19/2017  . COPD exacerbation ([place])   . Acute on chronic respiratory failure with hypoxemia ([place]) 12/03/2017  . Cardiomyopathy ([place]) 07/04/2017  . COPD (chronic obstructive pulmonary disease) ([place])   . Chronic combined systolic and diastolic heart failure ([place])   . Anemia 06/20/2017  . Polysubstance abuse ([place]) 11/26/2011  . TIA (transient ischemic attack) 11/25/2011  . Essential hypertension 11/25/2011  . Sinusitis 11/25/2011  . Vitamin B 12 deficiency 07/18/2011  . History of breast cancer 02/23/2011    Current Outpatient Medications:  .  amiodarone (PACERONE) 200 MG tablet, Take 1 tablet (200 mg total) by mouth 2 (two) times daily., Disp: 60 tablet, Rfl: 0 .  apixaban (ELIQUIS) 5 MG TABS tablet, Take 1 tablet (5 mg total) by mouth 2 (two) times daily. Patient will pick up scripts today., Disp: 180 tablet, Rfl: 3 .  budesonide-formoterol (SYMBICORT) 80-4.5 MCG/ACT inhaler, Inhale 2 puffs into the lungs 2 (two) times daily., Disp: 1 Inhaler, Rfl: 6 .  digoxin (LANOXIN) 0.125 MG tablet, Take 1 tablet (0.125 mg total) by mouth daily., Disp: 30 tablet, Rfl: 0 .  ferrous sulfate 325 (65 FE) MG tablet, Take 1 tablet  (325 mg total) by mouth 2 (two) times daily with a meal., Disp: 60 tablet, Rfl: 0 .  folic acid (FOLVITE) 1 MG tablet, Take 1 tablet (1 mg total) by mouth daily., Disp: 30 tablet, Rfl: 0 .  furosemide (LASIX) 20 MG tablet, Take 2 tablets (40 mg total) by mouth daily., Disp: 60 tablet, Rfl: 0 .  losartan (COZAAR) 25 MG tablet, Take 0.5 tablets (12.5 mg total) by mouth daily., Disp: 45 tablet, Rfl: 3 .  spironolactone (ALDACTONE) 25 MG tablet, Take 0.5 tablets (12.5 mg total) by mouth daily., Disp: 30 tablet, Rfl: 0 .  thiamine 100 MG tablet, Take 1 tablet (100 mg total) by mouth daily., Disp: 30 tablet, Rfl: 0 .  albuterol (VENTOLIN HFA) 108 (90 Base) MCG/ACT inhaler, Inhale 2 puffs into the lungs every 6 (six) hours as needed for wheezing or shortness of breath (cough). Will pick up scripts tomorrow 7-9., Disp: 18 g, Rfl: 1 .  diclofenac sodium (VOLTAREN) 1 % GEL, Apply 2 g topically 4 (four) times daily., Disp: 100 g, Rfl: 1 .  fluticasone (FLONASE) 50 MCG/ACT nasal spray, Place 2 sprays into both nostrils daily., Disp: 16 g, Rfl: 0 .  lidocaine (LIDODERM) 5 %, Place 1 patch onto the skin daily. Remove & Discard patch within 12 hours or as directed by MD (Patient not taking: Reported on 07/25/2018), Disp: 30 patch, Rfl: 0 .  megestrol (MEGACE) 400 MG/10ML suspension, Take 10 mLs (400 mg total) by mouth 2 (two) times  daily., Disp: 240 mL, Rfl: 0 .  mouth rinse LIQD solution, 15 mLs by Mouth Rinse route 2 (two) times daily., Disp: 44 mL, Rfl: 0 .  phenol (CHLORASEPTIC) 1.4 % LIQD, Use as directed 1 spray in the mouth or throat as needed for throat irritation / pain., Disp: 20 mL, Rfl: 0 .  tiotropium (SPIRIVA HANDIHALER) 18 MCG inhalation capsule, Place 1 capsule (18 mcg total) into inhaler and inhale daily., Disp: 30 capsule, Rfl: 11 Allergies  Allergen Reactions  . Penicillins Other (See Comments)    Pt had slight throat swelling Has patient had a PCN reaction causing immediate rash,  facial/tongue/throat swelling, SOB or lightheadedness with hypotension: yes Has patient had a PCN reaction causing severe rash involving mucus membranes or skin necrosis: No Has patient had a PCN reaction that required hospitalization No Has patient had a PCN reaction occurring within the last 10 years: No If all of the above answers are "NO", then may proceed with Cephalosporin use.      Social History   Socioeconomic History  . Marital status: Single    Spouse name: Not on file  . Number of children: 4  . Years of education: Not on file  . Highest education level: Not on file  Occupational History  . Occupation: trying to get disability    Employer: [employer]  Social Needs  . Financial resource strain: Not on file  . Food insecurity    Worry: Not on file    Inability: Not on file  . Transportation needs    Medical: Not on file    Non-medical: Not on file  Tobacco Use  . Smoking status: Former Smoker    Packs/day: 5.00    Years: 30.00    Pack years: 150.00    Types: Cigarettes  . Smokeless tobacco: Never Used  . Tobacco comment: 1 or 2 cigarettes a day- approx 1 pack week  Substance and Sexual Activity  . Alcohol use: Not Currently    Alcohol/week: 12.0 standard drinks    Types: 12 Cans of beer per week    Comment: last drink was 5/19  . Drug use: Yes    Types: Marijuana, Cocaine    Comment: daily marijuana; last use of cocaine 05/02/18 but selling it  . Sexual activity: Not Currently    Birth control/protection: Surgical  Lifestyle  . Physical activity    Days per week: Not on file    Minutes per session: Not on file  . Stress: Not on file  Relationships  . Social Herbalist on phone: Not on file    Gets together: Not on file    Attends religious service: Not on file    Active member of club or organization: Not on file    Attends meetings of clubs or organizations: Not on file    Relationship status: Not on file  . Intimate partner violence     Fear of current or ex partner: Not on file    Emotionally abused: Not on file    Physically abused: Not on file    Forced sexual activity: Not on file  Other Topics Concern  . Not on file  Social History Narrative   ** Merged History Encounter **        Physical Exam Pulmonary:     Effort: No respiratory distress.     Breath sounds: No wheezing or rales.  Musculoskeletal:     Right lower leg: No edema.  Left lower leg: No edema.  Skin:    General: Skin is warm and dry.         Future Appointments  Date Time Provider [place]  08/08/2018 12:00 PM [facility] PA/NP [facility] None     BP 118/84 (BP Location: Right Arm, Patient Position: Sitting, Cuff Size: Normal)   Pulse (!) 113   Temp 98.5 F (36.9 C) (Temporal)   Resp 16   Wt 127 lb (57.6 kg)   LMP 02/18/2015 (Exact Date) Comment: MAYBE ONCE A YEAR  SpO2 99%   BMI 21.13 kg/m    ATF pt CAO x4 sitting in the living room; just woke up. She said that she had a long night, a close friend past away earlier this week.  Mrs. Adger has all of her medications; she understands how to take them.  She agreed to use a pill box;  I explained to her how to use the pill box.  She denies sob, chest pain and dizziness. We talked about sodium and fluid limitations and how to stay within those limits.  She has a good understanding of both.  We discussed her eating habits; she said that she's been staying away from fried foods and is trying to eat better.  She remains active and is trying to keep herself "busy". Rx bottles verified and pill box refilled.  The mediations that she's not currently taking I place in a separate bag.  She said that the "new" spriva powder inhaler (with the tab) makes her choke, so she isn't using it as prescribed.  I advised her to continue using the ones that she has and contact her pcp.  I gave her the information about AA/NA virtual or zoom meetings; she admits that a face to face meeting will work  better for her.  She doesn't know how to use the zoom app but her daughter said that she will work with her on it  She also filled out the [employer] application.  I will drop it off at [facility].    Medication ordered: Thiamine 100 (completely out)    Annelisa Ryback, EMT Paramedic [phone] 07/27/2018    ACTION: Home visit completed

## 2018-07-27 NOTE — Telephone Encounter (Signed)
CSW called [facility] again to check on substance abuse referral.  Per [facility] their system has been down so they were unable to reach out until it was fixed.  CSW able to complete paper intake form with patient and send in so when they call patient they should just be able to set up an appt time.  CSW also called [facility] program to inquire about self-pay slots- they will follow up with patient Monday to see if she would qualify.  CSW called pt to discuss.  Pt has been frustrated that she is unable to get in anywhere quickly and is starting to feel at risk of a relapse.  She states she is still very motivated to stay away from drugs but whenever she is alone or inactive she finds it easy to start thinking about it again.  CSW and pt discussed coping mechanisms.  Pt states she takes a bus ride downtown everyday and walks around to get away from the house and this helps her to get out of her head, also states she enjoys crossword puzzles.  Pt reports being active with her church and attending virtual services every Sunday- used to like to socialize with the seniors in the church.  CSW encouraged pt to reach out to her church to see if there are any senior outreach programs during this time she could engage with to help her connect with people and stay active.    Pt expressing a lot of motivation to stay clean despite difficulty- states she wants to be there for her kids and her grandkids and to no longer feel like a burden to them.  Patient reports she feels helpless because she doesn't have any income after having to stop her job and she hasn't gotten unemployment yet.  CSW encouraged pt to recognize all the steps she has already taken towards change and to understand that moving forward is a process.  CSW is sending patient an Addiction workbook so she can begin working on rehab strategies independently while we try to set her up with counseling.  CSW also sending patient a crossword  puzzle book so she continue to use alternative methods to remain active when she has negative thoughts- pt agreeable to this and appreciative for the help.  Pt was given list of virtual NA meetings by the community paramedic today- pt initially not sure she would utilize this but after further discussion she states she is willing to try.  CSW will continue to follow and assist as needed  Jorge Ny, [facility] Desk#: [phone] Cell#: [phone]

## 2018-07-30 ENCOUNTER — Telehealth (HOSPITAL_COMMUNITY): Payer: Self-pay | Admitting: Licensed Clinical Social Worker

## 2018-07-30 NOTE — Telephone Encounter (Signed)
CSW called patient to check in.  Patient reports she is doing ok and was not tempted to use over the weekend because she was able to remain busy by supporting her friend who had lost a family member.  Patient reports she received the packages we sent her but haven't opened them at this time.  Patient very frustrated by inability to get anywhere with unemployment and states this is her main stressor at this time.  Patient unable to talk further currently but CSW will continue to follow and assist as needed  Jorge Ny, [place] [facility] Desk#: [phone] Cell#: [phone]

## 2018-07-31 ENCOUNTER — Telehealth (HOSPITAL_COMMUNITY): Payer: Self-pay | Admitting: Licensed Clinical Social Worker

## 2018-07-31 ENCOUNTER — Other Ambulatory Visit: Payer: Self-pay | Admitting: Adult Health Nurse Practitioner

## 2018-07-31 DIAGNOSIS — Z515 Encounter for palliative care: Secondary | ICD-10-CM

## 2018-07-31 NOTE — Progress Notes (Signed)
[facility] Consult Note Telephone: [phone]  Fax: [phone]  PATIENT NAME: Sabrina Holland DOB: Feb 14, 1970 MRN: [id number]  PRIMARY CARE PROVIDER:   Gildardo Pounds, NP  REFERRING PROVIDER:  Gildardo Pounds, NP [place],  [place] [address]  RESPONSIBLE PARTY:   Self [place], son, [phone]      RECOMMENDATIONS and PLAN:  Was supposed to have appointment with patient at 1:00.  She was not at home.  Was able to reach her via phone and reschedule for next week.  She had forgotten appointment and had gone to PT.  Appointment scheduled for 08/07/2018 at 11am.    HISTORY OF PRESENT ILLNESS:  Robert Sunga is a 48 y.o. year old female with multiple medical problems including CHF, COPD, CKD stage 2, h/o breast cancer, cocaine addiction. Palliative Care was asked to help address goals of care.   CODE STATUS: Full Code  PPS: 60% HOSPICE ELIGIBILITY/DIAGNOSIS: TBD  PAST MEDICAL HISTORY:  Past Medical History:  Diagnosis Date  . Atrial flutter ([place]) 07/03/2018  . Breast cancer, stage 2 ([place]) 02/23/2011  . Chronic combined systolic and diastolic CHF (congestive heart failure) ([place])   . CKD (chronic kidney disease), stage II   . Cocaine use   . COPD (chronic obstructive pulmonary disease) ([place])   . Homelessness   . Hypertension   . Polysubstance abuse ([place])   . Poor social situation   . TIA (transient ischemic attack) 11/25/2011   "first time" (11/25/2011)    SOCIAL HX:  Social History   Tobacco Use  . Smoking status: Former Smoker    Packs/day: 5.00    Years: 30.00    Pack years: 150.00    Types: Cigarettes  . Smokeless tobacco: Never Used  . Tobacco comment: 1 or 2 cigarettes a day- approx 1 pack week  Substance Use Topics  . Alcohol use: Not Currently    Alcohol/week: 12.0 standard drinks    Types: 12 Cans of beer per week    Comment: last drink was 5/19     ALLERGIES:  Allergies  Allergen Reactions  . Penicillins Other (See Comments)    Pt had slight throat swelling Has patient had a PCN reaction causing immediate rash, facial/tongue/throat swelling, SOB or lightheadedness with hypotension: yes Has patient had a PCN reaction causing severe rash involving mucus membranes or skin necrosis: No Has patient had a PCN reaction that required hospitalization No Has patient had a PCN reaction occurring within the last 10 years: No If all of the above answers are "NO", then may proceed with Cephalosporin use.      PERTINENT MEDICATIONS:  Outpatient Encounter Medications as of 07/31/2018  Medication Sig  . albuterol (VENTOLIN HFA) 108 (90 Base) MCG/ACT inhaler Inhale 2 puffs into the lungs every 6 (six) hours as needed for wheezing or shortness of breath (cough). Will pick up scripts tomorrow 7-9.  . amiodarone (PACERONE) 200 MG tablet Take 1 tablet (200 mg total) by mouth 2 (two) times daily.  Marland Kitchen apixaban (ELIQUIS) 5 MG TABS tablet Take 1 tablet (5 mg total) by mouth 2 (two) times daily. Patient will pick up scripts today.  . budesonide-formoterol (SYMBICORT) 80-4.5 MCG/ACT inhaler Inhale 2 puffs into the lungs 2 (two) times daily.  . diclofenac sodium (VOLTAREN) 1 % GEL Apply 2 g topically 4 (four) times daily.  . digoxin (LANOXIN) 0.125 MG tablet Take 1 tablet (0.125 mg total) by mouth daily.  Marland Kitchen  ferrous sulfate 325 (65 FE) MG tablet Take 1 tablet (325 mg total) by mouth 2 (two) times daily with a meal.  . fluticasone (FLONASE) 50 MCG/ACT nasal spray Place 2 sprays into both nostrils daily.  . folic acid (FOLVITE) 1 MG tablet Take 1 tablet (1 mg total) by mouth daily.  . furosemide (LASIX) 20 MG tablet Take 2 tablets (40 mg total) by mouth daily.  Marland Kitchen lidocaine (LIDODERM) 5 % Place 1 patch onto the skin daily. Remove & Discard patch within 12 hours or as directed by MD (Patient not taking: Reported on 07/25/2018)  . losartan (COZAAR) 25 MG tablet Take 0.5  tablets (12.5 mg total) by mouth daily.  . megestrol (MEGACE) 400 MG/10ML suspension Take 10 mLs (400 mg total) by mouth 2 (two) times daily.  Marland Kitchen mouth rinse LIQD solution 15 mLs by Mouth Rinse route 2 (two) times daily.  . phenol (CHLORASEPTIC) 1.4 % LIQD Use as directed 1 spray in the mouth or throat as needed for throat irritation / pain.  Marland Kitchen spironolactone (ALDACTONE) 25 MG tablet Take 0.5 tablets (12.5 mg total) by mouth daily.  Marland Kitchen thiamine 100 MG tablet Take 1 tablet (100 mg total) by mouth daily.  Marland Kitchen tiotropium (SPIRIVA HANDIHALER) 18 MCG inhalation capsule Place 1 capsule (18 mcg total) into inhaler and inhale daily.   No facility-administered encounter medications on file as of 07/31/2018.      Kingson Lohmeyer Jenetta Downer, NP

## 2018-07-31 NOTE — Telephone Encounter (Signed)
CSW called pt to check in after interaction yesterday.  Pt apologized for being abrupt on the phone and explained that she was having a bad day yesterday and wasn't in a place to talk to anyone at that time.  Reports that she has been trying to get unemployment since having to stop her job but has been having issues because she was apparently overpaid last time she was on unemployment back in 2018- she reportedly owes them anywhere from $70-400 and so this has created a barrier to her getting further assistance at this time.  Patient reports that she is also having trouble affording food at this time- usually has enough food but she is unable to contribute to the house budget so she feels like a burden to her daughter.  CSW to coordinate with Community Paramedic to get food for patient when she does her home visit Thursday.  Patient does report that while she was getting frustrated yesterday she started reading the addiction workbook that CSW sent her and stated that it has been a great help to start thinking about her addiction and do some self work.  CSW provided pt with number to [facility] the [place] intake line to call back and finish appt so she can set up intake- pt states she will call them today.  CSW also called back [facility] to see if they had attempted to follow up with patient- they will look into and reach back out to patient.  Jorge Ny, LCSW Clinical Social Worker [facility] Desk#: [phone] Cell#: [phone]

## 2018-08-01 ENCOUNTER — Telehealth (INDEPENDENT_AMBULATORY_CARE_PROVIDER_SITE_OTHER): Payer: Self-pay

## 2018-08-01 ENCOUNTER — Other Ambulatory Visit: Payer: Self-pay

## 2018-08-01 NOTE — Telephone Encounter (Signed)
As per Abelino Derrick, [facility], they have still been trying to reach the patient

## 2018-08-02 ENCOUNTER — Other Ambulatory Visit (HOSPITAL_COMMUNITY): Payer: Self-pay

## 2018-08-02 ENCOUNTER — Telehealth (HOSPITAL_COMMUNITY): Payer: Self-pay | Admitting: Licensed Clinical Social Worker

## 2018-08-02 NOTE — Progress Notes (Signed)
Paramedicine Encounter    Patient ID: Sabrina Holland, female    DOB: [birth year], 48 y.o.   MRN: [id number]    Patient Care Team: Gildardo Pounds, NP as PCP - General (Nurse Practitioner) Sanda Klein, MD as PCP - Cardiology (Cardiology)  Patient Active Problem List   Diagnosis Date Noted  . Palliative care by specialist   . Goals of care, counseling/discussion   . Acute respiratory failure ([place])   . Endotracheally intubated   . Torsades de pointes ([place])   . Cardiac arrest ([place])   . Atrial flutter ([place]) 07/03/2018  . Elevated LFTs 04/18/2018  . Malnutrition of moderate degree 02/27/2018  . Dyspnea 02/26/2018  . Acute on chronic systolic and diastolic heart failure, NYHA class 3 ([place]) 01/20/2018  . Cocaine abuse ([place]) 12/19/2017  . Mixed hyperlipidemia 12/19/2017  . COPD exacerbation ([place])   . Acute on chronic respiratory failure with hypoxemia ([place]) 12/03/2017  . Cardiomyopathy ([place]) 07/04/2017  . COPD (chronic obstructive pulmonary disease) ([place])   . Chronic combined systolic and diastolic heart failure ([place])   . Anemia 06/20/2017  . Polysubstance abuse ([place]) 11/26/2011  . TIA (transient ischemic attack) 11/25/2011  . Essential hypertension 11/25/2011  . Sinusitis 11/25/2011  . Vitamin B 12 deficiency 07/18/2011  . History of breast cancer 02/23/2011    Current Outpatient Medications:  .  amiodarone (PACERONE) 200 MG tablet, Take 1 tablet (200 mg total) by mouth 2 (two) times daily., Disp: 60 tablet, Rfl: 0 .  apixaban (ELIQUIS) 5 MG TABS tablet, Take 1 tablet (5 mg total) by mouth 2 (two) times daily. Patient will pick up scripts today., Disp: 180 tablet, Rfl: 3 .  digoxin (LANOXIN) 0.125 MG tablet, Take 1 tablet (0.125 mg total) by mouth daily., Disp: 30 tablet, Rfl: 0 .  ferrous sulfate 325 (65 FE) MG tablet, Take 1 tablet (325 mg total) by mouth 2 (two) times daily with a meal., Disp: 60 tablet, Rfl: 0 .  folic acid (FOLVITE) 1 MG tablet, Take 1 tablet (1  mg total) by mouth daily., Disp: 30 tablet, Rfl: 0 .  furosemide (LASIX) 20 MG tablet, Take 2 tablets (40 mg total) by mouth daily., Disp: 60 tablet, Rfl: 0 .  losartan (COZAAR) 25 MG tablet, Take 0.5 tablets (12.5 mg total) by mouth daily., Disp: 45 tablet, Rfl: 3 .  megestrol (MEGACE) 400 MG/10ML suspension, Take 10 mLs (400 mg total) by mouth 2 (two) times daily., Disp: 240 mL, Rfl: 0 .  spironolactone (ALDACTONE) 25 MG tablet, Take 0.5 tablets (12.5 mg total) by mouth daily., Disp: 30 tablet, Rfl: 0 .  thiamine 100 MG tablet, Take 1 tablet (100 mg total) by mouth daily., Disp: 30 tablet, Rfl: 0 .  tiotropium (SPIRIVA HANDIHALER) 18 MCG inhalation capsule, Place 1 capsule (18 mcg total) into inhaler and inhale daily., Disp: 30 capsule, Rfl: 11 .  albuterol (VENTOLIN HFA) 108 (90 Base) MCG/ACT inhaler, Inhale 2 puffs into the lungs every 6 (six) hours as needed for wheezing or shortness of breath (cough). Will pick up scripts tomorrow 7-9., Disp: 18 g, Rfl: 1 .  budesonide-formoterol (SYMBICORT) 80-4.5 MCG/ACT inhaler, Inhale 2 puffs into the lungs 2 (two) times daily., Disp: 1 Inhaler, Rfl: 6 .  diclofenac sodium (VOLTAREN) 1 % GEL, Apply 2 g topically 4 (four) times daily., Disp: 100 g, Rfl: 1 .  fluticasone (FLONASE) 50 MCG/ACT nasal spray, Place 2 sprays into both nostrils daily., Disp: 16 g, Rfl: 0 .  lidocaine (LIDODERM) 5 %,  Place 1 patch onto the skin daily. Remove & Discard patch within 12 hours or as directed by MD (Patient not taking: Reported on 07/25/2018), Disp: 30 patch, Rfl: 0 .  mouth rinse LIQD solution, 15 mLs by Mouth Rinse route 2 (two) times daily., Disp: 44 mL, Rfl: 0 .  phenol (CHLORASEPTIC) 1.4 % LIQD, Use as directed 1 spray in the mouth or throat as needed for throat irritation / pain., Disp: 20 mL, Rfl: 0 Allergies  Allergen Reactions  . Penicillins Other (See Comments)    Pt had slight throat swelling Has patient had a PCN reaction causing immediate rash,  facial/tongue/throat swelling, SOB or lightheadedness with hypotension: yes Has patient had a PCN reaction causing severe rash involving mucus membranes or skin necrosis: No Has patient had a PCN reaction that required hospitalization No Has patient had a PCN reaction occurring within the last 10 years: No If all of the above answers are "NO", then may proceed with Cephalosporin use.      Social History   Socioeconomic History  . Marital status: Single    Spouse name: Not on file  . Number of children: 4  . Years of education: Not on file  . Highest education level: Not on file  Occupational History  . Occupation: trying to get disability    Employer: [employer]  Social Needs  . Financial resource strain: Not on file  . Food insecurity    Worry: Not on file    Inability: Not on file  . Transportation needs    Medical: Not on file    Non-medical: Not on file  Tobacco Use  . Smoking status: Former Smoker    Packs/day: 5.00    Years: 30.00    Pack years: 150.00    Types: Cigarettes  . Smokeless tobacco: Never Used  . Tobacco comment: 1 or 2 cigarettes a day- approx 1 pack week  Substance and Sexual Activity  . Alcohol use: Not Currently    Alcohol/week: 12.0 standard drinks    Types: 12 Cans of beer per week    Comment: last drink was 5/19  . Drug use: Yes    Types: Marijuana, Cocaine    Comment: daily marijuana; last use of cocaine 05/02/18 but selling it  . Sexual activity: Not Currently    Birth control/protection: Surgical  Lifestyle  . Physical activity    Days per week: Not on file    Minutes per session: Not on file  . Stress: Not on file  Relationships  . Social Herbalist on phone: Not on file    Gets together: Not on file    Attends religious service: Not on file    Active member of club or organization: Not on file    Attends meetings of clubs or organizations: Not on file    Relationship status: Not on file  . Intimate partner violence     Fear of current or ex partner: Not on file    Emotionally abused: Not on file    Physically abused: Not on file    Forced sexual activity: Not on file  Other Topics Concern  . Not on file  Social History Narrative   ** Merged History Encounter **        Physical Exam Pulmonary:     Effort: Pulmonary effort is normal. No respiratory distress.     Breath sounds: No wheezing or rales.  Musculoskeletal:     Right lower leg: No edema.  Left lower leg: No edema.  Skin:    General: Skin is warm and dry.         Future Appointments  Date Time Provider [place]  08/08/2018 12:00 PM [facility] PA/NP [facility] None     BP 110/82 (BP Location: Right Arm, Patient Position: Sitting, Cuff Size: Normal)   Pulse 89   Temp 98.8 F (37.1 C)   Resp 15   Wt 128 lb (58.1 kg)   LMP 02/18/2015 (Exact Date) Comment: MAYBE ONCE A YEAR  SpO2 98%   BMI 21.30 kg/m   Weight yesterday-127 Last visit weight-127  ATF pt CAO x4 talking outside on the phone.  She said that she feels good and has no complaints today.  She's taken all of her medication for the past week.  Pt denies increase of sob when walking, she's still walking every morning.  She also denies chest pain and dizziness.  She's still able to sleep with 1 or 2 pillows at night; she wakes up every two hours per her norm. Mrs. Sutphin admits to drinking alcohol and cocaine use recently.  She said that she was having a "bad day" earlier this week, but she does have a reminder on her phone to call social serv back.   rx bottles verified and pill box refilled.  I filled two boxes because I will be on vacation for next week.  I told her that zak will bring the second box to her next week, either at the clinic appointment or to her house.    Tammy Sours LCSW gave me two bags of food to take to Mrs. Weitz from the food pantry.  Mrs. Romey had paperwork that she had questions about that were mailed to her from [place] financial  services (medicaid application); she wanted to make sure that there's no conflicts. She signed all of the forms; I took the signed forms to [place] for her to put in the mailbox of the worker that is handling her case.     Medication ordered: Losartan  Folic acid CHW Ferrous sulfate CHW eliquis   Chiana Wamser, EMT Paramedic [phone] 08/03/2018    ACTION: Home visit completed

## 2018-08-02 NOTE — Telephone Encounter (Signed)
Pt called CSW to inform that she received form from someone about her Medicaid but wasn't sure what to do with it.  Community paramedic completed home visit today and picked up the forms which were authorized representative forms for [employer], [employer].  Paramedic brought in forms and CSW put in Financial Counseling box.  CSW also got pt food from [employer] pantry to help with current grocery expenses.  CSW will continue to follow and assist as needed  Jorge Ny, [facility] Desk#: [phone] Cell#: [phone]

## 2018-08-06 ENCOUNTER — Telehealth: Payer: Self-pay | Admitting: Adult Health Nurse Practitioner

## 2018-08-06 ENCOUNTER — Telehealth (HOSPITAL_COMMUNITY): Payer: Self-pay | Admitting: Licensed Clinical Social Worker

## 2018-08-06 NOTE — Telephone Encounter (Signed)
CSW called pt to check in- no answer- left voicemail  CSW will continue to follow and assist as needed  Jorge Ny, [facility] Desk#: [phone] Cell#: [phone]

## 2018-08-06 NOTE — Telephone Encounter (Signed)
Patient had requested reminder day before appointment.  Called and reminded patient of appointment in person at 11am tomorrow.  She stated this was still a good time and had it on her calendar. Roselene Gray K. Olena Heckle NP

## 2018-08-07 ENCOUNTER — Encounter: Payer: Self-pay | Admitting: Nurse Practitioner

## 2018-08-07 ENCOUNTER — Other Ambulatory Visit: Payer: Self-pay | Admitting: Adult Health Nurse Practitioner

## 2018-08-07 ENCOUNTER — Other Ambulatory Visit: Payer: Self-pay | Admitting: Nurse Practitioner

## 2018-08-07 ENCOUNTER — Other Ambulatory Visit: Payer: Self-pay

## 2018-08-07 DIAGNOSIS — Z515 Encounter for palliative care: Secondary | ICD-10-CM

## 2018-08-07 MED ORDER — SERTRALINE HCL 50 MG PO TABS: 50.0000 mg | ORAL_TABLET | Freq: Every day | ORAL | 3 refills | Status: DC

## 2018-08-07 NOTE — Progress Notes (Signed)
[facility] Consult Note Telephone: [phone]  Fax: [phone]  PATIENT NAME: Sabrina Holland DOB: 02-16-1970 MRN: [id number]  PRIMARY CARE PROVIDER:   Gildardo Pounds, NP  REFERRING PROVIDER:  Gildardo Pounds, NP [place],  [place] [address]  RESPONSIBLE PARTY:   Self [place], son, [phone]        RECOMMENDATIONS and PLAN:  1.  Depression.  Patient admits that she has been depressed due to her present situation.  She is unable to work and is still going through the process of applying for disability and has no income.  She is currently living with her daughter.  She is also overcoming her addiction to cocaine.  She does admit that she has "used" a few times and feels guilty when she gives into her cravings.  States that she tries to stay busy to keep from giving in to the cravings.  She will visit friends, go to the store, word searches, and Bible devotion to distract herself.  She states that she is hoping to get started with group support meetings at a mental health place (was not sure of the name).  She has not heard anything back.  Her appetite is improving and she has regained the weight she lost in the hospital.  She does have problems staying asleep at night and states getting up every 2-3 hours. Offered support and education about depression. She does get support from her children.  Recommend something like celexa or zoloft to help with situational depression.  2.  CHF.  Patient denies any cough or SOB.  Does state that her chest still hurts from being resuscitated.  She avoids vacuuming or sweeping while her chest is healing.  States that she does have DOE and takes things slowly.  Denies any edema and no edema noted today. Denies orthopnea.  Continue current plan of care and follow up with cardiology  3.  Goals of care.  Patient would eventually like to have her own place again once  she starts getting disability income.  Left information about MOST form and discussed establishing a HCPOA.  Have an appointment in a couple of weeks to go over ACP.  Have tried contacting PCP office and was on hold for a while.  Emailed Dr. Raul Del with depression concerns and to see if she has contact info for the mental health place patient was referring to so I can help her contact them to get support group set up.    I spent 45 minutes providing this consultation,  from 11:00 to 11:45. More than 50% of the time in this consultation was spent coordinating communication.   HISTORY OF PRESENT ILLNESS:  Sabrina Holland is a 48 y.o. year old female with multiple medical problems including CHF, COPD, CKD stage 2, h/o breast cancer, cocaine addiction. Palliative Care was asked to help address goals of care.   CODE STATUS: FULL CODE  PPS: 60% HOSPICE ELIGIBILITY/DIAGNOSIS: TBD  PHYSICAL EXAM:   General: patient in NAD Cardiovascular: regular rate and rhythm Pulmonary: breath sounds clear; normal respiratory effort Extremities: no edema, no joint deformities Skin: no rashes Neurological: Weakness but otherwise nonfocal   PAST MEDICAL HISTORY:  Past Medical History:  Diagnosis Date  . Atrial flutter ([place]) 07/03/2018  . Breast cancer, stage 2 ([place]) 02/23/2011  . Chronic combined systolic and diastolic CHF (congestive heart failure) ([place])   . CKD (chronic kidney disease), stage II   .  Cocaine use   . COPD (chronic obstructive pulmonary disease) ([place])   . Homelessness   . Hypertension   . Polysubstance abuse ([place])   . Poor social situation   . TIA (transient ischemic attack) 11/25/2011   "first time" (11/25/2011)    SOCIAL HX:  Social History   Tobacco Use  . Smoking status: Former Smoker    Packs/day: 5.00    Years: 30.00    Pack years: 150.00    Types: Cigarettes  . Smokeless tobacco: Never Used  . Tobacco comment: 1 or 2 cigarettes a day- approx 1 pack week  Substance  Use Topics  . Alcohol use: Not Currently    Alcohol/week: 12.0 standard drinks    Types: 12 Cans of beer per week    Comment: last drink was 5/19    ALLERGIES:  Allergies  Allergen Reactions  . Penicillins Other (See Comments)    Pt had slight throat swelling Has patient had a PCN reaction causing immediate rash, facial/tongue/throat swelling, SOB or lightheadedness with hypotension: yes Has patient had a PCN reaction causing severe rash involving mucus membranes or skin necrosis: No Has patient had a PCN reaction that required hospitalization No Has patient had a PCN reaction occurring within the last 10 years: No If all of the above answers are "NO", then may proceed with Cephalosporin use.      PERTINENT MEDICATIONS:  Outpatient Encounter Medications as of 08/07/2018  Medication Sig  . albuterol (VENTOLIN HFA) 108 (90 Base) MCG/ACT inhaler Inhale 2 puffs into the lungs every 6 (six) hours as needed for wheezing or shortness of breath (cough). Will pick up scripts tomorrow 7-9.  . amiodarone (PACERONE) 200 MG tablet Take 1 tablet (200 mg total) by mouth 2 (two) times daily.  Marland Kitchen apixaban (ELIQUIS) 5 MG TABS tablet Take 1 tablet (5 mg total) by mouth 2 (two) times daily. Patient will pick up scripts today.  . budesonide-formoterol (SYMBICORT) 80-4.5 MCG/ACT inhaler Inhale 2 puffs into the lungs 2 (two) times daily.  . diclofenac sodium (VOLTAREN) 1 % GEL Apply 2 g topically 4 (four) times daily.  . digoxin (LANOXIN) 0.125 MG tablet Take 1 tablet (0.125 mg total) by mouth daily.  . ferrous sulfate 325 (65 FE) MG tablet Take 1 tablet (325 mg total) by mouth 2 (two) times daily with a meal.  . fluticasone (FLONASE) 50 MCG/ACT nasal spray Place 2 sprays into both nostrils daily.  . folic acid (FOLVITE) 1 MG tablet Take 1 tablet (1 mg total) by mouth daily.  . furosemide (LASIX) 20 MG tablet Take 2 tablets (40 mg total) by mouth daily.  Marland Kitchen lidocaine (LIDODERM) 5 % Place 1 patch onto the skin  daily. Remove & Discard patch within 12 hours or as directed by MD (Patient not taking: Reported on 07/25/2018)  . losartan (COZAAR) 25 MG tablet Take 0.5 tablets (12.5 mg total) by mouth daily.  . megestrol (MEGACE) 400 MG/10ML suspension Take 10 mLs (400 mg total) by mouth 2 (two) times daily.  Marland Kitchen mouth rinse LIQD solution 15 mLs by Mouth Rinse route 2 (two) times daily.  . phenol (CHLORASEPTIC) 1.4 % LIQD Use as directed 1 spray in the mouth or throat as needed for throat irritation / pain.  Marland Kitchen spironolactone (ALDACTONE) 25 MG tablet Take 0.5 tablets (12.5 mg total) by mouth daily.  Marland Kitchen thiamine 100 MG tablet Take 1 tablet (100 mg total) by mouth daily.  Marland Kitchen tiotropium (SPIRIVA HANDIHALER) 18 MCG inhalation capsule Place 1 capsule (  18 mcg total) into inhaler and inhale daily.   No facility-administered encounter medications on file as of 08/07/2018.      Tawfiq Favila Jenetta Downer, NP

## 2018-08-08 ENCOUNTER — Telehealth (HOSPITAL_COMMUNITY): Payer: Self-pay | Admitting: Licensed Clinical Social Worker

## 2018-08-08 ENCOUNTER — Encounter (HOSPITAL_COMMUNITY): Payer: Self-pay

## 2018-08-08 MED FILL — SERTRALINE HCL 50 MG TABLET: 50 | 30 days supply | Qty: 30 | Fill #0

## 2018-08-08 NOTE — Telephone Encounter (Signed)
CSW called pt to check in.    Pt reports that she has still not heard from [facility].  States she did call them last week and yesterday but have not heard back.  CSW called [facility] [place] and spoke to a representative- they will reach out to patient today.  CSW also called [facility]-they report they've attempted to call but haven't been able to reach the patient- they will attempt to call again today and CSW informed pt they would be calling.  Pt reports she has used twice in the past month but is trying to move forward from this and appreciate how far she has come since she was using everyday.  Pt states she is reading the work book we sent her everyday and doing the activities and finds this to be helpful.  CSW discussed the prospect of inpatient options again but pt is not currently interested.  States that she doesn't feel comfortable in today's environment going into a facility with other people.  Pt also feels like she has so much she has to do with her unemployment and disability that she couldn't be removed from everything for a month.  CSW will continue to follow and assist as needed  Jorge Ny, [facility] Desk#: [phone] Cell#: [phone]

## 2018-08-09 ENCOUNTER — Telehealth: Payer: Self-pay

## 2018-08-09 ENCOUNTER — Other Ambulatory Visit (HOSPITAL_COMMUNITY): Payer: Self-pay | Admitting: *Deleted

## 2018-08-09 ENCOUNTER — Telehealth (HOSPITAL_COMMUNITY): Payer: Self-pay | Admitting: *Deleted

## 2018-08-09 ENCOUNTER — Other Ambulatory Visit (HOSPITAL_COMMUNITY): Payer: Self-pay

## 2018-08-09 MED ORDER — FERROUS SULFATE 325 (65 FE) MG PO TABS: 325.0000 mg | ORAL_TABLET | Freq: Two times a day (BID) | ORAL | 0 refills | Status: DC

## 2018-08-09 NOTE — Telephone Encounter (Signed)
eliquis sample and patient assistance form left for pick up by Zack w/paramedicine.

## 2018-08-09 NOTE — Progress Notes (Signed)
Paramedicine Encounter    Patient ID: Sabrina Holland, female    DOB: 1970/02/16, 48 y.o.   MRN: [id number]   Patient Care Team: Gildardo Pounds, NP as PCP - General (Nurse Practitioner) Sanda Klein, MD as PCP - Cardiology (Cardiology)  Patient Active Problem List   Diagnosis Date Noted  . Palliative care by specialist   . Goals of care, counseling/discussion   . Acute respiratory failure ([place])   . Endotracheally intubated   . Torsades de pointes ([place])   . Cardiac arrest ([place])   . Atrial flutter ([place]) 07/03/2018  . Elevated LFTs 04/18/2018  . Malnutrition of moderate degree 02/27/2018  . Dyspnea 02/26/2018  . Acute on chronic systolic and diastolic heart failure, NYHA class 3 ([place]) 01/20/2018  . Cocaine abuse ([place]) 12/19/2017  . Mixed hyperlipidemia 12/19/2017  . COPD exacerbation ([place])   . Acute on chronic respiratory failure with hypoxemia ([place]) 12/03/2017  . Cardiomyopathy ([place]) 07/04/2017  . COPD (chronic obstructive pulmonary disease) ([place])   . Chronic combined systolic and diastolic heart failure ([place])   . Anemia 06/20/2017  . Polysubstance abuse ([place]) 11/26/2011  . TIA (transient ischemic attack) 11/25/2011  . Essential hypertension 11/25/2011  . Sinusitis 11/25/2011  . Vitamin B 12 deficiency 07/18/2011  . History of breast cancer 02/23/2011    Current Outpatient Medications:  .  albuterol (VENTOLIN HFA) 108 (90 Base) MCG/ACT inhaler, Inhale 2 puffs into the lungs every 6 (six) hours as needed for wheezing or shortness of breath (cough). Will pick up scripts tomorrow 7-9., Disp: 18 g, Rfl: 1 .  amiodarone (PACERONE) 200 MG tablet, Take 1 tablet (200 mg total) by mouth 2 (two) times daily., Disp: 60 tablet, Rfl: 0 .  apixaban (ELIQUIS) 5 MG TABS tablet, Take 1 tablet (5 mg total) by mouth 2 (two) times daily. Patient will pick up scripts today., Disp: 180 tablet, Rfl: 3 .  budesonide-formoterol (SYMBICORT) 80-4.5 MCG/ACT inhaler, Inhale 2 puffs into the  lungs 2 (two) times daily., Disp: 1 Inhaler, Rfl: 6 .  digoxin (LANOXIN) 0.125 MG tablet, Take 1 tablet (0.125 mg total) by mouth daily., Disp: 30 tablet, Rfl: 0 .  ferrous sulfate 325 (65 FE) MG tablet, Take 1 tablet (325 mg total) by mouth 2 (two) times daily with a meal., Disp: 60 tablet, Rfl: 0 .  fluticasone (FLONASE) 50 MCG/ACT nasal spray, Place 2 sprays into both nostrils daily., Disp: 16 g, Rfl: 0 .  folic acid (FOLVITE) 1 MG tablet, Take 1 tablet (1 mg total) by mouth daily., Disp: 30 tablet, Rfl: 0 .  furosemide (LASIX) 20 MG tablet, Take 2 tablets (40 mg total) by mouth daily., Disp: 60 tablet, Rfl: 0 .  losartan (COZAAR) 25 MG tablet, Take 0.5 tablets (12.5 mg total) by mouth daily., Disp: 45 tablet, Rfl: 3 .  megestrol (MEGACE) 400 MG/10ML suspension, Take 10 mLs (400 mg total) by mouth 2 (two) times daily., Disp: 240 mL, Rfl: 0 .  phenol (CHLORASEPTIC) 1.4 % LIQD, Use as directed 1 spray in the mouth or throat as needed for throat irritation / pain., Disp: 20 mL, Rfl: 0 .  spironolactone (ALDACTONE) 25 MG tablet, Take 0.5 tablets (12.5 mg total) by mouth daily., Disp: 30 tablet, Rfl: 0 .  diclofenac sodium (VOLTAREN) 1 % GEL, Apply 2 g topically 4 (four) times daily. (Patient not taking: Reported on 08/09/2018), Disp: 100 g, Rfl: 1 .  lidocaine (LIDODERM) 5 %, Place 1 patch onto the skin daily. Remove & Discard  patch within 12 hours or as directed by MD (Patient not taking: Reported on 07/25/2018), Disp: 30 patch, Rfl: 0 .  mouth rinse LIQD solution, 15 mLs by Mouth Rinse route 2 (two) times daily. (Patient not taking: Reported on 08/09/2018), Disp: 44 mL, Rfl: 0 .  sertraline (ZOLOFT) 50 MG tablet, Take 1 tablet (50 mg total) by mouth daily. (Patient not taking: Reported on 08/09/2018), Disp: 30 tablet, Rfl: 3 .  thiamine 100 MG tablet, Take 1 tablet (100 mg total) by mouth daily. (Patient not taking: Reported on 08/09/2018), Disp: 30 tablet, Rfl: 0 .  tiotropium (SPIRIVA HANDIHALER) 18 MCG  inhalation capsule, Place 1 capsule (18 mcg total) into inhaler and inhale daily. (Patient not taking: Reported on 08/09/2018), Disp: 30 capsule, Rfl: 11 Allergies  Allergen Reactions  . Penicillins Other (See Comments)    Pt had slight throat swelling Has patient had a PCN reaction causing immediate rash, facial/tongue/throat swelling, SOB or lightheadedness with hypotension: yes Has patient had a PCN reaction causing severe rash involving mucus membranes or skin necrosis: No Has patient had a PCN reaction that required hospitalization No Has patient had a PCN reaction occurring within the last 10 years: No If all of the above answers are "NO", then may proceed with Cephalosporin use.       Social History   Socioeconomic History  . Marital status: Single    Spouse name: Not on file  . Number of children: 4  . Years of education: Not on file  . Highest education level: Not on file  Occupational History  . Occupation: trying to get disability    Employer: [employer]  Social Needs  . Financial resource strain: Not on file  . Food insecurity    Worry: Not on file    Inability: Not on file  . Transportation needs    Medical: Not on file    Non-medical: Not on file  Tobacco Use  . Smoking status: Former Smoker    Packs/day: 5.00    Years: 30.00    Pack years: 150.00    Types: Cigarettes  . Smokeless tobacco: Never Used  . Tobacco comment: 1 or 2 cigarettes a day- approx 1 pack week  Substance and Sexual Activity  . Alcohol use: Not Currently    Alcohol/week: 12.0 standard drinks    Types: 12 Cans of beer per week    Comment: last drink was 5/19  . Drug use: Yes    Types: Marijuana, Cocaine    Comment: daily marijuana; last use of cocaine 05/02/18 but selling it  . Sexual activity: Not Currently    Birth control/protection: Surgical  Lifestyle  . Physical activity    Days per week: Not on file    Minutes per session: Not on file  . Stress: Not on file  Relationships   . Social Herbalist on phone: Not on file    Gets together: Not on file    Attends religious service: Not on file    Active member of club or organization: Not on file    Attends meetings of clubs or organizations: Not on file    Relationship status: Not on file  . Intimate partner violence    Fear of current or ex partner: Not on file    Emotionally abused: Not on file    Physically abused: Not on file    Forced sexual activity: Not on file  Other Topics Concern  . Not on file  Social  History Narrative   ** Merged History Encounter **        Physical Exam Cardiovascular:     Rate and Rhythm: Normal rate and regular rhythm.     Pulses: Normal pulses.  Pulmonary:     Effort: Pulmonary effort is normal.     Breath sounds: Normal breath sounds.  Musculoskeletal: Normal range of motion.     Right lower leg: No edema.     Left lower leg: No edema.  Skin:    General: Skin is warm and dry.     Capillary Refill: Capillary refill takes less than 2 seconds.  Neurological:     Mental Status: She is alert and oriented to person, place, and time.  Psychiatric:        Mood and Affect: Mood normal.         Future Appointments  Date Time Provider [place]  09/04/2018 11:30 AM [facility] PA/NP [facility] None    BP 121/89 (BP Location: Right Arm, Patient Position: Sitting, Cuff Size: Normal)   Pulse 83   Resp 18   Wt 127 lb (57.6 kg)   LMP 02/18/2015 (Exact Date) Comment: MAYBE ONCE A YEAR  SpO2 99%   BMI 21.13 kg/m   Weight yesterday- 127 lb Last visit weight- 128 lb  Ms Tregre was seen at home this morning and reported feeling well. She denied chest pain, SOB, headache, dizziness, orthopnea, cough or fever since her last visit with [facility]. She stated she has been compliant with her medications and her weight has been stable. Her medications were verified and her pillbox was refilled. She ran out of ferrous sulfate and does not have money to get it from the  pharmacy. The same is true for folic acid and Zoloft. I will reach out to [facility] and see what assistance they can offer. I will follow up next week.  Jacquiline Doe, EMT 08/09/18  ACTION: Home visit completed Next visit planned for 1 week

## 2018-08-09 NOTE — Progress Notes (Signed)
CMA spoke to patient and inform PCP sent Zoloft for patient and can pick it up at [facility] with price of 4.00 Patient was inform she can fill out the [facility] form to get her Zoloft medication with no cost.  Pt. Stated she will stop by tomorrow and fill out the form.

## 2018-08-09 NOTE — Telephone Encounter (Signed)
Call received from Tammy Sours, [place] noting that the patient has not completed the referral process for [facility] the [place] and has not started treatment.   She has completed the medicaid application

## 2018-08-10 ENCOUNTER — Telehealth (HOSPITAL_COMMUNITY): Payer: Self-pay | Admitting: Pharmacy Technician

## 2018-08-10 NOTE — Telephone Encounter (Signed)
Called and spoke with [employer]. Gave information needed to ship Eliquis to her. Patient should receive Eliquis on Monday 08/13/2018.  Charlann Boxer, CPhT

## 2018-08-10 NOTE — Telephone Encounter (Signed)
Patient was approved through [employer] for Eliquis.  Effective Dates: 08/06/2018-08/06/2019. Called to get the ok from patient that we go ahead and have [employer] send the prescription to her. Left voicemail. Will attempt to call patient again before the end of the day. Will go ahead and call [employer] and give them the ok since her address is the same in Epic as in her packet for assistance.  [employer] needs height, weight and to go over her med list with [place] before mailing.  [phone].  Charlann Boxer, CPhT

## 2018-08-14 ENCOUNTER — Telehealth (HOSPITAL_COMMUNITY): Payer: Self-pay

## 2018-08-14 MED FILL — SPIRONOLACTONE 25 MG TABLET: 25 | 30 days supply | Qty: 15 | Fill #1

## 2018-08-14 MED FILL — LOSARTAN POTASSIUM 25 MG TA: 25 | 30 days supply | Qty: 15 | Fill #1

## 2018-08-14 NOTE — Telephone Encounter (Signed)
I called Sabrina Holland to schedule an appointment. She stated she would be available on Thursday morning so we agreed to meet at 10:30.

## 2018-08-15 ENCOUNTER — Telehealth (HOSPITAL_COMMUNITY): Payer: Self-pay | Admitting: Licensed Clinical Social Worker

## 2018-08-15 NOTE — Telephone Encounter (Signed)
CSW informed by community paramedic that patient had received her first shipment of Eliquis from [employer] but that the package was taken from her doorstep.  The package was then found in the parking lot of her apartment complex but with one of the bottles missing so does not have full 90 day supply of her medication.  CSW called [facility] to inquire about options.  They will forward concern to supervisors but anticipate being able to send out an additional bottle of medication to the patient.  CSW left pt a message to inform and to inquire about progress with rehab programs that [place] had connected her with  CSW will continue to follow and assist as needed  Jorge Ny, [facility] Desk#: [phone] Cell#: [phone]

## 2018-08-16 ENCOUNTER — Other Ambulatory Visit (HOSPITAL_COMMUNITY): Payer: Self-pay

## 2018-08-16 NOTE — Progress Notes (Signed)
I arrived at [name]'s apartment this morning for our scheduled appointment. She did not answer the door when I knocked several times and did not answer he phone when I called her. I knocked loudly again after calling and she promptly called me back and began expressing her displeasure with me knocking so loudly because she was sleeping. I reminded her of our scheduled appointment and she stated she did not care and did not need my help. I asked if she knew how to take her medications and she told me she has been managing her medications since before anybody was coming out to help her. I left her pillbox and medication on her ottoman and left. I will relay this information to [place], paramedic, so she can address this upon her return from vacation.

## 2018-08-22 ENCOUNTER — Other Ambulatory Visit (HOSPITAL_COMMUNITY): Payer: Self-pay

## 2018-08-22 ENCOUNTER — Telehealth: Payer: Self-pay | Admitting: Nurse Practitioner

## 2018-08-22 ENCOUNTER — Telehealth: Payer: Self-pay

## 2018-08-22 NOTE — Telephone Encounter (Signed)
As per Abelino Derrick, [employer] [facility] , they have spoken to the patient and are now waiting for a call back from her.

## 2018-08-22 NOTE — Progress Notes (Signed)
Paramedicine Encounter    Patient ID: Sabrina Holland, female    DOB: [birth year], 48 y.o.   MRN: [id number]    Patient Care Team: Gildardo Pounds, NP as PCP - General (Nurse Practitioner) Sanda Klein, MD as PCP - Cardiology (Cardiology)  Patient Active Problem List   Diagnosis Date Noted  . Palliative care by specialist   . Goals of care, counseling/discussion   . Acute respiratory failure ([place])   . Endotracheally intubated   . Torsades de pointes ([place])   . Cardiac arrest ([place])   . Atrial flutter ([place]) 07/03/2018  . Elevated LFTs 04/18/2018  . Malnutrition of moderate degree 02/27/2018  . Dyspnea 02/26/2018  . Acute on chronic systolic and diastolic heart failure, NYHA class 3 ([place]) 01/20/2018  . Cocaine abuse ([place]) 12/19/2017  . Mixed hyperlipidemia 12/19/2017  . COPD exacerbation ([place])   . Acute on chronic respiratory failure with hypoxemia ([place]) 12/03/2017  . Cardiomyopathy ([place]) 07/04/2017  . COPD (chronic obstructive pulmonary disease) ([place])   . Chronic combined systolic and diastolic heart failure ([place])   . Anemia 06/20/2017  . Polysubstance abuse ([place]) 11/26/2011  . TIA (transient ischemic attack) 11/25/2011  . Essential hypertension 11/25/2011  . Sinusitis 11/25/2011  . Vitamin B 12 deficiency 07/18/2011  . History of breast cancer 02/23/2011    Current Outpatient Medications:  .  albuterol (VENTOLIN HFA) 108 (90 Base) MCG/ACT inhaler, Inhale 2 puffs into the lungs every 6 (six) hours as needed for wheezing or shortness of breath (cough). Will pick up scripts tomorrow 7-9., Disp: 18 g, Rfl: 1 .  amiodarone (PACERONE) 200 MG tablet, Take 1 tablet (200 mg total) by mouth 2 (two) times daily., Disp: 60 tablet, Rfl: 0 .  apixaban (ELIQUIS) 5 MG TABS tablet, Take 1 tablet (5 mg total) by mouth 2 (two) times daily. Patient will pick up scripts today., Disp: 180 tablet, Rfl: 3 .  budesonide-formoterol (SYMBICORT) 80-4.5 MCG/ACT inhaler, Inhale 2 puffs into the  lungs 2 (two) times daily., Disp: 1 Inhaler, Rfl: 6 .  diclofenac sodium (VOLTAREN) 1 % GEL, Apply 2 g topically 4 (four) times daily. (Patient not taking: Reported on 08/09/2018), Disp: 100 g, Rfl: 1 .  digoxin (LANOXIN) 0.125 MG tablet, Take 1 tablet (0.125 mg total) by mouth daily., Disp: 30 tablet, Rfl: 0 .  ferrous sulfate 325 (65 FE) MG tablet, Take 1 tablet (325 mg total) by mouth 2 (two) times daily with a meal., Disp: 60 tablet, Rfl: 0 .  fluticasone (FLONASE) 50 MCG/ACT nasal spray, Place 2 sprays into both nostrils daily., Disp: 16 g, Rfl: 0 .  folic acid (FOLVITE) 1 MG tablet, Take 1 tablet (1 mg total) by mouth daily., Disp: 30 tablet, Rfl: 0 .  furosemide (LASIX) 20 MG tablet, Take 2 tablets (40 mg total) by mouth daily., Disp: 60 tablet, Rfl: 0 .  lidocaine (LIDODERM) 5 %, Place 1 patch onto the skin daily. Remove & Discard patch within 12 hours or as directed by MD (Patient not taking: Reported on 07/25/2018), Disp: 30 patch, Rfl: 0 .  losartan (COZAAR) 25 MG tablet, Take 0.5 tablets (12.5 mg total) by mouth daily., Disp: 45 tablet, Rfl: 3 .  megestrol (MEGACE) 400 MG/10ML suspension, Take 10 mLs (400 mg total) by mouth 2 (two) times daily., Disp: 240 mL, Rfl: 0 .  mouth rinse LIQD solution, 15 mLs by Mouth Rinse route 2 (two) times daily. (Patient not taking: Reported on 08/09/2018), Disp: 44 mL, Rfl: 0 .  phenol (CHLORASEPTIC) 1.4 % LIQD, Use as directed 1 spray in the mouth or throat as needed for throat irritation / pain., Disp: 20 mL, Rfl: 0 .  sertraline (ZOLOFT) 50 MG tablet, Take 1 tablet (50 mg total) by mouth daily. (Patient not taking: Reported on 08/09/2018), Disp: 30 tablet, Rfl: 3 .  spironolactone (ALDACTONE) 25 MG tablet, Take 0.5 tablets (12.5 mg total) by mouth daily., Disp: 30 tablet, Rfl: 0 .  thiamine 100 MG tablet, Take 1 tablet (100 mg total) by mouth daily. (Patient not taking: Reported on 08/09/2018), Disp: 30 tablet, Rfl: 0 .  tiotropium (SPIRIVA HANDIHALER) 18 MCG  inhalation capsule, Place 1 capsule (18 mcg total) into inhaler and inhale daily. (Patient not taking: Reported on 08/09/2018), Disp: 30 capsule, Rfl: 11 Allergies  Allergen Reactions  . Penicillins Other (See Comments)    Pt had slight throat swelling Has patient had a PCN reaction causing immediate rash, facial/tongue/throat swelling, SOB or lightheadedness with hypotension: yes Has patient had a PCN reaction causing severe rash involving mucus membranes or skin necrosis: No Has patient had a PCN reaction that required hospitalization No Has patient had a PCN reaction occurring within the last 10 years: No If all of the above answers are "NO", then may proceed with Cephalosporin use.      Social History   Socioeconomic History  . Marital status: Single    Spouse name: Not on file  . Number of children: 4  . Years of education: Not on file  . Highest education level: Not on file  Occupational History  . Occupation: trying to get disability    Employer: [employer]  Social Needs  . Financial resource strain: Not on file  . Food insecurity    Worry: Not on file    Inability: Not on file  . Transportation needs    Medical: Not on file    Non-medical: Not on file  Tobacco Use  . Smoking status: Former Smoker    Packs/day: 5.00    Years: 30.00    Pack years: 150.00    Types: Cigarettes  . Smokeless tobacco: Never Used  . Tobacco comment: 1 or 2 cigarettes a day- approx 1 pack week  Substance and Sexual Activity  . Alcohol use: Not Currently    Alcohol/week: 12.0 standard drinks    Types: 12 Cans of beer per week    Comment: last drink was 5/19  . Drug use: Yes    Types: Marijuana, Cocaine    Comment: daily marijuana; last use of cocaine 05/02/18 but selling it  . Sexual activity: Not Currently    Birth control/protection: Surgical  Lifestyle  . Physical activity    Days per week: Not on file    Minutes per session: Not on file  . Stress: Not on file  Relationships   . Social Herbalist on phone: Not on file    Gets together: Not on file    Attends religious service: Not on file    Active member of club or organization: Not on file    Attends meetings of clubs or organizations: Not on file    Relationship status: Not on file  . Intimate partner violence    Fear of current or ex partner: Not on file    Emotionally abused: Not on file    Physically abused: Not on file    Forced sexual activity: Not on file  Other Topics Concern  . Not on file  Social  History Narrative   ** Merged History Encounter **        Physical Exam Pulmonary:     Effort: Pulmonary effort is normal. No respiratory distress.     Breath sounds: No wheezing or rales.  Abdominal:     General: Abdomen is flat.  Musculoskeletal:     Right lower leg: No edema.     Left lower leg: No edema.  Skin:    General: Skin is warm and dry.         Future Appointments  Date Time Provider [place]  09/04/2018 11:30 AM [facility] PA/NP [facility] None     BP 126/70 (BP Location: Right Arm, Patient Position: Sitting, Cuff Size: Normal)   Pulse 86   Temp 98.6 F (37 C)   Wt 128 lb (58.1 kg)   LMP 02/18/2015 (Exact Date) Comment: MAYBE ONCE A YEAR  SpO2 98%   BMI 21.30 kg/m   Weight yesterday-128 Last visit weight-127  ATF pt CAO x4 standing outside.  She said that she "probaly messed up her pill box, because she told Bertell Maria that she can do it herself", she later realized that she couldn't read the pill bottles.  Sabrina Holland had several furosemide pills in each space (3-4) and extra digoxin.  She said that she "half way took her meds since then because she knew that it was too many pills in her box".  She denies sob, chest pain and dizziness. We discussed her taking sertraline (zoloft), she said that she refused to take it due to the side effects.  After we discussed the pros and cons, and I explained what the medication is for she agrees to take it. Pt is  currently out of it, I will f/u with [facility].  She would like an appointment with her pcp at [facility] for GYN concerns.  rx bottles verified and pill box refilled.    Medication ordered: Sertraline Thiamine Folic acid  Sharyn Brilliant, EMT Paramedic [phone] 08/22/2018    ACTION: Home visit completed

## 2018-08-22 NOTE — Telephone Encounter (Signed)
Pt would like to be called back concerning medication side affects, Informed pt that I could get triage nurse on the phone to discuss this side effects, but pt insisted on speaking with Dr.Zelda..please follow up

## 2018-08-23 ENCOUNTER — Telehealth (HOSPITAL_COMMUNITY): Payer: Self-pay

## 2018-08-23 ENCOUNTER — Other Ambulatory Visit: Payer: Self-pay

## 2018-08-23 ENCOUNTER — Other Ambulatory Visit: Payer: Self-pay | Admitting: Adult Health Nurse Practitioner

## 2018-08-23 DIAGNOSIS — Z515 Encounter for palliative care: Secondary | ICD-10-CM | POA: Diagnosis not present

## 2018-08-23 MED FILL — ?SERTRALINE HCL 25MG TABS: 25 | 30 days supply | Qty: 60 | Fill #0

## 2018-08-23 NOTE — Telephone Encounter (Signed)
Call received from [place], EMT noting that the patient would like an appointment as she is experiencing vaginal discharge.  Informed her that patient's PCP is out of the office and an appointment will be scheduled with another provider. [name] also noted that the patient has decided to take her sertraline and she can pick it up for the patient today.  As per Kelly/[facility], they have a [facility] application on file and can fill the prescription for the patient today. Appointment scheduled with Dr Wynetta Emery for tomorrow - 08/24/2018 @ 1610.   This information was shared with [name] [place], EMT

## 2018-08-24 ENCOUNTER — Other Ambulatory Visit: Payer: Self-pay

## 2018-08-24 ENCOUNTER — Telehealth (HOSPITAL_COMMUNITY): Payer: Self-pay | Admitting: Licensed Clinical Social Worker

## 2018-08-24 ENCOUNTER — Ambulatory Visit: Payer: Self-pay | Attending: Internal Medicine | Admitting: Internal Medicine

## 2018-08-24 DIAGNOSIS — N898 Other specified noninflammatory disorders of vagina: Secondary | ICD-10-CM

## 2018-08-24 NOTE — Telephone Encounter (Signed)
I called Sabrina Holland to notify her that she has an appointment with [facility] tomorrow at 4 oclock.  She said that she'll be there.

## 2018-08-24 NOTE — Progress Notes (Signed)
Virtual Visit via Telephone Note Due to current restrictions/limitations of in-office visits due to the COVID-19 pandemic, this scheduled clinical appointment was converted to a telehealth visit  I connected with Salem Caster Baray on 08/24/18 at 5:05 p.m by telephone and verified that I am speaking with the correct person using two identifiers. I am in my office.  The patient is at home.  Only the patient and myself participated in this encounter.  I discussed the limitations, risks, security and privacy concerns of performing an evaluation and management service by telephone and the availability of in person appointments. I also discussed with the patient that there may be a patient responsible charge related to this service. The patient expressed understanding and agreed to proceed.   History of Present Illness: This is UC visit for vaginal dischg Pt c/o greenish vaginal dischg x 4 days.  No vaginal itching or irritation. Sexually active with one partner.  Patient states that he admits that he was with another female prior to them getting back together.   Observations/Objective: No direct observation done  Assessment and Plan:  1. Vaginal discharge Patient came by earlier today and did a vaginal self swab.  This was sent for STD screen.  Patient advised she will be informed of the results when they come back in a few days.  In the meantime I recommend using condoms - Cervicovaginal ancillary only   Follow Up Instructions:    I discussed the assessment and treatment plan with the patient. The patient was provided an opportunity to ask questions and all were answered. The patient agreed with the plan and demonstrated an understanding of the instructions.   The patient was advised to call back or seek an in-person evaluation if the symptoms worsen or if the condition fails to improve as anticipated.  I provided 3 minutes of non-face-to-face time during this encounter.   Karle Plumber, MD

## 2018-08-24 NOTE — Progress Notes (Signed)
Sabrina Holland Consult Note Telephone: [phone]  Fax: [phone]  PATIENT NAME: Sabrina Holland DOB: [birth year] MRN: [id number]  PRIMARY CARE PROVIDER:   Gildardo Pounds, NP  REFERRING PROVIDER:  Gildardo Pounds, NP [place],  [place] [address]  RESPONSIBLE PARTY:   Self [phone] Sabrina Holland, son, [phone]     RECOMMENDATIONS and PLAN:  1.  Depression.  Patient was prescribed zoloft and had not started taking it.  States that RN from paramedic, who is helping with medication management, has encouraged her to take it and went over side effects with her.  She does state that her depression has gotten better and that she will start taking the zoloft.  Patient does appear to be in better spirits today and has a positive outlook.  She states that she feels like she is in a better place.  Her family is helping her and she does express some grief over loss of independence but works on ways in which she can help others.  Have encouraged her to keep up what she is doing and to stay positive.  2.  CHF.  Patient states that she is feeling stronger.  States that she takes her medications but has not needed to use her inhalers.  She walks everyday and takes her inhalers with her just in case she needs them.  No complaints of SOB, cough, chest pain.  Encouraged to continue her medications as prescribed and to continue her daily walks.  3.  Goals of care.  Went over MOST form and filled it out.  Patient feels like she has been given a second chance and wants to be a full code to be here as long as she can with her grandchildren.  MOST scanned and uploaded to [facility] and original left with patient.  Have appointment in 6 months.     I spent 45 minutes providing this consultation,  from 12:00 to 12:45. More than 50% of the time in this consultation was spent coordinating communication.   HISTORY OF PRESENT ILLNESS:  Sabrina Holland is a 48 y.o. year old female with multiple medical problems including CHF, COPD, CKD stage 2, h/o breast cancer, cocaine addiction. Palliative Care was asked to help address goals of care.   CODE STATUS: Full code  PPS: 60% HOSPICE ELIGIBILITY/DIAGNOSIS: TBD  PHYSICAL EXAM:   General: sitting on couch in NAD Extremities: no edema, no joint deformities Skin: no rashes Neurological: Weakness but otherwise nonfocal  PAST MEDICAL HISTORY:  Past Medical History:  Diagnosis Date  . Atrial flutter ([place]) 07/03/2018  . Breast cancer, stage 2 ([place]) 02/23/2011  . Chronic combined systolic and diastolic CHF (congestive heart failure) ([place])   . CKD (chronic kidney disease), stage II   . Cocaine use   . COPD (chronic obstructive pulmonary disease) ([place])   . Homelessness   . Hypertension   . Polysubstance abuse ([place])   . Poor social situation   . TIA (transient ischemic attack) 11/25/2011   "first time" (11/25/2011)    SOCIAL HX:  Social History   Tobacco Use  . Smoking status: Former Smoker    Packs/day: 5.00    Years: 30.00    Pack years: 150.00    Types: Cigarettes  . Smokeless tobacco: Never Used  . Tobacco comment: 1 or 2 cigarettes a day- approx 1 pack week  Substance Use Topics  . Alcohol use: Not Currently    Alcohol/week: 12.0  standard drinks    Types: 12 Cans of beer per week    Comment: last drink was 5/19    ALLERGIES:  Allergies  Allergen Reactions  . Penicillins Other (See Comments)    Pt had slight throat swelling Has patient had a PCN reaction causing immediate rash, facial/tongue/throat swelling, SOB or lightheadedness with hypotension: yes Has patient had a PCN reaction causing severe rash involving mucus membranes or skin necrosis: No Has patient had a PCN reaction that required hospitalization No Has patient had a PCN reaction occurring within the last 10 years: No If all of the above answers are "NO", then may proceed with Cephalosporin use.       PERTINENT MEDICATIONS:  Outpatient Encounter Medications as of 08/23/2018  Medication Sig  . albuterol (VENTOLIN HFA) 108 (90 Base) MCG/ACT inhaler Inhale 2 puffs into the lungs every 6 (six) hours as needed for wheezing or shortness of breath (cough). Will pick up scripts tomorrow 7-9.  . amiodarone (PACERONE) 200 MG tablet Take 1 tablet (200 mg total) by mouth 2 (two) times daily.  Marland Kitchen apixaban (ELIQUIS) 5 MG TABS tablet Take 1 tablet (5 mg total) by mouth 2 (two) times daily. Patient will pick up scripts today.  . budesonide-formoterol (SYMBICORT) 80-4.5 MCG/ACT inhaler Inhale 2 puffs into the lungs 2 (two) times daily.  . diclofenac sodium (VOLTAREN) 1 % GEL Apply 2 g topically 4 (four) times daily. (Patient not taking: Reported on 08/09/2018)  . digoxin (LANOXIN) 0.125 MG tablet Take 1 tablet (0.125 mg total) by mouth daily.  . ferrous sulfate 325 (65 FE) MG tablet Take 1 tablet (325 mg total) by mouth 2 (two) times daily with a meal.  . fluticasone (FLONASE) 50 MCG/ACT nasal spray Place 2 sprays into both nostrils daily.  . folic acid (FOLVITE) 1 MG tablet Take 1 tablet (1 mg total) by mouth daily.  Marland Kitchen lidocaine (LIDODERM) 5 % Place 1 patch onto the skin daily. Remove & Discard patch within 12 hours or as directed by MD (Patient not taking: Reported on 07/25/2018)  . losartan (COZAAR) 25 MG tablet Take 0.5 tablets (12.5 mg total) by mouth daily.  . megestrol (MEGACE) 400 MG/10ML suspension Take 10 mLs (400 mg total) by mouth 2 (two) times daily.  Marland Kitchen mouth rinse LIQD solution 15 mLs by Mouth Rinse route 2 (two) times daily. (Patient not taking: Reported on 08/09/2018)  . phenol (CHLORASEPTIC) 1.4 % LIQD Use as directed 1 spray in the mouth or throat as needed for throat irritation / pain.  Marland Kitchen sertraline (ZOLOFT) 50 MG tablet Take 1 tablet (50 mg total) by mouth daily. (Patient not taking: Reported on 08/09/2018)  . spironolactone (ALDACTONE) 25 MG tablet Take 0.5 tablets (12.5 mg total) by mouth  daily.  Marland Kitchen thiamine 100 MG tablet Take 1 tablet (100 mg total) by mouth daily. (Patient not taking: Reported on 08/09/2018)  . tiotropium (SPIRIVA HANDIHALER) 18 MCG inhalation capsule Place 1 capsule (18 mcg total) into inhaler and inhale daily. (Patient not taking: Reported on 08/09/2018)   No facility-administered encounter medications on file as of 08/23/2018.       Amy Jenetta Downer, NP

## 2018-08-24 NOTE — Telephone Encounter (Signed)
CSW called pt to check in.  CSW inquired if she has followed up on any of the rehab resources we've been connecting her with and reports she has not done so at this time.  CSW resent her number for [facility] intake line and reminded her that she has already completed phone assessment so all she has to do and set up appt for full intake.  Pt states she is not sure she has time for intensive rehab at this time as she is helping her daughter with childcare.  Reports this has been a pleasant distraction and is helping her keep her mind off of using.  CSW also inquired if pt had received the additional shipment of Eliquis since one of her bottles was taken- pt reports she has not heard anything.  CSW reached out to [facility] to inquire.  They report that this is being processed through the pharmacy and transferred me to them to discuss.  Pharmacy reports that they won't send out a one bottle shipment but that pt has been approved to get a full 90 day supply early when she needs a refill- CSW updated patient.  CSW will continue to follow and assist as needed  Jorge Ny, [facility] Desk#: [phone] Cell#: [phone]

## 2018-08-28 ENCOUNTER — Other Ambulatory Visit: Payer: Self-pay | Admitting: Pharmacist

## 2018-08-28 MED ORDER — FOLIC ACID 1 MG PO TABS: 1.0000 mg | ORAL_TABLET | Freq: Every day | ORAL | 2 refills | Status: DC

## 2018-08-28 MED FILL — ?FOLIC ACID 1MG TAB: 1 | 30 days supply | Qty: 30 | Fill #0

## 2018-08-29 ENCOUNTER — Telehealth (HOSPITAL_COMMUNITY): Payer: Self-pay

## 2018-08-29 ENCOUNTER — Telehealth: Payer: Self-pay | Admitting: Nurse Practitioner

## 2018-08-29 ENCOUNTER — Other Ambulatory Visit (HOSPITAL_COMMUNITY): Payer: Self-pay

## 2018-08-29 NOTE — Telephone Encounter (Signed)
Spoke with Sabrina Holland in the Cytology Department they are waiting on the bacterial and candida. Per Sabrina Holland their machine has been messing up so it's taking longer than expected

## 2018-08-29 NOTE — Progress Notes (Signed)
Paramedicine Encounter    Patient ID: Sabrina Holland, female    DOB: June 28, 1970, 48 y.o.   MRN: [id number]    Patient Care Team: Gildardo Pounds, NP as PCP - General (Nurse Practitioner) Sanda Klein, MD as PCP - Cardiology (Cardiology)  Patient Active Problem List   Diagnosis Date Noted  . Palliative care by specialist   . Goals of care, counseling/discussion   . Acute respiratory failure ([place])   . Endotracheally intubated   . Torsades de pointes ([place])   . Cardiac arrest ([place])   . Atrial flutter ([place]) 07/03/2018  . Elevated LFTs 04/18/2018  . Malnutrition of moderate degree 02/27/2018  . Dyspnea 02/26/2018  . Acute on chronic systolic and diastolic heart failure, NYHA class 3 ([place]) 01/20/2018  . Cocaine abuse ([place]) 12/19/2017  . Mixed hyperlipidemia 12/19/2017  . COPD exacerbation ([place])   . Acute on chronic respiratory failure with hypoxemia ([place]) 12/03/2017  . Cardiomyopathy ([place]) 07/04/2017  . COPD (chronic obstructive pulmonary disease) ([place])   . Chronic combined systolic and diastolic heart failure ([place])   . Anemia 06/20/2017  . Polysubstance abuse ([place]) 11/26/2011  . TIA (transient ischemic attack) 11/25/2011  . Essential hypertension 11/25/2011  . Sinusitis 11/25/2011  . Vitamin B 12 deficiency 07/18/2011  . History of breast cancer 02/23/2011    Current Outpatient Medications:  .  albuterol (VENTOLIN HFA) 108 (90 Base) MCG/ACT inhaler, Inhale 2 puffs into the lungs every 6 (six) hours as needed for wheezing or shortness of breath (cough). Will pick up scripts tomorrow 7-9., Disp: 18 g, Rfl: 1 .  amiodarone (PACERONE) 200 MG tablet, Take 1 tablet (200 mg total) by mouth 2 (two) times daily., Disp: 60 tablet, Rfl: 0 .  apixaban (ELIQUIS) 5 MG TABS tablet, Take 1 tablet (5 mg total) by mouth 2 (two) times daily. Patient will pick up scripts today., Disp: 180 tablet, Rfl: 3 .  budesonide-formoterol (SYMBICORT) 80-4.5 MCG/ACT inhaler, Inhale 2 puffs into the  lungs 2 (two) times daily., Disp: 1 Inhaler, Rfl: 6 .  diclofenac sodium (VOLTAREN) 1 % GEL, Apply 2 g topically 4 (four) times daily. (Patient not taking: Reported on 08/09/2018), Disp: 100 g, Rfl: 1 .  digoxin (LANOXIN) 0.125 MG tablet, Take 1 tablet (0.125 mg total) by mouth daily., Disp: 30 tablet, Rfl: 0 .  ferrous sulfate 325 (65 FE) MG tablet, Take 1 tablet (325 mg total) by mouth 2 (two) times daily with a meal., Disp: 60 tablet, Rfl: 0 .  fluticasone (FLONASE) 50 MCG/ACT nasal spray, Place 2 sprays into both nostrils daily., Disp: 16 g, Rfl: 0 .  folic acid (FOLVITE) 1 MG tablet, Take 1 tablet (1 mg total) by mouth daily., Disp: 30 tablet, Rfl: 2 .  furosemide (LASIX) 20 MG tablet, Take 2 tablets (40 mg total) by mouth daily., Disp: 60 tablet, Rfl: 0 .  lidocaine (LIDODERM) 5 %, Place 1 patch onto the skin daily. Remove & Discard patch within 12 hours or as directed by MD (Patient not taking: Reported on 07/25/2018), Disp: 30 patch, Rfl: 0 .  losartan (COZAAR) 25 MG tablet, Take 0.5 tablets (12.5 mg total) by mouth daily., Disp: 45 tablet, Rfl: 3 .  megestrol (MEGACE) 400 MG/10ML suspension, Take 10 mLs (400 mg total) by mouth 2 (two) times daily., Disp: 240 mL, Rfl: 0 .  mouth rinse LIQD solution, 15 mLs by Mouth Rinse route 2 (two) times daily. (Patient not taking: Reported on 08/09/2018), Disp: 44 mL, Rfl: 0 .  phenol (CHLORASEPTIC) 1.4 % LIQD, Use as directed 1 spray in the mouth or throat as needed for throat irritation / pain., Disp: 20 mL, Rfl: 0 .  sertraline (ZOLOFT) 50 MG tablet, Take 1 tablet (50 mg total) by mouth daily. (Patient not taking: Reported on 08/09/2018), Disp: 30 tablet, Rfl: 3 .  spironolactone (ALDACTONE) 25 MG tablet, Take 0.5 tablets (12.5 mg total) by mouth daily., Disp: 30 tablet, Rfl: 0 .  thiamine 100 MG tablet, Take 1 tablet (100 mg total) by mouth daily. (Patient not taking: Reported on 08/09/2018), Disp: 30 tablet, Rfl: 0 .  tiotropium (SPIRIVA HANDIHALER) 18 MCG  inhalation capsule, Place 1 capsule (18 mcg total) into inhaler and inhale daily. (Patient not taking: Reported on 08/09/2018), Disp: 30 capsule, Rfl: 11 Allergies  Allergen Reactions  . Penicillins Other (See Comments)    Pt had slight throat swelling Has patient had a PCN reaction causing immediate rash, facial/tongue/throat swelling, SOB or lightheadedness with hypotension: yes Has patient had a PCN reaction causing severe rash involving mucus membranes or skin necrosis: No Has patient had a PCN reaction that required hospitalization No Has patient had a PCN reaction occurring within the last 10 years: No If all of the above answers are "NO", then may proceed with Cephalosporin use.      Social History   Socioeconomic History  . Marital status: Single    Spouse name: Not on file  . Number of children: 4  . Years of education: Not on file  . Highest education level: Not on file  Occupational History  . Occupation: trying to get disability    Employer: [employer]  Social Needs  . Financial resource strain: Not on file  . Food insecurity    Worry: Not on file    Inability: Not on file  . Transportation needs    Medical: Not on file    Non-medical: Not on file  Tobacco Use  . Smoking status: Former Smoker    Packs/day: 5.00    Years: 30.00    Pack years: 150.00    Types: Cigarettes  . Smokeless tobacco: Never Used  . Tobacco comment: 1 or 2 cigarettes a day- approx 1 pack week  Substance and Sexual Activity  . Alcohol use: Not Currently    Alcohol/week: 12.0 standard drinks    Types: 12 Cans of beer per week    Comment: last drink was 5/19  . Drug use: Yes    Types: Marijuana, Cocaine    Comment: daily marijuana; last use of cocaine 05/02/18 but selling it  . Sexual activity: Not Currently    Birth control/protection: Surgical  Lifestyle  . Physical activity    Days per week: Not on file    Minutes per session: Not on file  . Stress: Not on file  Relationships   . Social Herbalist on phone: Not on file    Gets together: Not on file    Attends religious service: Not on file    Active member of club or organization: Not on file    Attends meetings of clubs or organizations: Not on file    Relationship status: Not on file  . Intimate partner violence    Fear of current or ex partner: Not on file    Emotionally abused: Not on file    Physically abused: Not on file    Forced sexual activity: Not on file  Other Topics Concern  . Not on file  Social  History Narrative   ** Merged History Encounter **        Physical Exam Pulmonary:     Effort: Pulmonary effort is normal. No respiratory distress.     Breath sounds: No wheezing or rales.  Musculoskeletal:        General: No swelling.     Right lower leg: No edema.     Left lower leg: No edema.  Skin:    General: Skin is warm and dry.         Future Appointments  Date Time Provider [place]  09/04/2018 11:30 AM [facility] PA/NP [facility] None     BP 118/80 (BP Location: Right Arm, Patient Position: Sitting, Cuff Size: Normal)   Pulse (!) 101   Temp 98.6 F (37 C)   Wt 125 lb (56.7 kg)   LMP 02/18/2015 (Exact Date) Comment: MAYBE ONCE A YEAR  SpO2 98%   BMI 20.80 kg/m   Weight yesterday-126 Last visit weight-128  ATF pt CAO x4 sitting on the stairs outside of her apartment; she met me outside for today's visit.  She missed her meds on Sunday because she was out of town.  Pt denies sob, chest pain and dizziness.  Sabrina Holland said that she was waiting for the test results to come back from the exam that she had the other day.  rx bottles verified and pill box refilled.    Medication ordered: None  Tocara Mennen, EMT Paramedic [phone] 08/29/2018    ACTION: Home visit completed

## 2018-08-29 NOTE — Telephone Encounter (Signed)
Sabrina Holland text me this morning asking to push back [facility] visit 9am to 11

## 2018-08-29 NOTE — Telephone Encounter (Signed)
New Message   Pt is calling to check on the status of her lab results. Please f/u

## 2018-08-29 NOTE — Telephone Encounter (Signed)
Will contact patient when results are back.

## 2018-08-29 NOTE — Telephone Encounter (Signed)
Attempted to inform patient results are not in yet however patient requested to speak to clinical staff because she is getting mixed messages. Please follow up.

## 2018-08-30 LAB — CERVICOVAGINAL ANCILLARY ONLY
Bacterial vaginitis: POSITIVE — AB
Candida vaginitis: NEGATIVE
Chlamydia: NEGATIVE
Neisseria Gonorrhea: NEGATIVE
Trichomonas: POSITIVE — AB

## 2018-08-30 MED ORDER — METRONIDAZOLE 500 MG PO TABS: 500.0000 mg | ORAL_TABLET | Freq: Two times a day (BID) | ORAL | 0 refills | Status: DC

## 2018-08-31 MED FILL — ?METRONIDAZOLE 500 MG TABS: 500 | 7 days supply | Qty: 14 | Fill #0

## 2018-08-31 NOTE — Telephone Encounter (Signed)
Pt contacted the office and I went over results. Pt doesn't have any questions or concerns

## 2018-09-04 ENCOUNTER — Telehealth (HOSPITAL_COMMUNITY): Payer: Self-pay

## 2018-09-04 ENCOUNTER — Ambulatory Visit (HOSPITAL_COMMUNITY)
Admission: RE | Admit: 2018-09-04 | Discharge: 2018-09-04 | Disposition: A | Discharge status: Home / Self Care | Payer: Medicaid Other | Source: Ambulatory Visit | Attending: Internal Medicine | Admitting: Internal Medicine

## 2018-09-04 ENCOUNTER — Encounter (HOSPITAL_COMMUNITY): Payer: Self-pay

## 2018-09-04 ENCOUNTER — Other Ambulatory Visit (HOSPITAL_COMMUNITY): Payer: Self-pay

## 2018-09-04 ENCOUNTER — Telehealth (HOSPITAL_COMMUNITY): Payer: Self-pay | Admitting: Pharmacy Technician

## 2018-09-04 ENCOUNTER — Other Ambulatory Visit: Payer: Self-pay

## 2018-09-04 VITALS — BP 121/79 | HR 86 | Wt 126.4 lb

## 2018-09-04 DIAGNOSIS — E785 Hyperlipidemia, unspecified: Secondary | ICD-10-CM | POA: Insufficient documentation

## 2018-09-04 DIAGNOSIS — I5022 Chronic systolic (congestive) heart failure: Secondary | ICD-10-CM | POA: Insufficient documentation

## 2018-09-04 DIAGNOSIS — Z7951 Long term (current) use of inhaled steroids: Secondary | ICD-10-CM | POA: Diagnosis not present

## 2018-09-04 DIAGNOSIS — I130 Hypertensive heart and chronic kidney disease with heart failure and stage 1 through stage 4 chronic kidney disease, or unspecified chronic kidney disease: Secondary | ICD-10-CM | POA: Diagnosis not present

## 2018-09-04 DIAGNOSIS — I5042 Chronic combined systolic (congestive) and diastolic (congestive) heart failure: Secondary | ICD-10-CM | POA: Diagnosis present

## 2018-09-04 DIAGNOSIS — Z8674 Personal history of sudden cardiac arrest: Secondary | ICD-10-CM | POA: Insufficient documentation

## 2018-09-04 DIAGNOSIS — Z7901 Long term (current) use of anticoagulants: Secondary | ICD-10-CM | POA: Diagnosis not present

## 2018-09-04 DIAGNOSIS — Z8673 Personal history of transient ischemic attack (TIA), and cerebral infarction without residual deficits: Secondary | ICD-10-CM | POA: Insufficient documentation

## 2018-09-04 DIAGNOSIS — F1910 Other psychoactive substance abuse, uncomplicated: Secondary | ICD-10-CM

## 2018-09-04 DIAGNOSIS — I10 Essential (primary) hypertension: Secondary | ICD-10-CM

## 2018-09-04 DIAGNOSIS — F17210 Nicotine dependence, cigarettes, uncomplicated: Secondary | ICD-10-CM | POA: Diagnosis not present

## 2018-09-04 DIAGNOSIS — I480 Paroxysmal atrial fibrillation: Secondary | ICD-10-CM | POA: Insufficient documentation

## 2018-09-04 DIAGNOSIS — Z853 Personal history of malignant neoplasm of breast: Secondary | ICD-10-CM | POA: Diagnosis not present

## 2018-09-04 DIAGNOSIS — N182 Chronic kidney disease, stage 2 (mild): Secondary | ICD-10-CM | POA: Diagnosis not present

## 2018-09-04 DIAGNOSIS — J449 Chronic obstructive pulmonary disease, unspecified: Secondary | ICD-10-CM | POA: Diagnosis not present

## 2018-09-04 DIAGNOSIS — F1410 Cocaine abuse, uncomplicated: Secondary | ICD-10-CM | POA: Diagnosis not present

## 2018-09-04 DIAGNOSIS — Z79899 Other long term (current) drug therapy: Secondary | ICD-10-CM | POA: Insufficient documentation

## 2018-09-04 DIAGNOSIS — Z825 Family history of asthma and other chronic lower respiratory diseases: Secondary | ICD-10-CM | POA: Diagnosis not present

## 2018-09-04 DIAGNOSIS — Z720 Tobacco use: Secondary | ICD-10-CM

## 2018-09-04 DIAGNOSIS — I469 Cardiac arrest, cause unspecified: Secondary | ICD-10-CM

## 2018-09-04 LAB — BASIC METABOLIC PANEL
Anion gap: 11 (ref 5–15)
BUN: 16 mg/dL (ref 6–20)
CO2: 26 mmol/L (ref 22–32)
Calcium: 9.4 mg/dL (ref 8.9–10.3)
Chloride: 100 mmol/L (ref 98–111)
Creatinine, Ser: 1.03 mg/dL — ABNORMAL HIGH (ref 0.44–1.00)
GFR calc Af Amer: >60 mL/min (ref >60)
GFR calc non Af Amer: >60 mL/min (ref >60)
Glucose, Bld: 86 mg/dL (ref 70–99)
Potassium: 3.9 mmol/L (ref 3.5–5.1)
Sodium: 137 mmol/L (ref 135–145)

## 2018-09-04 MED ORDER — SACUBITRIL-VALSARTAN 24-26 MG PO TABS: 1.0000 | ORAL_TABLET | Freq: Two times a day (BID) | ORAL | 3 refills | Status: DC

## 2018-09-04 MED FILL — ENTRESTO 24 MG-26 MG TABLET: 24-26 | 30 days supply | Qty: 60 | Fill #0

## 2018-09-04 NOTE — Progress Notes (Signed)
Waiting on 24/26 samples from drug rep, will contact [facility] paramed when they arrive

## 2018-09-04 NOTE — Progress Notes (Addendum)
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  She had a normal echocardiogram in 2013 as part of a work-up for TIA. In 06/2017  admitted with SOB and initially had left AMA. However, she returned and was found to have severe CHF with EF 25-30%, grade 3 DD. She was diuresed and started on guideline directed therapy. She has been following with Dr. Recardo Evangelist but therapy limited by her social situation and recurrent cocaine use. Last echo 11/2017 showed EF 25-30%, mild MR, mild LAE, PASP 57mmHg. She was admitted in 02/2018 for recurrent HF. She was not taking Lasix as prescribed due to frequent urination at work. She refused coronary CT or cath at that time She has subsequently followed up in telehealth with adjustment in meds. Dr. Sallyanne Kuster has suggested avoidance of beta blocker given continued cocaine use.   Presented to [facility]/03/2018 with increased SOB and chest tightness. +  For cocaine on admit. VF arrest on 6/19 with defibrillation x1 and intubation.ECHO showed severely reduced EF at 15%.  Treated for PNA.Had [place] wit normal cors. Discharged to home on 07/12/2018.   Today she returns for HF follow up.Overall feeling fine. Denies SOB/PND/Orthopnea. Does admit she has to take her time walking. Appetite ok. No fever or chills. Weight at home 126-127 pounds. Says she has used twice since the last visit.  Smoking 2-3 cigarettes per day. Drinking 40 ounces of beer a week.  Taking all medications. Followed by HF Paramedicine. Lives with her daughter.   Echo 07/05/18 EF 15% RV moderately to severely down  LHC 07/10/18  normal cors.    ROS: All systems negative except as listed in HPI, PMH and Problem List.  SH:  Social History   Socioeconomic History  . Marital status: Single    Spouse name: Not on file  . Number of children: 4  . Years of education: Not on file  . Highest education level: Not on file  Occupational History  . Occupation: trying to get disability    Employer: [employer]  Social Needs  . Financial resource strain: Not on file  . Food insecurity    Worry: Not on file    Inability: Not on file  . Transportation needs    Medical: Not on file    Non-medical: Not on file  Tobacco Use  . Smoking status: Former Smoker    Packs/day: 5.00    Years: 30.00    Pack years: 150.00    Types: Cigarettes  . Smokeless tobacco: Never Used  . Tobacco comment: 1 or 2 cigarettes a day- approx 1 pack week  Substance and Sexual Activity  . Alcohol use: Not Currently    Alcohol/week: 12.0 standard drinks    Types: 12 Cans of beer per week    Comment: last drink was 5/19  . Drug use: Yes    Types: Marijuana, Cocaine    Comment: daily marijuana; last use of cocaine 05/02/18 but selling it  . Sexual activity: Not Currently    Birth control/protection: Surgical  Lifestyle  . Physical activity    Days per week: Not on file    Minutes per session: Not on file  . Stress: Not on file  Relationships  . Social Herbalist on phone: Not on file    Gets together: Not on file    Attends religious service: Not on file    Active member of club or organization: Not on file    Attends meetings of clubs or organizations: Not on file    Relationship status: Not on file  . Intimate partner violence    Fear of current or ex partner: Not on file    Emotionally abused: Not on file    Physically abused: Not on file    Forced sexual activity: Not on file  Other Topics Concern  . Not on file  Social History Narrative   ** Merged History Encounter **        FH:  Family History  Problem Relation Age of  Onset  . COPD Father   . Sickle cell anemia Cousin        maternal cousin    Past Medical History:  Diagnosis Date  . Atrial flutter ([place]) 07/03/2018  . Breast cancer, stage 2 ([place]) 02/23/2011  . Chronic combined systolic and diastolic CHF (congestive heart failure) ([place])   . CKD (chronic kidney disease), stage II   . Cocaine use   . COPD (chronic obstructive pulmonary disease) ([place])   . Homelessness   . Hypertension   . Polysubstance abuse ([place])   . Poor social situation   . TIA (transient ischemic attack) 11/25/2011   "first time" (11/25/2011)    Current Outpatient Medications  Medication Sig Dispense Refill  . albuterol (VENTOLIN HFA) 108 (90 Base) MCG/ACT inhaler Inhale 2 puffs into the lungs every 6 (six) hours as needed for wheezing or shortness of breath (cough). Will pick up scripts tomorrow 7-9. 18 g 1  . amiodarone (PACERONE) 200 MG tablet Take 1 tablet (200 mg total) by mouth 2 (two) times daily. 60 tablet 0  . apixaban (ELIQUIS) 5 MG TABS tablet Take 1 tablet (5 mg total) by mouth 2 (two) times daily. Patient will pick up scripts today. 180 tablet 3  . budesonide-formoterol (SYMBICORT) 80-4.5 MCG/ACT inhaler Inhale 2 puffs into the lungs 2 (two) times daily. 1 Inhaler 6  . diclofenac sodium (VOLTAREN) 1 % GEL Apply 2 g topically 4 (four) times daily. 100 g 1  . digoxin (LANOXIN) 0.125 MG tablet Take 1 tablet (0.125 mg total) by mouth daily. 30 tablet 0  . ferrous sulfate 325 (65 FE) MG tablet Take 1 tablet (325 mg total) by mouth 2 (two) times daily with a meal. 60 tablet 0  . fluticasone (FLONASE) 50 MCG/ACT nasal spray Place 2 sprays into both nostrils daily. 16 g 0  . folic acid (FOLVITE) 1 MG tablet Take 1 tablet (1 mg total) by mouth daily. 30 tablet 2  . furosemide (LASIX) 20 MG tablet Take 2 tablets (40 mg total) by mouth daily. 60 tablet 0  . lidocaine (LIDODERM) 5 % Place 1 patch onto the skin daily. Remove & Discard patch within 12 hours or as directed by MD 30  patch 0  . losartan (COZAAR) 25 MG tablet Take 0.5 tablets (12.5 mg total) by mouth daily. 45 tablet 3  . megestrol (MEGACE) 400 MG/10ML suspension Take 10 mLs (400 mg total) by mouth 2 (two) times daily. 240 mL 0  . metroNIDAZOLE (FLAGYL) 500 MG tablet Take 1 tablet (500 mg total) by mouth 2 (two) times daily. 14 tablet 0  . mouth rinse LIQD solution 15 mLs by Mouth Rinse route  2 (two) times daily. 44 mL 0  . phenol (CHLORASEPTIC) 1.4 % LIQD Use as directed 1 spray in the mouth or throat as needed for throat irritation / pain. 20 mL 0  . sertraline (ZOLOFT) 50 MG tablet Take 1 tablet (50 mg total) by mouth daily. 30 tablet 3  . spironolactone (ALDACTONE) 25 MG tablet Take 0.5 tablets (12.5 mg total) by mouth daily. 30 tablet 0  . thiamine 100 MG tablet Take 1 tablet (100 mg total) by mouth daily. 30 tablet 0  . tiotropium (SPIRIVA HANDIHALER) 18 MCG inhalation capsule Place 1 capsule (18 mcg total) into inhaler and inhale daily. 30 capsule 11   No current facility-administered medications for this encounter.     Vitals:   09/04/18 1143  BP: 121/79  Pulse: 86  SpO2: 97%  Weight: 57.3 kg (126 lb 6.4 oz)   Wt Readings from Last 3 Encounters:  09/04/18 57.3 kg (126 lb 6.4 oz)  08/29/18 56.7 kg (125 lb)  08/22/18 58.1 kg (128 lb)    PHYSICAL EXAM: General:  Well appearing. No resp difficulty HEENT: normal Neck: supple. no JVD. Carotids 2+ bilat; no bruits. No lymphadenopathy or thryomegaly appreciated. Cor: PMI nondisplaced. Regular rate & rhythm. No rubs, gallops or murmurs. Lungs: clear Abdomen: soft, nontender, nondistended. No hepatosplenomegaly. No bruits or masses. Good bowel sounds. Extremities: no cyanosis, clubbing, rash, edema Neuro: alert & orientedx3, cranial nerves grossly intact. moves all 4 extremities w/o difficulty. Affect pleasant  EKG: NSR 72 bpm    ASSESSMENT & PLAN: 1. Chronic systolic HF  - Longstanding  Echo 07/05/18 EF 15% RV moderately to severely  downPersonally reviewed. Suspect due to polysubstance abuse +/- tachy-induced. - Cath 6/23 normal cors.  -MYHA II. Volume status stable.  Stop lasix and losartan once she is able to get entresto.  - Start entresto 24-26 mg twice a day. We started the patient assistance application today.  - Continue spiro 12.5 mg daily.  - Continue dig - No b-blocker yet due to shock and cocaine abuse.  - Not candidate for home inotropes or advanced therapies due to ongoing drug use -Plan to repeat ECHO in a few months.   2. Cardiac arrest VFF/TdP  - s/p Code Blue 6/19 with defib x 1  3. PAF In NSR today.  Cut back amio 200 mg daily.  - Continue eliquis 5 mg twice a day.   4. Polysubstance abuse/cocaine abuse - longstanding. Started using cocaine again. SW following and has referred to outpatient programs. She does not want inpatient treatment.   5. Tobacco Abuse.  Discussed cessation.   Discussed with HF Paramedicine and HFSW personally. Will need to continue HF Paramedicine. Follow up in 2-3 weeks.  Calyse Murcia NP-C  12:21 PM

## 2018-09-04 NOTE — Progress Notes (Signed)
Paramedicine Encounter   Patient ID: Sabrina Holland , female,   DOB: 25-Mar-1970,48 y.o.,  MRN: [id number]   Met patient in clinic today with provider Amy.   Sabrina Holland said that she feels good today; she relapsed on crack cocaine this past Saturday.  She said that this is her second time using in several weeks, "which is good for her because she was a daily user".  She's taken all of her medications this morning prior to this visit. She denies sob, chest pain and dizziness.    Amy changed a few of her medications during this visit. I fixed her pill box according to the changes that Amy made.  I have a coupon for her to get entresto from [facility]; application sent in to medication assistance program. Sabrina Holland doesn't have insurance.  She asked about the form that she need to filled out for disability during this visit.  She agrees to meet with me later this week.   Time spent with patient 45 mins   *med change: Decrease amiodarone to 257m daily Stop losartatn Stop lasix (can take PRN) Began EAviva SignsCopper, ERed [place]8/18/2020   ACTION: Home visit completed

## 2018-09-04 NOTE — Telephone Encounter (Signed)
I called Sabrina Holland to remind her of the appointment that she has with [facility] heart failure clinic today at 11:30. She said that she remembers and agrees to bring her medications to this appointment.

## 2018-09-04 NOTE — Patient Instructions (Addendum)
Labs were done today. We will call you with any ABNORMAL results. No news is good news!  EKG was completed.  Decrease Amiodarone to 200 mg (1 tab) each day.  STOP taking Losartan and STOP taking Lasix.  BEGIN taking Entresto 24/26 mg (1 tab) twice a day. You have been given samples and our Education officer, museum will reach out to you in regard to assistance programs.  Your physician recommends that you schedule a follow-up appointment in: 2 weeks with Amy.   At the [facility], you and your health needs are our priority. As part of our continuing mission to provide you with exceptional heart care, we have created designated Provider Care Teams. These Care Teams include your primary Cardiologist (physician) and Advanced Practice Providers (APPs- Physician Assistants and Nurse Practitioners) who all work together to provide you with the care you need, when you need it.   You may see any of the following providers on your designated Care Team at your next follow up: Marland Kitchen Dr Glori Bickers . Dr Loralie Champagne . Darrick Grinder, NP   Please be sure to bring in all your medications bottles to every appointment.

## 2018-09-04 NOTE — Telephone Encounter (Signed)
Met patient in clinic to complete an application for [employer] Patient Assistance Foundation (NPAF) in an effort to reduce the patient's out of pocket expense for Entresto to $0.    Application completed and faxed to [phone].   NPAF phone number for follow up is [phone].   This encounter will be updated until final determination.   Elizabeth F Hines, CPhT    

## 2018-09-07 ENCOUNTER — Other Ambulatory Visit (HOSPITAL_COMMUNITY): Payer: Self-pay

## 2018-09-07 NOTE — Progress Notes (Signed)
Re-visit  [facility]. Her pill box completed and we confirmed the next [facility] visit Tuesday.

## 2018-09-11 ENCOUNTER — Other Ambulatory Visit (HOSPITAL_COMMUNITY): Payer: Self-pay

## 2018-09-11 MED ORDER — SACUBITRIL-VALSARTAN 24-26 MG PO TABS: 1.0000 | ORAL_TABLET | Freq: Two times a day (BID) | ORAL | 3 refills | Status: DC

## 2018-09-12 ENCOUNTER — Other Ambulatory Visit (HOSPITAL_COMMUNITY): Payer: Self-pay

## 2018-09-12 NOTE — Progress Notes (Signed)
Paramedicine Encounter    Patient ID: Sabrina Holland, female    DOB: Apr 25, 1970, 48 y.o.   MRN: [id number]    Patient Care Team: Gildardo Pounds, NP as PCP - General (Nurse Practitioner) Sanda Klein, MD as PCP - Cardiology (Cardiology)  Patient Active Problem List   Diagnosis Date Noted  . Palliative care by specialist   . Goals of care, counseling/discussion   . Acute respiratory failure ([place])   . Endotracheally intubated   . Torsades de pointes ([place])   . Cardiac arrest ([place])   . Atrial flutter ([place]) 07/03/2018  . Elevated LFTs 04/18/2018  . Malnutrition of moderate degree 02/27/2018  . Dyspnea 02/26/2018  . Acute on chronic systolic and diastolic heart failure, NYHA class 3 ([place]) 01/20/2018  . Cocaine abuse ([place]) 12/19/2017  . Mixed hyperlipidemia 12/19/2017  . COPD exacerbation ([place])   . Acute on chronic respiratory failure with hypoxemia ([place]) 12/03/2017  . Cardiomyopathy ([place]) 07/04/2017  . COPD (chronic obstructive pulmonary disease) ([place])   . Chronic combined systolic and diastolic heart failure ([place])   . Anemia 06/20/2017  . Polysubstance abuse ([place]) 11/26/2011  . TIA (transient ischemic attack) 11/25/2011  . Essential hypertension 11/25/2011  . Sinusitis 11/25/2011  . Vitamin B 12 deficiency 07/18/2011  . History of breast cancer 02/23/2011    Current Outpatient Medications:  .  amiodarone (PACERONE) 200 MG tablet, Take 1 tablet (200 mg total) by mouth 2 (two) times daily., Disp: 60 tablet, Rfl: 0 .  apixaban (ELIQUIS) 5 MG TABS tablet, Take 1 tablet (5 mg total) by mouth 2 (two) times daily. Patient will pick up scripts today., Disp: 180 tablet, Rfl: 3 .  digoxin (LANOXIN) 0.125 MG tablet, Take 1 tablet (0.125 mg total) by mouth daily., Disp: 30 tablet, Rfl: 0 .  ferrous sulfate 325 (65 FE) MG tablet, Take 1 tablet (325 mg total) by mouth 2 (two) times daily with a meal., Disp: 60 tablet, Rfl: 0 .  folic acid (FOLVITE) 1 MG tablet, Take 1 tablet (1  mg total) by mouth daily., Disp: 30 tablet, Rfl: 2 .  sacubitril-valsartan (ENTRESTO) 24-26 MG, Take 1 tablet by mouth 2 (two) times daily., Disp: 180 tablet, Rfl: 3 .  sertraline (ZOLOFT) 50 MG tablet, Take 1 tablet (50 mg total) by mouth daily., Disp: 30 tablet, Rfl: 3 .  spironolactone (ALDACTONE) 25 MG tablet, Take 0.5 tablets (12.5 mg total) by mouth daily., Disp: 30 tablet, Rfl: 0 .  albuterol (VENTOLIN HFA) 108 (90 Base) MCG/ACT inhaler, Inhale 2 puffs into the lungs every 6 (six) hours as needed for wheezing or shortness of breath (cough). Will pick up scripts tomorrow 7-9., Disp: 18 g, Rfl: 1 .  budesonide-formoterol (SYMBICORT) 80-4.5 MCG/ACT inhaler, Inhale 2 puffs into the lungs 2 (two) times daily., Disp: 1 Inhaler, Rfl: 6 .  diclofenac sodium (VOLTAREN) 1 % GEL, Apply 2 g topically 4 (four) times daily., Disp: 100 g, Rfl: 1 .  fluticasone (FLONASE) 50 MCG/ACT nasal spray, Place 2 sprays into both nostrils daily., Disp: 16 g, Rfl: 0 .  lidocaine (LIDODERM) 5 %, Place 1 patch onto the skin daily. Remove & Discard patch within 12 hours or as directed by MD, Disp: 30 patch, Rfl: 0 .  megestrol (MEGACE) 400 MG/10ML suspension, Take 10 mLs (400 mg total) by mouth 2 (two) times daily., Disp: 240 mL, Rfl: 0 .  metroNIDAZOLE (FLAGYL) 500 MG tablet, Take 1 tablet (500 mg total) by mouth 2 (two) times daily., Disp: 14  tablet, Rfl: 0 .  mouth rinse LIQD solution, 15 mLs by Mouth Rinse route 2 (two) times daily., Disp: 44 mL, Rfl: 0 .  phenol (CHLORASEPTIC) 1.4 % LIQD, Use as directed 1 spray in the mouth or throat as needed for throat irritation / pain., Disp: 20 mL, Rfl: 0 .  thiamine 100 MG tablet, Take 1 tablet (100 mg total) by mouth daily., Disp: 30 tablet, Rfl: 0 .  tiotropium (SPIRIVA HANDIHALER) 18 MCG inhalation capsule, Place 1 capsule (18 mcg total) into inhaler and inhale daily., Disp: 30 capsule, Rfl: 11 Allergies  Allergen Reactions  . Penicillins Other (See Comments)    Pt had  slight throat swelling Has patient had a PCN reaction causing immediate rash, facial/tongue/throat swelling, SOB or lightheadedness with hypotension: yes Has patient had a PCN reaction causing severe rash involving mucus membranes or skin necrosis: No Has patient had a PCN reaction that required hospitalization No Has patient had a PCN reaction occurring within the last 10 years: No If all of the above answers are "NO", then may proceed with Cephalosporin use.      Social History   Socioeconomic History  . Marital status: Single    Spouse name: Not on file  . Number of children: 4  . Years of education: Not on file  . Highest education level: Not on file  Occupational History  . Occupation: trying to get disability    Employer: [employer]  Social Needs  . Financial resource strain: Not on file  . Food insecurity    Worry: Not on file    Inability: Not on file  . Transportation needs    Medical: Not on file    Non-medical: Not on file  Tobacco Use  . Smoking status: Former Smoker    Packs/day: 5.00    Years: 30.00    Pack years: 150.00    Types: Cigarettes  . Smokeless tobacco: Never Used  . Tobacco comment: 1 or 2 cigarettes a day- approx 1 pack week  Substance and Sexual Activity  . Alcohol use: Not Currently    Alcohol/week: 12.0 standard drinks    Types: 12 Cans of beer per week    Comment: last drink was 5/19  . Drug use: Yes    Types: Marijuana, Cocaine    Comment: daily marijuana; last use of cocaine 05/02/18 but selling it  . Sexual activity: Not Currently    Birth control/protection: Surgical  Lifestyle  . Physical activity    Days per week: Not on file    Minutes per session: Not on file  . Stress: Not on file  Relationships  . Social Herbalist on phone: Not on file    Gets together: Not on file    Attends religious service: Not on file    Active member of club or organization: Not on file    Attends meetings of clubs or organizations: Not  on file    Relationship status: Not on file  . Intimate partner violence    Fear of current or ex partner: Not on file    Emotionally abused: Not on file    Physically abused: Not on file    Forced sexual activity: Not on file  Other Topics Concern  . Not on file  Social History Narrative   ** Merged History Encounter **        Physical Exam Pulmonary:     Effort: No respiratory distress.     Breath sounds: No  wheezing or rales.  Abdominal:     General: There is no distension.  Musculoskeletal:     Right lower leg: No edema.     Left lower leg: No edema.  Skin:    General: Skin is warm and dry.         Future Appointments  Date Time Provider [place]  09/19/2018 10:00 AM [facility] PA/NP [facility] None     BP 112/70 (BP Location: Right Arm, Patient Position: Sitting, Cuff Size: Normal)   Pulse 77   Temp 99.1 F (37.3 C)   Resp 15   Wt 125 lb (56.7 kg)   LMP 02/18/2015 (Exact Date) Comment: MAYBE ONCE A YEAR  SpO2 98%   BMI 20.80 kg/m   Weight yesterday-125 Last visit weight-126  ATF pt CAO x4 sitting in the living room watching tv. She said that she stayed in her room yesterday and didn't have any energy.  She that she was depressed and "didn't want to get in her daughters way", although her daughter hasn't given her any reason to feel this way.  She said that she wanted to "use real bad last night" but she didn't.  Her son and daughter gives her money to hold her over but she feels like a burden.  She started zoloft a couple of weeks ago. She's taken all of her medications for the week.  She denies sob, chest pain and dizziness. rx bottles verified and pill box refilled.   She gave me the information needed to complete her medicaid app and the info that [place] brone need.    Medication ordered: none  Niklas Chretien, EMT Paramedic [phone] 09/12/2018    ACTION: Home visit completed

## 2018-09-13 ENCOUNTER — Telehealth (HOSPITAL_COMMUNITY): Payer: Self-pay | Admitting: Licensed Clinical Social Worker

## 2018-09-13 NOTE — Telephone Encounter (Signed)
Patient was approved to receive her Entresto through [employer].   Patient ID: [id number]  Effective Dates: 09/11/2018 through 09/11/2019  Called to give her the approval, had to leave voicemail. Left the phone number to [employer] as well. Will follow up to make sure she received her medication.  Charlann Boxer, CPhT

## 2018-09-13 NOTE — Telephone Encounter (Signed)
Pt provided community paramedic with requested documents for [facility] IOP program for pt substance abuse concerns.  CSW emailed documents to [place] with the IOP program for review.  CSW will continue to follow and assist as needed  Jorge Ny, [facility] Desk#: [phone] Cell#: [phone]

## 2018-09-19 ENCOUNTER — Telehealth (HOSPITAL_COMMUNITY): Payer: Self-pay | Admitting: *Deleted

## 2018-09-19 ENCOUNTER — Ambulatory Visit (HOSPITAL_COMMUNITY)
Admission: RE | Admit: 2018-09-19 | Discharge: 2018-09-19 | Disposition: A | Discharge status: Home / Self Care | Payer: Medicaid Other | Source: Ambulatory Visit | Attending: Internal Medicine | Admitting: Internal Medicine

## 2018-09-19 ENCOUNTER — Encounter (HOSPITAL_COMMUNITY): Payer: Self-pay

## 2018-09-19 ENCOUNTER — Other Ambulatory Visit (HOSPITAL_COMMUNITY): Payer: Self-pay

## 2018-09-19 ENCOUNTER — Other Ambulatory Visit: Payer: Self-pay

## 2018-09-19 VITALS — BP 130/70 | HR 72 | Wt 127.2 lb

## 2018-09-19 DIAGNOSIS — I081 Rheumatic disorders of both mitral and tricuspid valves: Secondary | ICD-10-CM | POA: Diagnosis not present

## 2018-09-19 DIAGNOSIS — J449 Chronic obstructive pulmonary disease, unspecified: Secondary | ICD-10-CM | POA: Insufficient documentation

## 2018-09-19 DIAGNOSIS — I10 Essential (primary) hypertension: Secondary | ICD-10-CM

## 2018-09-19 DIAGNOSIS — Z853 Personal history of malignant neoplasm of breast: Secondary | ICD-10-CM | POA: Insufficient documentation

## 2018-09-19 DIAGNOSIS — I428 Other cardiomyopathies: Secondary | ICD-10-CM | POA: Insufficient documentation

## 2018-09-19 DIAGNOSIS — F329 Major depressive disorder, single episode, unspecified: Secondary | ICD-10-CM | POA: Insufficient documentation

## 2018-09-19 DIAGNOSIS — I5042 Chronic combined systolic (congestive) and diastolic (congestive) heart failure: Secondary | ICD-10-CM | POA: Insufficient documentation

## 2018-09-19 DIAGNOSIS — Z87891 Personal history of nicotine dependence: Secondary | ICD-10-CM | POA: Diagnosis not present

## 2018-09-19 DIAGNOSIS — I130 Hypertensive heart and chronic kidney disease with heart failure and stage 1 through stage 4 chronic kidney disease, or unspecified chronic kidney disease: Secondary | ICD-10-CM | POA: Insufficient documentation

## 2018-09-19 DIAGNOSIS — Z8673 Personal history of transient ischemic attack (TIA), and cerebral infarction without residual deficits: Secondary | ICD-10-CM | POA: Diagnosis not present

## 2018-09-19 DIAGNOSIS — Z9012 Acquired absence of left breast and nipple: Secondary | ICD-10-CM | POA: Diagnosis not present

## 2018-09-19 DIAGNOSIS — F1410 Cocaine abuse, uncomplicated: Secondary | ICD-10-CM | POA: Diagnosis not present

## 2018-09-19 DIAGNOSIS — Z79899 Other long term (current) drug therapy: Secondary | ICD-10-CM | POA: Diagnosis not present

## 2018-09-19 DIAGNOSIS — I480 Paroxysmal atrial fibrillation: Secondary | ICD-10-CM | POA: Insufficient documentation

## 2018-09-19 DIAGNOSIS — N182 Chronic kidney disease, stage 2 (mild): Secondary | ICD-10-CM | POA: Diagnosis not present

## 2018-09-19 DIAGNOSIS — Z7951 Long term (current) use of inhaled steroids: Secondary | ICD-10-CM | POA: Insufficient documentation

## 2018-09-19 DIAGNOSIS — Z7901 Long term (current) use of anticoagulants: Secondary | ICD-10-CM | POA: Diagnosis not present

## 2018-09-19 DIAGNOSIS — Z880 Allergy status to penicillin: Secondary | ICD-10-CM | POA: Insufficient documentation

## 2018-09-19 DIAGNOSIS — I4892 Unspecified atrial flutter: Secondary | ICD-10-CM | POA: Diagnosis not present

## 2018-09-19 DIAGNOSIS — Z791 Long term (current) use of non-steroidal anti-inflammatories (NSAID): Secondary | ICD-10-CM | POA: Diagnosis not present

## 2018-09-19 DIAGNOSIS — I469 Cardiac arrest, cause unspecified: Secondary | ICD-10-CM | POA: Insufficient documentation

## 2018-09-19 LAB — BASIC METABOLIC PANEL
Anion gap: 11 (ref 5–15)
BUN: 10 mg/dL (ref 6–20)
CO2: 26 mmol/L (ref 22–32)
Calcium: 9.1 mg/dL (ref 8.9–10.3)
Chloride: 103 mmol/L (ref 98–111)
Creatinine, Ser: 1 mg/dL (ref 0.44–1.00)
GFR calc Af Amer: >60 mL/min (ref >60)
GFR calc non Af Amer: >60 mL/min (ref >60)
Glucose, Bld: 97 mg/dL (ref 70–99)
Potassium: 4.1 mmol/L (ref 3.5–5.1)
Sodium: 140 mmol/L (ref 135–145)

## 2018-09-19 MED ORDER — ENTRESTO 49-51 MG PO TABS: 1.0000 | ORAL_TABLET | Freq: Two times a day (BID) | ORAL | 3 refills | Status: DC

## 2018-09-19 NOTE — Telephone Encounter (Signed)
-----   Message from Consuelo Pandy, [place] sent at 09/19/2018  4:10 PM EDT ----- Kidney function and K normal. Increase Entresto to 49/51

## 2018-09-19 NOTE — Patient Instructions (Signed)
Routine lab work today. Will notify you of abnormal results  Follow up in 2 weeks.  Do the following things EVERYDAY: 1) Weigh yourself in the morning before breakfast. Write it down and keep it in a log. 2) Take your medicines as prescribed 3) Eat low salt foods-Limit salt (sodium) to 2000 mg per day.  4) Stay as active as you can everyday 5) Limit all fluids for the day to less than 2 liters

## 2018-09-19 NOTE — Progress Notes (Signed)
09/19/2018 Sabrina Holland   07-25-1970  [id number]  Primary Physician Gildardo Pounds, NP Primary Cardiologist: Sanda Klein, MD  Electrophysiologist: None  Advanced Heart Failure: Dr. 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  She had a normal echocardiogram in 2013 as part of a work-up for TIA. In 06/2017, she was admitted with SOB and initially had left AMA. However, she returned and was found to have severe CHF with EF 25-30% and grade 3 DD. She was diuresed and started on guideline directed therapy. She has been following with Dr. Ronal Fear therapylimited by her social situation and recurrent cocaine use.Repeat echo 11/2017 showed EF 25-30%, mild MR, mild LAE, PASP 80mmHg. She was admitted in 02/2018 for recurrent HF. She was not taking Lasix as prescribed due to frequent urination at work. She refused coronary CTor cathat that timeShe has subsequently followed up in telehealth with adjustment in meds. Dr. Sallyanne Kuster has suggested avoidance of beta blocker given continued cocaine use.   She pesented to [facility] 07/[facility]2018 with increased SOB and chest tightness.  UDS + for cocaine on admit. Had VF arrest on 6/19 with defibrillation x1 and intubated. ECHO showed severely reduced EF at 15%.  Treated for PNA. Had [place] wit normal cors. Discharged to home on 07/12/2018. She had post hospital f/u with Darrick Grinder, NP, 09/04/18. She denied SOB. Admitted to continued polysubstance abuse. Still using cocaine but not daily. Smoking 2-3 cigarettes per day. Drinking 40 ounces of beer a week.  Reported full med compliance. Followed by HF Paramedicine. BP was 121/79. Wt 126 lb. Losartan was discontinued and Entresto 24-26 added. EKG showed NSR and amiodarone dose was reduced down to 200 mg daily. Arlyce Harman, digoxin and Eliquis continued. No  blocker due to cocaine use.   Pt presents back to clinic for 2 week f/u. She is here with paramedic that assists her w/ medications, [name]. She has been compliant w/ meds. Tolerating new addition of Entresto well. No side effects. No dyspnea w/ ADLs but some mild dyspnea with moderate activity. No LEE. Sleeps with 1 pillow. No orthopnea/ PND. Denies palpitations. Compliant w/ daily weights. Cooks most of her meals at homes. Avoids salt and limits fluid intake to <1500 ml/day. She admits that she has had several relapses with cocaine use but trying to quit. Not using daily. She has declined inpatient rehab. She is still smoking 2-3 cigarettes/day but has quit alcohol. She also admits to depression but denies SI/HI. Has resumed taking Zoloft.     Cardiac Studies   [facility] 07/10/18 Procedures  RIGHT/LEFT HEART CATH AND CORONARY ANGIOGRAPHY  Conclusion  Findings:  On milrinone 0.125 mcg/kg/min  Ao = 84/64 (73) LV = 83/13 RA = 2 RV = 24/4 PA = 26/10 (17) PCW = 10 Fick cardiac output/index = 4.2/2.7 PVR = 1.7 WU FA sat = 100% PA sat = 71%, 73%  Assessment:  1. Normal coronary arteries 2. Severe NICM EF 10-15% 3. Well compensated hemodynamics on milrinone 0.125   2D Echo 07/05/18 1. The left ventricle has a 2D calculated ejection fraction 15%. The cavity size was normal. Left ventricular diastolic Doppler parameters  are indeterminate. Left ventricular diffuse hypokinesis.  2. The right ventricle has moderately reduced systolic function. The cavity was mildly enlarged. There is no increase in right ventricular wall thickness. Right ventricular systolic pressure is mildly elevated with an estimated pressure of 24.8 mmHg.  3. Left atrial size was  severely dilated.  4. Right atrial size was severely dilated.  5. Mild thickening of the mitral valve leaflet. Mitral valve regurgitation is moderate by color flow Doppler.  6. Tricuspid valve regurgitation is severe.  7. The inferior vena cava was dilated in size with <50% respiratory variability.   Current Meds  Medication Sig  . albuterol (VENTOLIN HFA) 108 (90 Base) MCG/ACT inhaler Inhale 2 puffs into the lungs every 6 (six) hours as needed for wheezing or shortness of breath (cough). Will pick up scripts tomorrow 7-9.  . amiodarone (PACERONE) 200 MG tablet Take 1 tablet (200 mg total) by mouth 2 (two) times daily.  Marland Kitchen apixaban (ELIQUIS) 5 MG TABS tablet Take 1 tablet (5 mg total) by mouth 2 (two) times daily. Patient will pick up scripts today.  . budesonide-formoterol (SYMBICORT) 80-4.5 MCG/ACT inhaler Inhale 2 puffs into the lungs 2 (two) times daily.  . diclofenac sodium (VOLTAREN) 1 % GEL Apply 2 g topically 4 (four) times daily.  . digoxin (LANOXIN) 0.125 MG tablet Take 1 tablet (0.125 mg total) by mouth daily.  . ferrous sulfate 325 (65 FE) MG tablet Take 1 tablet (325 mg total) by mouth 2 (two) times daily with a meal.  . fluticasone (FLONASE) 50 MCG/ACT nasal spray Place 2 sprays into both nostrils daily.  . folic acid (FOLVITE) 1 MG tablet Take 1 tablet (1 mg total) by mouth daily.  Marland Kitchen lidocaine (LIDODERM) 5 % Place 1 patch onto the skin daily. Remove & Discard patch within 12 hours or as directed by MD  . megestrol (MEGACE) 400 MG/10ML suspension Take 10 mLs (400 mg total) by mouth 2 (two) times daily.  . metroNIDAZOLE (FLAGYL) 500 MG tablet Take 1 tablet (500 mg total) by mouth 2 (two) times daily.  Marland Kitchen mouth rinse LIQD solution 15 mLs by Mouth Rinse route 2 (two) times daily.  . phenol (CHLORASEPTIC) 1.4 % LIQD Use as directed 1 spray in the mouth or throat as needed for throat irritation / pain.  . sacubitril-valsartan (ENTRESTO) 24-26 MG Take 1 tablet by mouth 2 (two) times  daily.  . sertraline (ZOLOFT) 50 MG tablet Take 1 tablet (50 mg total) by mouth daily.  Marland Kitchen spironolactone (ALDACTONE) 25 MG tablet Take 0.5 tablets (12.5 mg total) by mouth daily.  Marland Kitchen thiamine 100 MG tablet Take 1 tablet (100 mg total) by mouth daily.  Marland Kitchen tiotropium (SPIRIVA HANDIHALER) 18 MCG inhalation capsule Place 1 capsule (18 mcg total) into inhaler and inhale daily.   Allergies  Allergen Reactions  . Penicillins Other (See Comments)    Pt had slight throat swelling Has patient had a PCN reaction causing immediate rash, facial/tongue/throat swelling, SOB or lightheadedness with hypotension: yes Has patient had a PCN reaction causing severe rash involving mucus membranes or skin necrosis: No Has patient had a PCN reaction that required hospitalization No Has patient had a PCN reaction occurring within the last 10 years: No If all of the above answers are "NO", then may proceed with Cephalosporin use.    Past Medical History:  Diagnosis Date  . Atrial flutter ([place]) 07/03/2018  . Breast cancer, stage 2 ([place]) 02/23/2011  . Chronic combined systolic and diastolic CHF (congestive  heart failure) ([place])   . CKD (chronic kidney disease), stage II   . Cocaine use   . COPD (chronic obstructive pulmonary disease) ([place])   . Homelessness   . Hypertension   . Polysubstance abuse ([place])   . Poor social situation   . TIA (transient ischemic attack) 11/25/2011   "first time" (11/25/2011)   Family History  Problem Relation Age of Onset  . COPD Father   . Sickle cell anemia Cousin        maternal cousin   Past Surgical History:  Procedure Laterality Date  . BREAST BIOPSY  2007   left  . MASTECTOMY  09/2005   left  . RIGHT/LEFT HEART CATH AND CORONARY ANGIOGRAPHY N/A 07/10/2018   Procedure: RIGHT/LEFT HEART CATH AND CORONARY ANGIOGRAPHY;  Surgeon: Jolaine Artist, MD;  Location: [facility];  Service: Cardiovascular;  Laterality: N/A;  . TONSILLECTOMY AND ADENOIDECTOMY     "when I was  little" (11/25/2011)  . TUBAL LIGATION  1993   Social History   Socioeconomic History  . Marital status: Single    Spouse name: Not on file  . Number of children: 4  . Years of education: Not on file  . Highest education level: Not on file  Occupational History  . Occupation: trying to get disability    Employer: [employer]  Social Needs  . Financial resource strain: Not on file  . Food insecurity    Worry: Not on file    Inability: Not on file  . Transportation needs    Medical: Not on file    Non-medical: Not on file  Tobacco Use  . Smoking status: Former Smoker    Packs/day: 5.00    Years: 30.00    Pack years: 150.00    Types: Cigarettes  . Smokeless tobacco: Never Used  . Tobacco comment: 1 or 2 cigarettes a day- approx 1 pack week  Substance and Sexual Activity  . Alcohol use: Not Currently    Alcohol/week: 12.0 standard drinks    Types: 12 Cans of beer per week    Comment: last drink was 5/19  . Drug use: Yes    Types: Marijuana, Cocaine    Comment: daily marijuana; last use of cocaine 05/02/18 but selling it  . Sexual activity: Not Currently    Birth control/protection: Surgical  Lifestyle  . Physical activity    Days per week: Not on file    Minutes per session: Not on file  . Stress: Not on file  Relationships  . Social Herbalist on phone: Not on file    Gets together: Not on file    Attends religious service: Not on file    Active member of club or organization: Not on file    Attends meetings of clubs or organizations: Not on file    Relationship status: Not on file  . Intimate partner violence    Fear of current or ex partner: Not on file    Emotionally abused: Not on file    Physically abused: Not on file    Forced sexual activity: Not on file  Other Topics Concern  . Not on file  Social History Narrative   ** Merged History Encounter **         Lipid Panel     Component Value Date/Time   CHOL 78 07/04/2018 0018   TRIG 40  07/04/2018 0018   HDL 22 (L) 07/04/2018 0018   CHOLHDL 3.5 07/04/2018 0018  VLDL 8 07/04/2018 0018   LDLCALC 48 07/04/2018 0018    Review of Systems: General: negative for chills, fever, night sweats or weight changes.  Cardiovascular: negative for chest pain, dyspnea on exertion, edema, orthopnea, palpitations, paroxysmal nocturnal dyspnea or shortness of breath Dermatological: negative for rash Respiratory: negative for cough or wheezing Urologic: negative for hematuria Abdominal: negative for nausea, vomiting, diarrhea, bright red blood per rectum, melena, or hematemesis Neurologic: negative for visual changes, syncope, or dizziness All other systems reviewed and are otherwise negative except as noted above.  Physical Exam:  Blood pressure 130/70, pulse 72, weight 57.7 kg (127 lb 3.2 oz), last menstrual period 02/18/2015, SpO2 100 %.  General appearance: alert, cooperative, no distress and thin appearing middle aged AAF Neck: no carotid bruit and no JVD Lungs: clear to auscultation bilaterally Heart: regular rate and rhythm, S1, S2 normal, no murmur, click, rub or gallop Extremities: extremities normal, atraumatic, no cyanosis or edema Pulses: 2+ and symmetric Skin: Skin color, texture, turgor normal. No rashes or lesions Neurologic: Grossly normal  EKG NSR 72 bpm, Qt/QTc 410/488 ms  -- personally reviewed   ASSESSMENT AND PLAN:   1. Chronic systolic HF  - Longstanding sCHF.  Echo 07/05/18 w/ EF at 15% RV moderately to severely reduced. [place] 07/10/18 showed normal cors. Suspect CM due to polysubstance abuse +/- tachy-induced. - Not candidate for home inotropes or advanced therapies due to ongoing drug use - Continue w/ guidelines directed medical therapy - Entresto added at last OV 8/18. Tolerating well. BP stable. Needs f/u BMP today. If stable SCr and K, will further increase dose to 49-51 - Continue spiro 12.5 mg daily.  - Continue dig - No b-blocker due to ongoing cocaine  abuse.  - Continue w/ HF Paramedicine to help with medication compliance  -Plan to repeat ECHO in a few months.  - We discussed continuation of daily home weights and low sodium diet.   2. Cardiac arrest VFF/TdP  - s/p Code Blue 6/19 with defib x 1  3. PAF NSR by EKG at last OV>>> Amio dose reduced down to 200 mg daily.  - remains in NSR on EKG - recent TSH and HFTs ok  -Continue eliquis 5 mg twice a day. Denies abnormal bleeding. No falls.   4. Polysubstance abuse/cocaine abuse - longstanding. Started back using cocaine again, but not daily. Working hard to quit. Has quit smoking but still smoking cigarettes, 2-3/day. SW following and has referred to outpatient programs. She does not want inpatient treatment.   5. Tobacco Abuse.  - Discussed cessation.   6. Depression: denies SI/HI. Recently restarted Zoloft.  Qt/QTc stable on EKG    Follow-Up in 2 weeks w/ APP for further optimization of HF regimen. Try to push to high dose Entresto if able to tolerate.   Ever Halberg Ladoris Gene, MHS [facility] 09/19/2018 1:46 PM

## 2018-09-19 NOTE — Progress Notes (Signed)
Paramedicine Encounter   Patient ID: Sabrina Holland , female,   DOB: 05/23/1970,48 y.o.,  MRN: [id number]   Met patient in clinic today with provider Seward Speck.   [name] said that she feels great.  A couple of days ago she felt "unsteady on her feet", she thinks that she was "over doing it".  The next day she laid around to rest; no issues since then.  She tries to keep herself busy, reading, crossword puzzles etc.  She still feels depressed but she's continuing taking the zoloft.    EKG looks good; taken today since she's taking zoloft and amiodarone.  No bleeding noted (bowels, nose, etc.). feet and legs looks good.  Depending on blood work, entresto maybe increased. Time spent with patient 50 mins   [place], [place] 09/19/2018   ACTION: Home visit completed

## 2018-09-21 ENCOUNTER — Telehealth (HOSPITAL_COMMUNITY): Payer: Self-pay

## 2018-09-21 NOTE — Telephone Encounter (Signed)
Spoke with patient, she stated she is having no issues receiving her Entresto.  Charlann Boxer, CPhT

## 2018-09-21 NOTE — Telephone Encounter (Signed)
I called Sabrina Holland to schedule a time for me to fill her pill box.  I left a message asking her to return my call.

## 2018-09-27 ENCOUNTER — Telehealth (HOSPITAL_COMMUNITY): Payer: Self-pay

## 2018-09-27 NOTE — Telephone Encounter (Signed)
I called Sabrina Holland to schedule a time for me to come today, she asked me to come tomorrow instead.

## 2018-09-28 ENCOUNTER — Telehealth (HOSPITAL_COMMUNITY): Payer: Self-pay

## 2018-09-28 ENCOUNTER — Other Ambulatory Visit (HOSPITAL_COMMUNITY): Payer: Self-pay

## 2018-09-28 MED ORDER — AMIODARONE HCL 200 MG PO TABS: 200.0000 mg | ORAL_TABLET | Freq: Every day | ORAL | 0 refills | Status: DC

## 2018-09-28 NOTE — Progress Notes (Signed)
Paramedicine Encounter    Patient ID: Sabrina Holland, female    DOB: 10/29/1970, 48 y.o.   MRN: [id number]    Patient Care Team: Gildardo Pounds, NP as PCP - General (Nurse Practitioner) Sanda Klein, MD as PCP - Cardiology (Cardiology)  Patient Active Problem List   Diagnosis Date Noted  . Palliative care by specialist   . Goals of care, counseling/discussion   . Acute respiratory failure ([place])   . Endotracheally intubated   . Torsades de pointes ([place])   . Cardiac arrest ([place])   . Atrial flutter ([place]) 07/03/2018  . Elevated LFTs 04/18/2018  . Malnutrition of moderate degree 02/27/2018  . Dyspnea 02/26/2018  . Acute on chronic systolic and diastolic heart failure, NYHA class 3 ([place]) 01/20/2018  . Cocaine abuse ([place]) 12/19/2017  . Mixed hyperlipidemia 12/19/2017  . COPD exacerbation ([place])   . Acute on chronic respiratory failure with hypoxemia ([place]) 12/03/2017  . Cardiomyopathy ([place]) 07/04/2017  . COPD (chronic obstructive pulmonary disease) ([place])   . Chronic combined systolic and diastolic heart failure ([place])   . Anemia 06/20/2017  . Polysubstance abuse ([place]) 11/26/2011  . TIA (transient ischemic attack) 11/25/2011  . Essential hypertension 11/25/2011  . Sinusitis 11/25/2011  . Vitamin B 12 deficiency 07/18/2011  . History of breast cancer 02/23/2011    Current Outpatient Medications:  .  apixaban (ELIQUIS) 5 MG TABS tablet, Take 1 tablet (5 mg total) by mouth 2 (two) times daily. Patient will pick up scripts today., Disp: 180 tablet, Rfl: 3 .  digoxin (LANOXIN) 0.125 MG tablet, Take 1 tablet (0.125 mg total) by mouth daily., Disp: 30 tablet, Rfl: 0 .  ferrous sulfate 325 (65 FE) MG tablet, Take 1 tablet (325 mg total) by mouth 2 (two) times daily with a meal., Disp: 60 tablet, Rfl: 0 .  folic acid (FOLVITE) 1 MG tablet, Take 1 tablet (1 mg total) by mouth daily., Disp: 30 tablet, Rfl: 2 .  sacubitril-valsartan (ENTRESTO) 49-51 MG, Take 1 tablet by mouth 2  (two) times daily., Disp: 60 tablet, Rfl: 3 .  sertraline (ZOLOFT) 50 MG tablet, Take 1 tablet (50 mg total) by mouth daily., Disp: 30 tablet, Rfl: 3 .  spironolactone (ALDACTONE) 25 MG tablet, Take 0.5 tablets (12.5 mg total) by mouth daily., Disp: 30 tablet, Rfl: 0 .  thiamine 100 MG tablet, Take 1 tablet (100 mg total) by mouth daily., Disp: 30 tablet, Rfl: 0 .  albuterol (VENTOLIN HFA) 108 (90 Base) MCG/ACT inhaler, Inhale 2 puffs into the lungs every 6 (six) hours as needed for wheezing or shortness of breath (cough). Will pick up scripts tomorrow 7-9., Disp: 18 g, Rfl: 1 .  amiodarone (PACERONE) 200 MG tablet, Take 1 tablet (200 mg total) by mouth daily., Disp: 60 tablet, Rfl: 0 .  budesonide-formoterol (SYMBICORT) 80-4.5 MCG/ACT inhaler, Inhale 2 puffs into the lungs 2 (two) times daily., Disp: 1 Inhaler, Rfl: 6 .  diclofenac sodium (VOLTAREN) 1 % GEL, Apply 2 g topically 4 (four) times daily., Disp: 100 g, Rfl: 1 .  fluticasone (FLONASE) 50 MCG/ACT nasal spray, Place 2 sprays into both nostrils daily., Disp: 16 g, Rfl: 0 .  lidocaine (LIDODERM) 5 %, Place 1 patch onto the skin daily. Remove & Discard patch within 12 hours or as directed by MD, Disp: 30 patch, Rfl: 0 .  megestrol (MEGACE) 400 MG/10ML suspension, Take 10 mLs (400 mg total) by mouth 2 (two) times daily., Disp: 240 mL, Rfl: 0 .  metroNIDAZOLE (FLAGYL)  500 MG tablet, Take 1 tablet (500 mg total) by mouth 2 (two) times daily., Disp: 14 tablet, Rfl: 0 .  mouth rinse LIQD solution, 15 mLs by Mouth Rinse route 2 (two) times daily., Disp: 44 mL, Rfl: 0 .  phenol (CHLORASEPTIC) 1.4 % LIQD, Use as directed 1 spray in the mouth or throat as needed for throat irritation / pain., Disp: 20 mL, Rfl: 0 .  tiotropium (SPIRIVA HANDIHALER) 18 MCG inhalation capsule, Place 1 capsule (18 mcg total) into inhaler and inhale daily., Disp: 30 capsule, Rfl: 11 Allergies  Allergen Reactions  . Penicillins Other (See Comments)    Pt had slight throat  swelling Has patient had a PCN reaction causing immediate rash, facial/tongue/throat swelling, SOB or lightheadedness with hypotension: yes Has patient had a PCN reaction causing severe rash involving mucus membranes or skin necrosis: No Has patient had a PCN reaction that required hospitalization No Has patient had a PCN reaction occurring within the last 10 years: No If all of the above answers are "NO", then may proceed with Cephalosporin use.      Social History   Socioeconomic History  . Marital status: Single    Spouse name: Not on file  . Number of children: 4  . Years of education: Not on file  . Highest education level: Not on file  Occupational History  . Occupation: trying to get disability    Employer: [employer]  Social Needs  . Financial resource strain: Not on file  . Food insecurity    Worry: Not on file    Inability: Not on file  . Transportation needs    Medical: Not on file    Non-medical: Not on file  Tobacco Use  . Smoking status: Former Smoker    Packs/day: 5.00    Years: 30.00    Pack years: 150.00    Types: Cigarettes  . Smokeless tobacco: Never Used  . Tobacco comment: 1 or 2 cigarettes a day- approx 1 pack week  Substance and Sexual Activity  . Alcohol use: Not Currently    Alcohol/week: 12.0 standard drinks    Types: 12 Cans of beer per week    Comment: last drink was 5/19  . Drug use: Yes    Types: Marijuana, Cocaine    Comment: daily marijuana; last use of cocaine 05/02/18 but selling it  . Sexual activity: Not Currently    Birth control/protection: Surgical  Lifestyle  . Physical activity    Days per week: Not on file    Minutes per session: Not on file  . Stress: Not on file  Relationships  . Social Herbalist on phone: Not on file    Gets together: Not on file    Attends religious service: Not on file    Active member of club or organization: Not on file    Attends meetings of clubs or organizations: Not on file     Relationship status: Not on file  . Intimate partner violence    Fear of current or ex partner: Not on file    Emotionally abused: Not on file    Physically abused: Not on file    Forced sexual activity: Not on file  Other Topics Concern  . Not on file  Social History Narrative   ** Merged History Encounter **        Physical Exam Pulmonary:     Effort: No respiratory distress.     Breath sounds: No wheezing or rales.  Musculoskeletal:        General: No swelling.     Right lower leg: No edema.     Left lower leg: No edema.  Skin:    General: Skin is warm and dry.         Future Appointments  Date Time Provider [place]  10/04/2018 10:00 AM [facility] PA/NP [facility] None     BP 122/80 (BP Location: Right Arm, Patient Position: Sitting, Cuff Size: Normal)   Pulse 87   Temp 98.1 F (36.7 C)   Wt 127 lb (57.6 kg)   LMP 02/18/2015 (Exact Date) Comment: MAYBE ONCE A YEAR  SpO2 98%   BMI 21.13 kg/m   Weight yesterday-127 Last visit weight-127  ATF pt CAO x4 with no complaints. She said that she feels good; she felt a  "little sob walking to the apartment from the bus stop".  But she think that its due to the pace she was walking in. She denies chest pain and dizziness. She's taken all of her medications for the past week. She's aware of [facility] upcoming [facility].  rx bottles verified and pill box refilled.    I was able to verify with Nira Conn, that Mrs. Holmon is suppose to take 200 mg Amiodarone daily.  She will make the changes in epic.   Medication ordered: sertraline Digoxin  Steve Youngberg, EMT Paramedic [phone] 09/28/2018    ACTION: Home visit completed

## 2018-09-28 NOTE — Telephone Encounter (Signed)
[name] called to clarify pt's Amio dose, chart showed 200 mg BID however she states pt was on 200 mg daily since back in the summer and wants to make sure if that is correct.  Per chart review Dr Aundra Dubin decreased Amio to 200 mg daily back on 7/1 and most recent OV note 9/2 states to continue that dose, med list in epic updated

## 2018-10-01 IMAGING — CR DG LUMBAR SPINE COMPLETE 4+V
5 series · 5 of 5 positions shown · non-contrast
Comparison: Lumbar spine series 11/12/2012

CLINICAL DATA: Back pain for 3 days.

EXAM:
LUMBAR SPINE - COMPLETE 4+ VIEW

[t lumbar spine obl (1 of 2)]
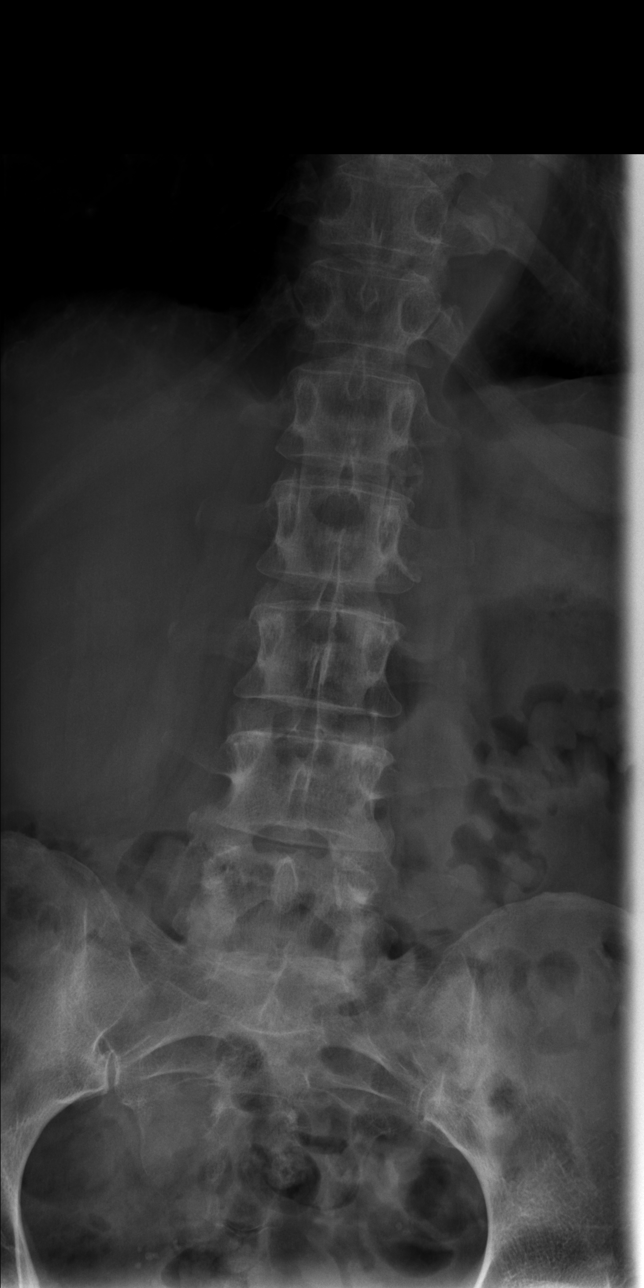

[t lumbar spine obl (2 of 2)]
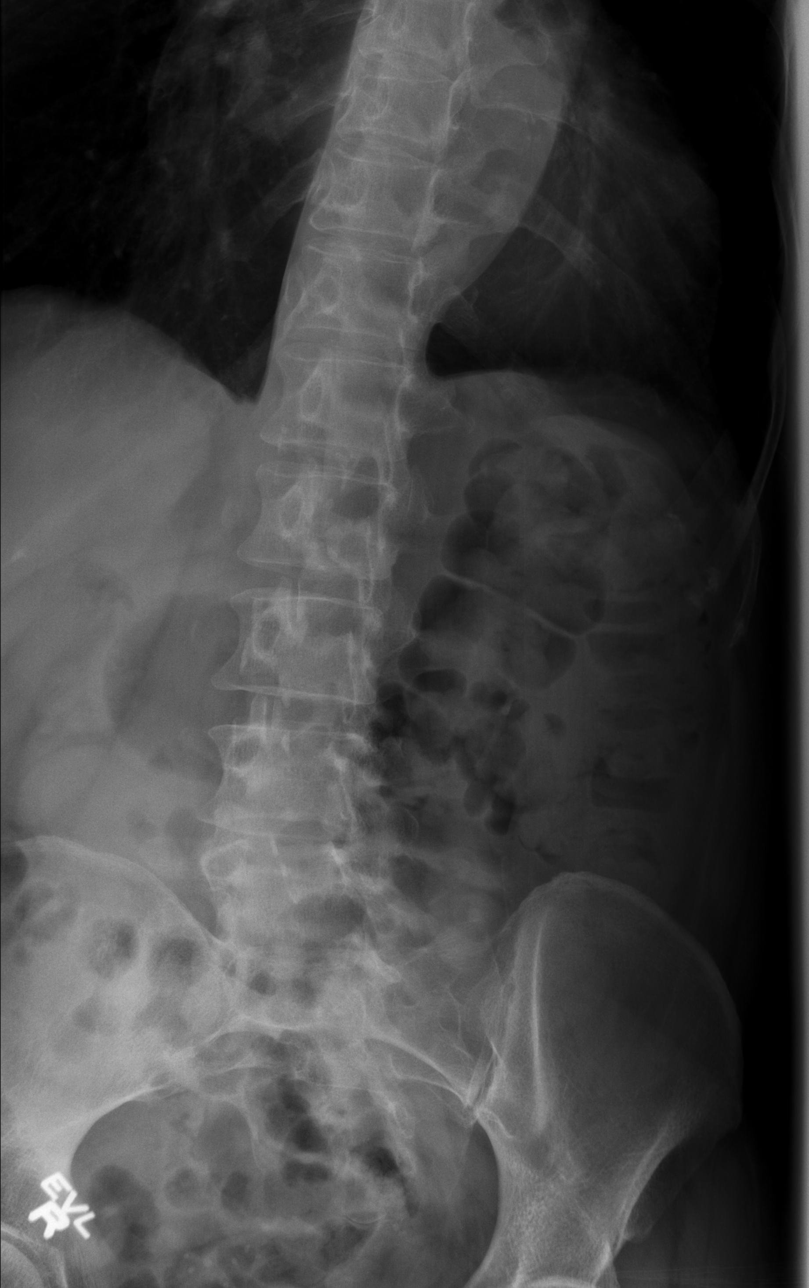

[t lumbar spine lat (1 of 2)]
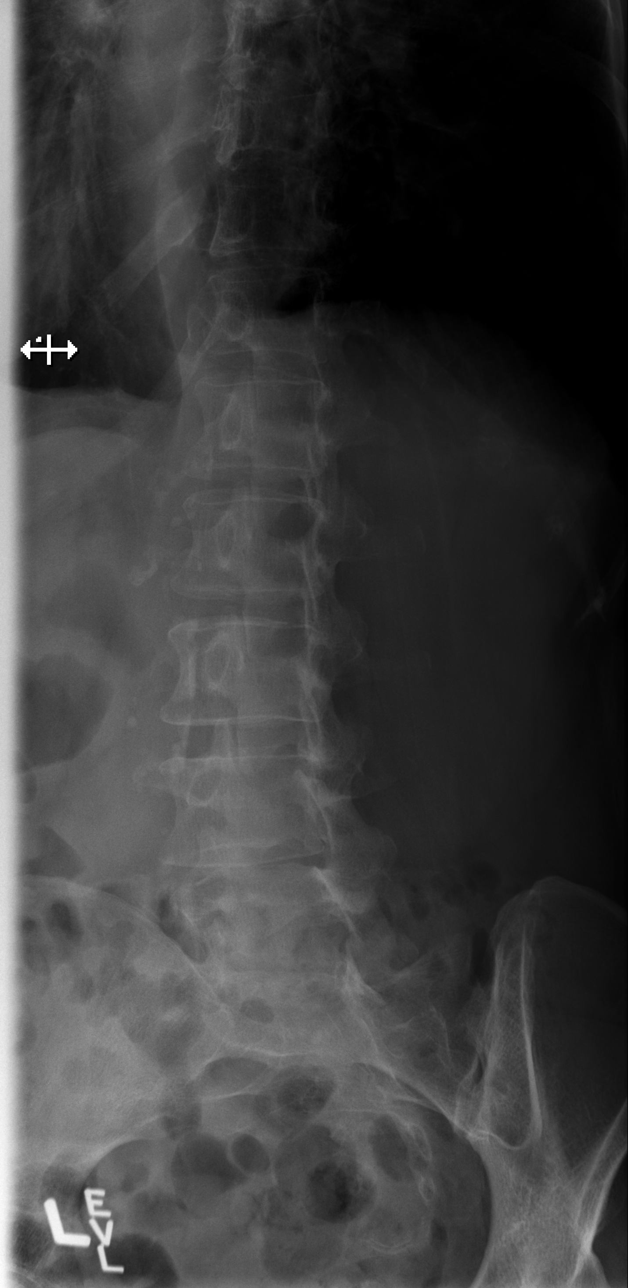

[t lumbar spine lat (2 of 2)]
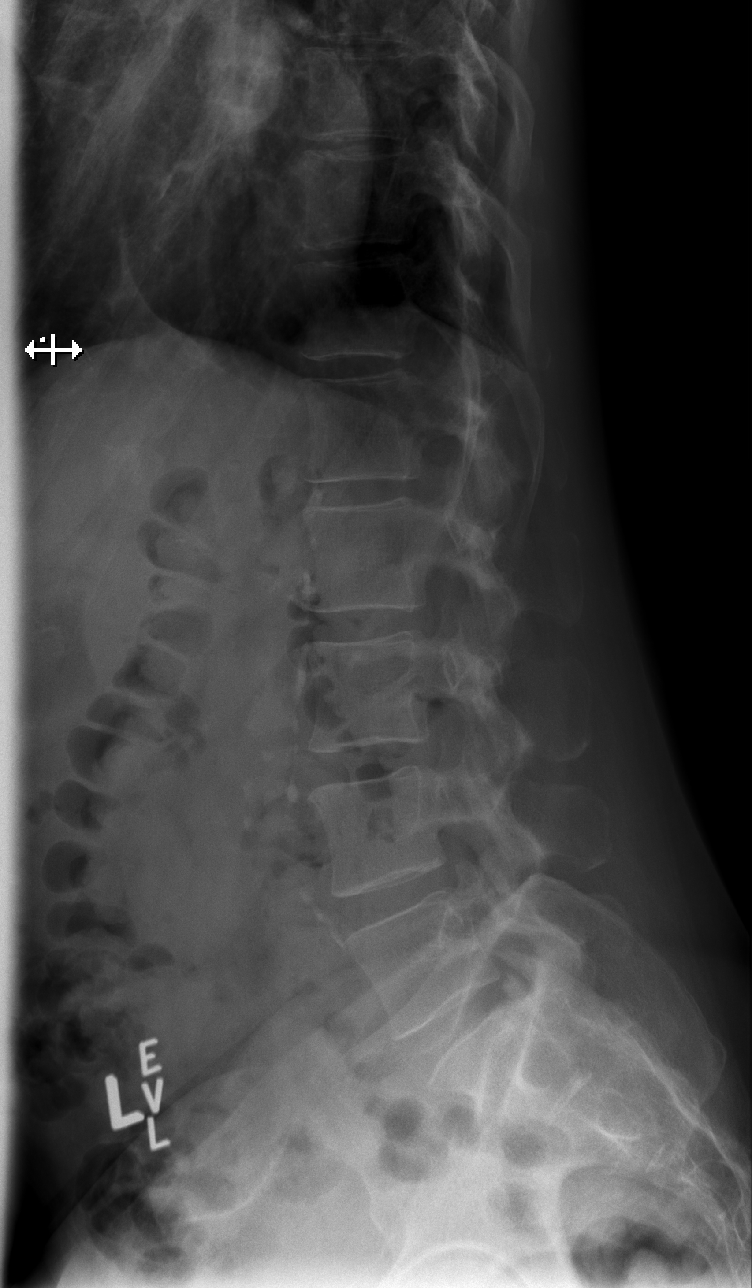

[t lumbar l-5 s-1 spot]
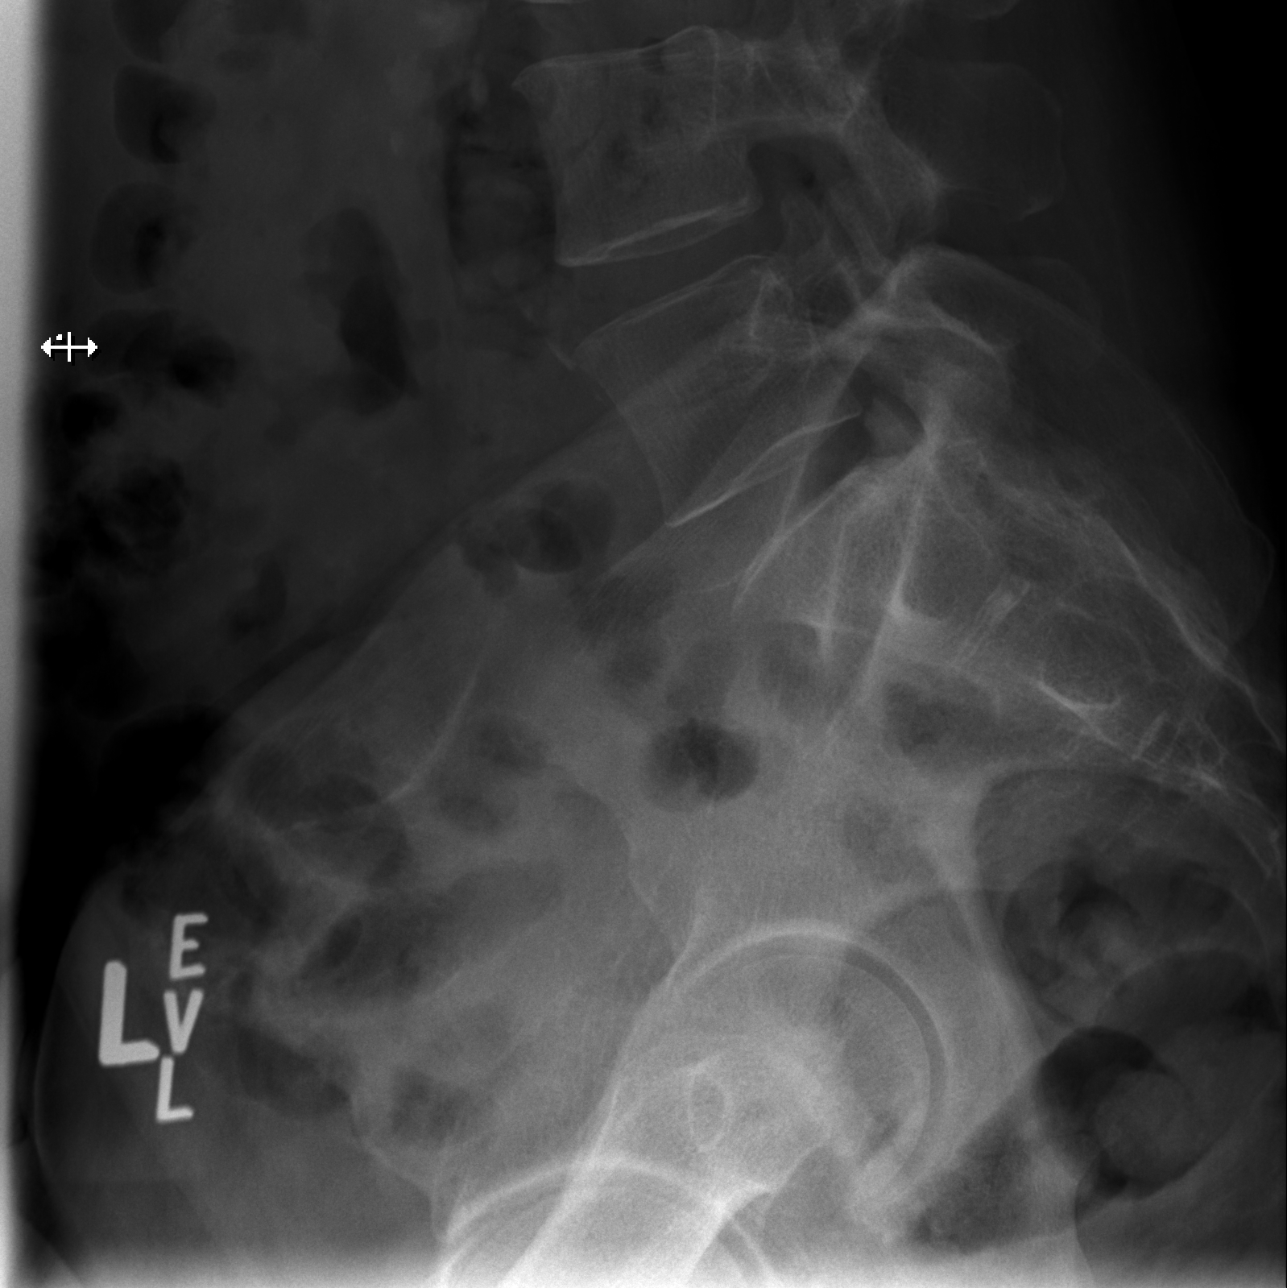

[5 of 5 positions shown; findings below may reference images not displayed]

FINDINGS: Normal alignment of the lumbar vertebral bodies. Disc spaces and
vertebral bodies are maintained. The facets are normally aligned. No
pars defects. The visualized bony pelvis is intact. Advanced
aortoiliac calcifications for age.
IMPRESSION: Normal alignment and no acute bony findings.

Advanced aortic calcifications for age.

## 2018-10-03 ENCOUNTER — Telehealth: Payer: Self-pay

## 2018-10-03 NOTE — Telephone Encounter (Signed)
As per Abelino Derrick, [facility], they have experienced difficulty connecting with the patient

## 2018-10-04 ENCOUNTER — Other Ambulatory Visit (HOSPITAL_COMMUNITY): Payer: Self-pay

## 2018-10-04 ENCOUNTER — Other Ambulatory Visit: Payer: Self-pay

## 2018-10-04 ENCOUNTER — Ambulatory Visit (HOSPITAL_COMMUNITY)
Admission: RE | Admit: 2018-10-04 | Discharge: 2018-10-04 | Disposition: A | Discharge status: Home / Self Care | Payer: Medicaid Other | Source: Ambulatory Visit | Attending: Cardiology | Admitting: Cardiology

## 2018-10-04 ENCOUNTER — Encounter (HOSPITAL_COMMUNITY): Payer: Self-pay

## 2018-10-04 VITALS — BP 140/78 | HR 97 | Wt 125.0 lb

## 2018-10-04 DIAGNOSIS — Z87891 Personal history of nicotine dependence: Secondary | ICD-10-CM | POA: Insufficient documentation

## 2018-10-04 DIAGNOSIS — Z79899 Other long term (current) drug therapy: Secondary | ICD-10-CM | POA: Insufficient documentation

## 2018-10-04 DIAGNOSIS — N182 Chronic kidney disease, stage 2 (mild): Secondary | ICD-10-CM | POA: Insufficient documentation

## 2018-10-04 DIAGNOSIS — I130 Hypertensive heart and chronic kidney disease with heart failure and stage 1 through stage 4 chronic kidney disease, or unspecified chronic kidney disease: Secondary | ICD-10-CM | POA: Diagnosis not present

## 2018-10-04 DIAGNOSIS — E785 Hyperlipidemia, unspecified: Secondary | ICD-10-CM | POA: Diagnosis not present

## 2018-10-04 DIAGNOSIS — F1910 Other psychoactive substance abuse, uncomplicated: Secondary | ICD-10-CM

## 2018-10-04 DIAGNOSIS — I5042 Chronic combined systolic (congestive) and diastolic (congestive) heart failure: Secondary | ICD-10-CM | POA: Diagnosis not present

## 2018-10-04 DIAGNOSIS — I428 Other cardiomyopathies: Secondary | ICD-10-CM

## 2018-10-04 DIAGNOSIS — I469 Cardiac arrest, cause unspecified: Secondary | ICD-10-CM | POA: Insufficient documentation

## 2018-10-04 DIAGNOSIS — I10 Essential (primary) hypertension: Secondary | ICD-10-CM

## 2018-10-04 DIAGNOSIS — J449 Chronic obstructive pulmonary disease, unspecified: Secondary | ICD-10-CM | POA: Diagnosis not present

## 2018-10-04 DIAGNOSIS — I340 Nonrheumatic mitral (valve) insufficiency: Secondary | ICD-10-CM | POA: Insufficient documentation

## 2018-10-04 DIAGNOSIS — Z8673 Personal history of transient ischemic attack (TIA), and cerebral infarction without residual deficits: Secondary | ICD-10-CM | POA: Diagnosis not present

## 2018-10-04 DIAGNOSIS — I480 Paroxysmal atrial fibrillation: Secondary | ICD-10-CM | POA: Diagnosis not present

## 2018-10-04 DIAGNOSIS — Z7901 Long term (current) use of anticoagulants: Secondary | ICD-10-CM | POA: Diagnosis not present

## 2018-10-04 DIAGNOSIS — R0602 Shortness of breath: Secondary | ICD-10-CM | POA: Diagnosis present

## 2018-10-04 DIAGNOSIS — Z720 Tobacco use: Secondary | ICD-10-CM

## 2018-10-04 MED ORDER — SPIRONOLACTONE 25 MG PO TABS: 25.0000 mg | ORAL_TABLET | Freq: Every day | ORAL | 1 refills | Status: DC

## 2018-10-04 MED ORDER — DIGOXIN 125 MCG PO TABS: 0.1250 mg | ORAL_TABLET | Freq: Every day | ORAL | 0 refills | Status: DC

## 2018-10-04 MED FILL — SPIRONOLACTONE 25 MG TABLET: 25 | 30 days supply | Qty: 30 | Fill #0

## 2018-10-04 MED FILL — ?FOLIC ACID 1MG TAB: 1 | 30 days supply | Qty: 30 | Fill #1

## 2018-10-04 MED FILL — DIGOXIN 0.125 MG TABLET: 125 | 30 days supply | Qty: 30 | Fill #0

## 2018-10-04 MED FILL — SERTRALINE HCL 25 MG TABLET: 25 | 30 days supply | Qty: 60 | Fill #1

## 2018-10-04 NOTE — Patient Instructions (Signed)
INCREASE Spironolactone to 25mg , one tab daily  Your physician recommends that you schedule a follow-up appointment in: 2 weeks with Amy Clegg,NP  (labs and echo)  Your physician has requested that you have an echocardiogram. Echocardiography is a painless test that uses sound waves to create images of your heart. It provides your doctor with information about the size and shape of your heart and how well your heart's chambers and valves are working. This procedure takes approximately one hour. There are no restrictions for this procedure.   Do the following things EVERYDAY: 1) Weigh yourself in the morning before breakfast. Write it down and keep it in a log. 2) Take your medicines as prescribed 3) Eat low salt foods-Limit salt (sodium) to 2000 mg per day.  4) Stay as active as you can everyday 5) Limit all fluids for the day to less than 2 liters   At the [facility], you and your health needs are our priority. As part of our continuing mission to provide you with exceptional heart care, we have created designated Provider Care Teams. These Care Teams include your primary Cardiologist (physician) and Advanced Practice Providers (APPs- Physician Assistants and Nurse Practitioners) who all work together to provide you with the care you need, when you need it.   You may see any of the following providers on your designated Care Team at your next follow up: Marland Kitchen Dr Glori Bickers . Dr Loralie Champagne . Darrick Grinder, NP   Please be sure to bring in all your medications bottles to every appointment.

## 2018-10-04 NOTE — Progress Notes (Signed)
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  She had a normal echocardiogram in 2013 as part of a work-up for TIA. In 06/2017  admitted with SOB and initially had left AMA. However, she returned and was found to have severe CHF with EF 25-30%, grade 3 DD. She was diuresed and started on guideline directed therapy. She has been following with Dr. Recardo Evangelist but therapy limited by her social situation and recurrent cocaine use. Last echo 11/2017 showed EF 25-30%, mild MR, mild LAE, PASP 26mmHg. She was admitted in 02/2018 for recurrent HF. She was not taking Lasix as prescribed due to frequent urination at work. She refused coronary CT or cath at that time She has subsequently followed up in telehealth with adjustment in meds. Dr. Sallyanne Kuster has suggested avoidance of beta blocker given continued cocaine use.   Presented to [facility] 07/03/2018 with increased SOB and chest tightness. +  For cocaine on admit. VF arrest on 6/19 with defibrillation x1 and intubation.ECHO showed severely reduced EF at 15%.  Treated for PNA.Had [place] wit normal cors. Discharged to home on 07/12/2018.   Today she returns for HF follow up.Overall feeling fine. She does get SOB with steps. Says she is ok if she walks slowly. Denies PND/Orthopnea. No chest pain. Appetite ok. No fever or chills. Weight at home 125-127 pounds. Taking all medications. Has not used cocaine in the last 4 weeks. Followed closely by HF Paramedicine.   Echo 07/05/18 EF 15% RV moderately to severely down  LHC 07/10/18  normal cors.   ROS: All systems negative except as listed in HPI, PMH  and Problem List.  SH:  Social History   Socioeconomic History  . Marital status: Single    Spouse name: Not on file  . Number of children: 4  . Years of education: Not on file  . Highest education level: Not on file  Occupational History  . Occupation: trying to get disability    Employer: [employer]  Social Needs  . Financial resource strain: Not on file  . Food insecurity    Worry: Not on file    Inability: Not on file  . Transportation needs    Medical: Not on file    Non-medical: Not on file  Tobacco Use  . Smoking status: Former Smoker    Packs/day: 5.00    Years: 30.00    Pack years: 150.00    Types: Cigarettes  . Smokeless tobacco: Never Used  . Tobacco comment: 1 or 2 cigarettes a day- approx 1 pack week  Substance and Sexual Activity  . Alcohol use: Not Currently    Alcohol/week: 12.0 standard drinks    Types: 12 Cans of beer per week    Comment: last drink was 5/19  . Drug use: Yes    Types: Marijuana, Cocaine    Comment: daily marijuana; last use of cocaine 05/02/18 but selling it  . Sexual activity: Not Currently    Birth control/protection: Surgical  Lifestyle  . Physical activity    Days per week: Not on file    Minutes per session: Not on file  . Stress: Not on file  Relationships  . Social connections  Talks on phone: Not on file    Gets together: Not on file    Attends religious service: Not on file    Active member of club or organization: Not on file    Attends meetings of clubs or organizations: Not on file    Relationship status: Not on file  . Intimate partner violence    Fear of current or ex partner: Not on file    Emotionally abused: Not on file    Physically abused: Not on file    Forced sexual activity: Not on file  Other Topics Concern  . Not on file  Social History Narrative   ** Merged History Encounter **        FH:  Family History  Problem Relation Age of Onset  . COPD Father   . Sickle cell anemia Cousin         maternal cousin    Past Medical History:  Diagnosis Date  . Atrial flutter ([place]) 07/03/2018  . Breast cancer, stage 2 ([place]) 02/23/2011  . Chronic combined systolic and diastolic CHF (congestive heart failure) ([place])   . CKD (chronic kidney disease), stage II   . Cocaine use   . COPD (chronic obstructive pulmonary disease) ([place])   . Homelessness   . Hypertension   . Polysubstance abuse ([place])   . Poor social situation   . TIA (transient ischemic attack) 11/25/2011   "first time" (11/25/2011)    Current Outpatient Medications  Medication Sig Dispense Refill  . albuterol (VENTOLIN HFA) 108 (90 Base) MCG/ACT inhaler Inhale 2 puffs into the lungs every 6 (six) hours as needed for wheezing or shortness of breath (cough). Will pick up scripts tomorrow 7-9. 18 g 1  . amiodarone (PACERONE) 200 MG tablet Take 1 tablet (200 mg total) by mouth daily. 60 tablet 0  . apixaban (ELIQUIS) 5 MG TABS tablet Take 1 tablet (5 mg total) by mouth 2 (two) times daily. Patient will pick up scripts today. 180 tablet 3  . budesonide-formoterol (SYMBICORT) 80-4.5 MCG/ACT inhaler Inhale 2 puffs into the lungs 2 (two) times daily. 1 Inhaler 6  . diclofenac sodium (VOLTAREN) 1 % GEL Apply 2 g topically 4 (four) times daily. 100 g 1  . digoxin (LANOXIN) 0.125 MG tablet Take 1 tablet (0.125 mg total) by mouth daily. 30 tablet 0  . ferrous sulfate 325 (65 FE) MG tablet Take 1 tablet (325 mg total) by mouth 2 (two) times daily with a meal. 60 tablet 0  . fluticasone (FLONASE) 50 MCG/ACT nasal spray Place 2 sprays into both nostrils daily. 16 g 0  . folic acid (FOLVITE) 1 MG tablet Take 1 tablet (1 mg total) by mouth daily. 30 tablet 2  . lidocaine (LIDODERM) 5 % Place 1 patch onto the skin daily. Remove & Discard patch within 12 hours or as directed by MD 30 patch 0  . megestrol (MEGACE) 400 MG/10ML suspension Take 10 mLs (400 mg total) by mouth 2 (two) times daily. 240 mL 0  . metroNIDAZOLE (FLAGYL) 500 MG tablet Take 1  tablet (500 mg total) by mouth 2 (two) times daily. 14 tablet 0  . mouth rinse LIQD solution 15 mLs by Mouth Rinse route 2 (two) times daily. 44 mL 0  . phenol (CHLORASEPTIC) 1.4 % LIQD Use as directed 1 spray in the mouth or throat as needed for throat irritation / pain. 20 mL 0  . sacubitril-valsartan (ENTRESTO) 49-51 MG Take 1 tablet by mouth 2 (two) times daily.  60 tablet 3  . sertraline (ZOLOFT) 50 MG tablet Take 1 tablet (50 mg total) by mouth daily. 30 tablet 3  . spironolactone (ALDACTONE) 25 MG tablet Take 0.5 tablets (12.5 mg total) by mouth daily. 30 tablet 0  . thiamine 100 MG tablet Take 1 tablet (100 mg total) by mouth daily. 30 tablet 0  . tiotropium (SPIRIVA HANDIHALER) 18 MCG inhalation capsule Place 1 capsule (18 mcg total) into inhaler and inhale daily. 30 capsule 11   No current facility-administered medications for this encounter.     Vitals:   10/04/18 0958  BP: 140/78  Pulse: 97  SpO2: 97%  Weight: 56.7 kg (125 lb)   Wt Readings from Last 3 Encounters:  10/04/18 56.7 kg (125 lb)  09/28/18 57.6 kg (127 lb)  09/19/18 57.7 kg (127 lb 3.2 oz)    PHYSICAL EXAM: General:  Well appearing. No resp difficulty HEENT: normal Neck: supple. no JVD. Carotids 2+ bilat; no bruits. No lymphadenopathy or thryomegaly appreciated. Cor: PMI nondisplaced. Regular rate & rhythm. No rubs, gallops or murmurs. Lungs: clear Abdomen: soft, nontender, nondistended. No hepatosplenomegaly. No bruits or masses. Good bowel sounds. Extremities: no cyanosis, clubbing, rash, edema Neuro: alert & orientedx3, cranial nerves grossly intact. moves all 4 extremities w/o difficulty. Affect pleasant   ASSESSMENT & PLAN: 1. Chronic systolic HF  - Longstanding  Echo 07/05/18 EF 15% RV moderately to severely downPersonally reviewed. Suspect due to polysubstance abuse +/- tachy-induced. - Cath 6/23 normal cors.  -- NYHA III. Volume status stable.  - No BB - Continue entresto 49-51  mg twice a day.   - Increase spiro 25 mg daily.  - Continue dig. - No b-blocker yet due to shock and cocaine abuse.  - Not candidate for home inotropes or advanced therapies due to ongoing drug use -Plan to repeat ECHO next visit.   2. Cardiac arrest VFF/TdP  - s/p Code Blue 6/19 with defib x 1  3. PAF -Regular on exam.  - Continue amio 200 mg daily.  - Continue eliquis 5 mg twice a day.   4. Polysubstance abuse/cocaine abuse - longstanding.She has not used cocaine in the last 4 weeks. Congratulated.  -SW following and has referred to outpatient programs. She does not want inpatient treatment.   5. Tobacco Abuse.  She has cut back . Needs to stop smoking.   Follow up in 2-3 weeks with an ECHO BMET and dig level. Personally discussed with HF Paramedicine.    Amy Clegg NP-C  10:03 AM

## 2018-10-04 NOTE — Progress Notes (Signed)
CSW met with pt during clinic visit to check in.  Patient doing well and states she has been clean for a month- pt is agreeable to taking NA meeting schedule today and says she will follow up if she feels like she needs it.  Pt still awaiting word from disability- CSW called case worker and left message to see if they need anything further from [place].  Pt has been denied for unemployment benefits but states she is getting money from a friend and plans to move into her own apartment next month so she is excited to be more independent at that time.  CSW will continue to follow and assist as needed  Jorge Ny, [facility] Desk#: [phone] Cell#: [phone]

## 2018-10-04 NOTE — Addendum Note (Signed)
Encounter addended by: Jorge Ny, LCSW on: 10/04/2018 12:09 PM  Actions taken: Clinical Note Signed

## 2018-10-04 NOTE — Progress Notes (Signed)
Paramedicine Encounter   Patient ID: Jaide Hillenburg , female,   DOB: [birth year],48 y.o.,  MRN: [id number]   Met patient in clinic today with provider Amy.   She has no complaints today.  She said that she became sob while walking to the clinic.  [place] off further than ususal so she was fast pace walking, she says that's the reason for the sob.  She took her medications this morning. shes completely of digoxin; [facility].  rx pill box refilled during today's visit.  Entresto still hasn't been delivered to her house yet.  Eliezer Lofts said that a 30 day supply will be out on Monday; the additional amount will be delivered (3 mos altogether).    I was able to get samples from the office.  Med changes listed below.  Her pill box was filled according to the med changes today.    Time spent with patient: 40 mins   Medication change: Increase spirolactone to full tab 39m  rx called in: Folic acid Sertraline Digoxin   [place] EMT-Paramedic [id number]/2020   ACTION: Home visit completed

## 2018-10-08 ENCOUNTER — Telehealth (HOSPITAL_COMMUNITY): Payer: Self-pay | Admitting: Licensed Clinical Social Worker

## 2018-10-08 NOTE — Telephone Encounter (Signed)
CSW received message from pt requesting help with getting print out from [facility] and [facility] with her year to date copay costs.  Pt states she has attempted to call several times but with no answer and had to leave a message.  CSW called CHW and spoke to pharmacy staff who report pt will need to come in person to the pharmacy with an ID so she can sign a release- pt informed.  Pt also inquired about call from [employer] stating that their was suspicion that someone was using her SS number and asking for the last 4 of her social.  CSW encouraged pt not to provide any information to an unknown caller and that if she was worried about her security she should call the social security office herself.  CSW will continue to follow and assist as needed  Jorge Ny, [facility] Desk#: [phone] Cell#: [phone]

## 2018-10-08 NOTE — Telephone Encounter (Signed)
CSW spoke with disability determination and sent them updated progress notes for review  CSW will continue to follow and assist as needed  Jorge Ny, [facility] Desk#: [phone] Cell#: [phone]

## 2018-10-09 ENCOUNTER — Other Ambulatory Visit (HOSPITAL_COMMUNITY): Payer: Self-pay

## 2018-10-10 NOTE — Progress Notes (Signed)
I met Sabrina Holland at [employer].  I gave her the medications that I picked up from the pharmacy and her paperwork (income verification that she need for housing).  She said that she will call me later today; she's planning on going to [place] until Thursday.  She said that if she doesn't call me today then she'll call me later this week.

## 2018-10-18 ENCOUNTER — Other Ambulatory Visit: Payer: Self-pay

## 2018-10-18 ENCOUNTER — Ambulatory Visit (HOSPITAL_COMMUNITY)
Admission: RE | Admit: 2018-10-18 | Discharge: 2018-10-18 | Disposition: A | Discharge status: Home / Self Care | Payer: Medicaid Other | Source: Ambulatory Visit | Attending: Nurse Practitioner | Admitting: Nurse Practitioner

## 2018-10-18 ENCOUNTER — Other Ambulatory Visit (HOSPITAL_COMMUNITY): Payer: Self-pay

## 2018-10-18 ENCOUNTER — Encounter (HOSPITAL_COMMUNITY): Payer: Self-pay

## 2018-10-18 ENCOUNTER — Ambulatory Visit (HOSPITAL_BASED_OUTPATIENT_CLINIC_OR_DEPARTMENT_OTHER)
Admission: RE | Admit: 2018-10-18 | Discharge: 2018-10-18 | Disposition: A | Discharge status: Home / Self Care | Payer: Medicaid Other | Source: Ambulatory Visit | Attending: Adult Health | Admitting: Adult Health

## 2018-10-18 VITALS — BP 124/86 | HR 90 | Wt 125.2 lb

## 2018-10-18 DIAGNOSIS — E785 Hyperlipidemia, unspecified: Secondary | ICD-10-CM | POA: Diagnosis not present

## 2018-10-18 DIAGNOSIS — I428 Other cardiomyopathies: Secondary | ICD-10-CM

## 2018-10-18 DIAGNOSIS — Z79899 Other long term (current) drug therapy: Secondary | ICD-10-CM | POA: Diagnosis not present

## 2018-10-18 DIAGNOSIS — Z87891 Personal history of nicotine dependence: Secondary | ICD-10-CM | POA: Insufficient documentation

## 2018-10-18 DIAGNOSIS — Z8673 Personal history of transient ischemic attack (TIA), and cerebral infarction without residual deficits: Secondary | ICD-10-CM | POA: Diagnosis not present

## 2018-10-18 DIAGNOSIS — I130 Hypertensive heart and chronic kidney disease with heart failure and stage 1 through stage 4 chronic kidney disease, or unspecified chronic kidney disease: Secondary | ICD-10-CM | POA: Insufficient documentation

## 2018-10-18 DIAGNOSIS — Z792 Long term (current) use of antibiotics: Secondary | ICD-10-CM | POA: Insufficient documentation

## 2018-10-18 DIAGNOSIS — I5042 Chronic combined systolic (congestive) and diastolic (congestive) heart failure: Secondary | ICD-10-CM

## 2018-10-18 DIAGNOSIS — I480 Paroxysmal atrial fibrillation: Secondary | ICD-10-CM | POA: Insufficient documentation

## 2018-10-18 DIAGNOSIS — I10 Essential (primary) hypertension: Secondary | ICD-10-CM

## 2018-10-18 DIAGNOSIS — F1410 Cocaine abuse, uncomplicated: Secondary | ICD-10-CM | POA: Diagnosis not present

## 2018-10-18 DIAGNOSIS — I071 Rheumatic tricuspid insufficiency: Secondary | ICD-10-CM | POA: Insufficient documentation

## 2018-10-18 DIAGNOSIS — I4892 Unspecified atrial flutter: Secondary | ICD-10-CM | POA: Insufficient documentation

## 2018-10-18 DIAGNOSIS — Z791 Long term (current) use of non-steroidal anti-inflammatories (NSAID): Secondary | ICD-10-CM | POA: Diagnosis not present

## 2018-10-18 DIAGNOSIS — Z7901 Long term (current) use of anticoagulants: Secondary | ICD-10-CM | POA: Insufficient documentation

## 2018-10-18 DIAGNOSIS — Z7951 Long term (current) use of inhaled steroids: Secondary | ICD-10-CM | POA: Insufficient documentation

## 2018-10-18 DIAGNOSIS — I469 Cardiac arrest, cause unspecified: Secondary | ICD-10-CM | POA: Insufficient documentation

## 2018-10-18 DIAGNOSIS — F1910 Other psychoactive substance abuse, uncomplicated: Secondary | ICD-10-CM

## 2018-10-18 DIAGNOSIS — Z836 Family history of other diseases of the respiratory system: Secondary | ICD-10-CM | POA: Insufficient documentation

## 2018-10-18 DIAGNOSIS — Z590 Homelessness: Secondary | ICD-10-CM | POA: Insufficient documentation

## 2018-10-18 DIAGNOSIS — Z720 Tobacco use: Secondary | ICD-10-CM

## 2018-10-18 DIAGNOSIS — J449 Chronic obstructive pulmonary disease, unspecified: Secondary | ICD-10-CM | POA: Insufficient documentation

## 2018-10-18 DIAGNOSIS — N182 Chronic kidney disease, stage 2 (mild): Secondary | ICD-10-CM | POA: Diagnosis not present

## 2018-10-18 DIAGNOSIS — Z853 Personal history of malignant neoplasm of breast: Secondary | ICD-10-CM | POA: Diagnosis not present

## 2018-10-18 LAB — BASIC METABOLIC PANEL
Anion gap: 10 (ref 5–15)
BUN: 7 mg/dL (ref 6–20)
CO2: 25 mmol/L (ref 22–32)
Calcium: 9.5 mg/dL (ref 8.9–10.3)
Chloride: 103 mmol/L (ref 98–111)
Creatinine, Ser: 0.79 mg/dL (ref 0.44–1.00)
GFR calc Af Amer: >60 mL/min (ref >60)
GFR calc non Af Amer: >60 mL/min (ref >60)
Glucose, Bld: 104 mg/dL — ABNORMAL HIGH (ref 70–99)
Potassium: 4 mmol/L (ref 3.5–5.1)
Sodium: 138 mmol/L (ref 135–145)

## 2018-10-18 MED ORDER — CARVEDILOL 3.125 MG PO TABS: 3.1250 mg | ORAL_TABLET | Freq: Two times a day (BID) | ORAL | 3 refills | Status: DC

## 2018-10-18 MED FILL — ?CARVEDILOL 3.125 MG TABLET: 3.125 | 30 days supply | Qty: 60 | Fill #0

## 2018-10-18 NOTE — Patient Instructions (Addendum)
Lab work done today. We will notify you of any abnormal lab work. No news is good news!  START Carvedilol 3.125mg  tab two times daily.  Please follow up with the [facility] in 6 weeks.   At the [facility], you and your health needs are our priority. As part of our continuing mission to provide you with exceptional heart care, we have created designated Provider Care Teams. These Care Teams include your primary Cardiologist (physician) and Advanced Practice Providers (APPs- Physician Assistants and Nurse Practitioners) who all work together to provide you with the care you need, when you need it.   You may see any of the following providers on your designated Care Team at your next follow up: Marland Kitchen Dr Glori Bickers . Dr Loralie Champagne . Darrick Grinder, NP   Please be sure to bring in all your medications bottles to every appointment.

## 2018-10-18 NOTE — Progress Notes (Signed)
Paramedicine Encounter   Patient ID: Sabrina Holland , female,   DOB: July 02, 1970,48 y.o.,  MRN: [id number]   Met patient in clinic today with provider Amy.   Mrs. Vanderkolk has no complaints today. She's taken all of her medications for today and brought her pill box to this appointment.  She denies sob, chest pain and dizziness. rx bottles verified and pill box refilled in clinic.  CHW called and stated that her medications are ready for pick up.  I will pick them up for her tomorrow.    Start carvedilol 3.134m  Time spent with patient 25 mins   DFillmore EWestwood10/01/2018   ACTION: Home visit completed

## 2018-10-18 NOTE — Progress Notes (Signed)
  Echocardiogram 2D Echocardiogram has been performed.  Sabrina Holland 10/18/2018, 9:26 AM

## 2018-10-18 NOTE — Progress Notes (Signed)
PCP: [name] NP Cardiologist: Dr Sallyanne Kuster  Primary HF Cardiologist: Dr Haroldine Laws   HPI: Sabrina Pollins McLendonis a 48 y.o.femalewith a hx of ongoing polysubstance abuse (h/o cocaine, opiates, THC, tobacco abuse,priorhabitual ETOH), poor social situation,chronic combined CHF EF 25-30% (no prior ischemic w/u), HTN, mitral regurgitation, HLD,CKD II by labs,breast CA (s/p L mastectomy, chemo, tamoxifen), TIA, COPD.  She had a normal echocardiogram in 2013 as part of a work-up for TIA. In 06/2017  admitted with SOB and initially had left AMA. However, she returned and was found to have severe CHF with EF 25-30%, grade 3 DD. She was diuresed and started on guideline directed therapy. She has been following with Dr. Recardo Evangelist but therapy limited by her social situation and recurrent cocaine use. Last echo 11/2017 showed EF 25-30%, mild MR, mild LAE, PASP 71mmHg. She was admitted in 02/2018 for recurrent HF. She was not taking Lasix as prescribed due to frequent urination at work. She refused coronary CT or cath at that time She has subsequently followed up in telehealth with adjustment in meds. Dr. Sallyanne Kuster has suggested avoidance of beta blocker given continued cocaine use.   Presented to [facility] 07/03/2018 with increased SOB and chest tightness. +  For cocaine on admit. VF arrest on 6/19 with defibrillation x1 and intubation.ECHO showed severely reduced EF at 15%.  Treated for PNA.Had [place] wit normal cors. Discharged to home on 07/12/2018.   Today she returns for HF follow up. Last visit spiro was increased to 25 mg daily. Overall feeling fine. Short of breath every now and then. Denies PND/Orthopnea. Appetite ok. No fever or chills. Weight at home 120 pounds. Smoking 2-3 cigarettes per day. She has not used cocaine in 4 weeks. Rarely drinking alcohol. Taking all medications.  Followed by HF Paramedicine.   Echo 07/05/18 EF 15% RV moderately to severely down  LHC 07/10/18  normal cors.   ROS: All  systems negative except as listed in HPI, PMH and Problem List.  SH:  Social History   Socioeconomic History  . Marital status: Single    Spouse name: Not on file  . Number of children: 4  . Years of education: Not on file  . Highest education level: Not on file  Occupational History  . Occupation: trying to get disability    Employer: [employer]  Social Needs  . Financial resource strain: Not on file  . Food insecurity    Worry: Not on file    Inability: Not on file  . Transportation needs    Medical: Not on file    Non-medical: Not on file  Tobacco Use  . Smoking status: Former Smoker    Packs/day: 5.00    Years: 30.00    Pack years: 150.00    Types: Cigarettes  . Smokeless tobacco: Never Used  . Tobacco comment: 1 or 2 cigarettes a day- approx 1 pack week  Substance and Sexual Activity  . Alcohol use: Not Currently    Alcohol/week: 12.0 standard drinks    Types: 12 Cans of beer per week    Comment: last drink was 5/19  . Drug use: Yes    Types: Marijuana, Cocaine    Comment: daily marijuana; last use of cocaine 05/02/18 but selling it  . Sexual activity: Not Currently    Birth control/protection: Surgical  Lifestyle  . Physical activity    Days per week: Not on file    Minutes per session: Not on file  . Stress: Not on file  Relationships  .  Social Herbalist on phone: Not on file    Gets together: Not on file    Attends religious service: Not on file    Active member of club or organization: Not on file    Attends meetings of clubs or organizations: Not on file    Relationship status: Not on file  . Intimate partner violence    Fear of current or ex partner: Not on file    Emotionally abused: Not on file    Physically abused: Not on file    Forced sexual activity: Not on file  Other Topics Concern  . Not on file  Social History Narrative   ** Merged History Encounter **        FH:  Family History  Problem Relation Age of Onset  . COPD  Father   . Sickle cell anemia Cousin        maternal cousin    Past Medical History:  Diagnosis Date  . Atrial flutter ([place]) 07/03/2018  . Breast cancer, stage 2 ([place]) 02/23/2011  . Chronic combined systolic and diastolic CHF (congestive heart failure) ([place])   . CKD (chronic kidney disease), stage II   . Cocaine use   . COPD (chronic obstructive pulmonary disease) ([place])   . Homelessness   . Hypertension   . Polysubstance abuse ([place])   . Poor social situation   . TIA (transient ischemic attack) 11/25/2011   "first time" (11/25/2011)    Current Outpatient Medications  Medication Sig Dispense Refill  . albuterol (VENTOLIN HFA) 108 (90 Base) MCG/ACT inhaler Inhale 2 puffs into the lungs every 6 (six) hours as needed for wheezing or shortness of breath (cough). Will pick up scripts tomorrow 7-9. 18 g 1  . amiodarone (PACERONE) 200 MG tablet Take 1 tablet (200 mg total) by mouth daily. 60 tablet 0  . apixaban (ELIQUIS) 5 MG TABS tablet Take 1 tablet (5 mg total) by mouth 2 (two) times daily. Patient will pick up scripts today. 180 tablet 3  . budesonide-formoterol (SYMBICORT) 80-4.5 MCG/ACT inhaler Inhale 2 puffs into the lungs 2 (two) times daily. 1 Inhaler 6  . diclofenac sodium (VOLTAREN) 1 % GEL Apply 2 g topically 4 (four) times daily. 100 g 1  . digoxin (LANOXIN) 0.125 MG tablet Take 1 tablet (0.125 mg total) by mouth daily. 30 tablet 0  . ferrous sulfate 325 (65 FE) MG tablet Take 1 tablet (325 mg total) by mouth 2 (two) times daily with a meal. 60 tablet 0  . fluticasone (FLONASE) 50 MCG/ACT nasal spray Place 2 sprays into both nostrils daily. 16 g 0  . folic acid (FOLVITE) 1 MG tablet Take 1 tablet (1 mg total) by mouth daily. 30 tablet 2  . lidocaine (LIDODERM) 5 % Place 1 patch onto the skin daily. Remove & Discard patch within 12 hours or as directed by MD 30 patch 0  . megestrol (MEGACE) 400 MG/10ML suspension Take 10 mLs (400 mg total) by mouth 2 (two) times daily. 240 mL 0  .  metroNIDAZOLE (FLAGYL) 500 MG tablet Take 1 tablet (500 mg total) by mouth 2 (two) times daily. 14 tablet 0  . mouth rinse LIQD solution 15 mLs by Mouth Rinse route 2 (two) times daily. 44 mL 0  . phenol (CHLORASEPTIC) 1.4 % LIQD Use as directed 1 spray in the mouth or throat as needed for throat irritation / pain. 20 mL 0  . sacubitril-valsartan (ENTRESTO) 49-51 MG Take 1 tablet by  mouth 2 (two) times daily. 60 tablet 3  . sertraline (ZOLOFT) 50 MG tablet Take 1 tablet (50 mg total) by mouth daily. 30 tablet 3  . spironolactone (ALDACTONE) 25 MG tablet Take 1 tablet (25 mg total) by mouth daily. 30 tablet 1  . thiamine 100 MG tablet Take 1 tablet (100 mg total) by mouth daily. 30 tablet 0  . tiotropium (SPIRIVA HANDIHALER) 18 MCG inhalation capsule Place 1 capsule (18 mcg total) into inhaler and inhale daily. 30 capsule 11   No current facility-administered medications for this encounter.     Vitals:   10/18/18 0940  BP: 124/86  Pulse: 90  SpO2: 98%  Weight: 56.8 kg (125 lb 3.2 oz)   Wt Readings from Last 3 Encounters:  10/18/18 56.8 kg (125 lb 3.2 oz)  10/04/18 56.7 kg (125 lb)  09/28/18 57.6 kg (127 lb)    PHYSICAL EXAM: General:  Well appearing. No resp difficulty. Walked in the clinic HEENT: normal Neck: supple. no JVD. Carotids 2+ bilat; no bruits. No lymphadenopathy or thryomegaly appreciated. Cor: PMI nondisplaced. Regular rate & rhythm. No rubs, gallops or murmurs. Lungs: clear Abdomen: soft, nontender, nondistended. No hepatosplenomegaly. No bruits or masses. Good bowel sounds. Extremities: no cyanosis, clubbing, rash, edema Neuro: alert & orientedx3, cranial nerves grossly intact. moves all 4 extremities w/o difficulty. Affect pleasant   ASSESSMENT & PLAN: 1. Chronic systolic HF  - Longstanding  Echo 07/05/18 EF 15% RV moderately to severely downPersonally reviewed. Suspect due to polysubstance abuse +/- tachy-induced. - Cath 6/23 normal cors.   - NYHA II. Volume  status stable.   - Add carvedilol 3.125 mg twice a day.  - Continue entresto 49-51  mg twice a day.  - Continue spiro 25 mg daily.  - Continue dig. -Check BMET.  - Not candidate for home inotropes or advanced therapies due to ongoing drug use  2. Cardiac arrest VFF/TdP  - s/p Code Blue 6/19 with defib x 1  3. PAF -Regular on exam.  - Continue amio 200 mg daily.  - Continue eliquis 5 mg twice a day.   4. Polysubstance abuse/cocaine abuse - longstanding.She has not used cocaine in the last 4 weeks. Congratulated.  -SW following and has referred to outpatient programs. She does not want inpatient treatment.   5. Tobacco Abuse.  Discussed smoking cessation.   Check BMET today. Follow up in 6 weeks.  Continue HF Paramedicine. ECHO results from today pending.    Torren Maffeo NP-C  9:40 AM

## 2018-10-24 ENCOUNTER — Other Ambulatory Visit (HOSPITAL_COMMUNITY): Payer: Self-pay

## 2018-10-24 NOTE — Progress Notes (Signed)
Paramedicine Encounter    Patient ID: Sabrina Holland, female    DOB: 06-08-1970, 48 y.o.   MRN: [id number]    Patient Care Team: Gildardo Pounds, NP as PCP - General (Nurse Practitioner) Sanda Klein, MD as PCP - Cardiology (Cardiology)  Patient Active Problem List   Diagnosis Date Noted  . Palliative care by specialist   . Goals of care, counseling/discussion   . Acute respiratory failure ([place])   . Endotracheally intubated   . Torsades de pointes ([place])   . Cardiac arrest ([place])   . Atrial flutter ([place]) 07/03/2018  . Elevated LFTs 04/18/2018  . Malnutrition of moderate degree 02/27/2018  . Dyspnea 02/26/2018  . Acute on chronic systolic and diastolic heart failure, NYHA class 3 ([place]) 01/20/2018  . Cocaine abuse ([place]) 12/19/2017  . Mixed hyperlipidemia 12/19/2017  . COPD exacerbation ([place])   . Acute on chronic respiratory failure with hypoxemia ([place]) 12/03/2017  . Cardiomyopathy ([place]) 07/04/2017  . COPD (chronic obstructive pulmonary disease) ([place])   . Chronic combined systolic and diastolic heart failure ([place])   . Anemia 06/20/2017  . Polysubstance abuse ([place]) 11/26/2011  . TIA (transient ischemic attack) 11/25/2011  . Essential hypertension 11/25/2011  . Sinusitis 11/25/2011  . Vitamin B 12 deficiency 07/18/2011  . History of breast cancer 02/23/2011    Current Outpatient Medications:  .  albuterol (VENTOLIN HFA) 108 (90 Base) MCG/ACT inhaler, Inhale 2 puffs into the lungs every 6 (six) hours as needed for wheezing or shortness of breath (cough). Will pick up scripts tomorrow 7-9., Disp: 18 g, Rfl: 1 .  amiodarone (PACERONE) 200 MG tablet, Take 1 tablet (200 mg total) by mouth daily., Disp: 60 tablet, Rfl: 0 .  apixaban (ELIQUIS) 5 MG TABS tablet, Take 1 tablet (5 mg total) by mouth 2 (two) times daily. Patient will pick up scripts today., Disp: 180 tablet, Rfl: 3 .  budesonide-formoterol (SYMBICORT) 80-4.5 MCG/ACT inhaler, Inhale 2 puffs into the lungs 2 (two)  times daily., Disp: 1 Inhaler, Rfl: 6 .  carvedilol (COREG) 3.125 MG tablet, Take 1 tablet (3.125 mg total) by mouth 2 (two) times daily., Disp: 180 tablet, Rfl: 3 .  diclofenac sodium (VOLTAREN) 1 % GEL, Apply 2 g topically 4 (four) times daily., Disp: 100 g, Rfl: 1 .  digoxin (LANOXIN) 0.125 MG tablet, Take 1 tablet (0.125 mg total) by mouth daily., Disp: 30 tablet, Rfl: 0 .  ferrous sulfate 325 (65 FE) MG tablet, Take 1 tablet (325 mg total) by mouth 2 (two) times daily with a meal., Disp: 60 tablet, Rfl: 0 .  fluticasone (FLONASE) 50 MCG/ACT nasal spray, Place 2 sprays into both nostrils daily., Disp: 16 g, Rfl: 0 .  folic acid (FOLVITE) 1 MG tablet, Take 1 tablet (1 mg total) by mouth daily., Disp: 30 tablet, Rfl: 2 .  lidocaine (LIDODERM) 5 %, Place 1 patch onto the skin daily. Remove & Discard patch within 12 hours or as directed by MD, Disp: 30 patch, Rfl: 0 .  megestrol (MEGACE) 400 MG/10ML suspension, Take 10 mLs (400 mg total) by mouth 2 (two) times daily., Disp: 240 mL, Rfl: 0 .  metroNIDAZOLE (FLAGYL) 500 MG tablet, Take 1 tablet (500 mg total) by mouth 2 (two) times daily., Disp: 14 tablet, Rfl: 0 .  mouth rinse LIQD solution, 15 mLs by Mouth Rinse route 2 (two) times daily., Disp: 44 mL, Rfl: 0 .  phenol (CHLORASEPTIC) 1.4 % LIQD, Use as directed 1 spray in the mouth or throat  as needed for throat irritation / pain., Disp: 20 mL, Rfl: 0 .  sacubitril-valsartan (ENTRESTO) 49-51 MG, Take 1 tablet by mouth 2 (two) times daily., Disp: 60 tablet, Rfl: 3 .  sertraline (ZOLOFT) 50 MG tablet, Take 1 tablet (50 mg total) by mouth daily., Disp: 30 tablet, Rfl: 3 .  spironolactone (ALDACTONE) 25 MG tablet, Take 1 tablet (25 mg total) by mouth daily., Disp: 30 tablet, Rfl: 1 .  thiamine 100 MG tablet, Take 1 tablet (100 mg total) by mouth daily., Disp: 30 tablet, Rfl: 0 .  tiotropium (SPIRIVA HANDIHALER) 18 MCG inhalation capsule, Place 1 capsule (18 mcg total) into inhaler and inhale daily.,  Disp: 30 capsule, Rfl: 11 Allergies  Allergen Reactions  . Penicillins Other (See Comments)    Pt had slight throat swelling Has patient had a PCN reaction causing immediate rash, facial/tongue/throat swelling, SOB or lightheadedness with hypotension: yes Has patient had a PCN reaction causing severe rash involving mucus membranes or skin necrosis: No Has patient had a PCN reaction that required hospitalization No Has patient had a PCN reaction occurring within the last 10 years: No If all of the above answers are "NO", then may proceed with Cephalosporin use.      Social History   Socioeconomic History  . Marital status: Single    Spouse name: Not on file  . Number of children: 4  . Years of education: Not on file  . Highest education level: Not on file  Occupational History  . Occupation: trying to get disability    Employer: [employer]  Social Needs  . Financial resource strain: Not on file  . Food insecurity    Worry: Not on file    Inability: Not on file  . Transportation needs    Medical: Not on file    Non-medical: Not on file  Tobacco Use  . Smoking status: Former Smoker    Packs/day: 5.00    Years: 30.00    Pack years: 150.00    Types: Cigarettes  . Smokeless tobacco: Never Used  . Tobacco comment: 1 or 2 cigarettes a day- approx 1 pack week  Substance and Sexual Activity  . Alcohol use: Not Currently    Alcohol/week: 12.0 standard drinks    Types: 12 Cans of beer per week    Comment: last drink was 5/19  . Drug use: Yes    Types: Marijuana, Cocaine    Comment: daily marijuana; last use of cocaine 05/02/18 but selling it  . Sexual activity: Not Currently    Birth control/protection: Surgical  Lifestyle  . Physical activity    Days per week: Not on file    Minutes per session: Not on file  . Stress: Not on file  Relationships  . Social Herbalist on phone: Not on file    Gets together: Not on file    Attends religious service: Not on file     Active member of club or organization: Not on file    Attends meetings of clubs or organizations: Not on file    Relationship status: Not on file  . Intimate partner violence    Fear of current or ex partner: Not on file    Emotionally abused: Not on file    Physically abused: Not on file    Forced sexual activity: Not on file  Other Topics Concern  . Not on file  Social History Narrative   ** Merged History Encounter **  Physical Exam      Future Appointments  Date Time Provider [place]  11/30/2018 10:30 AM [facility] PA/NP [facility] None     BP 118/76 (BP Location: Right Arm, Patient Position: Sitting, Cuff Size: Normal)   Pulse 81   Temp 97.8 F (36.6 C)   Wt 123 lb (55.8 kg)   LMP 02/18/2015 (Exact Date) Comment: MAYBE ONCE A YEAR  SpO2 98%   BMI 20.47 kg/m   Weight yesterday-didn't weigh Last visit weight-125 (heart failure appt)  ATF pt CAO x4 with no complaints.  She's taken all of her meds for the past week.  She denies sob, chest pain and dizziness. She said that she's been very active lately and she doesn't need to take a break as much as before.  rx bottles verified and pill  Box refilled.    Medication ordered:  Vincient Vanaman, EMT Paramedic [phone] 10/24/2018    ACTION: Home visit completed

## 2018-11-01 ENCOUNTER — Other Ambulatory Visit (HOSPITAL_COMMUNITY): Payer: Self-pay | Admitting: *Deleted

## 2018-11-01 ENCOUNTER — Other Ambulatory Visit (HOSPITAL_COMMUNITY): Payer: Self-pay

## 2018-11-01 MED ORDER — DIGOXIN 125 MCG PO TABS: 0.1250 mg | ORAL_TABLET | Freq: Every day | ORAL | 3 refills | Status: DC

## 2018-11-01 MED ORDER — AMIODARONE HCL 200 MG PO TABS: 200.0000 mg | ORAL_TABLET | Freq: Every day | ORAL | 3 refills | Status: DC

## 2018-11-01 MED ORDER — FERROUS SULFATE 325 (65 FE) MG PO TABS: 325.0000 mg | ORAL_TABLET | Freq: Two times a day (BID) | ORAL | 3 refills | Status: DC

## 2018-11-01 MED ORDER — FERROUS SULFATE 325 (65 FE) MG PO TABS: 325.0000 mg | ORAL_TABLET | Freq: Two times a day (BID) | ORAL | 0 refills | Status: DC

## 2018-11-01 MED FILL — DIGOXIN 0.125 MG TABLET: 125 | 30 days supply | Qty: 30 | Fill #0

## 2018-11-01 MED FILL — AMIODARONE HCL 200 MG TAB: 200 | 30 days supply | Qty: 30 | Fill #0

## 2018-11-01 MED FILL — FERROUS SULFATE 325 MG TAB: 325 (65 FE) | 30 days supply | Qty: 60 | Fill #0

## 2018-11-01 MED FILL — ?DIGITEK 125 MCG TABLET: 125 | 30 days supply | Qty: 30 | Fill #0

## 2018-11-01 NOTE — Progress Notes (Signed)
Paramedicine Encounter    Patient ID: Sabrina Holland, female    DOB: 1970-05-03, 48 y.o.   MRN: [id number]    Patient Care Team: Gildardo Pounds, NP as PCP - General (Nurse Practitioner) Sanda Klein, MD as PCP - Cardiology (Cardiology)  Patient Active Problem List   Diagnosis Date Noted  . Palliative care by specialist   . Goals of care, counseling/discussion   . Acute respiratory failure ([place])   . Endotracheally intubated   . Torsades de pointes ([place])   . Cardiac arrest ([place])   . Atrial flutter ([place]) 07/03/2018  . Elevated LFTs 04/18/2018  . Malnutrition of moderate degree 02/27/2018  . Dyspnea 02/26/2018  . Acute on chronic systolic and diastolic heart failure, NYHA class 3 ([place]) 01/20/2018  . Cocaine abuse ([place]) 12/19/2017  . Mixed hyperlipidemia 12/19/2017  . COPD exacerbation ([place])   . Acute on chronic respiratory failure with hypoxemia ([place]) 12/03/2017  . Cardiomyopathy ([place]) 07/04/2017  . COPD (chronic obstructive pulmonary disease) ([place])   . Chronic combined systolic and diastolic heart failure ([place])   . Anemia 06/20/2017  . Polysubstance abuse ([place]) 11/26/2011  . TIA (transient ischemic attack) 11/25/2011  . Essential hypertension 11/25/2011  . Sinusitis 11/25/2011  . Vitamin B 12 deficiency 07/18/2011  . History of breast cancer 02/23/2011    Current Outpatient Medications:  .  apixaban (ELIQUIS) 5 MG TABS tablet, Take 1 tablet (5 mg total) by mouth 2 (two) times daily. Patient will pick up scripts today., Disp: 180 tablet, Rfl: 3 .  carvedilol (COREG) 3.125 MG tablet, Take 1 tablet (3.125 mg total) by mouth 2 (two) times daily., Disp: 180 tablet, Rfl: 3 .  folic acid (FOLVITE) 1 MG tablet, Take 1 tablet (1 mg total) by mouth daily., Disp: 30 tablet, Rfl: 2 .  sacubitril-valsartan (ENTRESTO) 49-51 MG, Take 1 tablet by mouth 2 (two) times daily., Disp: 60 tablet, Rfl: 3 .  sertraline (ZOLOFT) 50 MG tablet, Take 1 tablet (50 mg total) by mouth daily.,  Disp: 30 tablet, Rfl: 3 .  spironolactone (ALDACTONE) 25 MG tablet, Take 1 tablet (25 mg total) by mouth daily., Disp: 30 tablet, Rfl: 1 .  thiamine 100 MG tablet, Take 1 tablet (100 mg total) by mouth daily., Disp: 30 tablet, Rfl: 0 .  albuterol (VENTOLIN HFA) 108 (90 Base) MCG/ACT inhaler, Inhale 2 puffs into the lungs every 6 (six) hours as needed for wheezing or shortness of breath (cough). Will pick up scripts tomorrow 7-9., Disp: 18 g, Rfl: 1 .  amiodarone (PACERONE) 200 MG tablet, Take 1 tablet (200 mg total) by mouth daily., Disp: 60 tablet, Rfl: 3 .  budesonide-formoterol (SYMBICORT) 80-4.5 MCG/ACT inhaler, Inhale 2 puffs into the lungs 2 (two) times daily., Disp: 1 Inhaler, Rfl: 6 .  diclofenac sodium (VOLTAREN) 1 % GEL, Apply 2 g topically 4 (four) times daily., Disp: 100 g, Rfl: 1 .  digoxin (LANOXIN) 0.125 MG tablet, Take 1 tablet (0.125 mg total) by mouth daily., Disp: 30 tablet, Rfl: 3 .  ferrous sulfate 325 (65 FE) MG tablet, Take 1 tablet (325 mg total) by mouth 2 (two) times daily with a meal., Disp: 60 tablet, Rfl: 0 .  fluticasone (FLONASE) 50 MCG/ACT nasal spray, Place 2 sprays into both nostrils daily., Disp: 16 g, Rfl: 0 .  lidocaine (LIDODERM) 5 %, Place 1 patch onto the skin daily. Remove & Discard patch within 12 hours or as directed by MD, Disp: 30 patch, Rfl: 0 .  megestrol (MEGACE)  400 MG/10ML suspension, Take 10 mLs (400 mg total) by mouth 2 (two) times daily., Disp: 240 mL, Rfl: 0 .  metroNIDAZOLE (FLAGYL) 500 MG tablet, Take 1 tablet (500 mg total) by mouth 2 (two) times daily., Disp: 14 tablet, Rfl: 0 .  mouth rinse LIQD solution, 15 mLs by Mouth Rinse route 2 (two) times daily., Disp: 44 mL, Rfl: 0 .  phenol (CHLORASEPTIC) 1.4 % LIQD, Use as directed 1 spray in the mouth or throat as needed for throat irritation / pain., Disp: 20 mL, Rfl: 0 .  tiotropium (SPIRIVA HANDIHALER) 18 MCG inhalation capsule, Place 1 capsule (18 mcg total) into inhaler and inhale daily.,  Disp: 30 capsule, Rfl: 11 Allergies  Allergen Reactions  . Penicillins Other (See Comments)    Pt had slight throat swelling Has patient had a PCN reaction causing immediate rash, facial/tongue/throat swelling, SOB or lightheadedness with hypotension: yes Has patient had a PCN reaction causing severe rash involving mucus membranes or skin necrosis: No Has patient had a PCN reaction that required hospitalization No Has patient had a PCN reaction occurring within the last 10 years: No If all of the above answers are "NO", then may proceed with Cephalosporin use.      Social History   Socioeconomic History  . Marital status: Single    Spouse name: Not on file  . Number of children: 4  . Years of education: Not on file  . Highest education level: Not on file  Occupational History  . Occupation: trying to get disability    Employer: [employer]  Social Needs  . Financial resource strain: Not on file  . Food insecurity    Worry: Not on file    Inability: Not on file  . Transportation needs    Medical: Not on file    Non-medical: Not on file  Tobacco Use  . Smoking status: Former Smoker    Packs/day: 5.00    Years: 30.00    Pack years: 150.00    Types: Cigarettes  . Smokeless tobacco: Never Used  . Tobacco comment: 1 or 2 cigarettes a day- approx 1 pack week  Substance and Sexual Activity  . Alcohol use: Not Currently    Alcohol/week: 12.0 standard drinks    Types: 12 Cans of beer per week    Comment: last drink was 5/19  . Drug use: Yes    Types: Marijuana, Cocaine    Comment: daily marijuana; last use of cocaine 05/02/18 but selling it  . Sexual activity: Not Currently    Birth control/protection: Surgical  Lifestyle  . Physical activity    Days per week: Not on file    Minutes per session: Not on file  . Stress: Not on file  Relationships  . Social Herbalist on phone: Not on file    Gets together: Not on file    Attends religious service: Not on file     Active member of club or organization: Not on file    Attends meetings of clubs or organizations: Not on file    Relationship status: Not on file  . Intimate partner violence    Fear of current or ex partner: Not on file    Emotionally abused: Not on file    Physically abused: Not on file    Forced sexual activity: Not on file  Other Topics Concern  . Not on file  Social History Narrative   ** Merged History Encounter **  Physical Exam Pulmonary:     Effort: No respiratory distress.     Breath sounds: No wheezing or rales.  Abdominal:     General: There is no distension.  Skin:    General: Skin is warm and dry.         Future Appointments  Date Time Provider [place]  11/30/2018 10:30 AM [facility] PA/NP [facility] None     BP 122/78 (BP Location: Right Arm, Patient Position: Sitting, Cuff Size: Normal)   Pulse 73   Temp 97.9 F (36.6 C)   LMP 02/18/2015 (Exact Date) Comment: MAYBE ONCE A YEAR  SpO2 98%   Weight yesterday- didn't weigh Last visit weight-123  ATF pt CAO x4 w/no complaints.  She's taken all of her medications this week. Mrs. Lantis denies sob, chest pain and dizziness. She denies increase in sob while doing housework, however she does while walking long distance.  She said that she couldn't weigh because her scale battery is dead.   rx bottles verified and pill box refilled.    Medication ordered: Amiodarone  Ferrous sulfate Digoxin Thiamine  Carlyn Lemke, EMT Paramedic [phone] 11/02/2018    ACTION: Home visit completed

## 2018-11-02 ENCOUNTER — Other Ambulatory Visit (HOSPITAL_COMMUNITY): Payer: Self-pay

## 2018-11-02 NOTE — Progress Notes (Signed)
[facility]  Sabrina Holland is not home but she gave me permission to leave the medications with her daughter (which I did)

## 2018-11-07 ENCOUNTER — Telehealth: Payer: Self-pay

## 2018-11-07 NOTE — Telephone Encounter (Signed)
Per Abelino Derrick, [employer] [place], they have not been able to reach the patient and has closed the case

## 2018-11-09 ENCOUNTER — Other Ambulatory Visit (HOSPITAL_COMMUNITY): Payer: Self-pay

## 2018-11-09 MED FILL — ?FOLIC ACID 1MG TAB: 1 | 30 days supply | Qty: 30 | Fill #2

## 2018-11-09 NOTE — Progress Notes (Signed)
Paramedicine Encounter    Patient ID: Sabrina Holland, female    DOB: [birth year], 48 y.o.   MRN: [id number]    Patient Care Team: Gildardo Pounds, NP as PCP - General (Nurse Practitioner) Sanda Klein, MD as PCP - Cardiology (Cardiology)  Patient Active Problem List   Diagnosis Date Noted  . Palliative care by specialist   . Goals of care, counseling/discussion   . Acute respiratory failure ([place])   . Endotracheally intubated   . Torsades de pointes ([place])   . Cardiac arrest ([place])   . Atrial flutter ([place]) 07/03/2018  . Elevated LFTs 04/18/2018  . Malnutrition of moderate degree 02/27/2018  . Dyspnea 02/26/2018  . Acute on chronic systolic and diastolic heart failure, NYHA class 3 ([place]) 01/20/2018  . Cocaine abuse ([place]) 12/19/2017  . Mixed hyperlipidemia 12/19/2017  . COPD exacerbation ([place])   . Acute on chronic respiratory failure with hypoxemia ([place]) 12/03/2017  . Cardiomyopathy ([place]) 07/04/2017  . COPD (chronic obstructive pulmonary disease) ([place])   . Chronic combined systolic and diastolic heart failure ([place])   . Anemia 06/20/2017  . Polysubstance abuse ([place]) 11/26/2011  . TIA (transient ischemic attack) 11/25/2011  . Essential hypertension 11/25/2011  . Sinusitis 11/25/2011  . Vitamin B 12 deficiency 07/18/2011  . History of breast cancer 02/23/2011    Current Outpatient Medications:  .  amiodarone (PACERONE) 200 MG tablet, Take 1 tablet (200 mg total) by mouth daily., Disp: 60 tablet, Rfl: 3 .  apixaban (ELIQUIS) 5 MG TABS tablet, Take 1 tablet (5 mg total) by mouth 2 (two) times daily. Patient will pick up scripts today., Disp: 180 tablet, Rfl: 3 .  carvedilol (COREG) 3.125 MG tablet, Take 1 tablet (3.125 mg total) by mouth 2 (two) times daily., Disp: 180 tablet, Rfl: 3 .  digoxin (LANOXIN) 0.125 MG tablet, Take 1 tablet (0.125 mg total) by mouth daily., Disp: 30 tablet, Rfl: 3 .  ferrous sulfate 325 (65 FE) MG tablet, Take 1 tablet (325 mg total) by mouth 2  (two) times daily with a meal., Disp: 60 tablet, Rfl: 0 .  folic acid (FOLVITE) 1 MG tablet, Take 1 tablet (1 mg total) by mouth daily., Disp: 30 tablet, Rfl: 2 .  sacubitril-valsartan (ENTRESTO) 49-51 MG, Take 1 tablet by mouth 2 (two) times daily., Disp: 60 tablet, Rfl: 3 .  sertraline (ZOLOFT) 50 MG tablet, Take 1 tablet (50 mg total) by mouth daily., Disp: 30 tablet, Rfl: 3 .  spironolactone (ALDACTONE) 25 MG tablet, Take 1 tablet (25 mg total) by mouth daily., Disp: 30 tablet, Rfl: 1 .  albuterol (VENTOLIN HFA) 108 (90 Base) MCG/ACT inhaler, Inhale 2 puffs into the lungs every 6 (six) hours as needed for wheezing or shortness of breath (cough). Will pick up scripts tomorrow 7-9., Disp: 18 g, Rfl: 1 .  budesonide-formoterol (SYMBICORT) 80-4.5 MCG/ACT inhaler, Inhale 2 puffs into the lungs 2 (two) times daily., Disp: 1 Inhaler, Rfl: 6 .  diclofenac sodium (VOLTAREN) 1 % GEL, Apply 2 g topically 4 (four) times daily., Disp: 100 g, Rfl: 1 .  fluticasone (FLONASE) 50 MCG/ACT nasal spray, Place 2 sprays into both nostrils daily., Disp: 16 g, Rfl: 0 .  lidocaine (LIDODERM) 5 %, Place 1 patch onto the skin daily. Remove & Discard patch within 12 hours or as directed by MD, Disp: 30 patch, Rfl: 0 .  megestrol (MEGACE) 400 MG/10ML suspension, Take 10 mLs (400 mg total) by mouth 2 (two) times daily., Disp: 240 mL, Rfl: 0 .  metroNIDAZOLE (FLAGYL) 500 MG tablet, Take 1 tablet (500 mg total) by mouth 2 (two) times daily., Disp: 14 tablet, Rfl: 0 .  mouth rinse LIQD solution, 15 mLs by Mouth Rinse route 2 (two) times daily., Disp: 44 mL, Rfl: 0 .  phenol (CHLORASEPTIC) 1.4 % LIQD, Use as directed 1 spray in the mouth or throat as needed for throat irritation / pain., Disp: 20 mL, Rfl: 0 .  thiamine 100 MG tablet, Take 1 tablet (100 mg total) by mouth daily., Disp: 30 tablet, Rfl: 0 .  tiotropium (SPIRIVA HANDIHALER) 18 MCG inhalation capsule, Place 1 capsule (18 mcg total) into inhaler and inhale daily., Disp:  30 capsule, Rfl: 11 Allergies  Allergen Reactions  . Penicillins Other (See Comments)    Pt had slight throat swelling Has patient had a PCN reaction causing immediate rash, facial/tongue/throat swelling, SOB or lightheadedness with hypotension: yes Has patient had a PCN reaction causing severe rash involving mucus membranes or skin necrosis: No Has patient had a PCN reaction that required hospitalization No Has patient had a PCN reaction occurring within the last 10 years: No If all of the above answers are "NO", then may proceed with Cephalosporin use.      Social History   Socioeconomic History  . Marital status: Single    Spouse name: Not on file  . Number of children: 4  . Years of education: Not on file  . Highest education level: Not on file  Occupational History  . Occupation: trying to get disability    Employer: [employer]  Social Needs  . Financial resource strain: Not on file  . Food insecurity    Worry: Not on file    Inability: Not on file  . Transportation needs    Medical: Not on file    Non-medical: Not on file  Tobacco Use  . Smoking status: Former Smoker    Packs/day: 5.00    Years: 30.00    Pack years: 150.00    Types: Cigarettes  . Smokeless tobacco: Never Used  . Tobacco comment: 1 or 2 cigarettes a day- approx 1 pack week  Substance and Sexual Activity  . Alcohol use: Not Currently    Alcohol/week: 12.0 standard drinks    Types: 12 Cans of beer per week    Comment: last drink was 5/19  . Drug use: Yes    Types: Marijuana, Cocaine    Comment: daily marijuana; last use of cocaine 05/02/18 but selling it  . Sexual activity: Not Currently    Birth control/protection: Surgical  Lifestyle  . Physical activity    Days per week: Not on file    Minutes per session: Not on file  . Stress: Not on file  Relationships  . Social Herbalist on phone: Not on file    Gets together: Not on file    Attends religious service: Not on file     Active member of club or organization: Not on file    Attends meetings of clubs or organizations: Not on file    Relationship status: Not on file  . Intimate partner violence    Fear of current or ex partner: Not on file    Emotionally abused: Not on file    Physically abused: Not on file    Forced sexual activity: Not on file  Other Topics Concern  . Not on file  Social History Narrative   ** Merged History Encounter **  Physical Exam Pulmonary:     Effort: Pulmonary effort is normal. No respiratory distress.     Breath sounds: No wheezing or rales.  Abdominal:     General: Abdomen is flat.  Musculoskeletal:     Right lower leg: No edema.     Left lower leg: No edema.  Skin:    General: Skin is warm and dry.         Future Appointments  Date Time Provider [place]  11/30/2018 10:30 AM [facility] PA/NP [facility] None     BP 110/60   Pulse 89   Temp 97.6 F (36.4 C)   Wt 130 lb (59 kg)   LMP 02/18/2015 (Exact Date) Comment: MAYBE ONCE A YEAR  SpO2 98%   BMI 21.63 kg/m   Weight yesterday-129 Last visit weight-123 (10/07)  ATF pt CAO x4 sitting outside.  She's been approved for medicaid dating back to July this year.  She's taken all of her medications for the past week.  She has picked up "a few hours" at a local bakery.  She denies sob, chest pain and dizziness.  rx bottles verified and pill box refilled.    Medication ordered: Folic acid  Sabrina Holland, EMT Paramedic [phone] 11/09/2018    ACTION: Home visit completed

## 2018-11-16 MED FILL — ?SERTRALINE HCL 25MG TABS: 25 | 30 days supply | Qty: 60 | Fill #2

## 2018-11-16 MED FILL — ?CARVEDILOL 3.125 MG TABLET: 3.125 | 30 days supply | Qty: 60 | Fill #1

## 2018-11-22 DIAGNOSIS — H16223 Keratoconjunctivitis sicca, not specified as Sjogren's, bilateral: Secondary | ICD-10-CM | POA: Diagnosis not present

## 2018-11-22 DIAGNOSIS — H40033 Anatomical narrow angle, bilateral: Secondary | ICD-10-CM | POA: Diagnosis not present

## 2018-11-23 ENCOUNTER — Other Ambulatory Visit (HOSPITAL_COMMUNITY): Payer: Self-pay

## 2018-11-23 NOTE — Progress Notes (Signed)
Paramedicine Encounter    Patient ID: Sabrina Holland, female    DOB: Apr 22, 1970, 48 y.o.   MRN: [id number]    Patient Care Team: Gildardo Pounds, NP as PCP - General (Nurse Practitioner) Sanda Klein, MD as PCP - Cardiology (Cardiology)  Patient Active Problem List   Diagnosis Date Noted  . Palliative care by specialist   . Goals of care, counseling/discussion   . Acute respiratory failure ([place])   . Endotracheally intubated   . Torsades de pointes ([place])   . Cardiac arrest ([place])   . Atrial flutter ([place]) 07/03/2018  . Elevated LFTs 04/18/2018  . Malnutrition of moderate degree 02/27/2018  . Dyspnea 02/26/2018  . Acute on chronic systolic and diastolic heart failure, NYHA class 3 ([place]) 01/20/2018  . Cocaine abuse ([place]) 12/19/2017  . Mixed hyperlipidemia 12/19/2017  . COPD exacerbation ([place])   . Acute on chronic respiratory failure with hypoxemia ([place]) 12/03/2017  . Cardiomyopathy ([place]) 07/04/2017  . COPD (chronic obstructive pulmonary disease) ([place])   . Chronic combined systolic and diastolic heart failure ([place])   . Anemia 06/20/2017  . Polysubstance abuse ([place]) 11/26/2011  . TIA (transient ischemic attack) 11/25/2011  . Essential hypertension 11/25/2011  . Sinusitis 11/25/2011  . Vitamin B 12 deficiency 07/18/2011  . History of breast cancer 02/23/2011    Current Outpatient Medications:  .  amiodarone (PACERONE) 200 MG tablet, Take 1 tablet (200 mg total) by mouth daily., Disp: 60 tablet, Rfl: 3 .  apixaban (ELIQUIS) 5 MG TABS tablet, Take 1 tablet (5 mg total) by mouth 2 (two) times daily. Patient will pick up scripts today., Disp: 180 tablet, Rfl: 3 .  carvedilol (COREG) 3.125 MG tablet, Take 1 tablet (3.125 mg total) by mouth 2 (two) times daily., Disp: 180 tablet, Rfl: 3 .  digoxin (LANOXIN) 0.125 MG tablet, Take 1 tablet (0.125 mg total) by mouth daily., Disp: 30 tablet, Rfl: 3 .  ferrous sulfate 325 (65 FE) MG tablet, Take 1 tablet (325 mg total) by mouth 2  (two) times daily with a meal., Disp: 60 tablet, Rfl: 0 .  sacubitril-valsartan (ENTRESTO) 49-51 MG, Take 1 tablet by mouth 2 (two) times daily., Disp: 60 tablet, Rfl: 3 .  sertraline (ZOLOFT) 50 MG tablet, Take 1 tablet (50 mg total) by mouth daily., Disp: 30 tablet, Rfl: 3 .  spironolactone (ALDACTONE) 25 MG tablet, Take 1 tablet (25 mg total) by mouth daily., Disp: 30 tablet, Rfl: 1 .  albuterol (VENTOLIN HFA) 108 (90 Base) MCG/ACT inhaler, Inhale 2 puffs into the lungs every 6 (six) hours as needed for wheezing or shortness of breath (cough). Will pick up scripts tomorrow 7-9., Disp: 18 g, Rfl: 1 .  budesonide-formoterol (SYMBICORT) 80-4.5 MCG/ACT inhaler, Inhale 2 puffs into the lungs 2 (two) times daily., Disp: 1 Inhaler, Rfl: 6 .  diclofenac sodium (VOLTAREN) 1 % GEL, Apply 2 g topically 4 (four) times daily., Disp: 100 g, Rfl: 1 .  fluticasone (FLONASE) 50 MCG/ACT nasal spray, Place 2 sprays into both nostrils daily., Disp: 16 g, Rfl: 0 .  folic acid (FOLVITE) 1 MG tablet, Take 1 tablet (1 mg total) by mouth daily., Disp: 30 tablet, Rfl: 2 .  lidocaine (LIDODERM) 5 %, Place 1 patch onto the skin daily. Remove & Discard patch within 12 hours or as directed by MD, Disp: 30 patch, Rfl: 0 .  megestrol (MEGACE) 400 MG/10ML suspension, Take 10 mLs (400 mg total) by mouth 2 (two) times daily., Disp: 240 mL, Rfl: 0 .  metroNIDAZOLE (FLAGYL) 500 MG tablet, Take 1 tablet (500 mg total) by mouth 2 (two) times daily., Disp: 14 tablet, Rfl: 0 .  mouth rinse LIQD solution, 15 mLs by Mouth Rinse route 2 (two) times daily., Disp: 44 mL, Rfl: 0 .  phenol (CHLORASEPTIC) 1.4 % LIQD, Use as directed 1 spray in the mouth or throat as needed for throat irritation / pain., Disp: 20 mL, Rfl: 0 .  thiamine 100 MG tablet, Take 1 tablet (100 mg total) by mouth daily., Disp: 30 tablet, Rfl: 0 .  tiotropium (SPIRIVA HANDIHALER) 18 MCG inhalation capsule, Place 1 capsule (18 mcg total) into inhaler and inhale daily., Disp:  30 capsule, Rfl: 11 Allergies  Allergen Reactions  . Penicillins Other (See Comments)    Pt had slight throat swelling Has patient had a PCN reaction causing immediate rash, facial/tongue/throat swelling, SOB or lightheadedness with hypotension: yes Has patient had a PCN reaction causing severe rash involving mucus membranes or skin necrosis: No Has patient had a PCN reaction that required hospitalization No Has patient had a PCN reaction occurring within the last 10 years: No If all of the above answers are "NO", then may proceed with Cephalosporin use.      Social History   Socioeconomic History  . Marital status: Single    Spouse name: Not on file  . Number of children: 4  . Years of education: Not on file  . Highest education level: Not on file  Occupational History  . Occupation: trying to get disability    Employer: [employer]  Social Needs  . Financial resource strain: Not on file  . Food insecurity    Worry: Not on file    Inability: Not on file  . Transportation needs    Medical: Not on file    Non-medical: Not on file  Tobacco Use  . Smoking status: Former Smoker    Packs/day: 5.00    Years: 30.00    Pack years: 150.00    Types: Cigarettes  . Smokeless tobacco: Never Used  . Tobacco comment: 1 or 2 cigarettes a day- approx 1 pack week  Substance and Sexual Activity  . Alcohol use: Not Currently    Alcohol/week: 12.0 standard drinks    Types: 12 Cans of beer per week    Comment: last drink was 5/19  . Drug use: Yes    Types: Marijuana, Cocaine    Comment: daily marijuana; last use of cocaine 05/02/18 but selling it  . Sexual activity: Not Currently    Birth control/protection: Surgical  Lifestyle  . Physical activity    Days per week: Not on file    Minutes per session: Not on file  . Stress: Not on file  Relationships  . Social Herbalist on phone: Not on file    Gets together: Not on file    Attends religious service: Not on file     Active member of club or organization: Not on file    Attends meetings of clubs or organizations: Not on file    Relationship status: Not on file  . Intimate partner violence    Fear of current or ex partner: Not on file    Emotionally abused: Not on file    Physically abused: Not on file    Forced sexual activity: Not on file  Other Topics Concern  . Not on file  Social History Narrative   ** Merged History Encounter **  Physical Exam Pulmonary:     Effort: No respiratory distress.     Breath sounds: No wheezing or rales.  Abdominal:     General: There is no distension.  Musculoskeletal:     Right lower leg: No edema.     Left lower leg: No edema.  Skin:    General: Skin is warm and dry.         Future Appointments  Date Time Provider [place]  11/29/2018  9:00 AM [facility] PA/NP [facility] None     BP 108/80 (BP Location: Right Arm, Patient Position: Sitting, Cuff Size: Normal)   Pulse 70   Wt 132 lb (59.9 kg)   LMP 02/18/2015 (Exact Date) Comment: MAYBE ONCE A YEAR  SpO2 98%   BMI 21.97 kg/m   Weight yesterday-didn't weigh Last visit weight-130  ATF pt CAO x4 sitting on the porch with no complaints.  She denies sob, chest pain and dizziness.  She wasn't able to pick up her medications from [place].  She's out of several medications today and the rest need refilling after this visit.  She's taken all of her medications for this week.  rx bottles verified and pill box refilled.   Medication ordered: all  [place], EMT Paramedic [phone] 11/23/2018    ACTION: Home visit completed

## 2018-11-29 ENCOUNTER — Ambulatory Visit (HOSPITAL_COMMUNITY)
Admission: RE | Admit: 2018-11-29 | Discharge: 2018-11-29 | Disposition: A | Discharge status: Home / Self Care | Payer: Medicaid Other | Source: Ambulatory Visit | Attending: Adult Health | Admitting: Adult Health

## 2018-11-29 ENCOUNTER — Other Ambulatory Visit (HOSPITAL_COMMUNITY): Payer: Self-pay

## 2018-11-29 ENCOUNTER — Other Ambulatory Visit: Payer: Self-pay

## 2018-11-29 ENCOUNTER — Telehealth (HOSPITAL_COMMUNITY): Payer: Self-pay | Admitting: Pharmacist

## 2018-11-29 VITALS — BP 160/84 | HR 67 | Wt 127.4 lb

## 2018-11-29 DIAGNOSIS — R0789 Other chest pain: Secondary | ICD-10-CM | POA: Diagnosis not present

## 2018-11-29 DIAGNOSIS — Z8673 Personal history of transient ischemic attack (TIA), and cerebral infarction without residual deficits: Secondary | ICD-10-CM | POA: Diagnosis not present

## 2018-11-29 DIAGNOSIS — I5042 Chronic combined systolic (congestive) and diastolic (congestive) heart failure: Secondary | ICD-10-CM | POA: Insufficient documentation

## 2018-11-29 DIAGNOSIS — N182 Chronic kidney disease, stage 2 (mild): Secondary | ICD-10-CM | POA: Insufficient documentation

## 2018-11-29 DIAGNOSIS — I130 Hypertensive heart and chronic kidney disease with heart failure and stage 1 through stage 4 chronic kidney disease, or unspecified chronic kidney disease: Secondary | ICD-10-CM | POA: Insufficient documentation

## 2018-11-29 DIAGNOSIS — Z7901 Long term (current) use of anticoagulants: Secondary | ICD-10-CM | POA: Diagnosis not present

## 2018-11-29 DIAGNOSIS — I469 Cardiac arrest, cause unspecified: Secondary | ICD-10-CM | POA: Diagnosis not present

## 2018-11-29 DIAGNOSIS — Z79899 Other long term (current) drug therapy: Secondary | ICD-10-CM | POA: Diagnosis not present

## 2018-11-29 DIAGNOSIS — J449 Chronic obstructive pulmonary disease, unspecified: Secondary | ICD-10-CM | POA: Insufficient documentation

## 2018-11-29 DIAGNOSIS — Z87891 Personal history of nicotine dependence: Secondary | ICD-10-CM | POA: Diagnosis not present

## 2018-11-29 DIAGNOSIS — E785 Hyperlipidemia, unspecified: Secondary | ICD-10-CM | POA: Insufficient documentation

## 2018-11-29 DIAGNOSIS — I480 Paroxysmal atrial fibrillation: Secondary | ICD-10-CM | POA: Diagnosis not present

## 2018-11-29 LAB — CBC
HCT: 36.3 % (ref 36.0–46.0)
Hemoglobin: 11.7 g/dL — ABNORMAL LOW (ref 12.0–15.0)
MCH: 29.5 pg (ref 26.0–34.0)
MCHC: 32.2 g/dL (ref 30.0–36.0)
MCV: 91.4 fL (ref 80.0–100.0)
Platelets: 298 10*3/uL (ref 150–400)
RBC: 3.97 MIL/uL (ref 3.87–5.11)
RDW: 14.5 % (ref 11.5–15.5)
WBC: 5.3 10*3/uL (ref 4.0–10.5)
nRBC: 0 % (ref 0.0–0.2)

## 2018-11-29 LAB — HEPATIC FUNCTION PANEL
ALT: 24 U/L (ref 0–44)
AST: 34 U/L (ref 15–41)
Albumin: 3.9 g/dL (ref 3.5–5.0)
Alkaline Phosphatase: 65 U/L (ref 38–126)
Bilirubin, Direct: <0.1 mg/dL (ref 0.0–0.2)
Total Bilirubin: 0.4 mg/dL (ref 0.3–1.2)
Total Protein: 6.9 g/dL (ref 6.5–8.1)

## 2018-11-29 LAB — TSH: TSH: 0.447 u[IU]/mL (ref 0.350–4.500)

## 2018-11-29 LAB — BASIC METABOLIC PANEL
Anion gap: 9 (ref 5–15)
BUN: 10 mg/dL (ref 6–20)
CO2: 26 mmol/L (ref 22–32)
Calcium: 9.1 mg/dL (ref 8.9–10.3)
Chloride: 104 mmol/L (ref 98–111)
Creatinine, Ser: 0.82 mg/dL (ref 0.44–1.00)
GFR calc Af Amer: >60 mL/min (ref >60)
GFR calc non Af Amer: >60 mL/min (ref >60)
Glucose, Bld: 101 mg/dL — ABNORMAL HIGH (ref 70–99)
Potassium: 4.2 mmol/L (ref 3.5–5.1)
Sodium: 139 mmol/L (ref 135–145)

## 2018-11-29 LAB — T4, FREE: Free T4: 0.88 ng/dL (ref 0.61–1.12)

## 2018-11-29 LAB — DIGOXIN LEVEL: Digoxin Level: 1.1 ng/mL (ref 0.8–2.0)

## 2018-11-29 MED ORDER — ENTRESTO 97-103 MG PO TABS: 1.0000 | ORAL_TABLET | Freq: Two times a day (BID) | ORAL | 11 refills | Status: DC

## 2018-11-29 MED FILL — AMIODARONE HCL 200 MG TAB: 200 | 30 days supply | Qty: 30 | Fill #0

## 2018-11-29 MED FILL — ?SERTRALINE HCL 25MG TABS: 25 | 30 days supply | Qty: 60 | Fill #2

## 2018-11-29 MED FILL — FERROUS SULFATE 325 MG TAB: 325 (65 FE) | 30 days supply | Qty: 60 | Fill #0

## 2018-11-29 MED FILL — ?DIGITEK 125 MCG TABLET: 125 | 30 days supply | Qty: 30 | Fill #0

## 2018-11-29 MED FILL — ?SPIRONOLACTONE 25 MG TABLE: 25 | 30 days supply | Qty: 30 | Fill #0

## 2018-11-29 MED FILL — ?CARVEDILOL 3.125 MG TABLET: 3.125 | 30 days supply | Qty: 60 | Fill #1

## 2018-11-29 NOTE — Telephone Encounter (Signed)
Tonette Bihari prescription sent to [facility] [employer] pt assistance).

## 2018-11-29 NOTE — Progress Notes (Signed)
Paramedicine Encounter   Patient ID: Sabrina Holland , female,   DOB: [birth year],48 y.o.,  MRN: [id number]   Met patient in clinic today with provider [place].   Mrs. Vaccaro denies sob, chest pain and dizziness.  She's taken her medications before coming today.  I picked up several prescriptions from [place].  Reports drinking alcohol a few times a month; she's trying to stop smoking. Next visit is with Dr. Missy Sabins.  Med changes: Increase entresto 97-103  Time spent with patient 35 mins   [place], [place] 11/29/2018   ACTION: Home visit completed

## 2018-11-29 NOTE — Patient Instructions (Signed)
INCREASE Entresto to 97/103 mg, one tab twice daily  Labs today We will only contact you if something comes back abnormal or we need to make some changes. Otherwise no news is good news!  Labs needed in 7-10 days  Your physician recommends that you schedule a follow-up appointment in: 6 weeks with Dr Haroldine Laws  Do the following things EVERYDAY: 1) Weigh yourself in the morning before breakfast. Write it down and keep it in a log. 2) Take your medicines as prescribed 3) Eat low salt foods-Limit salt (sodium) to 2000 mg per day.  4) Stay as active as you can everyday 5) Limit all fluids for the day to less than 2 liters  At [facility], you and your health needs are our priority. As part of our continuing mission to provide you with exceptional heart care, we have created designated Provider Care Teams. These Care Teams include your primary Cardiologist (physician) and Advanced Practice Providers (APPs- Physician Assistants and Nurse Practitioners) who all work together to provide you with the care you need, when you need it.   You may see any of the following providers on your designated Care Team at your next follow up: Marland Kitchen Dr Glori Bickers . Dr Loralie Champagne . Darrick Grinder, NP . Lyda Jester, PA   Please be sure to bring in all your medications bottles to every appointment.

## 2018-11-29 NOTE — Progress Notes (Signed)
PCP: Sabrina Rankins NP Cardiologist: Dr Sabrina Holland  Primary HF Cardiologist: Dr Sabrina Holland   HPI: Sabrina Holland a 48 y.o.femalewith a hx of ongoing polysubstance abuse (h/o cocaine, opiates, THC, tobacco abuse,priorhabitual ETOH), poor social situation,chronic combined CHF EF 25-30% (no prior ischemic w/u), HTN, mitral regurgitation, HLD,CKD II by labs,breast CA (s/p L mastectomy, chemo, tamoxifen), TIA, COPD.  She had a normal echocardiogram in 2013 as part of a work-up for TIA. In 06/2017  admitted with SOB and initially had left AMA. However, she returned and was found to have severe CHF with EF 25-30%, grade 3 DD. She was diuresed and started on guideline directed therapy. She has been following with Dr. Recardo Holland but therapy limited by her social situation and recurrent cocaine use. Last echo 11/2017 showed EF 25-30%, mild MR, mild LAE, PASP 101mmHg. She was admitted in 02/2018 for recurrent HF. She was not taking Lasix as prescribed due to frequent urination at work. She refused coronary CT or cath at that time She has subsequently followed up in telehealth with adjustment in meds. Dr. Sallyanne Holland has suggested avoidance of beta blocker given continued cocaine use.   Presented to [facility] 07/03/2018 with increased SOB and chest tightness. +  For cocaine on admit. VF arrest on 6/19 with defibrillation x1 and intubation.ECHO showed severely reduced EF at 15%.  Treated for PNA. Had [place] wit normal cors. Discharged to home on 07/12/2018.   Since last hospital discharge,  her HF regimen has been gradually titrated up. She presents back to clinic today for f/u and further med titration. Here w/ paramedic, Sabrina Holland. She admits that she ran out of several of her meds this week, but was able to get refills from [facility] this morning. Subsequently, her BP this morning is high at 160/84. HR 67 pbm. Sabrina Holland reports that her BP is good [id number] systolic when she is taking all of hear meds. No hypotension.There has been no  significant wt gain at home. Wt up 2 lb from last OV. She has chronic NYHA class III symptoms. No symptoms at rest. No LEE. Denies CP. No palpitations, syncope/ near syncope. Still smokes ~2-3 cigs a day. Denies any cocaine use.    Echo 07/05/18 EF 15% RV moderately to severely down  LHC 07/10/18  normal cors.   Repeat 2D Echo 10/18/18  Left ventricular ejection fraction, by visual estimation, is 20 to 25%. The left ventricle has severely decreased function. Mildly increased left ventricular size. There is no left ventricular hypertrophy. 2. Left ventricular diastolic Doppler parameters are consistent with impaired relaxation pattern of LV diastolic filling. 3. Global right ventricle has mildly reduced systolic function.The right ventricular size is normal. No increase in right ventricular wall thickness. 4. Left atrial size was normal. 5. Right atrial size was normal. 6. The mitral valve is normal in structure. No evidence of mitral valve regurgitation. 7. The tricuspid valve is normal in structure. Tricuspid valve regurgitation is mild. 8. The aortic valve is normal in structure. Aortic valve regurgitation was not visualized by color flow Doppler. 9. The pulmonic valve was normal in structure. Pulmonic valve regurgitation is trivial by color flow Doppler. 10. Mildly elevated pulmonary artery systolic pressure. 11. The inferior vena cava is normal in size with greater   ROS: All systems negative except as listed in HPI, PMH and Problem List.  SH:  Social History   Socioeconomic History  . Marital status: Single    Spouse name: Not on file  . Number of children: 4  .  Years of education: Not on file  . Highest education level: Not on file  Occupational History  . Occupation: trying to get disability    Employer: [employer]  Social Needs  . Financial resource strain: Not on file  . Food insecurity    Worry: Not on file    Inability: Not on file  . Transportation needs     Medical: Not on file    Non-medical: Not on file  Tobacco Use  . Smoking status: Former Smoker    Packs/day: 5.00    Years: 30.00    Pack years: 150.00    Types: Cigarettes  . Smokeless tobacco: Never Used  . Tobacco comment: 1 or 2 cigarettes a day- approx 1 pack week  Substance and Sexual Activity  . Alcohol use: Not Currently    Alcohol/week: 12.0 standard drinks    Types: 12 Cans of beer per week    Comment: last drink was 5/19  . Drug use: Yes    Types: Marijuana, Cocaine    Comment: daily marijuana; last use of cocaine 05/02/18 but selling it  . Sexual activity: Not Currently    Birth control/protection: Surgical  Lifestyle  . Physical activity    Days per week: Not on file    Minutes per session: Not on file  . Stress: Not on file  Relationships  . Social Herbalist on phone: Not on file    Gets together: Not on file    Attends religious service: Not on file    Active member of club or organization: Not on file    Attends meetings of clubs or organizations: Not on file    Relationship status: Not on file  . Intimate partner violence    Fear of current or ex partner: Not on file    Emotionally abused: Not on file    Physically abused: Not on file    Forced sexual activity: Not on file  Other Topics Concern  . Not on file  Social History Narrative   ** Merged History Encounter **        FH:  Family History  Problem Relation Age of Onset  . COPD Father   . Sickle cell anemia Cousin        maternal cousin    Past Medical History:  Diagnosis Date  . Atrial flutter ([place]) 07/03/2018  . Breast cancer, stage 2 ([place]) 02/23/2011  . Chronic combined systolic and diastolic CHF (congestive heart failure) ([place])   . CKD (chronic kidney disease), stage II   . Cocaine use   . COPD (chronic obstructive pulmonary disease) ([place])   . Homelessness   . Hypertension   . Polysubstance abuse ([place])   . Poor social situation   . TIA (transient ischemic attack)  11/25/2011   "first time" (11/25/2011)    Current Outpatient Medications  Medication Sig Dispense Refill  . albuterol (VENTOLIN HFA) 108 (90 Base) MCG/ACT inhaler Inhale 2 puffs into the lungs every 6 (six) hours as needed for wheezing or shortness of breath (cough). Will pick up scripts tomorrow 7-9. 18 g 1  . amiodarone (PACERONE) 200 MG tablet Take 1 tablet (200 mg total) by mouth daily. 60 tablet 3  . apixaban (ELIQUIS) 5 MG TABS tablet Take 1 tablet (5 mg total) by mouth 2 (two) times daily. Patient will pick up scripts today. 180 tablet 3  . carvedilol (COREG) 3.125 MG tablet Take 1 tablet (3.125 mg total) by mouth 2 (two) times  daily. 180 tablet 3  . digoxin (LANOXIN) 0.125 MG tablet Take 1 tablet (0.125 mg total) by mouth daily. 30 tablet 3  . ferrous sulfate 325 (65 FE) MG tablet Take 1 tablet (325 mg total) by mouth 2 (two) times daily with a meal. 60 tablet 0  . fluticasone (FLONASE) 50 MCG/ACT nasal spray Place 2 sprays into both nostrils daily. 16 g 0  . folic acid (FOLVITE) 1 MG tablet Take 1 tablet (1 mg total) by mouth daily. 30 tablet 2  . sertraline (ZOLOFT) 50 MG tablet Take 1 tablet (50 mg total) by mouth daily. 30 tablet 3  . spironolactone (ALDACTONE) 25 MG tablet Take 1 tablet (25 mg total) by mouth daily. 30 tablet 1  . thiamine 100 MG tablet Take 1 tablet (100 mg total) by mouth daily. 30 tablet 0  . sacubitril-valsartan (ENTRESTO) 97-103 MG Take 1 tablet by mouth 2 (two) times daily. 60 tablet 11   No current facility-administered medications for this encounter.     Vitals:   11/29/18 0902  BP: (!) 160/84  Pulse: 67  SpO2: 97%  Weight: 57.8 kg (127 lb 6.4 oz)   Wt Readings from Last 3 Encounters:  11/29/18 57.8 kg (127 lb 6.4 oz)  11/23/18 59.9 kg (132 lb)  11/09/18 59 kg (130 lb)    PHYSICAL EXAM: PHYSICAL EXAM: General:  Well appearing, thin AAF. No respiratory difficulty HEENT: normal Neck: supple. no JVD. Carotids 2+ bilat; no bruits. No  lymphadenopathy or thyromegaly appreciated. Cor: PMI nondisplaced. Regular rate & rhythm. No rubs, gallops or murmurs. Lungs: clear Abdomen: soft, nontender, nondistended. No hepatosplenomegaly. No bruits or masses. Good bowel sounds. Extremities: no cyanosis, clubbing, rash, edema Neuro: alert & oriented x 3, cranial nerves grossly intact. moves all 4 extremities w/o difficulty. Affect pleasant.    ASSESSMENT & PLAN: 1. Chronic systolic HF  -Echo 0000000 w/ low EF 15% RV moderately to severely down. Suspect due to polysubstance abuse +/- tachy-induced. Cath 07/10/18 w/ normal cors. Repeat echo 10/20 showed EF 20-25%. RV systolic function mildly reduced.   - NYHA Class III. Euvolemic on exam.   - Continue carvedilol 3.125 mg twice a day. Will not titrate w/ HR in the 60s. - Increase Entresto to 97-103 mg twice a day.  - Continue spiro 25 mg daily.  - Continue dig 0.125 mg.  - Check BMET today and again in 7 days. - Check digoxin level  - Not candidate for home inotropes or advanced therapies due to ongoing drug use - With repeat echo showing EF <35%, will need to consider ICD but ? If candidate given drug history (denies any repeat cocaine use). Will discuss w/ Dr. Haroldine Holland. With further titration of Entresto to maximum dose, consider repeating echo in another 3 months.   2. Cardiac arrest VFF/TdP  - s/p Code Blue 6/19 with defib x 1 - cath w/ normal cors, EF 20-25% - denies palpitations, syncope/ near syncope  - May ultimately need ICD. See above.    3. PAF - Regular on exam. HR controlled in the 60s.  - Continue amio 200 mg daily.  - Check TFTs and HFTs today  - Needs annual eye exam (just completed an exam last week) - Continue eliquis 5 mg twice a day. Denies abnormal bleeding.  - Check CBC today  4. Polysubstance abuse/cocaine abuse - longstanding history but she denies any recurrent cocaine use since June.   -SW following and has previously referred to outpatient  programs. She  does not want inpatient treatment.   5. Tobacco Abuse.  -Continues to smoke 2-3 cigs/day.  -Encouraged complete smoking cessation. Currently trying nicotine patch.  F/u BMP in 7 days. Follow-up w/ Dr. Haroldine Holland in 6 weeks.  Continue HF Paramedicine.    Kynlei Piontek PA-C  9:41 AM

## 2018-11-30 ENCOUNTER — Encounter (HOSPITAL_COMMUNITY): Payer: Self-pay

## 2018-11-30 LAB — T3, FREE: T3, Free: 2.4 pg/mL (ref 2.0–4.4)

## 2018-12-02 DIAGNOSIS — H5213 Myopia, bilateral: Secondary | ICD-10-CM | POA: Diagnosis not present

## 2018-12-05 ENCOUNTER — Telehealth (HOSPITAL_COMMUNITY): Payer: Self-pay | Admitting: Licensed Clinical Social Worker

## 2018-12-05 NOTE — Telephone Encounter (Signed)
CSW received call from pt who is wondering what happened to her [place] shipment through [employer].  States she ordered it several days ago and was told it was to arrive yesterday.  CSW gave pt number to inquire but she stated she couldn't get through to a representative so CSW called and spoke to someone.  Rep stated that they attempted to deliver yesterday and today unsuccessfully so it is going to a [employer] access pt.  CSW provided access pt info to pt to go pick up- informed pt of missed delivery attempts and told her to call [employer] if her address had changed.  Pt also inquired about disability.  States that she was told they were waiting on an MD but wasn't sure if that meant they still needed additional clinicals or that it was with the DDS MD for review.  CSW called and spoke with pt case worker regarding this- she thinks they have everything they need but will review and call CSW if they need additional documentation.  CSW will continue to follow and assist as needed.  [place], LCSW Clinical Social Worker [facility] Desk#: [phone] Cell#: [phone]

## 2018-12-06 ENCOUNTER — Other Ambulatory Visit (HOSPITAL_COMMUNITY): Payer: Medicaid Other

## 2018-12-07 ENCOUNTER — Other Ambulatory Visit (HOSPITAL_COMMUNITY): Payer: Self-pay

## 2018-12-07 NOTE — Progress Notes (Signed)
Paramedicine Encounter    Patient ID: Sabrina Holland, female    DOB: 1970/06/18, 48 y.o.   MRN: [id number]    Patient Care Team: Gildardo Pounds, NP as PCP - General (Nurse Practitioner) Sanda Klein, MD as PCP - Cardiology (Cardiology)  Patient Active Problem List   Diagnosis Date Noted  . Palliative care by specialist   . Goals of care, counseling/discussion   . Acute respiratory failure ([place])   . Endotracheally intubated   . Torsades de pointes ([place])   . Cardiac arrest ([place])   . Atrial flutter ([place]) 07/03/2018  . Elevated LFTs 04/18/2018  . Malnutrition of moderate degree 02/27/2018  . Dyspnea 02/26/2018  . Acute on chronic systolic and diastolic heart failure, NYHA class 3 ([place]) 01/20/2018  . Cocaine abuse ([place]) 12/19/2017  . Mixed hyperlipidemia 12/19/2017  . COPD exacerbation ([place])   . Acute on chronic respiratory failure with hypoxemia ([place]) 12/03/2017  . Cardiomyopathy ([place]) 07/04/2017  . COPD (chronic obstructive pulmonary disease) ([place])   . Chronic combined systolic and diastolic heart failure ([place])   . Anemia 06/20/2017  . Polysubstance abuse ([place]) 11/26/2011  . TIA (transient ischemic attack) 11/25/2011  . Essential hypertension 11/25/2011  . Sinusitis 11/25/2011  . Vitamin B 12 deficiency 07/18/2011  . History of breast cancer 02/23/2011    Current Outpatient Medications:  .  amiodarone (PACERONE) 200 MG tablet, Take 1 tablet (200 mg total) by mouth daily., Disp: 60 tablet, Rfl: 3 .  apixaban (ELIQUIS) 5 MG TABS tablet, Take 1 tablet (5 mg total) by mouth 2 (two) times daily. Patient will pick up scripts today., Disp: 180 tablet, Rfl: 3 .  carvedilol (COREG) 3.125 MG tablet, Take 1 tablet (3.125 mg total) by mouth 2 (two) times daily., Disp: 180 tablet, Rfl: 3 .  digoxin (LANOXIN) 0.125 MG tablet, Take 1 tablet (0.125 mg total) by mouth daily., Disp: 30 tablet, Rfl: 3 .  ferrous sulfate 325 (65 FE) MG tablet, Take 1 tablet (325 mg total) by mouth 2  (two) times daily with a meal., Disp: 60 tablet, Rfl: 0 .  fluticasone (FLONASE) 50 MCG/ACT nasal spray, Place 2 sprays into both nostrils daily., Disp: 16 g, Rfl: 0 .  sertraline (ZOLOFT) 50 MG tablet, Take 1 tablet (50 mg total) by mouth daily., Disp: 30 tablet, Rfl: 3 .  albuterol (VENTOLIN HFA) 108 (90 Base) MCG/ACT inhaler, Inhale 2 puffs into the lungs every 6 (six) hours as needed for wheezing or shortness of breath (cough). Will pick up scripts tomorrow 7-9., Disp: 18 g, Rfl: 1 .  folic acid (FOLVITE) 1 MG tablet, Take 1 tablet (1 mg total) by mouth daily., Disp: 30 tablet, Rfl: 2 .  sacubitril-valsartan (ENTRESTO) 97-103 MG, Take 1 tablet by mouth 2 (two) times daily., Disp: 60 tablet, Rfl: 11 .  spironolactone (ALDACTONE) 25 MG tablet, Take 1 tablet (25 mg total) by mouth daily., Disp: 30 tablet, Rfl: 1 .  thiamine 100 MG tablet, Take 1 tablet (100 mg total) by mouth daily., Disp: 30 tablet, Rfl: 0 Allergies  Allergen Reactions  . Penicillins Other (See Comments)    Pt had slight throat swelling Has patient had a PCN reaction causing immediate rash, facial/tongue/throat swelling, SOB or lightheadedness with hypotension: yes Has patient had a PCN reaction causing severe rash involving mucus membranes or skin necrosis: No Has patient had a PCN reaction that required hospitalization No Has patient had a PCN reaction occurring within the last 10 years: No If all of  the above answers are "NO", then may proceed with Cephalosporin use.      Social History   Socioeconomic History  . Marital status: Single    Spouse name: Not on file  . Number of children: 4  . Years of education: Not on file  . Highest education level: Not on file  Occupational History  . Occupation: trying to get disability    Employer: [employer]  Social Needs  . Financial resource strain: Not on file  . Food insecurity    Worry: Not on file    Inability: Not on file  . Transportation needs    Medical: Not  on file    Non-medical: Not on file  Tobacco Use  . Smoking status: Former Smoker    Packs/day: 5.00    Years: 30.00    Pack years: 150.00    Types: Cigarettes  . Smokeless tobacco: Never Used  . Tobacco comment: 1 or 2 cigarettes a day- approx 1 pack week  Substance and Sexual Activity  . Alcohol use: Not Currently    Alcohol/week: 12.0 standard drinks    Types: 12 Cans of beer per week    Comment: last drink was 5/19  . Drug use: Yes    Types: Marijuana, Cocaine    Comment: daily marijuana; last use of cocaine 05/02/18 but selling it  . Sexual activity: Not Currently    Birth control/protection: Surgical  Lifestyle  . Physical activity    Days per week: Not on file    Minutes per session: Not on file  . Stress: Not on file  Relationships  . Social Herbalist on phone: Not on file    Gets together: Not on file    Attends religious service: Not on file    Active member of club or organization: Not on file    Attends meetings of clubs or organizations: Not on file    Relationship status: Not on file  . Intimate partner violence    Fear of current or ex partner: Not on file    Emotionally abused: Not on file    Physically abused: Not on file    Forced sexual activity: Not on file  Other Topics Concern  . Not on file  Social History Narrative   ** Merged History Encounter **        Physical Exam Pulmonary:     Effort: No respiratory distress.     Breath sounds: No wheezing or rales.  Abdominal:     General: Abdomen is flat.     Palpations: Abdomen is soft.  Musculoskeletal:        General: No swelling.     Right lower leg: No edema.     Left lower leg: No edema.  Skin:    General: Skin is warm and dry.         Future Appointments  Date Time Provider [place]  12/11/2018 10:15 AM [facility] None  01/10/2019 11:40 AM Bensimhon, Shaune Pascal, MD [facility] None     BP 136/86 (BP Location: Right Arm, Patient Position: Sitting, Cuff  Size: Normal)   Pulse (!) 101   Temp (!) 97.3 F (36.3 C)   LMP 02/18/2015 (Exact Date) Comment: MAYBE ONCE A YEAR  SpO2 97%   Weight yesterday-didn't weigh Last visit weight-127  ATF pt CAO x4 sitting in the living room, which no complaints.  She said that she has an appointment next week to get an consultation to  have her teeth removed.  She doesn't have any dental pain right now. She denies sob, chest pain and dizziness.  She's taken all of her meds this week.  Entresto hasn't been delivered yet.  She will be out about tomorrow.  Eliezer Lofts called to check on them yesterday.  rx bottles verified and pill box refilled.      Medication ordered: entresto (waiting for delivery)  [place], EMT Paramedic [phone] 12/07/2018    ACTION: Home visit completed

## 2018-12-10 ENCOUNTER — Telehealth (HOSPITAL_COMMUNITY): Payer: Self-pay | Admitting: Licensed Clinical Social Worker

## 2018-12-10 NOTE — Telephone Encounter (Signed)
CSW received call from pt who is at her dental office.  Patient needs to know current medication list as she is trying to schedule a surgery with her dentist office.  CSW provided current medication information to dental office.  CSW will continue to follow and assist as needed  Jorge Ny, [facility] Desk#: [phone] Cell#: [phone]

## 2018-12-11 ENCOUNTER — Other Ambulatory Visit (HOSPITAL_COMMUNITY): Payer: Medicaid Other

## 2018-12-21 ENCOUNTER — Other Ambulatory Visit (HOSPITAL_COMMUNITY): Payer: Self-pay

## 2018-12-21 NOTE — Progress Notes (Signed)
Paramedicine Encounter    Patient ID: Sabrina Holland, female    DOB: Mar 22, 1970, 48 y.o.   MRN: [id number]    Patient Care Team: Gildardo Pounds, NP as PCP - General (Nurse Practitioner) Sanda Klein, MD as PCP - Cardiology (Cardiology)  Patient Active Problem List   Diagnosis Date Noted  . Palliative care by specialist   . Goals of care, counseling/discussion   . Acute respiratory failure ([place])   . Endotracheally intubated   . Torsades de pointes ([place])   . Cardiac arrest ([place])   . Atrial flutter ([place]) 07/03/2018  . Elevated LFTs 04/18/2018  . Malnutrition of moderate degree 02/27/2018  . Dyspnea 02/26/2018  . Acute on chronic systolic and diastolic heart failure, NYHA class 3 ([place]) 01/20/2018  . Cocaine abuse ([place]) 12/19/2017  . Mixed hyperlipidemia 12/19/2017  . COPD exacerbation ([place])   . Acute on chronic respiratory failure with hypoxemia ([place]) 12/03/2017  . Cardiomyopathy ([place]) 07/04/2017  . COPD (chronic obstructive pulmonary disease) ([place])   . Chronic combined systolic and diastolic heart failure ([place])   . Anemia 06/20/2017  . Polysubstance abuse ([place]) 11/26/2011  . TIA (transient ischemic attack) 11/25/2011  . Essential hypertension 11/25/2011  . Sinusitis 11/25/2011  . Vitamin B 12 deficiency 07/18/2011  . History of breast cancer 02/23/2011    Current Outpatient Medications:  .  albuterol (VENTOLIN HFA) 108 (90 Base) MCG/ACT inhaler, Inhale 2 puffs into the lungs every 6 (six) hours as needed for wheezing or shortness of breath (cough). Will pick up scripts tomorrow 7-9., Disp: 18 g, Rfl: 1 .  amiodarone (PACERONE) 200 MG tablet, Take 1 tablet (200 mg total) by mouth daily., Disp: 60 tablet, Rfl: 3 .  apixaban (ELIQUIS) 5 MG TABS tablet, Take 1 tablet (5 mg total) by mouth 2 (two) times daily. Patient will pick up scripts today., Disp: 180 tablet, Rfl: 3 .  carvedilol (COREG) 3.125 MG tablet, Take 1 tablet (3.125 mg total) by mouth 2 (two) times  daily., Disp: 180 tablet, Rfl: 3 .  digoxin (LANOXIN) 0.125 MG tablet, Take 1 tablet (0.125 mg total) by mouth daily., Disp: 30 tablet, Rfl: 3 .  ferrous sulfate 325 (65 FE) MG tablet, Take 1 tablet (325 mg total) by mouth 2 (two) times daily with a meal., Disp: 60 tablet, Rfl: 0 .  fluticasone (FLONASE) 50 MCG/ACT nasal spray, Place 2 sprays into both nostrils daily., Disp: 16 g, Rfl: 0 .  folic acid (FOLVITE) 1 MG tablet, Take 1 tablet (1 mg total) by mouth daily., Disp: 30 tablet, Rfl: 2 .  sacubitril-valsartan (ENTRESTO) 97-103 MG, Take 1 tablet by mouth 2 (two) times daily., Disp: 60 tablet, Rfl: 11 .  sertraline (ZOLOFT) 50 MG tablet, Take 1 tablet (50 mg total) by mouth daily., Disp: 30 tablet, Rfl: 3 .  spironolactone (ALDACTONE) 25 MG tablet, Take 1 tablet (25 mg total) by mouth daily., Disp: 30 tablet, Rfl: 1 .  thiamine 100 MG tablet, Take 1 tablet (100 mg total) by mouth daily., Disp: 30 tablet, Rfl: 0 Allergies  Allergen Reactions  . Penicillins Other (See Comments)    Pt had slight throat swelling Has patient had a PCN reaction causing immediate rash, facial/tongue/throat swelling, SOB or lightheadedness with hypotension: yes Has patient had a PCN reaction causing severe rash involving mucus membranes or skin necrosis: No Has patient had a PCN reaction that required hospitalization No Has patient had a PCN reaction occurring within the last 10 years: No If all of  the above answers are "NO", then may proceed with Cephalosporin use.      Social History   Socioeconomic History  . Marital status: Single    Spouse name: Not on file  . Number of children: 4  . Years of education: Not on file  . Highest education level: Not on file  Occupational History  . Occupation: trying to get disability    Employer: [employer]  Social Needs  . Financial resource strain: Not on file  . Food insecurity    Worry: Not on file    Inability: Not on file  . Transportation needs     Medical: Not on file    Non-medical: Not on file  Tobacco Use  . Smoking status: Former Smoker    Packs/day: 5.00    Years: 30.00    Pack years: 150.00    Types: Cigarettes  . Smokeless tobacco: Never Used  . Tobacco comment: 1 or 2 cigarettes a day- approx 1 pack week  Substance and Sexual Activity  . Alcohol use: Not Currently    Alcohol/week: 12.0 standard drinks    Types: 12 Cans of beer per week    Comment: last drink was 5/19  . Drug use: Yes    Types: Marijuana, Cocaine    Comment: daily marijuana; last use of cocaine 05/02/18 but selling it  . Sexual activity: Not Currently    Birth control/protection: Surgical  Lifestyle  . Physical activity    Days per week: Not on file    Minutes per session: Not on file  . Stress: Not on file  Relationships  . Social Herbalist on phone: Not on file    Gets together: Not on file    Attends religious service: Not on file    Active member of club or organization: Not on file    Attends meetings of clubs or organizations: Not on file    Relationship status: Not on file  . Intimate partner violence    Fear of current or ex partner: Not on file    Emotionally abused: Not on file    Physically abused: Not on file    Forced sexual activity: Not on file  Other Topics Concern  . Not on file  Social History Narrative   ** Merged History Encounter **        Physical Exam Pulmonary:     Effort: Pulmonary effort is normal. No respiratory distress.     Breath sounds: No wheezing.  Musculoskeletal:        General: No swelling.     Right lower leg: No edema.     Left lower leg: No edema.  Skin:    General: Skin is warm and dry.         Future Appointments  Date Time Provider [place]  01/10/2019 11:40 AM Bensimhon, Shaune Pascal, MD [facility] None     BP 116/80 (BP Location: Right Arm, Patient Position: Sitting, Cuff Size: Normal)   Pulse 81   Temp 98.8 F (37.1 C)   Wt 127 lb (57.6 kg)   LMP 02/18/2015  (Exact Date) Comment: MAYBE ONCE A YEAR  BMI 21.13 kg/m   Weight yesterday-127 Last visit weight-127 (clinic)  ATF pt CAO x4 sitting in the living room watching tv.  She said that she went to her apartment and her daughter started arguing with her again. She said that she's going to stay at her boyfriends house for a few more days. She  still hasn't received any mail from housing authority or disability.  She still hasn't gotten the entresto delivery.  Today is the last dose that she has.  She denies sob, chest pain and dizziness.  rx bottles verified and pill box refilled.    Medication ordered: None  Rosemarie Galvis, EMT Paramedic [phone] 12/21/2018    ACTION: Home visit completed

## 2018-12-28 ENCOUNTER — Other Ambulatory Visit (HOSPITAL_COMMUNITY): Payer: Self-pay

## 2018-12-28 NOTE — Progress Notes (Signed)
Paramedicine Encounter    Patient ID: Sabrina Holland, female    DOB: 16-Aug-1970, 48 y.o.   MRN: [id number]    Patient Care Team: Gildardo Pounds, NP as PCP - General (Nurse Practitioner) Sanda Klein, MD as PCP - Cardiology (Cardiology)  Patient Active Problem List   Diagnosis Date Noted  . Palliative care by specialist   . Goals of care, counseling/discussion   . Acute respiratory failure ([place])   . Endotracheally intubated   . Torsades de pointes ([place])   . Cardiac arrest ([place])   . Atrial flutter ([place]) 07/03/2018  . Elevated LFTs 04/18/2018  . Malnutrition of moderate degree 02/27/2018  . Dyspnea 02/26/2018  . Acute on chronic systolic and diastolic heart failure, NYHA class 3 ([place]) 01/20/2018  . Cocaine abuse ([place]) 12/19/2017  . Mixed hyperlipidemia 12/19/2017  . COPD exacerbation ([place])   . Acute on chronic respiratory failure with hypoxemia ([place]) 12/03/2017  . Cardiomyopathy ([place]) 07/04/2017  . COPD (chronic obstructive pulmonary disease) ([place])   . Chronic combined systolic and diastolic heart failure ([place])   . Anemia 06/20/2017  . Polysubstance abuse ([place]) 11/26/2011  . TIA (transient ischemic attack) 11/25/2011  . Essential hypertension 11/25/2011  . Sinusitis 11/25/2011  . Vitamin B 12 deficiency 07/18/2011  . History of breast cancer 02/23/2011    Current Outpatient Medications:  .  albuterol (VENTOLIN HFA) 108 (90 Base) MCG/ACT inhaler, Inhale 2 puffs into the lungs every 6 (six) hours as needed for wheezing or shortness of breath (cough). Will pick up scripts tomorrow 7-9., Disp: 18 g, Rfl: 1 .  amiodarone (PACERONE) 200 MG tablet, Take 1 tablet (200 mg total) by mouth daily., Disp: 60 tablet, Rfl: 3 .  apixaban (ELIQUIS) 5 MG TABS tablet, Take 1 tablet (5 mg total) by mouth 2 (two) times daily. Patient will pick up scripts today., Disp: 180 tablet, Rfl: 3 .  carvedilol (COREG) 3.125 MG tablet, Take 1 tablet (3.125 mg total) by mouth 2 (two) times  daily., Disp: 180 tablet, Rfl: 3 .  digoxin (LANOXIN) 0.125 MG tablet, Take 1 tablet (0.125 mg total) by mouth daily., Disp: 30 tablet, Rfl: 3 .  ferrous sulfate 325 (65 FE) MG tablet, Take 1 tablet (325 mg total) by mouth 2 (two) times daily with a meal., Disp: 60 tablet, Rfl: 0 .  fluticasone (FLONASE) 50 MCG/ACT nasal spray, Place 2 sprays into both nostrils daily., Disp: 16 g, Rfl: 0 .  folic acid (FOLVITE) 1 MG tablet, Take 1 tablet (1 mg total) by mouth daily., Disp: 30 tablet, Rfl: 2 .  sacubitril-valsartan (ENTRESTO) 97-103 MG, Take 1 tablet by mouth 2 (two) times daily., Disp: 60 tablet, Rfl: 11 .  sertraline (ZOLOFT) 50 MG tablet, Take 1 tablet (50 mg total) by mouth daily., Disp: 30 tablet, Rfl: 3 .  spironolactone (ALDACTONE) 25 MG tablet, Take 1 tablet (25 mg total) by mouth daily., Disp: 30 tablet, Rfl: 1 .  thiamine 100 MG tablet, Take 1 tablet (100 mg total) by mouth daily., Disp: 30 tablet, Rfl: 0 Allergies  Allergen Reactions  . Penicillins Other (See Comments)    Pt had slight throat swelling Has patient had a PCN reaction causing immediate rash, facial/tongue/throat swelling, SOB or lightheadedness with hypotension: yes Has patient had a PCN reaction causing severe rash involving mucus membranes or skin necrosis: No Has patient had a PCN reaction that required hospitalization No Has patient had a PCN reaction occurring within the last 10 years: No If all of  the above answers are "NO", then may proceed with Cephalosporin use.      Social History   Socioeconomic History  . Marital status: Single    Spouse name: Not on file  . Number of children: 4  . Years of education: Not on file  . Highest education level: Not on file  Occupational History  . Occupation: trying to get disability    Employer: [employer]  Tobacco Use  . Smoking status: Former Smoker    Packs/day: 5.00    Years: 30.00    Pack years: 150.00    Types: Cigarettes  . Smokeless tobacco: Never Used   . Tobacco comment: 1 or 2 cigarettes a day- approx 1 pack week  Substance and Sexual Activity  . Alcohol use: Not Currently    Alcohol/week: 12.0 standard drinks    Types: 12 Cans of beer per week    Comment: last drink was 5/19  . Drug use: Yes    Types: Marijuana, Cocaine    Comment: daily marijuana; last use of cocaine 05/02/18 but selling it  . Sexual activity: Not Currently    Birth control/protection: Surgical  Other Topics Concern  . Not on file  Social History Narrative   ** Merged History Encounter **       Social Determinants of Health   Financial Resource Strain:   . Difficulty of Paying Living Expenses: Not on file  Food Insecurity:   . Worried About Charity fundraiser in the Last Year: Not on file  . Ran Out of Food in the Last Year: Not on file  Transportation Needs:   . Lack of Transportation (Medical): Not on file  . Lack of Transportation (Non-Medical): Not on file  Physical Activity:   . Days of Exercise per Week: Not on file  . Minutes of Exercise per Session: Not on file  Stress:   . Feeling of Stress : Not on file  Social Connections:   . Frequency of Communication with Friends and Family: Not on file  . Frequency of Social Gatherings with Friends and Family: Not on file  . Attends Religious Services: Not on file  . Active Member of Clubs or Organizations: Not on file  . Attends Archivist Meetings: Not on file  . Marital Status: Not on file  Intimate Partner Violence:   . Fear of Current or Ex-Partner: Not on file  . Emotionally Abused: Not on file  . Physically Abused: Not on file  . Sexually Abused: Not on file    Physical Exam Pulmonary:     Effort: No respiratory distress.     Breath sounds: No wheezing.  Abdominal:     General: There is no distension.  Musculoskeletal:        General: No swelling.     Right lower leg: No edema.     Left lower leg: No edema.  Skin:    General: Skin is warm and dry.         Future  Appointments  Date Time Provider [place]  01/10/2019 11:40 AM Bensimhon, Shaune Pascal, MD [facility] None     BP (!) 154/90 (BP Location: Right Arm, Patient Position: Sitting, Cuff Size: Normal)   Pulse 88   LMP 02/18/2015 (Exact Date) Comment: MAYBE ONCE A YEAR  SpO2 98%   Weight yesterday-128 Last visit weight-127  ATF pt CAO x4 standing outside with no complaints. [employer] still hasn't set entresto to her apartment, so she's still out.  She's  taken all of her medications for the week. She said that she doesn't want to continue using the nicotine patches because they're making her have nightmares.  She denies sob, chest pain and dizziness.  rx bottles verified and pill box refilled.     Medication ordered: amiiodarone Carvedilol Digoxin Sertraline  Marley Charlot, EMT Paramedic [phone] 12/28/2018    ACTION: Home visit completed

## 2019-01-02 ENCOUNTER — Other Ambulatory Visit (HOSPITAL_COMMUNITY): Payer: Self-pay | Admitting: Cardiology

## 2019-01-02 MED ORDER — ENTRESTO 97-103 MG PO TABS: 1.0000 | ORAL_TABLET | Freq: Two times a day (BID) | ORAL | 11 refills | Status: DC

## 2019-01-04 ENCOUNTER — Other Ambulatory Visit (HOSPITAL_COMMUNITY): Payer: Self-pay

## 2019-01-04 MED FILL — CARVEDILOL 3.125 MG TABLET: 3.125 | 30 days supply | Qty: 60 | Fill #2

## 2019-01-04 MED FILL — DIGITEK 125 MCG TABLET: 125 | 30 days supply | Qty: 30 | Fill #1

## 2019-01-04 MED FILL — SERTRALINE HCL 25 MG TABS: 25 | 30 days supply | Qty: 60 | Fill #3

## 2019-01-04 MED FILL — AMIODARONE HCL 200 MG TAB: 200 | 30 days supply | Qty: 30 | Fill #1

## 2019-01-06 NOTE — Progress Notes (Signed)
Paramedicine Encounter    Patient ID: Sabrina Holland, female    DOB: [birth year], 48 y.o.   MRN: [id number]    Patient Care Team: Gildardo Pounds, NP as PCP - General (Nurse Practitioner) Sanda Klein, MD as PCP - Cardiology (Cardiology)  Patient Active Problem List   Diagnosis Date Noted  . Palliative care by specialist   . Goals of care, counseling/discussion   . Acute respiratory failure ([place])   . Endotracheally intubated   . Torsades de pointes ([place])   . Cardiac arrest ([place])   . Atrial flutter ([place]) 07/03/2018  . Elevated LFTs 04/18/2018  . Malnutrition of moderate degree 02/27/2018  . Dyspnea 02/26/2018  . Acute on chronic systolic and diastolic heart failure, NYHA class 3 ([place]) 01/20/2018  . Cocaine abuse ([place]) 12/19/2017  . Mixed hyperlipidemia 12/19/2017  . COPD exacerbation ([place])   . Acute on chronic respiratory failure with hypoxemia ([place]) 12/03/2017  . Cardiomyopathy ([place]) 07/04/2017  . COPD (chronic obstructive pulmonary disease) ([place])   . Chronic combined systolic and diastolic heart failure ([place])   . Anemia 06/20/2017  . Polysubstance abuse ([place]) 11/26/2011  . TIA (transient ischemic attack) 11/25/2011  . Essential hypertension 11/25/2011  . Sinusitis 11/25/2011  . Vitamin B 12 deficiency 07/18/2011  . History of breast cancer 02/23/2011    Current Outpatient Medications:  .  albuterol (VENTOLIN HFA) 108 (90 Base) MCG/ACT inhaler, Inhale 2 puffs into the lungs every 6 (six) hours as needed for wheezing or shortness of breath (cough). Will pick up scripts tomorrow 7-9., Disp: 18 g, Rfl: 1 .  amiodarone (PACERONE) 200 MG tablet, Take 1 tablet (200 mg total) by mouth daily., Disp: 60 tablet, Rfl: 3 .  apixaban (ELIQUIS) 5 MG TABS tablet, Take 1 tablet (5 mg total) by mouth 2 (two) times daily. Patient will pick up scripts today., Disp: 180 tablet, Rfl: 3 .  carvedilol (COREG) 3.125 MG tablet, Take 1 tablet (3.125 mg total) by mouth 2 (two) times  daily., Disp: 180 tablet, Rfl: 3 .  digoxin (LANOXIN) 0.125 MG tablet, Take 1 tablet (0.125 mg total) by mouth daily., Disp: 30 tablet, Rfl: 3 .  ferrous sulfate 325 (65 FE) MG tablet, Take 1 tablet (325 mg total) by mouth 2 (two) times daily with a meal., Disp: 60 tablet, Rfl: 0 .  fluticasone (FLONASE) 50 MCG/ACT nasal spray, Place 2 sprays into both nostrils daily., Disp: 16 g, Rfl: 0 .  folic acid (FOLVITE) 1 MG tablet, Take 1 tablet (1 mg total) by mouth daily., Disp: 30 tablet, Rfl: 2 .  sacubitril-valsartan (ENTRESTO) 97-103 MG, Take 1 tablet by mouth 2 (two) times daily., Disp: 60 tablet, Rfl: 11 .  sertraline (ZOLOFT) 50 MG tablet, Take 1 tablet (50 mg total) by mouth daily., Disp: 30 tablet, Rfl: 3 .  spironolactone (ALDACTONE) 25 MG tablet, Take 1 tablet (25 mg total) by mouth daily., Disp: 30 tablet, Rfl: 1 .  thiamine 100 MG tablet, Take 1 tablet (100 mg total) by mouth daily., Disp: 30 tablet, Rfl: 0 Allergies  Allergen Reactions  . Penicillins Other (See Comments)    Pt had slight throat swelling Has patient had a PCN reaction causing immediate rash, facial/tongue/throat swelling, SOB or lightheadedness with hypotension: yes Has patient had a PCN reaction causing severe rash involving mucus membranes or skin necrosis: No Has patient had a PCN reaction that required hospitalization No Has patient had a PCN reaction occurring within the last 10 years: No If all of  the above answers are "NO", then may proceed with Cephalosporin use.      Social History   Socioeconomic History  . Marital status: Single    Spouse name: Not on file  . Number of children: 4  . Years of education: Not on file  . Highest education level: Not on file  Occupational History  . Occupation: trying to get disability    Employer: [employer]  Tobacco Use  . Smoking status: Former Smoker    Packs/day: 5.00    Years: 30.00    Pack years: 150.00    Types: Cigarettes  . Smokeless tobacco: Never Used   . Tobacco comment: 1 or 2 cigarettes a day- approx 1 pack week  Substance and Sexual Activity  . Alcohol use: Not Currently    Alcohol/week: 12.0 standard drinks    Types: 12 Cans of beer per week    Comment: last drink was 5/19  . Drug use: Yes    Types: Marijuana, Cocaine    Comment: daily marijuana; last use of cocaine 05/02/18 but selling it  . Sexual activity: Not Currently    Birth control/protection: Surgical  Other Topics Concern  . Not on file  Social History Narrative   ** Merged History Encounter **       Social Determinants of Health   Financial Resource Strain:   . Difficulty of Paying Living Expenses: Not on file  Food Insecurity:   . Worried About Charity fundraiser in the Last Year: Not on file  . Ran Out of Food in the Last Year: Not on file  Transportation Needs:   . Lack of Transportation (Medical): Not on file  . Lack of Transportation (Non-Medical): Not on file  Physical Activity:   . Days of Exercise per Week: Not on file  . Minutes of Exercise per Session: Not on file  Stress:   . Feeling of Stress : Not on file  Social Connections:   . Frequency of Communication with Friends and Family: Not on file  . Frequency of Social Gatherings with Friends and Family: Not on file  . Attends Religious Services: Not on file  . Active Member of Clubs or Organizations: Not on file  . Attends Archivist Meetings: Not on file  . Marital Status: Not on file  Intimate Partner Violence:   . Fear of Current or Ex-Partner: Not on file  . Emotionally Abused: Not on file  . Physically Abused: Not on file  . Sexually Abused: Not on file    Physical Exam      Future Appointments  Date Time Provider [place]  01/10/2019 11:40 AM Bensimhon, Shaune Pascal, MD [facility] None     BP 136/84 (BP Location: Right Arm, Patient Position: Sitting, Cuff Size: Normal)   Pulse 76   Wt 124 lb (56.2 kg)   LMP 02/18/2015 (Exact Date) Comment: MAYBE ONCE A YEAR   BMI 20.63 kg/m   Weight yesterday-124 Last visit weight-128  ATF pt CAO x4. She has no complaints today.  I picked up her prescription refills from [facility] earlier. She has a balance with the pharmacy. She's still unemployed and is waiting for a discussion from social security disability.  She denies sob, chest pain and dizziness. Delene Loll was sent to [facility]; the pharmacist said that he couldn't process the prescription because he need a copy of her medicaid card.  rx bottles verified and pill box refilled.    Medication ordered: none Jojo Geving, EMT  Paramedic [phone] 01/06/2019    ACTION: Home visit completed

## 2019-01-10 ENCOUNTER — Encounter (HOSPITAL_COMMUNITY): Payer: Medicaid Other | Admitting: Internal Medicine

## 2019-01-15 ENCOUNTER — Other Ambulatory Visit (HOSPITAL_COMMUNITY): Payer: Self-pay

## 2019-01-16 NOTE — Progress Notes (Signed)
Paramedicine Encounter    Patient ID: Sabrina Holland, female    DOB: 18-Dec-1970, 48 y.o.   MRN: [id number]    Patient Care Team: Gildardo Pounds, NP as PCP - General (Nurse Practitioner) Sanda Klein, MD as PCP - Cardiology (Cardiology)  Patient Active Problem List   Diagnosis Date Noted  . Palliative care by specialist   . Goals of care, counseling/discussion   . Acute respiratory failure ([place])   . Endotracheally intubated   . Torsades de pointes ([place])   . Cardiac arrest ([place])   . Atrial flutter ([place]) 07/03/2018  . Elevated LFTs 04/18/2018  . Malnutrition of moderate degree 02/27/2018  . Dyspnea 02/26/2018  . Acute on chronic systolic and diastolic heart failure, NYHA class 3 ([place]) 01/20/2018  . Cocaine abuse ([place]) 12/19/2017  . Mixed hyperlipidemia 12/19/2017  . COPD exacerbation ([place])   . Acute on chronic respiratory failure with hypoxemia ([place]) 12/03/2017  . Cardiomyopathy ([place]) 07/04/2017  . COPD (chronic obstructive pulmonary disease) ([place])   . Chronic combined systolic and diastolic heart failure ([place])   . Anemia 06/20/2017  . Polysubstance abuse ([place]) 11/26/2011  . TIA (transient ischemic attack) 11/25/2011  . Essential hypertension 11/25/2011  . Sinusitis 11/25/2011  . Vitamin B 12 deficiency 07/18/2011  . History of breast cancer 02/23/2011    Current Outpatient Medications:  .  albuterol (VENTOLIN HFA) 108 (90 Base) MCG/ACT inhaler, Inhale 2 puffs into the lungs every 6 (six) hours as needed for wheezing or shortness of breath (cough). Will pick up scripts tomorrow 7-9., Disp: 18 g, Rfl: 1 .  amiodarone (PACERONE) 200 MG tablet, Take 1 tablet (200 mg total) by mouth daily., Disp: 60 tablet, Rfl: 3 .  apixaban (ELIQUIS) 5 MG TABS tablet, Take 1 tablet (5 mg total) by mouth 2 (two) times daily. Patient will pick up scripts today., Disp: 180 tablet, Rfl: 3 .  carvedilol (COREG) 3.125 MG tablet, Take 1 tablet (3.125 mg total) by mouth 2 (two) times  daily., Disp: 180 tablet, Rfl: 3 .  digoxin (LANOXIN) 0.125 MG tablet, Take 1 tablet (0.125 mg total) by mouth daily., Disp: 30 tablet, Rfl: 3 .  ferrous sulfate 325 (65 FE) MG tablet, Take 1 tablet (325 mg total) by mouth 2 (two) times daily with a meal., Disp: 60 tablet, Rfl: 0 .  fluticasone (FLONASE) 50 MCG/ACT nasal spray, Place 2 sprays into both nostrils daily., Disp: 16 g, Rfl: 0 .  folic acid (FOLVITE) 1 MG tablet, Take 1 tablet (1 mg total) by mouth daily., Disp: 30 tablet, Rfl: 2 .  sacubitril-valsartan (ENTRESTO) 97-103 MG, Take 1 tablet by mouth 2 (two) times daily., Disp: 60 tablet, Rfl: 11 .  sertraline (ZOLOFT) 50 MG tablet, Take 1 tablet (50 mg total) by mouth daily., Disp: 30 tablet, Rfl: 3 .  spironolactone (ALDACTONE) 25 MG tablet, Take 1 tablet (25 mg total) by mouth daily., Disp: 30 tablet, Rfl: 1 .  thiamine 100 MG tablet, Take 1 tablet (100 mg total) by mouth daily., Disp: 30 tablet, Rfl: 0 Allergies  Allergen Reactions  . Penicillins Other (See Comments)    Pt had slight throat swelling Has patient had a PCN reaction causing immediate rash, facial/tongue/throat swelling, SOB or lightheadedness with hypotension: yes Has patient had a PCN reaction causing severe rash involving mucus membranes or skin necrosis: No Has patient had a PCN reaction that required hospitalization No Has patient had a PCN reaction occurring within the last 10 years: No If all of  the above answers are "NO", then may proceed with Cephalosporin use.      Social History   Socioeconomic History  . Marital status: Single    Spouse name: Not on file  . Number of children: 4  . Years of education: Not on file  . Highest education level: Not on file  Occupational History  . Occupation: trying to get disability    Employer: [employer]  Tobacco Use  . Smoking status: Former Smoker    Packs/day: 5.00    Years: 30.00    Pack years: 150.00    Types: Cigarettes  . Smokeless tobacco: Never Used   . Tobacco comment: 1 or 2 cigarettes a day- approx 1 pack week  Substance and Sexual Activity  . Alcohol use: Not Currently    Alcohol/week: 12.0 standard drinks    Types: 12 Cans of beer per week    Comment: last drink was 5/19  . Drug use: Yes    Types: Marijuana, Cocaine    Comment: daily marijuana; last use of cocaine 05/02/18 but selling it  . Sexual activity: Not Currently    Birth control/protection: Surgical  Other Topics Concern  . Not on file  Social History Narrative   ** Merged History Encounter **       Social Determinants of Health   Financial Resource Strain:   . Difficulty of Paying Living Expenses: Not on file  Food Insecurity:   . Worried About Charity fundraiser in the Last Year: Not on file  . Ran Out of Food in the Last Year: Not on file  Transportation Needs:   . Lack of Transportation (Medical): Not on file  . Lack of Transportation (Non-Medical): Not on file  Physical Activity:   . Days of Exercise per Week: Not on file  . Minutes of Exercise per Session: Not on file  Stress:   . Feeling of Stress : Not on file  Social Connections:   . Frequency of Communication with Friends and Family: Not on file  . Frequency of Social Gatherings with Friends and Family: Not on file  . Attends Religious Services: Not on file  . Active Member of Clubs or Organizations: Not on file  . Attends Archivist Meetings: Not on file  . Marital Status: Not on file  Intimate Partner Violence:   . Fear of Current or Ex-Partner: Not on file  . Emotionally Abused: Not on file  . Physically Abused: Not on file  . Sexually Abused: Not on file    Physical Exam Pulmonary:     Effort: No respiratory distress.     Breath sounds: No wheezing or rales.  Abdominal:     General: There is no distension.  Musculoskeletal:     Right lower leg: No edema.     Left lower leg: No edema.  Skin:    General: Skin is warm and dry.         Future Appointments  Date  Time Provider [place]  01/31/2019 11:40 AM Bensimhon, Shaune Pascal, MD [facility] None     BP 114/78 (BP Location: Right Arm, Patient Position: Sitting, Cuff Size: Normal)   Pulse 86   Temp 98.9 F (37.2 C)   Wt 127 lb (57.6 kg)   LMP 02/18/2015 (Exact Date) Comment: MAYBE ONCE A YEAR  SpO2 98%   BMI 21.13 kg/m   Weight yesterday-127 Last visit weight-124  ATF pt CAO x4 sitting on the porch with no complaints. She said  that she was "put out of her daughter's house".  She's currently staying with her boyfriend.  Housing authority hasn't sent anymore letters since she was approved.  She denies chest pain, sob, and dizziness.  She remains drug free and continues to watch what she eats.  rx bottles verified and pill box refilled.     Medication ordered: None  Jelicia Nantz, EMT Paramedic [phone] 01/17/2019    ACTION: Home visit completed

## 2019-01-23 ENCOUNTER — Telehealth (HOSPITAL_COMMUNITY): Payer: Self-pay | Admitting: Licensed Clinical Social Worker

## 2019-01-23 ENCOUNTER — Telehealth (HOSPITAL_COMMUNITY): Payer: Self-pay

## 2019-01-23 NOTE — Telephone Encounter (Signed)
Contacted the pt regarding home visit this week for [facility]. She reports she is not available today and will call me back tomor.   Marylouise Stacks, EMT-Paramedic  01/23/19

## 2019-01-23 NOTE — Telephone Encounter (Signed)
CSW received call from pt requesting assistance with several concerns.  Pt reports that she is almost out of food and does not get more food stamps until next Friday- CSW able to pick up food from the [employer] food pant[facility] and take to pt.  Pt also reports lack of transport to appt next week and requesting bus passes- brought bus passes to pt alongside food.  Pt also hasn't heard from [facility] regarding her disability claim and request assistance- CSW called her case worker and left a message to inquire if they need additional documentation from [place]  [place] will continue to follow and assist as needed  Jorge Ny, [place] Worker [facility] Desk#: [phone] Cell#: [phone]

## 2019-01-24 ENCOUNTER — Telehealth (HOSPITAL_COMMUNITY): Payer: Self-pay

## 2019-01-24 NOTE — Telephone Encounter (Signed)
Spoke to [place] who advised she was in [place] and would not be able to meet this week. She stated she was capable of taking her medications on her own this week and would follow up with [facility] upon her return. Call complete.

## 2019-01-30 ENCOUNTER — Telehealth (HOSPITAL_COMMUNITY): Payer: Self-pay

## 2019-01-30 NOTE — Telephone Encounter (Signed)

## 2019-01-31 ENCOUNTER — Telehealth (HOSPITAL_COMMUNITY): Payer: Self-pay

## 2019-01-31 ENCOUNTER — Encounter (HOSPITAL_COMMUNITY): Payer: Self-pay | Admitting: Internal Medicine

## 2019-01-31 ENCOUNTER — Ambulatory Visit (HOSPITAL_COMMUNITY)
Admission: RE | Admit: 2019-01-31 | Discharge: 2019-01-31 | Disposition: A | Discharge status: Home / Self Care | Payer: Medicaid Other | Source: Ambulatory Visit | Attending: Internal Medicine | Admitting: Internal Medicine

## 2019-01-31 ENCOUNTER — Other Ambulatory Visit (HOSPITAL_COMMUNITY): Payer: Self-pay

## 2019-01-31 ENCOUNTER — Other Ambulatory Visit: Payer: Self-pay

## 2019-01-31 VITALS — BP 136/86 | HR 89 | Wt 128.9 lb

## 2019-01-31 DIAGNOSIS — Z9221 Personal history of antineoplastic chemotherapy: Secondary | ICD-10-CM | POA: Insufficient documentation

## 2019-01-31 DIAGNOSIS — I130 Hypertensive heart and chronic kidney disease with heart failure and stage 1 through stage 4 chronic kidney disease, or unspecified chronic kidney disease: Secondary | ICD-10-CM | POA: Insufficient documentation

## 2019-01-31 DIAGNOSIS — Z7901 Long term (current) use of anticoagulants: Secondary | ICD-10-CM | POA: Diagnosis not present

## 2019-01-31 DIAGNOSIS — Z79899 Other long term (current) drug therapy: Secondary | ICD-10-CM | POA: Diagnosis not present

## 2019-01-31 DIAGNOSIS — Z720 Tobacco use: Secondary | ICD-10-CM | POA: Diagnosis not present

## 2019-01-31 DIAGNOSIS — F17210 Nicotine dependence, cigarettes, uncomplicated: Secondary | ICD-10-CM | POA: Insufficient documentation

## 2019-01-31 DIAGNOSIS — N182 Chronic kidney disease, stage 2 (mild): Secondary | ICD-10-CM | POA: Diagnosis not present

## 2019-01-31 DIAGNOSIS — I340 Nonrheumatic mitral (valve) insufficiency: Secondary | ICD-10-CM | POA: Insufficient documentation

## 2019-01-31 DIAGNOSIS — Z8673 Personal history of transient ischemic attack (TIA), and cerebral infarction without residual deficits: Secondary | ICD-10-CM | POA: Insufficient documentation

## 2019-01-31 DIAGNOSIS — I5042 Chronic combined systolic (congestive) and diastolic (congestive) heart failure: Secondary | ICD-10-CM | POA: Diagnosis present

## 2019-01-31 DIAGNOSIS — F1411 Cocaine abuse, in remission: Secondary | ICD-10-CM | POA: Insufficient documentation

## 2019-01-31 DIAGNOSIS — Z8674 Personal history of sudden cardiac arrest: Secondary | ICD-10-CM | POA: Insufficient documentation

## 2019-01-31 DIAGNOSIS — I480 Paroxysmal atrial fibrillation: Secondary | ICD-10-CM | POA: Insufficient documentation

## 2019-01-31 DIAGNOSIS — F1910 Other psychoactive substance abuse, uncomplicated: Secondary | ICD-10-CM

## 2019-01-31 DIAGNOSIS — Z853 Personal history of malignant neoplasm of breast: Secondary | ICD-10-CM | POA: Diagnosis not present

## 2019-01-31 DIAGNOSIS — E785 Hyperlipidemia, unspecified: Secondary | ICD-10-CM | POA: Insufficient documentation

## 2019-01-31 DIAGNOSIS — F1911 Other psychoactive substance abuse, in remission: Secondary | ICD-10-CM | POA: Diagnosis not present

## 2019-01-31 DIAGNOSIS — J449 Chronic obstructive pulmonary disease, unspecified: Secondary | ICD-10-CM | POA: Insufficient documentation

## 2019-01-31 LAB — BASIC METABOLIC PANEL
Anion gap: 11 (ref 5–15)
BUN: 14 mg/dL (ref 6–20)
CO2: 23 mmol/L (ref 22–32)
Calcium: 9.3 mg/dL (ref 8.9–10.3)
Chloride: 104 mmol/L (ref 98–111)
Creatinine, Ser: 0.83 mg/dL (ref 0.44–1.00)
GFR calc Af Amer: >60 mL/min (ref >60)
GFR calc non Af Amer: >60 mL/min (ref >60)
Glucose, Bld: 100 mg/dL — ABNORMAL HIGH (ref 70–99)
Potassium: 4 mmol/L (ref 3.5–5.1)
Sodium: 138 mmol/L (ref 135–145)

## 2019-01-31 LAB — CBC
HCT: 38.1 % (ref 36.0–46.0)
Hemoglobin: 12.2 g/dL (ref 12.0–15.0)
MCH: 29.2 pg (ref 26.0–34.0)
MCHC: 32 g/dL (ref 30.0–36.0)
MCV: 91.1 fL (ref 80.0–100.0)
Platelets: 369 10*3/uL (ref 150–400)
RBC: 4.18 MIL/uL (ref 3.87–5.11)
RDW: 14.5 % (ref 11.5–15.5)
WBC: 5.8 10*3/uL (ref 4.0–10.5)
nRBC: 0 % (ref 0.0–0.2)

## 2019-01-31 LAB — MAGNESIUM: Magnesium: 2.1 mg/dL (ref 1.7–2.4)

## 2019-01-31 LAB — DIGOXIN LEVEL: Digoxin Level: 0.2 ng/mL — ABNORMAL LOW (ref 0.8–2.0)

## 2019-01-31 LAB — BRAIN NATRIURETIC PEPTIDE: B Natriuretic Peptide: 64.7 pg/mL (ref 0.0–100.0)

## 2019-01-31 MED ORDER — CARVEDILOL 6.25 MG PO TABS: 6.2500 mg | ORAL_TABLET | Freq: Two times a day (BID) | ORAL | 3 refills | Status: DC

## 2019-01-31 MED FILL — CARVEDILOL 6.25 MG TABLET: 6.25 | 90 days supply | Qty: 180 | Fill #0

## 2019-01-31 NOTE — Progress Notes (Signed)
Paramedicine Encounter   Patient ID: Sabrina Holland , female,   DOB: [birth year],49 y.o.,  MRN: [id number]   Met patient in clinic today with provider Dr.Benshimon.  Pt says that she feels really good.  She watches what she's eating and fluid intake.  She's still clean from crack/cocaine.  She continues to walk to the bus stop and staying active throughout the day.  Possible discussion about defiberator depending on her staying clean from crack/cocaine. If everything looks better with medications then they will discuss another plan of action.  He gave her the number to [place] quit line   Eco in two months to see    off of blood thinners 2 days before and 2 days afterwards Medication changes:  Carvedilol 6.71m BID  Time spent with patient    [place]14/2021   ACTION: Home visit completed

## 2019-01-31 NOTE — Patient Instructions (Signed)
Increase Carvedilol to 6.25 mg Twice daily   Labs done today, we will contact you for abnormal results  Your physician recommends that you schedule a follow-up appointment in: 2 months with echocardiogram  If you have any questions or concerns before your next appointment please send [place] a message through [place] or call our office at [phone].  At the [facility], you and your health needs are our priority. As part of our continuing mission to provide you with exceptional heart care, we have created designated Provider Care Teams. These Care Teams include your primary Cardiologist (physician) and Advanced Practice Providers (APPs- Physician Assistants and Nurse Practitioners) who all work together to provide you with the care you need, when you need it.   You may see any of the following providers on your designated Care Team at your next follow up: Marland Kitchen Dr Glori Bickers . Dr Loralie Champagne . Darrick Grinder, NP . Lyda Jester, PA . Audry Riles, PharmD   Please be sure to bring in all your medications bottles to every appointment.

## 2019-01-31 NOTE — Telephone Encounter (Signed)
I called Sabrina Holland to remind her of today's visit at the [facility].  There's no answer and her voice mail is full.

## 2019-01-31 NOTE — Progress Notes (Signed)
PCP: [name] NP Cardiologist: Dr Sallyanne Kuster  Primary HF Cardiologist: Dr Haroldine Laws   HPI: Sabrina Falck McLendonis a 49 y.o.femalewith a hx of ongoing polysubstance abuse (h/o cocaine, opiates, THC, tobacco abuse,priorhabitual ETOH), poor social situation,chronic systolic CHF EF [id number] (no prior ischemic w/u), HTN, mitral regurgitation, HLD,CKD II by labs,breast CA (s/p L mastectomy, chemo, tamoxifen), TIA, COPD.  She had a normal echocardiogram in 2013 as part of a work-up for TIA. In 06/2017  admitted with SOB and initially had left AMA. However, she returned and was found to have severe CHF with EF 25-30%, grade 3 DD. She was diuresed and started on guideline directed therapy. She has been following with Dr. Recardo Evangelist but therapy limited by her social situation and recurrent cocaine use. Echo 11/2017 showed EF 25-30%, mild MR, mild LAE, PASP 32mmHg. She was admitted in 02/2018 for recurrent HF. She was not taking Lasix as prescribed due to frequent urination at work. She refused coronary CT or cath at that time   Presented to [facility]2018 with increased SOB and chest tightness. +  For cocaine on admit. VF arrest on 07/06/18 with defibrillation x1 and intubation.ECHO showed severely reduced EF at 15%.  Treated for PNA. Had [place] with normal cors. Discharged to home on 07/12/2018.   Echo 10/20 EF 20-25%  Here with Sabrina Holland from [employer]. Says she feels wonderful. Breathing very good. Not needing inhaler. No edema, orthopnea or PND,. Watching sodium and fluid intake. Has been clean of cocaine x 4 months. Still smoking 2-3 cigs per day. Trying to quit with patched. Drinking wine about once a month. Compliant with medicines.   ROS: All systems negative except as listed in HPI, PMH and Problem List.  SH:  Social History   Socioeconomic History  . Marital status: Single    Spouse name: Not on file  . Number of children: 4  . Years of education: Not on file  . Highest education level: Not  on file  Occupational History  . Occupation: trying to get disability    Employer: [employer]  Tobacco Use  . Smoking status: Former Smoker    Packs/day: 5.00    Years: 30.00    Pack years: 150.00    Types: Cigarettes  . Smokeless tobacco: Never Used  . Tobacco comment: 1 or 2 cigarettes a day- approx 1 pack week  Substance and Sexual Activity  . Alcohol use: Not Currently    Alcohol/week: 12.0 standard drinks    Types: 12 Cans of beer per week    Comment: last drink was 5/19  . Drug use: Yes    Types: Marijuana, Cocaine    Comment: daily marijuana; last use of cocaine 05/02/18 but selling it  . Sexual activity: Not Currently    Birth control/protection: Surgical  Other Topics Concern  . Not on file  Social History Narrative   ** Merged History Encounter **       Social Determinants of Health   Financial Resource Strain:   . Difficulty of Paying Living Expenses: Not on file  Food Insecurity:   . Worried About Charity fundraiser in the Last Year: Not on file  . Ran Out of Food in the Last Year: Not on file  Transportation Needs:   . Lack of Transportation (Medical): Not on file  . Lack of Transportation (Non-Medical): Not on file  Physical Activity:   . Days of Exercise per Week: Not on file  . Minutes of Exercise per Session: Not on file  Stress:   . Feeling of Stress : Not on file  Social Connections:   . Frequency of Communication with Friends and Family: Not on file  . Frequency of Social Gatherings with Friends and Family: Not on file  . Attends Religious Services: Not on file  . Active Member of Clubs or Organizations: Not on file  . Attends Archivist Meetings: Not on file  . Marital Status: Not on file  Intimate Partner Violence:   . Fear of Current or Ex-Partner: Not on file  . Emotionally Abused: Not on file  . Physically Abused: Not on file  . Sexually Abused: Not on file    FH:  Family History  Problem Relation Age of Onset  . COPD  Father   . Sickle cell anemia Cousin        maternal cousin    Past Medical History:  Diagnosis Date  . Atrial flutter ([place]) 07/03/2018  . Breast cancer, stage 2 ([place]) 02/23/2011  . Chronic combined systolic and diastolic CHF (congestive heart failure) ([place])   . CKD (chronic kidney disease), stage II   . Cocaine use   . COPD (chronic obstructive pulmonary disease) ([place])   . Homelessness   . Hypertension   . Polysubstance abuse ([place])   . Poor social situation   . TIA (transient ischemic attack) 11/25/2011   "first time" (11/25/2011)    Current Outpatient Medications  Medication Sig Dispense Refill  . albuterol (VENTOLIN HFA) 108 (90 Base) MCG/ACT inhaler Inhale 2 puffs into the lungs every 6 (six) hours as needed for wheezing or shortness of breath (cough). Will pick up scripts tomorrow 7-9. 18 g 1  . amiodarone (PACERONE) 200 MG tablet Take 1 tablet (200 mg total) by mouth daily. 60 tablet 3  . apixaban (ELIQUIS) 5 MG TABS tablet Take 1 tablet (5 mg total) by mouth 2 (two) times daily. Patient will pick up scripts today. 180 tablet 3  . digoxin (LANOXIN) 0.125 MG tablet Take 1 tablet (0.125 mg total) by mouth daily. 30 tablet 3  . ferrous sulfate 325 (65 FE) MG tablet Take 1 tablet (325 mg total) by mouth 2 (two) times daily with a meal. 60 tablet 0  . fluticasone (FLONASE) 50 MCG/ACT nasal spray Place 2 sprays into both nostrils daily. 16 g 0  . folic acid (FOLVITE) 1 MG tablet Take 1 tablet (1 mg total) by mouth daily. 30 tablet 2  . sacubitril-valsartan (ENTRESTO) 97-103 MG Take 1 tablet by mouth 2 (two) times daily. 60 tablet 11  . sertraline (ZOLOFT) 50 MG tablet Take 1 tablet (50 mg total) by mouth daily. 30 tablet 3  . thiamine 100 MG tablet Take 1 tablet (100 mg total) by mouth daily. 30 tablet 0  . carvedilol (COREG) 3.125 MG tablet Take 1 tablet (3.125 mg total) by mouth 2 (two) times daily. 180 tablet 3  . spironolactone (ALDACTONE) 25 MG tablet Take 1 tablet (25 mg total) by  mouth daily. 30 tablet 1   No current facility-administered medications for this encounter.    Vitals:   01/31/19 1158  BP: 136/86  Pulse: 89  SpO2: 97%  Weight: 58.5 kg (128 lb 14.2 oz)   Wt Readings from Last 3 Encounters:  01/31/19 58.5 kg (128 lb 14.2 oz)  01/15/19 57.6 kg (127 lb)  01/04/19 56.2 kg (124 lb)    PHYSICAL EXAM:  General:  Well appearing. No resp difficulty HEENT: normal Neck: supple. no JVD. Carotids 2+ bilat; no  bruits. No lymphadenopathy or thryomegaly appreciated. Cor: PMI nondisplaced. Regular rate & rhythm. No rubs, gallops or murmurs. Lungs: clear Abdomen: soft, nontender, nondistended. No hepatosplenomegaly. No bruits or masses. Good bowel sounds. Extremities: no cyanosis, clubbing, rash, edema Neuro: alert & orientedx3, cranial nerves grossly intact. moves all 4 extremities w/o difficulty. Affect pleasant    ASSESSMENT & PLAN:  1. Chronic systolic HF  -Echo 0000000 w/ low EF 15% RV moderately to severely down. Suspect due to polysubstance abuse +/- tachy-induced. Cath 07/10/18 w/ normal cors. Repeat echo 10/20 showed EF 20-25%. RV systolic function mildly reduced.  - Doing well NYHA I-II - Increase carvedilol to 6.25 bid - Continue Entresto to 97-103 mg twice a day.  - Continue spiro 25 mg daily.  - Continue dig 0.125 mg.  - Unable to afford [place] - Will consider hydral/Nitrates at next visit - See back in 2 months with repeat echo. If remains EF still < 35% and remains clean can consider ICD - Labs today  2. Cardiac arrest VFF/TdP  - s/p Code Blue 07/06/18  with defib x 1 - cath w/ normal cors, EF 20-25% - denies palpitations, syncope/ near syncope  - See ICD discussion above.    3. PAF - Regular on exam. HR controlled in the 60s.  - Continue amio 200 mg daily. - Needs annual eye exam (just completed an exam last week) - Continue eliquis 5 mg twice a day. Denies abnormal bleeding.    4. Polysubstance abuse/cocaine abuse - now  clean x 4 months. Congratulated her   5. Tobacco Abuse.  -Continues to smoke 2-3 cigs/day.  -Gave her 1-800-QUIT-NOW line   Glori Bickers MD 12:16 PM

## 2019-02-01 ENCOUNTER — Telehealth (HOSPITAL_COMMUNITY): Payer: Self-pay | Admitting: Pharmacist

## 2019-02-01 NOTE — Telephone Encounter (Signed)
Patient Advocate Encounter   Received notification from [facility] Medicaid that prior authorization for Sabrina Holland is required.   PA submitted on [place] Tr[employer] Confirmation #: [id number] W Recipient ID: [id number] S Status is pending   Will continue to follow.  Audry Riles, PharmD, BCPS, BCCP, CPP Heart Failure Clinic Pharmacist [phone]

## 2019-02-05 NOTE — Telephone Encounter (Signed)
Advanced Heart Failure Patient Advocate Encounter  Prior Authorization for Delene Loll has been approved.    PA# [id number] Effective dates: 02/01/2019 - 01/27/2020  Patients co-pay is $3.00  Audry Riles, PharmD, BCPS, BCCP, CPP Heart Failure Clinic Pharmacist [phone]

## 2019-02-06 ENCOUNTER — Telehealth (HOSPITAL_COMMUNITY): Payer: Self-pay

## 2019-02-06 DIAGNOSIS — I10 Essential (primary) hypertension: Secondary | ICD-10-CM

## 2019-02-06 MED ORDER — SPIRONOLACTONE 25 MG PO TABS: 25.0000 mg | ORAL_TABLET | Freq: Every day | ORAL | 6 refills | Status: DC

## 2019-02-06 MED FILL — SPIRONOLACTONE 25 MG TABLET: 25 | 30 days supply | Qty: 30 | Fill #0

## 2019-02-06 NOTE — Telephone Encounter (Signed)
Care manager called to review patients med list. She wanted to know if patient was still on entresto, spiro and eliquis.  Advised patient is still on all medications.

## 2019-02-13 ENCOUNTER — Other Ambulatory Visit: Payer: Self-pay | Admitting: Nurse Practitioner

## 2019-02-13 ENCOUNTER — Other Ambulatory Visit (HOSPITAL_COMMUNITY): Payer: Self-pay

## 2019-02-13 MED FILL — DIGITEK 125 MCG TABLET: 125 | 30 days supply | Qty: 30 | Fill #2

## 2019-02-13 MED FILL — ENTRESTO 97 MG-103 MG TAB: 97-103 | 30 days supply | Qty: 60 | Fill #0

## 2019-02-13 MED FILL — AMIODARONE HCL 200 MG TAB: 200 | 30 days supply | Qty: 30 | Fill #2

## 2019-02-13 NOTE — Progress Notes (Signed)
Paramedicine Encounter    Patient ID: Sabrina Holland, female    DOB: 1970-07-16, 49 y.o.   MRN: [id number]    Patient Care Team: Gildardo Pounds, NP as PCP - General (Nurse Practitioner) Sanda Klein, MD as PCP - Cardiology (Cardiology)  Patient Active Problem List   Diagnosis Date Noted  . Palliative care by specialist   . Goals of care, counseling/discussion   . Acute respiratory failure ([place])   . Endotracheally intubated   . Torsades de pointes ([place])   . Cardiac arrest ([place])   . Atrial flutter ([place]) 07/03/2018  . Elevated LFTs 04/18/2018  . Malnutrition of moderate degree 02/27/2018  . Dyspnea 02/26/2018  . Acute on chronic systolic and diastolic heart failure, NYHA class 3 ([place]) 01/20/2018  . Cocaine abuse ([place]) 12/19/2017  . Mixed hyperlipidemia 12/19/2017  . COPD exacerbation ([place])   . Acute on chronic respiratory failure with hypoxemia ([place]) 12/03/2017  . Cardiomyopathy ([place]) 07/04/2017  . COPD (chronic obstructive pulmonary disease) ([place])   . Chronic combined systolic and diastolic heart failure ([place])   . Anemia 06/20/2017  . Polysubstance abuse ([place]) 11/26/2011  . TIA (transient ischemic attack) 11/25/2011  . Essential hypertension 11/25/2011  . Sinusitis 11/25/2011  . Vitamin B 12 deficiency 07/18/2011  . History of breast cancer 02/23/2011    Current Outpatient Medications:  .  amiodarone (PACERONE) 200 MG tablet, Take 1 tablet (200 mg total) by mouth daily., Disp: 60 tablet, Rfl: 3 .  apixaban (ELIQUIS) 5 MG TABS tablet, Take 1 tablet (5 mg total) by mouth 2 (two) times daily. Patient will pick up scripts today., Disp: 180 tablet, Rfl: 3 .  carvedilol (COREG) 6.25 MG tablet, Take 1 tablet (6.25 mg total) by mouth 2 (two) times daily., Disp: 180 tablet, Rfl: 3 .  digoxin (LANOXIN) 0.125 MG tablet, Take 1 tablet (0.125 mg total) by mouth daily., Disp: 30 tablet, Rfl: 3 .  ferrous sulfate 325 (65 FE) MG tablet, Take 1 tablet (325 mg total) by mouth 2  (two) times daily with a meal., Disp: 60 tablet, Rfl: 0 .  sacubitril-valsartan (ENTRESTO) 97-103 MG, Take 1 tablet by mouth 2 (two) times daily., Disp: 60 tablet, Rfl: 11 .  spironolactone (ALDACTONE) 25 MG tablet, Take 1 tablet (25 mg total) by mouth daily., Disp: 30 tablet, Rfl: 6 .  albuterol (VENTOLIN HFA) 108 (90 Base) MCG/ACT inhaler, Inhale 2 puffs into the lungs every 6 (six) hours as needed for wheezing or shortness of breath (cough). Will pick up scripts tomorrow 7-9., Disp: 18 g, Rfl: 1 .  fluticasone (FLONASE) 50 MCG/ACT nasal spray, Place 2 sprays into both nostrils daily., Disp: 16 g, Rfl: 0 .  folic acid (FOLVITE) 1 MG tablet, Take 1 tablet (1 mg total) by mouth daily., Disp: 30 tablet, Rfl: 2 .  sertraline (ZOLOFT) 25 MG tablet, Take 2 tablets (50 mg total) by mouth daily. Please make PCP appt., Disp: 60 tablet, Rfl: 0 .  thiamine 100 MG tablet, Take 1 tablet (100 mg total) by mouth daily., Disp: 30 tablet, Rfl: 0 Allergies  Allergen Reactions  . Penicillins Other (See Comments)    Pt had slight throat swelling Has patient had a PCN reaction causing immediate rash, facial/tongue/throat swelling, SOB or lightheadedness with hypotension: yes Has patient had a PCN reaction causing severe rash involving mucus membranes or skin necrosis: No Has patient had a PCN reaction that required hospitalization No Has patient had a PCN reaction occurring within the last 10 years:  No If all of the above answers are "NO", then may proceed with Cephalosporin use.      Social History   Socioeconomic History  . Marital status: Single    Spouse name: Not on file  . Number of children: 4  . Years of education: Not on file  . Highest education level: Not on file  Occupational History  . Occupation: trying to get disability    Employer: [employer]  Tobacco Use  . Smoking status: Former Smoker    Packs/day: 5.00    Years: 30.00    Pack years: 150.00    Types: Cigarettes  . Smokeless  tobacco: Never Used  . Tobacco comment: 1 or 2 cigarettes a day- approx 1 pack week  Substance and Sexual Activity  . Alcohol use: Not Currently    Alcohol/week: 12.0 standard drinks    Types: 12 Cans of beer per week    Comment: last drink was 5/19  . Drug use: Yes    Types: Marijuana, Cocaine    Comment: daily marijuana; last use of cocaine 05/02/18 but selling it  . Sexual activity: Not Currently    Birth control/protection: Surgical  Other Topics Concern  . Not on file  Social History Narrative   ** Merged History Encounter **       Social Determinants of Health   Financial Resource Strain:   . Difficulty of Paying Living Expenses: Not on file  Food Insecurity:   . Worried About Charity fundraiser in the Last Year: Not on file  . Ran Out of Food in the Last Year: Not on file  Transportation Needs:   . Lack of Transportation (Medical): Not on file  . Lack of Transportation (Non-Medical): Not on file  Physical Activity:   . Days of Exercise per Week: Not on file  . Minutes of Exercise per Session: Not on file  Stress:   . Feeling of Stress : Not on file  Social Connections:   . Frequency of Communication with Friends and Family: Not on file  . Frequency of Social Gatherings with Friends and Family: Not on file  . Attends Religious Services: Not on file  . Active Member of Clubs or Organizations: Not on file  . Attends Archivist Meetings: Not on file  . Marital Status: Not on file  Intimate Partner Violence:   . Fear of Current or Ex-Partner: Not on file  . Emotionally Abused: Not on file  . Physically Abused: Not on file  . Sexually Abused: Not on file    Physical Exam Pulmonary:     Effort: No respiratory distress.     Breath sounds: No wheezing or rales.  Abdominal:     General: There is no distension.  Musculoskeletal:        General: No swelling.     Right lower leg: No edema.     Left lower leg: No edema.  Skin:    General: Skin is warm and  dry.         Future Appointments  Date Time Provider [place]  04/01/2019  9:00 AM MC ECHO OP 1 [facility]  04/01/2019 10:00 AM Bensimhon, Shaune Pascal, MD [facility] None     BP 116/82 (BP Location: Left Arm, Patient Position: Sitting, Cuff Size: Normal)   Pulse 73   Temp 99 F (37.2 C)   Wt 132 lb (59.9 kg)   LMP 02/18/2015 (Exact Date) Comment: MAYBE ONCE A YEAR  SpO2 98%  BMI 21.97 kg/m   Weight yesterday-131 Last visit weight-128  ATF pt CAO x4 standing outside talking with her neighbors. She has no complaints today. She's back to living with her daughter; disability hasn't sent any information on her benefits. She's unable to afford the copay for her medications.  She's taken all of her meds without missing any this week.  She denies sob, chest pain and dizziness. rx bottles verified and pill box refilled.    Medication ordered: Amiodarone filled until Monday Digoxin filled until Monday Ferrous sulfate filled Thursday entresto completely out Sertraline sat Folic acid Thiamine 100mg  entresto [place], EMT Paramedic [phone] 02/17/2019    ACTION: Home visit completed

## 2019-02-26 ENCOUNTER — Telehealth (HOSPITAL_COMMUNITY): Payer: Self-pay

## 2019-02-26 NOTE — Telephone Encounter (Signed)
Attempted to contacted no answer, phone disconnected. Will continue to reach out.

## 2019-02-27 ENCOUNTER — Other Ambulatory Visit: Payer: Medicaid Other | Admitting: Hospice

## 2019-02-27 ENCOUNTER — Other Ambulatory Visit: Payer: Self-pay

## 2019-02-27 DIAGNOSIS — I5042 Chronic combined systolic (congestive) and diastolic (congestive) heart failure: Secondary | ICD-10-CM

## 2019-02-27 DIAGNOSIS — Z515 Encounter for palliative care: Secondary | ICD-10-CM

## 2019-02-27 NOTE — Progress Notes (Signed)
[facility] Palliative Care Consult Note Telephone: [phone]  Fax: [phone]  PATIENT NAME: Sabrina Holland DOB: 03/30/1970 MRN: [id number]  PRIMARY CARE PROVIDER:   Gildardo Pounds, NP  REFERRING PROVIDER:  Gildardo Pounds, NP [place],  [place] [address]  RRESPONSIBLE PARTY:   Self [phone] Sabrina Holland, son, [phone]       TELEHEALTH VISIT STATEMENT Due to the COVID-19 crisis, this visit was done via telephone from my office. It was initiated and consented to by this patient and/or family.  RECOMMENDATIONS/PLAN:   Advance Care Planning/Goals of Care: Telehealth Visit consisted of building trust and discussions on Palliative Medicine as specialized medical care for people living with serious illness, aimed at facilitating better quality of life through symptoms relief, assisting with advance care plan and establishing goals of care.  Patient shared she died 2018/08/03 but was brought back to life through CPR. She shared her faith in God and her work life at [employer] for over ten years. Therapeutic listening and emotional support provided. Patient affirmed she is a full code; goals of care include to maximize quality of life and symptom management. Symptom management: She denied any recent COPD exacerbation. She said she is not on oxygen and has not been using her inhalers. She denied edema related to CHF. She said she is off water pills but is adherent to her fluid restrictions: 1581ml/day. She reported no drug use; expressed gratitude for overcoming her cocaine addiction. She continues on Eliquis for Atrial flutter She denied depression and said 'I thank God I'm in a good place. I am doing well" Follow up: Palliative care will continue to follow patient for goals of care clarification and symptom management. I spent 50 minutes providing this consultation; time includes chart review and documentation. More than 50% of  the time in this consultation was spent on coordinating communicatio  HISTORY OF PRESENT ILLNESS:  Sabrina Holland is a 49 y.o. year old female with multiple medical problems including CHF, COPD, CKD stage 2, h/o breast cancer, cocaine addiction. Palliative Care was asked to help address goals of care.    CODE STATUS: Full  PPS: 60% HOSPICE ELIGIBILITY/DIAGNOSIS: TBD  PAST MEDICAL HISTORY:  Past Medical History:  Diagnosis Date  . Atrial flutter ([place]) 07/03/2018  . Breast cancer, stage 2 ([place]) 02/23/2011  . Chronic combined systolic and diastolic CHF (congestive heart failure) ([place])   . CKD (chronic kidney disease), stage II   . Cocaine use   . COPD (chronic obstructive pulmonary disease) ([place])   . Homelessness   . Hypertension   . Polysubstance abuse ([place])   . Poor social situation   . TIA (transient ischemic attack) 11/25/2011   "first time" (11/25/2011)    SOCIAL HX:  Social History   Tobacco Use  . Smoking status: Former Smoker    Packs/day: 5.00    Years: 30.00    Pack years: 150.00    Types: Cigarettes  . Smokeless tobacco: Never Used  . Tobacco comment: 1 or 2 cigarettes a day- approx 1 pack week  Substance Use Topics  . Alcohol use: Not Currently    Alcohol/week: 12.0 standard drinks    Types: 12 Cans of beer per week    Comment: last drink was 5/19    ALLERGIES:  Allergies  Allergen Reactions  . Penicillins Other (See Comments)    Pt had slight throat swelling Has patient had a PCN reaction causing immediate rash,  facial/tongue/throat swelling, SOB or lightheadedness with hypotension: yes Has patient had a PCN reaction causing severe rash involving mucus membranes or skin necrosis: No Has patient had a PCN reaction that required hospitalization No Has patient had a PCN reaction occurring within the last 10 years: No If all of the above answers are "NO", then may proceed with Cephalosporin use.      PERTINENT MEDICATIONS:  Outpatient Encounter  Medications as of 02/27/2019  Medication Sig  . albuterol (VENTOLIN HFA) 108 (90 Base) MCG/ACT inhaler Inhale 2 puffs into the lungs every 6 (six) hours as needed for wheezing or shortness of breath (cough). Will pick up scripts tomorrow 7-9.  . amiodarone (PACERONE) 200 MG tablet Take 1 tablet (200 mg total) by mouth daily.  Marland Kitchen apixaban (ELIQUIS) 5 MG TABS tablet Take 1 tablet (5 mg total) by mouth 2 (two) times daily. Patient will pick up scripts today.  . carvedilol (COREG) 6.25 MG tablet Take 1 tablet (6.25 mg total) by mouth 2 (two) times daily.  . digoxin (LANOXIN) 0.125 MG tablet Take 1 tablet (0.125 mg total) by mouth daily.  . ferrous sulfate 325 (65 FE) MG tablet Take 1 tablet (325 mg total) by mouth 2 (two) times daily with a meal.  . fluticasone (FLONASE) 50 MCG/ACT nasal spray Place 2 sprays into both nostrils daily.  . folic acid (FOLVITE) 1 MG tablet Take 1 tablet (1 mg total) by mouth daily.  . sacubitril-valsartan (ENTRESTO) 97-103 MG Take 1 tablet by mouth 2 (two) times daily.  . sertraline (ZOLOFT) 25 MG tablet Take 2 tablets (50 mg total) by mouth daily. Please make PCP appt.  . spironolactone (ALDACTONE) 25 MG tablet Take 1 tablet (25 mg total) by mouth daily.  Marland Kitchen thiamine 100 MG tablet Take 1 tablet (100 mg total) by mouth daily.   No facility-administered encounter medications on file as of 02/27/2019.    Teodoro Spray, NP

## 2019-03-06 ENCOUNTER — Telehealth (HOSPITAL_COMMUNITY): Payer: Self-pay

## 2019-03-06 ENCOUNTER — Telehealth (HOSPITAL_COMMUNITY): Payer: Self-pay | Admitting: Licensed Clinical Social Worker

## 2019-03-06 NOTE — Telephone Encounter (Signed)
Spoke to [place] in reference to home visit, patient stated she had only a few medications missing but wanted them transferred to [facility]. I contacted [facility] and they were able to fill patient's medications and I advised them she would prefer home delivery. [facility] advised patient picked up medications today and charged them to an account due to disability not being active with medicaid yet. I will be seeing patient next week to fill pill box. Will talk with patient about bubble packs to ensure she is getting appropriate medications and in a timely manner, as well as being followed by [facility]. Home visit to follow.

## 2019-03-06 NOTE — Telephone Encounter (Signed)
CSW received call from pt who is wondering if she is still connected with services now that her former [employer] has left.  CSW explained that she would now be seen by a different paramedic and that they attempted to call her last week to set up a visit.  Pt reports she is out of her digoxin and almost out of her spirolactone.   CSW called Clinical biochemist and confirmed she had pt best contact number and informed her of pt mediation concerns.  CSW will continue to follow and assist as needed  Jorge Ny, [facility] Desk#: [phone] Cell#: [phone]

## 2019-03-11 ENCOUNTER — Telehealth (HOSPITAL_COMMUNITY): Payer: Self-pay

## 2019-03-11 NOTE — Telephone Encounter (Signed)
Left message for patient to return my call in reference to home visit. Will continue to follow.

## 2019-03-29 ENCOUNTER — Telehealth (HOSPITAL_COMMUNITY): Payer: Self-pay | Admitting: Licensed Clinical Social Worker

## 2019-03-29 NOTE — Telephone Encounter (Signed)
CSW received call from pt requesting help with bus passes to get to some medical appts on Monday. CSW had [employer] take out several bus passes to pt to help attend appts.  CSW will continue to follow and assist as needed  Jorge Ny, [facility] Desk#: [phone] Cell#: [phone]

## 2019-04-01 ENCOUNTER — Ambulatory Visit (HOSPITAL_COMMUNITY): Admission: RE | Admit: 2019-04-01 | Payer: Medicaid Other | Source: Ambulatory Visit

## 2019-04-01 ENCOUNTER — Other Ambulatory Visit: Payer: Self-pay

## 2019-04-01 ENCOUNTER — Telehealth (HOSPITAL_COMMUNITY): Payer: Self-pay | Admitting: Licensed Clinical Social Worker

## 2019-04-01 ENCOUNTER — Other Ambulatory Visit: Payer: Medicaid Other | Admitting: Hospice

## 2019-04-01 ENCOUNTER — Encounter (HOSPITAL_COMMUNITY): Payer: Medicaid Other | Admitting: Internal Medicine

## 2019-04-01 DIAGNOSIS — I5042 Chronic combined systolic (congestive) and diastolic (congestive) heart failure: Secondary | ICD-10-CM

## 2019-04-01 DIAGNOSIS — Z515 Encounter for palliative care: Secondary | ICD-10-CM

## 2019-04-01 NOTE — Telephone Encounter (Signed)
CSW called pt to discuss no show to echo and MD appt today.  Pt reports she accidentally slept in but does want to reschedule- informed clinic who will have pt called to get scheduled.  Pt also reports her housing situation is very unstable right now and is going back and forth between living with her daughter and her friend.  CSW inquired about what is going on and if she needs assistance but pt does not wish to discuss at this time- CSW encouraged her to call if she needed help with finding alternative housing or wanted to discuss.  CSW then inquired about [employer]- pt does not want to see paramedic at this time until her living situation is more stable- she will call us when she is ready to start being seen again but informed her we would be taking her off our caseload for now but she can call us with any needs regardless.  CSW will continue to follow and assist as needed  Jorge Ny, [facility] Desk#: [phone] Cell#: [phone]

## 2019-04-01 NOTE — Progress Notes (Signed)
[facility] Consult Note Telephone: [phone]  Fax: [phone]  PATIENT NAME: Sabrina Holland DOB: 02-27-1970 MRN: [id number]  PRIMARY CARE PROVIDER:   Gildardo Pounds, NP  REFERRING PROVIDER:  Gildardo Pounds, NP [place],  [place] [address]  [place] Simone Hindi, son, [phone]   TELEHEALTH VISIT STATEMENT Due to the COVID-19 crisis, this visit was done via telephone from my office. It was initiated and consented to by this patient and/or family.  RECOMMENDATIONS/PLAN:   Advance Care Planning/Goals of Care: Scheduled in person visit changed to telehealth Visit because patient not home.  Patient affirmed she is a full code; goals of care include to maximize quality of life and symptom management. From previous visit, patient shared that in July 05 2018 she was brought back to life through CPR; she had shared her faith in [place] and her work life at [employer] for over ten years.  Symptom management: Patient said overall she is doing well, with no complain or concerns. She denied any recent COPD exacerbation. Patient not on oxygen or  inhalers. She denied edema related to CHF. She said she is off water pills but is adherent to her fluid restrictions: 1568ml/day. She reported no drug use; she is overcame cocaine addiction, no depression. She continues on Eliquis for Atrial flutter  Follow up: Palliative care will continue to follow patient for goals of care clarification and symptom management. I spent 30 minutes providing this consultation; time includes chart review and documentation. More than 50% of the time in this consultation was spent on coordinating communicatio  HISTORY OF PRESENT ILLNESS:Sabrina Lavonya McLendonis a 49 y.o.year oldfemalewith multiple medical problems including CHF, COPD, CKD stage 2, h/o breast cancer, cocaine addiction. Palliative Care was asked to  help address goals of care.  CODE STATUS: Full  PPS: 60% HOSPICE ELIGIBILITY/DIAGNOSIS: TBD  PAST MEDICAL HISTORY:  Past Medical History:  Diagnosis Date  . Atrial flutter ([place]) 07/03/2018  . Breast cancer, stage 2 ([place]) 02/23/2011  . Chronic combined systolic and diastolic CHF (congestive heart failure) ([place])   . CKD (chronic kidney disease), stage II   . Cocaine use   . COPD (chronic obstructive pulmonary disease) ([place])   . Homelessness   . Hypertension   . Polysubstance abuse ([place])   . Poor social situation   . TIA (transient ischemic attack) 11/25/2011   "first time" (11/25/2011)    SOCIAL HX:  Social History   Tobacco Use  . Smoking status: Former Smoker    Packs/day: 5.00    Years: 30.00    Pack years: 150.00    Types: Cigarettes  . Smokeless tobacco: Never Used  . Tobacco comment: 1 or 2 cigarettes a day- approx 1 pack week  Substance Use Topics  . Alcohol use: Not Currently    Alcohol/week: 12.0 standard drinks    Types: 12 Cans of beer per week    Comment: last drink was 5/19    ALLERGIES:  Allergies  Allergen Reactions  . Penicillins Other (See Comments)    Pt had slight throat swelling Has patient had a PCN reaction causing immediate rash, facial/tongue/throat swelling, SOB or lightheadedness with hypotension: yes Has patient had a PCN reaction causing severe rash involving mucus membranes or skin necrosis: No Has patient had a PCN reaction that required hospitalization No Has patient had a PCN reaction occurring within the last 10 years: No If all of the above answers are "NO", then  may proceed with Cephalosporin use.      PERTINENT MEDICATIONS:  Outpatient Encounter Medications as of 04/01/2019  Medication Sig  . albuterol (VENTOLIN HFA) 108 (90 Base) MCG/ACT inhaler Inhale 2 puffs into the lungs every 6 (six) hours as needed for wheezing or shortness of breath (cough). Will pick up scripts tomorrow 7-9.  . amiodarone (PACERONE) 200 MG tablet Take 1  tablet (200 mg total) by mouth daily.  Marland Kitchen apixaban (ELIQUIS) 5 MG TABS tablet Take 1 tablet (5 mg total) by mouth 2 (two) times daily. Patient will pick up scripts today.  . carvedilol (COREG) 6.25 MG tablet Take 1 tablet (6.25 mg total) by mouth 2 (two) times daily.  . digoxin (LANOXIN) 0.125 MG tablet Take 1 tablet (0.125 mg total) by mouth daily.  . ferrous sulfate 325 (65 FE) MG tablet Take 1 tablet (325 mg total) by mouth 2 (two) times daily with a meal.  . fluticasone (FLONASE) 50 MCG/ACT nasal spray Place 2 sprays into both nostrils daily.  . folic acid (FOLVITE) 1 MG tablet Take 1 tablet (1 mg total) by mouth daily.  . sacubitril-valsartan (ENTRESTO) 97-103 MG Take 1 tablet by mouth 2 (two) times daily.  . sertraline (ZOLOFT) 25 MG tablet Take 2 tablets (50 mg total) by mouth daily. Please make PCP appt.  . spironolactone (ALDACTONE) 25 MG tablet Take 1 tablet (25 mg total) by mouth daily.  Marland Kitchen thiamine 100 MG tablet Take 1 tablet (100 mg total) by mouth daily.   No facility-administered encounter medications on file as of 04/01/2019.   Teodoro Spray, NP

## 2019-04-04 ENCOUNTER — Other Ambulatory Visit: Payer: Self-pay | Admitting: Nurse Practitioner

## 2019-04-29 ENCOUNTER — Other Ambulatory Visit: Payer: Self-pay | Admitting: Nurse Practitioner

## 2019-05-21 ENCOUNTER — Ambulatory Visit: Payer: Medicaid Other | Admitting: Nurse Practitioner

## 2019-05-29 ENCOUNTER — Ambulatory Visit: Payer: Medicaid Other | Admitting: Nurse Practitioner

## 2019-06-10 ENCOUNTER — Other Ambulatory Visit: Payer: Self-pay

## 2019-06-10 ENCOUNTER — Other Ambulatory Visit: Payer: Medicaid Other | Admitting: Hospice

## 2019-06-10 DIAGNOSIS — Z515 Encounter for palliative care: Secondary | ICD-10-CM

## 2019-06-10 DIAGNOSIS — I5042 Chronic combined systolic (congestive) and diastolic (congestive) heart failure: Secondary | ICD-10-CM

## 2019-06-10 NOTE — Progress Notes (Signed)
[facility] Consult Note Telephone: [phone]  Fax: [phone]  PATIENT NAME: Sabrina Holland DOB: 1970-12-27 MRN: [id number]  PRIMARY CARE PROVIDER:   Gildardo Pounds, NP  REFERRING PROVIDER: Gildardo Pounds, NP  [place] PARTY:Self [phone] Tarisha Herbst, son, [phone]   TELEHEALTH VISIT STATEMENT Due to the COVID-19 crisis, this visit was done via telephone from my office. It was initiated and consented to by this patient and/or family.  RECOMMENDATIONS/PLAN:  Advance Care Planning/Goals of Care:  Visit is to build trust and follow-up on palliative care.  Patient is a full code; goals of care include to maximize quality of life and symptom management. From previous visit, patient shared that in July 05 2018 she was brought back to life through CPR; she shared her faith in [place] and her work life at [employer] for over ten years. Palliative care team will continue to support patient, patient's family, and medical team. Symptom management: Patient with no complain or concerns; denies weakness, o coughing, no shortness of breath.   She recently started a job, 25 hours/week.  She denied any recent COPD exacerbation. Patient not on oxygen or  inhalers. She denied edema related to CHF. She said she is off water pills but is adherent to her fluid restrictions: 1568ml/day.  She verbalized understanding on the need to take care of herself; she states she eats properly, takes her medications as prescribed, and keeps her body moving and busy.  Validation provided.  She reported no drug use; she is overcame cocaine addiction, no depression. She continues on Eliquis for Atrial flutter.  No hospitalizations since last visit.  Patient in no medical acuity.  NP encouraged ongoing care. Follow [id number] care will continue to follow patient for goals of care clarification and symptom management. I spent35 minutes providing  this consultation; time includes chart review and documentation. More than 50% of the time in this consultation was spent on coordinating communicatio  HISTORY OF PRESENT ILLNESS:Jazelyn Lavonya McLendonis a 49 y.o.year oldfemalewith multiple medical problems including CHF, COPD, CKD stage 2, h/o breast cancer, cocaine addiction. Palliative Care was asked to help address goals of care.    CODE STATUS: Full  PPS: 60% HOSPICE ELIGIBILITY/DIAGNOSIS: TBD  PAST MEDICAL HISTORY:  Past Medical History:  Diagnosis Date  . Atrial flutter ([place]) 07/03/2018  . Breast cancer, stage 2 ([place]) 02/23/2011  . Chronic combined systolic and diastolic CHF (congestive heart failure) ([place])   . CKD (chronic kidney disease), stage II   . Cocaine use   . COPD (chronic obstructive pulmonary disease) ([place])   . Homelessness   . Hypertension   . Polysubstance abuse ([place])   . Poor social situation   . TIA (transient ischemic attack) 11/25/2011   "first time" (11/25/2011)    SOCIAL HX:  Social History   Tobacco Use  . Smoking status: Former Smoker    Packs/day: 5.00    Years: 30.00    Pack years: 150.00    Types: Cigarettes  . Smokeless tobacco: Never Used  . Tobacco comment: 1 or 2 cigarettes a day- approx 1 pack week  Substance Use Topics  . Alcohol use: Not Currently    Alcohol/week: 12.0 standard drinks    Types: 12 Cans of beer per week    Comment: last drink was 5/19    ALLERGIES:  Allergies  Allergen Reactions  . Penicillins Other (See Comments)    Pt had slight throat swelling Has patient had a PCN reaction  causing immediate rash, facial/tongue/throat swelling, SOB or lightheadedness with hypotension: yes Has patient had a PCN reaction causing severe rash involving mucus membranes or skin necrosis: No Has patient had a PCN reaction that required hospitalization No Has patient had a PCN reaction occurring within the last 10 years: No If all of the above answers are "NO", then may proceed  with Cephalosporin use.      PERTINENT MEDICATIONS:  Outpatient Encounter Medications as of 06/10/2019  Medication Sig  . albuterol (VENTOLIN HFA) 108 (90 Base) MCG/ACT inhaler Inhale 2 puffs into the lungs every 6 (six) hours as needed for wheezing or shortness of breath (cough). Will pick up scripts tomorrow 7-9.  . amiodarone (PACERONE) 200 MG tablet Take 1 tablet (200 mg total) by mouth daily.  Marland Kitchen apixaban (ELIQUIS) 5 MG TABS tablet Take 1 tablet (5 mg total) by mouth 2 (two) times daily. Patient will pick up scripts today.  . carvedilol (COREG) 6.25 MG tablet Take 1 tablet (6.25 mg total) by mouth 2 (two) times daily.  . digoxin (LANOXIN) 0.125 MG tablet Take 1 tablet (0.125 mg total) by mouth daily.  . ferrous sulfate 325 (65 FE) MG tablet Take 1 tablet (325 mg total) by mouth 2 (two) times daily with a meal.  . fluticasone (FLONASE) 50 MCG/ACT nasal spray Place 2 sprays into both nostrils daily.  . folic acid (FOLVITE) 1 MG tablet Take 1 tablet (1 mg total) by mouth daily.  . sacubitril-valsartan (ENTRESTO) 97-103 MG Take 1 tablet by mouth 2 (two) times daily.  . sertraline (ZOLOFT) 25 MG tablet Take 2 tablets (50 mg total) by mouth daily. Please make PCP appt.  . spironolactone (ALDACTONE) 25 MG tablet Take 1 tablet (25 mg total) by mouth daily.  Marland Kitchen thiamine 100 MG tablet Take 1 tablet (100 mg total) by mouth daily.   No facility-administered encounter medications on file as of 06/10/2019.    Teodoro Spray, NP

## 2019-06-11 ENCOUNTER — Encounter (HOSPITAL_COMMUNITY): Payer: Medicaid Other | Admitting: Internal Medicine

## 2019-06-11 ENCOUNTER — Ambulatory Visit (HOSPITAL_COMMUNITY): Admission: RE | Admit: 2019-06-11 | Payer: Medicaid Other | Source: Ambulatory Visit

## 2019-06-12 ENCOUNTER — Other Ambulatory Visit (HOSPITAL_COMMUNITY): Payer: Self-pay | Admitting: Adult Health

## 2019-06-18 ENCOUNTER — Telehealth (HOSPITAL_COMMUNITY): Payer: Self-pay | Admitting: Pharmacy Technician

## 2019-06-18 NOTE — Telephone Encounter (Signed)
Received notice from [employer] that patient would need to re-apply for assistance with her Eliquis or she will be terminated from the program 08/06/19.  Patient now has medicaid, co-pay is $3. We will not need to seek assistance at this time.   Charlann Boxer, CPhT

## 2019-06-26 ENCOUNTER — Telehealth: Payer: Self-pay

## 2019-06-26 NOTE — Telephone Encounter (Signed)
Call received from patient.  She said that she received a denial letter from social security regarding her disability and wanted to contact legal aid for assistance with the appeal. She was agreeable to making a new referral to legal aide.  Her phone number was confirmed.   Referral sent via [place] email to [employer] [facility] [place]

## 2019-07-11 IMAGING — CR DG CHEST 2V
2 series · 2 of 2 positions shown · non-contrast
Comparison: 01/19/2018

CLINICAL DATA: Shortness of breath

EXAM:
CHEST - 2 VIEW

[w chest lat]
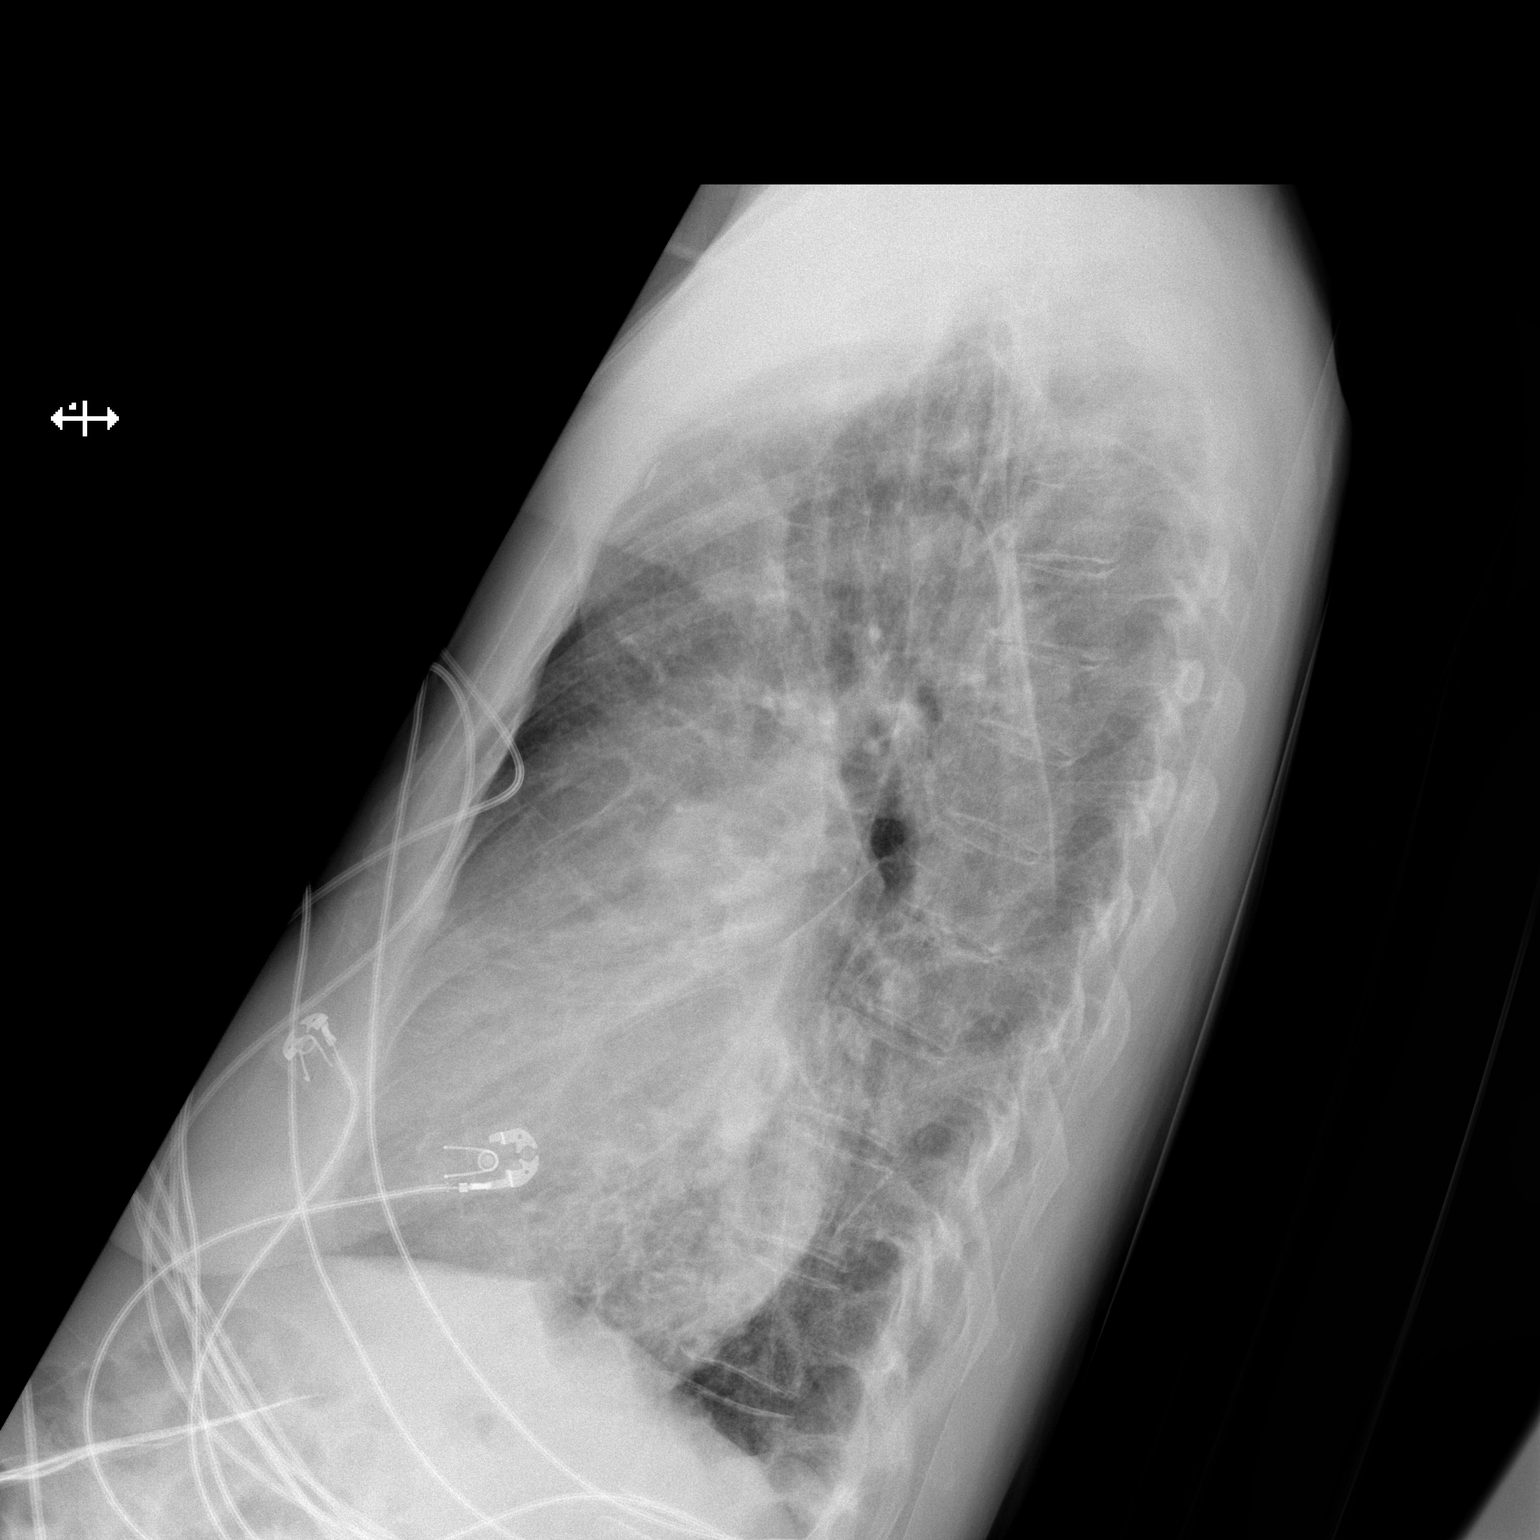

[x chest ap]
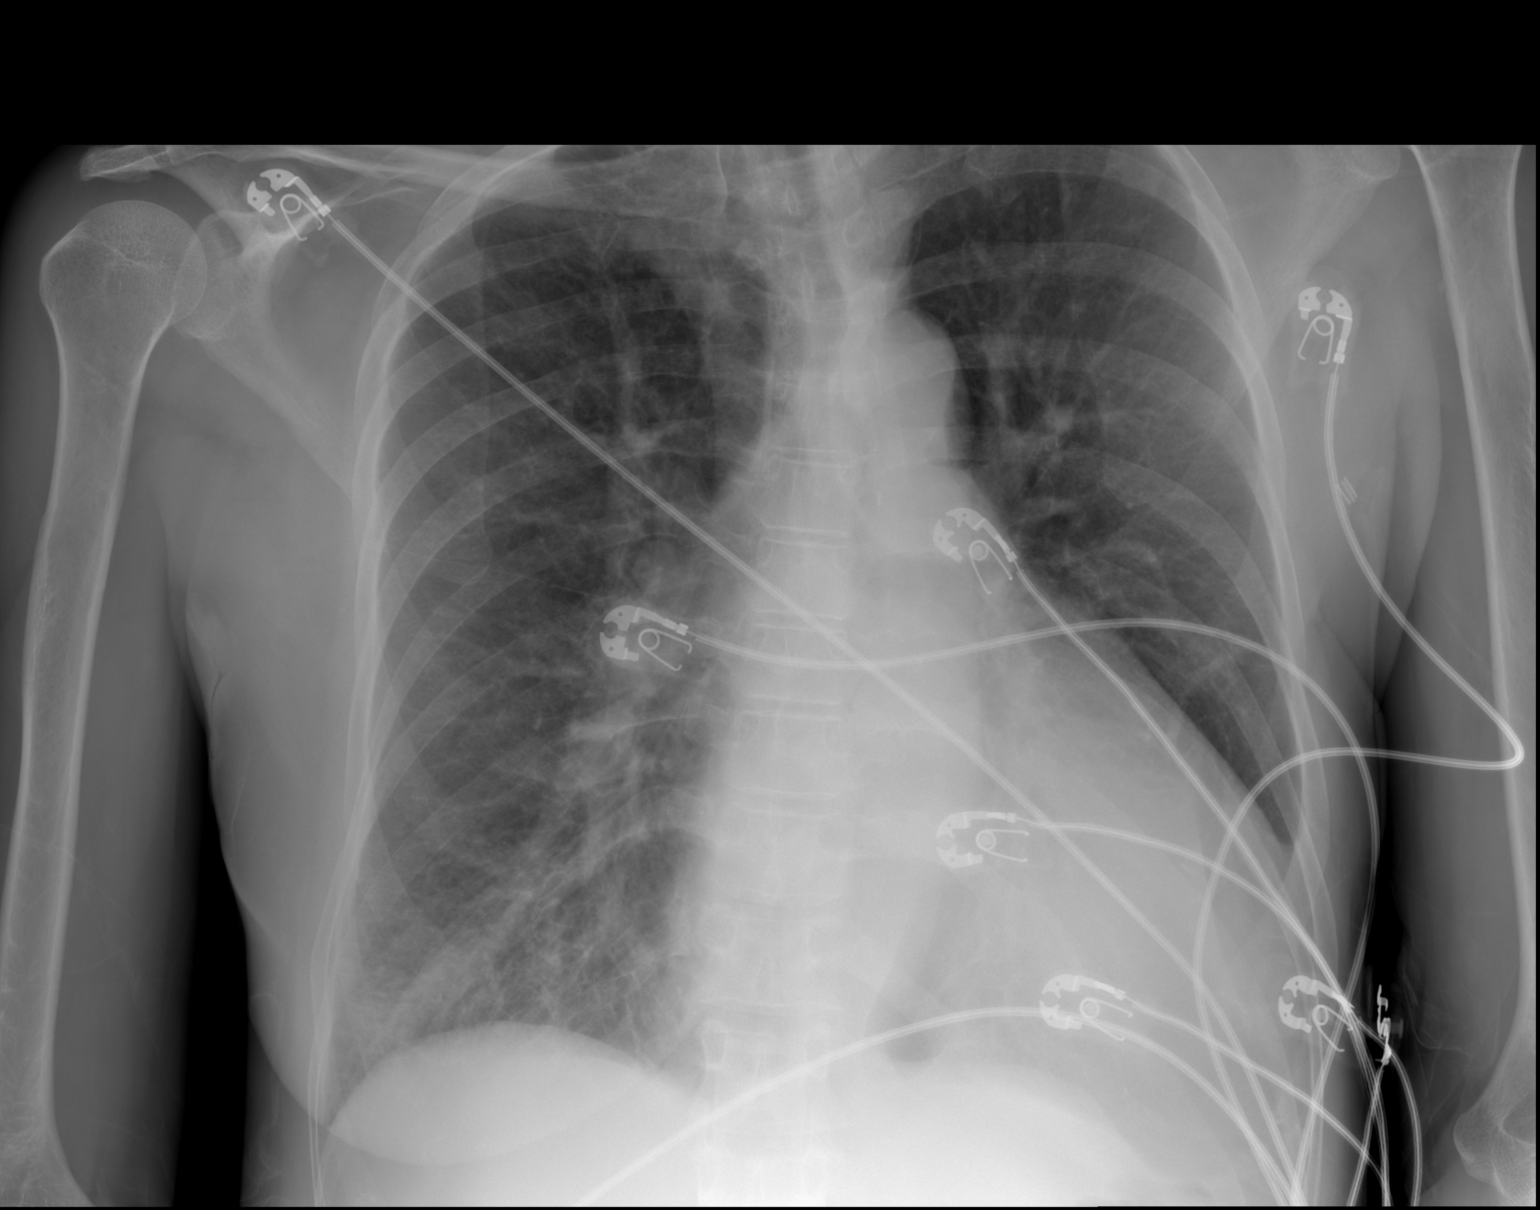

[2 of 2 positions shown; findings below may reference images not displayed]

FINDINGS: Chronic cardiomegaly. Interstitial coarsening above 7771 baseline.
Hyperinflation. No air bronchogram, effusion or visible
pneumothorax. Stable bandlike density at the left base consistent
with scarring.
IMPRESSION: 1. Mild congestive or bronchitic interstitial coarsening.
2. COPD and cardiomegaly.

## 2019-07-17 ENCOUNTER — Other Ambulatory Visit (HOSPITAL_COMMUNITY): Payer: Self-pay

## 2019-07-17 DIAGNOSIS — I4892 Unspecified atrial flutter: Secondary | ICD-10-CM

## 2019-07-20 ENCOUNTER — Other Ambulatory Visit (HOSPITAL_COMMUNITY): Payer: Self-pay | Admitting: Internal Medicine

## 2019-07-20 DIAGNOSIS — I4892 Unspecified atrial flutter: Secondary | ICD-10-CM

## 2019-07-26 ENCOUNTER — Telehealth: Payer: Self-pay | Admitting: Hospice

## 2019-07-26 NOTE — Telephone Encounter (Signed)
Spoke with patient regarding rescheduling [facility] Palliative visit on 08-07-19 and she said she was at work and asked for [place] to call back.

## 2019-07-29 ENCOUNTER — Encounter: Payer: Self-pay | Admitting: Nurse Practitioner

## 2019-07-29 ENCOUNTER — Other Ambulatory Visit (HOSPITAL_COMMUNITY)
Admission: RE | Admit: 2019-07-29 | Discharge: 2019-07-29 | Disposition: A | Discharge status: Home / Self Care | Payer: Medicaid Other | Source: Ambulatory Visit | Attending: Nurse Practitioner | Admitting: Nurse Practitioner

## 2019-07-29 ENCOUNTER — Other Ambulatory Visit: Payer: Self-pay

## 2019-07-29 ENCOUNTER — Ambulatory Visit: Payer: Medicaid Other | Attending: Nurse Practitioner | Admitting: Nurse Practitioner

## 2019-07-29 VITALS — BP 135/89 | HR 75 | Temp 97.7°F | Wt 121.0 lb

## 2019-07-29 DIAGNOSIS — F32A Depression, unspecified: Secondary | ICD-10-CM

## 2019-07-29 DIAGNOSIS — R7303 Prediabetes: Secondary | ICD-10-CM | POA: Diagnosis not present

## 2019-07-29 DIAGNOSIS — F329 Major depressive disorder, single episode, unspecified: Secondary | ICD-10-CM

## 2019-07-29 DIAGNOSIS — E559 Vitamin D deficiency, unspecified: Secondary | ICD-10-CM

## 2019-07-29 DIAGNOSIS — N898 Other specified noninflammatory disorders of vagina: Secondary | ICD-10-CM

## 2019-07-29 DIAGNOSIS — D649 Anemia, unspecified: Secondary | ICD-10-CM

## 2019-07-29 DIAGNOSIS — I10 Essential (primary) hypertension: Secondary | ICD-10-CM

## 2019-07-29 DIAGNOSIS — E782 Mixed hyperlipidemia: Secondary | ICD-10-CM | POA: Diagnosis not present

## 2019-07-29 DIAGNOSIS — I5042 Chronic combined systolic (congestive) and diastolic (congestive) heart failure: Secondary | ICD-10-CM

## 2019-07-29 DIAGNOSIS — F419 Anxiety disorder, unspecified: Secondary | ICD-10-CM

## 2019-07-29 DIAGNOSIS — Z114 Encounter for screening for human immunodeficiency virus [HIV]: Secondary | ICD-10-CM

## 2019-07-29 DIAGNOSIS — F1910 Other psychoactive substance abuse, uncomplicated: Secondary | ICD-10-CM

## 2019-07-29 LAB — POCT URINALYSIS DIP (CLINITEK)
Blood, UA: NEGATIVE
Glucose, UA: NEGATIVE mg/dL
Ketones, POC UA: NEGATIVE mg/dL
Nitrite, UA: NEGATIVE
POC PROTEIN,UA: NEGATIVE
Spec Grav, UA: >=1.03 — AB (ref 1.010–1.025)
Urobilinogen, UA: 1 E.U./dL
pH, UA: 5 (ref 5.0–8.0)

## 2019-07-29 LAB — POCT GLYCOSYLATED HEMOGLOBIN (HGB A1C): Hemoglobin A1C: 5.3 % (ref 4.0–5.6)

## 2019-07-29 LAB — GLUCOSE, POCT (MANUAL RESULT ENTRY): POC Glucose: 98 mg/dl (ref 70–99)

## 2019-07-29 MED ORDER — SERTRALINE HCL 50 MG PO TABS: 50.0000 mg | ORAL_TABLET | Freq: Every day | ORAL | 3 refills | Status: DC

## 2019-07-29 MED ORDER — FOLIC ACID 1 MG PO TABS: 1.0000 mg | ORAL_TABLET | Freq: Every day | ORAL | 2 refills | Status: DC

## 2019-07-29 MED ORDER — SPIRONOLACTONE 25 MG PO TABS: 25.0000 mg | ORAL_TABLET | Freq: Every day | ORAL | 0 refills | Status: DC

## 2019-07-29 MED ORDER — FERROUS SULFATE 325 (65 FE) MG PO TABS: 325.0000 mg | ORAL_TABLET | Freq: Two times a day (BID) | ORAL | 3 refills | Status: DC

## 2019-07-29 MED ORDER — THIAMINE HCL 100 MG PO TABS: 100.0000 mg | ORAL_TABLET | Freq: Every day | ORAL | 3 refills | Status: DC

## 2019-07-29 NOTE — Progress Notes (Signed)
Assessment & Plan:  [name] was seen today for follow-up.  Diagnoses and all orders for this visit:  Essential hypertension -     CMP14+EGFR Continue all antihypertensives as prescribed.  Remember to bring in your blood pressure log with you for your follow up appointment.  DASH/Mediterranean Diets are healthier choices for HTN.   Vitamin D deficiency disease -     VITAMIN D 25 Hydroxy (Vit-D Deficiency, Fractures)  Prediabetes -     Glucose (CBG) -     HgB A1c  Vaginal discharge -     Cervicovaginal ancillary only -     POCT URINALYSIS DIP (CLINITEK)  Encounter for screening for HIV -     HIV antibody (with reflex)  Low hemoglobin -     CBC -     ferrous sulfate 325 (65 FE) MG tablet; Take 1 tablet (325 mg total) by mouth 2 (two) times daily with a meal.  Mixed hyperlipidemia -     Lipid panel INSTRUCTIONS: Work on a low fat, heart healthy diet and participate in regular aerobic exercise program by working out at least 150 minutes per week; 5 days a week-30 minutes per day. Avoid red meat/beef/steak,  fried foods. junk foods, sodas, sugary drinks, unhealthy snacking, alcohol and smoking.  Drink at least 80 oz of water per day and monitor your carbohydrate intake daily.    Polysubstance abuse (HCC) -     folic acid (FOLVITE) 1 MG tablet; Take 1 tablet (1 mg total) by mouth daily. -     thiamine 100 MG tablet; Take 1 tablet (100 mg total) by mouth daily.  Anxiety and depression -     sertraline (ZOLOFT) 50 MG tablet; Take 1 tablet (50 mg total) by mouth daily.  Chronic combined systolic and diastolic heart failure (HCC) -     spironolactone (ALDACTONE) 25 MG tablet; Take 1 tablet (25 mg total) by mouth daily. F/U with Cardiology for additional refills DASH DIET Stop smoking  Patient has been counseled on age-appropriate routine health concerns for screening and prevention. These are reviewed and up-to-date. Referrals have been placed accordingly. Immunizations are  up-to-date or declined.    Subjective:   Chief Complaint  Patient presents with  . Follow-up    Pt. is here for follow up. Pt. is having vaginal discharge.    HPI Sabrina Holland 49 y.o. female presents to office today for follow up.  has a past medical history of Atrial flutter ([place]) (07/03/2018), Breast cancer, stage 2 ([place]) (02/23/2011), Chronic combined systolic and diastolic CHF (congestive heart failure) ([place]), CKD (chronic kidney disease), stage II, Cocaine use, COPD (chronic obstructive pulmonary disease) ([place]), Homelessness, Hypertension, Polysubstance abuse ([place]), Poor social situation, and TIA (transient ischemic attack) (11/25/2011).  Vaginitis: Patient complains of an abnormal vaginal discharge for several days. .STI Risk: Possible STD exposureDischarge described as: copious.Other associated symptoms: none.  Prediabetes Well controlled with diet.  Lab Results  Component Value Date   HGBA1C 5.3 07/29/2019   Essential Hypertension Well controlled. Taking coreg 6.25 mg BID, entresto 97-103 mg BID, spirinolactone 25 mg daily. She has chronic class II CHF (15%) and is being followed by Cardiology. She takes Eliquis and amiodarone for PAfib. Also taking digoxin 125 mg daily. She has been a no show for her most recent cardiology appt and ECHO. She has not completely stopped using illicit substances. Still awaiting possible ICD placement as she is s/p recent vfib arrest last month (6-19). Still smoking but wants to  quit.  BP Readings from Last 3 Encounters:  07/29/19 135/89  02/13/19 116/82  01/31/19 136/86         Review of Systems  Constitutional: Negative for fever, malaise/fatigue and weight loss.  HENT: Negative.  Negative for nosebleeds.   Eyes: Negative.  Negative for blurred vision, double vision and photophobia.  Respiratory: Negative.  Negative for cough and shortness of breath.   Cardiovascular: Negative.  Negative for chest pain, palpitations and leg  swelling.  Gastrointestinal: Negative.  Negative for heartburn, nausea and vomiting.  Genitourinary:       SEE HPI  Musculoskeletal: Negative.  Negative for myalgias.  Neurological: Negative.  Negative for dizziness, focal weakness, seizures and headaches.  Psychiatric/Behavioral: Positive for depression and substance abuse. Negative for suicidal ideas. The patient is nervous/anxious.     Past Medical History:  Diagnosis Date  . Atrial flutter ([place]) 07/03/2018  . Breast cancer, stage 2 ([place]) 02/23/2011  . Chronic combined systolic and diastolic CHF (congestive heart failure) ([place])   . CKD (chronic kidney disease), stage II   . Cocaine use   . COPD (chronic obstructive pulmonary disease) ([place])   . Homelessness   . Hypertension   . Polysubstance abuse ([place])   . Poor social situation   . TIA (transient ischemic attack) 11/25/2011   "first time" (11/25/2011)    Past Surgical History:  Procedure Laterality Date  . BREAST BIOPSY  2007   left  . MASTECTOMY  09/2005   left  . RIGHT/LEFT HEART CATH AND CORONARY ANGIOGRAPHY N/A 07/10/2018   Procedure: RIGHT/LEFT HEART CATH AND CORONARY ANGIOGRAPHY;  Surgeon: Jolaine Artist, MD;  Location: [facility];  Service: Cardiovascular;  Laterality: N/A;  . TONSILLECTOMY AND ADENOIDECTOMY     "when I was little" (11/25/2011)  . TUBAL LIGATION  1993    Family History  Problem Relation Age of Onset  . COPD Father   . Sickle cell anemia Cousin        maternal cousin    Social History Reviewed with no changes to be made today.   Outpatient Medications Prior to Visit  Medication Sig Dispense Refill  . albuterol (VENTOLIN HFA) 108 (90 Base) MCG/ACT inhaler Inhale 2 puffs into the lungs every 6 (six) hours as needed for wheezing or shortness of breath (cough). Will pick up scripts tomorrow 7-9. 18 g 1  . amiodarone (PACERONE) 200 MG tablet Take 1 tablet (200 mg total) by mouth daily. 60 tablet 3  . carvedilol (COREG) 6.25 MG tablet Take  1 tablet (6.25 mg total) by mouth 2 (two) times daily. 180 tablet 3  . digoxin (LANOXIN) 0.125 MG tablet Take 1 tablet (125 mcg total) by mouth daily. Needs appt for further refills 30 tablet 2  . ELIQUIS 5 MG TABS tablet TAKE 1 TABLET BY MOUTH TWICE DAILY 180 tablet 3  . fluticasone (FLONASE) 50 MCG/ACT nasal spray Place 2 sprays into both nostrils daily. 16 g 0  . sacubitril-valsartan (ENTRESTO) 97-103 MG Take 1 tablet by mouth 2 (two) times daily. 60 tablet 11  . ferrous sulfate 325 (65 FE) MG tablet Take 1 tablet (325 mg total) by mouth 2 (two) times daily with a meal. 60 tablet 0  . folic acid (FOLVITE) 1 MG tablet Take 1 tablet (1 mg total) by mouth daily. 30 tablet 2  . sertraline (ZOLOFT) 25 MG tablet Take 2 tablets (50 mg total) by mouth daily. Please make PCP appt. 60 tablet 0  . spironolactone (ALDACTONE)  25 MG tablet Take 1 tablet (25 mg total) by mouth daily. 30 tablet 6  . thiamine 100 MG tablet Take 1 tablet (100 mg total) by mouth daily. (Patient not taking: Reported on 07/29/2019) 30 tablet 0   No facility-administered medications prior to visit.    Allergies  Allergen Reactions  . Penicillins Other (See Comments)    Pt had slight throat swelling Has patient had a PCN reaction causing immediate rash, facial/tongue/throat swelling, SOB or lightheadedness with hypotension: yes Has patient had a PCN reaction causing severe rash involving mucus membranes or skin necrosis: No Has patient had a PCN reaction that required hospitalization No Has patient had a PCN reaction occurring within the last 10 years: No If all of the above answers are "NO", then may proceed with Cephalosporin use.        Objective:    BP 135/89 (BP Location: Right Arm, Patient Position: Sitting, Cuff Size: Normal)   Pulse 75   Temp 97.7 F (36.5 C) (Temporal)   Wt 121 lb (54.9 kg)   LMP 02/18/2015 (Exact Date) Comment: MAYBE ONCE A YEAR  SpO2 95%   BMI 20.14 kg/m  Wt Readings from Last 3  Encounters:  07/29/19 121 lb (54.9 kg)  02/13/19 132 lb (59.9 kg)  01/31/19 128 lb 14.2 oz (58.5 kg)    Physical Exam Vitals and nursing note reviewed.  Constitutional:      Appearance: She is underweight.  HENT:     Head: Normocephalic and atraumatic.  Cardiovascular:     Rate and Rhythm: Normal rate and regular rhythm.     Heart sounds: Normal heart sounds. No murmur heard.  No friction rub. No gallop.   Pulmonary:     Effort: Pulmonary effort is normal. No tachypnea or respiratory distress.     Breath sounds: Normal breath sounds. No decreased breath sounds, wheezing, rhonchi or rales.  Chest:     Chest wall: No tenderness.  Abdominal:     General: Bowel sounds are normal.     Palpations: Abdomen is soft.  Musculoskeletal:        General: Normal range of motion.     Cervical back: Normal range of motion.  Skin:    General: Skin is warm and dry.  Neurological:     Mental Status: She is alert and oriented to person, place, and time.     Coordination: Coordination normal.  Psychiatric:        Behavior: Behavior normal. Behavior is cooperative.        Thought Content: Thought content normal.        Judgment: Judgment normal.          Patient has been counseled extensively about nutrition and exercise as well as the importance of adherence with medications and regular follow-up. The patient was given clear instructions to go to ER or return to medical center if symptoms don't improve, worsen or new problems develop. The patient verbalized understanding.   Follow-up: Return in about 3 months (around 10/29/2019).   Gildardo Pounds, FNP-BC [facility] [place], [place]   08/01/2019, 12:25 AM

## 2019-07-30 ENCOUNTER — Other Ambulatory Visit: Payer: Self-pay | Admitting: Nurse Practitioner

## 2019-07-30 LAB — CBC
Hematocrit: 38.7 % (ref 34.0–46.6)
Hemoglobin: 12.7 g/dL (ref 11.1–15.9)
MCH: 29.5 pg (ref 26.6–33.0)
MCHC: 32.8 g/dL (ref 31.5–35.7)
MCV: 90 fL (ref 79–97)
Platelets: 191 10*3/uL (ref 150–450)
RBC: 4.31 x10E6/uL (ref 3.77–5.28)
RDW: 12.4 % (ref 11.7–15.4)
WBC: 6.6 10*3/uL (ref 3.4–10.8)

## 2019-07-30 LAB — LIPID PANEL
Chol/HDL Ratio: 1.8 ratio (ref 0.0–4.4)
Cholesterol, Total: 222 mg/dL — ABNORMAL HIGH (ref 100–199)
HDL: 123 mg/dL (ref >39)
LDL Chol Calc (NIH): 86 mg/dL (ref 0–99)
Triglycerides: 75 mg/dL (ref 0–149)
VLDL Cholesterol Cal: 13 mg/dL (ref 5–40)

## 2019-07-30 LAB — CERVICOVAGINAL ANCILLARY ONLY
Bacterial Vaginitis (gardnerella): POSITIVE — AB
Candida Glabrata: NEGATIVE
Candida Vaginitis: POSITIVE — AB
Chlamydia: NEGATIVE
Comment: NEGATIVE
Comment: NEGATIVE
Comment: NEGATIVE
Comment: NEGATIVE
Comment: NEGATIVE
Comment: NORMAL
Neisseria Gonorrhea: NEGATIVE
Trichomonas: POSITIVE — AB

## 2019-07-30 LAB — CMP14+EGFR
ALT: 15 IU/L (ref 0–32)
AST: 28 IU/L (ref 0–40)
Albumin/Globulin Ratio: 1.8 (ref 1.2–2.2)
Albumin: 4.7 g/dL (ref 3.8–4.9)
Alkaline Phosphatase: 82 IU/L (ref 48–121)
BUN/Creatinine Ratio: 13 (ref 9–23)
BUN: 12 mg/dL (ref 6–24)
Bilirubin Total: 0.4 mg/dL (ref 0.0–1.2)
CO2: 24 mmol/L (ref 20–29)
Calcium: 9.7 mg/dL (ref 8.7–10.2)
Chloride: 103 mmol/L (ref 96–106)
Creatinine, Ser: 0.93 mg/dL (ref 0.57–1.00)
GFR calc Af Amer: 82 mL/min/{1.73_m2} (ref >59)
GFR calc non Af Amer: 71 mL/min/{1.73_m2} (ref >59)
Globulin, Total: 2.6 g/dL (ref 1.5–4.5)
Glucose: 77 mg/dL (ref 65–99)
Potassium: 4.5 mmol/L (ref 3.5–5.2)
Sodium: 141 mmol/L (ref 134–144)
Total Protein: 7.3 g/dL (ref 6.0–8.5)

## 2019-07-30 LAB — VITAMIN D 25 HYDROXY (VIT D DEFICIENCY, FRACTURES): Vit D, 25-Hydroxy: 16.3 ng/mL — ABNORMAL LOW (ref 30.0–100.0)

## 2019-07-30 LAB — HIV ANTIBODY (ROUTINE TESTING W REFLEX): HIV Screen 4th Generation wRfx: NONREACTIVE

## 2019-07-30 MED ORDER — FLUCONAZOLE 150 MG PO TABS: 150.0000 mg | ORAL_TABLET | Freq: Once | ORAL | 0 refills | Status: AC

## 2019-07-30 MED ORDER — VITAMIN D (ERGOCALCIFEROL) 1.25 MG (50000 UNIT) PO CAPS: 50000.0000 [IU] | ORAL_CAPSULE | ORAL | 1 refills | Status: DC

## 2019-07-30 MED ORDER — METRONIDAZOLE 500 MG PO TABS: 500.0000 mg | ORAL_TABLET | Freq: Two times a day (BID) | ORAL | 0 refills | Status: AC

## 2019-07-31 ENCOUNTER — Telehealth: Payer: Self-pay | Admitting: Nurse Practitioner

## 2019-07-31 NOTE — Telephone Encounter (Signed)
Please f/u   Copied from [place] [phone]. Topic: General - Other >> Jul 31, 2019 10:40 AM Alanda Slim E wrote: Reason for CRM: Pt would like a call from [place] to go over her lab results and asked what kind of HIV test was performed/ please advise

## 2019-07-31 NOTE — Telephone Encounter (Signed)
Spoke to patient and informed on results. Pt. Understood.  

## 2019-08-01 ENCOUNTER — Encounter: Payer: Self-pay | Admitting: Nurse Practitioner

## 2019-08-06 ENCOUNTER — Telehealth: Payer: Self-pay | Admitting: Hospice

## 2019-08-06 NOTE — Telephone Encounter (Signed)
Left voicemail for patient regarding scheduling [facility] Palliative visit as provider will be out of office.

## 2019-08-06 NOTE — Telephone Encounter (Signed)
Scheduled [facility] Palliative visit for 09-11-19 at 1:00.

## 2019-08-16 ENCOUNTER — Other Ambulatory Visit (HOSPITAL_COMMUNITY): Payer: Self-pay | Admitting: Adult Health

## 2019-09-11 ENCOUNTER — Other Ambulatory Visit: Payer: Self-pay

## 2019-09-11 ENCOUNTER — Other Ambulatory Visit: Payer: Medicaid Other | Admitting: Hospice

## 2019-09-11 DIAGNOSIS — Z515 Encounter for palliative care: Secondary | ICD-10-CM

## 2019-09-11 NOTE — Progress Notes (Signed)
THIS VISIT DID NOT HOLD AS SCHEDULED. CALLED PATIENT SEVERAL TIMES AND LEFT VOICEMAIL WITH CALL BACK NUMBER.

## 2019-09-16 ENCOUNTER — Encounter (HOSPITAL_COMMUNITY): Payer: Self-pay | Admitting: Emergency Medicine

## 2019-09-16 ENCOUNTER — Ambulatory Visit (HOSPITAL_COMMUNITY)
Admission: EM | Admit: 2019-09-16 | Discharge: 2019-09-16 | Disposition: A | Discharge status: Home / Self Care | Payer: Medicaid Other | Attending: Family Medicine | Admitting: Family Medicine

## 2019-09-16 ENCOUNTER — Other Ambulatory Visit: Payer: Self-pay

## 2019-09-16 DIAGNOSIS — Z20822 Contact with and (suspected) exposure to covid-19: Secondary | ICD-10-CM | POA: Diagnosis not present

## 2019-09-16 DIAGNOSIS — Z79899 Other long term (current) drug therapy: Secondary | ICD-10-CM | POA: Insufficient documentation

## 2019-09-16 DIAGNOSIS — F17210 Nicotine dependence, cigarettes, uncomplicated: Secondary | ICD-10-CM | POA: Diagnosis not present

## 2019-09-16 DIAGNOSIS — Z7901 Long term (current) use of anticoagulants: Secondary | ICD-10-CM | POA: Insufficient documentation

## 2019-09-16 DIAGNOSIS — I130 Hypertensive heart and chronic kidney disease with heart failure and stage 1 through stage 4 chronic kidney disease, or unspecified chronic kidney disease: Secondary | ICD-10-CM | POA: Insufficient documentation

## 2019-09-16 DIAGNOSIS — R0602 Shortness of breath: Secondary | ICD-10-CM | POA: Insufficient documentation

## 2019-09-16 DIAGNOSIS — J449 Chronic obstructive pulmonary disease, unspecified: Secondary | ICD-10-CM

## 2019-09-16 DIAGNOSIS — I10 Essential (primary) hypertension: Secondary | ICD-10-CM

## 2019-09-16 DIAGNOSIS — I509 Heart failure, unspecified: Secondary | ICD-10-CM | POA: Diagnosis not present

## 2019-09-16 DIAGNOSIS — I4892 Unspecified atrial flutter: Secondary | ICD-10-CM | POA: Diagnosis not present

## 2019-09-16 DIAGNOSIS — N182 Chronic kidney disease, stage 2 (mild): Secondary | ICD-10-CM | POA: Diagnosis not present

## 2019-09-16 DIAGNOSIS — I429 Cardiomyopathy, unspecified: Secondary | ICD-10-CM | POA: Insufficient documentation

## 2019-09-16 DIAGNOSIS — I5043 Acute on chronic combined systolic (congestive) and diastolic (congestive) heart failure: Secondary | ICD-10-CM | POA: Diagnosis not present

## 2019-09-16 DIAGNOSIS — J441 Chronic obstructive pulmonary disease with (acute) exacerbation: Secondary | ICD-10-CM | POA: Diagnosis not present

## 2019-09-16 DIAGNOSIS — E782 Mixed hyperlipidemia: Secondary | ICD-10-CM | POA: Insufficient documentation

## 2019-09-16 DIAGNOSIS — Z8673 Personal history of transient ischemic attack (TIA), and cerebral infarction without residual deficits: Secondary | ICD-10-CM | POA: Diagnosis not present

## 2019-09-16 MED ORDER — ALBUTEROL SULFATE HFA 108 (90 BASE) MCG/ACT IN AERS: 1.0000 | INHALATION_SPRAY | Freq: Four times a day (QID) | RESPIRATORY_TRACT | 0 refills | Status: DC | PRN

## 2019-09-16 NOTE — Discharge Instructions (Addendum)
Take the lasix you have at home for as needed use, and I sent over a new albuterol inhaler for you. Await COVID test results and isolate at home until these come back.

## 2019-09-16 NOTE — ED Notes (Signed)
Sabrina Holland, patient access, requested patient be assessed at check in to department

## 2019-09-16 NOTE — ED Provider Notes (Signed)
[place]    CSN: [id number] Arrival date & time: 09/16/19  1906      History   Chief Complaint Chief Complaint  Patient presents with   Shortness of Breath    HPI Sabrina Holland is a 49 y.o. female.   Here today for several days of SOB, congestion, scratchy throat, chills, and cough. Denies fever, CP, wheezing, N/V/D. Has had lots of public exposures, unsure if anyone she was around is positive for COVID 19. Is not vaccinated against COVID 19. Of note, hx of CHF, COPD not consistently taking her diuretics and not on any inhalers at this time. States she has spironolactone she's to take daily but unsure if she's been doing that, lasix for prn but has not thought to use it.      Past Medical History:  Diagnosis Date   Atrial flutter ([place]) 07/03/2018   Breast cancer, stage 2 ([place]) 02/23/2011   Chronic combined systolic and diastolic CHF (congestive heart failure) (HCC)    CKD (chronic kidney disease), stage II    Cocaine use    COPD (chronic obstructive pulmonary disease) ([place])    Homelessness    Hypertension    Polysubstance abuse ([place])    Poor social situation    TIA (transient ischemic attack) 11/25/2011   "first time" (11/25/2011)    Patient Active Problem List   Diagnosis Date Noted   Palliative care by specialist    Goals of care, counseling/discussion    Acute respiratory failure ([place])    Endotracheally intubated    Torsades de pointes ([place])    Cardiac arrest ([place])    Atrial flutter ([place]) 07/03/2018   Elevated LFTs 04/18/2018   Malnutrition of moderate degree 02/27/2018   Dyspnea 02/26/2018   Acute on chronic systolic and diastolic heart failure, NYHA class 3 ([place]) 01/20/2018   Cocaine abuse ([place]) 12/19/2017   Mixed hyperlipidemia 12/19/2017   COPD exacerbation ([place])    Acute on chronic respiratory failure with hypoxemia ([place]) 12/03/2017   Cardiomyopathy ([place]) 07/04/2017   COPD (chronic obstructive pulmonary  disease) ([place])    Chronic combined systolic and diastolic heart failure ([place])    Anemia 06/20/2017   Polysubstance abuse ([place]) 11/26/2011   TIA (transient ischemic attack) 11/25/2011   Essential hypertension 11/25/2011   Sinusitis 11/25/2011   Vitamin B 12 deficiency 07/18/2011   History of breast cancer 02/23/2011    Past Surgical History:  Procedure Laterality Date   BREAST BIOPSY  2007   left   MASTECTOMY  09/2005   left   RIGHT/LEFT HEART CATH AND CORONARY ANGIOGRAPHY N/A 07/10/2018   Procedure: RIGHT/LEFT HEART CATH AND CORONARY ANGIOGRAPHY;  Surgeon: Jolaine Artist, MD;  Location: [facility];  Service: Cardiovascular;  Laterality: N/A;   TONSILLECTOMY AND ADENOIDECTOMY     "when I was little" (11/25/2011)   TUBAL LIGATION  1993    OB History    Gravida  5   Para  4   Term  4   Preterm  0   AB  1   Living        SAB  1   TAB  0   Ectopic  0   Multiple      Live Births               Home Medications    Prior to Admission medications   Medication Sig Start Date End Date Taking? Authorizing Provider  albuterol (VENTOLIN HFA) 108 (90 Base) MCG/ACT  inhaler Inhale 1-2 puffs into the lungs every 6 (six) hours as needed for wheezing or shortness of breath (cough). Will pick up scripts tomorrow 7-9. 09/16/19   Volney American, PA-C  amiodarone (PACERONE) 200 MG tablet TAKE ONE TABLET BY MOUTH ONCE DAILY 08/19/19   Bensimhon, Shaune Pascal, MD  carvedilol (COREG) 6.25 MG tablet Take 1 tablet (6.25 mg total) by mouth 2 (two) times daily. 01/31/19   Bensimhon, Shaune Pascal, MD  digoxin (LANOXIN) 0.125 MG tablet Take 1 tablet (125 mcg total) by mouth daily. Needs appt for further refills 06/13/19   Bensimhon, Shaune Pascal, MD  ELIQUIS 5 MG TABS tablet TAKE 1 TABLET BY MOUTH TWICE DAILY 07/23/19   Larey Dresser, MD  ferrous sulfate 325 (65 FE) MG tablet Take 1 tablet (325 mg total) by mouth 2 (two) times daily with a meal. 07/29/19   Gildardo Pounds,  NP  fluticasone (FLONASE) 50 MCG/ACT nasal spray Place 2 sprays into both nostrils daily. 12/05/17   Eugenie Filler, MD  folic acid (FOLVITE) 1 MG tablet Take 1 tablet (1 mg total) by mouth daily. 07/29/19   Gildardo Pounds, NP  sacubitril-valsartan (ENTRESTO) 97-103 MG Take 1 tablet by mouth 2 (two) times daily. 01/02/19   Bensimhon, Shaune Pascal, MD  sertraline (ZOLOFT) 50 MG tablet Take 1 tablet (50 mg total) by mouth daily. 07/29/19 10/27/19  Gildardo Pounds, NP  spironolactone (ALDACTONE) 25 MG tablet Take 1 tablet (25 mg total) by mouth daily. F/U with Cardiology for additional refills 07/29/19 10/27/19  Gildardo Pounds, NP  thiamine 100 MG tablet Take 1 tablet (100 mg total) by mouth daily. 07/29/19   Gildardo Pounds, NP  Vitamin D, Ergocalciferol, (DRISDOL) 1.25 MG (50000 UNIT) CAPS capsule Take 1 capsule (50,000 Units total) by mouth every 7 (seven) days. 07/30/19   Gildardo Pounds, NP    Family History Family History  Problem Relation Age of Onset   COPD Father    Sickle cell anemia Cousin        maternal cousin    Social History Social History   Tobacco Use   Smoking status: Current Every Day Smoker    Packs/day: 5.00    Years: 30.00    Pack years: 150.00    Types: Cigarettes   Smokeless tobacco: Never Used   Tobacco comment: 1 or 2 cigarettes a day- approx 1 pack week  Vaping Use   Vaping Use: Never used  Substance Use Topics   Alcohol use: Yes    Alcohol/week: 12.0 standard drinks    Types: 12 Cans of beer per week    Comment: last drink was 5/19   Drug use: Yes    Types: Marijuana, Cocaine    Comment: daily marijuana; last use of cocaine 05/02/18 but selling it     Allergies   Penicillins   Review of Systems Review of Systems  Constitutional: Positive for chills.  HENT: Positive for rhinorrhea and sore throat.   Eyes: Negative.   Respiratory: Positive for cough and shortness of breath.   Cardiovascular: Negative.  Negative for leg swelling.    Gastrointestinal: Negative.   Genitourinary: Negative.   Musculoskeletal: Negative.   Skin: Negative.   Neurological: Negative.   Psychiatric/Behavioral: Negative.      Physical Exam Triage Vital Signs ED Triage Vitals  Enc Vitals Group     BP 09/16/19 1912 (!) 171/108     Pulse Rate 09/16/19 1912 (!) 103  Resp 09/16/19 1912 (!) 26     Temp 09/16/19 1912 98.4 F (36.9 C)     Temp Source 09/16/19 1912 Oral     SpO2 09/16/19 1912 93 %     Weight --      Height --      Head Circumference --      Peak Flow --      Pain Score 09/16/19 1909 6     Pain Loc --      Pain Edu? --      Excl. in [place]? --    No data found.  Updated Vital Signs BP (!) 164/104 (BP Location: Right Arm) Comment: repositioned   Pulse (!) 103    Temp 98.4 F (36.9 C) (Oral)    Resp (!) 26    LMP 02/18/2015 (Exact Date) Comment: MAYBE ONCE A YEAR   SpO2 93%   Visual Acuity Right Eye Distance:   Left Eye Distance:   Bilateral Distance:    Right Eye Near:   Left Eye Near:    Bilateral Near:     Physical Exam Vitals and nursing note reviewed.  Constitutional:      Appearance: Normal appearance. She is not ill-appearing.  HENT:     Head: Atraumatic.     Nose: Nose normal. No congestion.     Mouth/Throat:     Mouth: Mucous membranes are moist.     Pharynx: Oropharynx is clear. Posterior oropharyngeal erythema present.  Eyes:     Extraocular Movements: Extraocular movements intact.     Conjunctiva/sclera: Conjunctivae normal.  Cardiovascular:     Rate and Rhythm: Normal rate and regular rhythm.     Heart sounds: Normal heart sounds.  Pulmonary:     Effort: Pulmonary effort is normal.     Comments: Breath sounds decreased b/l No obvious rales or wheezes throughout Abdominal:     General: Bowel sounds are normal. There is no distension.     Palpations: Abdomen is soft.     Tenderness: There is no abdominal tenderness. There is no guarding.  Musculoskeletal:        General: No swelling.  Normal range of motion.     Cervical back: Normal range of motion and neck supple.  Skin:    General: Skin is warm and dry.     Findings: No erythema.  Neurological:     Mental Status: She is alert and oriented to person, place, and time.  Psychiatric:        Mood and Affect: Mood normal.        Thought Content: Thought content normal.        Judgment: Judgment normal.      UC Treatments / Results  Labs (all labs ordered are listed, but only abnormal results are displayed) Labs Reviewed  SARS CORONAVIRUS 2 (TAT 6-24 HRS)    EKG   Radiology No results found.  Procedures Procedures (including critical care time)  Medications Ordered in UC Medications - No data to display  Initial Impression / Assessment and Plan / UC Course  I have reviewed the triage vital signs and the nursing notes.  Pertinent labs & imaging results that were available during my care of the patient were reviewed by me and considered in my medical decision making (see chart for details).     49 year old patient here with complicated medical history significant for COPD, CHF, polysubstance abuse, HTN, and hx of atrial flutter here with SOB - mutliple possible contributors -  possible poor compliance with inhalers and diuretics, encouraged use of diuretics including prn lasix she has at home, and re-sent albuterol inhaler as her's was outdated and she didn't know if she had it anymore. Will also await COVID results and isolate at home until back. Follow up with PCP this week particularly if not improving. Strict return precautions reviewed with patient.   Noted to have elevated blood pressures here today, discussed good compliance with home BP regimen and close PCP follow up.   Final Clinical Impressions(s) / UC Diagnoses   Final diagnoses:  Shortness of breath  COPD exacerbation (HCC)  Congestive heart failure, unspecified HF chronicity, unspecified heart failure type [facility])  Essential hypertension      Discharge Instructions     Take the lasix you have at home for as needed use, and I sent over a new albuterol inhaler for you. Await COVID test results and isolate at home until these come back.     ED Prescriptions    Medication Sig Dispense Auth. Provider   albuterol (VENTOLIN HFA) 108 (90 Base) MCG/ACT inhaler Inhale 1-2 puffs into the lungs every 6 (six) hours as needed for wheezing or shortness of breath (cough). Will pick up scripts tomorrow 7-9. 18 g Volney American, [place]     PDMP not reviewed this encounter.   Volney American, [place] 09/17/19 1055

## 2019-09-16 NOTE — ED Triage Notes (Signed)
Sob, scratchy throat, runny nose that started yesterday.  Patient is requesting covid testing

## 2019-09-17 LAB — SARS CORONAVIRUS 2 (TAT 6-24 HRS): SARS Coronavirus 2: NEGATIVE

## 2019-09-24 ENCOUNTER — Encounter (HOSPITAL_COMMUNITY): Payer: Self-pay | Admitting: Emergency Medicine

## 2019-09-24 ENCOUNTER — Emergency Department (HOSPITAL_COMMUNITY)
Admission: EM | Admit: 2019-09-24 | Discharge: 2019-09-24 | Disposition: A | Discharge status: Home / Self Care | Payer: Medicaid Other | Attending: Emergency Medicine | Admitting: Emergency Medicine

## 2019-09-24 ENCOUNTER — Emergency Department (HOSPITAL_COMMUNITY): Payer: Medicaid Other

## 2019-09-24 ENCOUNTER — Other Ambulatory Visit: Payer: Self-pay

## 2019-09-24 DIAGNOSIS — R0602 Shortness of breath: Secondary | ICD-10-CM | POA: Diagnosis not present

## 2019-09-24 DIAGNOSIS — I509 Heart failure, unspecified: Secondary | ICD-10-CM | POA: Insufficient documentation

## 2019-09-24 DIAGNOSIS — Z5321 Procedure and treatment not carried out due to patient leaving prior to being seen by health care provider: Secondary | ICD-10-CM | POA: Diagnosis not present

## 2019-09-24 LAB — I-STAT BETA HCG BLOOD, ED (MC, WL, AP ONLY): I-stat hCG, quantitative: 8.4 m[IU]/mL — ABNORMAL HIGH (ref <5)

## 2019-09-24 LAB — CBC
HCT: 47.2 % — ABNORMAL HIGH (ref 36.0–46.0)
Hemoglobin: 15 g/dL (ref 12.0–15.0)
MCH: 28.4 pg (ref 26.0–34.0)
MCHC: 31.8 g/dL (ref 30.0–36.0)
MCV: 89.4 fL (ref 80.0–100.0)
Platelets: 362 10*3/uL (ref 150–400)
RBC: 5.28 MIL/uL — ABNORMAL HIGH (ref 3.87–5.11)
RDW: 13.5 % (ref 11.5–15.5)
WBC: 6.3 10*3/uL (ref 4.0–10.5)
nRBC: 0 % (ref 0.0–0.2)

## 2019-09-24 LAB — BASIC METABOLIC PANEL
Anion gap: 10 (ref 5–15)
BUN: 17 mg/dL (ref 6–20)
CO2: 29 mmol/L (ref 22–32)
Calcium: 9.8 mg/dL (ref 8.9–10.3)
Chloride: 96 mmol/L — ABNORMAL LOW (ref 98–111)
Creatinine, Ser: 1.18 mg/dL — ABNORMAL HIGH (ref 0.44–1.00)
GFR calc Af Amer: >60 mL/min (ref >60)
GFR calc non Af Amer: 53 mL/min — ABNORMAL LOW (ref >60)
Glucose, Bld: 114 mg/dL — ABNORMAL HIGH (ref 70–99)
Potassium: 4.1 mmol/L (ref 3.5–5.1)
Sodium: 135 mmol/L (ref 135–145)

## 2019-09-24 LAB — TROPONIN I (HIGH SENSITIVITY): Troponin I (High Sensitivity): 7 ng/L (ref <18)

## 2019-09-24 NOTE — ED Notes (Signed)
Pt seen leaving ED, stickers left on chair and unable to locate patient when called.

## 2019-09-24 NOTE — ED Triage Notes (Signed)
Pt endorses SOB for a week. Hx of CHF. Reports using cocaine yesterday. Pt RA O2 sats 88%, placed on 2L.

## 2019-09-25 ENCOUNTER — Other Ambulatory Visit: Payer: Self-pay | Admitting: Nurse Practitioner

## 2019-09-25 ENCOUNTER — Telehealth: Payer: Self-pay | Admitting: Nurse Practitioner

## 2019-09-25 ENCOUNTER — Telehealth: Payer: Self-pay | Admitting: *Deleted

## 2019-09-25 DIAGNOSIS — I5043 Acute on chronic combined systolic (congestive) and diastolic (congestive) heart failure: Secondary | ICD-10-CM

## 2019-09-25 DIAGNOSIS — R0602 Shortness of breath: Secondary | ICD-10-CM

## 2019-09-25 DIAGNOSIS — J449 Chronic obstructive pulmonary disease, unspecified: Secondary | ICD-10-CM

## 2019-09-25 NOTE — Telephone Encounter (Signed)
Medication Refill - Medication: lasix, albuterol for machine  Has the patient contacted their pharmacy?yes (Agent: If no, request that the patient contact the pharmacy for the refill.) (Agent: If yes, when and what did the pharmacy advise?)Contact PCP  Preferred Pharmacy (with phone number or street name):  [facility], [place] Phone:  [phone]  Fax:  [phone]       Agent: Please be advised that RX refills may take up to 3 business days. We ask that you follow-up with your pharmacy.

## 2019-09-25 NOTE — Telephone Encounter (Signed)
Sabrina Holland presented to the ED and left before being seen by the provider on 09/24/19. The patient has been enrolled in an automated general discharge outreach program and 2 attempts to contact the patient will be made to follow up on their ED visit and subsequent needs. The care management team is available to provide assistance to this patient at any time.   Order placed for [facility] Coordination due to 2 ED visits in 2 weeks.  Lenor Coffin, RN, BSN, [place] Patient [place] [phone]

## 2019-09-25 NOTE — Telephone Encounter (Signed)
Requested medication (s) are due for refill today: Yes  Requested medication (s) are on the active medication list: No  Last refill:  2020  Future visit scheduled: No  Notes to clinic:  Unable to refill per protocol, meds not on profile, discontinued at discharge from hospital     Requested Prescriptions  Pending Prescriptions Disp Refills   albuterol (PROVENTIL) (2.5 MG/3ML) 0.083% nebulizer solution 150 mL 1    Sig: Take 3 mLs (2.5 mg total) by nebulization every 6 (six) hours as needed for wheezing or shortness of breath.      Pulmonology:  Beta Agonists Failed - 09/25/2019 11:48 AM      Failed - One inhaler should last at least one month. If the patient is requesting refills earlier, contact the patient to check for uncontrolled symptoms.      Passed - Valid encounter within last 12 months    Recent Outpatient Visits           1 month ago Essential hypertension   [place], Vernia Buff, NP   1 year ago Vaginal discharge   [place], Deborah B, MD   1 year ago Hospital discharge follow-up   [place], Zelda W, NP   1 year ago Tooth abscess   [place], Vernia Buff, NP   1 year ago Essential hypertension   [place], NP                furosemide (LASIX) 20 MG tablet 60 tablet 0    Sig: Take 2 tablets (40 mg total) by mouth daily.      Cardiovascular:  Diuretics - Loop Failed - 09/25/2019 11:48 AM      Failed - Cr in normal range and within 360 days    Creatinine  Date Value Ref Range Status  11/28/2016 1.0 0.6 - 1.1 mg/dL Final   Creatinine, Ser  Date Value Ref Range Status  09/24/2019 1.18 (H) 0.44 - 1.00 mg/dL Final          Failed - Last BP in normal range    BP Readings from Last 1 Encounters:  09/24/19 (!) 138/102          Passed - K in normal range  and within 360 days    Potassium  Date Value Ref Range Status  09/24/2019 4.1 3.5 - 5.1 mmol/L Final  11/28/2016 4.3 3.5 - 5.1 mEq/L Final          Passed - Ca in normal range and within 360 days    Calcium  Date Value Ref Range Status  09/24/2019 9.8 8.9 - 10.3 mg/dL Final  11/28/2016 9.8 8.4 - 10.4 mg/dL Final   Calcium, Ion  Date Value Ref Range Status  07/10/2018 0.99 (L) 1.15 - 1.40 mmol/L Final          Passed - Na in normal range and within 360 days    Sodium  Date Value Ref Range Status  09/24/2019 135 135 - 145 mmol/L Final  07/29/2019 141 134 - 144 mmol/L Final  11/28/2016 140 136 - 145 mEq/L Final          Passed - Valid encounter within last 6 months    Recent Outpatient Visits           1 month ago Essential hypertension   [place], Vernia Buff, NP   1 year ago Vaginal discharge   [place], Deborah B, MD   1 year ago Hospital discharge follow-up   [place], Zelda W, NP   1 year ago Tooth abscess   [place], Zelda W, NP   1 year ago Essential hypertension   [place], Vernia Buff, NP

## 2019-09-25 NOTE — Telephone Encounter (Signed)
Please advise.   Copied from [place] [id number]. Topic: General - Inquiry >> Sep 24, 2019  5:21 PM Sabrina Holland wrote: Reason for CRM: pt called in and stated she went to ed today.  She stated that they told her to take lasix.  She thought she had some at home but she does not.  She stated she needs to know if dr can all some in for her ?  [place] number [phone]

## 2019-09-25 NOTE — Telephone Encounter (Signed)
Patient was instructed to stop lasix and losartan after starting entresto last year. I can not refill. She will need to follow up with Cardiology for this.

## 2019-09-25 NOTE — Telephone Encounter (Signed)
Please advise.  Copied from [place] [id number]. Topic: General - Other >> Sep 25, 2019 11:40 AM Rain[place] Pines A wrote: Patient is requesting a callback from her PCP today as soon as possible. Please advise

## 2019-09-26 NOTE — Telephone Encounter (Signed)
CMA attempt to reach patient to inform on PCP advising. No answer and LVM.

## 2019-09-26 NOTE — Telephone Encounter (Signed)
Attempt to reach patient to inform she need to follow up w/ Cardiologist. Cardiologist stopped her on Lasix and Losartan when they started her w/ Delene Loll.  No answer and LVM to call back the clinic.

## 2019-09-27 ENCOUNTER — Telehealth: Payer: Self-pay

## 2019-09-27 MED ORDER — ALBUTEROL SULFATE (2.5 MG/3ML) 0.083% IN NEBU: 2.5000 mg | INHALATION_SOLUTION | Freq: Four times a day (QID) | RESPIRATORY_TRACT | 1 refills | Status: DC | PRN

## 2019-09-27 NOTE — Telephone Encounter (Signed)
Follow up call placed to patient after volunteer check in. VM left for patient with call back information.

## 2019-09-30 ENCOUNTER — Telehealth: Payer: Self-pay | Admitting: *Deleted

## 2019-09-30 ENCOUNTER — Ambulatory Visit: Payer: Self-pay

## 2019-09-30 NOTE — Patient Outreach (Signed)
Care Coordination  09/30/2019  [place] [birth year] [phone]  An unsuccessful telephone outreach was attempted today. The patient was referred to the case management team for assistance with care management and care coordination.   Follow Up Plan: The Managed Medicaid care management team will reach out to the patient again over the next 7 days.  The patient has been provided with contact information for the Managed Medicaid care management team and has been advised to call with any health related questions or concerns.   Lurena Joiner RN, BSN [place] RN Care Coordinator

## 2019-09-30 NOTE — Patient Instructions (Signed)
Visit Information  Ms. [place]linger  - as a part of your Medicaid benefit, you are eligible for care management and care coordination services. I was unable to reach you by phone today but would be happy to help you with your health related needs. Please feel free to call me [phone]  The Managed Medicaid care management team will reach out to the patient again over the next 7 days.   Lurena Joiner RN, BSN [place] RN Care Coordinator

## 2019-10-03 ENCOUNTER — Emergency Department (HOSPITAL_COMMUNITY): Payer: Medicaid Other

## 2019-10-03 ENCOUNTER — Inpatient Hospital Stay (HOSPITAL_COMMUNITY)
Admission: EM | Admit: 2019-10-03 | Discharge: 2019-10-04 | DRG: 189 | Disposition: A | Discharge status: Home / Self Care | Payer: Medicaid Other | Attending: Internal Medicine | Admitting: Internal Medicine

## 2019-10-03 DIAGNOSIS — Z20822 Contact with and (suspected) exposure to covid-19: Secondary | ICD-10-CM | POA: Diagnosis present

## 2019-10-03 DIAGNOSIS — I10 Essential (primary) hypertension: Secondary | ICD-10-CM | POA: Diagnosis not present

## 2019-10-03 DIAGNOSIS — F1210 Cannabis abuse, uncomplicated: Secondary | ICD-10-CM | POA: Diagnosis present

## 2019-10-03 DIAGNOSIS — R0902 Hypoxemia: Secondary | ICD-10-CM | POA: Diagnosis not present

## 2019-10-03 DIAGNOSIS — Z79899 Other long term (current) drug therapy: Secondary | ICD-10-CM | POA: Diagnosis not present

## 2019-10-03 DIAGNOSIS — I130 Hypertensive heart and chronic kidney disease with heart failure and stage 1 through stage 4 chronic kidney disease, or unspecified chronic kidney disease: Secondary | ICD-10-CM | POA: Diagnosis present

## 2019-10-03 DIAGNOSIS — I110 Hypertensive heart disease with heart failure: Secondary | ICD-10-CM | POA: Diagnosis not present

## 2019-10-03 DIAGNOSIS — Z7901 Long term (current) use of anticoagulants: Secondary | ICD-10-CM | POA: Diagnosis not present

## 2019-10-03 DIAGNOSIS — I509 Heart failure, unspecified: Secondary | ICD-10-CM | POA: Diagnosis not present

## 2019-10-03 DIAGNOSIS — I480 Paroxysmal atrial fibrillation: Secondary | ICD-10-CM | POA: Diagnosis present

## 2019-10-03 DIAGNOSIS — J9601 Acute respiratory failure with hypoxia: Principal | ICD-10-CM | POA: Diagnosis present

## 2019-10-03 DIAGNOSIS — N182 Chronic kidney disease, stage 2 (mild): Secondary | ICD-10-CM | POA: Diagnosis present

## 2019-10-03 DIAGNOSIS — Z853 Personal history of malignant neoplasm of breast: Secondary | ICD-10-CM

## 2019-10-03 DIAGNOSIS — Z825 Family history of asthma and other chronic lower respiratory diseases: Secondary | ICD-10-CM

## 2019-10-03 DIAGNOSIS — Z880 Allergy status to penicillin: Secondary | ICD-10-CM

## 2019-10-03 DIAGNOSIS — Z832 Family history of diseases of the blood and blood-forming organs and certain disorders involving the immune mechanism: Secondary | ICD-10-CM

## 2019-10-03 DIAGNOSIS — E875 Hyperkalemia: Secondary | ICD-10-CM | POA: Diagnosis present

## 2019-10-03 DIAGNOSIS — J441 Chronic obstructive pulmonary disease with (acute) exacerbation: Secondary | ICD-10-CM | POA: Diagnosis present

## 2019-10-03 DIAGNOSIS — R0602 Shortness of breath: Secondary | ICD-10-CM | POA: Diagnosis not present

## 2019-10-03 DIAGNOSIS — F418 Other specified anxiety disorders: Secondary | ICD-10-CM | POA: Diagnosis present

## 2019-10-03 DIAGNOSIS — R069 Unspecified abnormalities of breathing: Secondary | ICD-10-CM | POA: Diagnosis not present

## 2019-10-03 DIAGNOSIS — J439 Emphysema, unspecified: Secondary | ICD-10-CM | POA: Diagnosis not present

## 2019-10-03 DIAGNOSIS — F17210 Nicotine dependence, cigarettes, uncomplicated: Secondary | ICD-10-CM | POA: Diagnosis present

## 2019-10-03 DIAGNOSIS — J9600 Acute respiratory failure, unspecified whether with hypoxia or hypercapnia: Secondary | ICD-10-CM | POA: Diagnosis not present

## 2019-10-03 DIAGNOSIS — F1410 Cocaine abuse, uncomplicated: Secondary | ICD-10-CM | POA: Diagnosis present

## 2019-10-03 DIAGNOSIS — Z8673 Personal history of transient ischemic attack (TIA), and cerebral infarction without residual deficits: Secondary | ICD-10-CM | POA: Diagnosis not present

## 2019-10-03 DIAGNOSIS — Z590 Homelessness: Secondary | ICD-10-CM | POA: Diagnosis not present

## 2019-10-03 DIAGNOSIS — I5042 Chronic combined systolic (congestive) and diastolic (congestive) heart failure: Secondary | ICD-10-CM | POA: Diagnosis present

## 2019-10-03 DIAGNOSIS — Z9012 Acquired absence of left breast and nipple: Secondary | ICD-10-CM | POA: Diagnosis not present

## 2019-10-03 DIAGNOSIS — I959 Hypotension, unspecified: Secondary | ICD-10-CM | POA: Diagnosis not present

## 2019-10-03 LAB — BASIC METABOLIC PANEL
Anion gap: 8 (ref 5–15)
Anion gap: 12 (ref 5–15)
BUN: 13 mg/dL (ref 6–20)
BUN: 19 mg/dL (ref 6–20)
CO2: 27 mmol/L (ref 22–32)
CO2: 27 mmol/L (ref 22–32)
Calcium: 9.6 mg/dL (ref 8.9–10.3)
Calcium: 9.8 mg/dL (ref 8.9–10.3)
Chloride: 103 mmol/L (ref 98–111)
Chloride: 97 mmol/L — ABNORMAL LOW (ref 98–111)
Creatinine, Ser: 0.94 mg/dL (ref 0.44–1.00)
Creatinine, Ser: 1.1 mg/dL — ABNORMAL HIGH (ref 0.44–1.00)
GFR calc Af Amer: >60 mL/min (ref >60)
GFR calc Af Amer: >60 mL/min (ref >60)
GFR calc non Af Amer: >60 mL/min (ref >60)
GFR calc non Af Amer: 58 mL/min — ABNORMAL LOW (ref >60)
Glucose, Bld: 107 mg/dL — ABNORMAL HIGH (ref 70–99)
Glucose, Bld: 177 mg/dL — ABNORMAL HIGH (ref 70–99)
Potassium: 5.2 mmol/L — ABNORMAL HIGH (ref 3.5–5.1)
Potassium: 4.1 mmol/L (ref 3.5–5.1)
Sodium: 138 mmol/L (ref 135–145)
Sodium: 136 mmol/L (ref 135–145)

## 2019-10-03 LAB — CBC
HCT: 43.7 % (ref 36.0–46.0)
Hemoglobin: 13.7 g/dL (ref 12.0–15.0)
MCH: 28.7 pg (ref 26.0–34.0)
MCHC: 31.4 g/dL (ref 30.0–36.0)
MCV: 91.6 fL (ref 80.0–100.0)
Platelets: 325 10*3/uL (ref 150–400)
RBC: 4.77 MIL/uL (ref 3.87–5.11)
RDW: 14.3 % (ref 11.5–15.5)
WBC: 7.4 10*3/uL (ref 4.0–10.5)
nRBC: 0 % (ref 0.0–0.2)

## 2019-10-03 LAB — BLOOD GAS, VENOUS
Acid-Base Excess: 5 mmol/L — ABNORMAL HIGH (ref 0.0–2.0)
Bicarbonate: 30.5 mmol/L — ABNORMAL HIGH (ref 20.0–28.0)
Drawn by: 53304
FIO2: 28
O2 Saturation: 67.7 %
Patient temperature: 37
pCO2, Ven: 58.4 mmHg (ref 44.0–60.0)
pH, Ven: 7.338 (ref 7.250–7.430)
pO2, Ven: 37.9 mmHg (ref 32.0–45.0)

## 2019-10-03 LAB — URINALYSIS, ROUTINE W REFLEX MICROSCOPIC
Bilirubin Urine: NEGATIVE
Glucose, UA: NEGATIVE mg/dL
Hgb urine dipstick: NEGATIVE
Ketones, ur: NEGATIVE mg/dL
Leukocytes,Ua: NEGATIVE
Nitrite: NEGATIVE
Protein, ur: NEGATIVE mg/dL
Specific Gravity, Urine: 1.005 (ref 1.005–1.030)
pH: 5 (ref 5.0–8.0)

## 2019-10-03 LAB — TROPONIN I (HIGH SENSITIVITY)
Troponin I (High Sensitivity): 8 ng/L (ref <18)
Troponin I (High Sensitivity): 9 ng/L (ref <18)

## 2019-10-03 LAB — RAPID URINE DRUG SCREEN, HOSP PERFORMED
Amphetamines: NOT DETECTED
Barbiturates: NOT DETECTED
Benzodiazepines: NOT DETECTED
Cocaine: POSITIVE — AB
Opiates: POSITIVE — AB
Tetrahydrocannabinol: NOT DETECTED

## 2019-10-03 LAB — BRAIN NATRIURETIC PEPTIDE: B Natriuretic Peptide: 92.9 pg/mL (ref 0.0–100.0)

## 2019-10-03 LAB — DIGOXIN LEVEL: Digoxin Level: 0.3 ng/mL — ABNORMAL LOW (ref 0.8–2.0)

## 2019-10-03 LAB — SARS CORONAVIRUS 2 BY RT PCR (HOSPITAL ORDER, PERFORMED IN ~~LOC~~ HOSPITAL LAB): SARS Coronavirus 2: NEGATIVE

## 2019-10-03 MED ORDER — ENOXAPARIN SODIUM 40 MG/0.4ML ~~LOC~~ SOLN: 40.0000 mg | SUBCUTANEOUS | Status: DC

## 2019-10-03 MED ORDER — FERROUS SULFATE 325 (65 FE) MG PO TABS
325.0000 mg | ORAL_TABLET | Freq: Two times a day (BID) | ORAL | Status: DC
  Administered 2019-10-03 – 2019-10-04 (×2): 325 mg via ORAL
  Filled 2019-10-03 (×2): qty 1

## 2019-10-03 MED ORDER — ENALAPRILAT 1.25 MG/ML IV SOLN
1.2500 mg | Freq: Once | INTRAVENOUS | Status: DC
  Filled 2019-10-03: qty 1

## 2019-10-03 MED ORDER — CARVEDILOL 6.25 MG PO TABS
6.2500 mg | ORAL_TABLET | Freq: Two times a day (BID) | ORAL | Status: DC
  Administered 2019-10-03 – 2019-10-04 (×3): 6.25 mg via ORAL
  Filled 2019-10-03 (×3): qty 1

## 2019-10-03 MED ORDER — FUROSEMIDE 10 MG/ML IJ SOLN
40.0000 mg | Freq: Once | INTRAMUSCULAR | Status: AC
  Administered 2019-10-03: 40 mg via INTRAVENOUS
  Filled 2019-10-03: qty 4

## 2019-10-03 MED ORDER — SPIRONOLACTONE 25 MG PO TABS
25.0000 mg | ORAL_TABLET | Freq: Every day | ORAL | Status: DC
  Administered 2019-10-03 – 2019-10-04 (×2): 25 mg via ORAL
  Filled 2019-10-03 (×2): qty 1

## 2019-10-03 MED ORDER — FOLIC ACID 1 MG PO TABS
1.0000 mg | ORAL_TABLET | Freq: Every day | ORAL | Status: DC
  Administered 2019-10-03 – 2019-10-04 (×2): 1 mg via ORAL
  Filled 2019-10-03 (×2): qty 1

## 2019-10-03 MED ORDER — IPRATROPIUM BROMIDE HFA 17 MCG/ACT IN AERS
2.0000 | INHALATION_SPRAY | Freq: Once | RESPIRATORY_TRACT | Status: AC
  Administered 2019-10-03: 2 via RESPIRATORY_TRACT
  Filled 2019-10-03: qty 12.9

## 2019-10-03 MED ORDER — MORPHINE SULFATE (PF) 4 MG/ML IV SOLN
4.0000 mg | Freq: Once | INTRAVENOUS | Status: AC
  Administered 2019-10-03: 4 mg via INTRAVENOUS
  Filled 2019-10-03: qty 1

## 2019-10-03 MED ORDER — ALBUTEROL SULFATE (2.5 MG/3ML) 0.083% IN NEBU
3.0000 mL | INHALATION_SOLUTION | Freq: Four times a day (QID) | RESPIRATORY_TRACT | Status: DC
  Administered 2019-10-03 – 2019-10-04 (×4): 3 mL via RESPIRATORY_TRACT
  Filled 2019-10-03 (×4): qty 3

## 2019-10-03 MED ORDER — IPRATROPIUM-ALBUTEROL 0.5-2.5 (3) MG/3ML IN SOLN
RESPIRATORY_TRACT | Status: AC
  Administered 2019-10-03: 3 mL
  Filled 2019-10-03: qty 9

## 2019-10-03 MED ORDER — PREDNISONE 20 MG PO TABS
40.0000 mg | ORAL_TABLET | Freq: Every day | ORAL | Status: DC
  Administered 2019-10-04: 40 mg via ORAL
  Filled 2019-10-03: qty 2

## 2019-10-03 MED ORDER — DIGOXIN 125 MCG PO TABS
125.0000 ug | ORAL_TABLET | Freq: Every day | ORAL | Status: DC
  Administered 2019-10-03 – 2019-10-04 (×2): 125 ug via ORAL
  Filled 2019-10-03 (×2): qty 1

## 2019-10-03 MED ORDER — NITROGLYCERIN 0.4 MG SL SUBL
0.4000 mg | SUBLINGUAL_TABLET | SUBLINGUAL | Status: DC | PRN
  Administered 2019-10-03 (×2): 0.4 mg via SUBLINGUAL
  Filled 2019-10-03 (×3): qty 1

## 2019-10-03 MED ORDER — AMIODARONE HCL 200 MG PO TABS
200.0000 mg | ORAL_TABLET | Freq: Every day | ORAL | Status: DC
  Administered 2019-10-03 – 2019-10-04 (×2): 200 mg via ORAL
  Filled 2019-10-03 (×2): qty 1

## 2019-10-03 MED ORDER — AEROCHAMBER PLUS FLO-VU LARGE MISC
Status: AC
  Filled 2019-10-03: qty 1

## 2019-10-03 MED ORDER — SODIUM ZIRCONIUM CYCLOSILICATE 10 G PO PACK
10.0000 g | PACK | Freq: Once | ORAL | Status: AC
  Administered 2019-10-03: 10 g via ORAL
  Filled 2019-10-03: qty 1

## 2019-10-03 MED ORDER — THIAMINE HCL 100 MG PO TABS
100.0000 mg | ORAL_TABLET | Freq: Every day | ORAL | Status: DC
  Administered 2019-10-03 – 2019-10-04 (×2): 100 mg via ORAL
  Filled 2019-10-03 (×2): qty 1

## 2019-10-03 MED ORDER — ACETAMINOPHEN 650 MG RE SUPP: 650.0000 mg | Freq: Four times a day (QID) | RECTAL | Status: DC | PRN

## 2019-10-03 MED ORDER — MAGNESIUM SULFATE 2 GM/50ML IV SOLN
2.0000 g | Freq: Once | INTRAVENOUS | Status: AC
  Administered 2019-10-03: 2 g via INTRAVENOUS
  Filled 2019-10-03: qty 50

## 2019-10-03 MED ORDER — SACUBITRIL-VALSARTAN 97-103 MG PO TABS
1.0000 | ORAL_TABLET | Freq: Two times a day (BID) | ORAL | Status: DC
  Administered 2019-10-03 – 2019-10-04 (×3): 1 via ORAL
  Filled 2019-10-03 (×5): qty 1

## 2019-10-03 MED ORDER — ALBUTEROL SULFATE HFA 108 (90 BASE) MCG/ACT IN AERS
2.0000 | INHALATION_SPRAY | Freq: Once | RESPIRATORY_TRACT | Status: AC
  Administered 2019-10-03: 2 via RESPIRATORY_TRACT
  Filled 2019-10-03: qty 6.7

## 2019-10-03 MED ORDER — APIXABAN 5 MG PO TABS
5.0000 mg | ORAL_TABLET | Freq: Two times a day (BID) | ORAL | Status: DC
  Administered 2019-10-03 – 2019-10-04 (×3): 5 mg via ORAL
  Filled 2019-10-03 (×3): qty 1

## 2019-10-03 MED ORDER — ACETAMINOPHEN 325 MG PO TABS: 650.0000 mg | ORAL_TABLET | Freq: Four times a day (QID) | ORAL | Status: DC | PRN

## 2019-10-03 MED ORDER — VITAMIN D (ERGOCALCIFEROL) 1.25 MG (50000 UNIT) PO CAPS: 50000.0000 [IU] | ORAL_CAPSULE | ORAL | Status: DC

## 2019-10-03 MED ORDER — SERTRALINE HCL 50 MG PO TABS
50.0000 mg | ORAL_TABLET | Freq: Every day | ORAL | Status: DC
  Administered 2019-10-03 – 2019-10-04 (×2): 50 mg via ORAL
  Filled 2019-10-03 (×2): qty 1

## 2019-10-03 MED ORDER — METHYLPREDNISOLONE SODIUM SUCC 125 MG IJ SOLR
125.0000 mg | Freq: Once | INTRAMUSCULAR | Status: AC
  Administered 2019-10-03: 125 mg via INTRAVENOUS
  Filled 2019-10-03: qty 2

## 2019-10-03 NOTE — Hospital Course (Addendum)
Admitted 10/03/2019  Allergies: Penicillins Pertinent Hx: HFrEF (EF 25-30%), COPD, HTN, HLD, polysubstance use, breast cancer s/p mastectomy, chemo, and CKD stage 2  50 y.o. female p/w SOB, DOE, and wheezing  * COPD exacerbation: Sob, DOE, wheezing, decreased appetite 2-3 weeks, worsened this morning. Has been using cocaine for a few weeks, last use yesterday. Using albuterol 3-4 times per day. No fluid overload on exam. CXR showed hyperinflation, no infiltrate. Had respiratory distress, placed on BiPAP, weaned down to [place]. Improved with steroids, atrovent, mag, and albuterol. Started pred tmw, scheduled albuterol, atrovent, and BiPAP as needed.   *HFrEF: EF 25-30%, likely 2/2 cocaine use. Previously had been down to 15%. Not fluid overloaded, EKG unchanged from prior, trops flat. Will need to get set up app on discharge  *Hyperkalemia: K 5.2, with hemolysis, ?peaked t waves on EKG. Given lokelma and lasix in ED. Repeating BMP in evening  Consults: None  Meds: Amiodarone, Coreg, digoxin, entresto, spiro, sertraline, prednisone, albuterol, atrovent VTE ppx: Eliquis IVF: None Diet: HH

## 2019-10-03 NOTE — ED Notes (Signed)
resp easy, unlabored- O2 turned to 3L/m/[place]- MD paged re: diet order, pt has already eaten food from her personal belongings, coffee given,

## 2019-10-03 NOTE — H&P (Addendum)
Date: 10/03/2019               Patient Name:  Sabrina Holland MRN: [id number]  DOB: [birth year] Age / Sex: 49 y.o., female   PCP: Gildardo Pounds, NP         Medical Service: Internal Medicine Teaching Service         Attending Physician: Dr. Jimmye Norman, Elaina Pattee, MD    First Contact: Dr. Lisabeth Devoid Pager: [phone]  Second Contact: Dr. Marianna Payment Pager: [phone]       After Hours (After 5p/  First Contact Pager: [phone]  weekends / holidays): Second Contact Pager: [phone]   Chief Complaint: Worsening shortness of breath  History of Present Illness: This is a 49 year old female with a history of hypertension, heart failure with reduced ejection fraction(last EF 20-25%, grade 3 DD), COPD, polysubstance abuse (cocaine, opioids, THC, tobacco, and alcohol), CKD stage II, history of breast carcinoma (s/p L mastectomy, chemo, tamoxifen), anxiety, depression, and homelessness, paroxysmal afib presenting with a 2-week history of worsening shortness of breath that significantly worsened this morning.  She reports that a few weeks ago she started having shortness of breath, worsened with exertion, a productive cough with yellow phelgm, wheezing, and a decreased appetite. Denied fevers, nausea, vomiting, headaches, lightheadedness, dizziness, chest pain, or issues with urination. She reports that she started using cocaine again around this time, she reports a use 3 days ago and a use yesterday. She reports that she is taking all her medications as prescribed, she had her lasix discontinued a few months ago and she reports concern about this causing her shortness of breath. She started using albuterol up to 3-4 times per day. She reports missing her medications sometimes however has been taking them. She denies any leg swelling or orthopnea.   In the ED, she was noted to be afebrile, HR 70-80s, tachypneic to 26, initially hypertensive to 214/151, with improvement to 140s/90s. Labs showed K 5.2 with  hemolysis, normal CBC, troponins were 8 >9. BNP 92. CXR showed significant hyperinflation, no cardiomegaly, and no infiltrates noted. Patient developed significant respiratory distress and started having lethergy while she was in the ER, she was placed on BiPAP, given steroids and breathing treatments.   Meds:  No outpatient medications have been marked as taking for the 10/03/19 encounter [facility] Encounter).   Albuterol PRN Albuterol nebulizer PNR Amiodarone 200 mg daily Sertraline 50 mg daily Coreg 6.25 mg BID Digoxin 0/125 mg daily Eliquis 5 mg BID Ferrous sulfate 209 mg daily Folic acid 1 mg daily Entresto 97-103 mg daily Spironolactone 25 mg daily  Thiamine 100 mg daily  Allergies: Allergies as of 10/03/2019 - Review Complete 10/03/2019  Allergen Reaction Noted  . Penicillins Other (See Comments) 11/25/2011   Past Medical History:  Diagnosis Date  . Atrial flutter ([place]) 07/03/2018  . Breast cancer, stage 2 ([place]) 02/23/2011  . Chronic combined systolic and diastolic CHF (congestive heart failure) ([place])   . CKD (chronic kidney disease), stage II   . Cocaine use   . COPD (chronic obstructive pulmonary disease) ([place])   . Homelessness   . Hypertension   . Polysubstance abuse ([place])   . Poor social situation   . TIA (transient ischemic attack) 11/25/2011   "first time" (11/25/2011)    Family History:  Family History  Problem Relation Age of Onset  . COPD Father   . Sickle cell anemia Cousin        maternal cousin    Social  History: Reports smoking about 10 cigarettes per day. Reports drinking about 2 40 Oz beers per day, last drink was yesterday. Currently un homed however got section 8 so housing situation should improve shortly.   Review of Systems: A complete ROS was negative except as per HPI.   Physical Exam: Blood pressure (!) 134/94, pulse 72, temperature 97.7 F (36.5 C), temperature source Oral, resp. rate 15, last menstrual period 02/18/2015, SpO2 99  %. Physical Exam Vitals reviewed.  Constitutional:      Comments: Frail appearing, NAD, awake and alert  HENT:     Head: Normocephalic and atraumatic.     Mouth/Throat:     Comments: Poor dentition Cardiovascular:     Rate and Rhythm: Normal rate and regular rhythm.  Pulmonary:     Effort: No tachypnea or accessory muscle usage.     Breath sounds: Decreased breath sounds and wheezing (Diffuse wheezing) present. No rhonchi or rales.  Chest:     Chest wall: No mass or tenderness.  Abdominal:     General: Bowel sounds are normal.     Palpations: Abdomen is soft. There is no mass.     Tenderness: There is no abdominal tenderness.  Musculoskeletal:        General: Normal range of motion.     Cervical back: Normal range of motion and neck supple.     Right lower leg: No edema.     Left lower leg: No edema.  Skin:    General: Skin is dry.     Capillary Refill: Capillary refill takes less than 2 seconds.     Comments: Cold hands, warm feet  Neurological:     General: No focal deficit present.     Mental Status: She is alert.  Psychiatric:        Mood and Affect: Mood normal.        Behavior: Behavior normal.      EKG: personally reviewed my interpretation is NSR, no acute ST changes, TWI in V1, V2, and V3  CXR: personally reviewed my interpretation is hyperinflated, no cardiomegaly, no edema or infiltrates noted  Assessment & Plan by Problem: Active Problems:   COPD exacerbation (HCC)  Acute hypoxic respiratory failure secondary to COPD exacerbation This is a 49 year old female with a history of hypertension, heart failure with reduced ejection fraction(last EF 20-25%, grade 3 DD), COPD, polysubstance abuse(cocaine, opioids, THC, tobacco, and alcohol), CKD stage II, history of breast carcinoma (s/p L mastectomy, chemo, tamoxifen), anxiety, depression, and homelessness, paroxysmal afib presenting with a 2-week history of worsening shortness of breath that significantly worsened  this morning. Noted to have wheezing, productive cough, decreased appetite, and DOE. Increasing her use of albuterol inhalers. Patient does not appear significantly volume overloaded on exam. Chest x-ray showed severely hyperinflated lungs, no cardiomegaly or acute infiltrates noted. Patient noted to start having lethargy in the ER, VBG is obtained and patient placed on BiPAP and given albuterol, magnesium, and , with some improvement in her mental status. COVID test negative. CBC showed no leukocytosis, no fevers or infectious symptoms noted by patient. Unclear exacerbating factor for her COPD.   -Continue BiPAP as needed, wean down oxygen if able -Follow-up VBG -Albuterol q4 hr scheduled -Given one dose of IV Solu-Medrol, start prednisone 40 mg daily tomorrow -Atrovent daily -Encourage smoking cessation -May benefit from pulmonary rehab on discharge  Heart failure with reduced EF: Last echo showed EF 01-77%, grade 3 diastolic dysfunction. Follows with the heart care clinic and is  currently prescribed goal-directed therapy. On Coreg 6.25 mg twice daily, Entresto 97-103 mg twice daily, spironolactone 25 mg daily, digoxin 0.125 mg daily. She last saw cardiology on 01/31/2019, missed her follow-up appointment. Last note from then noted considering ICD placement. Patient does not appear to be volume overloaded on exam, has some cold hands however feet were warm, BP stable with no narrowed pulse pressure, CXR showed no pulmonary edema. Did receive 1 dose IV lasix for her hyperkalemia. Given the COPD and improvement with steroids and breathing treatment a COPD exacerbation seems to be the cause of her symptoms. Contacted cardiology regarding patient, advised to contact them when patient is close to discharge to set up outpatient follow up regarding her stable heart failure.   -Resume home Coreg 6.25 BID -Resume home entresto 97-103 mg BID -Resume home spironolactone 25 mg daily -Resume home digoxin 0.125  mg daily  Hyperkalemia: Potassium mildly elevated at 5.2 with hemolysis noted. PT was noted on EKG and patient given Lokelma and Lasix in the ER. Will monitor with repeat BMP later today.  -BMP in afternoon -Monitor BMP  Paroxysmal atrial fibrillation: Currently on amiodarone 200 mg daily at home and Eliquis 5 mg twice daily. She reports taking her medications as prescribed however states she will sometimes miss them. EKG today showed NSR.  -Resume home amiodarone 200 mg daily -Resume home eliquis 5 mg BID -Cardiac tele  Polysubstance abuse: Endorsed cocaine, THC, alcohol, and smoking use. Reports last cocaine use was yesterday. Also has a very poor living situation, currently is unhomed. Discussed nicotine patch for her smoking, patient reported that this caused her to hallucinate.   -CIWA protocol for EtOH withdrawal -Social work consult for resources  HTN:  BP well controlled here. Initially hypertensive up to  Resumed home BP medications as above.   Anxiety/Depression: Resumed home sertraline.   Dispo: Admit patient to Inpatient with expected length of stay greater than 2 midnights.  Signed: Asencion Noble, MD 10/03/2019, 1:25 PM  Pager: [phone] After 5pm on weekdays and 1pm on weekends: On Call pager: [phone]

## 2019-10-03 NOTE — ED Triage Notes (Signed)
To ED via [facility] for eval of SOB. Pt is from home. Pt states she was taken off of lasix but still takes it 'sometimes'. Has not taken this week. States she has used multiple breathing treatments in past 24 hrs. Pt appears sob. Per EMS report, pts RA sat 90% and up to 97% on 2L [place].

## 2019-10-03 NOTE — ED Provider Notes (Addendum)
Sabrina Holland   CSN: [id number] Arrival date & time: 10/03/19  0600     History Chief Complaint  Patient presents with  . Shortness of Breath    Sabrina Holland is a 49 y.o. female.  The history is provided by the patient and medical records. No language interpreter was used.  Shortness of Breath    49 year old female significant history of polysubstance abuse, COPD, CHF with EF 20-25%, homelessness, brought here via EMS for evaluation of shortness of breath.  Patient reports gradual onset of shortness of breath ongoing for the past 2 weeks.  Increase shortness of breath with exertion but present at rest.  She also endorsed wheezing, having cough, chills, now having chest pain.  Pain is described as tightness worse when she takes a deep breath.  Symptoms moderate to severe and have been persistent.  No report of fever, runny nose, sneezing, sore throat, loss of taste or smell, nausea vomiting diarrhea, abdominal pain, back pain, focal numbness or focal weakness.  She admits to tobacco use as well as cocaine use.  Last cocaine use was yesterday.  She was told by her provider that she does not need Lasix anymore.  Patient felt that not taking Lasix have caused her symptoms to worsen.  She however denies any edema to her extremities.  Patient has not been vaccinated for COVID-19.  Past Medical History:  Diagnosis Date  . Atrial flutter ([place]) 07/03/2018  . Breast cancer, stage 2 ([place]) 02/23/2011  . Chronic combined systolic and diastolic CHF (congestive heart failure) ([place])   . CKD (chronic kidney disease), stage II   . Cocaine use   . COPD (chronic obstructive pulmonary disease) ([place])   . Homelessness   . Hypertension   . Polysubstance abuse ([place])   . Poor social situation   . TIA (transient ischemic attack) 11/25/2011   "first time" (11/25/2011)    Patient Active Problem List   Diagnosis Date Noted  . Palliative care by  specialist   . Goals of care, counseling/discussion   . Acute respiratory failure ([place])   . Endotracheally intubated   . Torsades de pointes ([place])   . Cardiac arrest ([place])   . Atrial flutter ([place]) 07/03/2018  . Elevated LFTs 04/18/2018  . Malnutrition of moderate degree 02/27/2018  . Dyspnea 02/26/2018  . Acute on chronic systolic and diastolic heart failure, NYHA class 3 ([place]) 01/20/2018  . Cocaine abuse ([place]) 12/19/2017  . Mixed hyperlipidemia 12/19/2017  . COPD exacerbation ([place])   . Acute on chronic respiratory failure with hypoxemia ([place]) 12/03/2017  . Cardiomyopathy ([place]) 07/04/2017  . COPD (chronic obstructive pulmonary disease) ([place])   . Chronic combined systolic and diastolic heart failure ([place])   . Anemia 06/20/2017  . Polysubstance abuse ([place]) 11/26/2011  . TIA (transient ischemic attack) 11/25/2011  . Essential hypertension 11/25/2011  . Sinusitis 11/25/2011  . Vitamin B 12 deficiency 07/18/2011  . History of breast cancer 02/23/2011    Past Surgical History:  Procedure Laterality Date  . BREAST BIOPSY  2007   left  . MASTECTOMY  09/2005   left  . RIGHT/LEFT HEART CATH AND CORONARY ANGIOGRAPHY N/A 07/10/2018   Procedure: RIGHT/LEFT HEART CATH AND CORONARY ANGIOGRAPHY;  Surgeon: Jolaine Artist, MD;  Location: [facility];  Service: Cardiovascular;  Laterality: N/A;  . TONSILLECTOMY AND ADENOIDECTOMY     "when I was little" (11/25/2011)  . [place]  History    Gravida  5   Para  4   Term  4   Preterm  0   AB  1   Living        SAB  1   TAB  0   Ectopic  0   Multiple      Live Births              Family History  Problem Relation Age of Onset  . COPD Father   . Sickle cell anemia Cousin        maternal cousin    Social History   Tobacco Use  . Smoking status: Current Every Day Smoker    Packs/day: 5.00    Years: 30.00    Pack years: 150.00    Types: Cigarettes  . Smokeless tobacco: Never Used  .  Tobacco comment: 1 or 2 cigarettes a day- approx 1 pack week  Vaping Use  . Vaping Use: Never used  Substance Use Topics  . Alcohol use: Yes    Alcohol/week: 12.0 standard drinks    Types: 12 Cans of beer per week    Comment: last drink was 5/19  . Drug use: Yes    Types: Marijuana, Cocaine    Comment: daily marijuana; last use of cocaine 05/02/18 but selling it    Home Medications Prior to Admission medications   Medication Sig Start Date End Date Taking? Authorizing Provider  albuterol (PROVENTIL) (2.5 MG/3ML) 0.083% nebulizer solution Take 3 mLs (2.5 mg total) by nebulization every 6 (six) hours as needed for wheezing or shortness of breath. 09/27/19   Charlott Rakes, MD  albuterol (VENTOLIN HFA) 108 (90 Base) MCG/ACT inhaler Inhale 1-2 puffs into the lungs every 6 (six) hours as needed for wheezing or shortness of breath (cough). Will pick up scripts tomorrow 7-9. 09/16/19   Volney American, PA-C  amiodarone (PACERONE) 200 MG tablet TAKE ONE TABLET BY MOUTH ONCE DAILY 08/19/19   Bensimhon, Shaune Pascal, MD  carvedilol (COREG) 6.25 MG tablet Take 1 tablet (6.25 mg total) by mouth 2 (two) times daily. 01/31/19   Bensimhon, Shaune Pascal, MD  digoxin (LANOXIN) 0.125 MG tablet Take 1 tablet (125 mcg total) by mouth daily. Needs appt for further refills 06/13/19   Bensimhon, Shaune Pascal, MD  ELIQUIS 5 MG TABS tablet TAKE 1 TABLET BY MOUTH TWICE DAILY 07/23/19   Larey Dresser, MD  ferrous sulfate 325 (65 FE) MG tablet Take 1 tablet (325 mg total) by mouth 2 (two) times daily with a meal. 07/29/19   Gildardo Pounds, NP  fluticasone (FLONASE) 50 MCG/ACT nasal spray Place 2 sprays into both nostrils daily. 12/05/17   Eugenie Filler, MD  folic acid (FOLVITE) 1 MG tablet Take 1 tablet (1 mg total) by mouth daily. 07/29/19   Gildardo Pounds, NP  sacubitril-valsartan (ENTRESTO) 97-103 MG Take 1 tablet by mouth 2 (two) times daily. 01/02/19   Bensimhon, Shaune Pascal, MD  sertraline (ZOLOFT) 50 MG tablet Take 1  tablet (50 mg total) by mouth daily. 07/29/19 10/27/19  Gildardo Pounds, NP  spironolactone (ALDACTONE) 25 MG tablet Take 1 tablet (25 mg total) by mouth daily. F/U with Cardiology for additional refills 07/29/19 10/27/19  Gildardo Pounds, NP  thiamine 100 MG tablet Take 1 tablet (100 mg total) by mouth daily. 07/29/19   Gildardo Pounds, NP  Vitamin D, Ergocalciferol, (DRISDOL) 1.25 MG (50000 UNIT) CAPS capsule Take 1 capsule (50,000 Units total) by  mouth every 7 (seven) days. 07/30/19   Gildardo Pounds, NP    Allergies    Penicillins  Review of Systems   Review of Systems  Respiratory: Positive for shortness of breath.   All other systems reviewed and are negative.   Physical Exam Updated Vital Signs BP (!) 172/120 (BP Location: Left Arm)   Pulse 91   Temp 97.7 F (36.5 C) (Oral)   Resp (!) 24   LMP 02/18/2015 (Exact Date) Comment: MAYBE ONCE A YEAR  SpO2 96%   Physical Exam Vitals and nursing Holland reviewed.  Constitutional:      General: She is not in acute distress.    Appearance: She is well-developed.     Comments: Patient appears uncomfortable, sitting up, leaning forward in mild respiratory discomfort.  HENT:     Head: Atraumatic.  Eyes:     Conjunctiva/sclera: Conjunctivae normal.  Cardiovascular:     Rate and Rhythm: Normal rate and regular rhythm.  Pulmonary:     Effort: Tachypnea present. No accessory muscle usage.     Breath sounds: No stridor. Decreased breath sounds and wheezing present. No rhonchi or rales.  Chest:     Chest wall: No tenderness.  Abdominal:     Palpations: Abdomen is soft.     Tenderness: There is no abdominal tenderness.  Musculoskeletal:     Cervical back: Neck supple.     Right lower leg: No edema.     Left lower leg: No edema.  Skin:    Capillary Refill: Capillary refill takes less than 2 seconds.     Findings: No rash.  Neurological:     Mental Status: She is alert and oriented to person, place, and time.  Psychiatric:         Mood and Affect: Mood normal.     ED Results / Procedures / Treatments   Labs (all labs ordered are listed, but only abnormal results are displayed) Labs Reviewed  BASIC METABOLIC PANEL - Abnormal; Notable for the following components:      Result Value   Potassium 5.2 (*)    Glucose, Bld 107 (*)    All other components within normal limits  SARS CORONAVIRUS 2 BY RT PCR (HOSPITAL ORDER, [facility])  CBC  RAPID URINE DRUG SCREEN, HOSP PERFORMED  URINALYSIS, ROUTINE W REFLEX MICROSCOPIC  DIGOXIN LEVEL  BLOOD GAS, VENOUS  BRAIN NATRIURETIC PEPTIDE  TROPONIN I (HIGH SENSITIVITY)  TROPONIN I (HIGH SENSITIVITY)    EKG EKG Interpretation  Date/Time:  Thursday October 03 2019 06:20:56 EDT Ventricular Rate:  80 PR Interval:    QRS Duration: 95 QT Interval:  397 QTC Calculation: 458 R Axis:   91 Text Interpretation: Sinus rhythm Biatrial enlargement Left ventricular hypertrophy Anterior infarct, old Abnrm T, consider ischemia, anterolateral lds peaked T waves? Confirmed by Ezequiel Essex [id number]) on 10/03/2019 6:31:56 AM   Radiology DG Chest Portable 1 View  Result Date: 10/03/2019 CLINICAL DATA:  Shortness of breath EXAM: PORTABLE CHEST 1 VIEW COMPARISON:  09/24/2019 FINDINGS: Large lung volumes. There is no edema, consolidation, effusion, or pneumothorax. Borderline heart size which is distorted by rightward rotation. Negative aortic contours. Artifact from EKG leads. IMPRESSION: Emphysema without acute superimposed finding. Electronically Signed   By: Monte Fantasia M.D.   On: 10/03/2019 06:47    Procedures .Critical Care Performed by: Domenic Moras, PA-C Authorized by: Domenic Moras, PA-C   Critical care provider statement:    Critical care time (minutes):  32  Critical care was time spent personally by me on the following activities:  Discussions with consultants, evaluation of patient's response to treatment, examination of patient, ordering  and performing treatments and interventions, ordering and review of laboratory studies, ordering and review of radiographic studies, pulse oximetry, re-evaluation of patient's condition, obtaining history from patient or surrogate and review of old charts   (including critical care time)  Medications Ordered in ED Medications  nitroGLYCERIN (NITROSTAT) SL tablet 0.4 mg (0.4 mg Sublingual Given 10/03/19 0751)  AeroChamber Plus Flo-Vu Large MISC (has no administration in time range)  ipratropium (ATROVENT HFA) inhaler 2 puff (has no administration in time range)  sodium zirconium cyclosilicate (LOKELMA) packet 10 g (has no administration in time range)  enalaprilat (VASOTEC) injection 1.25 mg (has no administration in time range)  albuterol (VENTOLIN HFA) 108 (90 Base) MCG/ACT inhaler 2 puff (2 puffs Inhalation Given 10/03/19 0746)  morphine 4 MG/ML injection 4 mg (4 mg Intravenous Given 10/03/19 0750)  magnesium sulfate IVPB 2 g 50 mL (2 g Intravenous New Bag/Given 10/03/19 0819)  methylPREDNISolone sodium succinate (SOLU-MEDROL) 125 mg/2 mL injection 125 mg (125 mg Intravenous Given 10/03/19 0803)  furosemide (LASIX) injection 40 mg (40 mg Intravenous Given 10/03/19 0810)  ipratropium-albuterol (DUONEB) 0.5-2.5 (3) MG/3ML nebulizer solution (3 mLs  Given 10/03/19 6010)    ED Course  I have reviewed the triage vital signs and the nursing notes.  Pertinent labs & imaging results that were available during my care of the patient were reviewed by me and considered in my medical decision making (see chart for details).    MDM Rules/Calculators/A&P                          BP (!) 172/120 (BP Location: Left Arm)   Pulse 91   Temp 97.7 F (36.5 C) (Oral)   Resp (!) 24   LMP 02/18/2015 (Exact Date) Comment: MAYBE ONCE A YEAR  SpO2 96%   Final Clinical Impression(s) / ED Diagnoses Final diagnoses:  COPD exacerbation ([place])  Acute respiratory failure, unspecified whether with hypoxia or  hypercapnia (HCC)  Hyperkalemia  Acute on chronic congestive heart failure, unspecified heart failure type ([place])    Rx / DC Orders ED Discharge Orders    None     7:21 AM Patient report progressive worsening shortness of breath ongoing for the past 2 weeks.  On exam, she is actively wheezing and her respiratory discomfort.  Fortunately O2 sats is at 97% on 2 L.  Patient does not appear to be fluid overload to suggest CHF exacerbation.  She does admits to cocaine use but at this time have low suspicion for dissection causing her symptoms.  Work-up initiated.  Will obtain Covid test as well.  7:58 AM Patient exhibiting worsening shortness of breath, I suspect this is COPD exacerbation therefore will give Atrovent, Solu-Medrol, mag sulfate as well as supplemental oxygen.  O2 sats at 87% on room air.  Labs remarkable for potassium of 5.2 with slight hemolysis.  EKG does show peaked T wave changes therefore, will provide Lasix as well as Lokelma to help lower her potassium level.  She has normal troponin.  Chest x-ray shows emphysema without acute superimposed finding.  No significant edema or consolidation.  No effusion or pneumothorax.  COVID-19 test is currently pending.  8:35 AM  Pt tripoding ,appears uncomfortable and moderate respiratory discomfort. Pt placed on bi-pap with high flow oxygen, magnesium, solumedrol, lasix, albuterol, lokelma was  initiated.  She steadily improves but does appears drowsy. Will check VBG to assess potential hypercarbia. Care discussed with Dr. Tyrone Nine.   10:15 AM Appreciate consultation from Internal Medicine resident who agrees to see and admit pt. Several labs are currently pending including Digoxin, BNP, repeat trop and UDS  Sabrina Holland was evaluated in Emergency Department on 10/03/2019 for the symptoms described in the history of present illness. She was evaluated in the context of the global COVID-19 pandemic, which necessitated consideration that  the patient might be at risk for infection with the SARS-CoV-2 virus that causes COVID-19. Institutional protocols and algorithms that pertain to the evaluation of patients at risk for COVID-19 are in a state of rapid change based on information released by regulatory bodies including the CDC and federal and state organizations. These policies and algorithms were followed during the patient's care in the ED.    Domenic Moras, PA-C 10/03/19 1020    Domenic Moras, PA-C 10/03/19 [place], DO 10/03/19 1102

## 2019-10-03 NOTE — ED Notes (Signed)
Pt calling out-- c/o shortness of breath, tripodding in bed, diaphoretic, stating "I am going to die, I can't breathe, I need help"  Dr. Tyrone Nine and Gertie Fey PA made aware. RT notified,

## 2019-10-04 ENCOUNTER — Encounter (HOSPITAL_COMMUNITY): Payer: Self-pay | Admitting: Internal Medicine

## 2019-10-04 LAB — BASIC METABOLIC PANEL
Anion gap: 12 (ref 5–15)
BUN: 22 mg/dL — ABNORMAL HIGH (ref 6–20)
CO2: 27 mmol/L (ref 22–32)
Calcium: 9.1 mg/dL (ref 8.9–10.3)
Chloride: 97 mmol/L — ABNORMAL LOW (ref 98–111)
Creatinine, Ser: 1.07 mg/dL — ABNORMAL HIGH (ref 0.44–1.00)
GFR calc Af Amer: >60 mL/min (ref >60)
GFR calc non Af Amer: >60 mL/min (ref >60)
Glucose, Bld: 90 mg/dL (ref 70–99)
Potassium: 3.4 mmol/L — ABNORMAL LOW (ref 3.5–5.1)
Sodium: 136 mmol/L (ref 135–145)

## 2019-10-04 LAB — CBC
HCT: 43 % (ref 36.0–46.0)
Hemoglobin: 13.5 g/dL (ref 12.0–15.0)
MCH: 28.2 pg (ref 26.0–34.0)
MCHC: 31.4 g/dL (ref 30.0–36.0)
MCV: 89.8 fL (ref 80.0–100.0)
Platelets: 334 10*3/uL (ref 150–400)
RBC: 4.79 MIL/uL (ref 3.87–5.11)
RDW: 13.9 % (ref 11.5–15.5)
WBC: 11.5 10*3/uL — ABNORMAL HIGH (ref 4.0–10.5)
nRBC: 0 % (ref 0.0–0.2)

## 2019-10-04 MED ORDER — PREDNISONE 20 MG PO TABS: 40.0000 mg | ORAL_TABLET | Freq: Every day | ORAL | 0 refills | Status: AC

## 2019-10-04 MED ORDER — PNEUMOCOCCAL VAC POLYVALENT 25 MCG/0.5ML IJ INJ: 0.5000 mL | INJECTION | INTRAMUSCULAR | Status: DC

## 2019-10-04 MED ORDER — INFLUENZA VAC SPLIT QUAD 0.5 ML IM SUSY: 0.5000 mL | PREFILLED_SYRINGE | INTRAMUSCULAR | Status: DC

## 2019-10-04 NOTE — Progress Notes (Signed)
HD#1 Subjective:  Overnight Events: none  States she is feeling improvement in breathing. This morning had a 5 minute episode after waking up. Notes her shoulders and chest are sore, notes this often happens after she has trouble breathing but usually improves over a couple of days. She denies fever, chills, abdominal pain, dysuria, and diarrhea.  Objective:  Vital signs in last 24 hours: Vitals:   10/03/19 2203 10/04/19 0401 10/04/19 0741 10/04/19 0759  BP: 112/89   109/84  Pulse: 85 78 74   Resp: 18 20 14    Temp: 97.7 F (36.5 C) 97.8 F (36.6 C)  97.8 F (36.6 C)  TempSrc: Oral Oral  Oral  SpO2: 94% 95% 93%    Supplemental O2: Nasal Cannula SpO2: 93 % O2 Flow Rate (L/min): 2 L/min FiO2 (%): 35 %   Physical Exam:  Physical Exam Constitutional:      Appearance: She is well-developed.  Cardiovascular:     Rate and Rhythm: Normal rate and regular rhythm.  Pulmonary:     Effort: Pulmonary effort is normal. No tachypnea or respiratory distress.     Breath sounds: Decreased breath sounds present.  Abdominal:     General: Bowel sounds are normal.     Palpations: Abdomen is soft.  Skin:    Capillary Refill: Capillary refill takes less than 2 seconds.  Neurological:     General: No focal deficit present.     Mental Status: She is alert and oriented to person, place, and time.     There were no vitals filed for this visit.   Intake/Output Summary (Last 24 hours) at 10/04/2019 1006 Last data filed at 10/03/2019 1021 Gross per 24 hour  Intake 50 ml  Output --  Net 50 ml   Net IO Since Admission: 50 mL [10/04/19 1006]  Pertinent Labs: CBC Latest Ref Rng & Units 10/04/2019 10/03/2019 09/24/2019  WBC 4.0 - 10.5 K/uL 11.5(H) 7.4 6.3  Hemoglobin 12.0 - 15.0 g/dL 13.5 13.7 15.0  Hematocrit 36 - 46 % 43.0 43.7 47.2(H)  Platelets 150 - 400 K/uL 334 325 362    CMP Latest Ref Rng & Units 10/03/2019 10/03/2019 09/24/2019  Glucose 70 - 99 mg/dL 177(H) 107(H) 114(H)  BUN 6 -  20 mg/dL 19 13 17   Creatinine 0.44 - 1.00 mg/dL 1.10(H) 0.94 1.18(H)  Sodium 135 - 145 mmol/L 136 138 135  Potassium 3.5 - 5.1 mmol/L 4.1 5.2(H) 4.1  Chloride 98 - 111 mmol/L 97(L) 103 96(L)  CO2 22 - 32 mmol/L 27 27 29   Calcium 8.9 - 10.3 mg/dL 9.8 9.6 9.8  Total Protein 6.0 - 8.5 g/dL - - -  Total Bilirubin 0.0 - 1.2 mg/dL - - -  Alkaline Phos 48 - 121 IU/L - - -  AST 0 - 40 IU/L - - -  ALT 0 - 32 IU/L - - -    Imaging: No results found.  Assessment/Plan:   Active Problems:   COPD exacerbation [facility])   Patient Summary: This is a 49 year old female with a history of hypertension, heart failure with reduced ejection fraction(last EF 20-25%, grade 3 DD), COPD, polysubstance abuse(cocaine, opioids, THC, tobacco, and alcohol), CKD stage II, history of breast carcinoma (s/p L mastectomy, chemo, tamoxifen), anxiety, depression, and homelessness, paroxysmal afib presenting with a 2-week history of worsening shortness of breath that significantly worsened this morning. Noted to have wheezing, productive cough, decreased appetite, and DOE. Increasing her use of albuterol inhalers. Admitted for COPD exacerbation likely due  to resumed cocaine and tobacco use  COPD exacerbation  No longer in respiratory distress. Comfortable on room air satting 92%, - Will discharge on 5 days course of prednisone 40 mg  - Encourage smoking cessation - continue home inhalers  Heart failure with reduced EF: Patient does not appear significantly volume overloaded no SOB, JVD or edema on exam.  -Discharged on Coreg 6.25 BID, entresto 97-103 mg BID, spironolactone 25 mg daily, digoxin 0.125 mg daily  Hyperkalemia: Potassium mildly elevated at 5.2 on admission Lokelma and Lasix in the ER. K improved to 4.1 today  Paroxysmal atrial fibrillation: Currently on amiodarone 200 mg daily at home and Eliquis 5 mg twice daily. EKG today showed NSR.  - Continued home medications  Polysubstance abuse: Endorsed  cocaine, THC, alcohol, and smoking use. Reports last cocaine use was 2 days ago. Also has a very poor living situation, currently is unhomed. Needs to find housing with section 8. Currently living with partner. Declined nicotine patch due to hallucinations in the past.   HTN:  BP well controlled here. Resumed home BP medications as above.   Anxiety/Depression: Resumed home sertraline.  Diet: Heart Healthy IVF: None,None VTE: None Code: Full PT/OT recs: None, none. TOC recs: None   Dispo: Anticipated discharge to Home today.   Iona Beard, MD 10/04/2019, 10:06 AM Pager: [phone]  Please contact the on call pager after 5 pm and on weekends at [phone].

## 2019-10-04 NOTE — Plan of Care (Signed)
  Problem: Education: Goal: Knowledge of General Education information will improve Description: Including pain rating scale, medication(s)/side effects and non-pharmacologic comfort measures Outcome: Progressing   Problem: Education: Goal: Knowledge of General Education information will improve Description: Including pain rating scale, medication(s)/side effects and non-pharmacologic comfort measures Outcome: Progressing   Problem: Health Behavior/Discharge Planning: Goal: Ability to manage health-related needs will improve Outcome: Progressing   Problem: Clinical Measurements: Goal: Ability to maintain clinical measurements within normal limits will improve Outcome: Progressing   Problem: Activity: Goal: Risk for activity intolerance will decrease Outcome: Progressing

## 2019-10-04 NOTE — TOC Initial Note (Signed)
Transition of Care [facility]) - Initial/Assessment Note    Patient Details  Name: Sabrina Holland MRN: [id number] Date of Birth: 10-03-1970  [facility]) CM/SW Contact:    Joanne Chars, LCSW Phone Number: 10/04/2019, 2:33 PM  Clinical Narrative: CSW met with pt related to substance abuse consult.  Pt friend Reggie in the room, permission given to speak with Reggie present.  Pt reports she is staying with Reggie currently. Pt in a hurry, states MD just told her she would be DC today.  Pt reports she uses cocaine daily for the past 15 years, declined to specify how much, "a lot", Reggie adds that it is "over a gram" daily.  Pt denied any use of opiates or alcohol.  Pt did indicate she wanted information on treatment but said she was not interested in residential currently.  CSW provided resource list/contact information.    Per RN, MD is cancelling PT/OT evals and there is no need for [facility].               Expected Discharge Plan: Home/Self Care Barriers to Discharge: No Barriers Identified   Patient Goals and CMS Choice Patient states their goals for this hospitalization and ongoing recovery are:: stop smoking      Expected Discharge Plan and Services Expected Discharge Plan: Home/Self Care     Post Acute Care Choice: NA Living arrangements for the past 2 months: Apartment                 DME Arranged: N/A         HH Arranged: NA          Prior Living Arrangements/Services Living arrangements for the past 2 months: Apartment Lives with:: Friends Patient language and need for interpreter reviewed:: Yes Do you feel safe going back to the place where you live?: Yes      Need for Family Participation in Patient Care: No (Comment) Care giver support system in place?: Yes (comment)   Criminal Activity/Legal Involvement Pertinent to Current Situation/Hospitalization: No - Comment as needed  Activities of Daily Living Home Assistive Devices/Equipment:  None ADL Screening (condition at time of admission) Patient's cognitive ability adequate to safely complete daily activities?: Yes Is the patient deaf or have difficulty hearing?: No Does the patient have difficulty seeing, even when wearing glasses/contacts?: Yes Does the patient have difficulty concentrating, remembering, or making decisions?: No Patient able to express need for assistance with ADLs?: Yes Does the patient have difficulty dressing or bathing?: Yes Independently performs ADLs?: Yes (appropriate for developmental age) Does the patient have difficulty walking or climbing stairs?: No Weakness of Legs: None Weakness of Arms/Hands: None  Permission Sought/Granted Permission sought to share information with : Family Supports Permission granted to share information with : No              Emotional Assessment Appearance:: Appears stated age Attitude/Demeanor/Rapport: Engaged Affect (typically observed): Blunt Orientation: : Oriented to Self, Oriented to Place, Oriented to  Time, Oriented to Situation Alcohol / Substance Use: Illicit Drugs Psych Involvement: No (comment)  Admission diagnosis:  Hyperkalemia [E87.5] COPD exacerbation (HCC) [J44.1] Acute respiratory failure, unspecified whether with hypoxia or hypercapnia (HCC) [J96.00] Acute on chronic congestive heart failure, unspecified heart failure type [place]) [I50.9] Patient Active Problem List   Diagnosis Date Noted  . Palliative care by specialist   . Goals of care, counseling/discussion   . Acute respiratory failure ([place])   . Endotracheally intubated   . Torsades  de pointes ([place])   . Cardiac arrest ([place])   . Atrial flutter ([place]) 07/03/2018  . Elevated LFTs 04/18/2018  . Malnutrition of moderate degree 02/27/2018  . Dyspnea 02/26/2018  . Acute on chronic systolic and diastolic heart failure, NYHA class 3 ([place]) 01/20/2018  . Cocaine abuse ([place]) 12/19/2017  . Mixed hyperlipidemia 12/19/2017  . COPD  exacerbation ([place])   . Acute on chronic respiratory failure with hypoxemia ([place]) 12/03/2017  . Cardiomyopathy ([place]) 07/04/2017  . COPD (chronic obstructive pulmonary disease) ([place])   . Chronic combined systolic and diastolic heart failure ([place])   . Anemia 06/20/2017  . Polysubstance abuse ([place]) 11/26/2011  . TIA (transient ischemic attack) 11/25/2011  . Essential hypertension 11/25/2011  . Sinusitis 11/25/2011  . Vitamin B 12 deficiency 07/18/2011  . History of breast cancer 02/23/2011   PCP:  Gildardo Pounds, NP Pharmacy:   [facility], [address] [place] [address] Phone: [phone] Fax: [place], [address] [place] [address] Phone: [phone] Fax: [phone]     Social Determinants of Health (SDOH) Interventions    Readmission Risk Interventions No flowsheet data found.

## 2019-10-07 ENCOUNTER — Other Ambulatory Visit: Payer: Self-pay | Admitting: *Deleted

## 2019-10-07 ENCOUNTER — Telehealth: Payer: Self-pay

## 2019-10-07 ENCOUNTER — Other Ambulatory Visit: Payer: Self-pay

## 2019-10-07 NOTE — Patient Instructions (Addendum)
Visit Information  Sabrina Holland was given information about Medicaid Managed Care team care coordination services and consented to engagement with the [facility] Managed Care team.   Goals Addressed              This Visit's Progress   .  "I need help finding housing" (pt-stated)        [place] Medicaid Managed Care (see longitudinal plan of care for additional care plan information)  Current Barriers:  Marland Kitchen Knowledge Deficits related to procuring housing-Patient has been approved for section 8, however she is having difficulty finding a one bedroom in her budget . Film/video editor.   Nurse Case Manager Clinical Goal(s):  Marland Kitchen Over the next 30 days, patient will work with CM clinical social worker to find affordable housing  Interventions:  . Inter-disciplinary care team collaboration (see longitudinal plan of care) . Social Work referral for affordable housing options  Plan:  . Patient will continue to look for housing . RNCM will collaborate with BSW for assistance with housing needs   Initial goal documentation     .  "I need to get my prescription for prednisone and to make an appointment with my heart doctor" (pt-stated)        [place] Medicaid Managed Care (see longitudinal plan of care for additional care plan information)  Current Barriers:  . Care Coordination needs related to procuring prescription medication and appointment with patients heart doctor. Patient stated that she could not get her medication until she got some money. Patient has not seen Dr. Haroldine Laws since January 2021 and she needs to make an appointment.  Nurse Case Manager Clinical Goal(s):  Marland Kitchen Over the next 30 days, patient will meet with RN Care Manager to address follow up with Cardiologist . Over the next 30 days, patient will demonstrate improved adherence to prescribed treatment plan for COPD as evidenced by obtaining prescribed medication and taking as prescribed  . Over the  next 30 days, patient will demonstrate improved health management independence as evidenced by attending all follow up appointments . Over the next 120 days, patient will experience decrease in ED visits. ED visits in last 6 months = 3  Interventions:  . Inter-disciplinary care team collaboration (see longitudinal plan of care) . Evaluation of current treatment plan related to medication and patient's adherence to plan as established by provider. . Advised patient to call Dr. Clayborne Dana office and schedule an appointment-phone number provided to patient-[phone]. Noted in appointment desk that patient has called and scheduled appt on 10/09/19 . Reviewed medications with patient and discussed need to pickup prednisone-RNCM called [facility], prednisone is $3 . Collaborated with pharmacy regarding cost of medication . Discussed plans with patient for ongoing care management follow up and provided patient with direct contact information for care management team  Plan:  . Patient will pick up medication today and call for Cardiology appointment today . RNCM will follow up with patient within 30 days   Initial goal documentation     .  "I need to get off these cigarettes" (pt-stated)        [place] (see longitudinal plan of care for additional care plan information)  Current Barriers:  Marland Kitchen Knowledge Deficits related to smoking Cessation. Patient would like to quit smoking, she is unable to use nicotine patches due to her having bad dreams while using patches in the past.  Patient is interested in exploring other options.  Nurse Case  Manager Clinical Goal(s):  Marland Kitchen Over the next 30 days, patient will work with Primary Care Provider to address smoking cessation  Interventions:  . Inter-disciplinary care team collaboration (see longitudinal plan of care) . Provided patient with [facility] educational materials related to smoking cessation . Notify provider of  patients desire to explore options related to smoking cessation  Plan:  . Patient will continue to cut down on the number of cigarettes per day . RNCM will collaborate with PCP regarding smoking cessation   Initial goal documentation        Please see education materials related to smoking cessation and heart healthy diet provided by e-mail link.  Patient verbalizes understanding of instructions provided today.   The Managed Medicaid care management team will reach out to the patient again over the next 30 days.   Sabrina Joiner RN, BSN [place]  [employer] manager

## 2019-10-07 NOTE — Telephone Encounter (Signed)
Transition Care Management Follow-up Telephone Call  Date of discharge and from where: 10/04/2019, [facility]   How have you been since you were released from the hospital? She explained that she just got off from work. She was upset about being hospitalized. She said that she ended up in the hospital because she did not have all of her medications.  She said she tried to call her providers but did not receive a call back.  Explained to her that she could be seen by her PCP tomorrow and she has an appointment with cardiology the following day and she can address all of her medication questions at that time,   Any questions or concerns? noted above  Items Reviewed:  Did the pt receive and understand the discharge instructions provided? she has them but not with her at the time of this call  Medications obtained and verified?  she just got off work and was not sure if she has all medications.  She will check when she gets home and will call [facility] to inquire about  the status of the prednisone order.  [facility] will deliver meds to her home.   Any new allergies since your discharge?  none reported   Do you have support at home?  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  She said that she has a section 8 voucher but has not been able to find housing.    Has nebulizer.   No home health or DME ordered.   Has been followed by [facility].    Functional Questionnaire: (I = Independent and D = Dependent) ADLs:independent  Follow up appointments reviewed:   PCP Hospital f/u appt confirmed? [name], NP 10/08/2019  Specialist Hospital f/u appt confirmed?  Heart Failure - 10/09/2019  Are transportation arrangements needed?  she said she will take the bus, she said it is too late to schedule medicaid transportation  If their condition worsens, is the pt aware to call PCP or go to the Emergency Dept.?   yes  Was the patient provided with contact information for the PCP's office or ED?  she has the phone number for the clinic  Was to pt encouraged to call back with questions or concerns?  yes

## 2019-10-07 NOTE — Progress Notes (Signed)
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  She had a normal echocardiogram in 2013 as part of a work-up for TIA. In 06/2017  admitted with SOB and initially had left AMA. However, she returned and was found to have severe CHF with EF 25-30%, grade 3 DD. She was diuresed and started on guideline directed therapy. She has been following with Dr. Recardo Evangelist but therapy limited by her social situation and recurrent cocaine use. Echo 11/2017 showed EF 25-30%, mild MR, mild LAE, PASP 59mmHg. She was admitted in 02/2018 for recurrent HF. She was not taking Lasix as prescribed due to frequent urination at work. She refused coronary CT or cath at that time   Presented to [facility] with increased SOB and chest tightness. +  For cocaine on admit. VF arrest on 07/06/18 with defibrillation x1 and intubation.ECHO showed severely reduced EF at 15%.  Treated for PNA. Had [place] with normal cors. Discharged to home on 07/12/2018.   Echo 10/20 EF 20-25%  Admitted 9/16-17/21 for COPD flare in setting of starting to use cocaine again after 11 months clean. BNP 92. Hstrop negative. Says it was due to her living situation. "I had no place to go." Smoking half pack per day. Mild DOE. No CP or edema. No bleeding on Eliquis.    ROS: All systems negative except as listed in HPI, PMH and Problem List.  SH:  Social History   Socioeconomic History  . Marital status: Single    Spouse name: Not on file  . Number of children: 4  . Years of education: Not on file  . Highest education level: Not on file  Occupational  History  . Occupation: trying to get disability    Employer: [employer]  Tobacco Use  . Smoking status: Current Every Day Smoker    Packs/day: 5.00    Years: 30.00    Pack years: 150.00    Types: Cigarettes  . Smokeless tobacco: Never Used  . Tobacco comment: 1 or 2 cigarettes a day- approx 1 pack week  Vaping Use  . Vaping Use: Never used  Substance and Sexual Activity  . Alcohol use: Yes    Alcohol/week: 12.0 standard drinks    Types: 12 Cans of beer per week    Comment: last drink was 5/19  . Drug use: Yes    Types: Marijuana, Cocaine    Comment: daily marijuana; last use of cocaine 05/02/18 but selling it  . Sexual activity: Not Currently    Birth control/protection: Surgical  Other Topics Concern  . Not on file  Social History Narrative   ** Merged History Encounter **       Social Determinants of Health   Financial Resource Strain:   . Difficulty of Paying Living Expenses: Not on file  Food Insecurity:   . Worried About Charity fundraiser in the Last Year: Not on file  . Ran Out of Food in the Last Year: Not on file  Transportation Needs:   . Lack of Transportation (Medical): Not on file  . Lack of Transportation (Non-Medical): Not on file  Physical Activity:   . Days of Exercise per  Week: Not on file  . Minutes of Exercise per Session: Not on file  Stress:   . Feeling of Stress : Not on file  Social Connections:   . Frequency of Communication with Friends and Family: Not on file  . Frequency of Social Gatherings with Friends and Family: Not on file  . Attends Religious Services: Not on file  . Active Member of Clubs or Organizations: Not on file  . Attends Archivist Meetings: Not on file  . Marital Status: Not on file  Intimate Partner Violence:   . Fear of Current or Ex-Partner: Not on file  . Emotionally Abused: Not on file  . Physically Abused: Not on file  . Sexually Abused: Not on file    FH:  Family History  Problem Relation Age of  Onset  . COPD Father   . Sickle cell anemia Cousin        maternal cousin    Past Medical History:  Diagnosis Date  . Atrial flutter ([place]) 07/03/2018  . Breast cancer, stage 2 ([place]) 02/23/2011  . Chronic combined systolic and diastolic CHF (congestive heart failure) ([place])   . CKD (chronic kidney disease), stage II   . Cocaine use   . COPD (chronic obstructive pulmonary disease) ([place])   . Homelessness   . Hypertension   . Polysubstance abuse ([place])   . Poor social situation   . TIA (transient ischemic attack) 11/25/2011   "first time" (11/25/2011)    Current Outpatient Medications  Medication Sig Dispense Refill  . albuterol (PROVENTIL) (2.5 MG/3ML) 0.083% nebulizer solution Take 3 mLs (2.5 mg total) by nebulization every 6 (six) hours as needed for wheezing or shortness of breath. 150 mL 1  . albuterol (VENTOLIN HFA) 108 (90 Base) MCG/ACT inhaler Inhale 1-2 puffs into the lungs every 6 (six) hours as needed for wheezing or shortness of breath (cough). Will pick up scripts tomorrow 7-9. 18 g 0  . amiodarone (PACERONE) 200 MG tablet TAKE ONE TABLET BY MOUTH ONCE DAILY (Patient taking differently: Take 200 mg by mouth daily. ) 30 tablet 2  . carvedilol (COREG) 6.25 MG tablet Take 1 tablet (6.25 mg total) by mouth 2 (two) times daily. 180 tablet 3  . digoxin (LANOXIN) 0.125 MG tablet Take 1 tablet (125 mcg total) by mouth daily. Needs appt for further refills 30 tablet 2  . ELIQUIS 5 MG TABS tablet TAKE 1 TABLET BY MOUTH TWICE DAILY (Patient taking differently: Take 5 mg by mouth 2 (two) times daily. ) 180 tablet 3  . ferrous sulfate 325 (65 FE) MG tablet Take 1 tablet (325 mg total) by mouth 2 (two) times daily with a meal. 180 tablet 3  . fluticasone (FLONASE) 50 MCG/ACT nasal spray Place 2 sprays into both nostrils daily. (Patient not taking: Reported on 5/40/0867) 16 g 0  . folic acid (FOLVITE) 1 MG tablet Take 1 tablet (1 mg total) by mouth daily. 30 tablet 2  . predniSONE (DELTASONE) 20  MG tablet Take 2 tablets (40 mg total) by mouth daily with breakfast for 3 days. (Patient not taking: Reported on 10/07/2019) 5 tablet 0  . sacubitril-valsartan (ENTRESTO) 97-103 MG Take 1 tablet by mouth 2 (two) times daily. 60 tablet 11  . sertraline (ZOLOFT) 50 MG tablet Take 1 tablet (50 mg total) by mouth daily. 90 tablet 3  . spironolactone (ALDACTONE) 25 MG tablet Take 1 tablet (25 mg total) by mouth daily. F/U with Cardiology for additional refills 30 tablet 0  .  thiamine 100 MG tablet Take 1 tablet (100 mg total) by mouth daily. 30 tablet 3  . Vitamin D, Ergocalciferol, (DRISDOL) 1.25 MG (50000 UNIT) CAPS capsule Take 1 capsule (50,000 Units total) by mouth every 7 (seven) days. (Patient taking differently: Take 50,000 Units by mouth every 7 (seven) days. Sundays) 12 capsule 1   No current facility-administered medications for this encounter.    Vitals:   10/09/19 1158  BP: 128/80  Pulse: 81  SpO2: 95%  Weight: 53.7 kg  Height: 5\' 4"  (1.626 m)   Wt Readings from Last 3 Encounters:  09/24/19 54.4 kg (120 lb)  07/29/19 54.9 kg (121 lb)  02/13/19 59.9 kg (132 lb)    PHYSICAL EXAM:  General:  Thin female. No resp difficulty HEENT: normal Neck: supple. no JVD. Carotids 2+ bilat; no bruits. No lymphadenopathy or thryomegaly appreciated. Cor: PMI nondisplaced. Regular rate & rhythm. No rubs, gallops or murmurs. Lungs: clear with mildly decreased BS throughout Abdomen: soft, nontender, nondistended. No hepatosplenomegaly. No bruits or masses. Good bowel sounds. Extremities: no cyanosis, clubbing, rash, edema Neuro: alert & orientedx3, cranial nerves grossly intact. moves all 4 extremities w/o difficulty. Affect pleasant  ASSESSMENT & PLAN:  1. Chronic systolic HF  -Echo 0/81/44 w/ low EF 15% RV moderately to severely down. Suspect due to polysubstance abuse +/- tachy-induced. Cath 07/10/18 w/ normal cors. Repeat echo 10/20 showed EF 20-25%. RV systolic function mildly reduced.   - Recent admit for COPD flare. No evidence fo volume overload.  - NYHA II - Continue carvedilol6.25 bid - Continue Entresto to 97-103 mg twice a day.  - Continue spiro 25 mg daily.  - Can stop digoxin with NHYA II and as she is trying to get off some of her meds - Unable to afford [place] - Needs echo - If remains EF still < 35% and can abstain from substance abuse can consider ICD - Recent labs ok   2. Cardiac arrest VFF/TdP  - s/p Code Blue 07/06/18  with defib x 1 - cath w/ normal cors, EF 20-25% - denies palpitations, syncope/ near syncope  - See ICD discussion above. Repeat echo   3. PAF - Regular on exam. HR controlled in the 60s.  - Continue amio 200 mg daily for now. Will stop if EF improved - Continue eliquis 5 mg twice a day. Denies abnormal bleeding.    4. Polysubstance abuse/cocaine abuse - she was clean x 11 months. Now with recent relapse. Long discussion about need to abstain due to high-risk of recurrent cardaic arrest.   5. Tobacco Abuse.  -Continues to smoke 2-3 cigs/day.  -Encouraged cessation   Glori Bickers MD 12:48 PM

## 2019-10-07 NOTE — Telephone Encounter (Signed)
NOTED

## 2019-10-07 NOTE — Patient Outreach (Signed)
Care Coordination - Case Manager  10/07/2019  Sabrina Holland [birth year] [id number]  Subjective:  Sabrina Holland is an 49 y.o. year old female who is a primary patient of Gildardo Pounds, NP.  Sabrina Holland was given information about Medicaid Managed Care team care coordination services today. Sabrina Holland agreed to services and verbal consent obtained  Review of patient status, laboratory and other test data was performed as part of evaluation for provision of services.  SDOH: SDOH Screenings   Alcohol Screen:    Last Alcohol Screening Score (AUDIT): Not on file  Depression (PHQ2-9): Medium Risk   PHQ-2 Score: 10  Financial Resource Strain:    Difficulty of Paying Living Expenses: Not on file  Tobacco Use: High Risk   Smoking Tobacco Use: Current Every Day Smoker   Smokeless Tobacco Use: Never Used  Transportation Needs:    Lack of Transportation (Medical): Not on file   Lack of Transportation (Non-Medical): Not on file     Objective:    Allergies  Allergen Reactions   Penicillins Other (See Comments)    Pt had slight throat swelling Has patient had a PCN reaction causing immediate rash, facial/tongue/throat swelling, SOB or lightheadedness with hypotension: yes Has patient had a PCN reaction causing severe rash involving mucus membranes or skin necrosis: No Has patient had a PCN reaction that required hospitalization No Has patient had a PCN reaction occurring within the last 10 years: No If all of the above answers are "NO", then may proceed with Cephalosporin use.     Medications:    Medications Reviewed Today    Reviewed by Melissa Montane, RN (Registered Nurse) on 10/07/19 at [phone]  Med List Status: <None>  Medication Order Taking? Sig Documenting Provider Last Dose Status Informant  albuterol (PROVENTIL) (2.5 MG/3ML) 0.083% nebulizer solution [id number] Yes Take 3 mLs (2.5 mg total) by nebulization every 6 (six) hours as  needed for wheezing or shortness of breath. Charlott Rakes, MD Taking Active Self  albuterol (VENTOLIN HFA) 108 (90 Base) MCG/ACT inhaler [id number] Yes Inhale 1-2 puffs into the lungs every 6 (six) hours as needed for wheezing or shortness of breath (cough). Will pick up scripts tomorrow 7-9. Volney American, [place] Taking Active Self  amiodarone (PACERONE) 200 MG tablet [id number] Yes TAKE ONE TABLET BY MOUTH ONCE DAILY  Patient taking differently: Take 200 mg by mouth daily.    Bensimhon, Shaune Pascal, MD Taking Active Self  carvedilol (COREG) 6.25 MG tablet [id number] Yes Take 1 tablet (6.25 mg total) by mouth 2 (two) times daily. Bensimhon, Shaune Pascal, MD Taking Active Self  digoxin (LANOXIN) 0.125 MG tablet [id number] Yes Take 1 tablet (125 mcg total) by mouth daily. Needs appt for further refills Bensimhon, Shaune Pascal, MD Taking Active Self  ELIQUIS 5 MG TABS tablet [id number] Yes TAKE 1 TABLET BY MOUTH TWICE DAILY  Patient taking differently: Take 5 mg by mouth 2 (two) times daily.    Larey Dresser, MD Taking Active Self  ferrous sulfate 325 (65 FE) MG tablet [id number] Yes Take 1 tablet (325 mg total) by mouth 2 (two) times daily with a meal. Gildardo Pounds, NP Taking Active Self  fluticasone (FLONASE) 50 MCG/ACT nasal spray [id number] No Place 2 sprays into both nostrils daily.  Patient not taking: Reported on 10/03/2019   Eugenie Filler, MD Not Taking Active Self  folic acid (FOLVITE) 1 MG tablet [id number] Yes Take 1 tablet (1 mg total) by mouth daily. Raul Del,  Vernia Buff, NP Taking Active Self  predniSONE (DELTASONE) 20 MG tablet [id number] No Take 2 tablets (40 mg total) by mouth daily with breakfast for 3 days.  Patient not taking: Reported on 10/07/2019   Iona Beard, MD Not Taking Active            Med Note (Lenord Fralix A   Mon Oct 07, 2019  9:16 AM) Plans to pick up from the pharmacy when she gets some money  sacubitril-valsartan (ENTRESTO) 97-103 MG [id number] Yes Take 1 tablet by  mouth 2 (two) times daily. Bensimhon, Shaune Pascal, MD Taking Active Self  sertraline (ZOLOFT) 50 MG tablet [id number] Yes Take 1 tablet (50 mg total) by mouth daily. Gildardo Pounds, NP Taking Active Self  spironolactone (ALDACTONE) 25 MG tablet [id number] Yes Take 1 tablet (25 mg total) by mouth daily. F/U with Cardiology for additional refills Gildardo Pounds, NP Taking Active Self  thiamine 100 MG tablet [id number] Yes Take 1 tablet (100 mg total) by mouth daily. Gildardo Pounds, NP Taking Active Self  Vitamin D, Ergocalciferol, (DRISDOL) 1.25 MG (50000 UNIT) CAPS capsule [id number] Yes Take 1 capsule (50,000 Units total) by mouth every 7 (seven) days.  Patient taking differently: Take 50,000 Units by mouth every 7 (seven) days. Sundays   Gildardo Pounds, NP Taking Active Self          Assessment:   Goals Addressed              This Visit's Progress     "I need help finding housing" (pt-stated)        CARE PLAN ENTRY Medicaid Managed Care (see longitudinal plan of care for additional care plan information)  Current Barriers:   Knowledge Deficits related to procuring housing-Patient has been approved for section 8, however she is having difficulty finding a one bedroom in her [place].   Nurse Case Manager Clinical Goal(s):   Over the next 30 days, patient will work with CM clinical social worker to find affordable housing  Interventions:   Inter-disciplinary care team collaboration (see longitudinal plan of care)  Social Work referral for affordable housing options  Plan:   Patient will continue to look for housing  RNCM will collaborate with BSW for assistance with housing needs   Initial goal documentation       "I need to get my prescription for prednisone and to make an appointment with my heart doctor" (pt-stated)        [place] (see longitudinal plan of care for additional care plan  information)  Current Barriers:   Care Coordination needs related to procuring prescription medication and appointment with patients heart doctor. Patient stated that she could not get her medication until she got some money. Patient has not seen Dr. Haroldine Laws since January 2021 and she needs to make an appointment.  Nurse Case Manager Clinical Goal(s):   Over the next 30 days, patient will meet with RN Care Manager to address follow up with Cardiologist  Over the next 30 days, patient will demonstrate improved adherence to prescribed treatment plan for COPD as evidenced by obtaining prescribed medication and taking as prescribed   Over the next 30 days, patient will demonstrate improved health management independence as evidenced by attending all follow up appointments  Over the next 120 days, patient will experience decrease in ED visits. ED visits in last 6 months = 3  Interventions:   Inter-disciplinary care team collaboration (see  longitudinal plan of care)  Evaluation of current treatment plan related to medication and patient's adherence to plan as established by provider.  Advised patient to call Dr. Clayborne Dana office and schedule an appointment-phone number provided to patient-[phone]. Noted in appointment desk that patient has called and scheduled appt on 10/09/19  Reviewed medications with patient and discussed need to pickup prednisone-RNCM called [facility], prednisone is $3  Collaborated with pharmacy regarding cost of medication  Discussed plans with patient for ongoing care management follow up and provided patient with direct contact information for care management team  Plan:   Patient will pick up medication today and call for Cardiology appointment today  RNCM will follow up with patient within 30 days   Initial goal documentation       "I need to get off these cigarettes" (pt-stated)        [place] (see  longitudinal plan of care for additional care plan information)  Current Barriers:   Knowledge Deficits related to smoking Cessation. Patient would like to quit smoking, she is unable to use nicotine patches due to her having bad dreams while using patches in the past.  Patient is interested in exploring other options.  Nurse Case Manager Clinical Goal(s):   Over the next 30 days, patient will work with Primary Care Provider to address smoking cessation  Interventions:   Inter-disciplinary care team collaboration (see longitudinal plan of care)  Provided patient with Emmi educational materials related to smoking cessation  Notify provider of patients desire to explore options related to smoking cessation  Plan:   Patient will continue to cut down on the number of cigarettes per day  RNCM will collaborate with PCP regarding smoking cessation   Initial goal documentation        Plan:

## 2019-10-08 ENCOUNTER — Other Ambulatory Visit: Payer: Self-pay

## 2019-10-08 ENCOUNTER — Ambulatory Visit: Payer: Medicaid Other | Attending: Nurse Practitioner | Admitting: Nurse Practitioner

## 2019-10-08 ENCOUNTER — Encounter: Payer: Self-pay | Admitting: Nurse Practitioner

## 2019-10-08 DIAGNOSIS — Z09 Encounter for follow-up examination after completed treatment for conditions other than malignant neoplasm: Secondary | ICD-10-CM

## 2019-10-08 DIAGNOSIS — F32A Depression, unspecified: Secondary | ICD-10-CM

## 2019-10-08 DIAGNOSIS — E559 Vitamin D deficiency, unspecified: Secondary | ICD-10-CM | POA: Diagnosis not present

## 2019-10-08 DIAGNOSIS — J449 Chronic obstructive pulmonary disease, unspecified: Secondary | ICD-10-CM

## 2019-10-08 DIAGNOSIS — F329 Major depressive disorder, single episode, unspecified: Secondary | ICD-10-CM | POA: Diagnosis not present

## 2019-10-08 DIAGNOSIS — F419 Anxiety disorder, unspecified: Secondary | ICD-10-CM | POA: Diagnosis not present

## 2019-10-08 MED ORDER — ALBUTEROL SULFATE (2.5 MG/3ML) 0.083% IN NEBU: 2.5000 mg | INHALATION_SOLUTION | Freq: Four times a day (QID) | RESPIRATORY_TRACT | 1 refills | Status: DC | PRN

## 2019-10-08 MED ORDER — DIGOXIN 125 MCG PO TABS: 125.0000 ug | ORAL_TABLET | Freq: Every day | ORAL | 0 refills | Status: DC

## 2019-10-08 MED ORDER — SERTRALINE HCL 100 MG PO TABS: 100.0000 mg | ORAL_TABLET | Freq: Every day | ORAL | 1 refills | Status: DC

## 2019-10-08 MED ORDER — VITAMIN D (ERGOCALCIFEROL) 1.25 MG (50000 UNIT) PO CAPS: 50000.0000 [IU] | ORAL_CAPSULE | ORAL | 0 refills | Status: AC

## 2019-10-08 NOTE — Progress Notes (Signed)
Virtual Visit via Telephone Note Due to national recommendations of social distancing due to [place] 19, telehealth visit is felt to be most appropriate for this patient at this time.  I discussed the limitations, risks, security and privacy concerns of performing an evaluation and management service by telephone and the availability of in person appointments. I also discussed with the patient that there may be a patient responsible charge related to this service. The patient expressed understanding and agreed to proceed.    I connected with Sabrina Holland on 10/08/19  at   2:10 PM EDT  EDT by telephone and verified that I am speaking with the correct person using two identifiers.   Consent I discussed the limitations, risks, security and privacy concerns of performing an evaluation and management service by telephone and the availability of in person appointments. I also discussed with the patient that there may be a patient responsible charge related to this service. The patient expressed understanding and agreed to proceed.   Location of Patient: Private  Residence   Location of Provider: [facility] [employer] Office    Persons participating in Telemedicine visit: [name] FNP-BC [place]    History of Present Illness: Telemedicine visit for: HFU History of HTN, Grade 3 diastolic dysfunction with EF 20-25%, COPD, polysubstance abuse, CKD stage II, history of breast carcinoma (s/p L mastectomy, chemo, tamoxifen), anxiety, depression, and homelessness, paroxysmal afib   Admitted to the hospital on 10-04-2019 with COPD exacerbation and relapse of cocaine use after a 2 week history of worsening shortness of breath. She required BIPAP, steroids and breathing treatments. PER ED NOTE: . COVID test negative. CBC showed no leukocytosis, no fevers or infectious symptoms noted by patient. CXR did not show pulmonary edema.  Unclear exacerbating  factor for her COPD.    Today she states she is feeling much better. Upset and stating that she called the office several times for her medications and because they were not filled she ended up in the hospital. I did instruct her that there were no messages in the system with her requests. I offered to help her set up her mychart and she declined.   Needs to follow up with Cardiology.   She states she is trying to quit using drugs but keeps having relapses. Working on quitting.   Past Medical History:  Diagnosis Date   Atrial flutter ([place]) 07/03/2018   Breast cancer, stage 2 ([place]) 02/23/2011   Chronic combined systolic and diastolic CHF (congestive heart failure) (HCC)    CKD (chronic kidney disease), stage II    Cocaine use    COPD (chronic obstructive pulmonary disease) ([place])    Homelessness    Hypertension    Polysubstance abuse ([place])    Poor social situation    TIA (transient ischemic attack) 11/25/2011   "first time" (11/25/2011)    Past Surgical History:  Procedure Laterality Date   BREAST BIOPSY  2007   left   MASTECTOMY  09/2005   left   RIGHT/LEFT HEART CATH AND CORONARY ANGIOGRAPHY N/A 07/10/2018   Procedure: RIGHT/LEFT HEART CATH AND CORONARY ANGIOGRAPHY;  Surgeon: Jolaine Artist, MD;  Location: [facility];  Service: Cardiovascular;  Laterality: N/A;   TONSILLECTOMY AND ADENOIDECTOMY     "when I was little" (11/25/2011)   TUBAL LIGATION  1993    Family History  Problem Relation Age of Onset   COPD Father    Sickle cell anemia Cousin  maternal cousin    Social History   Socioeconomic History   Marital status: Single    Spouse name: Not on file   Number of children: 4   Years of education: Not on file   Highest education level: Not on file  Occupational History   Occupation: trying to get disability    Employer: [employer]  Tobacco Use   Smoking status: Current Every Day Smoker    Packs/day: 5.00    Years: 30.00     Pack years: 150.00    Types: Cigarettes   Smokeless tobacco: Never Used   Tobacco comment: 1 or 2 cigarettes a day- approx 1 pack week  Vaping Use   Vaping Use: Never used  Substance and Sexual Activity   Alcohol use: Yes    Alcohol/week: 12.0 standard drinks    Types: 12 Cans of beer per week    Comment: last drink was 5/19   Drug use: Yes    Types: Marijuana, Cocaine    Comment: daily marijuana; last use of cocaine 05/02/18 but selling it   Sexual activity: Not Currently    Birth control/protection: Surgical  Other Topics Concern   Not on file  Social History Narrative   ** Merged History Encounter **       Social Determinants of Health   Financial Resource Strain:    Difficulty of Paying Living Expenses: Not on file  Food Insecurity:    Worried About Charity fundraiser in the Last Year: Not on file   [employer] of Food in the Last Year: Not on file  Transportation Needs:    Lack of Transportation (Medical): Not on file   Lack of Transportation (Non-Medical): Not on file  Physical Activity:    Days of Exercise per Week: Not on file   Minutes of Exercise per Session: Not on file  Stress:    Feeling of Stress : Not on file  Social Connections:    Frequency of Communication with Friends and Family: Not on file   Frequency of Social Gatherings with Friends and Family: Not on file   Attends Religious Services: Not on file   Active Member of Clubs or Organizations: Not on file   Attends Archivist Meetings: Not on file   Marital Status: Not on file     Observations/Objective: Awake, alert and oriented x 3   Review of Systems  Constitutional: Negative for fever, malaise/fatigue and weight loss.  HENT: Negative.  Negative for nosebleeds.   Eyes: Negative.  Negative for blurred vision, double vision and photophobia.  Respiratory: Negative.  Negative for cough and shortness of breath.   Cardiovascular: Negative.  Negative for chest pain,  palpitations and leg swelling.  Gastrointestinal: Negative.  Negative for heartburn, nausea and vomiting.  Musculoskeletal: Negative.  Negative for myalgias.  Neurological: Negative.  Negative for dizziness, focal weakness, seizures and headaches.  Psychiatric/Behavioral: Positive for substance abuse. Negative for suicidal ideas. The patient is nervous/anxious.     Assessment and Plan: Diagnoses and all orders for this visit:  Hospital discharge follow-up  Anxiety and depression -     sertraline (ZOLOFT) 100 MG tablet; Take 1 tablet (100 mg total) by mouth daily.  Chronic obstructive pulmonary disease, unspecified COPD type (HCC) -     albuterol (PROVENTIL) (2.5 MG/3ML) 0.083% nebulizer solution; Take 3 mLs (2.5 mg total) by nebulization every 6 (six) hours as needed for wheezing or shortness of breath.  Vitamin D deficiency disease -  Vitamin D, Ergocalciferol, (DRISDOL) 1.25 MG (50000 UNIT) CAPS capsule; Take 1 capsule (50,000 Units total) by mouth every 7 (seven) days. Sundays       Follow Up Instructions Return in about 3 months (around 01/07/2020).     I discussed the assessment and treatment plan with the patient. The patient was provided an opportunity to ask questions and all were answered. The patient agreed with the plan and demonstrated an understanding of the instructions.   The patient was advised to call back or seek an in-person evaluation if the symptoms worsen or if the condition fails to improve as anticipated.  I provided 20 minutes of non-face-to-face time during this encounter including median intraservice time, reviewing previous notes, labs, imaging, medications and explaining diagnosis and management.  Gildardo Pounds, FNP-BC

## 2019-10-09 ENCOUNTER — Other Ambulatory Visit: Payer: Self-pay

## 2019-10-09 ENCOUNTER — Encounter (HOSPITAL_COMMUNITY): Payer: Self-pay | Admitting: Internal Medicine

## 2019-10-09 ENCOUNTER — Ambulatory Visit (HOSPITAL_COMMUNITY)
Admission: RE | Admit: 2019-10-09 | Discharge: 2019-10-09 | Disposition: A | Discharge status: Home / Self Care | Payer: Medicaid Other | Source: Ambulatory Visit | Attending: Internal Medicine | Admitting: Internal Medicine

## 2019-10-09 VITALS — BP 128/80 | HR 81 | Ht 64.0 in | Wt 118.4 lb

## 2019-10-09 DIAGNOSIS — E785 Hyperlipidemia, unspecified: Secondary | ICD-10-CM | POA: Diagnosis not present

## 2019-10-09 DIAGNOSIS — I5042 Chronic combined systolic (congestive) and diastolic (congestive) heart failure: Secondary | ICD-10-CM

## 2019-10-09 DIAGNOSIS — Z8674 Personal history of sudden cardiac arrest: Secondary | ICD-10-CM | POA: Insufficient documentation

## 2019-10-09 DIAGNOSIS — F17210 Nicotine dependence, cigarettes, uncomplicated: Secondary | ICD-10-CM | POA: Diagnosis not present

## 2019-10-09 DIAGNOSIS — I340 Nonrheumatic mitral (valve) insufficiency: Secondary | ICD-10-CM | POA: Insufficient documentation

## 2019-10-09 DIAGNOSIS — Z79899 Other long term (current) drug therapy: Secondary | ICD-10-CM | POA: Insufficient documentation

## 2019-10-09 DIAGNOSIS — I480 Paroxysmal atrial fibrillation: Secondary | ICD-10-CM | POA: Diagnosis not present

## 2019-10-09 DIAGNOSIS — N182 Chronic kidney disease, stage 2 (mild): Secondary | ICD-10-CM | POA: Insufficient documentation

## 2019-10-09 DIAGNOSIS — Z720 Tobacco use: Secondary | ICD-10-CM | POA: Diagnosis not present

## 2019-10-09 DIAGNOSIS — I130 Hypertensive heart and chronic kidney disease with heart failure and stage 1 through stage 4 chronic kidney disease, or unspecified chronic kidney disease: Secondary | ICD-10-CM | POA: Diagnosis present

## 2019-10-09 DIAGNOSIS — J449 Chronic obstructive pulmonary disease, unspecified: Secondary | ICD-10-CM | POA: Diagnosis not present

## 2019-10-09 DIAGNOSIS — Z853 Personal history of malignant neoplasm of breast: Secondary | ICD-10-CM | POA: Insufficient documentation

## 2019-10-09 DIAGNOSIS — Z7901 Long term (current) use of anticoagulants: Secondary | ICD-10-CM | POA: Diagnosis not present

## 2019-10-09 DIAGNOSIS — Z8673 Personal history of transient ischemic attack (TIA), and cerebral infarction without residual deficits: Secondary | ICD-10-CM | POA: Diagnosis not present

## 2019-10-09 DIAGNOSIS — F1910 Other psychoactive substance abuse, uncomplicated: Secondary | ICD-10-CM | POA: Insufficient documentation

## 2019-10-09 DIAGNOSIS — F1410 Cocaine abuse, uncomplicated: Secondary | ICD-10-CM | POA: Diagnosis not present

## 2019-10-09 NOTE — Patient Instructions (Signed)
STOP Digoxin  Your physician has requested that you have an echocardiogram. Echocardiography is a painless test that uses sound waves to create images of your heart. It provides your doctor with information about the size and shape of your heart and how well your heart's chambers and valves are working. This procedure takes approximately one hour. There are no restrictions for this procedure.  Please follow up with our clinic in 3-4 months  If you have any questions or concerns before your next appointment please send [place] a message through [place] or call our office at [phone].    TO LEAVE A MESSAGE FOR THE NURSE SELECT OPTION 2, PLEASE LEAVE A MESSAGE INCLUDING: . YOUR NAME . DATE OF BIRTH . CALL BACK NUMBER . REASON FOR CALL**this is important as we prioritize the call backs  [place] AS LONG AS YOU CALL BEFORE 4:00 PM  At the [facility], you and your health needs are our priority. As part of our continuing mission to provide you with exceptional heart care, we have created designated Provider Care Teams. These Care Teams include your primary Cardiologist (physician) and Advanced Practice Providers (APPs- Physician Assistants and Nurse Practitioners) who all work together to provide you with the care you need, when you need it.   You may see any of the following providers on your designated Care Team at your next follow up: Marland Kitchen Dr Glori Bickers . Dr Loralie Champagne . Darrick Grinder, NP . Lyda Jester, PA . Audry Riles, PharmD   Please be sure to bring in all your medications bottles to every appointment.

## 2019-10-09 NOTE — Discharge Summary (Signed)
Name: Sabrina Holland MRN: [id number] DOB: 1970-10-07 49 y.o. PCP: Gildardo Pounds, NP  Date of Admission: 10/03/2019  6:00 AM Date of Discharge: 10/04/2019 Attending Physician: No att. providers found  Discharge Diagnosis: 1. COPD exacerbation  Discharge Medications: Allergies as of 10/04/2019      Reactions   Penicillins Other (See Comments)   Pt had slight throat swelling Has patient had a PCN reaction causing immediate rash, facial/tongue/throat swelling, SOB or lightheadedness with hypotension: yes Has patient had a PCN reaction causing severe rash involving mucus membranes or skin necrosis: No Has patient had a PCN reaction that required hospitalization No Has patient had a PCN reaction occurring within the last 10 years: No If all of the above answers are "NO", then may proceed with Cephalosporin use.      Medication List    TAKE these medications   albuterol 108 (90 Base) MCG/ACT inhaler Commonly known as: VENTOLIN HFA Inhale 1-2 puffs into the lungs every 6 (six) hours as needed for wheezing or shortness of breath (cough). Will pick up scripts tomorrow 7-9.   amiodarone 200 MG tablet Commonly known as: PACERONE TAKE ONE TABLET BY MOUTH ONCE DAILY   carvedilol 6.25 MG tablet Commonly known as: COREG Take 1 tablet (6.25 mg total) by mouth 2 (two) times daily.   Eliquis 5 MG Tabs tablet Generic drug: apixaban TAKE 1 TABLET BY MOUTH TWICE DAILY What changed: how much to take   Entresto 97-103 MG Generic drug: sacubitril-valsartan Take 1 tablet by mouth 2 (two) times daily.   ferrous sulfate 325 (65 FE) MG tablet Take 1 tablet (325 mg total) by mouth 2 (two) times daily with a meal.   fluticasone 50 MCG/ACT nasal spray Commonly known as: FLONASE Place 2 sprays into both nostrils daily.   folic acid 1 MG tablet Commonly known as: FOLVITE Take 1 tablet (1 mg total) by mouth daily.   spironolactone 25 MG tablet Commonly known as: ALDACTONE Take 1  tablet (25 mg total) by mouth daily. F/U with Cardiology for additional refills   thiamine 100 MG tablet Take 1 tablet (100 mg total) by mouth daily.     ASK your doctor about these medications   predniSONE 20 MG tablet Commonly known as: DELTASONE Take 2 tablets (40 mg total) by mouth daily with breakfast for 3 days. Ask about: Should I take this medication?       Disposition and follow-up:   Ms.Kameisha Ripley Lovecchio was discharged from [facility] in Stable condition.  At the hospital follow up visit please address:  1.  COPD:  Patient improved and stable on room air discharged on prednisone. Please evaluate for resolution dyspnea and possible need for maintaince inhalers if she continues to be symptomatic.  Substance use disorder: Patient previously with recent relapse with cigarette cocaine use which she feels has worsened her breathing. She would like to quit but has difficulty given her living environment. May benefit from continued counseling and outpatient resources.   2.  Labs / imaging needed at time of follow-up:  CBC, BMP  3.  Pending labs/ test needing follow-up: none  Follow-up Appointments:  Follow-up Information    [facility]. Go on 10/30/2019.   Why: Please attend your hospital follow up appt with Freeman Caldron, PA on Wed, Octover 13, at 1030am. Contact information: [address] [place] [address] [phone]  Hospital Course by problem list: 1. Acute hypoxic respiratory failure secondary to COPD exacerbation This is a 49 year old female with a history of hypertension, heart failure with reduced ejection fraction(last EF 20-25%, grade 3 DD), COPD, polysubstance abuse(cocaine, opioids, THC, tobacco, and alcohol), CKD stage II, history of breast carcinoma (s/p L mastectomy, chemo, tamoxifen), anxiety, depression, and homelessness, paroxysmal afib presenting with a 2-week  history of worsening shortness of breath with wheezing, productive cough, decreased appetite, and DOE. Increasing her use of albuterol inhalers. Chest x-ray showed severely hyperinflated lungs, no cardiomegaly or acute infiltrates noted. Patient noted to start having lethargy in the ER, VBG is obtained and patient placed on BiPAP and given albuterol, magnesium, and, with some improvement in her mental status. COVID test negative. CBC showed no leukocytosis, no fevers or infectious symptoms noted by patient. Patient believes reascent  exacerbating factor for her COPD.   Heart failure with reduced EF: Last echo showed EF 37-62%, grade 3 diastolic dysfunction. Follows with the heart care clinic and is currently prescribed goal-directed therapy. On Coreg 6.25 mg twice daily, Entresto 97-103 mg twice daily, spironolactone 25 mg daily, digoxin 0.125 mg daily. She last saw cardiology on 01/31/2019, missed her follow-up appointment. Last note from then noted considering ICD placement. Patient does not appear to be volume overloaded on exam, BP stable with no narrowed pulse pressure, CXR showed no pulmonary edema. Did receive 1 dose IV lasix for her hyperkalemia. Given the COPD and improvement with steroids and breathing treatment a COPD exacerbation seems to be the cause of her symptoms. Contacted cardiology regarding patient, advised to contact them when patient is close to discharge to set up outpatient follow up regarding her stable heart failure.   Hyperkalemia: Potassium mildly elevated at 5.2 with hemolysis noted on admission. PT was noted on EKG and patient given Lokelma and Lasix in the ER. Resolved to 4.1 overnight and discharged with K of 3.4.  Paroxysmal atrial fibrillation: Currently on amiodarone 200 mg daily at home and Eliquis 5 mg twice daily. She reports taking her medications as prescribed however states she will sometimes miss them. EKG today showed NSR. Continued on home  medications.  Polysubstance abuse: Endorsed cocaine, THC, alcohol, and smoking use. Reports last cocaine use was 1 day prior to admission. Also has a very poor living situation, currently is unhomed. Approved for section 8 housing and currently looking for a house. Counseled on tobacco and cocaine cessation. She is interested in quitting but feels here home situation makes it difficult for her. May benefit from outpatient cessation program.   HTN:  BP well controlled here. Initially hypertensive up to  Resumed home BP medications as above.   Anxiety/Depression: Resumed home sertraline.   Discharge Vitals:   BP 113/84 (BP Location: Right Arm)   Pulse 74   Temp 97.8 F (36.6 C) (Oral)   Resp 14   LMP 02/18/2015 (Exact Date) Comment: MAYBE ONCE A YEAR  SpO2 93%   Pertinent Labs, Studies, and Procedures:  CLINICAL DATA:  Shortness of breath  EXAM: PORTABLE CHEST 1 VIEW  COMPARISON:  09/24/2019  FINDINGS: Large lung volumes. There is no edema, consolidation, effusion, or pneumothorax. Borderline heart size which is distorted by rightward rotation. Negative aortic contours. Artifact from EKG leads.  IMPRESSION: Emphysema without acute superimposed finding.   Electronically Signed   By: Monte Fantasia M.D.   On: 10/03/2019 06:47   Discharge Instructions: Discharge Instructions    Diet - low sodium heart healthy   Complete  by: As directed    Discharge instructions   Complete by: As directed    You were hospitalized for COPD exacerbation. Thank you for allowing [place] to be part of your care.    Please follow up with the following providers: Cardiologist Dr. Sallyanne Kuster as soon as possible  Gildardo Pounds, NP  Please note these changes made to your medications:   - Medications to continue: Please continue your normal home medications  - Medications to start: Start Prednisone 40 mg daily for 3 days starting tomorrow   Please make sure to follow up with your  cardiologist and PCP as soon as possible  Please call our clinic if you have any questions or concerns, we may be able to help and keep you from a long and expensive emergency room wait. Our clinic and after hours phone number is [phone], the best time to call is Monday through Friday 9 am to 4 pm but there is always someone available 24/7 if you have an emergency. If you need medication refills please notify your pharmacy one week in advance and they will send [place] a request.   Increase activity slowly   Complete by: As directed       Signed: Iona Beard, MD 10/09/2019, 10:38 AM   Pager: [phone]

## 2019-10-09 NOTE — Addendum Note (Signed)
Encounter addended by: Jolaine Artist, MD on: 10/09/2019 2:07 PM  Actions taken: Level of Service modified, Visit diagnoses modified

## 2019-10-14 ENCOUNTER — Encounter: Payer: Self-pay | Admitting: Nurse Practitioner

## 2019-10-14 ENCOUNTER — Other Ambulatory Visit: Payer: Self-pay

## 2019-10-14 NOTE — Patient Outreach (Signed)
Care Coordination  10/14/2019  [place] [birth year] [id number]  An unsuccessful telephone outreach was attempted today. The patient was referred to the case management team for assistance with care management and care coordination.   Follow Up Plan: Social Worker will make another attempt on 10/15/19 at 2:00 pm..   Mickel Fuchs, BSW, [place] Medicaid Team  [phone]

## 2019-10-14 NOTE — Patient Instructions (Signed)
Visit Information  Ms. Melady Chow  - as a part of your Medicaid benefit, you are eligible for care management and care coordination services at no cost or copay. I was unable to reach you by phone today but would be happy to help you with your health related needs. Please feel free to call me at [phone].   A member of the Managed Medicaid care management team will reach out to you again over the next 7 days.   Mickel Fuchs, BSW, [place] Managed Medicaid Team  [phone]

## 2019-10-15 ENCOUNTER — Ambulatory Visit (HOSPITAL_COMMUNITY): Payer: Medicaid Other | Attending: Internal Medicine

## 2019-10-15 ENCOUNTER — Other Ambulatory Visit: Payer: Self-pay

## 2019-10-15 NOTE — Patient Outreach (Signed)
Care Coordination  10/15/2019  Sabrina Holland Sabrina Holland 18-Jul-1970 [id number]  A second unsuccessful telephone outreach was attempted today. The patient was referred to the case management team for assistance with care management and care coordination.   Follow Up Plan: Social Worker will follow up in 7 days.Mickel Fuchs, BSW, [place] Managed Medicaid Team  [phone]

## 2019-10-15 NOTE — Patient Instructions (Signed)
Visit Information  [name]  - as a part of your Medicaid benefit, you are eligible for care management and care coordination services at no cost or copay. I was unable to reach you by phone today but would be happy to help you with your health related needs. Please feel free to call me at [phone].   A member of the Managed Medicaid care management team will reach out to you again over the next 7 days.   Mickel Fuchs, BSW, [place] Managed Medicaid Team  [phone]

## 2019-10-18 ENCOUNTER — Other Ambulatory Visit: Payer: Medicaid Other

## 2019-10-23 ENCOUNTER — Other Ambulatory Visit: Payer: Self-pay

## 2019-10-24 NOTE — Patient Instructions (Signed)
Visit Information  Ms. Roslin Norwood  - as a part of your Medicaid benefit, you are eligible for care management and care coordination services at no cost or copay. I was unable to reach you by phone today but would be happy to help you with your health related needs. Please feel free to call me at [phone].   A member of the Managed Medicaid care management team will reach out to you again over the next 7 days.   Mickel Fuchs, BSW, [place]  High Risk Managed Medicaid Team

## 2019-10-24 NOTE — Patient Outreach (Signed)
Care Coordination  10/24/2019  Chastity Noland [place] 03/10/1970 [id number]   A second unsuccessful telephone outreach was attempted today. The patient was referred to the case management team for assistance with care management and care coordination.   Follow Up Plan: Social Worker will follow up in 7 days.   Mickel Fuchs, BSW, [place]  High Risk Managed Medicaid Team

## 2019-10-30 ENCOUNTER — Inpatient Hospital Stay: Payer: Medicaid Other | Admitting: Physician Assistant

## 2019-11-01 ENCOUNTER — Other Ambulatory Visit: Payer: Self-pay

## 2019-11-01 NOTE — Patient Instructions (Signed)
Visit Information  Sabrina Holland was given information about Medicaid Managed Care team care coordination services as a part of their Healthy [facility] Medicaid benefit. [name] verbally consented to engagement with the [facility] Managed Care team.   For questions related to your Healthy [facility] health plan, please call: [phone] or visit the homepage here: [email or web address]  If you would like to schedule transportation through your Healthy [facility] plan, please call the following number at least 2 days in advance of your appointment: [phone]    The Managed Medicaid care management team is available to follow up with the patient after provider conversation with the patient regarding recommendation for care management engagement and subsequent re-referral to the care management team.   Mickel Fuchs, BSW, [place]  High Risk Managed Medicaid Team

## 2019-11-01 NOTE — Patient Outreach (Signed)
Care Coordination  11/01/2019  Sabrina Holland 1970/08/09 [id number]  Third unsuccessful telephone outreach was attempted today. The patient was referred to the case management team for assistance with care management and care coordination. The patient's primary care provider has been notified of our unsuccessful attempts to make or maintain contact with the patient. The care management team is pleased to engage with this patient at any time in the future should he/she be interested in assistance from the care management team.   Follow Up Plan: The Managed Medicaid care management team is available to follow up with the patient after provider conversation with the patient regarding recommendation for care management engagement and subsequent re-referral to the care management team.  Mickel Fuchs, BSW, [place]  High Risk Managed Medicaid Team

## 2019-11-06 ENCOUNTER — Other Ambulatory Visit: Payer: Self-pay

## 2019-11-06 ENCOUNTER — Other Ambulatory Visit: Payer: Self-pay | Admitting: *Deleted

## 2019-11-06 NOTE — Patient Instructions (Addendum)
Visit Information  Sabrina Holland was given information about Medicaid Managed Care team care coordination services as a part of their [facility] Medicaid benefit. Sabrina Holland verbally consented to engagement with the  [facility] Managed Care team.   For questions related to your [facility] health plan, please call: [phone] or visit the homepage here: [email or web address]  If you would like to schedule transportation through your [facility] [place] plan, please call the following number at least 2 days in advance of your appointment: [phone]  Goals Addressed              This Visit's Progress   .  "I need help finding housing" (pt-stated)        Sabrina Holland Medicaid Managed Care (see longitudinal plan of care for additional care plan information)  Current Barriers:  Sabrina Holland related to procuring housing-[place] has been approved for section 8, however she is having difficulty finding a one bedroom in her budget. Holland continues to look for housing and has filed for an extension. 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   Nurse Case Manager Clinical Goal(s):  Sabrina Kitchen Over the next 30 days, Holland will work with CM clinical social worker to find affordable housing  Interventions:  . Inter-disciplinary care team collaboration (see longitudinal plan of care) . Social Work referral for affordable housing options . Sabrina Holland located three affordable housing options for the [place]. [place], [employer] [phone], and [place] [phone].  Plan:  . Holland will continue to look for housing . Sabrina Holland will mail AVS with Sabrina Holland notes for housing options . Sabrina Holland will collaborate with Sabrina Holland for [place] to get resources. Sabrina Holland.   Please see past updates related to this goal by clicking on  the "Past Updates" button in the selected goal      .  "I need to get my prescription for prednisone and to make an appointment with my heart doctor" (pt-stated)        Sabrina Holland (see longitudinal plan of care for additional care plan information)  Current Barriers:  . Care Coordination needs related to procuring prescription medication and appointment with patients heart doctor. Holland stated that she could not get her medication until she got some money. Holland has not seen Sabrina Holland since January 2021 and she needs to make an appointment.-Met, appointment with Sabrina Holland on 10/09/19, reports filling and taking prednisone prescription.  Nurse Case Manager Clinical Goal(s):  Sabrina Kitchen Over the next 30 days, Holland will meet with RN Care Manager to address follow up with Cardiologist-Met . Over the next 30 days, Holland will demonstrate improved adherence to prescribed treatment plan for COPD as evidenced by obtaining prescribed medication and taking as prescribed -Met . Over the next 30 days, Holland will demonstrate improved health management independence as evidenced by attending all follow up appointments-Met . Over the next 30 days, Holland will experience decrease in ED visits. ED visits in last 6 months = 3-No ED visits in the last 30 days.  Interventions:  . Inter-disciplinary care team collaboration (see longitudinal plan of care) . Evaluation of current treatment plan related to medication and Holland's adherence to plan as established by provider. . Advised Holland to call Sabrina Holland office and schedule an appointment-phone number provided to Holland-[phone]. Noted in appointment desk that Holland has called and scheduled appt on 10/09/19 . Reviewed  medications with Holland and discussed need to pickup prednisone-Sabrina Holland called Sabrina Holland, prednisone is $3 . Collaborated with Holland regarding cost of medication . Discussed plans with Holland  for ongoing care management follow up and provided Holland with direct contact information for care management team  Plan:  . Holland will pick up medication today and call for Cardiology appointment today . Sabrina Holland will follow up with Holland within 30 days   Please see past updates related to this goal by clicking on the "Past Updates" button in the selected goal      .  "I need to get off these cigarettes" (pt-stated)        Sabrina Holland (see longitudinal plan of care for additional care plan information)  Current Barriers:  Sabrina Holland related to smoking Cessation. Holland would like to quit smoking, she is unable to use nicotine patches due to her having bad dreams while using patches in the past.  Holland is interested in exploring other options.Holland reports cutting down on the number of cigarettes daily to 3-4 per day. She reports feeling better and notices a decreased need for inhalers.  Nurse Case Manager Clinical Goal(s):  Sabrina Kitchen Over the next 30 days, [place] will work with Primary Care Provider to address smoking cessation  Interventions:  . Inter-disciplinary care team collaboration (see longitudinal plan of care) . Provided [place] with [facility] educational materials related to smoking cessation . Notify provider of patients desire to explore options related to smoking cessation . Provided information for smoking cessation offered by [employer].  Plan:  . Holland will continue to cut down on the number of cigarettes per day . Sabrina Holland will follow up with a telephone visit within 30 days to check on the progress of smoking cessation.   Please see past updates related to this goal by clicking on the "Past Updates" button in the selected goal      .  "I want to improve my health." (pt-stated)        Sabrina Holland (see longitudinal plan of care for additional care plan information)  Current Barriers:  . Chronic Disease  Management support and education needs related to COPD and CHF  Nurse Case Manager Clinical Goal(s):  Sabrina Kitchen Over the next 30 days, [place] will meet with RN Care Manager to address smoking cessation, medications and improving her health . Over the next 120 days, Holland will attend all scheduled medical appointments: PCP on 01/07/20 and Cardiologist on 02/04/20 . Over the next 120 days, Holland will experience decrease in ED visits. ED visits in last 6 months = 3  Interventions:  . Inter-disciplinary care team collaboration (see longitudinal plan of care) . Advised Holland to call [facility] and inquire about smoking cessation program . Reviewed medications with Holland and discussed taking prescribed medications as instructed . Discussed plans with Holland for ongoing care management follow up and provided Holland with direct contact information for care management team . Provided Holland with printed educational materials related to COPD and CHF . Reviewed scheduled/upcoming provider appointments including:01/07/20 with [name] and 02/04/20 with Sabrina Holland  Plan:  . Holland will attend all scheduled appointments, take medications as prescribed and work on decreasing number of cigarettes smoked daily . Sabrina Holland will follow up with a telephone visit on 12/10/19 @ 10:30am   Initial goal documentation        Please see education materials related to COPD and CHF provided as print materials.  Chronic Obstructive Pulmonary Disease Chronic obstructive pulmonary disease (COPD) is a long-term (chronic) lung problem. When you have COPD, it is hard for air to get in and out of your lungs. Usually the condition gets worse over time, and your lungs will never return to normal. There are things you can do to keep yourself as healthy as possible.  Your doctor may treat your condition with: ? Medicines. ? Oxygen. ? Lung surgery.  Your doctor may also recommend: ? Rehabilitation. This  includes steps to make your body work better. It may involve a team of specialists. ? Quitting smoking, if you smoke. ? Exercise and changes to your diet. ? Comfort measures (palliative care). Follow these instructions at home: Medicines  Take over-the-counter and prescription medicines only as told by your doctor.  Talk to your doctor before taking any cough or allergy medicines. You may need to avoid medicines that cause your lungs to be dry. Lifestyle  If you smoke, stop. Smoking makes the problem worse. If you need help quitting, ask your doctor.  Avoid being around things that make your breathing worse. This may include smoke, chemicals, and fumes.  Stay active, but remember to rest as well.  Learn and use tips on how to relax.  Make sure you get enough sleep. Most adults need at least 7 hours of sleep every night.  Eat healthy foods. Eat smaller meals more often. Rest before meals. Controlled breathing Learn and use tips on how to control your breathing as told by your doctor. Try:  Breathing in (inhaling) through your nose for 1 second. Then, pucker your lips and breath out (exhale) through your lips for 2 seconds.  Putting one hand on your belly (abdomen). Breathe in slowly through your nose for 1 second. Your hand on your belly should move out. Pucker your lips and breathe out slowly through your lips. Your hand on your belly should move in as you breathe out.  Controlled coughing Learn and use controlled coughing to clear mucus from your lungs. Follow these steps: 1. Lean your head a little forward. 2. Breathe in deeply. 3. Try to hold your breath for 3 seconds. 4. Keep your mouth slightly open while coughing 2 times. 5. Spit any mucus out into a tissue. 6. Rest and do the steps again 1 or 2 times as needed. General instructions  Make sure you get all the shots (vaccines) that your doctor recommends. Ask your doctor about a flu shot and a pneumonia shot.  Use  oxygen therapy and pulmonary rehabilitation if told by your doctor. If you need home oxygen therapy, ask your doctor if you should buy a tool to measure your oxygen level (oximeter).  Make a COPD action plan with your doctor. This helps you to know what to do if you feel worse than usual.  Manage any other conditions you have as told by your doctor.  Avoid going outside when it is very hot, cold, or humid.  Avoid people who have a sickness you can catch (contagious).  Keep all follow-up visits as told by your doctor. This is important. Contact a doctor if:  You cough up more mucus than usual.  There is a change in the color or thickness of the mucus.  It is harder to breathe than usual.  Your breathing is faster than usual.  You have trouble sleeping.  You need to use your medicines more often than usual.  You have trouble doing your normal activities such as getting  dressed or walking around the house. Get help right away if:  You have shortness of breath while resting.  You have shortness of breath that stops you from: ? Being able to talk. ? Doing normal activities.  Your chest hurts for longer than 5 minutes.  Your skin color is more blue than usual.  Your pulse oximeter shows that you have low oxygen for longer than 5 minutes.  You have a fever.  You feel too tired to breathe normally. Summary  Chronic obstructive pulmonary disease (COPD) is a long-term lung problem.  The way your lungs work will never return to normal. Usually the condition gets worse over time. There are things you can do to keep yourself as healthy as possible.  Take over-the-counter and prescription medicines only as told by your doctor.  If you smoke, stop. Smoking makes the problem worse. This information is not intended to replace advice given to you by your health care provider. Make sure you discuss any questions you have with your health care provider. Document Revised: 12/16/2016  Document Reviewed: 02/08/2016 Else[employer] [place] Education  2020 [employer].    Heart Failure, Self Care Heart failure is a serious condition. This sheet explains things you need to do to take care of yourself at home. To help you stay as healthy as possible, you may be asked to change your diet, take certain medicines, and make other changes in your life. Your doctor may also give you more specific instructions. If you have problems or questions, call your doctor. What are the risks? Having heart failure makes it more likely for you to have some problems. These problems can get worse if you do not take good care of yourself. Problems may include:  Blood clotting problems. This may cause a stroke.  Damage to the kidneys, liver, or lungs.  Abnormal heart rhythms. Supplies needed:  Scale for weighing yourself.  Blood pressure monitor.  Notebook.  Medicines. How to care for yourself when you have heart failure Medicines Take over-the-counter and prescription medicines only as told by your doctor. Take your medicines every day.  Do not stop taking your medicine unless your doctor tells you to do so.  Do not skip any medicines.  Get your prescriptions refilled before you run out of medicine. This is important. Eating and drinking   Eat heart-healthy foods. Talk with a diet specialist (dietitian) to create an eating plan.  Choose foods that: ? Have no trans fat. ? Are low in saturated fat and cholesterol.  Choose healthy foods, such as: ? Fresh or frozen fruits and vegetables. ? Fish. ? Low-fat (lean) meats. ? Legumes, such as beans, peas, and lentils. ? Fat-free or low-fat dairy products. ? Whole-grain foods. ? High-fiber foods.  Limit salt (sodium) if told by your doctor. Ask your diet specialist to tell you which seasonings are healthy for your heart.  Cook in healthy ways instead of frying. Healthy ways of cooking include roasting, grilling, broiling, baking,  poaching, steaming, and stir-frying.  Limit how much fluid you drink, if told by your doctor. Alcohol use  Do not drink alcohol if: ? Your doctor tells you not to drink. ? Your heart was damaged by alcohol, or you have very bad heart failure. ? You are pregnant, may be pregnant, or are planning to become pregnant.  If you drink alcohol: ? Limit how much you use to:  0-1 drink a day for women.  0-2 drinks a day for men. ? Be aware  of how much alcohol is in your drink. In the [place] one drink equals one 12 oz bottle of beer (355 mL), one 5 oz glass of wine (148 mL), or one 1 oz glass of hard liquor (44 mL). Lifestyle   Do not use any products that contain nicotine or tobacco, such as cigarettes, e-cigarettes, and chewing tobacco. If you need help quitting, ask your doctor. ? Do not use nicotine gum or patches before talking to your doctor.  Do not use illegal drugs.  Lose weight if told by your doctor.  Do physical activity if told by your doctor. Talk to your doctor before you begin an exercise if: ? You are an older adult. ? You have very bad heart failure.  Learn to manage stress. If you need help, ask your doctor.  Get rehab (rehabilitation) to help you stay independent and to help with your quality of life.  Plan time to rest when you get tired. Check weight and blood pressure   Weigh yourself every day. This will help you to know if fluid is building up in your body. ? Weigh yourself every morning after you pee (urinate) and before you eat breakfast. ? Wear the same amount of clothing each time. ? Write down your daily weight. Give your record to your doctor.  Check and write down your blood pressure as told by your doctor.  Check your pulse as told by your doctor. Dealing with very hot and very cold weather  If it is very hot: ? Avoid activities that take a lot of energy. ? Use air conditioning or fans, or find a cooler place. ? Avoid caffeine and  alcohol. ? Wear clothing that is loose-fitting, lightweight, and light-colored.  If it is very cold: ? Avoid activities that take a lot of energy. ? Layer your clothes. ? Wear mittens or gloves, a hat, and a scarf when you go outside. ? Avoid alcohol. Follow these instructions at home:  Stay up to date with shots (vaccines). Get pneumococcal and flu (influenza) shots.  Keep all follow-up visits as told by your doctor. This is important. Contact a doctor if:  You gain weight quickly.  You have increasing shortness of breath.  You cannot do your normal activities.  You get tired easily.  You cough a lot.  You don't feel like eating or feel like you may vomit (nauseous).  You become puffy (swell) in your hands, feet, ankles, or belly (abdomen).  You cannot sleep well because it is hard to breathe.  You feel like your heart is beating fast (palpitations).  You get dizzy when you stand up. Get help right away if:  You have trouble breathing.  You or someone else notices a change in your behavior, such as having trouble staying awake.  You have chest pain or discomfort.  You pass out (faint). These symptoms may be an emergency. Do not wait to see if the symptoms will go away. Get medical help right away. Call your local emergency services (911 in the [place] Do not drive yourself to the hospital. Summary  Heart failure is a serious condition. To care for yourself, you may have to change your diet, take medicines, and make other lifestyle changes.  Take your medicines every day. Do not stop taking them unless your doctor tells you to do so.  Eat heart-healthy foods, such as fresh or frozen fruits and vegetables, fish, lean meats, legumes, fat-free or low-fat dairy products, and whole-grain or high-fiber foods.  Ask your doctor if you can drink alcohol. You may have to stop alcohol use if you have very bad heart failure.  Contact your doctor if you gain weight quickly or  feel that your heart is beating too fast. Get help right away if you pass out, or have chest pain or trouble breathing. This information is not intended to replace advice given to you by your health care provider. Make sure you discuss any questions you have with your health care provider. Document Revised: 04/17/2018 Document Reviewed: 04/18/2018 [employer] [place] Education  [employer].   The [place] verbalized understanding of instructions provided today and agreed to receive a mailed copy of [place] instruction and/or educational materials.  Telephone follow up appointment with Managed Medicaid care management team member scheduled for:12/10/19 @ 10:30am  Lurena Joiner RN, [place] RN Care Coordinator

## 2019-11-06 NOTE — Patient Outreach (Addendum)
Care Coordination - Case Manager  11/06/2019  Sabrina Holland [birth year] [id number]  Subjective:  Sabrina Holland is an 49 y.o. year old female who is a primary patient of Gildardo Pounds, NP.  Ms. Sensabaugh was given information about Medicaid Managed Care team care coordination services today. [place] Caster Tinnon agreed to services and verbal consent obtained  Review of patient status, laboratory and other test data was performed as part of evaluation for provision of services.  SDOH: SDOH Screenings   Alcohol Screen:   . Last Alcohol Screening Score (AUDIT): Not on file  Depression (PHQ2-9): Medium Risk  . PHQ-2 Score: 10  Financial Resource Strain:   . Difficulty of Paying Living Expenses: Not on file  Food Insecurity: No Food Insecurity  . Worried About Charity fundraiser in the Last Year: Never true  . Ran Out of Food in the Last Year: Never true  Housing:   . Last Housing Risk Score: Not on file  Physical Activity: Insufficiently Active  . Days of Exercise per Week: 5 days  . Minutes of Exercise per Session: 10 min  Stress:   . Feeling of Stress : Not on file  Tobacco Use: High Risk  . Smoking Tobacco Use: Current Every Day Smoker  . Smokeless Tobacco Use: Never Used  Transportation Needs: Unmet Transportation Needs  . Lack of Transportation (Medical): Yes  . Lack of Transportation (Non-Medical): Yes   SDOH Interventions     Most Recent Value  SDOH Interventions  Food Insecurity Interventions Intervention Not Indicated  Transportation Interventions Other (Comment)  [Patient aware of [employer] transportation to medical appointments. Informed patient of benefit for transportation 10x/yr to personal appointments]      Objective:    Allergies  Allergen Reactions  . Penicillins Other (See Comments)    Pt had slight throat swelling Has patient had a PCN reaction causing immediate rash, facial/tongue/throat swelling, SOB or  lightheadedness with hypotension: yes Has patient had a PCN reaction causing severe rash involving mucus membranes or skin necrosis: No Has patient had a PCN reaction that required hospitalization No Has patient had a PCN reaction occurring within the last 10 years: No If all of the above answers are "NO", then may proceed with Cephalosporin use.     Medications:    Medications Reviewed Today    Reviewed by Melissa Montane, RN (Registered Nurse) on 11/06/19 at [phone]  Med List Status: <None>  Medication Order Taking? Sig Documenting Provider Last Dose Status Informant  albuterol (PROVENTIL) (2.5 MG/3ML) 0.083% nebulizer solution [id number] Yes Take 3 mLs (2.5 mg total) by nebulization every 6 (six) hours as needed for wheezing or shortness of breath. Gildardo Pounds, NP Taking Active   albuterol (VENTOLIN HFA) 108 (90 Base) MCG/ACT inhaler [id number] Yes Inhale 1-2 puffs into the lungs every 6 (six) hours as needed for wheezing or shortness of breath (cough). Will pick up scripts tomorrow 7-9. Volney American, [place] Taking Active Self  amiodarone (PACERONE) 200 MG tablet [id number] Yes TAKE ONE TABLET BY MOUTH ONCE DAILY Bensimhon, Shaune Pascal, MD Taking Active Self  carvedilol (COREG) 6.25 MG tablet [id number] Yes Take 1 tablet (6.25 mg total) by mouth 2 (two) times daily. Bensimhon, Shaune Pascal, MD Taking Active Self  ELIQUIS 5 MG TABS tablet [id number] Yes TAKE 1 TABLET BY MOUTH TWICE DAILY  Patient taking differently: Take 5 mg by mouth 2 (two) times daily.    Larey Dresser, MD Taking Active Self  ferrous sulfate 325 (65 FE) MG tablet [id number] Yes Take 1 tablet (325 mg total) by mouth 2 (two) times daily with a meal. Gildardo Pounds, NP Taking Active Self  fluticasone (FLONASE) 50 MCG/ACT nasal spray [id number] No Place 2 sprays into both nostrils daily.  Patient not taking: Reported on 10/03/2019   Eugenie Filler, MD Not Taking Active Self  folic acid (FOLVITE) 1 MG tablet [id number] Yes  Take 1 tablet (1 mg total) by mouth daily. Gildardo Pounds, NP Taking Active Self  sacubitril-valsartan (ENTRESTO) 97-103 MG [id number] Yes Take 1 tablet by mouth 2 (two) times daily. Bensimhon, Shaune Pascal, MD Taking Active Self  sertraline (ZOLOFT) 100 MG tablet [id number] Yes Take 1 tablet (100 mg total) by mouth daily. Gildardo Pounds, NP Taking Active   spironolactone (ALDACTONE) 25 MG tablet [id number] Yes Take 1 tablet (25 mg total) by mouth daily. F/U with Cardiology for additional refills Gildardo Pounds, NP Taking Active Self  thiamine 100 MG tablet [id number] Yes Take 1 tablet (100 mg total) by mouth daily. Gildardo Pounds, NP Taking Active Self  Vitamin D, Ergocalciferol, (DRISDOL) 1.25 MG (50000 UNIT) CAPS capsule [id number] Yes Take 1 capsule (50,000 Units total) by mouth every 7 (seven) days. Sundays Gildardo Pounds, NP Taking Active           Assessment:   Goals Addressed              This Visit's Progress   .  "I need help finding housing" (pt-stated)        [place] Medicaid Managed Care (see longitudinal plan of care for additional care plan information)  Current Barriers:  Marland Kitchen Knowledge Deficits related to procuring housing-Patient has been approved for section 8, however she is having difficulty finding a one bedroom in her budget. Patient continues to look for housing and has filed for an extension. 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   Nurse Case Manager Clinical Goal(s):  Marland Kitchen Over the next 30 days, patient will work with CM clinical social worker to find affordable housing  Interventions:  . Inter-disciplinary care team collaboration (see longitudinal plan of care) . Social Work referral for affordable housing options . BSW located three affordable housing options for the patient. [place], [employer] [phone], and [place] [phone].  Plan:   . Patient will continue to look for housing . RNCM will mail AVS with BSW notes for housing options . BSW will collaborate with [facility] for patient to get resources. BSW made four attempts to reach patient.   Please see past updates related to this goal by clicking on the "Past Updates" button in the selected goal      .  "I need to get my prescription for prednisone and to make an appointment with my heart doctor" (pt-stated)        [place] (see longitudinal plan of care for additional care plan information)  Current Barriers:  . Care Coordination needs related to procuring prescription medication and appointment with patients heart doctor. Patient stated that she could not get her medication until she got some money. Patient has not seen Dr. Haroldine Laws since January 2021 and she needs to make an appointment.-Met, appointment with Dr. Haroldine Laws on 10/09/19, reports filling and taking prednisone prescription.  Nurse Case Manager Clinical Goal(s):  Marland Kitchen Over the next 30 days, patient will meet with RN Care  Manager to address follow up with Cardiologist-Met . Over the next 30 days, patient will demonstrate improved adherence to prescribed treatment plan for COPD as evidenced by obtaining prescribed medication and taking as prescribed -Met . Over the next 30 days, patient will demonstrate improved health management independence as evidenced by attending all follow up appointments-Met . Over the next 30 days, patient will experience decrease in ED visits. ED visits in last 6 months = 3-No ED visits in the last 30 days.  Interventions:  . Inter-disciplinary care team collaboration (see longitudinal plan of care) . Evaluation of current treatment plan related to medication and patient's adherence to plan as established by provider. . Advised patient to call Dr. Clayborne Dana office and schedule an appointment-phone number provided to patient-[phone]. Noted in appointment  desk that patient has called and scheduled appt on 10/09/19 . Reviewed medications with patient and discussed need to pickup prednisone-RNCM called [facility], prednisone is $3 . Collaborated with pharmacy regarding cost of medication . Discussed plans with patient for ongoing care management follow up and provided patient with direct contact information for care management team  Plan:  . Patient will pick up medication today and call for Cardiology appointment today . RNCM will follow up with patient within 30 days   Please see past updates related to this goal by clicking on the "Past Updates" button in the selected goal      .  "I need to get off these cigarettes" (pt-stated)        [place] (see longitudinal plan of care for additional care plan information)  Current Barriers:  Marland Kitchen Knowledge Deficits related to smoking Cessation. Patient would like to quit smoking, she is unable to use nicotine patches due to her having bad dreams while using patches in the past.  Patient is interested in exploring other options.Patient reports cutting down on the number of cigarettes daily to 3-4 per day. She reports feeling better and notices a decreased need for inhalers.  Nurse Case Manager Clinical Goal(s):  Marland Kitchen Over the next 30 days, patient will work with Primary Care Provider to address smoking cessation  Interventions:  . Inter-disciplinary care team collaboration (see longitudinal plan of care) . Provided patient with [facility] educational materials related to smoking cessation . Notify provider of patients desire to explore options related to smoking cessation . Provided information for smoking cessation offered by [employer].  Plan:  . Patient will continue to cut down on the number of cigarettes per day . RNCM will follow up with a telephone visit within 30 days to check on the progress of smoking cessation.   Please see past updates related to this goal  by clicking on the "Past Updates" button in the selected goal      .  "I want to improve my health." (pt-stated)        [place] (see longitudinal plan of care for additional care plan information)  Current Barriers:  . Chronic Disease Management support and education needs related to COPD and CHF  Nurse Case Manager Clinical Goal(s):  Marland Kitchen Over the next 30 days, patient will meet with RN Care Manager to address smoking cessation, medications and improving her health . Over the next 120 days, patient will attend all scheduled medical appointments: PCP on 01/07/20 and Cardiologist on 02/04/20 . Over the next 120 days, patient will experience decrease in ED visits. ED visits in last 6 months = 3  Interventions:  . Inter-disciplinary care team collaboration (see longitudinal plan of care) . Advised patient to call [employer] and inquire about smoking cessation program . Reviewed medications with patient and discussed taking prescribed medications as instructed . Discussed plans with patient for ongoing care management follow up and provided patient with direct contact information for care management team . Provided patient with printed educational materials related to COPD and CHF . Reviewed scheduled/upcoming provider appointments including:01/07/20 with [name] and 02/04/20 with Dr. Haroldine Laws  Plan:  . Patient will attend all scheduled appointments, take medications as prescribed and work on decreasing number of cigarettes smoked daily . RNCM will follow up with a telephone visit on 12/10/19 @ 10:30am   Initial goal documentation        Plan:  A printed copy of the AVS will be mailed, as requested by patient. RNCM will follow up with a telephone visit on 12/10/19 @ 10:30am.   Lurena Joiner RN, [place] RN Care Coordinator

## 2019-12-10 ENCOUNTER — Other Ambulatory Visit: Payer: Self-pay | Admitting: *Deleted

## 2019-12-10 NOTE — Patient Instructions (Signed)
Visit Information  Ms. Latash Nouri  - as a part of your Medicaid benefit, you are eligible for care management and care coordination services at no cost or copay. I was unable to reach you by phone today but would be happy to help you with your health related needs. Please feel free to call me @ [phone].   A member of the Managed Medicaid care management team will reach out to you again over the next 7-14 days.   Lurena Joiner RN, BSN [place]  [employer] manager

## 2019-12-10 NOTE — Patient Outreach (Signed)
Care Coordination  12/10/2019  Stewart 07/27/1970 [id number]   An unsuccessful telephone outreach was attempted today. The patient was referred to the case management team for assistance with care management and care coordination.   Follow Up Plan: A HIPAA compliant phone message was left for the patient providing contact information and requesting a return call.  The Managed Medicaid care management team will reach out to the patient again over the next 7-14 days.    Lurena Joiner RN, BSN [place]  [employer] manager

## 2019-12-20 ENCOUNTER — Other Ambulatory Visit: Payer: Self-pay | Admitting: *Deleted

## 2019-12-20 NOTE — Patient Instructions (Signed)
Visit Information  Ms. Lynn Recendiz  - as a part of your Medicaid benefit, you are eligible for care management and care coordination services at no cost or copay. I was unable to reach you by phone today but would be happy to help you with your health related needs. Please feel free to call me @ [phone].   A member of the Managed Medicaid care management team will reach out to you again over the next 7-14 days.   Lurena Joiner RN, BSN [place]  [employer] manager

## 2019-12-20 NOTE — Patient Outreach (Signed)
[facility]) Care Management  12/20/2019  [name] 10-22-1970 [id number]    Medicaid Managed Care   Unsuccessful Outreach Note  12/20/2019 Name: Sabrina Holland MRN: [id number] DOB: 20-Aug-1970  Referred by: Gildardo Pounds, NP Reason for referral : High Risk Managed Medicaid (Unsuccessful follow up outreach)   A second unsuccessful telephone outreach was attempted today. The patient was referred to the case management team for assistance with care management and care coordination.   Follow Up Plan: A HIPAA compliant phone message was left for the patient providing contact information and requesting a return call.  The Managed Medicaid care management team will reach out to the patient again over the next 7-14 days.   Lurena Joiner RN, BSN [place]  [employer] manager

## 2020-01-07 ENCOUNTER — Ambulatory Visit: Payer: Medicaid Other | Admitting: Nurse Practitioner

## 2020-02-04 ENCOUNTER — Encounter (HOSPITAL_COMMUNITY): Payer: Medicaid Other | Admitting: Internal Medicine

## 2020-02-25 ENCOUNTER — Ambulatory Visit: Payer: Medicaid Other | Admitting: Nurse Practitioner

## 2020-03-15 NOTE — Progress Notes (Signed)
Did not show for visit. Note left for templating purposes only.      ADVANCED HF CLINIC NOTE  PCP: [name] NP Cardiologist: Dr Sallyanne Kuster  Primary HF Cardiologist: Dr Haroldine Laws   HPI: Sabrina Holland is a 50 y.o.with ongoing polysubstance abuse (h/o cocaine, opiates, THC, tobacco abuse, prior habitual ETOH), poor social situation, chronic systolic CHF EF 19-14% (no prior ischemic w/u), HTN, mitral regurgitation, HLD, breast CA (s/p L mastectomy, chemo, tamoxifen), TIA, COPD.  She had a normal echocardiogram in 2013 as part of a work-up for TIA. In 6/19  admitted with SOB and initially had left AMA. However, she returned and was found to have severe CHF with EF 25-30%, grade 3 DD. She was diuresed and started on guideline directed therapy. She has been following with Dr. Recardo Evangelist but therapy limited by her social situation and recurrent cocaine use. Echo 11/2017 showed EF 25-30%, mild MR, mild LAE, PASP 78mmHg. She was admitted in 02/2018 for recurrent HF. She was not taking Lasix as prescribed due to frequent urination at work. She refused coronary CT or cath at that time   Presented to [facility] 6/20 with increased SOB and chest tightness. +  For cocaine on admit. VF arrest on 07/06/18 with defibrillation x1 and intubation.ECHO showed severely reduced EF at 15%.  Treated for PNA. Had [place] with normal cors.   Echo 10/20 EF 20-25%  Admitted 9/21 for COPD flare in setting of recurrent cocaine use after 11 months clean.  Here for f/u   ROS: All systems negative except as listed in HPI, PMH and Problem List.  SH:  Social History   Socioeconomic History   Marital status: Single    Spouse name: Not on file   Number of children: 4   Years of education: Not on file   Highest education level: Not on file  Occupational History   Occupation: trying to get disability    Employer: [employer]  Tobacco Use   Smoking status: Current Every Day Smoker    Packs/day: 5.00    Years:  30.00    Pack years: 150.00    Types: Cigarettes   Smokeless tobacco: Never Used   Tobacco comment: 1 or 2 cigarettes a day- approx 1 pack week  Vaping Use   Vaping Use: Never used  Substance and Sexual Activity   Alcohol use: Yes    Alcohol/week: 12.0 standard drinks    Types: 12 Cans of beer per week    Comment: last drink was 5/19   Drug use: Yes    Types: Marijuana, Cocaine    Comment: daily marijuana; last use of cocaine 05/02/18 but selling it   Sexual activity: Not Currently    Birth control/protection: Surgical  Other Topics Concern   Not on file  Social History Narrative   ** Merged History Encounter **       Social Determinants of Health   Financial Resource Strain: Not on file  Food Insecurity: No Food Insecurity   Worried About Charity fundraiser in the Last Year: Never true   Arboriculturist in the Last Year: Never true  Transportation Needs: Unmet Transportation Needs   Lack of Transportation (Medical): Yes   Lack of Transportation (Non-Medical): Yes  Physical Activity: Insufficiently Active   Days of Exercise per Week: 5 days   Minutes of Exercise per Session: 10 min  Stress: Not on file  Social Connections: Not on file  Intimate Partner Violence: Not on  file    FH:  Family History  Problem Relation Age of Onset   COPD Father    Sickle cell anemia Cousin        maternal cousin    Past Medical History:  Diagnosis Date   Atrial flutter ([place]) 07/03/2018   Breast cancer, stage 2 ([place]) 02/23/2011   Chronic combined systolic and diastolic CHF (congestive heart failure) (HCC)    CKD (chronic kidney disease), stage II    Cocaine use    COPD (chronic obstructive pulmonary disease) ([place])    Homelessness    Hypertension    Polysubstance abuse ([place])    Poor social situation    TIA (transient ischemic attack) 11/25/2011   "first time" (11/25/2011)    Current Outpatient Medications  Medication Sig Dispense Refill   albuterol (PROVENTIL) (2.5 MG/3ML)  0.083% nebulizer solution Take 3 mLs (2.5 mg total) by nebulization every 6 (six) hours as needed for wheezing or shortness of breath. 150 mL 1   albuterol (VENTOLIN HFA) 108 (90 Base) MCG/ACT inhaler Inhale 1-2 puffs into the lungs every 6 (six) hours as needed for wheezing or shortness of breath (cough). Will pick up scripts tomorrow 7-9. 18 g 0   amiodarone (PACERONE) 200 MG tablet TAKE ONE TABLET BY MOUTH ONCE DAILY 30 tablet 2   carvedilol (COREG) 6.25 MG tablet Take 1 tablet (6.25 mg total) by mouth 2 (two) times daily. 180 tablet 3   ELIQUIS 5 MG TABS tablet TAKE 1 TABLET BY MOUTH TWICE DAILY (Patient taking differently: Take 5 mg by mouth 2 (two) times daily. ) 180 tablet 3   ferrous sulfate 325 (65 FE) MG tablet Take 1 tablet (325 mg total) by mouth 2 (two) times daily with a meal. 180 tablet 3   fluticasone (FLONASE) 50 MCG/ACT nasal spray Place 2 sprays into both nostrils daily. (Patient not taking: Reported on 5/95/6387) 16 g 0   folic acid (FOLVITE) 1 MG tablet Take 1 tablet (1 mg total) by mouth daily. 30 tablet 2   sacubitril-valsartan (ENTRESTO) 97-103 MG Take 1 tablet by mouth 2 (two) times daily. 60 tablet 11   sertraline (ZOLOFT) 100 MG tablet Take 1 tablet (100 mg total) by mouth daily. 90 tablet 1   spironolactone (ALDACTONE) 25 MG tablet Take 1 tablet (25 mg total) by mouth daily. F/U with Cardiology for additional refills 30 tablet 0   thiamine 100 MG tablet Take 1 tablet (100 mg total) by mouth daily. 30 tablet 3   No current facility-administered medications for this encounter.    There were no vitals filed for this visit. Wt Readings from Last 3 Encounters:  10/09/19 53.7 kg (118 lb 6.4 oz)  09/24/19 54.4 kg (120 lb)  07/29/19 54.9 kg (121 lb)    PHYSICAL EXAM:  General:  Thin female. No resp difficulty HEENT: normal Neck: supple. no JVD. Carotids 2+ bilat; no bruits. No lymphadenopathy or thryomegaly appreciated. Cor: PMI nondisplaced. Regular rate & rhythm. No  rubs, gallops or murmurs. Lungs: clear with mildly decreased BS throughout Abdomen: soft, nontender, nondistended. No hepatosplenomegaly. No bruits or masses. Good bowel sounds. Extremities: no cyanosis, clubbing, rash, edema Neuro: alert & orientedx3, cranial nerves grossly intact. moves all 4 extremities w/o difficulty. Affect pleasant  ASSESSMENT & PLAN:  1. Chronic systolic HF  -Echo 5/64/33 w/ low EF 15% RV moderately to severely down. Suspect due to polysubstance abuse +/- tachy-induced. Cath 07/10/18 w/ normal cors. Repeat echo 10/20 showed EF 20-25%. RV systolic function mildly  reduced.  - NYHA II - Continue carvedilol6.25 bid - Continue Entresto to 97-103 mg twice a day.  - Continue spiro 25 mg daily.  - Unable to afford [place] - If remains EF still < 35% and can abstain from substance abuse can consider ICD   2. Cardiac arrest VFF/TdP  - s/p Code Blue 07/06/18  with defib x 1 - cath w/ normal cors, EF 20-25% - denies palpitations, syncope/ near syncope  - See ICD discussion above. Repeat echo    3. PAF - Regular on exam. HR controlled in the 60s.  - Continue amio 200 mg daily for now. Will stop if EF improved - Continue eliquis 5 mg twice a day. Denies abnormal bleeding.     4. Polysubstance abuse/cocaine abuse - she was clean x 11 months. Now with recent relapse. Long discussion about need to abstain due to high-risk of recurrent cardaic arrest.   5. Tobacco Abuse.  -Continues to smoke 2-3 cigs/day.  -Encouraged cessation   Glori Bickers MD 11:08 PM

## 2020-03-18 ENCOUNTER — Inpatient Hospital Stay (HOSPITAL_COMMUNITY)
Admission: RE | Admit: 2020-03-18 | Discharge: 2020-03-18 | Disposition: A | Discharge status: Home / Self Care | Payer: Medicaid Other | Source: Ambulatory Visit | Attending: Internal Medicine | Admitting: Internal Medicine

## 2020-03-18 DIAGNOSIS — I5022 Chronic systolic (congestive) heart failure: Secondary | ICD-10-CM

## 2020-04-29 ENCOUNTER — Encounter (HOSPITAL_COMMUNITY): Payer: Self-pay | Admitting: Internal Medicine

## 2020-04-29 ENCOUNTER — Other Ambulatory Visit: Payer: Self-pay

## 2020-04-29 ENCOUNTER — Ambulatory Visit (HOSPITAL_COMMUNITY)
Admission: RE | Admit: 2020-04-29 | Discharge: 2020-04-29 | Disposition: A | Discharge status: Home / Self Care | Payer: Medicaid Other | Source: Ambulatory Visit | Attending: Internal Medicine | Admitting: Internal Medicine

## 2020-04-29 VITALS — BP 140/80 | HR 93 | Wt 117.8 lb

## 2020-04-29 DIAGNOSIS — Z8674 Personal history of sudden cardiac arrest: Secondary | ICD-10-CM | POA: Diagnosis not present

## 2020-04-29 DIAGNOSIS — N182 Chronic kidney disease, stage 2 (mild): Secondary | ICD-10-CM | POA: Insufficient documentation

## 2020-04-29 DIAGNOSIS — F4310 Post-traumatic stress disorder, unspecified: Secondary | ICD-10-CM | POA: Diagnosis not present

## 2020-04-29 DIAGNOSIS — F1410 Cocaine abuse, uncomplicated: Secondary | ICD-10-CM | POA: Insufficient documentation

## 2020-04-29 DIAGNOSIS — I5022 Chronic systolic (congestive) heart failure: Secondary | ICD-10-CM | POA: Diagnosis present

## 2020-04-29 DIAGNOSIS — Z7901 Long term (current) use of anticoagulants: Secondary | ICD-10-CM | POA: Diagnosis not present

## 2020-04-29 DIAGNOSIS — Z9221 Personal history of antineoplastic chemotherapy: Secondary | ICD-10-CM | POA: Diagnosis not present

## 2020-04-29 DIAGNOSIS — I340 Nonrheumatic mitral (valve) insufficiency: Secondary | ICD-10-CM | POA: Insufficient documentation

## 2020-04-29 DIAGNOSIS — I4892 Unspecified atrial flutter: Secondary | ICD-10-CM

## 2020-04-29 DIAGNOSIS — I480 Paroxysmal atrial fibrillation: Secondary | ICD-10-CM | POA: Insufficient documentation

## 2020-04-29 DIAGNOSIS — E785 Hyperlipidemia, unspecified: Secondary | ICD-10-CM | POA: Diagnosis not present

## 2020-04-29 DIAGNOSIS — I5042 Chronic combined systolic (congestive) and diastolic (congestive) heart failure: Secondary | ICD-10-CM | POA: Diagnosis not present

## 2020-04-29 DIAGNOSIS — Z853 Personal history of malignant neoplasm of breast: Secondary | ICD-10-CM | POA: Insufficient documentation

## 2020-04-29 DIAGNOSIS — Z79899 Other long term (current) drug therapy: Secondary | ICD-10-CM | POA: Insufficient documentation

## 2020-04-29 DIAGNOSIS — J449 Chronic obstructive pulmonary disease, unspecified: Secondary | ICD-10-CM | POA: Insufficient documentation

## 2020-04-29 DIAGNOSIS — F17210 Nicotine dependence, cigarettes, uncomplicated: Secondary | ICD-10-CM | POA: Diagnosis not present

## 2020-04-29 DIAGNOSIS — I130 Hypertensive heart and chronic kidney disease with heart failure and stage 1 through stage 4 chronic kidney disease, or unspecified chronic kidney disease: Secondary | ICD-10-CM | POA: Insufficient documentation

## 2020-04-29 MED ORDER — CARVEDILOL 6.25 MG PO TABS
9.3750 mg | ORAL_TABLET | Freq: Two times a day (BID) | ORAL | 6 refills | Status: DC
Start: 2020-04-29 — End: 2022-07-14

## 2020-04-29 MED ORDER — AMIODARONE HCL 200 MG PO TABS
100.0000 mg | ORAL_TABLET | Freq: Every day | ORAL | 6 refills | Status: DC
Start: 2020-04-29 — End: 2022-07-14

## 2020-04-29 NOTE — Patient Instructions (Signed)
Decrease Amiodarone to 100 mg Daily  Increase Carvedilol to 9.375 mg (1 & 1/2 tabs) Twice daily   Please come back tomorrow for lab work and an EKG  If you have any questions or concerns before your next appointment please send [place] a message through [place] or call our office at [phone].    TO LEAVE A MESSAGE FOR THE NURSE SELECT OPTION 2, PLEASE LEAVE A MESSAGE INCLUDING: . YOUR NAME . DATE OF BIRTH . CALL BACK NUMBER . REASON FOR CALL**this is important as we prioritize the call backs  YOU WILL RECEIVE A CALL BACK THE SAME DAY AS LONG AS YOU CALL BEFORE 4:00 PM

## 2020-04-29 NOTE — Progress Notes (Signed)
Did not show for visit. Note left for templating purposes only.      ADVANCED HF CLINIC NOTE  PCP: [name] NP Cardiologist: Dr Sallyanne Kuster  Primary HF Cardiologist: Dr Haroldine Laws   HPI: Christean Silvestri is a 50 y.o.with ongoing polysubstance abuse (h/o cocaine, opiates, THC, tobacco abuse, prior habitual ETOH), poor social situation, chronic systolic CHF EF 47-82% (no prior ischemic w/u), HTN, mitral regurgitation, HLD, breast CA (s/p L mastectomy, chemo, tamoxifen), TIA, COPD.  She had a normal echocardiogram in 2013 as part of a work-up for TIA. In 6/19  admitted with SOB and initially had left AMA. However, she returned and was found to have severe CHF with EF 25-30%, grade 3 DD. She was diuresed and started on guideline directed therapy. She has been following with Dr. Recardo Evangelist but therapy limited by her social situation and recurrent cocaine use. Echo 11/2017 showed EF 25-30%, mild MR, mild LAE, PASP 35mmHg. She was admitted in 02/2018 for recurrent HF. She was not taking Lasix as prescribed due to frequent urination at work. She refused coronary CT or cath at that time   Presented to [facility] 6/20 with increased SOB and chest tightness. +  For cocaine on admit. VF arrest on 07/06/18 with defibrillation x1 and intubation.ECHO showed severely reduced EF at 15%.  Treated for PNA. Had [place] with normal cors.   Echo 10/20 EF 20-25%  Admitted 9/21 for COPD flare in setting of recurrent cocaine use after 11 months clean.  Here for f/u. Working 2 jobs. 5 days a week feels great. Other 2 days struggles with depression and anxiety. 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 Mild SOB on her days. No CP, edema, orthopnea or PND. Smoking about 5 cigs/day. Using cocaine 2-3x/month. Occasional red wine. Takes heart medicines regularly.   Bedside echo done personally EF 45-50%   ROS: All systems negative except as listed in HPI, PMH and Problem List.  SH:  Social History   Socioeconomic History  .  Marital status: Single    Spouse name: Not on file  . Number of children: 4  . Years of education: Not on file  . Highest education level: Not on file  Occupational History  . Occupation: trying to get disability    Employer: [employer]  Tobacco Use  . Smoking status: Current Every Day Smoker    Packs/day: 5.00    Years: 30.00    Pack years: 150.00    Types: Cigarettes  . Smokeless tobacco: Never Used  . Tobacco comment: 1 or 2 cigarettes a day- approx 1 pack week  Vaping Use  . Vaping Use: Never used  Substance and Sexual Activity  . Alcohol use: Yes    Alcohol/week: 12.0 standard drinks    Types: 12 Cans of beer per week    Comment: last drink was 5/19  . Drug use: Yes    Types: Marijuana, Cocaine    Comment: daily marijuana; last use of cocaine 05/02/18 but selling it  . Sexual activity: Not Currently    Birth control/protection: Surgical  Other Topics Concern  . Not on file  Social History Narrative   ** Merged History Encounter **       Social Determinants of Health   Financial Resource Strain: Not on file  Food Insecurity: No Food Insecurity  . Worried About Charity fundraiser in the Last Year: Never true  . Ran Out of Food in the Last Year: Never true  Transportation Needs: Unmet Transportation  Needs  . Lack of Transportation (Medical): Yes  . Lack of Transportation (Non-Medical): Yes  Physical Activity: Insufficiently Active  . Days of Exercise per Week: 5 days  . Minutes of Exercise per Session: 10 min  Stress: Not on file  Social Connections: Not on file  Intimate Partner Violence: Not on file    FH:  Family History  Problem Relation Age of Onset  . COPD Father   . Sickle cell anemia Cousin        maternal cousin    Past Medical History:  Diagnosis Date  . Atrial flutter ([place]) 07/03/2018  . Breast cancer, stage 2 ([place]) 02/23/2011  . Chronic combined systolic and diastolic CHF (congestive heart failure) ([place])   . CKD (chronic kidney disease),  stage II   . Cocaine use   . COPD (chronic obstructive pulmonary disease) ([place])   . Homelessness   . Hypertension   . Polysubstance abuse ([place])   . Poor social situation   . TIA (transient ischemic attack) 11/25/2011   "first time" (11/25/2011)    Current Outpatient Medications  Medication Sig Dispense Refill  . albuterol (VENTOLIN HFA) 108 (90 Base) MCG/ACT inhaler Inhale 1-2 puffs into the lungs every 6 (six) hours as needed for wheezing or shortness of breath (cough). Will pick up scripts tomorrow 7-9. 18 g 0  . amiodarone (PACERONE) 200 MG tablet TAKE ONE TABLET BY MOUTH ONCE DAILY 30 tablet 2  . carvedilol (COREG) 6.25 MG tablet Take 1 tablet (6.25 mg total) by mouth 2 (two) times daily. 180 tablet 3  . ELIQUIS 5 MG TABS tablet TAKE 1 TABLET BY MOUTH TWICE DAILY 180 tablet 3  . ferrous sulfate 325 (65 FE) MG tablet Take 1 tablet (325 mg total) by mouth 2 (two) times daily with a meal. 180 tablet 3  . sacubitril-valsartan (ENTRESTO) 97-103 MG Take 1 tablet by mouth 2 (two) times daily. 60 tablet 11  . sertraline (ZOLOFT) 100 MG tablet Take 1 tablet (100 mg total) by mouth daily. 90 tablet 1  . VITAMIN D, CHOLECALCIFEROL, PO Take 1 tablet by mouth once a week.    . spironolactone (ALDACTONE) 25 MG tablet Take 1 tablet (25 mg total) by mouth daily. F/U with Cardiology for additional refills 30 tablet 0   No current facility-administered medications for this encounter.    Vitals:   04/29/20 1122  BP: 140/80  Pulse: 93  SpO2: 95%  Weight: 53.4 kg (117 lb 12.8 oz)   Wt Readings from Last 3 Encounters:  04/29/20 53.4 kg (117 lb 12.8 oz)  10/09/19 53.7 kg (118 lb 6.4 oz)  09/24/19 54.4 kg (120 lb)    PHYSICAL EXAM:  General:  Thin female. No resp difficulty HEENT: normal Neck: supple. no JVD. Carotids 2+ bilat; no bruits. No lymphadenopathy or thryomegaly appreciated. Cor: PMI nondisplaced. Regular rate & rhythm. No rubs, gallops or murmurs. Lungs: clear Abdomen: soft,  nontender, nondistended. No hepatosplenomegaly. No bruits or masses. Good bowel sounds. Extremities: no cyanosis, clubbing, rash, edema Neuro: alert & orientedx3, cranial nerves grossly intact. moves all 4 extremities w/o difficulty. Affect pleasant   ASSESSMENT & PLAN:  1. Chronic systolic HF  -Echo 3/50/09 w/ low EF 15% RV moderately to severely down. Suspect due to polysubstance abuse +/- tachy-induced. Cath 07/10/18 w/ normal cors. Repeat echo 10/20 showed EF 20-25%. RV systolic function mildly reduced. \ - bedside echo today EF 45-50%  - Stable NYHA I-II symptoms - Increase carvedilol to 9.375 bid - Continue  Entresto to 97-103 mg twice a day.  - Continue spiro 25 mg daily.  - Unable to afford [place] labs  2. Cardiac arrest VFF/TdP  - s/p Code Blue 07/06/18  with defib x 1 - cath w/ normal cors, EF 20-25% - denies palpitations, syncope/ near syncope  - EF now improved. Does not qualify for ICD    3. PAF - Remains in NSR. Continue amio 100 for now - Check amio labs - Continue eliquis 5 mg twice a day. Denies abnormal bleeding.    4. Polysubstance abuse/cocaine abuse - still using a bit. We discussed benefits of quitting   5. Tobacco Abuse.  -Continues to smoke 2-3 cigs/day.  -Encouraged cessation   Glori Bickers MD 11:51 AM

## 2020-04-30 ENCOUNTER — Encounter (HOSPITAL_COMMUNITY): Payer: Medicaid Other

## 2020-07-20 ENCOUNTER — Emergency Department (HOSPITAL_COMMUNITY): Payer: Medicaid Other

## 2020-07-20 ENCOUNTER — Other Ambulatory Visit: Payer: Self-pay

## 2020-07-20 ENCOUNTER — Emergency Department (HOSPITAL_COMMUNITY)
Admission: EM | Admit: 2020-07-20 | Discharge: 2020-07-20 | Discharge status: Against Medical Advice | Payer: Medicaid Other | Attending: Emergency Medicine | Admitting: Emergency Medicine

## 2020-07-20 ENCOUNTER — Encounter (HOSPITAL_COMMUNITY): Payer: Self-pay | Admitting: *Deleted

## 2020-07-20 DIAGNOSIS — R0602 Shortness of breath: Secondary | ICD-10-CM | POA: Diagnosis not present

## 2020-07-20 DIAGNOSIS — I130 Hypertensive heart and chronic kidney disease with heart failure and stage 1 through stage 4 chronic kidney disease, or unspecified chronic kidney disease: Secondary | ICD-10-CM | POA: Insufficient documentation

## 2020-07-20 DIAGNOSIS — J441 Chronic obstructive pulmonary disease with (acute) exacerbation: Secondary | ICD-10-CM | POA: Diagnosis not present

## 2020-07-20 DIAGNOSIS — N182 Chronic kidney disease, stage 2 (mild): Secondary | ICD-10-CM | POA: Diagnosis not present

## 2020-07-20 DIAGNOSIS — R0902 Hypoxemia: Secondary | ICD-10-CM

## 2020-07-20 DIAGNOSIS — I4892 Unspecified atrial flutter: Secondary | ICD-10-CM | POA: Insufficient documentation

## 2020-07-20 DIAGNOSIS — Z7901 Long term (current) use of anticoagulants: Secondary | ICD-10-CM | POA: Insufficient documentation

## 2020-07-20 DIAGNOSIS — Z79899 Other long term (current) drug therapy: Secondary | ICD-10-CM | POA: Insufficient documentation

## 2020-07-20 DIAGNOSIS — Z5321 Procedure and treatment not carried out due to patient leaving prior to being seen by health care provider: Secondary | ICD-10-CM | POA: Diagnosis not present

## 2020-07-20 DIAGNOSIS — I5043 Acute on chronic combined systolic (congestive) and diastolic (congestive) heart failure: Secondary | ICD-10-CM | POA: Diagnosis not present

## 2020-07-20 DIAGNOSIS — J439 Emphysema, unspecified: Secondary | ICD-10-CM | POA: Diagnosis not present

## 2020-07-20 DIAGNOSIS — F17210 Nicotine dependence, cigarettes, uncomplicated: Secondary | ICD-10-CM | POA: Insufficient documentation

## 2020-07-20 DIAGNOSIS — Z853 Personal history of malignant neoplasm of breast: Secondary | ICD-10-CM | POA: Diagnosis not present

## 2020-07-20 DIAGNOSIS — Z20822 Contact with and (suspected) exposure to covid-19: Secondary | ICD-10-CM | POA: Insufficient documentation

## 2020-07-20 DIAGNOSIS — I10 Essential (primary) hypertension: Secondary | ICD-10-CM | POA: Diagnosis not present

## 2020-07-20 DIAGNOSIS — R410 Disorientation, unspecified: Secondary | ICD-10-CM | POA: Diagnosis not present

## 2020-07-20 DIAGNOSIS — R9431 Abnormal electrocardiogram [ECG] [EKG]: Secondary | ICD-10-CM | POA: Diagnosis not present

## 2020-07-20 LAB — I-STAT VENOUS BLOOD GAS, ED
Acid-Base Excess: 3 mmol/L — ABNORMAL HIGH (ref 0.0–2.0)
Bicarbonate: 29.7 mmol/L — ABNORMAL HIGH (ref 20.0–28.0)
Calcium, Ion: 1.19 mmol/L (ref 1.15–1.40)
HCT: 42 % (ref 36.0–46.0)
Hemoglobin: 14.3 g/dL (ref 12.0–15.0)
O2 Saturation: 94 %
Potassium: 4.1 mmol/L (ref 3.5–5.1)
Sodium: 141 mmol/L (ref 135–145)
TCO2: 31 mmol/L (ref 22–32)
pCO2, Ven: 51.8 mmHg (ref 44.0–60.0)
pH, Ven: 7.367 (ref 7.250–7.430)
pO2, Ven: 74 mmHg — ABNORMAL HIGH (ref 32.0–45.0)

## 2020-07-20 LAB — CBC WITH DIFFERENTIAL/PLATELET
Abs Immature Granulocytes: 0.02 10*3/uL (ref 0.00–0.07)
Basophils Absolute: 0.1 10*3/uL (ref 0.0–0.1)
Basophils Relative: 1 %
Eosinophils Absolute: 0.2 10*3/uL (ref 0.0–0.5)
Eosinophils Relative: 4 %
HCT: 41.9 % (ref 36.0–46.0)
Hemoglobin: 13.3 g/dL (ref 12.0–15.0)
Immature Granulocytes: 0 %
Lymphocytes Relative: 31 %
Lymphs Abs: 1.8 10*3/uL (ref 0.7–4.0)
MCH: 28.5 pg (ref 26.0–34.0)
MCHC: 31.7 g/dL (ref 30.0–36.0)
MCV: 89.9 fL (ref 80.0–100.0)
Monocytes Absolute: 0.6 10*3/uL (ref 0.1–1.0)
Monocytes Relative: 10 %
Neutro Abs: 3.1 10*3/uL (ref 1.7–7.7)
Neutrophils Relative %: 54 %
Platelets: 307 10*3/uL (ref 150–400)
RBC: 4.66 MIL/uL (ref 3.87–5.11)
RDW: 13.8 % (ref 11.5–15.5)
WBC: 5.8 10*3/uL (ref 4.0–10.5)
nRBC: 0 % (ref 0.0–0.2)

## 2020-07-20 LAB — BASIC METABOLIC PANEL
Anion gap: 8 (ref 5–15)
BUN: 16 mg/dL (ref 6–20)
CO2: 28 mmol/L (ref 22–32)
Calcium: 9.6 mg/dL (ref 8.9–10.3)
Chloride: 105 mmol/L (ref 98–111)
Creatinine, Ser: 0.77 mg/dL (ref 0.44–1.00)
GFR, Estimated: >60 mL/min (ref >60)
Glucose, Bld: 111 mg/dL — ABNORMAL HIGH (ref 70–99)
Potassium: 4.3 mmol/L (ref 3.5–5.1)
Sodium: 141 mmol/L (ref 135–145)

## 2020-07-20 LAB — BRAIN NATRIURETIC PEPTIDE: B Natriuretic Peptide: 21.9 pg/mL (ref 0.0–100.0)

## 2020-07-20 LAB — RESP PANEL BY RT-PCR (FLU A&B, COVID) ARPGX2
Influenza A by PCR: NEGATIVE
Influenza B by PCR: NEGATIVE
SARS Coronavirus 2 by RT PCR: NEGATIVE

## 2020-07-20 LAB — TROPONIN I (HIGH SENSITIVITY)
Troponin I (High Sensitivity): 6 ng/L (ref <18)
Troponin I (High Sensitivity): 4 ng/L (ref <18)

## 2020-07-20 MED ORDER — ALBUTEROL (5 MG/ML) CONTINUOUS INHALATION SOLN
10.0000 mg/h | INHALATION_SOLUTION | RESPIRATORY_TRACT | Status: DC
  Administered 2020-07-20: 10 mg/h via RESPIRATORY_TRACT
  Filled 2020-07-20: qty 20

## 2020-07-20 MED ORDER — METHYLPREDNISOLONE SODIUM SUCC 125 MG IJ SOLR
125.0000 mg | Freq: Once | INTRAMUSCULAR | Status: AC
  Filled 2020-07-20: qty 2

## 2020-07-20 MED ORDER — MAGNESIUM SULFATE 2 GM/50ML IV SOLN
2.0000 g | Freq: Once | INTRAVENOUS | Status: AC
  Administered 2020-07-20: 2 g via INTRAVENOUS
  Filled 2020-07-20: qty 50

## 2020-07-20 MED ORDER — METHYLPREDNISOLONE SODIUM SUCC 125 MG IJ SOLR
INTRAMUSCULAR | Status: AC
  Administered 2020-07-20: 125 mg via INTRAVENOUS
  Filled 2020-07-20: qty 2

## 2020-07-20 MED ORDER — IPRATROPIUM BROMIDE 0.02 % IN SOLN
0.5000 mg | Freq: Once | RESPIRATORY_TRACT | Status: AC
  Administered 2020-07-20: 0.5 mg via RESPIRATORY_TRACT
  Filled 2020-07-20: qty 2.5

## 2020-07-20 MED ORDER — PREDNISONE 10 MG PO TABS: 40.0000 mg | ORAL_TABLET | Freq: Every day | ORAL | 0 refills | Status: DC

## 2020-07-20 MED ORDER — ALBUTEROL SULFATE HFA 108 (90 BASE) MCG/ACT IN AERS: 1.0000 | INHALATION_SPRAY | Freq: Four times a day (QID) | RESPIRATORY_TRACT | 0 refills | Status: DC | PRN

## 2020-07-20 NOTE — ED Provider Notes (Signed)
[facility] EMERGENCY DEPARTMENT Provider Note   CSN: [id number] Arrival date & time: 07/20/20  0753     History Chief Complaint  Patient presents with   Shortness of Breath    Sabrina Holland is a 50 y.o. female.  The history is provided by the patient, medical records and the EMS personnel.  Sabrina [facility] is a 50 y.o. female who presents to the Emergency Department complaining of shortness of breath. She presents to the emergency department by EMS complaining of shortness of breath that started yesterday. She reports difficulty breathing, poor sleep. She does have some associated chest discomfort. Unknown if she is having fevers. She has a nonproductive cough. No abdominal pain, vomiting, diarrhea, leg swelling or pain. She has had significant stressors at home and did relapse on crack cocaine recently (Thursday and Friday). She has not been using her medications for the last 1 to 2 days    Past Medical History:  Diagnosis Date   Atrial flutter ([place]) 07/03/2018   Breast cancer, stage 2 ([place]) 02/23/2011   Chronic combined systolic and diastolic CHF (congestive heart failure) (H[facility])    CKD (chronic kidney disease), stage II    Cocaine use    COPD (chronic obstructive pulmonary disease) ([place])    Homelessness    Hypertension    Polysubstance abuse ([place])    Poor social situation    TIA (transient ischemic attack) 11/25/2011   "first time" (11/25/2011)    Patient Active Problem List   Diagnosis Date Noted   Palliative care by specialist    Goals of care, counseling/discussion    Acute respiratory failure ([place])    Endotracheally intubated    Torsades de pointes ([place])    Cardiac arrest ([place])    Atrial flutter ([place]) 07/03/2018   Elevated LFTs 04/18/2018   Malnutrition of moderate degree 02/27/2018   Dyspnea 02/26/2018   Acute on chronic systolic and diastolic heart failure, NYHA class 3 ([place]) 01/20/2018   Cocaine abuse ([place]) 12/19/2017   Mixed  hyperlipidemia 12/19/2017   COPD exacerbation ([place])    Acute on chronic respiratory failure with hypoxemia ([place]) 12/03/2017   Cardiomyopathy ([place]) 07/04/2017   COPD (chronic obstructive pulmonary disease) ([place])    Chronic combined systolic and diastolic heart failure ([place])    Anemia 06/20/2017   Polysubstance abuse ([place]) 11/26/2011   TIA (transient ischemic attack) 11/25/2011   Essential hypertension 11/25/2011   Sinusitis 11/25/2011   Vitamin B 12 deficiency 07/18/2011   History of breast cancer 02/23/2011    Past Surgical History:  Procedure Laterality Date   BREAST BIOPSY  2007   left   MASTECTOMY  09/2005   left   RIGHT/LEFT HEART CATH AND CORONARY ANGIOGRAPHY N/A 07/10/2018   Procedure: RIGHT/LEFT HEART CATH AND CORONARY ANGIOGRAPHY;  Surgeon: Jolaine Artist, MD;  Location: [facility];  Service: Cardiovascular;  Laterality: N/A;   TONSILLECTOMY AND ADENOIDECTOMY     "when I was little" (11/25/2011)   TUBAL LIGATION  1993     OB History     Gravida  5   Para  4   Term  4   Preterm  0   AB  1   Living         SAB  1   IAB  0   Ectopic  0   Multiple      Live Births              Family History  Problem Relation Age of Onset   COPD Father    Sickle cell anemia Cousin        maternal cousin    Social History   Tobacco Use   Smoking status: Every Day    Packs/day: 5.00    Years: 30.00    Pack years: 150.00    Types: Cigarettes   Smokeless tobacco: Never   Tobacco comments:    1 or 2 cigarettes a day- approx 1 pack week  Vaping Use   Vaping Use: Never used  Substance Use Topics   Alcohol use: Yes    Alcohol/week: 12.0 standard drinks    Types: 12 Cans of beer per week    Comment: last drink was 5/19   Drug use: Yes    Types: Marijuana, Cocaine    Comment: daily marijuana; last use of cocaine 05/02/18 but selling it    Home Medications Prior to Admission medications   Medication Sig Start Date End Date Taking?  Authorizing Provider  albuterol (VENTOLIN HFA) 108 (90 Base) MCG/ACT inhaler Inhale 1-2 puffs into the lungs every 6 (six) hours as needed for wheezing or shortness of breath. 07/20/20  Yes Quintella Reichert, MD  predniSONE (DELTASONE) 10 MG tablet Take 4 tablets (40 mg total) by mouth daily. 07/20/20  Yes Quintella Reichert, MD  albuterol (VENTOLIN HFA) 108 (90 Base) MCG/ACT inhaler Inhale 1-2 puffs into the lungs every 6 (six) hours as needed for wheezing or shortness of breath (cough). Will pick up scripts tomorrow 7-9. 09/16/19   Volney American, PA-C  amiodarone (PACERONE) 200 MG tablet Take 0.5 tablets (100 mg total) by mouth daily. 04/29/20   Bensimhon, Shaune Pascal, MD  carvedilol (COREG) 6.25 MG tablet Take 1.5 tablets (9.375 mg total) by mouth 2 (two) times daily. 04/29/20   Bensimhon, Shaune Pascal, MD  ELIQUIS 5 MG TABS tablet TAKE 1 TABLET BY MOUTH TWICE DAILY 07/23/19   Larey Dresser, MD  ferrous sulfate 325 (65 FE) MG tablet Take 1 tablet (325 mg total) by mouth 2 (two) times daily with a meal. 07/29/19   Gildardo Pounds, NP  sacubitril-valsartan (ENTRESTO) 97-103 MG Take 1 tablet by mouth 2 (two) times daily. 01/02/19   Bensimhon, Shaune Pascal, MD  sertraline (ZOLOFT) 100 MG tablet Take 1 tablet (100 mg total) by mouth daily. 10/08/19 01/06/20  Gildardo Pounds, NP  spironolactone (ALDACTONE) 25 MG tablet Take 1 tablet (25 mg total) by mouth daily. F/U with Cardiology for additional refills 07/29/19 11/06/19  Gildardo Pounds, NP  VITAMIN D, CHOLECALCIFEROL, PO Take 1 tablet by mouth once a week.    [provider]    Allergies    Penicillins  Review of Systems   Review of Systems  All other systems reviewed and are negative.  Physical Exam Updated Vital Signs BP (!) 170/95 (BP Location: Right Arm)   Pulse 87   Temp 97.8 F (36.6 C)   Resp (!) 23   LMP 02/18/2015 (Exact Date) Comment: MAYBE ONCE A YEAR  SpO2 95%   Physical Exam Vitals and nursing note reviewed.  Constitutional:       General: She is in acute distress.     Appearance: She is well-developed. She is ill-appearing.  HENT:     Head: Normocephalic and atraumatic.  Cardiovascular:     Rate and Rhythm: Normal rate and regular rhythm.     Heart sounds: No murmur heard. Pulmonary:     Effort: Respiratory distress present.  Comments: Decreased air movement in all lung fields.  Accessory muscle use Abdominal:     Palpations: Abdomen is soft.     Tenderness: There is no abdominal tenderness. There is no guarding or rebound.  Musculoskeletal:        General: No swelling or tenderness.  Skin:    General: Skin is warm and dry.  Neurological:     Mental Status: She is alert and oriented to person, place, and time.  Psychiatric:        Behavior: Behavior normal.    ED Results / Procedures / Treatments   Labs (all labs ordered are listed, but only abnormal results are displayed) Labs Reviewed  BASIC METABOLIC PANEL - Abnormal; Notable for the following components:      Result Value   Glucose, Bld 111 (*)    All other components within normal limits  I-STAT VENOUS BLOOD GAS, ED - Abnormal; Notable for the following components:   pO2, Ven 74.0 (*)    Bicarbonate 29.7 (*)    Acid-Base Excess 3.0 (*)    All other components within normal limits  RESP PANEL BY RT-PCR (FLU A&B, COVID) ARPGX2  CBC WITH DIFFERENTIAL/PLATELET  BRAIN NATRIURETIC PEPTIDE  TROPONIN I (HIGH SENSITIVITY)  TROPONIN I (HIGH SENSITIVITY)    EKG EKG Interpretation  Date/Time:  Monday July 20 2020 08:01:47 EDT Ventricular Rate:  79 PR Interval:  160 QRS Duration: 82 QT Interval:  401 QTC Calculation: 460 R Axis:   85 Text Interpretation: Sinus rhythm Biatrial enlargement Probable left ventricular hypertrophy Nonspecific T abnrm, anterolateral leads Confirmed by Quintella Reichert [id number]) on 07/20/2020 8:06:24 AM  Radiology DG Chest Port 1 View  Result Date: 07/20/2020 CLINICAL DATA:  Shortness of breath evaluation. EXAM:  PORTABLE CHEST 1 VIEW COMPARISON:  10/03/2019 FINDINGS: Hyperexpanded lungs are compatible with emphysema. No focal airspace disease or lung consolidation. Heart and mediastinum are stable. Negative for a pneumothorax. IMPRESSION: Emphysema without acute findings. Electronically Signed   By: Markus Daft M.D.   On: 07/20/2020 08:14    Procedures Procedures CRITICAL CARE Performed by: Quintella Reichert   Total critical care time: 40 minutes  Critical care time was exclusive of separately billable procedures and treating other patients.  Critical care was necessary to treat or prevent imminent or life-threatening deterioration.  Critical care was time spent personally by me on the following activities: development of treatment plan with patient and/or surrogate as well as nursing, discussions with consultants, evaluation of patient's response to treatment, examination of patient, obtaining history from patient or surrogate, ordering and performing treatments and interventions, ordering and review of laboratory studies, ordering and review of radiographic studies, pulse oximetry and re-evaluation of patient's condition.   Medications Ordered in ED Medications  albuterol (PROVENTIL,VENTOLIN) solution continuous neb (0 mg/hr Nebulization Stopped 07/20/20 0930)  ipratropium (ATROVENT) nebulizer solution 0.5 mg (0.5 mg Nebulization Given 07/20/20 0810)  methylPREDNISolone sodium succinate (SOLU-MEDROL) 125 mg/2 mL injection 125 mg (125 mg Intravenous Given 07/20/20 0909)  magnesium sulfate IVPB 2 g 50 mL (0 g Intravenous Stopped 07/20/20 1218)    ED Course  I have reviewed the triage vital signs and the nursing notes.  Pertinent labs & imaging results that were available during my care of the patient were reviewed by me and considered in my medical decision making (see chart for details).    MDM Rules/Calculators/A&P  patient with history of CHF and COPD here for evaluation of  shortness of breath since yesterday. Patient ill appearing on ED presentation with increased work of breathing. She was treated without hour-long nebulizer treatment with improvement in her respiratory status but she still appears ill. She does have hypoxia when removed from oxygen with sats a drop down to the 80s at rest. Discussed with patient strong recommendation for admission given her hypoxia. She acknowledges risks if she does not stay in the hospital but refuses to stay due to childcare issues. Patient discharged AMA with recommendation to return for ongoing care when she is ready. Final Clinical Impression(s) / ED Diagnoses Final diagnoses:  COPD exacerbation ([place])  Hypoxia    Rx / DC Orders ED Discharge Orders          Ordered    predniSONE (DELTASONE) 10 MG tablet  Daily        07/20/20 1223    albuterol (VENTOLIN HFA) 108 (90 Base) MCG/ACT inhaler  Every 6 hours PRN        07/20/20 1223             Quintella Reichert, MD 07/20/20 1521

## 2020-07-20 NOTE — ED Notes (Signed)
Pt ambulated with pulse oxymetry on room air. Sat remained 85% with ambulation. 79% at rest when finish. Felt "some shortness of breath".

## 2020-07-20 NOTE — ED Notes (Signed)
Verbalizing doesn't want to stay or be admitted. Wants to leave. Given food and drink per request. IV mag infusing.
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
## 2020-07-20 NOTE — ED Notes (Signed)
Dr. Ralene Bathe into see, at [facility].

## 2020-07-20 NOTE — ED Notes (Addendum)
Admission discussed. Demanding to leave. AMA discussed between pt and Dr. Ralene Bathe. Pt requesting IV out and d/c paperwork.Pt leaving AMA. Given d/c paperwork. Admission necessity explained with rationale. Encouraged to stay, as well as return anytime, if worse, and if new sx.

## 2020-07-21 ENCOUNTER — Telehealth: Payer: Self-pay | Admitting: *Deleted

## 2020-07-21 NOTE — Telephone Encounter (Signed)
Transition Care Management Unsuccessful Follow-up Telephone Call  Date of discharge and from where:  07/20/2020 [facility]  Attempts:  1st Attempt  Reason for unsuccessful TCM follow-up call:  No answer/busy

## 2020-07-22 NOTE — Telephone Encounter (Signed)
Transition Care Management Unsuccessful Follow-up Telephone Call  Date of discharge and from where:  07/20/[facility]  Attempts:  2nd Attempt  Reason for unsuccessful TCM follow-up call:  No answer/busy

## 2020-07-23 NOTE — Telephone Encounter (Signed)
Transition Care Management Unsuccessful Follow-up Telephone Call  Date of discharge and from where:  07/20/2020 [facility]  Attempts:  3rd Attempt  Reason for unsuccessful TCM follow-up call:  No answer/busy

## 2020-07-29 ENCOUNTER — Encounter (HOSPITAL_COMMUNITY): Payer: Medicaid Other

## 2020-07-30 ENCOUNTER — Encounter (HOSPITAL_COMMUNITY): Payer: Medicaid Other

## 2020-08-14 ENCOUNTER — Encounter (HOSPITAL_COMMUNITY): Payer: Medicaid Other

## 2020-08-24 ENCOUNTER — Encounter (HOSPITAL_COMMUNITY): Payer: Medicaid Other

## 2020-08-27 ENCOUNTER — Other Ambulatory Visit: Payer: Self-pay | Admitting: Nurse Practitioner

## 2020-08-27 ENCOUNTER — Emergency Department (HOSPITAL_COMMUNITY): Payer: Medicaid Other

## 2020-08-27 ENCOUNTER — Other Ambulatory Visit: Payer: Self-pay

## 2020-08-27 ENCOUNTER — Observation Stay (HOSPITAL_COMMUNITY)
Admission: EM | Admit: 2020-08-27 | Discharge: 2020-08-27 | Disposition: A | Discharge status: Home / Self Care | Payer: Medicaid Other | Attending: Student in an Organized Health Care Education/Training Program | Admitting: Student in an Organized Health Care Education/Training Program

## 2020-08-27 ENCOUNTER — Encounter (HOSPITAL_COMMUNITY): Payer: Self-pay | Admitting: Emergency Medicine

## 2020-08-27 DIAGNOSIS — N182 Chronic kidney disease, stage 2 (mild): Secondary | ICD-10-CM | POA: Insufficient documentation

## 2020-08-27 DIAGNOSIS — Z79899 Other long term (current) drug therapy: Secondary | ICD-10-CM | POA: Diagnosis not present

## 2020-08-27 DIAGNOSIS — Z7901 Long term (current) use of anticoagulants: Secondary | ICD-10-CM | POA: Diagnosis not present

## 2020-08-27 DIAGNOSIS — J9602 Acute respiratory failure with hypercapnia: Secondary | ICD-10-CM | POA: Diagnosis not present

## 2020-08-27 DIAGNOSIS — F17210 Nicotine dependence, cigarettes, uncomplicated: Secondary | ICD-10-CM | POA: Insufficient documentation

## 2020-08-27 DIAGNOSIS — I130 Hypertensive heart and chronic kidney disease with heart failure and stage 1 through stage 4 chronic kidney disease, or unspecified chronic kidney disease: Secondary | ICD-10-CM | POA: Insufficient documentation

## 2020-08-27 DIAGNOSIS — Z20822 Contact with and (suspected) exposure to covid-19: Secondary | ICD-10-CM | POA: Insufficient documentation

## 2020-08-27 DIAGNOSIS — R0902 Hypoxemia: Secondary | ICD-10-CM | POA: Diagnosis not present

## 2020-08-27 DIAGNOSIS — I5042 Chronic combined systolic (congestive) and diastolic (congestive) heart failure: Secondary | ICD-10-CM | POA: Insufficient documentation

## 2020-08-27 DIAGNOSIS — Z853 Personal history of malignant neoplasm of breast: Secondary | ICD-10-CM | POA: Diagnosis not present

## 2020-08-27 DIAGNOSIS — R0689 Other abnormalities of breathing: Secondary | ICD-10-CM | POA: Diagnosis not present

## 2020-08-27 DIAGNOSIS — J9600 Acute respiratory failure, unspecified whether with hypoxia or hypercapnia: Secondary | ICD-10-CM | POA: Insufficient documentation

## 2020-08-27 DIAGNOSIS — J441 Chronic obstructive pulmonary disease with (acute) exacerbation: Secondary | ICD-10-CM | POA: Diagnosis not present

## 2020-08-27 DIAGNOSIS — R0602 Shortness of breath: Secondary | ICD-10-CM | POA: Diagnosis not present

## 2020-08-27 DIAGNOSIS — R062 Wheezing: Secondary | ICD-10-CM | POA: Diagnosis not present

## 2020-08-27 DIAGNOSIS — J80 Acute respiratory distress syndrome: Secondary | ICD-10-CM | POA: Diagnosis not present

## 2020-08-27 DIAGNOSIS — J439 Emphysema, unspecified: Secondary | ICD-10-CM | POA: Diagnosis not present

## 2020-08-27 DIAGNOSIS — F32A Depression, unspecified: Secondary | ICD-10-CM

## 2020-08-27 LAB — I-STAT VENOUS BLOOD GAS, ED
Acid-Base Excess: 1 mmol/L (ref 0.0–2.0)
Bicarbonate: 28.4 mmol/L — ABNORMAL HIGH (ref 20.0–28.0)
Calcium, Ion: 1.18 mmol/L (ref 1.15–1.40)
HCT: 42 % (ref 36.0–46.0)
Hemoglobin: 14.3 g/dL (ref 12.0–15.0)
O2 Saturation: 98 %
Potassium: 4.4 mmol/L (ref 3.5–5.1)
Sodium: 140 mmol/L (ref 135–145)
TCO2: 30 mmol/L (ref 22–32)
pCO2, Ven: 55.3 mmHg (ref 44.0–60.0)
pH, Ven: 7.319 (ref 7.250–7.430)
pO2, Ven: 119 mmHg — ABNORMAL HIGH (ref 32.0–45.0)

## 2020-08-27 LAB — RESP PANEL BY RT-PCR (FLU A&B, COVID) ARPGX2
Influenza A by PCR: NEGATIVE
Influenza B by PCR: NEGATIVE
SARS Coronavirus 2 by RT PCR: NEGATIVE

## 2020-08-27 LAB — RAPID URINE DRUG SCREEN, HOSP PERFORMED
Amphetamines: NOT DETECTED
Barbiturates: NOT DETECTED
Benzodiazepines: NOT DETECTED
Cocaine: POSITIVE — AB
Opiates: NOT DETECTED
Tetrahydrocannabinol: POSITIVE — AB

## 2020-08-27 LAB — I-STAT BETA HCG BLOOD, ED (MC, WL, AP ONLY): I-stat hCG, quantitative: <5 m[IU]/mL (ref <5)

## 2020-08-27 LAB — CBC
HCT: 41.1 % (ref 36.0–46.0)
Hemoglobin: 12.7 g/dL (ref 12.0–15.0)
MCH: 28.1 pg (ref 26.0–34.0)
MCHC: 30.9 g/dL (ref 30.0–36.0)
MCV: 90.9 fL (ref 80.0–100.0)
Platelets: 311 10*3/uL (ref 150–400)
RBC: 4.52 MIL/uL (ref 3.87–5.11)
RDW: 14.6 % (ref 11.5–15.5)
WBC: 5.7 10*3/uL (ref 4.0–10.5)
nRBC: 0 % (ref 0.0–0.2)

## 2020-08-27 LAB — BASIC METABOLIC PANEL
Anion gap: 11 (ref 5–15)
BUN: 9 mg/dL (ref 6–20)
CO2: 23 mmol/L (ref 22–32)
Calcium: 9.4 mg/dL (ref 8.9–10.3)
Chloride: 105 mmol/L (ref 98–111)
Creatinine, Ser: 0.89 mg/dL (ref 0.44–1.00)
GFR, Estimated: >60 mL/min (ref >60)
Glucose, Bld: 178 mg/dL — ABNORMAL HIGH (ref 70–99)
Potassium: 4.4 mmol/L (ref 3.5–5.1)
Sodium: 139 mmol/L (ref 135–145)

## 2020-08-27 LAB — URINALYSIS, ROUTINE W REFLEX MICROSCOPIC
Bilirubin Urine: NEGATIVE
Glucose, UA: NEGATIVE mg/dL
Hgb urine dipstick: NEGATIVE
Ketones, ur: NEGATIVE mg/dL
Leukocytes,Ua: NEGATIVE
Nitrite: NEGATIVE
Protein, ur: NEGATIVE mg/dL
Specific Gravity, Urine: 1.021 (ref 1.005–1.030)
pH: 5 (ref 5.0–8.0)

## 2020-08-27 MED ORDER — ACETAMINOPHEN 325 MG PO TABS: 650.0000 mg | ORAL_TABLET | Freq: Four times a day (QID) | ORAL | Status: DC | PRN

## 2020-08-27 MED ORDER — POLYETHYLENE GLYCOL 3350 17 G PO PACK: 17.0000 g | PACK | Freq: Every day | ORAL | Status: DC | PRN

## 2020-08-27 MED ORDER — ALBUTEROL SULFATE HFA 108 (90 BASE) MCG/ACT IN AERS: 2.0000 | INHALATION_SPRAY | RESPIRATORY_TRACT | Status: DC | PRN

## 2020-08-27 MED ORDER — PREDNISONE 20 MG PO TABS: 40.0000 mg | ORAL_TABLET | Freq: Every day | ORAL | Status: DC

## 2020-08-27 MED ORDER — PREDNISONE 20 MG PO TABS
40.0000 mg | ORAL_TABLET | Freq: Every day | ORAL | 0 refills | Status: DC
Start: 2020-08-28 — End: 2022-07-14

## 2020-08-27 MED ORDER — IPRATROPIUM-ALBUTEROL 0.5-2.5 (3) MG/3ML IN SOLN
3.0000 mL | Freq: Four times a day (QID) | RESPIRATORY_TRACT | Status: DC
  Administered 2020-08-27: 3 mL via RESPIRATORY_TRACT
  Filled 2020-08-27: qty 3

## 2020-08-27 MED ORDER — FLUTICASONE PROPIONATE HFA 110 MCG/ACT IN AERO: 2.0000 | INHALATION_SPRAY | Freq: Two times a day (BID) | RESPIRATORY_TRACT | 0 refills | Status: DC

## 2020-08-27 MED ORDER — IPRATROPIUM-ALBUTEROL 0.5-2.5 (3) MG/3ML IN SOLN: 3.0000 mL | Freq: Four times a day (QID) | RESPIRATORY_TRACT | Status: DC

## 2020-08-27 MED ORDER — APIXABAN 5 MG PO TABS: 5.0000 mg | ORAL_TABLET | Freq: Two times a day (BID) | ORAL | Status: DC

## 2020-08-27 MED ORDER — SACUBITRIL-VALSARTAN 97-103 MG PO TABS: 1.0000 | ORAL_TABLET | Freq: Two times a day (BID) | ORAL | Status: DC

## 2020-08-27 MED ORDER — ENOXAPARIN SODIUM 40 MG/0.4ML IJ SOSY: 40.0000 mg | PREFILLED_SYRINGE | INTRAMUSCULAR | Status: DC

## 2020-08-27 MED ORDER — ACETAMINOPHEN 650 MG RE SUPP: 650.0000 mg | Freq: Four times a day (QID) | RECTAL | Status: DC | PRN

## 2020-08-27 MED ORDER — METHYLPREDNISOLONE SODIUM SUCC 40 MG IJ SOLR
40.0000 mg | Freq: Once | INTRAMUSCULAR | Status: AC
  Administered 2020-08-27: 40 mg via INTRAVENOUS
  Filled 2020-08-27: qty 1

## 2020-08-27 MED ORDER — AMIODARONE HCL 200 MG PO TABS: 100.0000 mg | ORAL_TABLET | Freq: Every day | ORAL | Status: DC

## 2020-08-27 MED ORDER — SODIUM CHLORIDE 0.9% FLUSH
3.0000 mL | Freq: Two times a day (BID) | INTRAVENOUS | Status: DC
  Administered 2020-08-27: 3 mL via INTRAVENOUS

## 2020-08-27 MED ORDER — TIOTROPIUM BROMIDE-OLODATEROL 2.5-2.5 MCG/ACT IN AERS: 2.0000 | INHALATION_SPRAY | Freq: Every day | RESPIRATORY_TRACT | 0 refills | Status: DC

## 2020-08-27 NOTE — ED Notes (Signed)
Patient taken off Bipap, placed on [place] @ 3 LPM.

## 2020-08-27 NOTE — ED Notes (Signed)
MD at bedside, discussing with pt importance of being admitted as patient adamant on leaving.

## 2020-08-27 NOTE — Discharge Instructions (Signed)
Sabrina Holland were admitted to the hospital for a COPD flare.  I am worried that you are leaving before being medically stabilized, however we did discuss the risks including loss of consciousness and death.  If your symptoms of shortness of breath worsen, please call an ambulance or return to the ER as soon as possible.   Recommendations at this time:  - A prescription for an inhaler called Stiolto has been sent to your pharmacy.  Please use 2 puffs every day  - A prescription for an inhaler called Flovent has been sent to your pharmacy.  Please use 2 puffs every morning and every night.  - I have sent another prescription for prednisone as well to make sure you have enough.  Take 40 mg every day for 4 days starting tomorrow  - Continue to use your albuterol inhaler and nebulizer machine as needed for rescue  - Make an appointment with your primary care doctor soon as possible to check your oxygen levels and see if you need oxygen at home    It was a pleasure meeting you and I wish you the best.   - Dr. Charleen Kirks (Dr. Jacinto Reap)

## 2020-08-27 NOTE — ED Provider Notes (Signed)
[facility] EMERGENCY DEPARTMENT Provider Note   CSN: [id number] Arrival date & time: 08/27/20  [id number]     History Chief Complaint  Patient presents with   Shortness of Breath    Sabrina Holland is a 50 y.o. female.  Patient brought in by EMS.  Patient with a history of COPD.  With admissions for COPD exacerbations.  Most recent 1 was September 2021.  Patient brought in by EMS.  Apparently significant shortness of breath since 3 this morning.  EMS reported that respiratory rates were in the 40s.  EMS started her on CPAP.  She received albuterol and 125 mg Solu-Medrol.  Temp on arrival 97.8 pulse 76 respiration 17 blood pressure 167/108.  Oxygen sats were 100%.  Patient switched over to BiPAP here.  Patient comfortable a be drowsy but will wake up states that breathing feels better.  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  COPD.  Hypertension.      Past Medical History:  Diagnosis Date   Atrial flutter ([place]) 07/03/2018   Breast cancer, stage 2 ([place]) 02/23/2011   Chronic combined systolic and diastolic CHF (congestive heart failure) (HCC)    CKD (chronic kidney disease), stage II    Cocaine use    COPD (chronic obstructive pulmonary disease) ([place])    Homelessness    Hypertension    Polysubstance abuse ([place])    Poor social situation    TIA (transient ischemic attack) 11/25/2011   "first time" (11/25/2011)    Patient Active Problem List   Diagnosis Date Noted   Palliative care by specialist    Goals of care, counseling/discussion    Acute respiratory failure ([place])    Endotracheally intubated    Torsades de pointes ([place])    Cardiac arrest ([place])    Atrial flutter ([place]) 07/03/2018   Elevated LFTs 04/18/2018   Malnutrition of moderate degree 02/27/2018   Dyspnea 02/26/2018   Acute on chronic  systolic and diastolic heart failure, NYHA class 3 ([place]) 01/20/2018   Cocaine abuse ([place]) 12/19/2017   Mixed hyperlipidemia 12/19/2017   COPD exacerbation ([place])    Acute on chronic respiratory failure with hypoxemia ([place]) 12/03/2017   Cardiomyopathy ([place]) 07/04/2017   COPD (chronic obstructive pulmonary disease) ([place])    Chronic combined systolic and diastolic heart failure ([place])    Anemia 06/20/2017   Polysubstance abuse ([place]) 11/26/2011   TIA (transient ischemic attack) 11/25/2011   Essential hypertension 11/25/2011   Sinusitis 11/25/2011   Vitamin B 12 deficiency 07/18/2011   History of breast cancer 02/23/2011    Past Surgical History:  Procedure Laterality Date   BREAST BIOPSY  2007   left   MASTECTOMY  09/2005   left   RIGHT/LEFT HEART CATH AND CORONARY ANGIOGRAPHY N/A 07/10/2018   Procedure: RIGHT/LEFT HEART CATH AND CORONARY ANGIOGRAPHY;  Surgeon: Jolaine Artist, MD;  Location: [facility];  Service: Cardiovascular;  Laterality: N/A;   TONSILLECTOMY AND ADENOIDECTOMY     "when I was little" (11/25/2011)   TUBAL LIGATION  1993     OB History     Gravida  5   Para  4   Term  4   Preterm  0   AB  1   Living         SAB  1   IAB  0   Ectopic  0  Multiple      Live Births              Family History  Problem Relation Age of Onset   COPD Father    Sickle cell anemia Cousin        maternal cousin    Social History   Tobacco Use   Smoking status: Every Day    Packs/day: 5.00    Years: 30.00    Pack years: 150.00    Types: Cigarettes   Smokeless tobacco: Never   Tobacco comments:    1 or 2 cigarettes a day- approx 1 pack week  Vaping Use   Vaping Use: Never used  Substance Use Topics   Alcohol use: Yes    Alcohol/week: 12.0 standard drinks    Types: 12 Cans of beer per week    Comment: last drink was 5/19   Drug use: Yes    Types: Marijuana, Cocaine    Comment: daily marijuana; last use of cocaine 05/02/18 but selling it     Home Medications Prior to Admission medications   Medication Sig Start Date End Date Taking? Authorizing Provider  albuterol (VENTOLIN HFA) 108 (90 Base) MCG/ACT inhaler Inhale 1-2 puffs into the lungs every 6 (six) hours as needed for wheezing or shortness of breath (cough). Will pick up scripts tomorrow 7-9. 09/16/19   Volney American, PA-C  albuterol (VENTOLIN HFA) 108 (90 Base) MCG/ACT inhaler Inhale 1-2 puffs into the lungs every 6 (six) hours as needed for wheezing or shortness of breath. 07/20/20   Quintella Reichert, MD  amiodarone (PACERONE) 200 MG tablet Take 0.5 tablets (100 mg total) by mouth daily. 04/29/20   Bensimhon, Shaune Pascal, MD  carvedilol (COREG) 6.25 MG tablet Take 1.5 tablets (9.375 mg total) by mouth 2 (two) times daily. 04/29/20   Bensimhon, Shaune Pascal, MD  ELIQUIS 5 MG TABS tablet TAKE 1 TABLET BY MOUTH TWICE DAILY 07/23/19   Larey Dresser, MD  ferrous sulfate 325 (65 FE) MG tablet Take 1 tablet (325 mg total) by mouth 2 (two) times daily with a meal. 07/29/19   Gildardo Pounds, NP  predniSONE (DELTASONE) 10 MG tablet Take 4 tablets (40 mg total) by mouth daily. 07/20/20   Quintella Reichert, MD  sacubitril-valsartan (ENTRESTO) 97-103 MG Take 1 tablet by mouth 2 (two) times daily. 01/02/19   Bensimhon, Shaune Pascal, MD  sertraline (ZOLOFT) 100 MG tablet Take 1 tablet (100 mg total) by mouth daily. 10/08/19 01/06/20  Gildardo Pounds, NP  spironolactone (ALDACTONE) 25 MG tablet Take 1 tablet (25 mg total) by mouth daily. F/U with Cardiology for additional refills 07/29/19 11/06/19  Gildardo Pounds, NP  VITAMIN D, CHOLECALCIFEROL, PO Take 1 tablet by mouth once a week.    [provider]    Allergies    Penicillins  Review of Systems   Review of Systems  Unable to perform ROS: Severe respiratory distress   Physical Exam Updated Vital Signs BP (!) 151/101   Pulse 73   Temp 97.8 F (36.6 C)   Resp 17   LMP 02/18/2015 (Exact Date) Comment: MAYBE ONCE A YEAR  SpO2  100%   Physical Exam Vitals and nursing note reviewed.  Constitutional:      General: She is in acute distress.     Appearance: She is well-developed. She is ill-appearing.     Comments: Patient on BiPAP.  HENT:     Head: Normocephalic and atraumatic.  Eyes:  Extraocular Movements: Extraocular movements intact.     Conjunctiva/sclera: Conjunctivae normal.     Pupils: Pupils are equal, round, and reactive to light.  Cardiovascular:     Rate and Rhythm: Normal rate and regular rhythm.     Heart sounds: No murmur heard. Pulmonary:     Effort: Pulmonary effort is normal. No respiratory distress.     Breath sounds: Wheezing present.     Comments: Wheezing on the left clear on the right. Abdominal:     General: There is no distension.     Palpations: Abdomen is soft.     Tenderness: There is no abdominal tenderness.  Musculoskeletal:        General: No swelling.     Cervical back: Normal range of motion and neck supple.  Skin:    General: Skin is warm and dry.     Capillary Refill: Capillary refill takes less than 2 seconds.  Neurological:     Mental Status: She is alert.     Comments: Patient on BiPAP so difficult to communicate.  But moving all 4 extremities.    ED Results / Procedures / Treatments   Labs (all labs ordered are listed, but only abnormal results are displayed) Labs Reviewed  I-STAT VENOUS BLOOD GAS, ED - Abnormal; Notable for the following components:      Result Value   pO2, Ven 119.0 (*)    Bicarbonate 28.4 (*)    All other components within normal limits  RESP PANEL BY RT-PCR (FLU A&B, COVID) ARPGX2  CBC  BASIC METABOLIC PANEL  I-STAT BETA HCG BLOOD, ED ([facility], [facility], [facility] ONLY)    EKG EKG Interpretation  Date/Time:  Thursday August 27 2020 08:18:54 EDT Ventricular Rate:  90 PR Interval:  172 QRS Duration: 90 QT Interval:  425 QTC Calculation: 521 R Axis:   90 Text Interpretation: Sinus rhythm Right atrial enlargement Borderline right axis  deviation Probable left ventricular hypertrophy Nonspecific T abnrm, anterolateral leads Prolonged QT interval Confirmed by Fredia Sorrow [id number]) on 08/27/2020 8:26:04 AM  Radiology DG Chest Portable 1 View  Result Date: 08/27/2020 CLINICAL DATA:  Shortness of breath EXAM: PORTABLE CHEST 1 VIEW COMPARISON:  07/20/2020 FINDINGS: Stable emphysematous changes with marked hyperinflation. Stable calcified granuloma in the right upper lobe. No acute overlying pulmonary process is identified. No infiltrates, edema or effusions. No pulmonary lesions. The cardiac silhouette, mediastinal and hilar contours are within normal limits and unchanged. The bony thorax is intact. IMPRESSION: Emphysematous changes but no acute overlying pulmonary process. Electronically Signed   By: Marijo Sanes M.D.   On: 08/27/2020 09:01    Procedures Procedures   Medications Ordered in ED Medications  albuterol (VENTOLIN HFA) 108 (90 Base) MCG/ACT inhaler 2 puff (has no administration in time range)    ED Course  I have reviewed the triage vital signs and the nursing notes.  Pertinent labs & imaging results that were available during my care of the patient were reviewed by me and considered in my medical decision making (see chart for details).    MDM Rules/Calculators/A&P                          CRITICAL CARE Performed by: Fredia Sorrow Total critical care time: 45 minutes Critical care time was exclusive of separately billable procedures and treating other patients. Critical care was necessary to treat or prevent imminent or life-threatening deterioration. Critical care was time spent personally by me on the following  activities: development of treatment plan with patient and/or surrogate as well as nursing, discussions with consultants, evaluation of patient's response to treatment, examination of patient, obtaining history from patient or surrogate, ordering and performing treatments and interventions,  ordering and review of laboratory studies, ordering and review of radiographic studies, pulse oximetry and re-evaluation of patient's condition.  Patient brought in by EMS for presumed COPD exacerbation requiring initiation of CPAP by them patient now on BiPAP.  Venous blood gas pending.  Chest x-ray reviewed by me awaiting formal radiology interpretation.  No evidence of any acute findings.  No opacities no pneumothorax.  Not consistent with congestive heart failure.  Will add on urine drug screen to labs COVID testing.  Patient will require admission.  Patient's labs and chest x-ray formally read by radiology reassuring.  No evidence of any congestive heart failure.  Discussed with unassigned medicine for admission.  Would keep patient on BiPAP for now.  Venous blood gas also good pH 7.3 PCO2 was 55.  Which for venous blood gas is reassuring.  Final Clinical Impression(s) / ED Diagnoses Final diagnoses:  COPD exacerbation ([place])  Acute respiratory failure, unspecified whether with hypoxia or hypercapnia [facility])    Rx / DC Orders ED Discharge Orders     None        Fredia Sorrow, MD 08/27/20 [phone]

## 2020-08-27 NOTE — Discharge Summary (Signed)
Name: Sabrina Holland MRN: [id number] DOB: July 21, 1970 50 y.o. PCP: Gildardo Pounds, NP  Date of Admission: 08/27/2020  8:11 AM Date of Discharge: 08/27/2020 Attending Physician: Axel Filler, *  Discharge Diagnosis: 1. Acute COPD Exacerbation   Discharge Medications: Allergies as of 08/27/2020       Reactions   Penicillins Other (See Comments)   Pt had slight throat swelling Has patient had a PCN reaction causing immediate rash, facial/tongue/throat swelling, SOB or lightheadedness with hypotension: yes Has patient had a PCN reaction causing severe rash involving mucus membranes or skin necrosis: No Has patient had a PCN reaction that required hospitalization No Has patient had a PCN reaction occurring within the last 10 years: No If all of the above answers are "NO", then may proceed with Cephalosporin use.        Medication List     TAKE these medications    albuterol 108 (90 Base) MCG/ACT inhaler Commonly known as: VENTOLIN HFA Inhale 1-2 puffs into the lungs every 6 (six) hours as needed for wheezing or shortness of breath (cough). Will pick up scripts tomorrow 7-9.   albuterol 108 (90 Base) MCG/ACT inhaler Commonly known as: VENTOLIN HFA Inhale 1-2 puffs into the lungs every 6 (six) hours as needed for wheezing or shortness of breath.   amiodarone 200 MG tablet Commonly known as: PACERONE Take 0.5 tablets (100 mg total) by mouth daily.   carvedilol 6.25 MG tablet Commonly known as: COREG Take 1.5 tablets (9.375 mg total) by mouth 2 (two) times daily.   Eliquis 5 MG Tabs tablet Generic drug: apixaban TAKE 1 TABLET BY MOUTH TWICE DAILY What changed: how much to take   Entresto 97-103 MG Generic drug: sacubitril-valsartan Take 1 tablet by mouth 2 (two) times daily.   ferrous sulfate 325 (65 FE) MG tablet Take 1 tablet (325 mg total) by mouth 2 (two) times daily with a meal.   fluticasone 110 MCG/ACT inhaler Commonly known as: FLOVENT  HFA Inhale 2 puffs into the lungs in the morning and at bedtime. Rinse your mouth with water after each use.   predniSONE 20 MG tablet Commonly known as: DELTASONE Take 2 tablets (40 mg total) by mouth daily with breakfast. Start taking on: August 28, 2020 What changed:  medication strength when to take this   Tiotropium Bromide-Olodaterol 2.5-2.5 MCG/ACT Aers Inhale 2 puffs into the lungs daily.   Vitamin D (Ergocalciferol) 1.25 MG (50000 UNIT) Caps capsule Commonly known as: DRISDOL Take 50,000 Units by mouth once a week.        Disposition and follow-up:   Sabrina Holland was [facility] in Serious condition.  At the hospital follow up visit please address:  1.    COPD Exacerbation: Patient left prior to medical stabilization. On discharge, patient was started on triple therapy for COPD with severe symptoms and frequent exacerbations.  She will need PFTs for further evaluation.  Patient endorsed cocaine use disorder relapse.  She is very interested in therapy.  Please refer patient a follow-up visit.  2.  Labs / imaging needed at time of follow-up: Ambulatory oxygen saturation   3.  Pending labs/ test needing follow-up: None   Hospital Course by problem list: 1. Acute COPD Exacerbation:  Sabrina Holland endorses 2-day history of increased cough without sputum production as well as increased dyspnea that is exacerbated by exertion.  She has been using her albuterol inhaler and DuoNeb treatments more frequently without alleviation of  symptoms.  EMS was called to her home and she was placed on CPAP.  On arrival to the ED, patient was switched to BiPAP due to increased work of breathing with respiratory rates above 30.  VBG was obtained that showed pH of 7.3 and PCO2 of 55.  Sabrina Holland received IV Solu-Medrol and DuoNeb treatments.  She was able to be weaned from BiPAP to 3 L nasal cannula with stable oxygen saturations.  Given lack of  fever, leukocytosis, increased sputum production and a reassuring chest x-ray, antibiotic therapy was not indicated.  Unfortunately, patient insisted on leaving prior to medical stabilization.  She notes that she is on a point system for absences at work and she is close to losing her job.  I counseled her on the risks of leaving before stabilization, including but not limited to loss of consciousness and death.  Sabrina Holland expressed understanding but insisted she must leave.  She was discharged with prescriptions for Stiolto, Flovent and prednisone.  I strongly encouraged that she return to the ER should she develop any worsening shortness of breath, dizziness and she expressed understanding.  Pertinent Labs, Studies, and Procedures:   Lab Results  Component Value Date   WBC 5.7 08/27/2020   HGB 14.3 08/27/2020   HCT 42.0 08/27/2020   MCV 90.9 08/27/2020   PLT 311 08/27/2020   Lab Results  Component Value Date   NA 140 08/27/2020   K 4.4 08/27/2020   CO2 23 08/27/2020   GLUCOSE 178 (H) 08/27/2020   BUN 9 08/27/2020   CREATININE 0.89 08/27/2020   CALCIUM 9.4 08/27/2020   GFRNONAA >60 08/27/2020   GFRAA >60 10/04/2019   CXR:  FINDINGS: Stable emphysematous changes with marked hyperinflation. Stable calcified granuloma in the right upper lobe. No acute overlying pulmonary process is identified. No infiltrates, edema or effusions. No pulmonary lesions.   The cardiac silhouette, mediastinal and hilar contours are within normal limits and unchanged. The bony thorax is intact.   IMPRESSION: Emphysematous changes but no acute overlying pulmonary process.  Discharge Instructions: Discharge Instructions     Call MD for:  difficulty breathing, headache or visual disturbances   Complete by: As directed    Call MD for:  extreme fatigue   Complete by: As directed    Call MD for:  persistant dizziness or light-headedness   Complete by: As directed    Call MD for:  persistant  nausea and vomiting   Complete by: As directed    Call MD for:  severe uncontrolled pain   Complete by: As directed    Call MD for:  temperature >100.4   Complete by: As directed    Discharge instructions   Complete by: As directed    Sabrina Holland   You were admitted to the hospital for a COPD flare.  I am worried that you are leaving before being medically stabilized, however we did discuss the risks including loss of consciousness and death.  If your symptoms of shortness of breath worsen, please call an ambulance or return to the ER as soon as possible.  Recommendations at this time: - A prescription for an inhaler called Stiolto has been sent to your pharmacy.  Please use 2 puffs every day - A prescription for an inhaler called Flovent has been sent to your pharmacy.  Please use 2 puffs every morning and every night. - I have sent another prescription for prednisone as well to make sure you have enough.  Take 40  mg every day for 4 days starting tomorrow - Continue to use your albuterol inhaler and nebulizer machine as needed for rescue - Make an appointment with your primary care doctor soon as possible to check your oxygen levels and see if you need oxygen at home   It was a pleasure meeting you and I wish you the best.  - Dr. Charleen Kirks (Dr. B)   Increase activity slowly   Complete by: As directed        Signed: Dr. Jose Persia Internal Medicine PGY-3  Pager: [phone] After 5pm on weekdays and 1pm on weekends: On Call pager [phone]  08/27/2020, 2:52 PM

## 2020-08-27 NOTE — H&P (Addendum)
Date: 08/27/2020               Patient Name:  Sabrina Holland MRN: [id number]  DOB: Jan 08, 1971 Age / Sex: 50 y.o., female   PCP: Gildardo Pounds, NP         Medical Service: Internal Medicine Teaching Service         Attending Physician: Dr. Evette Doffing, Mallie Mussel, *    First Contact: Dr. Raymondo Band Pager: [phone]  Second Contact: Dr. Marva Panda Pager: [phone]       After Hours (After 5p/  First Contact Pager: [phone]  weekends / holidays): Second Contact Pager: [phone]   Chief Complaint: Shortness of breathe  History of Present Illness:   Sabrina Holland is a 50 y/o female with a past medical history of COPD, heart failure with last EF of 20 to 25% (2020), polysubstance abuse, hypertension, breast cancer who presents to the ED with complaints of shortness of breath.  Sabrina Holland states that she has been experiencing increasing cough that is nonproductive over the last couple days, however began to experience quite sudden onset shortness of breath this morning.  She feels it may have been triggered by her recent cocaine use as this has triggered her COPD before.  She also notes that she continues to use daily tobacco but is working on cessation.  She denies any fever, chills, chest pain, palpitations, leg edema nausea, vomiting, abdominal pain.  She denies any sick contacts. She has been using her nebulizer and albuterol inhalers more frequently in he past couple days without improvement in cough and it did not improve her SOB when it onset.   Sabrina Holland states she has been to rehabilitation before in the past for her cocaine use disorder but has never had regular therapy.  She is interested in pursuing this to help her with cessation.  ED Course:  On arrival to the ED, patient was switched from CPAP to BiPAP.  She was noted to be hypertensive at 167/108.  She was saturating at 100% on BiPAP with a respiratory rate of 17.  Patient was afebrile at 97.8.  VBG was obtained that  showed a pH of 7.3 with normal PCO2.  CBC was unremarkable.  Chest x-ray demonstrated emphysematous changes but no other acute abnormalities.  IMTS was consulted for admission.  Meds:  No current facility-administered medications on file prior to encounter.   Current Outpatient Medications on File Prior to Encounter  Medication Sig Dispense Refill   albuterol (VENTOLIN HFA) 108 (90 Base) MCG/ACT inhaler Inhale 1-2 puffs into the lungs every 6 (six) hours as needed for wheezing or shortness of breath (cough). Will pick up scripts tomorrow 7-9. 18 g 0   albuterol (VENTOLIN HFA) 108 (90 Base) MCG/ACT inhaler Inhale 1-2 puffs into the lungs every 6 (six) hours as needed for wheezing or shortness of breath. 8 g 0   amiodarone (PACERONE) 200 MG tablet Take 0.5 tablets (100 mg total) by mouth daily. 15 tablet 6   carvedilol (COREG) 6.25 MG tablet Take 1.5 tablets (9.375 mg total) by mouth 2 (two) times daily. 90 tablet 6   ELIQUIS 5 MG TABS tablet TAKE 1 TABLET BY MOUTH TWICE DAILY 180 tablet 3   ferrous sulfate 325 (65 FE) MG tablet Take 1 tablet (325 mg total) by mouth 2 (two) times daily with a meal. 180 tablet 3   predniSONE (DELTASONE) 10 MG tablet Take 4 tablets (40 mg total) by mouth daily. 16 tablet  0   sacubitril-valsartan (ENTRESTO) 97-103 MG Take 1 tablet by mouth 2 (two) times daily. 60 tablet 11   sertraline (ZOLOFT) 100 MG tablet Take 1 tablet (100 mg total) by mouth daily. 90 tablet 1   spironolactone (ALDACTONE) 25 MG tablet Take 1 tablet (25 mg total) by mouth daily. F/U with Cardiology for additional refills 30 tablet 0   VITAMIN D, CHOLECALCIFEROL, PO Take 1 tablet by mouth once a week.     Allergies: Allergies as of 08/27/2020 - Review Complete 08/27/2020  Allergen Reaction Noted   Penicillins Other (See Comments) 11/25/2011   Past Medical History:  Diagnosis Date   Atrial flutter ([place]) 07/03/2018   Breast cancer, stage 2 ([place]) 02/23/2011   Chronic combined systolic and  diastolic CHF (congestive heart failure) ([facility])    CKD (chronic kidney disease), stage II    Cocaine use    COPD (chronic obstructive pulmonary disease) ([place])    Homelessness    Hypertension    Polysubstance abuse ([place])    Poor social situation    TIA (transient ischemic attack) 11/25/2011   "first time" (11/25/2011)   Family History:  Family History  Problem Relation Age of Onset   COPD Father    Sickle cell anemia Cousin        maternal cousin   Social History:  - Sabrina Holland lives in [place] - She is independent in her ADLs - She currently smokes approximately half a pack of tobacco per day - She endorses polysubstance use including recent cocaine use - We did not discuss previous alcohol use  Review of Systems: A complete ROS was negative except as per HPI.   Physical Exam: Blood pressure (!) 176/115, pulse 87, temperature 97.8 F (36.6 C), resp. rate 20, last menstrual period 02/18/2015, SpO2 98 %.  Physical Exam Vitals and nursing note reviewed.  Constitutional:      General: She is awake. She is not in acute distress.    Appearance: She is underweight.  HENT:     Head: Normocephalic and atraumatic.     Mouth/Throat:     Mouth: Mucous membranes are dry.     Pharynx: Oropharynx is clear.  Eyes:     Extraocular Movements: Extraocular movements intact.     Pupils: Pupils are equal, round, and reactive to light.  Neck:     Vascular: No JVD.  Cardiovascular:     Rate and Rhythm: Normal rate and regular rhythm.     Heart sounds: No murmur heard.   No gallop.  Pulmonary:     Effort: Pulmonary effort is normal. No accessory muscle usage or respiratory distress.     Breath sounds: Decreased breath sounds (decreased breathe sounds in bilateral middle and lower lung fields) and wheezing (diffuse faint wheezing heard in all lung fields) present. No rhonchi or rales.     Comments: Oxygen saturation at 100 % on BiPAP Abdominal:     General: There is no distension.      Palpations: Abdomen is soft.     Tenderness: There is no abdominal tenderness.  Musculoskeletal:     Cervical back: Normal range of motion.     Right lower leg: No tenderness. No edema.     Left lower leg: No tenderness. No edema.  Skin:    General: Skin is warm and dry.  Neurological:     General: No focal deficit present.     Mental Status: She is alert and oriented to person, place, and time.  Motor: No weakness.  Psychiatric:        Mood and Affect: Mood normal.        Behavior: Behavior normal. Behavior is cooperative.   EKG: personally reviewed my interpretation is: Sinus rhythm with rate of 90. Right axis deviation. Peaked P waves consistent with right atrial enlargement.   CXR: personally reviewed my interpretation is: Flattening of the diaphragm consistent with COPD.  No pleural effusions or infiltrates noted.  Assessment & Plan by Problem: Active Problems:   COPD with acute exacerbation [facility])  Sabrina Holland is a 50 year old female with a past medical history of COPD, HFrEF currently presenting with shortness of breath consistent with COPD exacerbation.  # COPD Exacerbation  Patient presenting with increasing dyspnea and cough with pulmonary examination significant for diffuse wheezing and decrease breathe sounds. No evidence of infiltrates on chest xray to suggest pneumonia. Given lack of fever, leukocytosis, productive cough and reassuring chest xray, will hold off on initiating antibiotics.   No previous PFTs in the chart. She mentions using an inhaler daily but none currently on medication list other than PRN albuterol and Duonebs. Given exacerbations have been occurring yearly since 2019, she will need to be initiated on maintenance inhalers prior to discharge, in addition to obtaining PFTs in 4-6 weeks.   - Admit to progressive floor - DuoNebs every 6 hours - Solu-Medrol 40 mg IV once - Start prednisone 40 mg p.o. tomorrow a.m. - Continue BiPAP.  Wean to  nasal cannula when able - Obtain ABG 1 hour after weaning off BiPAP  # HFrEF  # Non-Ischemic Cardiomyopathy  # Hypertension Patient follows up with Dr. Haroldine Laws at the heart failure clinic with last visit in April 2022.  Initial diagnosis of heart failure 2019 at which time her echo showed EF of 25 to 30% with severe global hypokinesis. EF further reduced to 10-15% in 2020 requiring admission with Milrinone gtt complicated by V. Tach arrest. R/L heart cath at the time showed normal coronary arteries. Bedside echo at appointment in April 2022 showed recovered EF up to 45-50%. Currently on GDMT: Entresto, Spironolactone, Coreg (however not taking consistently).   On examination today, there is no evidence of hypervolemia.  Will plan to add back her HF/HTN medications slowly to avoid development of hypotension.  - Restart Entresto 97-103 mg twice daily - Restart Coreg tomorrow a.m. - Restart spironolactone tomorrow a.m.  # Atrial Flutter EKG with sinus rhythm.   - Resume home amiodarone 100 mg daily - Resume home Eliquis 5 mg twice daily  # Polysubstance Abuse  - TOC consult for resources.  Patient states she is interested in therapy sessions.   Diet: NPO VTE: Enoxaparin IVF: None,None Code: Full  Prior to Admission Living Arrangement: Home Anticipated Discharge Location: Home Barriers to Discharge: Continued medical evaluation and stabilization   Dispo: Admit patient to Observation with expected length of stay less than 2 midnights.  Signed: Dr. Jose Persia Internal Medicine PGY-3 Pager: [phone] After 5pm on weekdays and 1pm on weekends: On Call pager [phone]  08/27/2020, 11:43 AM

## 2020-08-27 NOTE — Telephone Encounter (Signed)
Medication discontinued on 08/27/20 patient preference

## 2020-08-27 NOTE — ED Triage Notes (Addendum)
Pt BIB [facility] from home. Complaint of shortness of breath since 3 am waited to see if it improved. RR 40s upon EMS arrival. Hx of COPD, CHF, chronic smoker. Received 2.5 albuterol, 125 solumedrol via EMS. Pt placed on Bipap by EMS. Per EMS pt feeling somewhat better since.

## 2020-08-27 NOTE — ED Notes (Signed)
Patient discharge instructions reviewed with the patient. The patient verbalized understanding of instructions. Patient discharged. 

## 2020-08-28 ENCOUNTER — Telehealth: Payer: Self-pay

## 2020-08-28 NOTE — Telephone Encounter (Signed)
Transition Care Management Unsuccessful Follow-up Telephone Call  Date of discharge and from where:  08/27/2020-[place]   Attempts:  1st Attempt  Reason for unsuccessful TCM follow-up call:  Left voice message

## 2020-08-31 NOTE — Telephone Encounter (Signed)
Transition Care Management Unsuccessful Follow-up Telephone Call  Date of discharge and from where:  08/27/2020-[facility]   Attempts:  2nd Attempt  Reason for unsuccessful TCM follow-up call:  Left voice message

## 2020-09-01 NOTE — Telephone Encounter (Signed)
Transition Care Management Unsuccessful Follow-up Telephone Call  Date of discharge and from where:  08/27/2020 from [facility]  Attempts:  3rd Attempt  Reason for unsuccessful TCM follow-up call:  Unable to reach patient

## 2020-10-24 ENCOUNTER — Other Ambulatory Visit: Payer: Self-pay | Admitting: Nurse Practitioner

## 2021-01-23 ENCOUNTER — Observation Stay (HOSPITAL_COMMUNITY)
Admission: EM | Admit: 2021-01-23 | Discharge: 2021-01-24 | Disposition: A | Discharge status: Home / Self Care | Payer: Medicaid Other | Attending: Family Medicine | Admitting: Family Medicine

## 2021-01-23 ENCOUNTER — Emergency Department (HOSPITAL_COMMUNITY): Payer: Medicaid Other

## 2021-01-23 ENCOUNTER — Other Ambulatory Visit: Payer: Self-pay

## 2021-01-23 DIAGNOSIS — J9612 Chronic respiratory failure with hypercapnia: Secondary | ICD-10-CM | POA: Diagnosis not present

## 2021-01-23 DIAGNOSIS — R0602 Shortness of breath: Secondary | ICD-10-CM | POA: Diagnosis not present

## 2021-01-23 DIAGNOSIS — I110 Hypertensive heart disease with heart failure: Secondary | ICD-10-CM | POA: Insufficient documentation

## 2021-01-23 DIAGNOSIS — J441 Chronic obstructive pulmonary disease with (acute) exacerbation: Secondary | ICD-10-CM | POA: Diagnosis not present

## 2021-01-23 DIAGNOSIS — Z20822 Contact with and (suspected) exposure to covid-19: Secondary | ICD-10-CM | POA: Insufficient documentation

## 2021-01-23 DIAGNOSIS — J439 Emphysema, unspecified: Secondary | ICD-10-CM | POA: Diagnosis not present

## 2021-01-23 DIAGNOSIS — Z7901 Long term (current) use of anticoagulants: Secondary | ICD-10-CM | POA: Diagnosis not present

## 2021-01-23 DIAGNOSIS — Z79899 Other long term (current) drug therapy: Secondary | ICD-10-CM | POA: Diagnosis not present

## 2021-01-23 DIAGNOSIS — R000 Tachycardia, unspecified: Secondary | ICD-10-CM | POA: Diagnosis not present

## 2021-01-23 DIAGNOSIS — I4891 Unspecified atrial fibrillation: Secondary | ICD-10-CM | POA: Diagnosis not present

## 2021-01-23 DIAGNOSIS — J449 Chronic obstructive pulmonary disease, unspecified: Secondary | ICD-10-CM | POA: Insufficient documentation

## 2021-01-23 DIAGNOSIS — I5040 Unspecified combined systolic (congestive) and diastolic (congestive) heart failure: Secondary | ICD-10-CM | POA: Insufficient documentation

## 2021-01-23 DIAGNOSIS — I10 Essential (primary) hypertension: Secondary | ICD-10-CM | POA: Diagnosis not present

## 2021-01-23 LAB — CBC WITH DIFFERENTIAL/PLATELET
Abs Immature Granulocytes: 0.01 10*3/uL (ref 0.00–0.07)
Basophils Absolute: 0.1 10*3/uL (ref 0.0–0.1)
Basophils Relative: 1 %
Eosinophils Absolute: 0.3 10*3/uL (ref 0.0–0.5)
Eosinophils Relative: 4 %
HCT: 40.9 % (ref 36.0–46.0)
Hemoglobin: 13 g/dL (ref 12.0–15.0)
Immature Granulocytes: 0 %
Lymphocytes Relative: 40 %
Lymphs Abs: 2.7 10*3/uL (ref 0.7–4.0)
MCH: 27.5 pg (ref 26.0–34.0)
MCHC: 31.8 g/dL (ref 30.0–36.0)
MCV: 86.7 fL (ref 80.0–100.0)
Monocytes Absolute: 0.9 10*3/uL (ref 0.1–1.0)
Monocytes Relative: 13 %
Neutro Abs: 2.8 10*3/uL (ref 1.7–7.7)
Neutrophils Relative %: 42 %
Platelets: 345 10*3/uL (ref 150–400)
RBC: 4.72 MIL/uL (ref 3.87–5.11)
RDW: 13.3 % (ref 11.5–15.5)
WBC: 6.6 10*3/uL (ref 4.0–10.5)
nRBC: 0 % (ref 0.0–0.2)

## 2021-01-23 LAB — I-STAT ARTERIAL BLOOD GAS, ED
Acid-Base Excess: 2 mmol/L (ref 0.0–2.0)
Bicarbonate: 29.3 mmol/L — ABNORMAL HIGH (ref 20.0–28.0)
Calcium, Ion: 1.29 mmol/L (ref 1.15–1.40)
HCT: 38 % (ref 36.0–46.0)
Hemoglobin: 12.9 g/dL (ref 12.0–15.0)
O2 Saturation: 98 %
Potassium: 4.2 mmol/L (ref 3.5–5.1)
Sodium: 140 mmol/L (ref 135–145)
TCO2: 31 mmol/L (ref 22–32)
pCO2 arterial: 53.5 mmHg — ABNORMAL HIGH (ref 32.0–48.0)
pH, Arterial: 7.346 — ABNORMAL LOW (ref 7.350–7.450)
pO2, Arterial: 119 mmHg — ABNORMAL HIGH (ref 83.0–108.0)

## 2021-01-23 MED ORDER — METHYLPREDNISOLONE SODIUM SUCC 125 MG IJ SOLR
125.0000 mg | Freq: Once | INTRAMUSCULAR | Status: AC
  Administered 2021-01-23: 125 mg via INTRAVENOUS
  Filled 2021-01-23: qty 2

## 2021-01-23 MED ORDER — MORPHINE SULFATE (PF) 4 MG/ML IV SOLN
4.0000 mg | Freq: Once | INTRAVENOUS | Status: AC
  Administered 2021-01-23: 4 mg via INTRAVENOUS
  Filled 2021-01-23: qty 1

## 2021-01-23 MED ORDER — MAGNESIUM SULFATE 2 GM/50ML IV SOLN
2.0000 g | Freq: Once | INTRAVENOUS | Status: AC
  Administered 2021-01-23: 2 g via INTRAVENOUS
  Filled 2021-01-23: qty 50

## 2021-01-23 MED ORDER — ALBUTEROL SULFATE (2.5 MG/3ML) 0.083% IN NEBU
20.0000 mg/h | INHALATION_SOLUTION | Freq: Once | RESPIRATORY_TRACT | Status: AC
  Administered 2021-01-23: 20 mg/h via RESPIRATORY_TRACT

## 2021-01-23 MED ORDER — IPRATROPIUM BROMIDE 0.02 % IN SOLN
0.5000 mg | Freq: Once | RESPIRATORY_TRACT | Status: AC
  Administered 2021-01-23: 0.5 mg via RESPIRATORY_TRACT
  Filled 2021-01-23: qty 2.5

## 2021-01-23 NOTE — ED Provider Notes (Signed)
[facility] EMERGENCY DEPARTMENT Provider Note   CSN: [id number] Arrival date & time: 01/23/21  2258     History  Chief Complaint  Patient presents with   Shortness of Breath    Sabrina Holland is a 51 y.o. female.  The history is provided by the patient and the EMS personnel.  Shortness of Breath She has history of hypertension, hyperlipidemia, combined systolic and diastolic heart failure, COPD, atrial flutter anticoagulated on apixaban and comes in because of worsening dyspnea over the last 2 days.  She states that she ran out of her medications 3 days ago.  She has been using her nebulizer without relief.  There has been a cough productive of a small amount of clear sputum.  She denies fever, chills, sweats.  She has felt tight in her chest.  There has been no nausea, vomiting, diarrhea.  EMS noted oxygen saturation of 93% on room air, gave her nitroglycerin which she states did not help her breathing or chest discomfort but gave her a headache.  Of note, she does endorse history of having to be intubated for her breathing difficulty.    Home Medications Prior to Admission medications   Medication Sig Start Date End Date Taking? Authorizing Provider  albuterol (VENTOLIN HFA) 108 (90 Base) MCG/ACT inhaler Inhale 1-2 puffs into the lungs every 6 (six) hours as needed for wheezing or shortness of breath (cough). Will pick up scripts tomorrow 7-9. 09/16/19   Volney American, PA-C  albuterol (VENTOLIN HFA) 108 (90 Base) MCG/ACT inhaler Inhale 1-2 puffs into the lungs every 6 (six) hours as needed for wheezing or shortness of breath. 07/20/20   Quintella Reichert, MD  amiodarone (PACERONE) 200 MG tablet Take 0.5 tablets (100 mg total) by mouth daily. 04/29/20   Bensimhon, Shaune Pascal, MD  carvedilol (COREG) 6.25 MG tablet Take 1.5 tablets (9.375 mg total) by mouth 2 (two) times daily. 04/29/20   Bensimhon, Shaune Pascal, MD  ELIQUIS 5 MG TABS tablet TAKE 1 TABLET BY MOUTH  TWICE DAILY Patient taking differently: Take 5 mg by mouth 2 (two) times daily. 07/23/19   Larey Dresser, MD  ferrous sulfate 325 (65 FE) MG tablet Take 1 tablet (325 mg total) by mouth 2 (two) times daily with a meal. 07/29/19   Gildardo Pounds, NP  fluticasone (FLOVENT HFA) 110 MCG/ACT inhaler Inhale 2 puffs into the lungs in the morning and at bedtime. Rinse your mouth with water after each use. 08/27/20   Jose Persia, MD  predniSONE (DELTASONE) 20 MG tablet Take 2 tablets (40 mg total) by mouth daily with breakfast. 08/28/20   Jose Persia, MD  sacubitril-valsartan (ENTRESTO) 97-103 MG Take 1 tablet by mouth 2 (two) times daily. 01/02/19   Bensimhon, Shaune Pascal, MD  Tiotropium Bromide-Olodaterol 2.5-2.5 MCG/ACT AERS Inhale 2 puffs into the lungs daily. 08/27/20   Jose Persia, MD  Vitamin D, Ergocalciferol, (DRISDOL) 1.25 MG (50000 UNIT) CAPS capsule Take 50,000 Units by mouth once a week. 07/21/20   [provider]      Allergies    Penicillins    Review of Systems   Review of Systems  Respiratory:  Positive for shortness of breath.   All other systems reviewed and are negative.  Physical Exam Updated Vital Signs BP (!) 169/101    Pulse (!) 106    Resp (!) 22    LMP 02/18/2015 (Exact Date) Comment: MAYBE ONCE A YEAR   SpO2 97%  Physical Exam Vitals  and nursing note reviewed.  51 year old female in moderate respiratory distress distress, using accessory muscles of respiration, but able to complete sentences without stopping for breath. Vital signs are significant for elevated heart rate, respiratory rate, blood pressure. Oxygen saturation is 91%, which is normal. Head is normocephalic and atraumatic. PERRLA, EOMI. Oropharynx is clear. Neck is nontender and supple without adenopathy or JVD. Back is nontender and there is no CVA tenderness. Lungs have markedly decreased breath sounds diffusely with some faint expiratory wheezes heard at the bases.  There are no rales or  rhonchi. Chest is nontender. Heart has regular rate and rhythm without murmur. Abdomen is soft, flat, nontender. Extremities have no cyanosis or edema, full range of motion is present. Skin is warm and dry without rash. Neurologic: Mental status is normal, cranial nerves are intact, moves all extremities equally.  ED Results / Procedures / Treatments   Labs (all labs ordered are listed, but only abnormal results are displayed) Labs Reviewed  BASIC METABOLIC PANEL - Abnormal; Notable for the following components:      Result Value   Glucose, Bld 125 (*)    All other components within normal limits  BRAIN NATRIURETIC PEPTIDE - Abnormal; Notable for the following components:   B Natriuretic Peptide 115.9 (*)    All other components within normal limits  I-STAT ARTERIAL BLOOD GAS, ED - Abnormal; Notable for the following components:   pH, Arterial 7.346 (*)    pCO2 arterial 53.5 (*)    pO2, Arterial 119 (*)    Bicarbonate 29.3 (*)    All other components within normal limits  RESP PANEL BY RT-PCR (FLU A&B, COVID) ARPGX2  CBC WITH DIFFERENTIAL/PLATELET  MAGNESIUM  TROPONIN I (HIGH SENSITIVITY)  TROPONIN I (HIGH SENSITIVITY)    EKG EKG Interpretation  Date/Time:  Saturday January 23 2021 23:07:10 EST Ventricular Rate:  108 PR Interval:  164 QRS Duration: 73 QT Interval:  346 QTC Calculation: 464 R Axis:   90 Text Interpretation: Sinus tachycardia Biatrial enlargement Left ventricular hypertrophy Anterior infarct, old Abnrm T, consider ischemia, anterolateral lds Artifact in lead(s) I II III aVR aVL aVF V1 V2 V3 V4 V5 V6 When compared with ECG of 08/27/2020, No significant change was found Confirmed by Delora Fuel ([id number]) on 01/23/2021 11:08:34 PM  Radiology DG Chest Port 1 View  Result Date: 01/23/2021 CLINICAL DATA:  Shortness of breath EXAM: PORTABLE CHEST 1 VIEW COMPARISON:  08/27/2020, CT chest 11/07/2017 FINDINGS: Hyperinflation with emphysema. No focal airspace disease or  pleural effusion. Stable cardiomediastinal silhouette. No pneumothorax. IMPRESSION: Hyperinflation with emphysema.  No acute airspace disease. Electronically Signed   By: Donavan Foil M.D.   On: 01/23/2021 23:30    Procedures Procedures  Cardiac monitor showed sinus tachycardia as interpreted by me.  Medications Ordered in ED Medications  ipratropium (ATROVENT) nebulizer solution 0.5 mg (0.5 mg Nebulization Given 01/23/21 2325)  albuterol (PROVENTIL) (2.5 MG/3ML) 0.083% nebulizer solution (20 mg/hr Nebulization Given 01/23/21 2325)  morphine 4 MG/ML injection 4 mg (4 mg Intravenous Given 01/23/21 2334)  methylPREDNISolone sodium succinate (SOLU-MEDROL) 125 mg/2 mL injection 125 mg (125 mg Intravenous Given 01/23/21 2350)  magnesium sulfate IVPB 2 g 50 mL (0 g Intravenous Stopped 01/24/21 0051)  ipratropium-albuterol (DUONEB) 0.5-2.5 (3) MG/3ML nebulizer solution 3 mL (3 mLs Nebulization Given 01/24/21 0341)    ED Course/ Medical Decision Making/ A&P  Medical Decision Making  Severe dyspnea and patient with history of both CHF and COPD.  On my exam, findings are more suggestive of COPD.  She is working hard to breathe and will be placed on BiPAP.  Will check chest x-ray and BNP as well as ABG and troponin.  She will be given albuterol with ipratropium via nebulizer through BiPAP.  Old records are reviewed showing hospitalizations for both heart failure and COPD.  Echocardiogram on 10/18/2018 showed ejection fraction of 73-53% with diastolic dysfunction.  Cardiac catheterization on 06/20/2018 showed normal coronary arteries.  ECG shows sinus tachycardia but no acute changes and unchanged from prior ECG.  12:43 AM Chest x-ray shows hyperinflation.  Images are independently viewed by myself and agree with radiologist interpretation.  ABG shows normal pH with PCO2 of 53.5.  This is probably her baseline.  Metabolic panel is unremarkable as is CBC.  Initial troponin is normal, BNP  pending.  Respiratory pathogen panel is negative for influenza and COVID-19.  Patient was reevaluated and she is feeling much better.  She is no longer using accessory muscles of respiration and feels like she is back at her baseline.  We will discontinue BiPAP and reassess.  2:29 AM She did well off of BiPAP, but when oxygen was withdrawn, oxygen saturation dropped to 88%.  She is not felt to be safe for discharge.  I have informed her of this, but she states that she will not be admitted.  Risks of leaving were discussed including risk of death.  She expresses understanding.  We will proceed with attempt to admit, but she may choose to leave [place].  Patient has decided to stay.  She started to get more dyspneic and is given an nebulizer treatment with albuterol and ipratropium.  Case is discussed with Dr. Hal Hope of [facility], who agrees to admit the patient.  CRITICAL CARE Performed by: Delora Fuel Total critical care time: 95 minutes Critical care time was exclusive of separately billable procedures and treating other patients. Critical care was necessary to treat or prevent imminent or life-threatening deterioration. Critical care was time spent personally by me on the following activities: development of treatment plan with patient and/or surrogate as well as nursing, discussions with consultants, evaluation of patient's response to treatment, examination of patient, obtaining history from patient or surrogate, ordering and performing treatments and interventions, ordering and review of laboratory studies, ordering and review of radiographic studies, pulse oximetry and re-evaluation of patient's condition.       Final Clinical Impression(s) / ED Diagnoses Final diagnoses:  None    Rx / DC Orders ED Discharge Orders     None         Delora Fuel, MD 29/92/42 [phone]

## 2021-01-23 NOTE — ED Notes (Signed)
Warm blankets placed over pt.

## 2021-01-23 NOTE — ED Triage Notes (Signed)
BIB [facility] for SOB/difficulty breathing for 2 days now, pt reports being out of home meds for 3 days. 93% on RA, hx of CHF/COPD. Also c/o right-sided side pain. Home neb did not help. Was given 0.4 nitro by EMS. 20G IV in right hand.

## 2021-01-24 LAB — BASIC METABOLIC PANEL
Anion gap: 8 (ref 5–15)
BUN: 10 mg/dL (ref 6–20)
CO2: 28 mmol/L (ref 22–32)
Calcium: 9.3 mg/dL (ref 8.9–10.3)
Chloride: 108 mmol/L (ref 98–111)
Creatinine, Ser: 0.69 mg/dL (ref 0.44–1.00)
GFR, Estimated: >60 mL/min (ref >60)
Glucose, Bld: 125 mg/dL — ABNORMAL HIGH (ref 70–99)
Potassium: 4.5 mmol/L (ref 3.5–5.1)
Sodium: 144 mmol/L (ref 135–145)

## 2021-01-24 LAB — TROPONIN I (HIGH SENSITIVITY)
Troponin I (High Sensitivity): 9 ng/L (ref <18)
Troponin I (High Sensitivity): 9 ng/L (ref <18)

## 2021-01-24 LAB — RESP PANEL BY RT-PCR (FLU A&B, COVID) ARPGX2
Influenza A by PCR: NEGATIVE
Influenza B by PCR: NEGATIVE
SARS Coronavirus 2 by RT PCR: NEGATIVE

## 2021-01-24 LAB — BRAIN NATRIURETIC PEPTIDE: B Natriuretic Peptide: 115.9 pg/mL — ABNORMAL HIGH (ref 0.0–100.0)

## 2021-01-24 MED ORDER — IPRATROPIUM-ALBUTEROL 0.5-2.5 (3) MG/3ML IN SOLN
3.0000 mL | Freq: Once | RESPIRATORY_TRACT | Status: AC
  Administered 2021-01-24: 3 mL via RESPIRATORY_TRACT
  Filled 2021-01-24: qty 3

## 2021-01-24 NOTE — ED Notes (Signed)
Pt removed from BiPAP and placed on 2L nasal cannula. Pt states that she is feeling much better and breathing much easier. Oxygen saturation 95%.

## 2021-01-24 NOTE — ED Notes (Signed)
AVS papers given to pt. This RN explained that AVS papers can be shown at pt's work for proof of being in the hospital & this RN also explained that the ED cannot provide a doctor's note due to her choosing to leave AMA.

## 2021-01-24 NOTE — ED Notes (Signed)
Pt repeatedly stated that she wants to leave against medical advice despite discussing the importance of admission with this RN, Dr. Roxanne Mins (EDP), and Dr. Hal Hope (admitting provider). Dr. Hal Hope made aware of pt's decision.

## 2021-01-25 ENCOUNTER — Telehealth: Payer: Self-pay

## 2021-01-25 NOTE — Telephone Encounter (Signed)
Transition Care Management Unsuccessful Follow-up Telephone Call  Date of discharge and from where:  01/24/2021 from [facility]  Attempts:  1st Attempt  Reason for unsuccessful TCM follow-up call:  Left voice message

## 2021-01-26 NOTE — Telephone Encounter (Signed)
Transition Care Management Unsuccessful Follow-up Telephone Call  Date of discharge and from where:  01/24/2021 from [facility]  Attempts:  2nd Attempt  Reason for unsuccessful TCM follow-up call:  Left voice message

## 2021-01-27 NOTE — Telephone Encounter (Signed)
Transition Care Management Unsuccessful Follow-up Telephone Call  Date of discharge and from where:  01/24/2021-[place]   Attempts:  3rd Attempt  Reason for unsuccessful TCM follow-up call:  Left voice message

## 2021-01-30 DIAGNOSIS — R739 Hyperglycemia, unspecified: Secondary | ICD-10-CM | POA: Diagnosis not present

## 2021-01-30 DIAGNOSIS — R404 Transient alteration of awareness: Secondary | ICD-10-CM | POA: Diagnosis not present

## 2021-01-30 DIAGNOSIS — I469 Cardiac arrest, cause unspecified: Secondary | ICD-10-CM | POA: Diagnosis not present

## 2021-01-30 DIAGNOSIS — I499 Cardiac arrhythmia, unspecified: Secondary | ICD-10-CM | POA: Diagnosis not present

## 2021-02-17 DIAGNOSIS — 419620001 Death: Secondary | SNOMED CT | POA: Diagnosis not present

## 2021-02-17 DEATH — deceased
